# Patient Record
Sex: Female | Born: 1945
Health system: Southern US, Community
[De-identification: ages and names within clinical notes are randomized; demographics above are authoritative.]

## PROBLEM LIST (undated history)

## (undated) DIAGNOSIS — I050 Rheumatic mitral stenosis: Secondary | ICD-10-CM

## (undated) DIAGNOSIS — D75829 Heparin-induced thrombocytopenia, unspecified: Secondary | ICD-10-CM

## (undated) DIAGNOSIS — M1990 Unspecified osteoarthritis, unspecified site: Secondary | ICD-10-CM

## (undated) DIAGNOSIS — D7582 Heparin induced thrombocytopenia (HIT): Secondary | ICD-10-CM

## (undated) DIAGNOSIS — D6862 Lupus anticoagulant syndrome: Secondary | ICD-10-CM

## (undated) DIAGNOSIS — I5030 Unspecified diastolic (congestive) heart failure: Secondary | ICD-10-CM

## (undated) DIAGNOSIS — I743 Embolism and thrombosis of arteries of the lower extremities: Secondary | ICD-10-CM

## (undated) DIAGNOSIS — I2699 Other pulmonary embolism without acute cor pulmonale: Secondary | ICD-10-CM

## (undated) DIAGNOSIS — I10 Essential (primary) hypertension: Secondary | ICD-10-CM

## (undated) DIAGNOSIS — I63511 Cerebral infarction due to unspecified occlusion or stenosis of right middle cerebral artery: Secondary | ICD-10-CM

## (undated) DIAGNOSIS — I509 Heart failure, unspecified: Secondary | ICD-10-CM

## (undated) DIAGNOSIS — F329 Major depressive disorder, single episode, unspecified: Secondary | ICD-10-CM

## (undated) DIAGNOSIS — Z86718 Personal history of other venous thrombosis and embolism: Secondary | ICD-10-CM

## (undated) DIAGNOSIS — F32A Depression, unspecified: Secondary | ICD-10-CM

## (undated) DIAGNOSIS — I4821 Permanent atrial fibrillation: Secondary | ICD-10-CM

## (undated) DIAGNOSIS — D2122 Benign neoplasm of connective and other soft tissue of left lower limb, including hip: Secondary | ICD-10-CM

## (undated) DIAGNOSIS — E669 Obesity, unspecified: Secondary | ICD-10-CM

## (undated) HISTORY — DX: Major depressive disorder, single episode, unspecified: F32.9

## (undated) HISTORY — DX: Depression, unspecified: F32.A

## (undated) HISTORY — DX: Obesity, unspecified: E66.9

## (undated) HISTORY — DX: Heparin-induced thrombocytopenia, unspecified: D75.829

## (undated) HISTORY — DX: Cerebral infarction due to unspecified occlusion or stenosis of right middle cerebral artery: I63.511

## (undated) HISTORY — DX: Heparin induced thrombocytopenia (HIT): D75.82

## (undated) HISTORY — DX: Heart failure, unspecified: I50.9

## (undated) HISTORY — PX: OTHER SURGICAL HISTORY: SHX169

## (undated) HISTORY — DX: Essential (primary) hypertension: I10

## (undated) HISTORY — DX: Other pulmonary embolism without acute cor pulmonale: I26.99

---

## 2005-08-11 ENCOUNTER — Ambulatory Visit (HOSPITAL_COMMUNITY): Admission: RE | Admit: 2005-08-11 | Discharge: 2005-08-11 | Payer: Self-pay | Admitting: Internal Medicine

## 2005-08-23 ENCOUNTER — Ambulatory Visit (HOSPITAL_COMMUNITY): Admission: RE | Admit: 2005-08-23 | Discharge: 2005-08-23 | Payer: Self-pay | Admitting: Internal Medicine

## 2005-08-29 ENCOUNTER — Ambulatory Visit: Payer: Self-pay | Admitting: Cardiology

## 2005-08-29 ENCOUNTER — Inpatient Hospital Stay (HOSPITAL_COMMUNITY): Admission: EM | Admit: 2005-08-29 | Discharge: 2005-09-07 | Payer: Self-pay | Admitting: Emergency Medicine

## 2005-08-30 ENCOUNTER — Encounter: Payer: Self-pay | Admitting: Cardiology

## 2005-08-30 ENCOUNTER — Encounter: Payer: Self-pay | Admitting: Vascular Surgery

## 2005-08-30 DIAGNOSIS — Z86711 Personal history of pulmonary embolism: Secondary | ICD-10-CM

## 2005-09-01 ENCOUNTER — Ambulatory Visit: Payer: Self-pay | Admitting: Internal Medicine

## 2005-09-09 ENCOUNTER — Ambulatory Visit: Payer: Self-pay | Admitting: Cardiology

## 2005-09-16 ENCOUNTER — Ambulatory Visit: Payer: Self-pay | Admitting: Cardiovascular Disease

## 2005-09-23 ENCOUNTER — Ambulatory Visit: Payer: Self-pay | Admitting: Cardiology

## 2005-09-30 ENCOUNTER — Ambulatory Visit: Payer: Self-pay | Admitting: Internal Medicine

## 2005-10-03 ENCOUNTER — Ambulatory Visit: Payer: Self-pay | Admitting: Cardiology

## 2005-10-10 ENCOUNTER — Ambulatory Visit: Payer: Self-pay | Admitting: Cardiology

## 2005-10-10 ENCOUNTER — Ambulatory Visit (HOSPITAL_COMMUNITY): Admission: RE | Admit: 2005-10-10 | Discharge: 2005-10-10 | Payer: Self-pay | Admitting: Cardiology

## 2005-10-14 ENCOUNTER — Ambulatory Visit: Payer: Self-pay | Admitting: Cardiology

## 2005-11-11 ENCOUNTER — Ambulatory Visit: Payer: Self-pay | Admitting: Internal Medicine

## 2005-11-25 ENCOUNTER — Ambulatory Visit: Payer: Self-pay | Admitting: Cardiology

## 2005-12-16 ENCOUNTER — Ambulatory Visit: Payer: Self-pay | Admitting: Cardiovascular Disease

## 2006-01-16 ENCOUNTER — Ambulatory Visit: Payer: Self-pay | Admitting: Cardiovascular Disease

## 2006-01-26 ENCOUNTER — Ambulatory Visit: Payer: Self-pay | Admitting: Cardiology

## 2006-02-01 ENCOUNTER — Ambulatory Visit: Payer: Self-pay | Admitting: Cardiology

## 2006-02-15 ENCOUNTER — Ambulatory Visit: Payer: Self-pay | Admitting: Cardiology

## 2006-03-08 ENCOUNTER — Ambulatory Visit: Payer: Self-pay | Admitting: *Deleted

## 2006-03-16 ENCOUNTER — Ambulatory Visit: Payer: Self-pay | Admitting: Cardiology

## 2006-03-27 ENCOUNTER — Ambulatory Visit: Payer: Self-pay | Admitting: Cardiology

## 2006-04-17 ENCOUNTER — Ambulatory Visit: Payer: Self-pay | Admitting: Cardiology

## 2006-06-05 ENCOUNTER — Ambulatory Visit: Payer: Self-pay | Admitting: Cardiology

## 2006-06-19 ENCOUNTER — Ambulatory Visit: Payer: Self-pay | Admitting: Cardiology

## 2006-07-03 ENCOUNTER — Ambulatory Visit: Payer: Self-pay | Admitting: Cardiology

## 2006-07-10 ENCOUNTER — Ambulatory Visit: Payer: Self-pay | Admitting: Cardiology

## 2006-07-24 ENCOUNTER — Ambulatory Visit: Payer: Self-pay | Admitting: Cardiology

## 2006-08-04 ENCOUNTER — Ambulatory Visit: Payer: Self-pay | Admitting: Cardiology

## 2006-08-11 ENCOUNTER — Ambulatory Visit: Payer: Self-pay | Admitting: Cardiology

## 2006-08-18 ENCOUNTER — Ambulatory Visit: Payer: Self-pay | Admitting: Internal Medicine

## 2006-09-01 ENCOUNTER — Ambulatory Visit: Payer: Self-pay | Admitting: Cardiology

## 2006-09-04 ENCOUNTER — Ambulatory Visit: Payer: Self-pay | Admitting: Cardiology

## 2006-09-15 ENCOUNTER — Ambulatory Visit: Payer: Self-pay | Admitting: Cardiology

## 2006-09-22 ENCOUNTER — Ambulatory Visit: Payer: Self-pay

## 2006-09-22 ENCOUNTER — Encounter: Payer: Self-pay | Admitting: Cardiology

## 2006-09-22 ENCOUNTER — Ambulatory Visit: Payer: Self-pay | Admitting: Internal Medicine

## 2006-10-06 ENCOUNTER — Ambulatory Visit: Payer: Self-pay | Admitting: Cardiology

## 2006-11-30 ENCOUNTER — Inpatient Hospital Stay (HOSPITAL_COMMUNITY): Admission: EM | Admit: 2006-11-30 | Discharge: 2006-12-04 | Payer: Self-pay | Admitting: Emergency Medicine

## 2007-04-16 ENCOUNTER — Ambulatory Visit: Payer: Self-pay | Admitting: Cardiovascular Disease

## 2007-04-27 ENCOUNTER — Ambulatory Visit: Payer: Self-pay | Admitting: Cardiology

## 2007-05-04 ENCOUNTER — Ambulatory Visit: Payer: Self-pay | Admitting: Cardiology

## 2007-05-11 ENCOUNTER — Ambulatory Visit: Payer: Self-pay | Admitting: Internal Medicine

## 2007-05-22 ENCOUNTER — Ambulatory Visit: Payer: Self-pay | Admitting: Cardiology

## 2007-06-19 ENCOUNTER — Ambulatory Visit: Payer: Self-pay | Admitting: Cardiovascular Disease

## 2007-06-28 ENCOUNTER — Ambulatory Visit: Payer: Self-pay | Admitting: Internal Medicine

## 2007-07-09 ENCOUNTER — Ambulatory Visit: Payer: Self-pay | Admitting: Cardiology

## 2007-07-23 ENCOUNTER — Ambulatory Visit: Payer: Self-pay | Admitting: Cardiovascular Disease

## 2007-08-08 ENCOUNTER — Ambulatory Visit: Payer: Self-pay | Admitting: Cardiology

## 2007-08-31 ENCOUNTER — Ambulatory Visit: Payer: Self-pay | Admitting: Cardiovascular Disease

## 2007-09-21 ENCOUNTER — Ambulatory Visit: Payer: Self-pay | Admitting: Cardiology

## 2007-10-11 ENCOUNTER — Ambulatory Visit: Payer: Self-pay | Admitting: Cardiovascular Disease

## 2008-03-07 DIAGNOSIS — I4821 Permanent atrial fibrillation: Secondary | ICD-10-CM

## 2008-04-04 ENCOUNTER — Ambulatory Visit: Payer: Self-pay | Admitting: Cardiology

## 2008-04-10 ENCOUNTER — Ambulatory Visit: Payer: Self-pay | Admitting: Cardiovascular Disease

## 2008-04-25 ENCOUNTER — Ambulatory Visit: Payer: Self-pay | Admitting: Cardiovascular Disease

## 2008-05-05 ENCOUNTER — Ambulatory Visit: Payer: Self-pay | Admitting: Cardiology

## 2008-05-30 ENCOUNTER — Ambulatory Visit: Payer: Self-pay | Admitting: Internal Medicine

## 2008-06-06 ENCOUNTER — Ambulatory Visit: Payer: Self-pay | Admitting: Cardiovascular Disease

## 2008-07-22 ENCOUNTER — Other Ambulatory Visit: Payer: Self-pay | Admitting: Emergency Medicine

## 2008-07-22 ENCOUNTER — Ambulatory Visit: Payer: Self-pay | Admitting: Cardiology

## 2008-07-22 ENCOUNTER — Ambulatory Visit: Payer: Self-pay | Admitting: Vascular Surgery

## 2008-07-22 ENCOUNTER — Encounter (INDEPENDENT_AMBULATORY_CARE_PROVIDER_SITE_OTHER): Payer: Self-pay | Admitting: Emergency Medicine

## 2008-07-22 ENCOUNTER — Inpatient Hospital Stay (HOSPITAL_COMMUNITY): Admission: EM | Admit: 2008-07-22 | Discharge: 2008-07-28 | Payer: Self-pay | Admitting: Emergency Medicine

## 2008-07-22 ENCOUNTER — Encounter: Payer: Self-pay | Admitting: Vascular Surgery

## 2008-07-23 ENCOUNTER — Encounter: Payer: Self-pay | Admitting: Vascular Surgery

## 2008-08-01 ENCOUNTER — Ambulatory Visit: Payer: Self-pay | Admitting: Cardiovascular Disease

## 2008-08-04 ENCOUNTER — Ambulatory Visit: Payer: Self-pay | Admitting: Surgery

## 2008-08-06 ENCOUNTER — Ambulatory Visit: Payer: Self-pay | Admitting: Cardiology

## 2008-08-14 ENCOUNTER — Ambulatory Visit: Payer: Self-pay | Admitting: Cardiology

## 2008-08-14 ENCOUNTER — Ambulatory Visit: Payer: Self-pay

## 2008-08-14 ENCOUNTER — Ambulatory Visit: Payer: Self-pay | Admitting: Internal Medicine

## 2008-08-14 DIAGNOSIS — E669 Obesity, unspecified: Secondary | ICD-10-CM | POA: Insufficient documentation

## 2008-08-14 DIAGNOSIS — E66813 Obesity, class 3: Secondary | ICD-10-CM | POA: Insufficient documentation

## 2008-08-15 ENCOUNTER — Ambulatory Visit: Payer: Self-pay | Admitting: Vascular Surgery

## 2008-08-18 ENCOUNTER — Telehealth: Payer: Self-pay | Admitting: Cardiology

## 2008-08-26 ENCOUNTER — Encounter: Payer: Self-pay | Admitting: *Deleted

## 2008-08-29 ENCOUNTER — Ambulatory Visit: Payer: Self-pay | Admitting: Vascular Surgery

## 2008-08-29 ENCOUNTER — Ambulatory Visit: Payer: Self-pay | Admitting: Cardiology

## 2008-08-29 LAB — CONVERTED CEMR LAB: POC INR: 5.1

## 2008-09-12 ENCOUNTER — Ambulatory Visit: Payer: Self-pay | Admitting: Internal Medicine

## 2008-09-12 ENCOUNTER — Ambulatory Visit: Payer: Self-pay | Admitting: Vascular Surgery

## 2008-09-12 ENCOUNTER — Encounter (INDEPENDENT_AMBULATORY_CARE_PROVIDER_SITE_OTHER): Payer: Self-pay | Admitting: Cardiology

## 2008-10-01 ENCOUNTER — Encounter: Payer: Self-pay | Admitting: *Deleted

## 2008-10-03 ENCOUNTER — Ambulatory Visit: Payer: Self-pay | Admitting: Cardiology

## 2008-10-03 LAB — CONVERTED CEMR LAB
POC INR: 1.2
Prothrombin Time: 13.8 s

## 2008-10-13 ENCOUNTER — Ambulatory Visit: Payer: Self-pay | Admitting: Cardiovascular Disease

## 2008-10-13 LAB — CONVERTED CEMR LAB
POC INR: 1.2
Prothrombin Time: 13.5 s

## 2008-10-23 ENCOUNTER — Ambulatory Visit: Payer: Self-pay | Admitting: Internal Medicine

## 2008-10-23 LAB — CONVERTED CEMR LAB: Prothrombin Time: 15.6 s

## 2008-10-31 ENCOUNTER — Ambulatory Visit: Payer: Self-pay | Admitting: Vascular Surgery

## 2008-11-03 ENCOUNTER — Ambulatory Visit: Payer: Self-pay | Admitting: Internal Medicine

## 2008-11-03 LAB — CONVERTED CEMR LAB
POC INR: 2.4
Prothrombin Time: 19 s

## 2008-11-04 ENCOUNTER — Telehealth: Payer: Self-pay | Admitting: Cardiology

## 2008-11-24 ENCOUNTER — Ambulatory Visit: Payer: Self-pay | Admitting: Cardiovascular Disease

## 2008-11-28 ENCOUNTER — Encounter (INDEPENDENT_AMBULATORY_CARE_PROVIDER_SITE_OTHER): Payer: Self-pay | Admitting: *Deleted

## 2009-01-02 ENCOUNTER — Ambulatory Visit: Payer: Self-pay | Admitting: Vascular Surgery

## 2009-01-21 ENCOUNTER — Encounter (INDEPENDENT_AMBULATORY_CARE_PROVIDER_SITE_OTHER): Payer: Self-pay | Admitting: Cardiology

## 2009-02-25 ENCOUNTER — Encounter: Payer: Self-pay | Admitting: Cardiology

## 2009-03-07 ENCOUNTER — Emergency Department (HOSPITAL_BASED_OUTPATIENT_CLINIC_OR_DEPARTMENT_OTHER): Admission: EM | Admit: 2009-03-07 | Discharge: 2009-03-07 | Payer: Self-pay | Admitting: Emergency Medicine

## 2009-03-07 ENCOUNTER — Ambulatory Visit: Payer: Self-pay | Admitting: Diagnostic Radiology

## 2009-03-10 ENCOUNTER — Telehealth (INDEPENDENT_AMBULATORY_CARE_PROVIDER_SITE_OTHER): Payer: Self-pay | Admitting: *Deleted

## 2009-03-11 ENCOUNTER — Ambulatory Visit: Payer: Self-pay | Admitting: Cardiology

## 2009-03-11 LAB — CONVERTED CEMR LAB
INR: 3.1
POC INR: 3.1

## 2009-03-12 ENCOUNTER — Ambulatory Visit (HOSPITAL_BASED_OUTPATIENT_CLINIC_OR_DEPARTMENT_OTHER): Admission: RE | Admit: 2009-03-12 | Discharge: 2009-03-12 | Payer: Self-pay | Admitting: Orthopedic Surgery

## 2009-04-03 ENCOUNTER — Ambulatory Visit: Payer: Self-pay | Admitting: Cardiology

## 2009-04-03 LAB — CONVERTED CEMR LAB: POC INR: 1.4

## 2009-04-10 ENCOUNTER — Ambulatory Visit: Payer: Self-pay | Admitting: Internal Medicine

## 2009-04-10 LAB — CONVERTED CEMR LAB: POC INR: 2.9

## 2009-05-01 ENCOUNTER — Ambulatory Visit: Payer: Self-pay | Admitting: Internal Medicine

## 2009-05-01 LAB — CONVERTED CEMR LAB: POC INR: 2.8

## 2009-05-29 ENCOUNTER — Ambulatory Visit: Payer: Self-pay | Admitting: Cardiology

## 2009-05-29 LAB — CONVERTED CEMR LAB: POC INR: 2.1

## 2009-06-05 ENCOUNTER — Ambulatory Visit: Payer: Self-pay | Admitting: Cardiology

## 2009-06-26 ENCOUNTER — Ambulatory Visit: Payer: Self-pay | Admitting: Internal Medicine

## 2009-06-26 LAB — CONVERTED CEMR LAB: POC INR: 2.5

## 2009-07-24 ENCOUNTER — Ambulatory Visit: Payer: Self-pay | Admitting: Cardiology

## 2009-08-14 ENCOUNTER — Ambulatory Visit: Payer: Self-pay | Admitting: Internal Medicine

## 2009-08-14 LAB — CONVERTED CEMR LAB: POC INR: 2.2

## 2009-09-11 ENCOUNTER — Ambulatory Visit: Payer: Self-pay | Admitting: Cardiology

## 2009-09-11 LAB — CONVERTED CEMR LAB: POC INR: 2

## 2009-10-09 ENCOUNTER — Ambulatory Visit: Payer: Self-pay | Admitting: Cardiology

## 2009-10-09 LAB — CONVERTED CEMR LAB: POC INR: 2.2

## 2009-11-06 ENCOUNTER — Ambulatory Visit: Payer: Self-pay | Admitting: Cardiology

## 2009-11-06 LAB — CONVERTED CEMR LAB: POC INR: 1.9

## 2009-12-08 ENCOUNTER — Ambulatory Visit: Payer: Self-pay | Admitting: Cardiovascular Disease

## 2009-12-08 LAB — CONVERTED CEMR LAB: POC INR: 2.2

## 2010-01-08 ENCOUNTER — Ambulatory Visit: Payer: Self-pay | Admitting: Cardiology

## 2010-02-05 ENCOUNTER — Ambulatory Visit: Payer: Self-pay | Admitting: Cardiology

## 2010-03-05 ENCOUNTER — Ambulatory Visit: Payer: Self-pay | Admitting: Internal Medicine

## 2010-03-05 LAB — CONVERTED CEMR LAB: POC INR: 1.5

## 2010-03-19 ENCOUNTER — Ambulatory Visit: Payer: Self-pay | Admitting: Cardiology

## 2010-04-09 ENCOUNTER — Ambulatory Visit: Admission: RE | Admit: 2010-04-09 | Discharge: 2010-04-09 | Payer: Self-pay | Source: Home / Self Care

## 2010-04-09 LAB — CONVERTED CEMR LAB: POC INR: 2.5

## 2010-04-13 ENCOUNTER — Ambulatory Visit: Admit: 2010-04-13 | Payer: Self-pay | Admitting: Vascular Surgery

## 2010-04-13 ENCOUNTER — Ambulatory Visit
Admission: RE | Admit: 2010-04-13 | Discharge: 2010-04-13 | Payer: Self-pay | Source: Home / Self Care | Attending: Vascular Surgery | Admitting: Vascular Surgery

## 2010-04-27 NOTE — Medication Information (Signed)
Summary: rov/sp  Anticoagulant Therapy  Managed by: Reina Fuse, PharmD Referring MD: Rollene Rotunda MD PCP: Johnella Moloney, MD Supervising MD: Jens Som MD, Arlys John Indication 1: Atrial Fibrillation (ICD-427.31) Indication 2: Deep Vein Thrombosis - Leg (ICD-451.1) Lab Used: [facility] Site: [facility] INR POC 1.9 INR RANGE 2 - 3  Dietary changes: no    Health status changes: no    Bleeding/hemorrhagic complications: no    Recent/future hospitalizations: no    Any changes in medication regimen? no    Recent/future dental: no  Any missed doses?: yes     Details: Missed Wednesday's dose (1/2 tab).  Is patient compliant with meds? yes       Current Medications (verified): 1)  Lasix 20 Mg Tabs (Furosemide) .... Take 1 Tablet By Mouth Twice A Day 2)  Metoprolol Tartrate 25 Mg Tabs (Metoprolol Tartrate) .... 1/2 Tab By Mouth Two Times A Day 3)  Aspirin 81 Mg Tbec (Aspirin) .... Take One Tablet By Mouth Daily 4)  Cartia Xt 240 Mg Xr24h-Cap (Diltiazem Hcl Coated Beads) .Marland Kitchen.. 1 By Mouth Daily 5)  Potassium Chloride Crys Cr 20 Meq Cr-Tabs (Potassium Chloride Crys Cr) .... Take One Tablet By Mouth Two Times A Day 6)  Coumadin 6 Mg Tabs (Warfarin Sodium) .... Take As Directed By Coumadin Clinic.  Allergies (verified): 1)  ! Heparin  Anticoagulation Management History:      The patient is taking warfarin and comes in today for a routine follow up visit.  Negative risk factors for bleeding include an age less than 38 years old.  The bleeding index is 'low risk'.  Negative CHADS2 values include Age > 35 years old.  The start date was 09/05/2005.  Her last INR was 3.1.  Anticoagulation responsible provider: Jens Som MD, Arlys John.  INR POC: 1.9.  Cuvette Lot#: [id number].  Exp: 11/2010.    Anticoagulation Management Assessment/Plan:      The patient's current anticoagulation dose is Coumadin 6 mg tabs: Take as directed by coumadin clinic..  The target INR is 2 - 3.  The next INR is due  12/04/2009.  Anticoagulation instructions were given to patient.  Results were reviewed/authorized by Reina Fuse, PharmD.  She was notified by Reina Fuse PharmD.         Prior Anticoagulation Instructions: INR 2.2  Continue same dose of 1 tablet every day except 1/2 tablet on Wednesday and Friday.   Current Anticoagulation Instructions: INR 1.9   Today, Friday, August 12th, take Coumadin 1 tab (6 mg).  Then, continue taking Coumadin 1 tab (6 mg) on Sun, Mon, Tues, Thur, Sat and Coumadin 0.5 tab (3 mg) on Wed and Fri.  Return to clinic in 4 weeks.

## 2010-04-27 NOTE — Medication Information (Signed)
Summary: rov/ewj  Anticoagulant Therapy  Managed by: Bethena Midget, RN, BSN Referring MD: Rollene Rotunda MD PCP: Johnella Moloney, MD Supervising MD: Jens Som MD, Arlys John Indication 1: Atrial Fibrillation (ICD-427.31) Indication 2: Deep Vein Thrombosis - Leg (ICD-451.1) Lab Used: [facility] Site: [facility] INR POC 1.7 INR RANGE 2 - 3  Dietary changes: no    Health status changes: no    Bleeding/hemorrhagic complications: no    Recent/future hospitalizations: no    Any changes in medication regimen? no    Recent/future dental: no  Any missed doses?: yes     Details: Missed Wednesdays dose  Is patient compliant with meds? yes       Allergies: 1)  ! Heparin  Anticoagulation Management History:      The patient is taking warfarin and comes in today for a routine follow up visit.  Negative risk factors for bleeding include an age less than 30 years old.  The bleeding index is 'low risk'.  Negative CHADS2 values include Age > 32 years old.  The start date was 09/05/2005.  Her last INR was 3.1.  Anticoagulation responsible provider: Jens Som MD, Arlys John.  INR POC: 1.7.  Cuvette Lot#: [id number].  Exp: 02/2011.    Anticoagulation Management Assessment/Plan:      The patient's current anticoagulation dose is Coumadin 6 mg tabs: Take as directed by coumadin clinic..  The target INR is 2 - 3.  The next INR is due 03/05/2010.  Anticoagulation instructions were given to patient.  Results were reviewed/authorized by Bethena Midget, RN, BSN.  She was notified by Bethena Midget, RN, BSN.         Prior Anticoagulation Instructions: INR 2.1  Continue on same dosage 1 tablet daily except 1/2 tablet on Wednesdays and Fridays.  Recheck in 4 weeks.  Current Anticoagulation Instructions: INR 1.7 Today take 6mg s then resume 6mg s everyday except 3mg s on Wednesdays and Fridays. REcheck in 4 weeks.

## 2010-04-27 NOTE — Medication Information (Signed)
Summary: rov/tp  Anticoagulant Therapy  Managed by: Geoffry Paradise, PharmD Referring MD: Rollene Rotunda MD PCP: Johnella Moloney, MD Supervising MD: Johney Frame MD, Fayrene Fearing Indication 1: Atrial Fibrillation (ICD-427.31) Indication 2: Deep Vein Thrombosis - Leg (ICD-451.1) Lab Used: [facility] Site: [facility] INR POC 1.5 INR RANGE 2 - 3  Dietary changes: no    Health status changes: no    Bleeding/hemorrhagic complications: no    Recent/future hospitalizations: no    Any changes in medication regimen? no    Recent/future dental: no  Any missed doses?: yes     Details: Misses 1 dose last night (12/08)  Is patient compliant with meds? yes       Allergies: 1)  ! Heparin  Anticoagulation Management History:      Negative risk factors for bleeding include an age less than 18 years old.  The bleeding index is 'low risk'.  Negative CHADS2 values include Age > 43 years old.  The start date was 09/05/2005.  Her last INR was 3.1.  Anticoagulation responsible provider: Allred MD, Fayrene Fearing.  INR POC: 1.5.  Cuvette Lot#: [id number].  Exp: 04/2011.    Anticoagulation Management Assessment/Plan:      The patient's current anticoagulation dose is Coumadin 6 mg tabs: Take as directed by coumadin clinic..  The target INR is 2 - 3.  The next INR is due 03/19/2010.  Anticoagulation instructions were given to patient.  Results were reviewed/authorized by Geoffry Paradise, PharmD.         Prior Anticoagulation Instructions: INR 1.7 Today take 6mg s then resume 6mg s everyday except 3mg s on Wednesdays and Fridays. REcheck in 4 weeks.   Current Anticoagulation Instructions: INR:  1.5  Your INR is low today but you missed last night's dose.  Please take 6 mg (1 tablet) tonight and 9 mg (1.5 tablet) tomorrow night then return to your normal schedule.   Please return in 2 weeks for another INR check.

## 2010-04-27 NOTE — Medication Information (Signed)
Summary: rov/sl  Anticoagulant Therapy  Managed by: Lyna Poser, PharmD Referring MD: Rollene Rotunda MD PCP: Johnella Moloney, MD Supervising MD: Clifton James MD,Paulette Lynch Indication 1: Atrial Fibrillation (ICD-427.31) Indication 2: Deep Vein Thrombosis - Leg (ICD-451.1) Lab Used: [facility] Site: [facility] INR POC 2.2 INR RANGE 2 - 3  Dietary changes: no    Health status changes: no    Bleeding/hemorrhagic complications: no    Recent/future hospitalizations: no    Any changes in medication regimen? no    Recent/future dental: no  Any missed doses?: no       Is patient compliant with meds? yes       Allergies: 1)  ! Heparin  Anticoagulation Management History:      The patient is taking warfarin and comes in today for a routine follow up visit.  Negative risk factors for bleeding include an age less than 24 years old.  The bleeding index is 'low risk'.  Negative CHADS2 values include Age > 42 years old.  The start date was 09/05/2005.  Her last INR was 3.1.  Anticoagulation responsible provider: Clifton James MD,Kaleb Linquist.  INR POC: 2.2.  Cuvette Lot#: [id number].  Exp: 11/2010.    Anticoagulation Management Assessment/Plan:      The patient's current anticoagulation dose is Coumadin 6 mg tabs: Take as directed by coumadin clinic..  The target INR is 2 - 3.  The next INR is due 01/08/2010.  Anticoagulation instructions were given to patient.  Results were reviewed/authorized by Lyna Poser, PharmD.         Prior Anticoagulation Instructions: INR 1.9   Today, Friday, August 12th, take Coumadin 1 tab (6 mg).  Then, continue taking Coumadin 1 tab (6 mg) on Sun, Mon, Tues, Thur, Sat and Coumadin 0.5 tab (3 mg) on Wed and Fri.  Return to clinic in 4 weeks.   Current Anticoagulation Instructions: INR 2.2 No changes today. Continue taking 1/2 tablet on wednesday and friday. Take 1 tablet all other days. See you in 4 weeks.

## 2010-04-27 NOTE — Medication Information (Signed)
Summary: rov/eh  Anticoagulant Therapy  Managed by: Bethena Midget, RN, BSN Referring MD: Rollene Rotunda MD PCP: Kevan Ny, MD Supervising MD: Johney Frame MD, Fayrene Fearing Indication 1: Atrial Fibrillation (ICD-427.31) Indication 2: Deep Vein Thrombosis - Leg (ICD-451.1) Lab Used: [facility] Site: [facility] INR POC 2.8 INR RANGE 2 - 3  Dietary changes: no    Health status changes: no    Bleeding/hemorrhagic complications: no    Recent/future hospitalizations: no    Any changes in medication regimen? no    Recent/future dental: no  Any missed doses?: no       Is patient compliant with meds? yes       Allergies: 1)  ! Heparin  Anticoagulation Management History:      The patient is taking warfarin and comes in today for a routine follow up visit.  Negative risk factors for bleeding include an age less than 85 years old.  The bleeding index is 'low risk'.  Negative CHADS2 values include Age > 28 years old.  The start date was 09/05/2005.  Her last INR was 3.1.  Anticoagulation responsible provider: Danniell Rotundo MD, Fayrene Fearing.  INR POC: 2.8.  Cuvette Lot#: [id number].  Exp: 06/2010.    Anticoagulation Management Assessment/Plan:      The patient's current anticoagulation dose is Coumadin 6 mg tabs: Take as directed by coumadin clinic..  The target INR is 2 - 3.  The next INR is due 05/29/2009.  Anticoagulation instructions were given to patient.  Results were reviewed/authorized by Bethena Midget, RN, BSN.  She was notified by Bethena Midget, RN, BSN.         Prior Anticoagulation Instructions: INR 2.9  Continue same dose of 1 tablet daily except 0.5 tablet on Wednesdays and Fridays. Recheck in 3 weeks.   Current Anticoagulation Instructions: INR 2.8 Continue 6mg s everyday except 3mg s on Wednesdays and Fridays. Recheck in 4 weeks

## 2010-04-27 NOTE — Medication Information (Signed)
Summary: rov/td  Anticoagulant Therapy  Managed by: Bethanne Ginger, PharmD Referring MD: Rollene Rotunda MD PCP: Kevan Ny, MD Supervising MD: Antoine Poche MD, Fayrene Fearing Indication 1: Atrial Fibrillation (ICD-427.31) Indication 2: Deep Vein Thrombosis - Leg (ICD-451.1) Lab Used: [facility] Site: [facility] INR POC 1.4 INR RANGE 2 - 3  Dietary changes: no    Health status changes: no    Bleeding/hemorrhagic complications: no    Recent/future hospitalizations: no    Any changes in medication regimen? no    Recent/future dental: no  Any missed doses?: yes     Details: Has not had any coumadin since Wednesday, ran out of coumadin, past due for f/u with Dr Antoine Poche pharmacy advised pt Dr Antoine Poche denied refill d/t.  No evidence in EMR of refill request.    Is patient compliant with meds? yes       Allergies (verified): 1)  ! Heparin  Anticoagulation Management History:      The patient is taking warfarin and comes in today for a routine follow up visit.  Negative risk factors for bleeding include an age less than 106 years old.  The bleeding index is 'low risk'.  Negative CHADS2 values include Age > 25 years old.  The start date was 09/05/2005.  Her last INR was 3.1.  Anticoagulation responsible provider: Antoine Poche MD, Fayrene Fearing.  INR POC: 1.4.  Cuvette Lot#: [id number].  Exp: 04/2010.    Anticoagulation Management Assessment/Plan:      The patient's current anticoagulation dose is Coumadin 6 mg tabs: Take as directed by coumadin clinic..  The target INR is 2 - 3.  The next INR is due 04/10/2009.  Anticoagulation instructions were given to patient.  Results were reviewed/authorized by Bethanne Ginger, PharmD.  She was notified by Cloyde Reams RN.         Prior Anticoagulation Instructions: Hold tonights dose then: 1 tab - Sun, Mon, Tue, Thur, Sat 1/2 tab - Wed, Fri  Current Anticoagulation Instructions: INR 1.4  Take 1.5 tablets today then resume same dosage 1 tablet daily except 1/2 tablet  on Wednesdays and Fridays.  Recheck in 10 days.   Prescriptions: COUMADIN 6 MG TABS (WARFARIN SODIUM) Take as directed by coumadin clinic.  #30 x 2   Entered by:   Cloyde Reams RN   Authorized by:   Rollene Rotunda, MD, [facility]   Signed by:   Cloyde Reams RN on 04/03/2009   Method used:   Electronically to        [id number] (retail)       [address]       [place], [place]  [address]       Ph: [phone] or [phone]       Fax: [phone]   RxID:   [id number]

## 2010-04-27 NOTE — Assessment & Plan Note (Signed)
Summary: 6 MO F/U   Visit Type:  Follow-up Primary Provider:  Johnella Moloney, MD  CC:  Atrial Fibrillation.  History of Present Illness: The patient presents for followup of her atrial fibrillation. Since I last saw her she broke her hand and required surgical repair of this. This was done on Coumadin so she did not need bridging anticoagulation. From a cardiovascular standpoint she has done well. She has occasional palpitations particularly at night. These are not very bothersome and not associated with presyncope or syncope. She doesn't have chest pain or shortness of breath. She had a slow and steady weight gain and she thinks it's related to the sugar drinks that she has frequently had lack of exercise.  Current Medications (verified): 1)  Lasix 20 Mg Tabs (Furosemide) .... Take 1 Tablet By Mouth Twice A Day 2)  Metoprolol Tartrate 25 Mg Tabs (Metoprolol Tartrate) .... 1/2 Tab By Mouth Two Times A Day 3)  Aspirin 81 Mg Tbec (Aspirin) .... Take One Tablet By Mouth Daily 4)  Cartia Xt 300 Mg Xr24h-Cap (Diltiazem Hcl Coated Beads) .... Take One Tablet By Mouth Once Daily. 5)  Potassium Chloride Crys Cr 20 Meq Cr-Tabs (Potassium Chloride Crys Cr) .... Take One Tablet By Mouth Two Times A Day 6)  Coumadin 6 Mg Tabs (Warfarin Sodium) .... Take As Directed By Coumadin Clinic.  Allergies (verified): 1)  ! Heparin  Past History:  Past Medical History: LONG TERM ANTIPLATELET THERAPY (ICD-V58.63) ACUT VENUS EMBO&THROMB DEEP VES PROX LOWR EXTREM (ICD-453.41) PULMONARY EMBOLISM (ICD-415.19) ATRIAL FIBRILLATION (ICD-427.31) Lower extremity artial thrombosis  Past Surgical History: IVC Placement 08/30/2005 Open reduction and internal fixation of left intra-articular   distal radius fracture. (12,/16/2010)     Review of Systems       As stated in the HPI and negative for all other systems.   Vital Signs:  Patient profile:   65 year old female Height:      65 inches Weight:      259  pounds BMI:     43.26 Pulse rate:   85 / minute Resp:     16 per minute BP sitting:   144 / 94  (right arm)  Vitals Entered By: Marrion Coy, CNA (June 05, 2009 11:20 AM)  Physical Exam  General:  Well developed, well nourished, in no acute distress. Head:  normocephalic and atraumatic Eyes:  PERRLA/EOM intact; conjunctiva and lids normal. Mouth:   Oral mucosa normal. Neck:  Neck supple, no JVD. No masses, thyromegaly or abnormal cervical nodes. Chest Wall:  no deformities or breast masses noted Lungs:  Clear bilaterally to auscultation and percussion. Heart:  Non-displaced PMI, chest non-tender; irregular rate and rhythm, S1, S2 without murmurs, rubs or gallops. Carotid upstroke normal, no bruit. Normal abdominal aortic size, no bruits. Femorals normal pulses, no bruits. Pedals normal pulses. No edema, no varicosities. Abdomen:  Bowel sounds positive; abdomen soft and non-tender without masses, organomegaly, or hernias noted. No hepatosplenomegaly,obese Msk:  Back normal, normal gait. Muscle strength and tone normal. Extremities:  No clubbing or cyanosis. Neurologic:  Alert and oriented x 3. Skin:  Intact without lesions or rashes. Psych:  Normal affect.   EKG  Procedure date:  06/05/2009  Findings:      atrial fibrillation, rate 85, rightward axis, poor anterior R-wave progression, borderline QTC, no acute ST-T wave changes  Impression & Recommendations:  Problem # 1:  ATRIAL FIBRILLATION (ICD-427.31) She tolerates this rhythm with rate control and anticoagulation. No change in therapy  is indicated.  Orders: EKG w/ Interpretation (93000)  Problem # 2:  ACUT VENUS EMBO&THROMB DEEP VES PROX LOWR EXTREM (ICD-453.41) Certainly given her clotting history she would need bridging anticoagulation prior to procedures if she needs to come off Coumadin in the future.  Problem # 3:  OBESITY, UNSPECIFIED (ICD-278.00) We spent quite a bit of time discussing this. I gave her specific  instructions for weight loss with diet and exercise.  Patient Instructions: 1)  Your physician recommends that you schedule a follow-up appointment in: 1 year with Dr Antoine Poche 2)  Your physician recommends that you continue on your current medications as directed. Please refer to the Current Medication list given to you today.

## 2010-04-27 NOTE — Medication Information (Signed)
Summary: rov/tm  Anticoagulant Therapy  Managed by: Bethena Midget, RN, BSN Referring MD: Rollene Rotunda MD PCP: Johnella Moloney, MD Supervising MD: Tenny Craw MD, Gunnar Fusi Indication 1: Atrial Fibrillation (ICD-427.31) Indication 2: Deep Vein Thrombosis - Leg (ICD-451.1) Lab Used: [facility] Site: [facility] INR POC 2.2 INR RANGE 2 - 3  Dietary changes: no    Health status changes: no    Bleeding/hemorrhagic complications: no    Recent/future hospitalizations: no    Any changes in medication regimen? no    Recent/future dental: no  Any missed doses?: no       Is patient compliant with meds? yes       Allergies: 1)  ! Heparin  Anticoagulation Management History:      The patient is taking warfarin and comes in today for a routine follow up visit.  Negative risk factors for bleeding include an age less than 71 years old.  The bleeding index is 'low risk'.  Negative CHADS2 values include Age > 41 years old.  The start date was 09/05/2005.  Her last INR was 3.1.  Anticoagulation responsible provider: Tenny Craw MD, Gunnar Fusi.  INR POC: 2.2.  Cuvette Lot#: [id number].  Exp: 08/2010.    Anticoagulation Management Assessment/Plan:      The patient's current anticoagulation dose is Coumadin 6 mg tabs: Take as directed by coumadin clinic..  The target INR is 2 - 3.  The next INR is due 09/11/2009.  Anticoagulation instructions were given to patient.  Results were reviewed/authorized by Bethena Midget, RN, BSN.  She was notified by Bethena Midget, RN, BSN.         Prior Anticoagulation Instructions: INR 1.8 Today take 6mg s then resume 6mg s daily except 3mg s on Wednesdays and Fridays. Recheck in 3 weeks.   Current Anticoagulation Instructions: INR 2.2 Continue 6mg s everyday except 3mg s on Wednesdays and Fridays.  Recheck in 4 weeks/

## 2010-04-27 NOTE — Medication Information (Signed)
Summary: rov/ewj  Anticoagulant Therapy  Managed by: Elaina Pattee, PharmD Referring MD: Rollene Rotunda MD PCP: Johnella Moloney, MD Supervising MD: Tenny Craw MD, Gunnar Fusi Indication 1: Atrial Fibrillation (ICD-427.31) Indication 2: Deep Vein Thrombosis - Leg (ICD-451.1) Lab Used: [facility] Site: [facility] INR POC 2.5 INR RANGE 2 - 3  Dietary changes: no    Health status changes: no    Bleeding/hemorrhagic complications: no    Recent/future hospitalizations: no    Any changes in medication regimen? no    Recent/future dental: no  Any missed doses?: no       Is patient compliant with meds? yes       Allergies: 1)  ! Heparin  Anticoagulation Management History:      The patient is taking warfarin and comes in today for a routine follow up visit.  Negative risk factors for bleeding include an age less than 50 years old.  The bleeding index is 'low risk'.  Negative CHADS2 values include Age > 59 years old.  The start date was 09/05/2005.  Her last INR was 3.1.  Anticoagulation responsible provider: Tenny Craw MD, Gunnar Fusi.  INR POC: 2.5.  Cuvette Lot#: [id number].  Exp: 07/2010.    Anticoagulation Management Assessment/Plan:      The patient's current anticoagulation dose is Coumadin 6 mg tabs: Take as directed by coumadin clinic..  The target INR is 2 - 3.  The next INR is due 07/24/2009.  Anticoagulation instructions were given to patient.  Results were reviewed/authorized by Elaina Pattee, PharmD.  She was notified by Elaina Pattee, PharmD.         Prior Anticoagulation Instructions: INR 2.1  Continue on same dosage 1 tablet daily except 1/2 tablet on Wednesdays and Fridays.  Recheck in 4 weeks.    Current Anticoagulation Instructions: INR 2.5. Take 1 tablet daily except 0.5 tablet on Wed and Fri.

## 2010-04-27 NOTE — Medication Information (Signed)
Summary: rov/mw  Anticoagulant Therapy  Managed by: Cloyde Reams, RN, BSN Referring MD: Rollene Rotunda MD PCP: Johnella Moloney, MD Supervising MD: Riley Kill MD, Maisie Fus Indication 1: Atrial Fibrillation (ICD-427.31) Indication 2: Deep Vein Thrombosis - Leg (ICD-451.1) Lab Used: [facility] Site: [facility] INR POC 2.1 INR RANGE 2 - 3  Dietary changes: no    Health status changes: no    Bleeding/hemorrhagic complications: no    Recent/future hospitalizations: no    Any changes in medication regimen? no    Recent/future dental: no  Any missed doses?: no       Is patient compliant with meds? yes       Allergies: 1)  ! Heparin  Anticoagulation Management History:      The patient is taking warfarin and comes in today for a routine follow up visit.  Negative risk factors for bleeding include an age less than 7 years old.  The bleeding index is 'low risk'.  Negative CHADS2 values include Age > 95 years old.  The start date was 09/05/2005.  Her last INR was 3.1.  Anticoagulation responsible provider: Riley Kill MD, Maisie Fus.  INR POC: 2.1.  Cuvette Lot#: [id number].  Exp: 01/2011.    Anticoagulation Management Assessment/Plan:      The patient's current anticoagulation dose is Coumadin 6 mg tabs: Take as directed by coumadin clinic..  The target INR is 2 - 3.  The next INR is due 02/05/2010.  Anticoagulation instructions were given to patient.  Results were reviewed/authorized by Cloyde Reams, RN, BSN.  She was notified by Cloyde Reams RN.         Prior Anticoagulation Instructions: INR 2.2 No changes today. Continue taking 1/2 tablet on wednesday and friday. Take 1 tablet all other days. See you in 4 weeks.  Current Anticoagulation Instructions: INR 2.1  Continue on same dosage 1 tablet daily except 1/2 tablet on Wednesdays and Fridays.  Recheck in 4 weeks.

## 2010-04-27 NOTE — Medication Information (Signed)
Summary: rov/tm  Anticoagulant Therapy  Managed by: Weston Brass, PharmD Referring MD: Rollene Rotunda MD PCP: Johnella Moloney, MD Supervising MD: Eden Emms MD, Theron Arista Indication 1: Atrial Fibrillation (ICD-427.31) Indication 2: Deep Vein Thrombosis - Leg (ICD-451.1) Lab Used: [facility] Site: [facility] INR POC 2.2 INR RANGE 2 - 3  Dietary changes: no    Health status changes: no    Bleeding/hemorrhagic complications: yes       Details: nose bleeds x 2--stopped spontaneously--skipped 3mg  coumadin dose that day  Recent/future hospitalizations: no    Any changes in medication regimen? no    Recent/future dental: no  Any missed doses?: yes     Details: 3mg  tab last fri--due to nose bleed    Allergies: 1)  ! Heparin  Anticoagulation Management History:      The patient is taking warfarin and comes in today for a routine follow up visit.  Negative risk factors for bleeding include an age less than 87 years old.  The bleeding index is 'low risk'.  Negative CHADS2 values include Age > 56 years old.  The start date was 09/05/2005.  Her last INR was 3.1.  Anticoagulation responsible provider: Eden Emms MD, Theron Arista.  INR POC: 2.2.  Cuvette Lot#: [id number].  Exp: 11/2010.    Anticoagulation Management Assessment/Plan:      The patient's current anticoagulation dose is Coumadin 6 mg tabs: Take as directed by coumadin clinic..  The target INR is 2 - 3.  The next INR is due 11/06/2009.  Anticoagulation instructions were given to patient.  Results were reviewed/authorized by Weston Brass, PharmD.  She was notified by Weston Brass, PharmD.         Prior Anticoagulation Instructions: INR 2.0 Today take 6mg s then resume 6mg s everyday except 3mg s on Wednesdays and Fridays. Recheck in 4 weeks.   Current Anticoagulation Instructions: INR 2.2  Continue same dose of 1 tablet every day except 1/2 tablet on Wednesday and Friday.

## 2010-04-27 NOTE — Medication Information (Signed)
Summary: rov/cb  Anticoagulant Therapy  Managed by: Bethena Midget, RN, BSN Referring MD: Rollene Rotunda MD PCP: Johnella Moloney, MD Supervising MD: Tenny Craw MD, Gunnar Fusi Indication 1: Atrial Fibrillation (ICD-427.31) Indication 2: Deep Vein Thrombosis - Leg (ICD-451.1) Lab Used: [facility] Site: [facility] INR POC 1.8 INR RANGE 2 - 3  Dietary changes: yes       Details: Did have alittle more green veggies and green tea  Health status changes: no    Bleeding/hemorrhagic complications: no    Recent/future hospitalizations: no    Any changes in medication regimen? no    Recent/future dental: no  Any missed doses?: no       Is patient compliant with meds? yes       Allergies: 1)  ! Heparin  Anticoagulation Management History:      The patient is taking warfarin and comes in today for a routine follow up visit.  Negative risk factors for bleeding include an age less than 84 years old.  The bleeding index is 'low risk'.  Negative CHADS2 values include Age > 65 years old.  The start date was 09/05/2005.  Her last INR was 3.1.  Anticoagulation responsible provider: Tenny Craw MD, Gunnar Fusi.  INR POC: 1.8.  Cuvette Lot#: [id number].  Exp: 08/2010.    Anticoagulation Management Assessment/Plan:      The patient's current anticoagulation dose is Coumadin 6 mg tabs: Take as directed by coumadin clinic..  The target INR is 2 - 3.  The next INR is due 08/14/2009.  Anticoagulation instructions were given to patient.  Results were reviewed/authorized by Bethena Midget, RN, BSN.  She was notified by Bethena Midget, RN, BSN.         Prior Anticoagulation Instructions: INR 2.5. Take 1 tablet daily except 0.5 tablet on Wed and Fri.  Current Anticoagulation Instructions: INR 1.8 Today take 6mg s then resume 6mg s daily except 3mg s on Wednesdays and Fridays. Recheck in 3 weeks.

## 2010-04-27 NOTE — Medication Information (Signed)
Summary: rov/ewj  Anticoagulant Therapy  Managed by: Shelby Dubin, PharmD Referring MD: Rollene Rotunda MD PCP: Kevan Ny, MD Supervising MD: Gala Romney MD, Reuel Boom Indication 1: Atrial Fibrillation (ICD-427.31) Indication 2: Deep Vein Thrombosis - Leg (ICD-451.1) Lab Used: [facility] Site: [facility] INR POC 2.9 INR RANGE 2 - 3  Dietary changes: no    Health status changes: no    Bleeding/hemorrhagic complications: no    Recent/future hospitalizations: no    Any changes in medication regimen? no    Recent/future dental: no  Any missed doses?: no       Is patient compliant with meds? yes       Allergies: 1)  ! Heparin  Anticoagulation Management History:      The patient is taking warfarin and comes in today for a routine follow up visit.  Negative risk factors for bleeding include an age less than 15 years old.  The bleeding index is 'low risk'.  Negative CHADS2 values include Age > 81 years old.  The start date was 09/05/2005.  Her last INR was 3.1.  Anticoagulation responsible provider: Destine Ambroise MD, Reuel Boom.  INR POC: 2.9.  Cuvette Lot#: [id number].  Exp: 06/2010.    Anticoagulation Management Assessment/Plan:      The patient's current anticoagulation dose is Coumadin 6 mg tabs: Take as directed by coumadin clinic..  The target INR is 2 - 3.  The next INR is due 05/01/2009.  Anticoagulation instructions were given to patient.  Results were reviewed/authorized by Shelby Dubin, PharmD.  She was notified by Lew Dawes, PharmD Candidate.         Prior Anticoagulation Instructions: INR 1.4  Take 1.5 tablets today then resume same dosage 1 tablet daily except 1/2 tablet on Wednesdays and Fridays.  Recheck in 10 days.    Current Anticoagulation Instructions: INR 2.9  Continue same dose of 1 tablet daily except 0.5 tablet on Wednesdays and Fridays. Recheck in 3 weeks.

## 2010-04-27 NOTE — Medication Information (Signed)
Summary: rov/tm  Anticoagulant Therapy  Managed by: Bethena Midget, RN, BSN Referring MD: Rollene Rotunda MD PCP: Johnella Moloney, MD Supervising MD: Juanda Chance MD, Byrant Valent Indication 1: Atrial Fibrillation (ICD-427.31) Indication 2: Deep Vein Thrombosis - Leg (ICD-451.1) Lab Used: [facility] Site: [facility] INR POC 2.0 INR RANGE 2 - 3  Dietary changes: no    Health status changes: no    Bleeding/hemorrhagic complications: no    Recent/future hospitalizations: no    Any changes in medication regimen? no    Recent/future dental: no  Any missed doses?: yes     Details: missed Wednesdays dose   Is patient compliant with meds? yes       Allergies: 1)  ! Heparin  Anticoagulation Management History:      The patient is taking warfarin and comes in today for a routine follow up visit.  Negative risk factors for bleeding include an age less than 65 years old.  The bleeding index is 'low risk'.  Negative CHADS2 values include Age > 65 years old.  The start date was 09/05/2005.  Her last INR was 3.1.  Anticoagulation responsible provider: Juanda Chance MD, Smitty Cords.  INR POC: 2.0.  Cuvette Lot#: [id number].  Exp: 10/2010.    Anticoagulation Management Assessment/Plan:      The patient's current anticoagulation dose is Coumadin 6 mg tabs: Take as directed by coumadin clinic..  The target INR is 2 - 3.  The next INR is due 10/09/2009.  Anticoagulation instructions were given to patient.  Results were reviewed/authorized by Bethena Midget, RN, BSN.  She was notified by Bethena Midget, RN, BSN.         Prior Anticoagulation Instructions: INR 2.2 Continue 6mg s everyday except 3mg s on Wednesdays and Fridays.  Recheck in 4 weeks/   Current Anticoagulation Instructions: INR 2.0 Today take 6mg s then resume 6mg s everyday except 3mg s on Wednesdays and Fridays. Recheck in 4 weeks.

## 2010-04-27 NOTE — Medication Information (Signed)
Summary: rov./tm  Anticoagulant Therapy  Managed by: Cloyde Reams, RN, BSN Referring MD: Rollene Rotunda MD PCP: Kevan Ny, MD Supervising MD: Juanda Chance MD, Ilir Mahrt Indication 1: Atrial Fibrillation (ICD-427.31) Indication 2: Deep Vein Thrombosis - Leg (ICD-451.1) Lab Used: [facility] Site: [facility] INR POC 2.1 INR RANGE 2 - 3  Dietary changes: no    Health status changes: no    Bleeding/hemorrhagic complications: no    Recent/future hospitalizations: no    Any changes in medication regimen? no    Recent/future dental: no  Any missed doses?: no       Is patient compliant with meds? yes       Allergies: 1)  ! Heparin  Anticoagulation Management History:      The patient is taking warfarin and comes in today for a routine follow up visit.  Negative risk factors for bleeding include an age less than 59 years old.  The bleeding index is 'low risk'.  Negative CHADS2 values include Age > 52 years old.  The start date was 09/05/2005.  Her last INR was 3.1.  Anticoagulation responsible provider: Juanda Chance MD, Smitty Cords.  INR POC: 2.1.  Cuvette Lot#: [id number].  Exp: 07/2010.    Anticoagulation Management Assessment/Plan:      The patient's current anticoagulation dose is Coumadin 6 mg tabs: Take as directed by coumadin clinic..  The target INR is 2 - 3.  The next INR is due 06/26/2009.  Anticoagulation instructions were given to patient.  Results were reviewed/authorized by Cloyde Reams, RN, BSN.  She was notified by Cloyde Reams RN.         Prior Anticoagulation Instructions: INR 2.8 Continue 6mg s everyday except 3mg s on Wednesdays and Fridays. Recheck in 4 weeks  Current Anticoagulation Instructions: INR 2.1  Continue on same dosage 1 tablet daily except 1/2 tablet on Wednesdays and Fridays.  Recheck in 4 weeks.

## 2010-04-29 NOTE — Medication Information (Signed)
Summary: ROV  Anticoagulant Therapy  Managed by: Weston Brass, PharmD Referring MD: Rollene Rotunda MD PCP: Johnella Moloney, MD Supervising MD: Myrtis Ser MD, Tinnie Gens Indication 1: Atrial Fibrillation (ICD-427.31) Indication 2: Deep Vein Thrombosis - Leg (ICD-451.1) Lab Used: [facility] Site: [facility] INR POC 2.2 INR RANGE 2 - 3  Dietary changes: no    Health status changes: no    Bleeding/hemorrhagic complications: no    Recent/future hospitalizations: no    Any changes in medication regimen? no    Recent/future dental: no  Any missed doses?: no       Is patient compliant with meds? yes       Allergies: 1)  ! Heparin  Anticoagulation Management History:      The patient is taking warfarin and comes in today for a routine follow up visit.  Negative risk factors for bleeding include an age less than 38 years old.  The bleeding index is 'low risk'.  Negative CHADS2 values include Age > 69 years old.  The start date was 09/05/2005.  Her last INR was 3.1.  Anticoagulation responsible provider: Myrtis Ser MD, Tinnie Gens.  INR POC: 2.2.  Cuvette Lot#: [id number].  Exp: 04/2011.    Anticoagulation Management Assessment/Plan:      The patient's current anticoagulation dose is Coumadin 6 mg tabs: Take as directed by coumadin clinic..  The target INR is 2 - 3.  The next INR is due 04/09/2010.  Anticoagulation instructions were given to patient.  Results were reviewed/authorized by Weston Brass, PharmD.  She was notified by Weston Brass PharmD.         Prior Anticoagulation Instructions: INR:  1.5  Your INR is low today but you missed last night's dose.  Please take 6 mg (1 tablet) tonight and 9 mg (1.5 tablet) tomorrow night then return to your normal schedule.   Please return in 2 weeks for another INR check.    Current Anticoagulation Instructions: INR 2.2  Continue same dose of 1 tablet every day except 1/2 tablet on Wednesday and Friday.  Recheck INR in 3 weeks.

## 2010-04-29 NOTE — Medication Information (Signed)
Summary: rov/sp  Anticoagulant Therapy  Managed by: Weston Brass, PharmD Referring MD: Rollene Rotunda MD PCP: Johnella Moloney, MD Supervising MD: Patty Sermons, MD Indication 1: Atrial Fibrillation (ICD-427.31) Indication 2: Deep Vein Thrombosis - Leg (ICD-451.1) Lab Used: [facility] Site: [facility] INR POC 2.5 INR RANGE 2 - 3  Dietary changes: no    Health status changes: no    Bleeding/hemorrhagic complications: no    Recent/future hospitalizations: no    Any changes in medication regimen? no    Recent/future dental: no  Any missed doses?: no       Is patient compliant with meds? yes       Allergies: 1)  ! Heparin  Anticoagulation Management History:      The patient is taking warfarin and comes in today for a routine follow up visit.  Negative risk factors for bleeding include an age less than 53 years old.  The bleeding index is 'low risk'.  Negative CHADS2 values include Age > 42 years old.  The start date was 09/05/2005.  Her last INR was 3.1.  Anticoagulation responsible provider: Patty Sermons, MD.  INR POC: 2.5.  Cuvette Lot#: [id number].  Exp: 04/2011.    Anticoagulation Management Assessment/Plan:      The patient's current anticoagulation dose is Coumadin 6 mg tabs: Take as directed by coumadin clinic..  The target INR is 2 - 3.  The next INR is due 05/07/2010.  Anticoagulation instructions were given to patient.  Results were reviewed/authorized by Weston Brass, PharmD.         Prior Anticoagulation Instructions: INR 2.2  Continue same dose of 1 tablet every day except 1/2 tablet on Wednesday and Friday.  Recheck INR in 3 weeks.   Current Anticoagulation Instructions: INR 2.5  Coumadin 6 mg tablets - Take 1 tablet every day except 1/2 tablet on Wednesdays and Fridays

## 2010-05-07 ENCOUNTER — Encounter: Payer: Self-pay | Admitting: Cardiology

## 2010-05-07 ENCOUNTER — Encounter (INDEPENDENT_AMBULATORY_CARE_PROVIDER_SITE_OTHER): Payer: 59

## 2010-05-07 DIAGNOSIS — I4891 Unspecified atrial fibrillation: Secondary | ICD-10-CM

## 2010-05-07 DIAGNOSIS — Z7901 Long term (current) use of anticoagulants: Secondary | ICD-10-CM

## 2010-05-07 LAB — CONVERTED CEMR LAB: POC INR: 2.2

## 2010-05-13 NOTE — Medication Information (Signed)
Summary: [facility]in Clinic  Anticoagulant Therapy  Managed by: Weston Brass, PharmD Referring MD: Rollene Rotunda MD PCP: Johnella Moloney, MD Supervising MD: Daleen Squibb MD, Maisie Fus Indication 1: Atrial Fibrillation (ICD-427.31) Indication 2: Deep Vein Thrombosis - Leg (ICD-451.1) Lab Used: [facility] Site: [facility] INR POC 2.2 INR RANGE 2 - 3  Dietary changes: no    Health status changes: no    Bleeding/hemorrhagic complications: no    Recent/future hospitalizations: no    Any changes in medication regimen? no    Recent/future dental: no  Any missed doses?: yes     Details: Missed 1 dose on Wednesday (2/8)  Is patient compliant with meds? yes       Allergies: 1)  ! Heparin  Anticoagulation Management History:      The patient is taking warfarin and comes in today for a routine follow up visit.  Negative risk factors for bleeding include an age less than 62 years old.  The bleeding index is 'low risk'.  Negative CHADS2 values include Age > 11 years old.  The start date was 09/05/2005.  Her last INR was 3.1.  Anticoagulation responsible provider: Daleen Squibb MD, Maisie Fus.  INR POC: 2.2.  Cuvette Lot#: [id number].  Exp: 03/2011.    Anticoagulation Management Assessment/Plan:      The patient's current anticoagulation dose is Coumadin 6 mg tabs: Take as directed by coumadin clinic..  The target INR is 2 - 3.  The next INR is due 06/04/2010.  Anticoagulation instructions were given to patient.  Results were reviewed/authorized by Weston Brass, PharmD.  She was notified by Margot Chimes PharmD Candidate.         Prior Anticoagulation Instructions: INR 2.5  Coumadin 6 mg tablets - Take 1 tablet every day except 1/2 tablet on Wednesdays and Fridays   Current Anticoagulation Instructions: INR 2.2  Continue your current dose of 1 tablet everyday except 1/2 tablet on Wednesdays and Fridays.  Recheck INR in 4 weeks.

## 2010-06-03 ENCOUNTER — Emergency Department (HOSPITAL_COMMUNITY): Payer: 59

## 2010-06-03 ENCOUNTER — Inpatient Hospital Stay (HOSPITAL_COMMUNITY)
Admission: EM | Admit: 2010-06-03 | Discharge: 2010-06-09 | DRG: 023 | Disposition: A | Discharge status: Rehabilitation Facility | Payer: 59 | Attending: Neurology | Admitting: Neurology

## 2010-06-03 ENCOUNTER — Encounter: Payer: Self-pay | Admitting: Cardiology

## 2010-06-03 ENCOUNTER — Inpatient Hospital Stay (HOSPITAL_COMMUNITY): Payer: 59

## 2010-06-03 DIAGNOSIS — J9600 Acute respiratory failure, unspecified whether with hypoxia or hypercapnia: Secondary | ICD-10-CM | POA: Diagnosis not present

## 2010-06-03 DIAGNOSIS — R29810 Facial weakness: Secondary | ICD-10-CM | POA: Diagnosis present

## 2010-06-03 DIAGNOSIS — I6350 Cerebral infarction due to unspecified occlusion or stenosis of unspecified cerebral artery: Secondary | ICD-10-CM

## 2010-06-03 DIAGNOSIS — H53469 Homonymous bilateral field defects, unspecified side: Secondary | ICD-10-CM | POA: Diagnosis present

## 2010-06-03 DIAGNOSIS — R791 Abnormal coagulation profile: Secondary | ICD-10-CM | POA: Diagnosis present

## 2010-06-03 DIAGNOSIS — R894 Abnormal immunological findings in specimens from other organs, systems and tissues: Secondary | ICD-10-CM | POA: Diagnosis present

## 2010-06-03 DIAGNOSIS — R1310 Dysphagia, unspecified: Secondary | ICD-10-CM | POA: Diagnosis present

## 2010-06-03 DIAGNOSIS — Z86718 Personal history of other venous thrombosis and embolism: Secondary | ICD-10-CM

## 2010-06-03 DIAGNOSIS — Z79899 Other long term (current) drug therapy: Secondary | ICD-10-CM

## 2010-06-03 DIAGNOSIS — Z7901 Long term (current) use of anticoagulants: Secondary | ICD-10-CM

## 2010-06-03 DIAGNOSIS — I052 Rheumatic mitral stenosis with insufficiency: Secondary | ICD-10-CM | POA: Diagnosis present

## 2010-06-03 DIAGNOSIS — Z23 Encounter for immunization: Secondary | ICD-10-CM

## 2010-06-03 DIAGNOSIS — G8100 Flaccid hemiplegia affecting unspecified side: Secondary | ICD-10-CM | POA: Diagnosis present

## 2010-06-03 DIAGNOSIS — I2699 Other pulmonary embolism without acute cor pulmonale: Secondary | ICD-10-CM

## 2010-06-03 DIAGNOSIS — I4891 Unspecified atrial fibrillation: Secondary | ICD-10-CM

## 2010-06-03 DIAGNOSIS — I079 Rheumatic tricuspid valve disease, unspecified: Secondary | ICD-10-CM | POA: Diagnosis present

## 2010-06-03 DIAGNOSIS — I619 Nontraumatic intracerebral hemorrhage, unspecified: Secondary | ICD-10-CM | POA: Diagnosis not present

## 2010-06-03 DIAGNOSIS — Z7982 Long term (current) use of aspirin: Secondary | ICD-10-CM

## 2010-06-03 DIAGNOSIS — A498 Other bacterial infections of unspecified site: Secondary | ICD-10-CM | POA: Diagnosis present

## 2010-06-03 DIAGNOSIS — I10 Essential (primary) hypertension: Secondary | ICD-10-CM | POA: Diagnosis present

## 2010-06-03 DIAGNOSIS — Z86711 Personal history of pulmonary embolism: Secondary | ICD-10-CM

## 2010-06-03 DIAGNOSIS — N390 Urinary tract infection, site not specified: Secondary | ICD-10-CM | POA: Diagnosis present

## 2010-06-03 LAB — DIFFERENTIAL
Basophils Absolute: 0 10*3/uL (ref 0.0–0.1)
Basophils Relative: 0 % (ref 0–1)
Basophils Relative: 0 % (ref 0–1)
Eosinophils Absolute: 0 10*3/uL (ref 0.0–0.7)
Eosinophils Relative: 1 % (ref 0–5)
Eosinophils Relative: 0 % (ref 0–5)
Lymphocytes Relative: 22 % (ref 12–46)
Lymphs Abs: 0.6 10*3/uL — ABNORMAL LOW (ref 0.7–4.0)
Monocytes Absolute: 1.1 10*3/uL — ABNORMAL HIGH (ref 0.1–1.0)
Monocytes Relative: 11 % (ref 3–12)
Neutro Abs: 6.4 10*3/uL (ref 1.7–7.7)

## 2010-06-03 LAB — BASIC METABOLIC PANEL
CO2: 20 mEq/L (ref 19–32)
Chloride: 109 mEq/L (ref 96–112)
GFR calc non Af Amer: >60 mL/min (ref >60)
Glucose, Bld: 212 mg/dL — ABNORMAL HIGH (ref 70–99)
Potassium: 3.5 mEq/L (ref 3.5–5.1)
Sodium: 137 mEq/L (ref 135–145)

## 2010-06-03 LAB — CK TOTAL AND CKMB (NOT AT ARMC)
CK, MB: 6.9 ng/mL (ref 0.3–4.0)
Total CK: 426 U/L — ABNORMAL HIGH (ref 7–177)

## 2010-06-03 LAB — CBC
HCT: 42.7 % (ref 36.0–46.0)
Hemoglobin: 14.5 g/dL (ref 12.0–15.0)
MCH: 32.4 pg (ref 26.0–34.0)
MCH: 32.2 pg (ref 26.0–34.0)
MCHC: 34 g/dL (ref 30.0–36.0)
MCV: 95 fL (ref 78.0–100.0)
Platelets: 180 10*3/uL (ref 150–400)
RDW: 13 % (ref 11.5–15.5)
RDW: 12.8 % (ref 11.5–15.5)
WBC: 8.9 10*3/uL (ref 4.0–10.5)

## 2010-06-03 LAB — URINALYSIS, ROUTINE W REFLEX MICROSCOPIC
Glucose, UA: NEGATIVE mg/dL
Ketones, ur: NEGATIVE mg/dL
Leukocytes, UA: NEGATIVE
Protein, ur: 30 mg/dL — AB
pH: 5.5 (ref 5.0–8.0)

## 2010-06-03 LAB — COMPREHENSIVE METABOLIC PANEL
ALT: 15 U/L (ref 0–35)
Alkaline Phosphatase: 95 U/L (ref 39–117)
BUN: 12 mg/dL (ref 6–23)
CO2: 23 mEq/L (ref 19–32)
GFR calc non Af Amer: 55 mL/min — ABNORMAL LOW (ref >60)
Glucose, Bld: 109 mg/dL — ABNORMAL HIGH (ref 70–99)
Potassium: 3.5 mEq/L (ref 3.5–5.1)
Sodium: 136 mEq/L (ref 135–145)
Total Bilirubin: 0.7 mg/dL (ref 0.3–1.2)

## 2010-06-03 LAB — POCT I-STAT, CHEM 8
Creatinine, Ser: 1.1 mg/dL (ref 0.4–1.2)
HCT: 43 % (ref 36.0–46.0)
Hemoglobin: 14.6 g/dL (ref 12.0–15.0)
Potassium: 3.6 mEq/L (ref 3.5–5.1)
Sodium: 140 mEq/L (ref 135–145)
TCO2: 24 mmol/L (ref 0–100)

## 2010-06-03 LAB — URINE MICROSCOPIC-ADD ON

## 2010-06-03 LAB — BLOOD GAS, ARTERIAL
Acid-base deficit: 4.9 mmol/L — ABNORMAL HIGH (ref 0.0–2.0)
Bicarbonate: 19.8 mEq/L — ABNORMAL LOW (ref 20.0–24.0)
FIO2: 0.4 %
TCO2: 20.9 mmol/L (ref 0–100)
pCO2 arterial: 37.4 mmHg (ref 35.0–45.0)
pH, Arterial: 7.343 — ABNORMAL LOW (ref 7.350–7.400)

## 2010-06-03 LAB — HEPATIC FUNCTION PANEL
ALT: 19 U/L (ref 0–35)
AST: 26 U/L (ref 0–37)
Albumin: 2.6 g/dL — ABNORMAL LOW (ref 3.5–5.2)
Bilirubin, Direct: 0.1 mg/dL (ref 0.0–0.3)
Total Protein: 5.7 g/dL — ABNORMAL LOW (ref 6.0–8.3)

## 2010-06-03 LAB — POCT CARDIAC MARKERS
CKMB, poc: 5.4 ng/mL (ref 1.0–8.0)
Myoglobin, poc: 280 ng/mL (ref 12–200)

## 2010-06-03 LAB — TROPONIN I: Troponin I: <0.01 ng/mL (ref 0.00–0.06)

## 2010-06-03 LAB — MRSA PCR SCREENING: MRSA by PCR: NEGATIVE

## 2010-06-03 LAB — LACTIC ACID, PLASMA: Lactic Acid, Venous: 2.7 mmol/L — ABNORMAL HIGH (ref 0.5–2.2)

## 2010-06-03 LAB — BRAIN NATRIURETIC PEPTIDE: Pro B Natriuretic peptide (BNP): 180 pg/mL — ABNORMAL HIGH (ref 0.0–100.0)

## 2010-06-03 MED ORDER — IOHEXOL 300 MG/ML  SOLN
300.0000 mL | Freq: Once | INTRAMUSCULAR | Status: AC | PRN
  Administered 2010-06-03: 170 mL via INTRA_ARTERIAL

## 2010-06-04 ENCOUNTER — Other Ambulatory Visit (HOSPITAL_COMMUNITY): Payer: 59

## 2010-06-04 ENCOUNTER — Inpatient Hospital Stay (HOSPITAL_COMMUNITY): Payer: 59

## 2010-06-04 DIAGNOSIS — I059 Rheumatic mitral valve disease, unspecified: Secondary | ICD-10-CM

## 2010-06-04 DIAGNOSIS — I4891 Unspecified atrial fibrillation: Secondary | ICD-10-CM

## 2010-06-04 DIAGNOSIS — I63511 Cerebral infarction due to unspecified occlusion or stenosis of right middle cerebral artery: Secondary | ICD-10-CM

## 2010-06-04 HISTORY — DX: Cerebral infarction due to unspecified occlusion or stenosis of right middle cerebral artery: I63.511

## 2010-06-04 LAB — CBC
HCT: 36.8 % (ref 36.0–46.0)
RBC: 3.88 MIL/uL (ref 3.87–5.11)
RDW: 13 % (ref 11.5–15.5)
WBC: 10.7 10*3/uL — ABNORMAL HIGH (ref 4.0–10.5)

## 2010-06-04 LAB — GLUCOSE, CAPILLARY
Glucose-Capillary: 166 mg/dL — ABNORMAL HIGH (ref 70–99)
Glucose-Capillary: 166 mg/dL — ABNORMAL HIGH (ref 70–99)

## 2010-06-04 LAB — LIPID PANEL
Cholesterol: 139 mg/dL (ref 0–200)
HDL: 33 mg/dL — ABNORMAL LOW (ref >39)
Triglycerides: 71 mg/dL (ref <150)

## 2010-06-04 LAB — BASIC METABOLIC PANEL
Chloride: 110 mEq/L (ref 96–112)
GFR calc non Af Amer: >60 mL/min (ref >60)
Glucose, Bld: 193 mg/dL — ABNORMAL HIGH (ref 70–99)
Potassium: 4.1 mEq/L (ref 3.5–5.1)
Sodium: 136 mEq/L (ref 135–145)

## 2010-06-04 LAB — CALCIUM, IONIZED: Calcium, Ion: 1.1 mmol/L — ABNORMAL LOW (ref 1.12–1.32)

## 2010-06-04 NOTE — H&P (Addendum)
[name], [name]               ACCOUNT NO.:  [id number]  MEDICAL RECORD NO.:  [id number]           PATIENT TYPE:  I  LOCATION:  3106                         FACILITY:  [facility]  PHYSICIAN:  Levie Heritage, MD       DATE OF BIRTH:  May 03, 1945  DATE OF ADMISSION:  06/03/2010 DATE OF DISCHARGE:                             HISTORY & PHYSICAL   CHIEF COMPLAINT:  Acute stroke.  HISTORY OF PRESENT ILLNESS:  This patient is a 65 year old woman with multiple comorbidities who had sudden onset of left face, arm, and leg weakness started on 2:15 p.m. today.  She was seen by me for the first time at 3:15 and at that time, her weakness has been given below.  The patient herself gave her history stating that she took her grandchildren from school to home and was sitting back home on the chair when she suddenly stood up and had a fall on the left side, not knowing that she had gone weak on the left side.  Where she could still throughout talk, although does claim to have sensory abnormalities on the left as well.  A CAT scan of the head was performed in the emergency room and did not show any acute abnormality.  Based on her symptom, she was given t-PA and I have discussed the details with her brother and sister at the bedside.  The t-PA was started at 4 p.m.  PAST MEDICAL HISTORY: 1. Premature atrial fibrillation and she has been on Coumadin 6 mg     daily basis.  Her current INR today is 1.49. 2. Remote history of pulmonary embolism. 3. Mitral regurgitation with stenosis. 4. Mild-to-moderate tricuspid regurgitation. 5. Morbid obesity. 6. Hypertension. 7. DVT. 8. She had lower extremity arterial thrombosis as well in the past.  ALLERGIES:  She is allergic to HEPARIN.  MEDICATIONS ON ADMISSION: 1. She takes furosemide 20 mg twice a day. 2. Metoprolol 20 mg half tablet by mouth 2 times daily. 3. Aspirin 81 mg daily. 4. She takes Cartia 300 mg extended release 24-hour capsule  daily. 5. She takes Coumadin 6 mg daily. 6. Warfarin.  SOCIAL HISTORY:  She is used to work in [employer].  Does not smoke or drink alcohol.  FAMILY HISTORY:  Noncontributory for early coronary artery disease in other family members or any early vascular abnormalities otherwise.  PHYSICAL EXAMINATION:  GENERAL:  The patient is lying on the ER stretcher with having some gaze preference to the side.  She does not have any forced deviation.  She has no aphasia and I do not think even she has dysarthria, although the lack of her denture make her feel like having dysarthria, but I do not think she has dysarthria otherwise. NEUROLOGIC:  She has left face, arm, and leg weakness, but is limited only 1/5 strength for the left arm and leg at the most and then she also does not have sensation on the same side as well.  She has intact sense on the right face and arm.  She has left hemianopsia at the bedside exam.  Her NIHSS score scale is scored as  1 for level of consciousness whereas she just missed the age, 1 for the gaze preference, 2 hemianopsia, 1 for the facial palsy, 3 for the left arm weakness, 3 for the left leg weakness, 1 for the leg ataxia, and 1 for partial neglect.  Her total score is 14 performed at 1545 p.m.  LABORATORY DATA:  Her INR was 1.49 today.  Hemoglobin 14.6, hematocrit 43, her BUN 14, creatinine 1.1, platelets are 198,000, sodium 140, potassium 3.6, and glucose is 109.  I have reviewed her CAT scan of the head in the emergency and I have not seen any acute hypodensity or hyperdensity.  IMPRESSION:  A 65 year old woman with sudden onset of left face and arm weakness in the setting of atrial fibrillation, but has an INR that is subtherapeutic, is having ischemic event in the right middle cerebral artery distribution.  PLAN:  The t-PA was started after discussing with the family in the ER. Two-third of the dose was given and given the large vessel symptoms,  she was taken to the neuro intervention suite for the possible intervention as well.  Further intervention information will be based on the findings.  We will hold off her blood pressure medications for now.  We will repeat the CAT scan of the head after 24 hours tomorrow.  As she will get the fasting lipid panel, I will proceed accordingly with the management.  Family was updated by myself as well as by the intervention neurology person, Dr. Corliss Skains in detail.  Critical Care Team was notified for the management of the mechanical ventilation once she is admitted in the ICU.          ______________________________ Levie Heritage, MD     WS/MEDQ  D:  06/03/2010  T:  06/04/2010  Job:  [id number]  Electronically Signed by Levie Heritage MD on 06/04/2010 12:13:22 PM

## 2010-06-05 ENCOUNTER — Inpatient Hospital Stay (HOSPITAL_COMMUNITY): Payer: 59

## 2010-06-05 DIAGNOSIS — Z9911 Dependence on respirator [ventilator] status: Secondary | ICD-10-CM

## 2010-06-05 LAB — URINE CULTURE: Colony Count: >=100000

## 2010-06-05 LAB — GLUCOSE, CAPILLARY
Glucose-Capillary: 124 mg/dL — ABNORMAL HIGH (ref 70–99)
Glucose-Capillary: 156 mg/dL — ABNORMAL HIGH (ref 70–99)

## 2010-06-06 ENCOUNTER — Inpatient Hospital Stay (HOSPITAL_COMMUNITY): Payer: 59

## 2010-06-06 LAB — CBC
HCT: 31.8 % — ABNORMAL LOW (ref 36.0–46.0)
Hemoglobin: 10.6 g/dL — ABNORMAL LOW (ref 12.0–15.0)
MCH: 32.4 pg (ref 26.0–34.0)
MCHC: 33.3 g/dL (ref 30.0–36.0)
RBC: 3.27 MIL/uL — ABNORMAL LOW (ref 3.87–5.11)

## 2010-06-06 LAB — BASIC METABOLIC PANEL
CO2: 27 mEq/L (ref 19–32)
Calcium: 7.9 mg/dL — ABNORMAL LOW (ref 8.4–10.5)
Chloride: 106 mEq/L (ref 96–112)
GFR calc Af Amer: >60 mL/min (ref >60)
Glucose, Bld: 168 mg/dL — ABNORMAL HIGH (ref 70–99)
Sodium: 140 mEq/L (ref 135–145)

## 2010-06-06 LAB — GLUCOSE, CAPILLARY
Glucose-Capillary: 107 mg/dL — ABNORMAL HIGH (ref 70–99)
Glucose-Capillary: 108 mg/dL — ABNORMAL HIGH (ref 70–99)
Glucose-Capillary: 148 mg/dL — ABNORMAL HIGH (ref 70–99)
Glucose-Capillary: 154 mg/dL — ABNORMAL HIGH (ref 70–99)

## 2010-06-07 ENCOUNTER — Inpatient Hospital Stay (HOSPITAL_COMMUNITY): Payer: 59

## 2010-06-07 DIAGNOSIS — I6330 Cerebral infarction due to thrombosis of unspecified cerebral artery: Secondary | ICD-10-CM

## 2010-06-07 LAB — GLUCOSE, CAPILLARY
Glucose-Capillary: 102 mg/dL — ABNORMAL HIGH (ref 70–99)
Glucose-Capillary: 92 mg/dL (ref 70–99)
Glucose-Capillary: 97 mg/dL (ref 70–99)
Glucose-Capillary: 130 mg/dL — ABNORMAL HIGH (ref 70–99)

## 2010-06-08 DIAGNOSIS — I2699 Other pulmonary embolism without acute cor pulmonale: Secondary | ICD-10-CM

## 2010-06-08 LAB — GLUCOSE, CAPILLARY: Glucose-Capillary: 102 mg/dL — ABNORMAL HIGH (ref 70–99)

## 2010-06-09 ENCOUNTER — Inpatient Hospital Stay (HOSPITAL_COMMUNITY)
Admission: RE | Admit: 2010-06-09 | Discharge: 2010-07-09 | DRG: 945 | Disposition: A | Discharge status: Skilled Nursing Facility | Payer: 59 | Source: Other Acute Inpatient Hospital | Attending: Physical Medicine & Rehabilitation | Admitting: Physical Medicine & Rehabilitation

## 2010-06-09 DIAGNOSIS — B952 Enterococcus as the cause of diseases classified elsewhere: Secondary | ICD-10-CM | POA: Diagnosis present

## 2010-06-09 DIAGNOSIS — F329 Major depressive disorder, single episode, unspecified: Secondary | ICD-10-CM | POA: Diagnosis present

## 2010-06-09 DIAGNOSIS — Z5189 Encounter for other specified aftercare: Principal | ICD-10-CM

## 2010-06-09 DIAGNOSIS — I4891 Unspecified atrial fibrillation: Secondary | ICD-10-CM | POA: Diagnosis present

## 2010-06-09 DIAGNOSIS — R21 Rash and other nonspecific skin eruption: Secondary | ICD-10-CM | POA: Diagnosis present

## 2010-06-09 DIAGNOSIS — I6350 Cerebral infarction due to unspecified occlusion or stenosis of unspecified cerebral artery: Secondary | ICD-10-CM | POA: Diagnosis present

## 2010-06-09 DIAGNOSIS — I10 Essential (primary) hypertension: Secondary | ICD-10-CM | POA: Diagnosis present

## 2010-06-09 DIAGNOSIS — Z86718 Personal history of other venous thrombosis and embolism: Secondary | ICD-10-CM

## 2010-06-09 DIAGNOSIS — N390 Urinary tract infection, site not specified: Secondary | ICD-10-CM

## 2010-06-09 DIAGNOSIS — I509 Heart failure, unspecified: Secondary | ICD-10-CM | POA: Diagnosis present

## 2010-06-09 DIAGNOSIS — I6330 Cerebral infarction due to thrombosis of unspecified cerebral artery: Secondary | ICD-10-CM

## 2010-06-09 DIAGNOSIS — G8190 Hemiplegia, unspecified affecting unspecified side: Secondary | ICD-10-CM | POA: Diagnosis present

## 2010-06-09 DIAGNOSIS — F3289 Other specified depressive episodes: Secondary | ICD-10-CM | POA: Diagnosis present

## 2010-06-09 DIAGNOSIS — Z7901 Long term (current) use of anticoagulants: Secondary | ICD-10-CM

## 2010-06-09 DIAGNOSIS — Z7982 Long term (current) use of aspirin: Secondary | ICD-10-CM

## 2010-06-09 DIAGNOSIS — R1312 Dysphagia, oropharyngeal phase: Secondary | ICD-10-CM | POA: Diagnosis present

## 2010-06-09 LAB — BASIC METABOLIC PANEL
CO2: 30 mEq/L (ref 19–32)
Calcium: 8.5 mg/dL (ref 8.4–10.5)
Creatinine, Ser: 0.75 mg/dL (ref 0.4–1.2)
Glucose, Bld: 97 mg/dL (ref 70–99)

## 2010-06-09 LAB — CBC
MCH: 31.8 pg (ref 26.0–34.0)
MCV: 97.9 fL (ref 78.0–100.0)
Platelets: 217 10*3/uL (ref 150–400)
RDW: 13.5 % (ref 11.5–15.5)

## 2010-06-10 LAB — COMPREHENSIVE METABOLIC PANEL
AST: 22 U/L (ref 0–37)
BUN: 11 mg/dL (ref 6–23)
CO2: 30 mEq/L (ref 19–32)
Calcium: 8.4 mg/dL (ref 8.4–10.5)
Creatinine, Ser: 0.81 mg/dL (ref 0.4–1.2)
GFR calc Af Amer: >60 mL/min (ref >60)
GFR calc non Af Amer: >60 mL/min (ref >60)

## 2010-06-10 LAB — URINALYSIS, MICROSCOPIC ONLY
Bilirubin Urine: NEGATIVE
Nitrite: NEGATIVE
Specific Gravity, Urine: 1.022 (ref 1.005–1.030)
Urobilinogen, UA: 0.2 mg/dL (ref 0.0–1.0)
pH: 5.5 (ref 5.0–8.0)

## 2010-06-10 LAB — CBC
Hemoglobin: 12.7 g/dL (ref 12.0–15.0)
MCH: 32.1 pg (ref 26.0–34.0)
MCHC: 33.1 g/dL (ref 30.0–36.0)
MCV: 97 fL (ref 78.0–100.0)

## 2010-06-10 LAB — DIFFERENTIAL
Basophils Relative: 1 % (ref 0–1)
Eosinophils Absolute: 0.3 10*3/uL (ref 0.0–0.7)
Lymphocytes Relative: 10 % — ABNORMAL LOW (ref 12–46)
Monocytes Absolute: 1.1 10*3/uL — ABNORMAL HIGH (ref 0.1–1.0)
Neutro Abs: 8.8 10*3/uL — ABNORMAL HIGH (ref 1.7–7.7)

## 2010-06-10 NOTE — Consult Note (Signed)
NAME:  [name], [name] NO.:  [id number]  MEDICAL RECORD NO.:  [id number]           PATIENT TYPE:  I  LOCATION:  3106                         FACILITY:  [facility]  PHYSICIAN:  Rollene Rotunda, MD, FACCDATE OF BIRTH:  [birth year]  DATE OF CONSULTATION:  06/04/2010 DATE OF DISCHARGE:                                CONSULTATION   PRIMARY CARE PROVIDER:  Lovenia Kim, DO  CARDIOLOGIST:  Rollene Rotunda, MD, [facility]  REASON FOR CONSULTATION:  Atrial fibrillation.  HISTORY OF PRESENT ILLNESS:  The patient is a pleasant 65 year old with a history of permanent atrial fibrillation.  She has had DVTs and arterial emboli in the past as well.  She had been on chronic anticoagulation and was therapeutic in our office when checked on May 07, 2010.  Unfortunately, she came in with a CVA in the right MCA with thrombus.  She was treated with intra-arterial tPA and thrombectomy.  She is currently intubated and sedated.  Of note, her INR was subtherapeutic on admission of 1.49.  History from the patient was unobtainable.  I did review my previous office record from March of last year and she was not having any symptoms.  We are asked to consult for management of atrial fibrillation.  PAST MEDICAL HISTORY: 1. Permanent atrial fibrillation. 2. Pulmonary emboli/DVT with IVC filter. 3. Lower extremity arterial thrombi. 4. Hypertension. 5. HIT. 6. Lupus anticoagulant detected in the past.  PAST SURGICAL HISTORY: 1. Open fixation of left radial fracture. 2. Bilateral femoral embolectomies.  ALLERGIES:  Intolerance is HEPARIN.  MEDICATIONS:  (Current) cefazolin, diltiazem 60 mg q.6 h., Lasix 20 mg b.i.d., metoprolol 12.5 mg b.i.d., and labetalol p.r.n.  SOCIAL HISTORY:  The patient does not smoke cigarettes.  She is not drinking alcohol.  FAMILY HISTORY:  Contributory for father dying of myocardial infarction at age 70.  There is no history of clotting or bleeding  disorders.  REVIEW OF SYSTEMS:  Unobtainable from the patient.  PHYSICAL EXAMINATION:  GENERAL:  The patient is intubated and sedated. VITAL SIGNS:  Blood pressure 147/80, heart rate 110 and irregular, afebrile. LUNGS:  Decreased breath sounds.  No wheezing, no crackles. CHEST:  Unremarkable. HEART:  PMI not displaced or sustained, distant heart sounds, irregular, S1, S2 within normal limits, no S3, no clicks, no rubs, no murmurs. ABDOMEN:  Morbidly obese, positive bowel sounds.  Decreased in frequency, but normal in pitch, no obvious hepatomegaly, no splenomegaly. SKIN:  No rashes, no nodules. EXTREMITIES:  2+ upper pulses, 2+ right dorsalis pedis and posterior tibialis, 1+ left dorsalis pedis and posterior tibialis. NEUROLOGIC:  She is intubated and sedated.  She is intermittently moving her right arm and right leg though.  IMAGING DATA:  EKG, atrial fibrillation with axis in normal limits, intervals within normal limits.  CT of the head MCA and CVA with some hemorrhagic conversion.  LABORATORY DATA: 1. Hemoglobin A1c 6.3, LDL 192, triglycerides 71, and LDL 33. 2. Sodium 136, potassium 4.1, BUN 9, creatinine 0.79, hemoglobin 12.3,     WBC 10.7, and platelets 177.  ASSESSMENT/PLAN: 1. Atrial fibrillation.  The patient unfortunately has had a  cerebrovascular accident despite Coumadin.  She was subtherapeutic     on presentation.  For now, her rate is reasonably controlled on     beta-blocker and calcium channel blocker as listed.  We could     certainly increase the calcium channel blocker or beta-blocker     interval if her rate sustains at a more elevated rate.  It does go     up transiently when she wakes up and we may need this as we     increase her level of consciousness.  For now, we are trying to     maintain blood pressure without dropping too far.  She may need IV     therapy at some point, but we will follow to assist with this     management. 2.  Cerebrovascular accident unfortunate.  The patient has not had a     stroke to go along with other multiple arterial and venous emboli.     There is a question of lupus anticoagulant.  She certainly will     need to have Coumadin back when it is felt to be safe from a     neurologic standpoint. 3. Hypertension.  Blood pressure is elevated and this will be managed     in the context of treating her atrial fibrillation as well.     Rollene Rotunda, MD, [facility]     JH/MEDQ  D:  06/04/2010  T:  06/04/2010  Job:  [id number]  Electronically Signed by Rollene Rotunda MD [facility] on 06/10/2010 08:01:39 PM

## 2010-06-11 ENCOUNTER — Encounter (HOSPITAL_COMMUNITY): Payer: Self-pay | Admitting: Radiology

## 2010-06-11 ENCOUNTER — Inpatient Hospital Stay (HOSPITAL_COMMUNITY): Payer: 59

## 2010-06-11 DIAGNOSIS — I10 Essential (primary) hypertension: Secondary | ICD-10-CM

## 2010-06-11 DIAGNOSIS — I4891 Unspecified atrial fibrillation: Secondary | ICD-10-CM

## 2010-06-11 DIAGNOSIS — N390 Urinary tract infection, site not specified: Secondary | ICD-10-CM

## 2010-06-11 DIAGNOSIS — I6330 Cerebral infarction due to thrombosis of unspecified cerebral artery: Secondary | ICD-10-CM

## 2010-06-11 NOTE — H&P (Signed)
[name], [name] NO.:  [id number]  MEDICAL RECORD NO.:  [id number]           PATIENT TYPE:  LOCATION:                                 FACILITY:  PHYSICIAN:  Ranelle Oyster, M.D.DATE OF BIRTH:  Dec 24, 1945  DATE OF ADMISSION: DATE OF DISCHARGE:                             HISTORY & PHYSICAL   CHIEF COMPLAINT:  Left-sided weakness.  PRIMARY CARE PHYSICIAN:  Candyce Churn, MD  HISTORY OF PRESENT ILLNESS:  A 65 year old white female with chronic Afib, on Coumadin, admitted on June 03, 2010, with left-sided weakness. Head CT showed possible dense right MCA infarct.  Cerebral angio showed M1 occlusion.  INR on admission was 1.5.  She did receive tPA and underwent mechanical embolectomy with Merci device on June 03, 2010, by Dr. Corliss Skains.  MRI of the brain on June 05, 2010, was notable for right basal ganglia hemorrhage with posterior limb of the right internal capsule stroke within the mid to posterior right corona radiata infarct. Carotid Dopplers were negative.  Echocardiogram was unremarkable. Neurology recommend aspirin.  Followup CT showed no increased hemorrhage or extension.  She was extubated on June 06, 2010.  Speech Language has been following her and she is on D2 honey liquid diet.  She was started on IV Rocephin for E-coli in her urine and completed this on June 09, 2010.  Plan is to resume Coumadin about a week as of June 07, 2010, due to her history of AFib.  I was asked to evaluate the patient.  I felt that she could benefit from an inpatient rehab stay and the patient ultimately was brought today.  REVIEW OF SYSTEMS:  Notable for left hemiparesis, borderline appetite. Denies pain.  She is sleeping well.  She has had some fatigue with therapies.  Otherwise, full review is in the written H and P.  PAST MEDICAL HISTORY:  Positive for CAF, remote pulmonary embolus, IVC filter placement, mitral regurgitation and arterial thrombus  history, hypertension, morbid obesity, CHF, history of HIT, and positive lupus anticoagulant.  She denies alcohol or tobacco.  FAMILY HISTORY:  Positive for CAD and MI.  SOCIAL HISTORY:  The patient lives alone.  Works at a [employer].  Daughters near who can assist at home.  She is in one-level house, 5-6 steps to enter.  ALLERGIES:  SUBCU HEPARIN.  MEDICATIONS AT HOME: 1. Lasix. 2. Metoprolol. 3. Aspirin. 4. Cartia. 5. Coumadin.  LABORATORY DATA:  Please see written H and P.  PHYSICAL EXAMINATION:  VITAL SIGNS:  Blood pressure is 126/87, pulse is 117, respiratory rate 20, temperature 98.3. GENERAL:  The patient is pleasant, alert, oriented x3.  Affect is generally bright appropriate. HEENT:  Pupils are equally round and reactive to light.  Nose and throat exam notable for borderline dentition.  Mucosa is generally pink and moist. NECK:  Supple without JVD or lymphadenopathy. CHEST:  Clear to auscultation bilaterally without wheezes, rales or rhonchi. HEART:  Regular rate and rhythm without murmur, rubs, or gallops. ABDOMEN:  Soft, nontender. SKIN:  Generally, intact throughout. CRANIAL NERVE EXAM:  Notable left central VII and slight tongue deviation.  She had intact visual field, facial sensation, hearing, etc..  Sensation is grossly 1/2 throughout the left arm and leg.  She had some proprioception.  Strength is 1-1+ in the left upper extremity and zero to trace left lower extremity.  Right upper and lower extremity are grossly 4/5.  Judgment, orientation, and memory were all appropriate.  Mood is a bit flat.  POSTADMISSION PHYSICIAN EVALUATION: 1. Functional deficit secondary to right basal ganglia hemorrhagic     infarct with additional involvement of the posterior limb of the     right internal capsule and into the right corona radiata.  The     patient with left hemiparesis, left hemisensory loss and dysphagia. 2. The patient is admitted to receive  collaborative interdisciplinary     care between the physiatrist, rehab nursing staff and Therapy Team. 3. The patient's level of medical complexity and substantial therapy     needs in context of that medical necessity cannot be provided at a     lesser intensity of care. 4. The patient has experienced substantial functional loss from her     baseline.  Upon functional assessment time of my rehab consult the     patient was total assist bed mobility, 50% total assist, transfers     30%, gait not been tested.  She is mod assist upper body and total     assist lower body ADLs.  Judging by the patient's diagnosis,     physical exam and functional history, she has a potential     functional progress which will result in measurable gains while     inpatient rehab.  Gains will be of substantial practical use upon     discharge to home facilitating mobility and self-care. 5. Physiatrist will provide 24-hour management of medical needs as     well as oversight of the therapy plan/treatment and provide     guidance as appropriate regarding interaction of the two.  Medical     problem list and plan are below. 6. A 24-hour rehab nursing team will assist in the management of the     patient's skin care needs as well as bowel and bladder function,     nutrition, integration of therapy concept techniques. 7. PT will assess and treat for lower extremity strength, range of     motion, functional mobility, neuromuscular education, safety, the     patient family education with goals overall at a min assist level     perhaps supervision. 8. OT will assess and treat for upper extremity use ADLs, adaptive     techniques, equipment, neuromuscular education, functional     mobility.  Goals overall supervision to min plus assist. 9. Speech language pathologist will assess and treat for swallowing     and speech/communication.  Screen for cognition as well with goals     modified independent. 10.Case  Management and Social Worker will assess and treat for     psychosocial issues.  DISCHARGE PLAN: 1. Team conference will be held weekly to assess progress towards     goals and determine barriers at discharge. 2. The patient has demonstrated sufficient medical stability and     exercise capacity to tolerate at least 3 hours of therapy per day     at least 5 days week. 3. Estimated length of stay is 3-4 weeks.  Prognosis is good.  MEDICAL PROBLEMS AND PLAN: 1. Stroke, anticoagulation with aspirin.  The patient will not be  placed on Lovenox due to hemorrhagic stroke.  Check lower extremity     venous Dopplers to screen out DVT. 2. Dysphagia:  D2 honey-thick liquid diet.  IV fluids at bedtime.  We     will advance diet as speech returns appropriate. 3. Cardiovascular:  Continue Cardizem, Lopressor for rate control and     the CV risk.  We will resume Coumadin about a week per recs of     Neurology.  No heparin due to history of heparin-induced     thrombocytopenia. 4. E. coli UTI:  Rocephin, completes today.     Ranelle Oyster, M.D.     ZTS/MEDQ  D:  06/09/2010  T:  06/10/2010  Job:  [id number]  cc:   Candyce Churn, M.D.  Electronically Signed by Faith Rogue M.D. on 06/11/2010 04:16:03 PM

## 2010-06-11 NOTE — Consult Note (Signed)
[name], PRIEN               ACCOUNT NO.:  [id number]  MEDICAL RECORD NO.:  [id number]           PATIENT TYPE:  LOCATION:                                 FACILITY:  PHYSICIAN:  Pramod P. Pearlean Brownie, MD    DATE OF BIRTH:  Nov 23, 1945  DATE OF CONSULTATION: DATE OF DISCHARGE:                                CONSULTATION   REFERRING PHYSICIAN:  Levie Heritage, MD  REASON FOR REFERRAL:  Code stroke.  HISTORY OF PRESENT ILLNESS:  Ms. Bensen is a 65 year old Caucasian lady who presented with sudden onset of left face, arm, and leg weakness at 2:15 p.m. yesterday.  She presented to [facility] as a code stroke and was seen within an hour of arrival.  She was found to have NIH stroke scale of 14.  She had a history of atrial fibrillation and was on Coumadin, but her INR was suboptimal at 1.49.  CT scan of the head showed no acute abnormality, but raised question of dense right middle cerebral artery sign.  IV t-PA was initiated two-thirds dose and she was taken for emergent catheter angiogram by Dr. Corliss Skains which showed complete occlusion of the right middle cerebral artery at the M1 portion extending into the proximal M2 branches.  After taking written informed consent from the patient's daughter, Dr. Corliss Skains gave additional 19 mg of intra-arterial t-PA followed by mechanical embolectomy with the Merci device as well as Penumbra device with eventual full recanalization achieved.  Postprocedure CT scan done at 8 p.m. last night shows hyperdensity in the right basal ganglia which may be dye versus hemorrhage.  The patient has been kept sedated, intubated overnight, and blood pressure has been tightly controlled.  She has no prior history of stroke, TIA, or seizures.  Past medical history is significant for atrial fibrillation, remote history pulmonary embolism, mitral regurgitation and stenosis, mild trivial regurg, morbid obesity, hypertension, deep vein thrombosis, lower  extremity arterial thrombosis.  ALLERGIES TO MEDICATIONS:  HEPARIN.  HOME MEDICATIONS: 1. Lasix 20 mg twice a day. 2. Metoprolol 10 mg twice a day. 3. Aspirin 81 mg daily. 4. Cartia 300 mg daily. 5. Coumadin 6 mg daily.  SOCIAL HISTORY:  The patient is retired.  She used to work in [employer].  Does not smoke or drink.  Lives at home.  She is independent in activities of daily living.  FAMILY HISTORY:  Noncontributory.  PHYSICAL EXAMINATION:  GENERAL:  Obese 65 year old Caucasian lady Caucasian lady who is intubated.  Pulse rate is 116 per minute, irregularly irregular, atrial fibrillation, blood pressure is 127/60.  She is on Cardene drip.  Distal pulses well felt. HEAD:  Nontraumatic. NECK:  Supple. EARS:  Hearing cannot be tested. CARDIAC:  Regular heart sounds, atrial fibrillation. LUNGS:  Clear to auscultation. NEUROLOGIC:  The patient is sedated.  I examined her on propofol.  Her eyes were closed.  She has slight disconjugate eye movements, but she is unable to follow gaze.  She was able to follow commands consistently in the right hand.  She moves the right upper and lower extremities well purposefully.  She has flaccid weakness in the left upper  extremity and does not withdraw to pain.  She does withdraw the left lower extremity well to pain.  Left plantar is equivocal, right is downgoing. Coordination and gait cannot be tested.  DATA REVIEWED:  As stated above, cerebral angiogram findings and CT scan findings were reviewed.  INR on admission was 1.49.  Total cholesterol is 139, triglycerides 71, HDL 33, LDL 92.  Basic metabolic panel labs appear normal.  CBC is unremarkable.  IMPRESSION:  A 65 year old lady with cardioembolic right middle cerebral artery infarct due to middle cerebral artery occlusion from atrial fibrillation treated with intravenous and intra-arterial tissue plasminogen activator as well as mechanical embolectomy with Merci and Penumbra device with full  recanalization.  Multiple vascular risk factors of hypertension, atrial fibrillation, obesity.  PLAN:  The patient is critically ill, at significant risk for neurological worsening, brain edema, hemorrhage, morbidity, and death. I had a long discussion with the patient's daughter who is present at the bedside explaining her stroke etiology.  PROGNOSIS:  Plan for further treatment and answered questions.  I would recommend maintaining strict control of blood pressure.  Discontinue the arterial groin sheath today.  Start Cardizem for rate control.  Repeat CT scan of the head to distinguish from basal ganglia dye extravasation versus true hemorrhage.  Decision for extubation will be based upon results of this.  MRI scan of the brain later when the patient is stable enough to travel for MRI.  Question about reinitiation of warfarin will be made only after review of subsequent scans and if there is no hemorrhage.  I had a long discussion with Dr. Delton Coombes, critical care physician, as well as Dr. Corliss Skains, neuro interventionalist, about the patient's care and spent 1 hour of critical care time.  I will be following the patient for subsequent hospital stay.Pramod P. Pearlean Brownie, MD     PPS/MEDQ  D:  06/04/2010  T:  06/05/2010  Job:  [id number]  Electronically Signed by Delia Heady MD on 06/11/2010 11:18:38 AM

## 2010-06-14 LAB — URINE CULTURE
Colony Count: >=100000
Culture  Setup Time: 201203152035

## 2010-06-14 LAB — PROTIME-INR: INR: 1.57 — ABNORMAL HIGH (ref 0.00–1.49)

## 2010-06-15 LAB — CBC
HCT: 34.2 % — ABNORMAL LOW (ref 36.0–46.0)
MCH: 32.4 pg (ref 26.0–34.0)
MCV: 97.2 fL (ref 78.0–100.0)
Platelets: 96 10*3/uL — ABNORMAL LOW (ref 150–400)
RBC: 3.52 MIL/uL — ABNORMAL LOW (ref 3.87–5.11)
WBC: 18.2 10*3/uL — ABNORMAL HIGH (ref 4.0–10.5)

## 2010-06-15 LAB — PROTIME-INR
INR: 2.05 — ABNORMAL HIGH (ref 0.00–1.49)
Prothrombin Time: 23.3 seconds — ABNORMAL HIGH (ref 11.6–15.2)

## 2010-06-15 LAB — COMPREHENSIVE METABOLIC PANEL
ALT: 20 U/L (ref 0–35)
Albumin: 3 g/dL — ABNORMAL LOW (ref 3.5–5.2)
Alkaline Phosphatase: 81 U/L (ref 39–117)
BUN: 15 mg/dL (ref 6–23)
Calcium: 8.6 mg/dL (ref 8.4–10.5)
Glucose, Bld: 142 mg/dL — ABNORMAL HIGH (ref 70–99)
Potassium: 3.8 mEq/L (ref 3.5–5.1)
Sodium: 138 mEq/L (ref 135–145)
Total Protein: 7.4 g/dL (ref 6.0–8.3)

## 2010-06-16 LAB — DIFFERENTIAL
Eosinophils Absolute: 0.2 10*3/uL (ref 0.0–0.7)
Eosinophils Relative: 1 % (ref 0–5)
Lymphocytes Relative: 8 % — ABNORMAL LOW (ref 12–46)
Lymphs Abs: 1.5 10*3/uL (ref 0.7–4.0)
Monocytes Absolute: 1.8 10*3/uL — ABNORMAL HIGH (ref 0.1–1.0)

## 2010-06-16 LAB — CBC
HCT: 35 % — ABNORMAL LOW (ref 36.0–46.0)
MCH: 32.6 pg (ref 26.0–34.0)
MCHC: 33.4 g/dL (ref 30.0–36.0)
MCV: 97.5 fL (ref 78.0–100.0)
Platelets: 110 10*3/uL — ABNORMAL LOW (ref 150–400)
RDW: 14.4 % (ref 11.5–15.5)
WBC: 17.9 10*3/uL — ABNORMAL HIGH (ref 4.0–10.5)

## 2010-06-17 LAB — BASIC METABOLIC PANEL
BUN: 18 mg/dL (ref 6–23)
CO2: 28 mEq/L (ref 19–32)
Calcium: 8.5 mg/dL (ref 8.4–10.5)
Chloride: 101 mEq/L (ref 96–112)
Creatinine, Ser: 0.83 mg/dL (ref 0.4–1.2)
GFR calc Af Amer: >60 mL/min (ref >60)
Glucose, Bld: 103 mg/dL — ABNORMAL HIGH (ref 70–99)

## 2010-06-17 LAB — CBC
HCT: 33.3 % — ABNORMAL LOW (ref 36.0–46.0)
MCH: 32.1 pg (ref 26.0–34.0)
MCHC: 32.7 g/dL (ref 30.0–36.0)
MCV: 97.9 fL (ref 78.0–100.0)
Platelets: 134 10*3/uL — ABNORMAL LOW (ref 150–400)
RDW: 14.2 % (ref 11.5–15.5)

## 2010-06-18 LAB — PROTIME-INR: INR: 2.94 — ABNORMAL HIGH (ref 0.00–1.49)

## 2010-06-19 LAB — PROTIME-INR
INR: 3.28 — ABNORMAL HIGH (ref 0.00–1.49)
Prothrombin Time: 33.4 seconds — ABNORMAL HIGH (ref 11.6–15.2)

## 2010-06-20 LAB — PROTIME-INR
INR: 3.3 — ABNORMAL HIGH (ref 0.00–1.49)
Prothrombin Time: 33.6 seconds — ABNORMAL HIGH (ref 11.6–15.2)

## 2010-06-21 DIAGNOSIS — I6330 Cerebral infarction due to thrombosis of unspecified cerebral artery: Secondary | ICD-10-CM

## 2010-06-21 DIAGNOSIS — I4891 Unspecified atrial fibrillation: Secondary | ICD-10-CM

## 2010-06-21 DIAGNOSIS — N390 Urinary tract infection, site not specified: Secondary | ICD-10-CM

## 2010-06-21 DIAGNOSIS — I10 Essential (primary) hypertension: Secondary | ICD-10-CM

## 2010-06-21 LAB — PROTIME-INR: Prothrombin Time: 30.5 seconds — ABNORMAL HIGH (ref 11.6–15.2)

## 2010-06-21 LAB — BASIC METABOLIC PANEL
BUN: 12 mg/dL (ref 6–23)
CO2: 28 mEq/L (ref 19–32)
Calcium: 8.1 mg/dL — ABNORMAL LOW (ref 8.4–10.5)
Chloride: 101 mEq/L (ref 96–112)
Creatinine, Ser: 0.74 mg/dL (ref 0.4–1.2)

## 2010-06-22 LAB — PROTIME-INR: Prothrombin Time: 32.3 seconds — ABNORMAL HIGH (ref 11.6–15.2)

## 2010-06-23 DIAGNOSIS — I10 Essential (primary) hypertension: Secondary | ICD-10-CM

## 2010-06-23 DIAGNOSIS — I4891 Unspecified atrial fibrillation: Secondary | ICD-10-CM

## 2010-06-23 DIAGNOSIS — N390 Urinary tract infection, site not specified: Secondary | ICD-10-CM

## 2010-06-23 DIAGNOSIS — I6330 Cerebral infarction due to thrombosis of unspecified cerebral artery: Secondary | ICD-10-CM

## 2010-06-23 NOTE — Discharge Summary (Signed)
[name], [name]               ACCOUNT NO.:  [id number]  MEDICAL RECORD NO.:  [id number]           PATIENT TYPE:  I  LOCATION:  3033                         FACILITY:  [facility]  PHYSICIAN:  Pramod P. Pearlean Brownie, MD    DATE OF BIRTH:  Jan 31, 1946  DATE OF ADMISSION:  06/03/2010 DATE OF DISCHARGE:  06/09/2010                              DISCHARGE SUMMARY   DISCHARGE DIAGNOSES: 1. Right basal ganglia infarct secondary to right M1 occlusion status     post IV and IA tPA as well as mechanical clot extraction with     resultant hemorrhagic transformation. 2. Permanent atrial fibrillation. 3. Pulmonary emboli/deep venous thrombosis with inferior vena cava     filter. 4. Lower extremity arterial thrombi. 5. Hypertension. 6. Heparin-induced thrombocytopenia. 7. Lupus anticoagulant. 8. Open fixation of left radial fracture. 9. Bilateral femoral embolectomies.  DISCHARGE MEDICATIONS: 1. Diltiazem 60 mg q.6 h. 2. Protonix 40 mg a day. 3. Aspirin 81 mg a day. 4. Hold Coumadin for now, may resume in 1 week if neurologically     stable and CT without hyperdense hemorrhage. 5. Furosemide 20 mg b.i.d. 6. Metoprolol 25 mg 1/2 tablet b.i.d. 7. Potassium chloride 20 mEq twice a day.  STUDIES PERFORMED: 1. CT of the brain on admission shows possible dense right middle     cerebral artery.  No obvious acute hemispheric infarct or     intracranial hemorrhage. 2. Cerebral angiogram showed angiographically occluded right middle     cerebral artery at its origin.  There was endovascular     revascularization of occluded right middle cerebral artery using     superselective intracranial intra-arterial tPA infusion, mechanical     thrombectomy with the Merci retrieval device and the Penumbra     thrombectomy device. 3. CT of the brain following intervention shows acute hemorrhage     and/or contrast in the head of the caudate and right putamen     related to recent right MCA occlusion which was  recanalized with     interventional techniques.  No midline shift.  Followup CT at 24     hours shows mild decrease in hyperdensity of previously seen     infarct.  Subacute hemorrhage in the right caudate and putamen.  No     midline shift.  No other new abnormality. 4. MRI of the brain confirms right basal ganglia hemorrhage associated     with right basal ganglia, posterior limb of right internal capsule,     and mid-to-posterior right corona radiata ischemic infarct.  Local     mass effect upon the right lateral ventricle.  Small peripheral     nonhemorrhagic infarcts, right mid to posterior temporal lobe.     Scattered white matter type changes secondary to small-vessel     disease.  Last CT performed June 07, 2010 shows no increase in     hemorrhage in the right basal ganglia.  No evidence of extension of     the infarct or any other new infarct since previous exam. 5. Chest x-ray shows no acute findings. 6. EKG shows atrial fibrillation. 7. 2-D  echocardiogram shows ejection fraction of 55-60%.  No source of     embolus.  The patient is in atrial fibrillation. 8. Carotid Doppler shows no internal carotid artery stenosis.  LABORATORY STUDIES:  Chemistry with potassium 3.3, otherwise normal. INR on day of discharge 1.14.  INR on admission was 1.49.  Urine culture shows greater than 100,000 colonies of E-coli.  CBC with red blood cells 3.8, otherwise normal.  Hemoglobin A1c 6.3.  Cholesterol 139, triglycerides 71, HDL 33, and LDL 92.  Cardiac enzymes were negative.  HISTORY OF PRESENT ILLNESS:  [name] is a 65 year old Caucasian female who presented with sudden onset of left face, arm, and leg weakness at 2:15 p.m. on June 03, 2010.  She presented to [facility] as a code stroke and was seen within an hour of arrival.  She had a NIH stroke scale of 14.  She had a history of atrial fibrillation and was on Coumadin.  Her INR was suboptimal at 1.49.  CT of  the brain showed no acute abnormality but raised the question of a dense right middle cerebral artery sign.  Two-third dose IV tPA was initiated and she was taken for emergent cerebral angiogram by Dr. Kerby Nora.  He found a complete occlusion of the right middle cerebral artery at the M1 portion extending into the proximal M2 branches.  Under informed consent, Dr. Corliss Skains gave intra-arterial tPA followed by mechanical thrombectomy with the Merci retrieval device as well as with Penumbra device to achieve full recanalization.  Postprocedure CT shows a hyperdensity in the right basal ganglia which is due to the dye versus hemorrhage.  The patient had been kept sedated, intubated, and admitted to the neuro ICU.  HOSPITAL COURSE:  A 24-hour CT performed after tPA showed continued mild hemorrhage.  Aspirin was not given by the end of hospital day #2 secondary to the hemorrhagic transformation.  The patient's NIH stroke scale remained unchanged.  Her sheath was removed without difficulty that following day.  She was extubated the following day without difficulty.  [facility] was consulted given her history of atrial fibrillation for rate control.  Medications were felt to be appropriate and they were resumed when the patient was able to take p.o. They indicated that they would like the Coumadin started if it is possible.  For now, the patient is able to take aspirin 81 mg daily and in 1-week if CT of the head is negative for hyperdense hemorrhage, okay to resume Coumadin at that time.  The patient was evaluated by PT, OT, and Speech Therapy and felt to benefit from inpatient rehab.  She has a supportive family and arrangements were made for transfer.  Of note, on day of discharge, the patient was having difficulty with urination.  She had a UTI on admission and was treated with a full course of Rocephin, I and O cath as needed and checked bladder volumes.  This will be  continued on rehab and addressed.  The patient with significant dysphagia secondary to infarct.  After Speech Therapy evaluation, she was able to tolerate a dysphagia II honey- thick liquid diet.  For now, we will leave IV fluid with hopes to wean them as swallowing improves.  CONDITION ON DISCHARGE:  She is alert and oriented x3.  Normal speech. Normal language.  She has left facial droop.  Left upper extremity weakness is 1-2/5 with lower extremity is 2/5.  She has good right-sided movement. Her heart rate is irregularly regular.  Her breath sounds are clear.  DISCHARGE PLAN: 1. Discharge to rehab for ongoing PT, OT, and speech therapy. 2. Aspirin for secondary stroke prevention.  Okay to resume Coumadin     in 1 week after initial hemorrhage if     repeat CT is negative for hyperdense blood.  CT planned for June 11, 2010 on rehab. 3. Follow up with primary care physician within 1 month. 4. Follow up Dr. Pearlean Brownie in his office in 4 weeks.     Annie Main, N.P.   ______________________________ Sunny Schlein. Pearlean Brownie, MD    SB/MEDQ  D:  06/09/2010  T:  06/10/2010  Job:  [id number]  cc:   Candyce Churn, M.D. Rollene Rotunda, MD, [facility]  Electronically Signed by Annie Main N.P. on 06/22/2010 01:51:21 PM Electronically Signed by Delia Heady MD on 06/23/2010 11:07:47 AM

## 2010-06-24 LAB — CBC
HCT: 30.9 % — ABNORMAL LOW (ref 36.0–46.0)
MCV: 96 fL (ref 78.0–100.0)
RBC: 3.22 MIL/uL — ABNORMAL LOW (ref 3.87–5.11)
WBC: 7.4 10*3/uL (ref 4.0–10.5)

## 2010-06-25 DIAGNOSIS — N390 Urinary tract infection, site not specified: Secondary | ICD-10-CM

## 2010-06-25 DIAGNOSIS — I6330 Cerebral infarction due to thrombosis of unspecified cerebral artery: Secondary | ICD-10-CM

## 2010-06-25 DIAGNOSIS — I4891 Unspecified atrial fibrillation: Secondary | ICD-10-CM

## 2010-06-25 DIAGNOSIS — I10 Essential (primary) hypertension: Secondary | ICD-10-CM

## 2010-06-25 LAB — PROTIME-INR
INR: 1.95 — ABNORMAL HIGH (ref 0.00–1.49)
Prothrombin Time: 22.4 seconds — ABNORMAL HIGH (ref 11.6–15.2)

## 2010-06-26 LAB — PROTIME-INR: INR: 2.04 — ABNORMAL HIGH (ref 0.00–1.49)

## 2010-06-27 LAB — PROTIME-INR: Prothrombin Time: 20.5 seconds — ABNORMAL HIGH (ref 11.6–15.2)

## 2010-06-28 DIAGNOSIS — I4891 Unspecified atrial fibrillation: Secondary | ICD-10-CM

## 2010-06-28 DIAGNOSIS — I6330 Cerebral infarction due to thrombosis of unspecified cerebral artery: Secondary | ICD-10-CM

## 2010-06-28 DIAGNOSIS — N390 Urinary tract infection, site not specified: Secondary | ICD-10-CM

## 2010-06-28 DIAGNOSIS — I10 Essential (primary) hypertension: Secondary | ICD-10-CM

## 2010-06-28 LAB — BASIC METABOLIC PANEL
BUN: 5 mg/dL — ABNORMAL LOW (ref 6–23)
CO2: 26 mEq/L (ref 19–32)
Chloride: 105 mEq/L (ref 96–112)
Creatinine, Ser: 0.8 mg/dL (ref 0.4–1.2)
Glucose, Bld: 86 mg/dL (ref 70–99)

## 2010-06-29 LAB — BASIC METABOLIC PANEL
Chloride: 100 mEq/L (ref 96–112)
Creatinine, Ser: 0.79 mg/dL (ref 0.4–1.2)
GFR calc Af Amer: >60 mL/min (ref >60)
Potassium: 4.4 mEq/L (ref 3.5–5.1)

## 2010-06-29 LAB — PROTIME-INR
INR: 1.92 — ABNORMAL HIGH (ref 0.00–1.49)
INR: 2.61 — ABNORMAL HIGH (ref 0.00–1.49)
Prothrombin Time: 27.7 seconds — ABNORMAL HIGH (ref 11.6–15.2)

## 2010-06-30 LAB — PROTIME-INR: INR: 2.06 — ABNORMAL HIGH (ref 0.00–1.49)

## 2010-07-01 LAB — CBC
HCT: 35.7 % — ABNORMAL LOW (ref 36.0–46.0)
MCHC: 33.3 g/dL (ref 30.0–36.0)
RDW: 14.6 % (ref 11.5–15.5)

## 2010-07-01 LAB — BASIC METABOLIC PANEL
BUN: 7 mg/dL (ref 6–23)
CO2: 24 mEq/L (ref 19–32)
Chloride: 105 mEq/L (ref 96–112)
Creatinine, Ser: 0.97 mg/dL (ref 0.4–1.2)

## 2010-07-01 LAB — PROTIME-INR: INR: 2.49 — ABNORMAL HIGH (ref 0.00–1.49)

## 2010-07-02 DIAGNOSIS — I4891 Unspecified atrial fibrillation: Secondary | ICD-10-CM

## 2010-07-02 DIAGNOSIS — I10 Essential (primary) hypertension: Secondary | ICD-10-CM

## 2010-07-02 DIAGNOSIS — N390 Urinary tract infection, site not specified: Secondary | ICD-10-CM

## 2010-07-02 DIAGNOSIS — I6330 Cerebral infarction due to thrombosis of unspecified cerebral artery: Secondary | ICD-10-CM

## 2010-07-02 LAB — PROTIME-INR: Prothrombin Time: 32.9 seconds — ABNORMAL HIGH (ref 11.6–15.2)

## 2010-07-04 LAB — PROTIME-INR
INR: 2.96 — ABNORMAL HIGH (ref 0.00–1.49)
Prothrombin Time: 30.9 seconds — ABNORMAL HIGH (ref 11.6–15.2)

## 2010-07-05 LAB — PROTIME-INR
INR: 3.01 — ABNORMAL HIGH (ref 0.00–1.49)
Prothrombin Time: 31.3 seconds — ABNORMAL HIGH (ref 11.6–15.2)

## 2010-07-05 LAB — GLUCOSE, CAPILLARY: Glucose-Capillary: 89 mg/dL (ref 70–99)

## 2010-07-06 LAB — PROTIME-INR
Prothrombin Time: 29.7 seconds — ABNORMAL HIGH (ref 11.6–15.2)
Prothrombin Time: 26.1 seconds — ABNORMAL HIGH (ref 11.6–15.2)
Prothrombin Time: 26.6 seconds — ABNORMAL HIGH (ref 11.6–15.2)

## 2010-07-06 LAB — CBC
HCT: 31.4 % — ABNORMAL LOW (ref 36.0–46.0)
Hemoglobin: 10.9 g/dL — ABNORMAL LOW (ref 12.0–15.0)
MCHC: 34.9 g/dL (ref 30.0–36.0)
RDW: 13.5 % (ref 11.5–15.5)
WBC: 12.2 10*3/uL — ABNORMAL HIGH (ref 4.0–10.5)

## 2010-07-06 LAB — GLUCOSE, CAPILLARY
Glucose-Capillary: 100 mg/dL — ABNORMAL HIGH (ref 70–99)
Glucose-Capillary: 102 mg/dL — ABNORMAL HIGH (ref 70–99)
Glucose-Capillary: 74 mg/dL (ref 70–99)
Glucose-Capillary: 97 mg/dL (ref 70–99)

## 2010-07-06 LAB — APTT: aPTT: 84 seconds — ABNORMAL HIGH (ref 24–37)

## 2010-07-07 DIAGNOSIS — I4891 Unspecified atrial fibrillation: Secondary | ICD-10-CM

## 2010-07-07 DIAGNOSIS — I6330 Cerebral infarction due to thrombosis of unspecified cerebral artery: Secondary | ICD-10-CM

## 2010-07-07 DIAGNOSIS — I10 Essential (primary) hypertension: Secondary | ICD-10-CM

## 2010-07-07 DIAGNOSIS — N390 Urinary tract infection, site not specified: Secondary | ICD-10-CM

## 2010-07-07 LAB — BETA-2-GLYCOPROTEIN I ABS, IGG/M/A
Beta-2 Glyco I IgG: <4 U/mL (ref <20)
Beta-2-Glycoprotein I IgA: 5 U/mL (ref <10)
Beta-2-Glycoprotein I IgM: <4 U/mL (ref <10)

## 2010-07-07 LAB — CBC
HCT: 46.6 % — ABNORMAL HIGH (ref 36.0–46.0)
HCT: 31.9 % — ABNORMAL LOW (ref 36.0–46.0)
HCT: 35.6 % — ABNORMAL LOW (ref 36.0–46.0)
Hemoglobin: 16 g/dL — ABNORMAL HIGH (ref 12.0–15.0)
MCHC: 34.4 g/dL (ref 30.0–36.0)
MCHC: 35 g/dL (ref 30.0–36.0)
MCV: 97.7 fL (ref 78.0–100.0)
MCV: 96.1 fL (ref 78.0–100.0)
MCV: 97.5 fL (ref 78.0–100.0)
Platelets: 128 10*3/uL — ABNORMAL LOW (ref 150–400)
Platelets: 129 10*3/uL — ABNORMAL LOW (ref 150–400)
Platelets: 130 10*3/uL — ABNORMAL LOW (ref 150–400)
RBC: 4.76 MIL/uL (ref 3.87–5.11)
RBC: 3.96 MIL/uL (ref 3.87–5.11)
RDW: 14 % (ref 11.5–15.5)
WBC: 12.1 10*3/uL — ABNORMAL HIGH (ref 4.0–10.5)
WBC: 13.1 10*3/uL — ABNORMAL HIGH (ref 4.0–10.5)

## 2010-07-07 LAB — PROTIME-INR
INR: 1.2 (ref 0.00–1.49)
INR: 1.4 (ref 0.00–1.49)
INR: 2.4 — ABNORMAL HIGH (ref 0.00–1.49)
INR: 2.5 — ABNORMAL HIGH (ref 0.00–1.49)
Prothrombin Time: 17.3 seconds — ABNORMAL HIGH (ref 11.6–15.2)
Prothrombin Time: 27.6 seconds — ABNORMAL HIGH (ref 11.6–15.2)

## 2010-07-07 LAB — LUPUS ANTICOAGULANT PANEL
Drvvt confirmation: 1.36 Ratio — ABNORMAL HIGH (ref <1.18)
Lupus Anticoagulant: DETECTED — AB
PTT Lupus Anticoagulant: 135.7 secs — ABNORMAL HIGH (ref 36.3–48.8)

## 2010-07-07 LAB — URINALYSIS, ROUTINE W REFLEX MICROSCOPIC
Bilirubin Urine: NEGATIVE
Glucose, UA: NEGATIVE mg/dL
Glucose, UA: NEGATIVE mg/dL
Ketones, ur: 15 mg/dL — AB
Ketones, ur: NEGATIVE mg/dL
Leukocytes, UA: NEGATIVE
Nitrite: NEGATIVE
Protein, ur: 30 mg/dL — AB
Protein, ur: NEGATIVE mg/dL
Urobilinogen, UA: 1 mg/dL (ref 0.0–1.0)

## 2010-07-07 LAB — GLUCOSE, CAPILLARY
Glucose-Capillary: 104 mg/dL — ABNORMAL HIGH (ref 70–99)
Glucose-Capillary: 119 mg/dL — ABNORMAL HIGH (ref 70–99)
Glucose-Capillary: 124 mg/dL — ABNORMAL HIGH (ref 70–99)
Glucose-Capillary: 126 mg/dL — ABNORMAL HIGH (ref 70–99)
Glucose-Capillary: 137 mg/dL — ABNORMAL HIGH (ref 70–99)
Glucose-Capillary: 145 mg/dL — ABNORMAL HIGH (ref 70–99)
Glucose-Capillary: 151 mg/dL — ABNORMAL HIGH (ref 70–99)
Glucose-Capillary: 84 mg/dL (ref 70–99)

## 2010-07-07 LAB — BASIC METABOLIC PANEL
BUN: 13 mg/dL (ref 6–23)
CO2: 26 mEq/L (ref 19–32)
Chloride: 104 mEq/L (ref 96–112)
Chloride: 105 mEq/L (ref 96–112)
Creatinine, Ser: 1.16 mg/dL (ref 0.4–1.2)
Creatinine, Ser: 1.24 mg/dL — ABNORMAL HIGH (ref 0.4–1.2)
GFR calc Af Amer: 57 mL/min — ABNORMAL LOW (ref >60)
GFR calc non Af Amer: 47 mL/min — ABNORMAL LOW (ref >60)
Glucose, Bld: 117 mg/dL — ABNORMAL HIGH (ref 70–99)

## 2010-07-07 LAB — URINE MICROSCOPIC-ADD ON

## 2010-07-07 LAB — HEMOGLOBIN A1C
Hgb A1c MFr Bld: 6.2 % — ABNORMAL HIGH (ref 4.6–6.1)
Hgb A1c MFr Bld: 6.3 % — ABNORMAL HIGH (ref 4.6–6.1)
Mean Plasma Glucose: 131 mg/dL

## 2010-07-07 LAB — DIFFERENTIAL
Basophils Absolute: 0 10*3/uL (ref 0.0–0.1)
Eosinophils Relative: 0 % (ref 0–5)
Monocytes Relative: 8 % (ref 3–12)
Neutrophils Relative %: 82 % — ABNORMAL HIGH (ref 43–77)

## 2010-07-07 LAB — COMPREHENSIVE METABOLIC PANEL
BUN: 13 mg/dL (ref 6–23)
CO2: 25 mEq/L (ref 19–32)
Calcium: 8.2 mg/dL — ABNORMAL LOW (ref 8.4–10.5)
Creatinine, Ser: 0.84 mg/dL (ref 0.4–1.2)
GFR calc Af Amer: >60 mL/min (ref >60)
GFR calc non Af Amer: >60 mL/min (ref >60)
Glucose, Bld: 170 mg/dL — ABNORMAL HIGH (ref 70–99)

## 2010-07-07 LAB — CARDIOLIPIN ANTIBODIES, IGG, IGM, IGA: Anticardiolipin IgA: 23 [APL'U] (ref <13)

## 2010-07-07 LAB — APTT
aPTT: 80 seconds — ABNORMAL HIGH (ref 24–37)
aPTT: 85 seconds — ABNORMAL HIGH (ref 24–37)
aPTT: 92 seconds — ABNORMAL HIGH (ref 24–37)

## 2010-07-07 LAB — PROTEIN S, TOTAL: Protein S Ag, Total: 67 % — ABNORMAL LOW (ref 70–140)

## 2010-07-07 LAB — PROTHROMBIN GENE MUTATION

## 2010-07-07 LAB — PROTEIN C, TOTAL: Protein C, Total: 36 % — ABNORMAL LOW (ref 70–140)

## 2010-07-07 LAB — DIGOXIN LEVEL: Digoxin Level: 0.3 ng/mL — ABNORMAL LOW (ref 0.8–2.0)

## 2010-07-09 LAB — URINE CULTURE
Culture  Setup Time: 201204111515
Special Requests: NEGATIVE

## 2010-07-28 NOTE — Discharge Summary (Signed)
[name], BOSLER               ACCOUNT NO.:  [id number]  MEDICAL RECORD NO.:  [id number]           PATIENT TYPE:  I  LOCATION:  4028                         FACILITY:  [facility]  PHYSICIAN:  Ranelle Oyster, M.D.DATE OF BIRTH:  October 20, 1945  DATE OF ADMISSION:  06/09/2010 DATE OF DISCHARGE:  07/09/2010                              DISCHARGE SUMMARY   DISCHARGE DIAGNOSES: 1. Right basal ganglia hemorrhagic infarction status post right M1     occlusion with intravenous t-PA and Merci clot extraction. 2. Chronic Coumadin therapy. 3. Dysphagia -- resolved. 4. Chronic atrial fibrillation. 5. Congestive heart failure. 6. Enterococcus urinary tract infection as well as gram-negative rod     urinary tract infection. 7. Depression.  HISTORY OF PRESENT ILLNESS:  This is a 65 year old white female with atrial fibrillation, chronic Coumadin therapy admitted on June 03, 2010, with left-sided weakness.  Cranial CT scan with possible dense right middle cerebral artery infarction.  Cerebral angiogram with an M1 occlusion, INR on admission 1.49.  She did receive t-PA.  Underwent mechanical embolectomy with Merci device on June 03, 2010, per Dr. Corliss Skains.  MRI of the brain on June 05, 2010, with right basal ganglia and hemorrhage posterior limb, right internal capsule, and mid posterior right corona radiata infarction.  Carotid Doppler is negative for internal carotid artery stenosis.  Echocardiogram with ejection fraction 55-60% normal systolic function.  Neurology Services consulted, placed on aspirin therapy.  Followup cranial CT scan on June 07, 2010 with no increase in hemorrhage or evidence of extension.  As of June 07, 2010 per Neurology, Dr. Meryl Dare was planning to resume Coumadin therapy for history of atrial fibrillation and cerebrovascular accident.  She had been extubated, monitored followed by Speech Therapy, diet slowly advanced from a dysphagia 2 to honey thick liquid diet.   She was completing the course of intravenous Rocephin for urinary tract infection.  She was total assist for mobility.  She was admitted for comprehensive rehab program.  PAST MEDICAL HISTORY: 1. Chronic atrial fibrillation. 2. Remote pulmonary emboli with IVC filter. 3. Mitral regurgitation with atrial thrombus. 4. Hypertension. 5. Morbid obesity. 6. Congestive heart failure. 7. History of HIT as well as lupus anticoagulant. 8. No alcohol or tobacco.  ALLERGIES:  Subcutaneous HEPARIN.  SOCIAL HISTORY:  Lives alone.  She works at a [employer] cur[employer]. Daughter in area to assist if needed.  One-level home, 5-6 steps to entry.  Functional history prior to admission was independent, functional status upon admission to rehab services was +2, total assist bed mobility,+2 total assist transfers, ambulation not tested, moderate assist upper body, total assist lower body, activities of daily living.  MEDICATIONS:  Prior to admission were, 1. Lasix 20 mg twice daily. 2. Metoprolol 20 mg 1-1/2 tablet twice daily. 3. Aspirin 81 mg daily. 4. Cartia 300 mg daily. 5. Coumadin 6 mg daily.  PHYSICAL EXAMINATION:  VITAL SIGNS:  Blood pressure 126/87, pulse 117, temperature 98.3, and respiration is 20. GENERAL:  This was an alert female.  She is oriented to person, place, date of birth, follow basic commands, some decreased awareness of her deficits, deep tendon reflexes,  hypoactive.  Sensation decreased to light touch in the left. LUNGS:  Decreased breath sounds.  Clear to auscultation. CARDIAC:  Rate irregularly irregular. ABDOMEN:  Soft, nontender.  Good bowel sounds.  REHABILITATION HOSPITAL COURSE:  The patient was admitted to inpatient rehab services with therapies initiated on a 3-hour daily basis consisting of physical therapy, occupational therapy, speech therapy, and 24-hour rehabilitation nursing.  The following issues were addressed during the patient's rehabilitation stay.   Pertaining to Ms. Stodghill's right basal ganglia hemorrhagic infarction, remained stable.  She had undergone t-PA as well as Merci clot extraction for a right M1 occlusion identified on cerebral angiogram.  Her Coumadin had since been resumed for chronic atrial fibrillation after follow-up cranial CT scan.  Per Dr. Meryl Dare showed no additional bleeding in the region of the right basal ganglia infarction.  Her latest INR was 2.30 with a goal INR of 2.0-3.0. She did receive venous Doppler studies in lower extremities on June 09, 2010, were negative.  Her diet was slowly advanced as per followed by Speech Therapy to a mechanical soft, thin liquid diet, which she tolerated well.  She had been initially on intravenous fluids for hydration due to honey-thick liquids, these were discontinued.  Her blood pressure, heart rates remained controlled with routine follow-up and monitoring.  She was on Cardizem as well as Lopressor.  She had completed a course of antibiotic care for a Proteus urinary tract infection, changed from amoxicillin to Macrobid due to some nausea. However, a follow-up urine study completed due to some heavy concentration in her urine showed greater than 100,000 negative rods, antibiotics to be initiated.  During her hospital course, she exhibited no signs of fluid overload.  She had been placed on Celexa 10 mg daily for hospital depression related to her stroke with emotional support provided.  The patient received weekly collaborative interdisciplinary team conferences to discuss estimated length of stay, family teaching and any barriers to discharge.  She is minimal assist for bathing, supervision upper body, max assist lower body dressing, minimal systolic transfers, max assist toileting, ambulating 30 feet, minimum to moderate assist, and supervision to propel her wheelchair.  She did establish significant gains during her rehab stay.  The family again limited due to her  physical assistance, felt skilled nursing facility was needed with bed becoming available on July 09, 2010, and discharge taking place at that time.  LABORATORY DATA:  Latest labs showed an INR of 2.30.  Sodium 137, potassium 4.7, BUN 7, and creatinine 0.9.  Hemoglobin 11.9, hematocrit 35.7, and platelet 349,000, and WBC of 8.6.  DISCHARGE MEDICATIONS:  At time of dictation included, 1. Lopressor 12.5 mg p.o. b.i.d. 2. Aspirin 81 mg p.o. daily. 3. Cardizem CD 300 mg p.o. daily. 4. Protonix 40 mg p.o. daily. 5. Senokot 2 tablets p.o. at bedtime. 6. Reglan 5 mg p.o. before meals and at bedtime. 7. Celexa 10 mg p.o. daily. 8. Amoxicillin 250 mg p.o. t.i.d. with urine culture pending. 9. Coumadin 2.5 mg daily with a goal INR of 2.0-3.0.  DIET:  Her diet was mechanical soft with thin liquids.  SPECIAL INSTRUCTIONS:  The patient should continue to receive weekly prothrombin times while on Coumadin therapy for atrial fibrillation and stroke with a goal INR of 2.0-3.0.  She should follow up Dr. Faith Rogue at the outpatient rehab service office, appointment to be made. Dr. Delia Heady, Neurology Services 1 month, call for appointment.  Dr. Johnella Moloney Medical Management versus attending physician at skilled nursing  facility.     Mariam Dollar, P.A.   ______________________________ Ranelle Oyster, M.D.    DA/MEDQ  D:  07/08/2010  T:  07/08/2010  Job:  [id number]  cc:   Ranelle Oyster, M.D. Candyce Churn, M.D. Rollene Rotunda, MD, [facility] Pearlean Brownie, MD  Electronically Signed by Mariam Dollar P.A. on 07/09/2010 01:05:37 PM Electronically Signed by Faith Rogue M.D. on 07/28/2010 09:41:05 PM

## 2010-08-09 ENCOUNTER — Ambulatory Visit (INDEPENDENT_AMBULATORY_CARE_PROVIDER_SITE_OTHER): Payer: Self-pay | Admitting: Cardiology

## 2010-08-09 DIAGNOSIS — R0989 Other specified symptoms and signs involving the circulatory and respiratory systems: Secondary | ICD-10-CM

## 2010-08-09 DIAGNOSIS — Z7901 Long term (current) use of anticoagulants: Secondary | ICD-10-CM | POA: Insufficient documentation

## 2010-08-09 LAB — PROTIME-INR: INR: 2.2 — AB (ref <=1.1)

## 2010-08-10 NOTE — Op Note (Signed)
[name], [name] NO.:  [id number]   MEDICAL RECORD NO.:  [id number]          PATIENT TYPE:  INP   LOCATION:  3309                         FACILITY:  [facility]   PHYSICIAN:  Larina Earthly, M.D.    DATE OF BIRTH:  1945/08/15   DATE OF PROCEDURE:  07/22/2008  DATE OF DISCHARGE:                               OPERATIVE REPORT   PREOPERATIVE DIAGNOSIS:  Bilateral lower extremity arterial  insufficiency, acute.   POSTOPERATIVE DIAGNOSIS:  Bilateral lower extremity arterial  insufficiency, acute.   PROCEDURES:  1. Right iliac and femoral embolectomy with primary closure.  2. Left iliac embolectomy and left common femoral endarterectomy with      vein patch angioplasty.   SURGEON:  Larina Earthly, MD   ASSISTANT:  Nurse.   ANESTHESIA:  General endotracheal.   COMPLICATIONS:  None.   DISPOSITION:  To recovery room in stable.   INDICATIONS FOR THE PROCEDURE:  The patient is a 62-year white female  who presented to the [facility] Emergency Room in the morning of this  procedure with complaints of right leg discomfort and pain.  Initially,  she had a duplex which was negative and then subsequently had arterial  studies which revealed inaudible flow to both lower extremities.  She  did not have any complaints of her left leg.  She was asked to be taken  immediately to [facility] Emergency room where I saw her on her arrival.  The patient had a mottled right leg and no sensory and markedly  diminished motor function of her right leg.  She did have motor and  sensory intact in her left leg and no complaints in her left leg.  She  had absent femoral pulses bilaterally and no audible flow in her feet  bilaterally.  I explained the most likely this was embolus, and that  although she was not having any symptoms in her left leg, she had  inaudible flow and clearly had acute ischemia there as well.  It was  recommended that she undergo emergent surgical exploration for  restoration of flow.  I explained that this was certainly a limb-  threatening situation.  I explained that the attempts initially will be  made for embolectomy to the groin but may require inflow or distal  bypass.   PROCEDURE IN DETAIL:  The patient was taken to the operating room,  placed in supine position, where the area of the abdomen, both groins,  both legs were prepped and draped in the usual sterile fashion.  An  incision was made first over the common femoral artery on the right.  The patient was morbidly obese.  This was taken down to the common  superficial femoral and profunda femoris arteries and these were all  isolated with vessel loops.  There was no pulse in the femoral artery.  The common femoral artery was opened transversely and there was acute  clot present.  A #4 Fogarty catheter was passed proximally into the  aorta and the iliac vessel was thrombectomized with excellent inflow.  A  negative pass was obtained  and this was reoccluded.  The patient had a  heparin allergy, and therefore, was received Angiomax throughout the  course.  The Fogarty catheter was then passed all the way down to the  level of the foot and there was thrombus that was removed from the  superficial femoral artery.  There was good backbleeding.  When no  further clot was removed, the artery was reoccluded and the common  femoral artery was closed with a running 6-0 Prolene suture.  The clamps  were removed and excellent flow was noted at the anterior tibial,  posterior tibial of the foot.  Next, attention was turned to the left  groin.  A similar incision was made over the left common femoral artery  and carried down.  Again, there was no pulse in the common femoral  artery.  The common superficial femoral and profunda femoris arteries  were encircled with vessel loops.  Common femoral artery was opened  transversely and the artery was chronically occluded.  The patient did  have a large  collateral further proximally.  The common femoral artery  was exposed further proximally up to the level of the external iliac  under the inguinal ligament.  This artery was opened longitudinally up  onto this area and there was a lumen at this area.  A #4 Fogarty passed  proximally into the aorta and large amount of thrombus was removed with  excellent inflow.  The external iliac artery was occluded with a Henley  clamp.  The artery was then opened down onto the superficial femoral  artery and the superficial femoral artery was patent via collaterals as  was the profunda femoris artery.  This area was all endarterectomized  including down into the profunda femoris artery with good backbleeding.  The endarterectomy plane was divided at the level of the superficial  femoral artery and the plaque was tacked at this level with interrupted  6-0 Prolene sutures.  The saphenous vein was exposed through the same  incision and was harvested for vein patch.  Prior transverse incision in  the common femoral artery was closed on the lateral and medial aspect  with 6-0 Prolene suture.  Next, the vein was brought onto approximation  and was sewn as a patch angioplasty with a running 6-0 Prolene suture.  Prior to completion of the closure, the usual flushing maneuvers were  undertaken, then the anastomosis completed.  This restored flow to the  foot again with excellent anterior tibial and posterior tibial signals.  The wounds were irrigated with saline.  Hemostasis with electrocautery.  The wounds were closed with several layers of 2-0 Vicryl in the  subcutaneous tissue and the skin was closed with 3-0 subcuticular Vicryl  stitch.  Sterile dressing was applied, and the patient was taken to the  recovery room in stable condition.      Larina Earthly, M.D.  Electronically Signed     TFE/MEDQ  D:  07/22/2008  T:  07/23/2008  Job:  [id number]

## 2010-08-10 NOTE — Assessment & Plan Note (Signed)
[facility]                            CARDIOLOGY OFFICE NOTE   NAME:[name], [name]                      MRN:          [id number]  DATE:09/21/2007                            DOB:          1945/08/13    PRIMARY CARE PHYSICIAN:  Candyce Churn, M.[name].   REASON FOR PRESENTATION:  Evaluate patient with atrial fibrillation and  hypertension.   HISTORY OF PRESENT ILLNESS:  The patient is pleasant 65 year old.  Since  I last saw her, she was hospitalized with pneumonia.  Since that time,  she has not been in the hospital and has done relatively well.  She has  been trying to walk for exercise every day.  She does this before work.  She does not get any chest pain, neck, or arm discomfort.  She does not  have any palpitations, presyncope, or syncope.  She does not have any  PND or orthopnea.  She remains in atrial fibrillation but does not  really notice this.  I do note that her blood pressure is elevated as  described below.   PAST MEDICAL HISTORY:  1. Permanent atrial fibrillation.  2. Pulmonary emboli.  3. Mitral regurgitation (followed with an echocardiogram last June,      demonstrating a well-preserved ejection fraction.  She had some      mitral valve thickening with no significant gradient and no      significant regurgitation.  There was some moderate TR).   ALLERGIES:  HEPARIN.   MEDICATIONS:  1. Coumadin.  2. Aspirin 81 mg daily.  3. Digoxin 0.25 mg daily.  4. Lasix 20 mg b.i.[name].  5. Cartia 300 mg daily.  6. Potassium 20 mEq b.i.[name].   REVIEW OF SYSTEMS:  As stated in the HPI and otherwise negative for all  other systems.   PHYSICAL EXAMINATION:  GENERAL:  The patient is in no distress.  VITAL SIGNS:  Weight 236 pounds, body mass index 39, blood pressure  170/100, and heart rate 79 and regular.  HEENT:  Eyes are unremarkable.  Pupils equal, round, and reactive to  light.  Fundi not visualized.  Oral mucosa unremarkable.  NECK:   No jugular venous distention at 45 degrees.  Carotid upstroke  brisk and symmetrical, no bruits, and no thyromegaly.  LYMPHATICS:  No cervical, axillary, or inguinal adenopathy.  LUNGS:  Clear to auscultation bilaterally.  BACK:  No costovertebral angle tenderness.  CHEST:  Unremarkable.  HEART:  PMI not displaced or sustained, S1 and S2 are within normal  limits.  No S3 and no murmurs.  ABDOMEN:  Obese, positive bowel sounds, normal in frequency and pitch;  no bruits, rebound, guarding, midline pulsatile mass,  hepatomegaly, or  splenomegaly.  SKIN:  No rashes and no nodules.  EXTREMITIES:  2+ pulses, no edema, cyanosis, or clubbing.  NEURO:  Oriented to person, place, and time.  Cranial nerves II through  XII grossly intact, and motor grossly intact.   STUDIES:  EKG; atrial fibrillation, rate 77, axis within normal limits,  intervals within normal limits, and no acute ST-wave changes.   ASSESSMENT AND PLAN:  1. Atrial fibrillation.  The patient is in atrial fibrillation and has      a slightly elevated rate.  At this point she is going to continue      on the Coumadin and I am going to add metoprolol for blood pressure      control.  This will also help with rate control.  This can be      tritiated by her primary care doctor.  2. Hypertension as above.  She has been hypertensive at the last      couple of visits.  I will add metoprolol 25  mg b.i.[name]. and this can      again be tritiated by Dr. Kevan Ny who she is going to see in 2 weeks.  3. Mitral valve disease.  The patient has had no change to her valve      over serial  echos.  I think this can be followed with physical      exams and I will repeat an echo again perhaps in a year.  4. Obesity.  We talked about weight loss with diet and exercise and I      prescribed Weight watchers.  5. Followup.  I will see the patient again in 6 months or sooner if      needed.     Rollene Rotunda, MD, [facility]  Electronically Signed     JH/MedQ  DD: 09/21/2007  DT: 09/22/2007  Job #: [id number]   cc:   Candyce Churn, M.[name].

## 2010-08-10 NOTE — Assessment & Plan Note (Signed)
OFFICE VISIT   [name], [name]  DOB:  1945-08-27                                       10/31/2008  EAVWU#:[id number]   Patient presents today for continued follow-up of her bilateral femoral  embolectomies from embolus, presumed secondary to atrial fibrillation.  She has now completely healed both groin incisions.  She has palpable  popliteal and palpable dorsalis pedis pulses bilaterally.  She does  report some diminished stamina and some mild swelling in both lower  extremities.  She has been released to work with 4-hour per day maximum  through August and then no restrictions beginning in September.  She  does have long shifts of standing.  I explained to her that there is no  risk from this.  She will see [place] again on as-needed basis.   Larina Earthly, M.[name].  Electronically Signed   TFE/MEDQ  [name]:  10/31/2008  T:  11/03/2008  Job:  [id number]

## 2010-08-10 NOTE — Assessment & Plan Note (Signed)
OFFICE VISIT   RADIE, BERGES D  DOB:  1945-05-10                                       08/29/2008  EAVWU#:[id number]   The patient presents for followup of her urgent surgery on 07/22/2008  for bilateral femoral embolus.  She continues to have 2+ dorsalis pedis  pulses.  The right groin is completely healed.  She continues to have  dressing changes to her left groin.  She does have a sinus tract and  there is some discolored drainage from this.  She is quite obese and  does have pannus over this area.  She is doing daily packing to this.  Home health is doing it 3 times a week and family is doing it otherwise.  This does track down approximately 2-3 cm.  We will switch from iodoform  to just straight gauze to minimize the drainage and the family said they  will do this twice a day rather than once a day.  She does have some  surrounding erythema and some discoloration and drainage.  I therefore  started her on Keflex 500 mg t.i.d. for 10 days.  I plan to see her  again in 2 weeks for continued followup.  I did discuss the possible  need for opening this up further to allow better packing.  She  understands this and we will continue to follow her as an outpatient.   Larina Earthly, M.D.  Electronically Signed   TFE/MEDQ  D:  08/29/2008  T:  08/29/2008  Job:  [id number]

## 2010-08-10 NOTE — Assessment & Plan Note (Signed)
OFFICE VISIT   WHIPKEY, Kawthar D  DOB:  1945/05/03                                       08/04/2008  UJWJX#:[id number]   REASON FOR VISIT:  Wound check.   HISTORY:  A 65 year old female who presented with bilateral acute  ischemia.  She underwent right iliac and femoral embolectomy with  primary closure and left iliac embolectomy and femoral endarterectomy  with vein patch angioplasty.  She was recently discharged from the  hospital.  She has noted a separation in her left groin incision.  I  spoke with her over the weekend and told her to come in today.   On examination, the right incision is well healed.  On the left, there  is a 1-cm skin opening.  This was probed.  It does not go very far.  Certainly no artery and vein patch is exposed.  There is no evidence of  infection.  I have recommended that we do packing of this area.  She is  instructed to keep both groins dry as she does have a large pannus and  is at risk for wound breakdown.  She is going to come back to see Dr.  Arbie Cookey in 1-1/2 weeks.   Jorge Ny, MD  Electronically Signed   VWB/MEDQ  D:  08/04/2008  T:  08/05/2008  Job:  [id number]   cc:   Larina Earthly, M.D.

## 2010-08-10 NOTE — Assessment & Plan Note (Signed)
[facility]                            CARDIOLOGY OFFICE NOTE   NAME:[name], [name]                      MRN:          [id number]  DATE:09/04/2006                            DOB:          [birth year]    PRIMARY CARE PHYSICIAN:  Dr. Marden Noble   REASON FOR PRESENTATION:  Patient with atrial fibrillation and mitral  valve disease.   HISTORY OF PRESENT ILLNESS:  The patient is a pleasant 65 year old white  female with a history of problems as above.  She has done relatively  well since I last saw her.  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  She denies any palpitations, presyncope or  syncope, though she will occasionally notice her heart rate to be  irregular at night.  She denies any shortness of breath.  She has had no  chest discomfort, neck discomfort, arm discomfort, activity induced  nausea or vomiting, excess diaphoresis.  She is not having any PND or  orthopnea.  She is tolerating Coumadin.  She did forget to take her  Cardizem today which may explain her increased heart rate.   PAST MEDICAL HISTORY:  Pulmonary emboli, atrial fibrillation, mitral  regurgitation, stenosis mild with rheumatic heart disease possibly.   ALLERGIES:  HEPARIN.   MEDICATIONS:  1. Coumadin.  2. Aspirin 81 mg daily.  3. Digoxin 0.25 mg daily.  4. Metformin 500 mg daily.  5. Potassium.  6. Lasix 20 mg daily.  7. Cartia 300 mg daily.   REVIEW OF SYSTEMS:  As stated in the HPI, otherwise negative for other  systems.   PHYSICAL EXAMINATION:  The patient is in no distress.  Blood pressure 171/107, heart rate 86 andregular.  HEENT:  Eyes unremarkable.  Pupils equal, round and reactive to light.  Fundi not visualized.  Oral mucosa unremarkable.  NECK:  No jugular venous distention at 45 degrees.  Carotid upstroke  brisk and symmetric.  No bruits, no thyromegaly.  LYMPHATICS:  No cervical, axillary, inguinal adenopathy.  LUNGS:   Clear to auscultation bilaterally.  BACK:  No costovertebral angle tenderness.  CHEST:  Unremarkable.  HEART:  PMI not displaced or sustained.  S1 and S2 within normal limits.  No S3, no clicks, no rubs, no murmurs.  ABDOMEN:  Obese, positive bowel sounds, normal in frequency and pitch.  No bruits, no guarding, no midline pulsatile mass, no hepatomegaly, no  splenomegaly.  SKIN:  No rashes, no nodules.  EXTREMITIES:  Two plus pulses throughout.  No edema, no cyanosis, no  clubbing.  NEURO:  Oriented to person, place and time.  Cranial nerves II through  XII grossly intact.  Motor grossly intact.   EKG:  Atrial fibrillation, rate 90s, poor anterior R wave progression,  low voltage in the chest lead, no acute ST wave change.   ASSESSMENT AND PLAN:  1. Atrial fibrillation.  The patient's ventricular rate is somewhat      high; however, she did not take her Cardizem.  She is tolerating      Coumadin.  At this point no further cardiovascular  testing is      suggested.  She will just get renewed prescription for her      Cardizem.  2. Obesity.  We discussed the need to lose weight with diet and      exercise.  I prescribed the  [facility] Diet.  3. Mitral valve disease.  I will get a repeat echocardiogram in July      as it has been 1 year.  I do notsuspect significant progression of      this, however.  4. Hypertension.  Blood pressure is elevated, but again she was not      taking her Cardizem.  She will keep an eye on this at home.  5. Followup.  I will see her back in 1 year or sooner if she has any      problems.     Rollene Rotunda, MD, [facility]  Electronically Signed    JH/MedQ  DD: 09/04/2006  DT: 09/05/2006  Job #: [id number]   cc:   Candyce Churn, M.D.

## 2010-08-10 NOTE — Assessment & Plan Note (Signed)
OFFICE VISIT   CHASE, KNEBEL D  DOB:  Jul 04, 1945                                       08/15/2008  ZOXWR#:[id number]   Shaquetta Arcos presents today for continued follow up of her acute  presentation with bilateral lower extremity arterial insufficiency on  07/22/2008.  She continues to do well and is walking without difficulty.  She has mild swelling distally.  Her right groin is completely healed.  The left groin continues to have some serous drainage from this.  She  did have an area of granulation tissue which I debrided, and this does  track down into a subcutaneous serous pocket of fat necrosis.  I have  packed this and instructed Ms. Human on the need for her keeping a  wick to keep this area open.  We have arranged for home health to see  her on a daily basis for this.  I will see her again in 2 weeks for  continued followup.  Fortunately, there is nothing prosthetic since she  had a closure with a vein patch in her left groin.  We will see her  again in 2 weeks for continued followup.   Larina Earthly, M.D.  Electronically Signed   TFE/MEDQ  D:  08/15/2008  T:  08/18/2008  Job:  [id number]   cc:   Candyce Churn, M.D.

## 2010-08-10 NOTE — H&P (Signed)
[name], [name]               ACCOUNT NO.:  [id number]   MEDICAL RECORD NO.:  [id number]          PATIENT TYPE:  EMS   LOCATION:  ED                           FACILITY:  [facility]   PHYSICIAN:  Corinna L. Lendell Caprice, MDDATE OF BIRTH:  03/20/1946   DATE OF ADMISSION:  11/30/2006  DATE OF DISCHARGE:                              HISTORY & PHYSICAL   CHIEF COMPLAINT:  Vomiting and diarrhea.   HISTORY OF PRESENT ILLNESS:  [name] is a 65 year old white female  with multiple medical problems who presents to the emergency room with  intractable vomiting.  She has also had diarrhea several times since  yesterday.  She has had fevers and chills.  She also has a cough  productive of yellow to green sputum.  She has not taken her medications  for several days and apparently tried yesterday but vomited everything  up.  Otherwise, she reports medical compliance.  She denies any chest  pain.  She denies any shortness of breath.  No sick contacts.   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   MEDICATIONS:  Lasix 20 mg daily, potassium chloride 10 mEq daily,  Coumadin 6 mg nightly except 9 mg on Sunday, Cartia XT 300 mg daily,  digoxin 0.25 mg daily.   SOCIAL HISTORY:  The patient denies a drinking and smoking history.   FAMILY HISTORY:  Noncontributory.   REVIEW OF SYSTEMS:  As above, otherwise, negative.   PHYSICAL EXAMINATION:  VITAL SIGNS:  Temperature 98.2, pulse initially  was 171, currently 100 on Cardizem drip, respiratory rate 18, blood  pressure 139/86, oxygen saturation 95% on room air.  GENERAL:  The patient is an uncomfortable appearing white female who is  coughing up greenish sputum.  HEENT:  Normocephalic, atraumatic.  Pupils equal, round, reactive to  light.  Sclerae nonicteric.  Moist mucous membranes.  Oropharynx without  erythema  or exudate.  NECK:  Supple, no lymphadenopathy, no JVD, no thyromegaly.  LUNGS:  Diminished, no wheezes, rhonchi, or rales.  CARDIOVASCULAR:  Irregularly irregular.  Difficult to assess for  murmurs, gallops, and rubs due to coughing.  ABDOMEN:  Soft, nontender, nondistended.  GU AND RECTAL:  Deferred.  EXTREMITIES:  No cyanosis, clubbing, and edema.  NEUROLOGICAL:  Alert and oriented, cranial nerves, sensory, and motor  exams were intact.  PSYCHIATRIC:  Normal affect.  SKIN:  No rash.   LABORATORY DATA:  White blood cell count 18,000, hemoglobin 16.4,  hematocrit 38.6, platelet count 229, 82% neutrophils, 9% lymphocytes.  INR 1.1, PTT 28.  Basic metabolic panel significant for a glucose of  142, the rest unremarkable.  Myoglobin greater than 500.  BNP 215.  CPK  MB 2.8, troponin less than 0.5.  Digoxin level 0.2.  Blood cultures  pending.  EKG shows atrial fibrillation with rapid ventricular response,  rate is 173.  Chest x-ray shows patchy left lower lobe infiltrate.   ASSESSMENT/PLAN:  1. Pneumonia.  The patient has received a dose of Rocephin, I  will      continue this, also get Azithromycin, follow up on culture results,      supportive care.  2. Nausea, vomiting, and diarrhea, suspect secondary to above.  I will      also check liver function tests, however, she has no abdominal pain      to suggest an intra-abdominal process.  She will get supportive      care, antiemetics, clear liquids for now.  3. Atrial fibrillation with rapid ventricular response.  She has a      history of chronic atrial fibrillation.  Certainly, her rapid rate      is related to a subtherapeutic digoxin level and vomiting of her      pills.  I also question whether she is, in fact, taking these      medications as prescribed as both her INR and digoxin level are      low.  Her rate is better controlled on Cardizem drip.  Certainly,      the pneumonia, vomiting, and probable intravascular volume       depletion are contributing.  I will give gentle IV hydration.  She      reportedly has a history of heart failure which was felt to be      related to the heart strain/pulmonary emboli previously.  Her last      echocardiogram shows an ejection fraction of 60% in June 2008.  She      has no signs of heart failure currently.  I will also give IV      digoxin load.  She will be admitted to telemetry.  4. Reported history of diabetes.  The patient's previous discharge      summary reports diabetes and she had been discharged on Metformin.      Apparently her blood sugars are better and her Metformin has been      stopped.  For now, I will just check CBGs and also check a      hemoglobin A1C.  5. History of pulmonary emboli, DVT, IVC filter, and lupus anti-      coagulant positive.  6. Reported history of HIT.  7. Hypertension.      Corinna L. Lendell Caprice, MD  Electronically Signed     CLS/MEDQ  D:  11/30/2006  T:  11/30/2006  Job:  [id number]   cc:   Candyce Churn, M.D.  Fax: [phone]   Rollene Rotunda, MD, [facility]  [address]  [address]  [place]  [place] [address]

## 2010-08-10 NOTE — Consult Note (Signed)
[name], [name] NO.:  [id number]   MEDICAL RECORD NO.:  [id number]          PATIENT TYPE:  INP   LOCATION:  2034                         FACILITY:  [facility]   PHYSICIAN:  Rollene Rotunda, MD, FACCDATE OF BIRTH:  06-Apr-1945   DATE OF CONSULTATION:  07/23/2008  DATE OF DISCHARGE:                                 CONSULTATION   PRIMARY CARE PHYSICIAN:  Candyce Churn, M.D.   REQUESTING PHYSICIAN:  Larina Earthly, M.D.   REASON FOR CONSULTATION:  Evaluate patient with tachy-brady rates.   HISTORY OF PRESENT ILLNESS:  Patient is a 65 year old white female known  to me.  She has a history of some mild mitral stenosis and permanent  atrial fibrillation.  She has also had a history of pulmonary emboli and  has been managed with anticoagulation over the years.  She has been  therapeutic on this, per her report, recently.  She presented on April  27 with leg pain.  She was subsequently found to have bilateral lower  extremity ischemia with right and left iliac and femoral emboli  requiring embolectomy.  Following surgery, she was in the PACU and noted  to have brady arrhythmia with rates in the 30's.  She has permanent  atrial fibrillation, and this was atrial fibrillation with a slow rate.  She was not symptomatic with this.  She has had a well-controlled rate  otherwise.  She did feel a little weak and dizzy postop but otherwise  today feels fine.  She has not had any recent presyncope or syncope.  She does not really notice her tachy arrhythmia.  She has not had any  chest discomfort, neck or arm discomfort.  She has had no shortness of  breath.  She has had a good exercise tolerance.   Of note, the patient was subtherapeutic on her Coumadin when she  presented.  She had been nauseated and throwing up for several days and  does not think she kept her pills down.  She also had diarrhea.   PAST MEDICAL HISTORY:  1. Permanent atrial fibrillation.  2. History of  pulmonary emboli.  3. Mild mitral regurgitation with mild mitral stenosis.  4. Mild-to-moderate tricuspid regurgitation.  5. Hypertension.  6. Obesity.  7. Deep venous thrombosis.   PAST SURGICAL HISTORY:  None.  She does have an IVC filter placed.   ALLERGIES:  HEPARIN.   MEDICATIONS PRIOR TO ADMISSION:  1. Digoxin 0.25 mg daily.  2. Coumadin.  3. Aspirin 81 mg daily.  4. Cardizem 240 mg daily.  5. Lasix 20 mg b.i.d.  6. Metoprolol 25 mg daily.  7. Potassium 10 mEq b.i.d.   SOCIAL HISTORY:  Patient lives in [place] alone.  She works in  Set designer.  She does not smoke cigarettes or drink alcohol.   FAMILY HISTORY:  Noncontributory for early coronary artery disease.   REVIEW OF SYSTEMS:  As stated in the HPI, otherwise negative for other  systems.   PHYSICAL EXAMINATION:  Patient is well-appearing in no distress.  Blood pressure 135/49, heart rate 74 and regular.  Afebrile.  Respiratory rate  15.  Saturation 97% on 1 liter.  HEENT:  Eyelids unremarkable.  Pupils are equal, round and reactive to  light.  Fundi not visualized.  Oral mucosa unremarkable.  NECK:  No jugular venous distention at 45 degrees.  Carotid upstroke  brisk and symmetric.  No bruits, no thyromegaly.  LYMPHATICS:  No cervical, axillary, or inguinal adenopathy.  LUNGS:  Clear to auscultation bilaterally.  BACK:  No costovertebral angle tenderness.  CHEST:  Unremarkable.  HEART:  PMI not displaced or sustained.  S1 and S2 within normal limits.  No S3, no clicks, no rubs, no murmurs.  ABDOMEN:  Obese with positive bowel sounds.  Normal in frequency and  pitch.  No bruits, rebound, guarding.  There are no midline pulsatile  masses.  No hepatomegaly, no splenomegaly.  SKIN:  No rashes, no nodules.  EXTREMITIES:  Pulses 2+.  No cyanosis, no clubbing, no edema.  NEUROLOGIC:  Grossly intact.   EKG:  Atrial fibrillation, rate 65, axis within normal limits.  Intervals within normal limits.  No acute ST-T  wave changes.   LABS:  PT 80.  Hemoglobin A1C 6.2.  Sodium 134, potassium 3.9, BUN 13,  creatinine 1.16, PT 1.4.   ASSESSMENT/PLAN:  1. Atrial fibrillation:  Patient has had some bradycardic rates      postoperative, but overall her rate is well controlled.  She is      currently being restarted on Refludan and Coumadin.  We will slowly      start back her outpatient regimen as her heart rate requires,      moving slowly, given the bradycardic rates.  We will follow along      for these recommendations.  2. Arterial thrombosis:  Patient has now had a history of deep venous      thrombosis and pulmonary emboli, and now the arterial thrombosis.      I will discuss this with Dr. Arbie Cookey.  We should consider      hypercoagulable workup.  She was subtherapeutic on her Coumadin,      however, secondary to her nausea and vomiting and inability to take      the pills.  If we did find any hypercoagulable state, our options      would be somewhat limited,  but we could at      the very least, use a higher dose of Coumadin in the future.  3. Mitral regurgitation/stenosis:  This has been mild.  A repeat      echocardiogram is pending.  4. Hypertension:  We will manage this in the context of treating her      arrhythmia.      Rollene Rotunda, MD, [facility]  Electronically Signed     JH/MEDQ  D:  07/23/2008  T:  07/23/2008  Job:  [id number]   cc:   Larina Earthly, M.D.

## 2010-08-10 NOTE — Discharge Summary (Signed)
[name], [name] NO.:  [id number]   MEDICAL RECORD NO.:  [id number]          PATIENT TYPE:  INP   LOCATION:  1438                         FACILITY:  [facility]   PHYSICIAN:  Candyce Churn, M.D.DATE OF BIRTH:  04-15-1945   DATE OF ADMISSION:  11/30/2006  DATE OF DISCHARGE:  12/04/2006                               DISCHARGE SUMMARY   DISCHARGE DIAGNOSES:  1. Left lower lobe pneumonia complicated by nausea vomiting,      diarrhea,and dehydration.  2. History of bilateral pulmonary emboli with pulmonary infarction.  3. History of lower extremity deep venous thrombosis with inferior      vena cava filter placement.  4. History of heparin-induced thrombocytopenia.  5. Atrial fibrillation.  6. Hypertension.   DISCHARGE MEDICATIONS:  1. Cardia XT 240 mg daily -- this is a lower dose than on admission.  2. Zegerid 40/100 one p.o. nightly.  3. Digoxin 250 mcg daily.  4. Coumadin 6 mg daily.  5. Lasix 20 grams daily.  6. Potassium chloride 10 mg daily.  7. Z-Pak -- take as directed.  8. Ceftin 500 mg p.o. b.i.d. for 5 days.   HOSPITAL COURSE:  [name] is a very pleasant 65 year old  female with multiple medical issues who presented to the emergency room  with intractable vomiting on November 30, 2006.  She has also had  diarrhea several times in the previous 24 hours.  She has had a feeling  of fever and chills and was coughing yellow-to-green sputum and chest x-  ray revealed left lower lobe infiltrate.  She also had some  bronchospasm.   The patient was admitted and started on IV Rocephin and azithromycin.  After 24 hours she was still having wheezing and bronchodilators were  started.   She did initially have a tachycardia that resolved with treatment for  pneumonia.  She had a  rapid ventricular response of atrial  fibrillation; and she was initially put on IV Cardizem and then switched  over to p.o. Cardizem.   She does have a history of  type 2 diabetes mellitus not mentioned above  and CBGs were controlled while hospitalized in the 80s to 120 range.  This was simply with a no concentrated sweet diet.   In terms of her history of pulmonary emboli.  Her ProTime was monitored  by pharmacy; and it was subtherapeutic on admission.  PAS, lower  extremity compression cuffs were used while hospitalized; and by  discharge her INR was 2.0 on 6 mg of Coumadin daily.   DISCHARGE LABORATORY DATA:  Revealed the following, chest x-ray from  November 30, 2006, revealed a patchy left lower lobe infiltrate and  changes consistent with bronchitis. ProTime on discharge was 22.9 with  an INR of 2.0.  Respiratory culture grew only oral pharyngeal flora.  CBC from December 02, 2006 revealed a white count 12,900, hemoglobin  13.2, platelet count 183,000. BMET from 09/06 revealed a sodium 139,  potassium 4.2, chloride 104, bicarb 28, glucose 111, BUN 11, creatinine  0.98 hemoglobin A1c from December 01, 2006 was well-controlled at 6.2.  Blood  blood cultures from November 30, 2006 showed no growth x1.  LFTs  from December 01, 2006 were within normal limits except for a slightly  low albumin at 2.6 which was likely an acute change from her nausea,  vomiting, and diarrhea for 24 hours prior to admission.  Cardiac markers  were negative while admitted with a troponin-I of 0.03 from November 30, 2006.  CK/MB was 1.9 and CK was 260.  Digoxin level from November 30, 2006 was low at 0.2.  BNP on November 30, 2006 was 215.0 pg/mL.   The patient had clear lungs on admission and was discharged home in  improved condition on December 04, 2006, to be followed up in the office  in several days for ProTime and to check her pulmonary status.  Room air  with O2 saturations on discharge were 97%.  Pulse was 77 and irregular.      Candyce Churn, M.D.  Electronically Signed     RNG/MEDQ  D:  12/04/2006  T:  12/04/2006  Job:  [id number]

## 2010-08-10 NOTE — Assessment & Plan Note (Signed)
OFFICE VISIT   [name], [name]  DOB:  Feb 26, 1946                                       09/12/2008  ZOXWR#:[id number]   The patient presents for bilateral femoral iliac embolectomy and left  common femoral artery embolectomy, endarterectomy and vein patch.  She  had had an open sinus tract and this is improving.  She underwent  dressing changes.  This today is quite small.  The tip of a Q-tip will  advance through this area and otherwise there is no sinus tract.  There  is minimal drainage.  She will continue to keep a wick in this and then  will discontinue this once it has healed.  Plan to see her again in 6  weeks.  If she is completely healed and back to her baseline she can  cancel that appointment.   Larina Earthly, M.[name].  Electronically Signed   TFE/MEDQ  [name]:  09/12/2008  T:  09/12/2008  Job:  [id number]

## 2010-08-10 NOTE — Discharge Summary (Signed)
[name], [name] NO.:  [id number]   MEDICAL RECORD NO.:  [id number]          PATIENT TYPE:  INP   LOCATION:  2034                         FACILITY:  [facility]   PHYSICIAN:  Larina Earthly, M.D.    DATE OF BIRTH:  12/31/1945   DATE OF ADMISSION:  07/22/2008  DATE OF DISCHARGE:  07/28/2008                               DISCHARGE SUMMARY   ADMISSION DIAGNOSES:  1. Bilateral lower extremity acute arterial insufficiency.  2. Atrial fibrillation.   FINAL DISCHARGE DIAGNOSES:  1. Acute bilateral lower extremity arterial insufficiency, status post      bilateral lower extremity embolectomies.  2. Arrhythmias including atrial fibrillation and bradycardia with      sinus pauses less than 3 seconds.  Followed is on chronic Coumadin      therapy and is followed by Dr. Antoine Poche with [facility].  3. History of heparin-induced thrombocytopenia with heparin allergy.  4. Mitral regurgitation/stenosis, moderate-to-moderate.  5. Hypertension.  6. Hyperglycemia with probable glucose intolerance with hemoglobin A1c      of 6.3 this admission.  7. History of bilateral pulmonary emboli with pulmonary infarction.  8. History of deep venous thrombosis with inferior vena cava filter      placement.  She is on chronic Coumadin therapy.  9  Obesity.  1. History of pyelonephritis in 2007.  2. History of the left lower lobe pneumonia in 2008.  3. Detected lupus anticoagulant.   PROCEDURES:  On July 22, 2008, right iliac and femoral embolectomy with  primary closure, and left iliac embolectomy and left common femoral  endarterectomy with vein patch angioplasty by Dr. Gretta Began.   BRIEF HISTORY:  [name] is a 65 year old Caucasian female who  presented to [facility] Emergency Department on July 22, 2008 with  complaints of right leg discomfort and pain.  Initially, she had a  duplex which was negative and then subsequently had arterial studies,  which revealed an  audible flow to both lower extremities.  She did not  have any complaints of her left leg.  She was taken mainly to [facility]  Emergency Department where she was seen by on-call vascular surgeon, Dr.  Gretta Began.  On exam, she had a mild right leg with no sensory and  markedly diminished motor function in her right leg.  She did have motor  and sensory function intact in her left leg and no complaints in her  left leg.  She had absent femoral pulses bilaterally with no audible  flow in her feet bilaterally.  She has history of AFib, atrial  fibrillation, she is now on Coumadin therapy.  It was felt that she  mostly likely had embolus and although she had no sensation in her left  leg, but she currently had acute ischemia there as well and was  recommend she go emergently to the operating room for surgical  exploration for restoration of flow.   HOSPITAL COURSE:  [name] was admitted to [facility] on  July 22, 2008.  She underwent the previously mentioned procedure.  From  recovery room, she  was taken to step-down unit 3300.  She remained  stable overnight.  There are no evidence of hematoma.  She had palpable  right dorsalis pedis pulse and positive Doppler signal in her both  posterior tibial and dorsalis pedis pulses.  She had motor sensory  function in both feet.  She did have persistent atrial fibrillation with  bradycardia with a rate as low as in the 40s.  We did request [facility]  [facility]iology consult as well as ordering a 2-D echocardiogram with her  finding of acute emboli.  We started her on Refludan for anticoagulation  therapy until her INR became therapeutic.  Pharmacy was asked to dose  this.  Note, she was also noted to have some hyperglycemia with blood  sugars 150s, 170s.  She reported she had a history of borderline  diabetes mellitus and we did put her on NovoLog insulin sliding scale.  Postoperatively as well, she had an increased in her creatinine,   although it still normal from 0.84-1.16.  She had low urine output  overnight and we did treat her with some fluids and kept her Foley  catheter in for monitoring.  Fortunately, her date urine output improved  over the next 24-48 hours and there were no other issues.  Her  urinalysis was negative for nitrates and leukocytes, but did show +30  protein.  In regards to her arrhythmias, she was continued to be  followed by [facility]iology throughout her admission.  She had a 2-D  echocardiogram on July 23, 2008, which showed mild LVH with systolic  function normal, estimated ejection fraction is 55-60%, poorly  visualized aortic valve, trivial regurgitation with mean gradient of 7  mmHg with peak gradient and (S peak gradient 60 mmHg).  Aortic root size  was normal.  Ascending aorta was normal.  Mitral valve showed calcified  annulus, mildly thickened leaflets, some findings consistent with mild-  to-moderate stenosis with no significant regurgitation.  Valve area by  pressure half time was 2.60 cm squared.  Index value by pressure half  time was 1.14 cm squared over mm squared, mean gradient 10 mmHg (D peak  gradient 20 mmHg).  Left atrium was moderately-to-severely dilated.  Atrial septum both from left to right consistent with increased left  atrial pressure.  Pulmonic valve and tricuspid valves were poorly  visualized.  The right atrium was mildly dilated.  No pericardial  effusion was appreciated.  She also remained on the telemetry unit.  She  overall had fairly good rate control, but did continue to have some  bradycardia, none that were really persistent, but some of low as in the  high 30s and 40s.  Subsequently, her Cardizem was decreased, this was  also done because she was having estimated 1-2 sinus pauses per day that  were all less than 3 seconds.  There was least consideration of need for  of a pacemaker at some point; however, Cardiology watched her for few  additional days  and overall, felt her rhythm remained stable and felt  that she could be followed up on an outpatient basis for 48-hour Holter  test.   In regards to her acute arterial insufficiency, her followup ABIs were  0.97 on the right, 0.102 on the left.  Her feet remained adequately  perfused.  Her incisions overall were healing well.  There were some  small amount of serosanguineous drainage from the left groin, but no  erythema on either groin, we continued daily dry dressing changes.  She  ultimately was ambulating fairly independently with just the use of a  rolling walker which was arranged for home.  We kept her on fluid until  her INR was therapeutic above 2.  The cardiologist did order a  hypercoagulable panel, which was drawn while she was on fluid in and  Coumadin.  The lupus anticoagulant was detected, homocystine level was  normal at 11.6, protein S functional was high at 200, and protein C  functional was slightly low at 74.  Due to her extensive history of bile  bilateral PE and DVT as well as acute arterial insufficiency, it was  felt she would need to be on anticoagulation therapy long term.   DISPOSITION:  Ultimately, [name] was felt appropriate for  discharge on Jul 28, 2008.  It was felt to be improving in stable  condition.  Overall, her heart rate was maintained in adequate rate in  the 50s to 80s in an atrial fibrillation.  Oxygen saturations were 97%  on room air.  She is afebrile.  Systolic blood pressures 120s to 150s,  diastolic in primarily the 70s.  INR was therapeutic at 2.2.  White  count was 12.2, hemoglobin 10.9, hematocrit 31.4, platelet count of 139.  Normal TSH of 0.621.  Sodium 135, potassium 4, BUN of 13, creatinine  1.12.   DISCHARGE MEDICATIONS:  1. Aspirin 81 mg p.o. daily.  2. Coumadin 6 mg p.o. daily.  3. Digoxin 0.25 mg daily.  4. Diltiazem CD 240 mg p.o. daily.  5. Furosemide 20 mg b.i.d.  6. Metoprolol 25 mg b.i.d.  7. Potassium chloride  20 mEq b.i.d.  8. Percocet 5/325 mg 1-2 tablets p.o. q.4 h. p.r.n. pain.   DISCHARGE INSTRUCTIONS:  Continue heart-healthy diet and discussed  continued follow with her family physician regarding her glucose  intolerance and possible need for diet education on this.  She may  shower and clean incisions gently with soap and water.  She could cover  her groin incisions daily with dry gauze dressing.  Avoid driving or  heavy lifting for the next 3 weeks.  She should see Dr. Arbie Cookey around  that time with postoperative ABIs.  She should call sooner if needed for  fever, redness, purulent drainage from incision site or recurrent leg  pain.  She is to see Dr. Antoine Poche on Aug 14, 2008 and 10:30 a.m.  She  has already been followed at [facility] and instructed to  call and get an appointment for PT/INR check this week.      Jerold Coombe, P.A.      Larina Earthly, M.D.  Electronically Signed    AWZ/MEDQ  D:  07/28/2008  T:  07/28/2008  Job:  [id number]   cc:   Rollene Rotunda, MD, [facility]  Candyce Churn, M.D.

## 2010-08-10 NOTE — H&P (Signed)
NAMEARYAN, BELLO NO.:  [id number]   MEDICAL RECORD NO.:  [id number]          PATIENT TYPE:  INP   LOCATION:  3309                         FACILITY:  [facility]   PHYSICIAN:  Larina Earthly, M.D.    DATE OF BIRTH:  Jan 20, 1946   DATE OF ADMISSION:  07/22/2008  DATE OF DISCHARGE:                              HISTORY & PHYSICAL   ADMISSION DIAGNOSIS:  Bilateral lower extremity acute arterial  insufficiency.   HISTORY OF PRESENT ILLNESS:  The patient is a 62-year white female who  presents to [facility] Emergency Room in the morning of the admission  with right leg pain.  She reported it had been going on all day and had  had some mild discomfort in this leg yesterday.  She denied any left leg  pain.  She does not have any prior history of ischemic legs.  She was  seen in the emergency department of [facility], initially felt  potentially have a DVT and had a duplex, which showed no evidence of  clot.  On further examination at [facility] it was felt that may have  arterial insufficiency and underwent arterial studies, which showed  bilateral lower extremity ischemia with no audible flow in her feet.  I  was consulted and she was brought immediately to the [facility]  Emergency Room.  I saw her on a presentation at the emergency  department.  It is clear that she had critically ischemic legs  bilaterally and was taken into the operating room.   PAST HISTORY:  Significant for congestive heart failure, hypertension,  DVT, and pulmonary embolus.  He does have a history of chronic atrial  fibrillation and pneumonia.  She was on chronic Coumadin.  She is not a  smoker and does not drink alcohol.   ALLERGIES:  Drug allergies are to heparin, which she says causes  itching, but was documentation in the chart with prior history of HIT.   CURRENT MEDICATIONS:  Aspirin, Coumadin, digoxin, diltiazem, Lasix, and  metoprolol.   PHYSICAL EXAMINATION:  GENERAL:  A  well-developed, alert, white female  appearing in stated age at 77.  She is morbidly obese.  EXTREMITIES:  Her radial pulses are 2+ bilaterally.  She has absent  femoral and distal pulses and inaudible flow on her feet bilaterally.  Right foot was mottled and she had no sensory and markedly diminished  motor function.  Left foot was intact in motor and sensory.   IMPRESSION:  Acute ischemia with bilateral lower extremities, presumably  secondary to embolus.   PLAN:  The patient will be taken immediately to the operating room for  treatment.      Larina Earthly, M.D.  Electronically Signed     TFE/MEDQ  D:  07/22/2008  T:  07/23/2008  Job:  [id number]

## 2010-08-13 ENCOUNTER — Encounter: Payer: Self-pay | Admitting: Physician Assistant

## 2010-08-13 ENCOUNTER — Ambulatory Visit: Payer: Self-pay | Admitting: Cardiology

## 2010-08-13 ENCOUNTER — Encounter: Payer: Self-pay | Admitting: *Deleted

## 2010-08-13 LAB — POCT INR: INR: 1.7

## 2010-08-13 NOTE — Op Note (Signed)
[name], [name] NO.:  [id number]   MEDICAL RECORD NO.:  [id number]          PATIENT TYPE:  OIB   LOCATION:  2899                         FACILITY:  [facility]   PHYSICIAN:  Rollene Rotunda, M.D.   DATE OF BIRTH:  05-Jul-1945   DATE OF PROCEDURE:  10/10/2005  DATE OF DISCHARGE:  10/10/2005                                 OPERATIVE REPORT   PRIMARY CARE PHYSICIAN:  Dr. Johnella Moloney   PROCEDURE:  Electrical cardioversion.   INDICATIONS:  Symptomatic atrial fibrillation.   PROCEDURE NOTE:  Informed consent was obtained after describing risks and  benefits.  Dr. Katrinka Blazing provided anesthesia with sodium pentothal.  Airway was  maintained.  The patient was successfully cardioverted with 120 J of  biphasic energy x1.  This resulted in normal sinus rhythm with a prolonged P-  R interval; however, she maintains sinus rhythm for only a few minutes  (approximately 5 minutes) and subsequently reverted back to atrial  fibrillation.   At this point, the plan would be rate control and anticoagulation.  If she  is symptomatic in the future, she may need antiarrhythmic therapy.           ______________________________  Rollene Rotunda, M.D.     JH/MEDQ  D:  10/10/2005  T:  10/11/2005  Job:  [id number]   cc:   Candyce Churn, M.D.  Fax: [phone]

## 2010-08-13 NOTE — Discharge Summary (Signed)
[name], [name]               ACCOUNT NO.:  [id number]   MEDICAL RECORD NO.:  [id number]          PATIENT TYPE:  INP   LOCATION:  1416                         FACILITY:  [facility]   PHYSICIAN:  Mobolaji B. Bakare, M.D.DATE OF BIRTH:  12-07-1945   DATE OF ADMISSION:  08/29/2005  DATE OF DISCHARGE:  09/07/2005                                 DISCHARGE SUMMARY   PRIMARY CARE PHYSICIAN:  Lovenia Kim, D.O.   CONSULTS:  1.  Cardiology consult - Dr. Antoine Poche.  2.  Infectious disease consult - Dr. Orvan Falconer.   DIAGNOSES:  1.  Bilateral pulmonary embolism.  2.  Lower extremity deep vein thrombosis, status post IVC filter.  3.  Atrial fibrillation, new onset.  4.  Heparin-induced thrombocytopenia.  5.  Diabetes mellitus.  6.  Hypertension.  7.  Reactive leukocytosis.  8.  Cholelithiasis.  9.  Positive lupus anticoagulant.   PROCEDURES:  1.  Chest x-ray done on Aug 23, 2005 showed stable cardiomegaly with      interval improvement in pulmonary edematous changes.  CT scan of the      chest showed bilateral pulmonary emboli with evidence of right heart      strain and pulmonary artery hypertension, right middle and bilateral      lower lobe hyperfusion injuries (infarctions), small bilateral pleural      effusions, cardiomegaly, peri-bronchovascular nodules in the left upper      lobe, likely infection or inflammatory.  Follow up study in 6 months is      recommended to ensure resolution.  2.  Ultrasound of the abdomen showed permanent cholelithiasis without      pericholecystic fluid.  Murphy's sign was negative.  3.  Inferior vena cava filter placement by Dr. Elly Modena on August 30, 2005  4.  Follow up chest x-ray on September 01, 2005 showed new opacity in the mid lung      and left lung base, possible pneumonia and effusion.  5.  Follow up chest x-ray on September 03, 2005 showed worsening air space disease      of the left lung and included loculated pleural effusion laterally.  6.   Follow up chest x-ray on September 06, 2005 showed interval decrease in left      pleural effusion, new left lung air space process.  7.  CT scan of the chest on September 06, 2005 showed new left upper lobe air      space opacity, progressive left lower lobe air space disease, left      effusion.  Peripheral wedge-shaped densities in the right middle lobe      and right lower lobe are stable, compatible with pulmonary infarctions.  8.  2-D echocardiogram done on August 30, 2005 showed left ventricular systolic      function to be normal.  The right atrium was mildly dilated.  There was      mild mitral stenosis, moderate mitral valvular regurgitation with mid      transmitral gradient of 7.1 mmHg.  The inferior vena cava was mildly      dilated, and  __________ changes in inferior vena cava dimension were      absent.  9.  Lower extremity Dopplers showed DVT in posterior tibial vein to femoral      vein on the right.  There was a mobile clot in the distal common femoral      vein.  Positive DVT in left popliteal vein.   BRIEF HISTORY:  Please refer to the admission H&P for full details.  [name] presented to the emergency room with nausea and vomiting.  She is  65 years old.  No prior significant past medical history, except for  obesity.  She had severe nausea and vomiting on the day of presentation,  which was associated with right flank pain.  She was diagnosed  pyelonephritis initially in the emergency room.  She was noted to be  markedly tachycardic with sinus tachycardia in the range of 120s and atrial  fibrillation.  A D-dimer was obtained, and this was also elevated.  This was  followed by a CT scan which revealed severe multiple large bilateral  pulmonary emboli.  She denied any chest pain, though she has had significant  dyspnea on exertion for approximately 1-2 months.  She was seen previously  by [facility] cardiology and felt to be in new onset heart failure.  [name] has not  been on hormone replacement therapy.  She does not smoke  cigarettes, and does not have any knowledge of family history of blood clot.  She was subsequently admitted into the intensive care unit for  anticoagulation and further management.   HOSPITAL COURSE:  1.  Massive PE with bilateral DVTs.  The etiology and exacerbating factors      are still unclear.  The patient was covered on heparin infusion,      Coumadin.  She was noted to drop her platelets from 253 to 151.  This      was felt to be very significant.  Follow up platelets on day #2 of      heparin infusion showed platelets of 92.  Hence, heparin was      discontinued, and work-up for heparin-induced thrombocytopenia was      initiated.  Anticoagulation was then changed to Refludan.  Heparin      antibody screen came back positive, and confirmatory test was also      positive.  She was continued on Refludan and this overlapped for 48      hours after INR was therapeutic.  She did have right ventricular strain      on 2-D echocardiogram, as recorded above.  She had significant pulmonary      infarction.  She was felt to be suitable candidate for IVC filter.  This      was performed by intervention radiology.  Respiratory wise, Mrs.      [name] continued to improve, despite the significant infarction on CT      scan and chest x-ray.  She did not require oxygen at the time of      discharge.  2.  Anticoagulable work-up was negative for Factor V gene mutation.  Protein      C was low.  Lupus anticoagulant was detected.  Anticardiolipin antibody      was elevated greater than 19 (greater than 13) positive.  It was      negative for prothrombin II gene mutation.  Rheumatoid factor was      negative.  Antinuclear antibody was also negative.  It should  be noted      that these anticoagulable profiles were run after the patient received      anticoagulation, but, nevertheless, lupus anticoagulant being positive,     and  anticardiolipin antibody also being positive and significant, and      this is felt to be the etiology of a thromboembolic phenomenon.  The      patient had multiple chest x-rays, and they were suggestive of      consolidation, pleural effusion, and probable pneumonia.  Clinically,      [name] did not exhibit features of pneumonia.  She was not      terribly sick, given the degree of opacity noted on the imaging studies.      She was empirically started on vancomycin and Zosyn at some point.      These were discontinued, and she was observed for 72 hours without any      fever, no worsening shortness of breath.  Infectious disease was      consulted, and it was felt that it is appropriate to discontinue      antibiotics, since the patient did not exhibit clinical features to      suggest pneumonia.  The imaging studies were probably infarction.  3.  Diabetes mellitus.  This is a new diagnosis for [name].  Her      fasting blood sugar CBGs were noted to be elevated.  Hemoglobin A1C was      6.4.  She was started on metformin, a low dose of 500 mg daily.  4.  New onset atrial fibrillation.  She was seen in consultation by      cardiology, Dr. Antoine Poche.  The patient was started on Cardizem.  She      responded well to Cardizem.  In addition, she was placed on low-dose      baby aspirin, in addition to Coumadin.  She continued to be in atrial      fibrillation during the course of hospitalization.  The atrial      fibrillation was probably triggered off by the pulmonary status;      however, she did not convert to normal sinus rhythm upon treatment of      pulmonary status.  She will follow up with Dr. Antoine Poche in the office,      and she will also follow up with the [facility]      Cardiology for further Coumadin dosing.  Heart rate was controlled prior      to discharge.  5.  Congestive heart failure.  The patient was on Lasix and potassium      supplement  prior to hospitalization.  This was continued.  She did have      an elevated BNP of 409.  This is probably contributed significantly by      the right ventricular strain.  Chest x-ray did not reveal any pulmonary      vascular congestion or interstitial edema.   DISCHARGE CONDITION:  Stable.  The patient was ambulating and saturating at  95% on room air.  Blood pressure was stable.   DISCHARGE MEDICATIONS:  1.  Coumadin 6 mg until she follows up at the Coumadin Clinic.  2.  Aspirin 81 mg daily.  3.  Digoxin 0.25 mg daily.  4.  Cardizem 300 mg daily.  5.  Zegerid as before.  6.  Metformin 500 mg daily.  7.  Lasix.  8.  Potassium supplement.  DISCHARGE INSTRUCTIONS:  1.  The patient was instructed to follow up at the Coumadin Clinic on September 09, 2005 at 2 p.m.  Hence, further Coumadin dosing will be given.  2.  Also to follow up for the pleural effusion with a chest x-ray in 3-4      weeks to further evaluate. 3.  It was also recommended by the radiologist to have a follow up CT scan      of the chest in 6 months to further evaluate the peri-bronchovascular      node in the left upper lobe to ensure resolution.  4.  CBC and digoxin level during next visit with Dr. Elisabeth Most.  5.  She was given a prescription for a blood glucose meter to monitor CBG.      Mobolaji B. Corky Downs, M.D.  Electronically Signed     MBB/MEDQ  D:  09/16/2005  T:  09/16/2005  Job:  [id number]   cc:   Lovenia Kim, D.O.  Fax: [phone]   Rollene Rotunda, M.D.  [address]  [address]  [place]  [place] [address]

## 2010-08-13 NOTE — H&P (Signed)
NAMEGINNA, [name] NO.:  [id number]   MEDICAL RECORD NO.:  [id number]          PATIENT TYPE:  INP   LOCATION:  0101                         FACILITY:  [facility]   PHYSICIAN:  Lonia Blood, M.D.DATE OF BIRTH:  1946-03-26   DATE OF ADMISSION:  08/29/2005  DATE OF DISCHARGE:                                HISTORY & PHYSICAL   PRIMARY CARE PHYSICIAN:  Lovenia Kim, D.O.   CHIEF COMPLAINT:  Nausea and vomiting.   HISTORY OF PRESENT ILLNESS:  [name] is a very pleasant 65-year-  old female with a relatively insignificant past medical history. This  morning she developed the acute onset of severe nausea and vomiting. This  was associated with some right flank pain. Her nausea and vomiting were  severe enough that she presented to the emergency room for evaluation. She  was diagnosed with a pyelonephritis and Rocephin was initiated. The patient  was also found to be markedly tachycardiac however with a heart rate in  excess of 130. This was noted to be varying between atrial fibrillation and  sinus tachycardia. D-dimer was obtained and was noted to be elevated.  Because of this, the decision was made to pursue CT scan of the chest. CT  scan of the chest ultimately revealed severe multiple large bilateral  pulmonary emboli with evidence of significant right-sided heart strain. As a  result, I am called to admit the patient for ongoing care.   At the present time, the patient reports that her nausea and vomiting are  much better. She has no abdominal or flank pain now. She denies chest pain  surprisingly. She does admit that over the past month and a half to two  months, she has had significant dyspnea on exertion. She has not had any  chest pain however. She has not had significant shortness of breath at rest.  She has noticed however that daily activities and simple ambulation that  normally are not a problem have lead to significant dyspnea of late.  She  reports that in February 2007 she was diagnosed with a flu. She did not  require hospitalization and her symptoms lasted only a week. Then in March,  she was diagnosed with a pneumonia. She was treated with oral antibiotics  which she took and was able to continue working. She did not require  prolonged bedrest nor was she hospitalized. Approximately 3 weeks ago she  was told that she had fluid around her heart and some in her lungs. This  was after a chest x-ray was done when the patient apparently complained of  some dyspnea. She was being referred to [facility] for evaluation of  what was felt to be new onset heart failure. Again, however, she  specifically denies having any chest pain whatsoever at that time. There is  no known family history of blood clots to her knowledge. There is no known  history or family history of bleeding disorders. The patient is a nonsmoker  and is not on hormone replacement therapy. She has not been on any prolonged  trips recently. She  has never had a prior history of blood clots or  difficulty of this nature.   PAST MEDICAL HISTORY:  1.  Obesity.  2.  No prior history of surgical interventions of any nature.   OUTPATIENT MEDICATIONS:  1.  Lasix 20 mg p.o. daily started approximately 1 month ago for fluid      around the heart.  2.  Potassium chloride 10 mEq p.o. daily begun at the same time as Lasix.  3.  Zegerid 40/1,100 q.h.s.   ALLERGIES:  No known drug allergies.   FAMILY HISTORY:  The patient's mother is alive and lives with the patient.  She has diabetes but is otherwise healthy. The patient's father died from a  large myocardial infarction at the age of 32. The patient is one of 6 total  children all of whom are healthy. As stated above, there is no known family  history of blood clots or bleeding disorders.   SOCIAL HISTORY:  The patient does not smoke, she does not drink alcohol, she  lives with her mother in [place]. She  is currently working in a  Engineer, materials.   DATA REVIEWED:  BNP is elevated at 491, D-dimer is elevated at 7.08.  Urinalysis is positive with 11-20 white blood cells, 3-6 red blood cells,  greater than 300 protein, positive nitrate and small leukocyte esterase. INR  is 1.4, PTT is 36. Hemoglobin is 14.3 with MCV of 94. White count is  elevated at 22,000, platelet count is normal. Sodium, potassium chloride,  bicarb, BUN and creatinine are all normal. Calcium is 9.0. Serum glucose is  elevated at 174.  AST and ALT are normal. Alk phos is mildly elevated at  130, total bili is elevated at 2.1. Total protein is 6.9, albumin is 2.7.  Lipase is 12, CT scan of the chest reveals multiple large bilateral  pulmonary emboli with evidence of severe right heart strain and pulmonary  hypertension.   PHYSICAL EXAMINATION:  VITAL SIGNS:  Temperature 98.9, blood pressure  147/110 to 121/85, heart rate 86-120, respiratory rate 28, O2 sat is 98% on  2 liters per minute nasal cannula.  GENERAL:  Obese female with mild tachypnea but in no obvious acute  respiratory distress.  HEENT:  Normocephalic, atraumatic. Pupils equal round and reactive to light  and accommodation. Extraocular muscles intact bilaterally. OC/OP clear.  NECK:  Obese but there is no evidence of JVD appreciable.  LUNGS:  Clear to auscultation bilaterally without wheezes or rhonchi.  CARDIOVASCULAR:  Tachycardic, irregularly irregular without gallop or rub.  ABDOMEN: Morbidly obese, tender to deep palpation over the right upper and  lower quadrants. No rebound, no ascites.  EXTREMITIES:  1+ bilateral lower extremity edema perhaps somewhat worse on  the left lower extremity without evidence of erythema.  NEUROLOGIC:  5/5 strength bilateral upper and lower extremities. Intact  sensation to touch throughout. Alert and oriented x4. Cranial nerves II-XII  intact bilaterally. There is no Babinski.  IMPRESSION/PLAN:  1.  Large  numerous bilateral pulmonary emboli with right heart strain - Ms.      Winther is in fact very lucky to be with [place] her in the emergency room.      In fact, her pulmonary emboli were found by the ER physician chasing      down a tachycardia. At no time has the patient had any complaints of      chest pain. Her clot burden is very impressive. She hemodynamically  stable at the present time and therefore I do not feel that TPA is      currently warranted. She is however at high risk for further      decompensation should her clot burden worsen any. I am therefore placing      her in intensive care unit. She will be dosed with full dose IV heparin      and we will initiate Coumadin in approximately 24 hours. The patient      will be watched closely. I will obtain an echocardiogram to evaluate the      extent of her right heart failure and to followup any possible left-      sided heart failure. I will obtain lower extremity Doppler's as an      evaluation for lower extremity DVT. If the patient is found to have      large lower extremity DVT, we may need to entertain the possibility of      an IVC filter as further protection from recurrent embolization and      short course. The exact reason for the patient's DVT and pulmonary      emboli is not clear. Her only significant risk factor is obesity. I have      not initiated a hypercoagulable workup at this time. I do not feel that      labs would be accurate at this point. It would not be unreasonable to      simply anticoagulate this patient the remainder of her life anyway      because of the severity of her clot burden.  2.  Atrial fibrillation/sinus tachycardia - The patient seems to be      converting back and forth between atrial fibrillation and sinus      tachycardia. I feel this is most likely secondary to severe right heart      strain due to the patient's large pulmonary emboli. It is my hope that      with appropriate  management of her pulmonary emboli that her tachycardia      will resolve. I do not feel that negative chronotropic agents will      significantly impact her tachycardia at the present time as they will      not be addressing the true underlying issue. I will therefore not use      these agents unless the patient's heart rate climbs over 150 and is      sustained there.  3.  Pyelonephritis - The patient does have a clear pyelonephritis. She is      fortunate that she does in a sense and that the symptoms related to it,      namely nausea and vomiting, are the things that prompted her to visit      the emergency room. We will send urine for culture and we will cover the      patient with empiric Rocephin for now.  4.  Elevated total bilirubin with right-sided abdominal pain - The patient      does have an elevated total bili.  This may simply represent total body     dehydration. This could also be due to her large clot burden.      Nonetheless in a patient with right upper quadrant tenderness, we must      rule out the possibility of cholecystitis. Abdominal ultrasound will be      obtained. My suspicion is not however significantly high enough to  warrant coverage with clindamycin or other anaerobic agents at this      time.  5.  Elevated serum glucose - the patient has no prior history of diabetes to      her knowledge. Her serum glucose is markedly elevated at 174. It is my      hope that this is simply an acute stress reaction. We will however      follow CBGs in a q.a.c., q.h.s. basis and treat her with sliding scale      insulin as necessary. Hemoglobin A1c will be obtained.  6.  Obesity - After the patient's acute illness is stabilized, we will      discuss weight loss strategies and educate the patient on nutritional      choices prior to her discharge.      Lonia Blood, M.D.  Electronically Signed     JTM/MEDQ  D:  08/29/2005  T:  08/29/2005  Job:  [id number]    cc:   Lovenia Kim, D.O.  Fax: [phone]

## 2010-08-13 NOTE — Consult Note (Signed)
[name], [name] NO.:  [id number]   MEDICAL RECORD NO.:  [id number]          PATIENT TYPE:  INP   LOCATION:  0162                         FACILITY:  [facility]   PHYSICIAN:  Jonelle Sidle, M.D. LHCDATE OF BIRTH:  April 25, 1945   DATE OF CONSULTATION:  08/30/2005  DATE OF DISCHARGE:                                   CONSULTATION   REASON FOR CONSULTATION:  Atrial fibrillation.   HISTORY OF PRESENT ILLNESS:  [name] is a 65 year old woman with a  history of obesity, but no other reported major medical conditions, who was  actually scheduled to see Dr. Antoine Poche in clinic yesterday for evaluation of  possible congestive heart failure symptoms.  She had been experiencing  dyspnea on exertion over the last few months following the diagnosis of a  flu-like illness back in February, as well as apparently pneumonia.  She  is also recently status post evaluation for nausea and emesis with apparent  pyelonephritis, and was seen in the emergency department with some right  flank discomfort.  She was noted to be tachycardic with an elevated D-dimer  level and was referred for a CT scan of the chest.  This study revealed  bilateral pulmonary emboli with evidence of right heart strain and pulmonary  arterial hypertension, also associated with right middle and bilateral lower  lobe pulmonary infarctions.  Small bilateral pleural effusions were seen,  and there was also cardiomegaly.  She has been anticoagulated and is  undergoing evaluation by the hospitalist service in terms of further work-up  and management of her thromboembolic disease.  She is noted to be in atrial  fibrillation initially with fairly rapid ventricular response up into the  160s, although now on Diltiazem her heart rate is down to the 90s, and she  states that she is feeling much better.  An echocardiogram has been  performed today, results pending, but assessment of right and left  ventricular  function.  She is not aware of any history of documented  cardiovascular disease, myocardial infarction or previous dysrhythmia.   ALLERGIES:  No known drug allergies.   PRESENTING PROBLEM:  1.  Intravenous heparin.  2.  Aspirin 81 mg p.o. daily.  3.  Protonix 40 mg p.o. daily.  4.  Senokot S one tablet p.o. q.h.s.  5.  Rocephin 1 gm IV q.24h.  6.  Cardizem drip at 10 mg per hour.   PAST MEDICAL HISTORY:  As outlined in the history of present illness.   SOCIAL HISTORY:  The patient states that she lives with her mother here in  [place].  She denies any tobacco or alcohol use history.   FAMILY HISTORY:  Reportedly negative for any thromboembolic disease or  bleeding diathesis.  The patient's father died with a myocardial infarction  at age 65.   REVIEW OF SYSTEMS:  As described in the history of present illness.  She  denies any significant hemoptysis, orthopnea or PND.  She has no definitive  sense of palpitations.  She has had no exertional chest pain.   PHYSICAL EXAMINATION:  VITAL SIGNS:  Blood pressure 128/54, heart rate 90-  100, in atrial fibrillation.  Respirations 28.  Oxygen saturation is 96% on  2 liters.  Temperature 100.5 degrees.  GENERAL:  This is an otherwise woman lying in bed in no acute distress,  denying any active chest pain.  NECK:  Jugular venous pressure, approximately 8 cm of water.  No loud bruits  are noted.  LUNGS:  Diminished breath sounds at the bases.  No wheezes are noted.  CARDIAC:  An irregularly irregular rhythm without loud murmur, pericardial  rub or S3 gallop.  ABDOMEN:  Soft with normal bowel sounds.  No obvious hepatomegaly is noted.  There is no guarding or rebound.  EXTREMITIES:  Some venous stasis.  No obvious pitting edema.  Distal pulses  are 1 to 2+.  SKIN:  No ulcerative changes.  MUSCULOSKELETAL:  No kyphosis.  NEUROPSYCHIATRIC:  The patient is alert and oriented x3.   LABORATORY DATA:  WBCs 21.3, hemoglobin 12.6,  hematocrit 37.4, platelets  121.  INR of 1.4.  D-dimer was 7.08.  Sodium 134, potassium 3.7, chloride  101, bicarbonate 23, glucose 174, BUN 17, creatinine 1.2.  AST 31, ALT 28.  Troponin I peaked at 0.05 with normal CK-MB.  TSH 0.721.   IMPRESSION:  1.  Atrial fibrillation, duration uncertain, and initially with rapid      ventricular response.  This is the setting of fairly extensive bilateral      pulmonary emboli associated with pulmonary infarctions.  Apparently,      some degree of right heart strain, at least based on CT scan of the      chest.  Follow up echocardiogram is pending.  2.  No clearly documented history of cerebrovascular disease.  The patient      has experienced dyspnea on exertion over the last few months, perhaps      associated with the new diagnosis discussed above.  The troponin I level      has peaked at 0.05 in the setting of rapid atrial fibrillation and      significant thromboembolic disease.  BNP was 491.   RECOMMENDATIONS:  1.  From a cardiac perspective, agree with Diltiazem for heart rate control.      The primary issue will be treating the patient's pulmonary emboli, and      this will most likely have a positive impact on the patient's rhythm      disturbance.  She is being anticoagulated at present which should also      address both issues.  2.  Would follow an echocardiogram to assess both left and right ventricular      function.  3.  We will follow with you.  I am not certain that any additional ischemic      evaluation is required at this particular time.      Jonelle Sidle, M.D. [facility]  Electronically Signed     SGM/MEDQ  D:  08/30/2005  T:  08/30/2005  Job:  [id number]

## 2010-08-13 NOTE — Assessment & Plan Note (Signed)
[facility]                              CARDIOLOGY OFFICE NOTE   NAME:[name], [name]                      MRN:          [id number]  DATE:01/26/2006                            DOB:          [birth year]    PRIMARY:  Candyce Churn, M.D.   REASON FOR PRESENTATION:  Evaluate the patient with atrial fibrillation,  mitral regurgitation.   HISTORY OF PRESENT ILLNESS:  The patient returns for follow up of the above  problems.  I tried to cardiovert her on October 10, 2005.  However, she  maintained sinus rhythm only for five minutes.  We decided then to manage  her with rate control and anticoagulation.  She really does notice the  atrial fibrillation.  She does not feel any palpitations and has had no  presyncope or syncope.  She is having no excessive dyspnea, PND or  orthopnea.  She has no chest pain.  She says she is tired and has a little  less exercise tolerance than she recalls.  However, she is walking 30  minutes twice a day and doing housework.   PAST MEDICAL HISTORY:  1. Pulmonary emboli.  2. Atrial fibrillation.  3. Moderate mitral regurgitation on echocardiography with questionable      rheumatic disease and mild mitral stenosis.   ALLERGIES:  HEPARIN.   CURRENT MEDICATIONS:  1. Coumadin per our Coumadin Clinic.  2. Aspirin 81 mg a day.  3. Digoxin 0.25 mg a day.  4. Diltiazem 300 mg a day.  5. Metformin 500 mg a day.  6. Lasix 20 mg alternating with 40 mg.  7. Potassium 40 mEq every other day.   REVIEW OF SYSTEMS:  As stated in the HPI.  Otherwise negative for other  systems.   PHYSICAL EXAMINATION:  The patient is in no distress.  Blood pressure is  145/84, heart rate 77 and regular, weight 237 pounds.  Body mass index 38.  HEENT:  Eyes unremarkable.  Pupils are equal, round, and reactive to light.  Fundi not visualized.  Oral mucosa unremarkable.  NECK:  No jugular venous distention to 45 degrees.  Carotid upstroke  brisk  and symmetric.  No bruits, no thyromegaly.  LYMPHATICS:  No cervical or axillary inguinal adenopathy.  LUNGS:  Clear to auscultation bilaterally.  BACK:  No costovertebral angle tenderness.  CHEST:  Unremarkable.  HEART:  PMI not displaced or sustained.  S1-S2 within normal limits.  No S3,  no murmurs.  ABDOMEN:  Obese.  Positive bowel sounds.  Normal in frequency, pitch.  No  bruits, rebound, guarding, no midline pulsatile mass, no hepatomegaly or  splenomegaly.  SKIN:  No rashes.  No nodules.  EXTREMITIES:  2+ pulses throughout.  No clubbing, cyanosis, or edema.  NEUROLOGICAL:  Oriented to person, place and time.  Cranial nerves II  through XII grossly intact.  Motor grossly intact.   EKG:  Atrial fibrillation, rate 77, right axis deviation, poor anterior R-  wave progression, no acute ST or T-wave changes.   ASSESSMENT/PLAN:  1. Atrial fibrillation:  The patient does not have any symptoms  related to      this.  I will continue with rate control and anticoagulation. To insure      rate control, I will place a 24-hour Halter monitor.  2. Mitral regurgitation:  We discussed this today.  She does not have a      history of rheumatic heart disease.  She does not have any symptoms      related to this.  At this point, I will continue to follow this and we      will repeat an echocardiogram in June of next year.  3. Follow up:  I will see her in June of next year.  I will see her sooner      if she has any problems.    ______________________________  Rollene Rotunda, MD, [facility]    JH/MedQ  DD: 01/26/2006  DT: 01/26/2006  Job #: [id number]   cc:   Candyce Churn, M.D.

## 2010-08-16 ENCOUNTER — Other Ambulatory Visit: Payer: Self-pay

## 2010-08-16 ENCOUNTER — Ambulatory Visit: Payer: 59 | Admitting: Physician Assistant

## 2010-08-16 MED ORDER — WARFARIN SODIUM 3 MG PO TABS: ORAL_TABLET | ORAL | Status: DC

## 2010-08-16 MED ORDER — WARFARIN SODIUM 6 MG PO TABS: 6.0000 mg | ORAL_TABLET | ORAL | Status: DC

## 2010-08-18 ENCOUNTER — Encounter: Payer: Self-pay | Admitting: Physician Assistant

## 2010-08-18 ENCOUNTER — Ambulatory Visit (INDEPENDENT_AMBULATORY_CARE_PROVIDER_SITE_OTHER): Payer: 59 | Admitting: Physician Assistant

## 2010-08-18 VITALS — BP 116/74 | HR 78 | Ht 66.0 in | Wt 285.0 lb

## 2010-08-18 DIAGNOSIS — I4891 Unspecified atrial fibrillation: Secondary | ICD-10-CM

## 2010-08-18 DIAGNOSIS — R609 Edema, unspecified: Secondary | ICD-10-CM

## 2010-08-18 MED ORDER — POTASSIUM CHLORIDE ER 10 MEQ PO TBCR: 10.0000 meq | EXTENDED_RELEASE_TABLET | Freq: Every day | ORAL | Status: DC

## 2010-08-18 MED ORDER — FUROSEMIDE 40 MG PO TABS: 40.0000 mg | ORAL_TABLET | Freq: Every day | ORAL | Status: DC

## 2010-08-18 NOTE — Patient Instructions (Signed)
Your physician recommends that you schedule a follow-up appointment in: 3 MONTHS WITH DR. HOCHREIN AS PER SCOTT WEAVER, PA-C  Your physician has recommended you make the following change in your medication: START BACK ON FUROSEMIDE 40 MG 1 TABLET DAILY AND POTASSIUM 10 mEq 1 TABLET DAILY   YOU HAVE BEEN GIVEN A PRESCRIPTION FOR SOME BLOOD WORK TO BE DRAWN BY THE HOME HEALTH NURSE FOR A BMET 427.31, 782.3 IN 1 WEEK, PLEASE HAVE THE RESULTS FAXED TO OUR OFFICE [phone] [employer], PA-C.

## 2010-08-18 NOTE — Progress Notes (Signed)
History of Present Illness: Primary Cardiologist:  Dr. Rollene Rotunda  [name] is a 65 y.o. female with a history of atrial fibrillation, chronic Coumadin therapy, hypertension, DVTs and pulmonary emboli in the past, status post IVC filter with a question of positive lupus anticoagulant in the past who presented to [facility] 3/9 with evidence of a right MCA distribution stroke.  She was treated with tPA and thrombectomy.  Unfortunately, she had hemorrhagic transformation.  She remained intubated for a while.  She was eventually discharged to rehabilitation.  She spent almost one month in rehab.  Her recovery was fairly uneventful.  Cardiology did follow her for atrial fibrillation.  She was eventually placed back on Coumadin.  Of note, her INR was sub-therapeutic upon presentation with her stroke.  She returns today for follow up.  She seems to be doing well.  She is now at home.  She spent several weeks in inpatient rehabilitation as well as several weeks an outpatient rehabilitation facility.  She has physical therapy coming to her house.  Her left side is still quite weak.  Her speech seems to be better.  She denies chest pain.  She denies significant shortness of breath.  She denies orthopnea, PND.  She has chronic lower extremity edema especially on the left.  This is worsened since she was taken off furosemide in the hospital.  Of note, her creatinine was 0.97 while hospitalized.  Past Medical History  Diagnosis Date  . Obesity, unspecified   . PULMONARY EMBOLISM     History of IVC filter   . Atrial fibrillation     On chronic Coumadin  . ACUT VENUS EMBO&THROMB DEEP VES PROX LOWR EXTREM     Positive lupus anticoagulant  . Depression   . Diastolic heart failure     Echo 3/12: EF 55-60%; mild LVH, mild AI, mild MR, moderate to severe LAE, moderate RAE, PASP 44  . Atrial thrombosis   . Acute right MCA stroke 06/04/10    Treated with TPA and thrombectomy with hemorrhagic  transformation; Carotids 3/12 negative for ICA stenosis  . HTN (hypertension)   . HIT (heparin-induced thrombocytopenia)     Current Outpatient Prescriptions  Medication Sig Dispense Refill  . aspirin 81 MG tablet Take 81 mg by mouth daily.        Marland Kitchen diltiazem (CARDIZEM CD) 240 MG 24 hr capsule Take 240 mg by mouth daily.        . furosemide (LASIX) 20 MG tablet Take 20 mg by mouth 2 (two) times daily.        . metoprolol tartrate (LOPRESSOR) 25 MG tablet 1/2 tab po bid       . potassium chloride SA (K-DUR,KLOR-CON) 20 MEQ tablet Take 20 mEq by mouth 2 (two) times daily.        Marland Kitchen warfarin (COUMADIN) 3 MG tablet Take daily as directed by the anticoagulation clinic.  30 tablet  2    Allergies: Allergies  Allergen Reactions  . Heparin     Vital Signs: There were no vitals taken for this visit.  PHYSICAL EXAM: Well nourished, well developed, in no acute distress HEENT: normal Neck: no JVD At 90 degrees Cardiac:  normal S1, S2; Irregularly irregular rhythm; no murmur Lungs:  clear to auscultation bilaterally, no wheezing, rhonchi or rales Abd: soft, nontender, no hepatomegaly Ext: 1+ edema on the left and trace edema on the right; Calves soft and nontender Skin: warm and dry Neuro:  CNs 2-12 intact,  no focal abnormalities noted  EKG:  Atrial fibrillation, heart rate 78, normal axis, nonspecific ST-T wave changes  ASSESSMENT AND PLAN:

## 2010-08-18 NOTE — Assessment & Plan Note (Signed)
Rate is well controlled.  She continues on Coumadin which is managed by our Coumadin clinic.  She remains on diltiazem and metoprolol.  Follow up with Dr. Antoine Poche in 3 months.

## 2010-08-18 NOTE — Assessment & Plan Note (Signed)
She likely has chronic edema from chronic thrombophlebitis related to her past history of DVT.  She was previously on Lasix.  I will restart her on furosemide 40 mg daily and potassium 10 mEq daily.  She will have a basic metabolic panel obtained in one week.

## 2010-08-20 ENCOUNTER — Ambulatory Visit (INDEPENDENT_AMBULATORY_CARE_PROVIDER_SITE_OTHER): Payer: Self-pay | Admitting: Cardiology

## 2010-08-20 DIAGNOSIS — R0989 Other specified symptoms and signs involving the circulatory and respiratory systems: Secondary | ICD-10-CM

## 2010-08-25 ENCOUNTER — Telehealth: Payer: Self-pay | Admitting: Cardiology

## 2010-08-25 MED ORDER — DILTIAZEM HCL ER COATED BEADS 300 MG PO CP24: 300.0000 mg | ORAL_CAPSULE | Freq: Every day | ORAL | Status: DC

## 2010-08-25 NOTE — Telephone Encounter (Signed)
Pt needs diltiazem to be call in to [facility] # [phone]

## 2010-08-26 ENCOUNTER — Other Ambulatory Visit: Payer: Self-pay | Admitting: *Deleted

## 2010-08-26 DIAGNOSIS — I10 Essential (primary) hypertension: Secondary | ICD-10-CM

## 2010-08-26 MED ORDER — DILTIAZEM HCL ER COATED BEADS 300 MG PO CP24: 300.0000 mg | ORAL_CAPSULE | Freq: Every day | ORAL | Status: DC

## 2010-08-26 NOTE — Telephone Encounter (Signed)
rx sent in as requested 

## 2010-08-27 ENCOUNTER — Other Ambulatory Visit: Payer: Self-pay | Admitting: *Deleted

## 2010-08-27 ENCOUNTER — Ambulatory Visit (INDEPENDENT_AMBULATORY_CARE_PROVIDER_SITE_OTHER): Payer: Self-pay | Admitting: Cardiology

## 2010-08-27 DIAGNOSIS — R0989 Other specified symptoms and signs involving the circulatory and respiratory systems: Secondary | ICD-10-CM

## 2010-09-03 ENCOUNTER — Telehealth: Payer: Self-pay | Admitting: *Deleted

## 2010-09-03 NOTE — Telephone Encounter (Signed)
Olegario Messier RN from home health care States pt's skill nursing orders expires today. She would like to re- order one day a week for 3 more weeks and home health aid  2 days weeks for 3 more weeks. Olegario Messier states pt. Needs PT/INR on Monday.  Renew orders done by phone.

## 2010-09-06 ENCOUNTER — Ambulatory Visit (INDEPENDENT_AMBULATORY_CARE_PROVIDER_SITE_OTHER): Payer: Self-pay | Admitting: Cardiology

## 2010-09-06 DIAGNOSIS — R0989 Other specified symptoms and signs involving the circulatory and respiratory systems: Secondary | ICD-10-CM

## 2010-09-06 LAB — POCT INR: INR: 1.7

## 2010-09-13 ENCOUNTER — Ambulatory Visit (INDEPENDENT_AMBULATORY_CARE_PROVIDER_SITE_OTHER): Payer: Self-pay | Admitting: Internal Medicine

## 2010-09-13 DIAGNOSIS — R0989 Other specified symptoms and signs involving the circulatory and respiratory systems: Secondary | ICD-10-CM

## 2010-09-13 LAB — POCT INR: INR: 1.6

## 2010-09-20 ENCOUNTER — Ambulatory Visit (INDEPENDENT_AMBULATORY_CARE_PROVIDER_SITE_OTHER): Payer: Self-pay | Admitting: Cardiology

## 2010-09-20 DIAGNOSIS — R0989 Other specified symptoms and signs involving the circulatory and respiratory systems: Secondary | ICD-10-CM

## 2010-09-30 ENCOUNTER — Telehealth: Payer: Self-pay | Admitting: Cardiology

## 2010-09-30 ENCOUNTER — Ambulatory Visit (INDEPENDENT_AMBULATORY_CARE_PROVIDER_SITE_OTHER): Payer: Self-pay | Admitting: Internal Medicine

## 2010-09-30 DIAGNOSIS — R0989 Other specified symptoms and signs involving the circulatory and respiratory systems: Secondary | ICD-10-CM

## 2010-09-30 LAB — POCT INR: INR: 2

## 2010-09-30 NOTE — Telephone Encounter (Signed)
[name] from [facility] - [facility]  24.2  INR  2.0 .  Drawn today. [name] is Planning on d/c  Patient today. Pt has appt on 7/23 @ 11 a.m.

## 2010-10-01 NOTE — Telephone Encounter (Signed)
Spoke with pt's daughter.  She is aware of pt's Coumadin dose and upcoming appt.

## 2010-10-18 ENCOUNTER — Ambulatory Visit (INDEPENDENT_AMBULATORY_CARE_PROVIDER_SITE_OTHER): Payer: 59 | Admitting: *Deleted

## 2010-10-18 DIAGNOSIS — I4891 Unspecified atrial fibrillation: Secondary | ICD-10-CM

## 2010-10-18 DIAGNOSIS — I2699 Other pulmonary embolism without acute cor pulmonale: Secondary | ICD-10-CM

## 2010-10-18 LAB — POCT INR: INR: 1.8

## 2010-10-25 ENCOUNTER — Other Ambulatory Visit: Payer: Self-pay | Admitting: Cardiology

## 2010-10-29 ENCOUNTER — Ambulatory Visit (INDEPENDENT_AMBULATORY_CARE_PROVIDER_SITE_OTHER): Payer: 59 | Admitting: *Deleted

## 2010-10-29 DIAGNOSIS — I4891 Unspecified atrial fibrillation: Secondary | ICD-10-CM

## 2010-10-29 DIAGNOSIS — I2699 Other pulmonary embolism without acute cor pulmonale: Secondary | ICD-10-CM

## 2010-10-29 LAB — POCT INR: INR: 2.5

## 2010-11-12 ENCOUNTER — Ambulatory Visit (INDEPENDENT_AMBULATORY_CARE_PROVIDER_SITE_OTHER): Payer: 59 | Admitting: *Deleted

## 2010-11-12 DIAGNOSIS — I4891 Unspecified atrial fibrillation: Secondary | ICD-10-CM

## 2010-11-12 DIAGNOSIS — I2699 Other pulmonary embolism without acute cor pulmonale: Secondary | ICD-10-CM

## 2010-11-12 LAB — POCT INR: INR: 2.1

## 2010-11-15 ENCOUNTER — Ambulatory Visit: Payer: 59 | Attending: Internal Medicine | Admitting: Physical Therapy

## 2010-11-15 DIAGNOSIS — I69998 Other sequelae following unspecified cerebrovascular disease: Secondary | ICD-10-CM | POA: Insufficient documentation

## 2010-11-15 DIAGNOSIS — R269 Unspecified abnormalities of gait and mobility: Secondary | ICD-10-CM | POA: Insufficient documentation

## 2010-11-15 DIAGNOSIS — Z5189 Encounter for other specified aftercare: Secondary | ICD-10-CM | POA: Insufficient documentation

## 2010-11-15 DIAGNOSIS — M2420 Disorder of ligament, unspecified site: Secondary | ICD-10-CM | POA: Insufficient documentation

## 2010-11-15 DIAGNOSIS — M629 Disorder of muscle, unspecified: Secondary | ICD-10-CM | POA: Insufficient documentation

## 2010-11-15 DIAGNOSIS — R279 Unspecified lack of coordination: Secondary | ICD-10-CM | POA: Insufficient documentation

## 2010-11-15 DIAGNOSIS — M6281 Muscle weakness (generalized): Secondary | ICD-10-CM | POA: Insufficient documentation

## 2010-11-18 ENCOUNTER — Other Ambulatory Visit: Payer: Self-pay | Admitting: *Deleted

## 2010-11-18 ENCOUNTER — Ambulatory Visit (INDEPENDENT_AMBULATORY_CARE_PROVIDER_SITE_OTHER): Payer: 59 | Admitting: Cardiology

## 2010-11-18 ENCOUNTER — Encounter: Payer: Self-pay | Admitting: Cardiology

## 2010-11-18 DIAGNOSIS — I6350 Cerebral infarction due to unspecified occlusion or stenosis of unspecified cerebral artery: Secondary | ICD-10-CM

## 2010-11-18 DIAGNOSIS — Z8673 Personal history of transient ischemic attack (TIA), and cerebral infarction without residual deficits: Secondary | ICD-10-CM | POA: Insufficient documentation

## 2010-11-18 DIAGNOSIS — I639 Cerebral infarction, unspecified: Secondary | ICD-10-CM

## 2010-11-18 DIAGNOSIS — I4891 Unspecified atrial fibrillation: Secondary | ICD-10-CM

## 2010-11-18 MED ORDER — METOPROLOL TARTRATE 25 MG PO TABS: ORAL_TABLET | ORAL | Status: DC

## 2010-11-18 NOTE — Assessment & Plan Note (Signed)
I am delighted with her recovery.  No change in therapy is indicated.

## 2010-11-18 NOTE — Assessment & Plan Note (Signed)
She has had no new problems associated with this rhythm.  She tolerates and has had a therapeutic Coumadin level.  She will continue meds as listed.

## 2010-11-18 NOTE — Progress Notes (Signed)
HPI The patient presents for follow up after a CVA earlier this year.  She is doing well with residual left arm weakness and left facial droop.  However, she walks twice dialy.  The patient denies any new symptoms such as chest discomfort, neck or arm discomfort. There has been no new shortness of breath, PND or orthopnea. There have been no reported palpitations, presyncope or syncope.    Allergies  Allergen Reactions  . Heparin     Current Outpatient Prescriptions  Medication Sig Dispense Refill  . aspirin 81 MG tablet Take 81 mg by mouth daily.        . citalopram (CELEXA) 10 MG tablet Take 10 mg by mouth daily.        Marland Kitchen DIGOXIN PO Take by mouth daily.        Marland Kitchen diltiazem (CARDIZEM CD) 300 MG 24 hr capsule Take 1 capsule (300 mg total) by mouth daily.  30 capsule  11  . docusate sodium (COLACE) 100 MG capsule Take 100 mg by mouth daily.        . furosemide (LASIX) 40 MG tablet Take 1 tablet (40 mg total) by mouth daily.  30 tablet  11  . metoprolol tartrate (LOPRESSOR) 25 MG tablet Take 1/2 tablet twice a day  30 tablet  6  . potassium chloride (K-DUR) 10 MEQ tablet Take 1 tablet (10 mEq total) by mouth daily.  30 tablet  11  . warfarin (COUMADIN) 3 MG tablet TAKE AS DIRECTED EVERY DAY BY COUMADIN CLINIC  45 tablet  3    Past Medical History  Diagnosis Date  . Obesity, unspecified   . PULMONARY EMBOLISM     History of IVC filter   . Atrial fibrillation     On chronic Coumadin  . ACUT VENUS EMBO&THROMB DEEP VES PROX LOWR EXTREM     Positive lupus anticoagulant  . Depression   . Diastolic heart failure     Echo 3/12: EF 55-60%; mild LVH, mild AI, mild MR, moderate to severe LAE, moderate RAE, PASP 44  . Atrial thrombosis   . Acute right MCA stroke 06/04/10    Treated with TPA and thrombectomy with hemorrhagic transformation; Carotids 3/12 negative for ICA stenosis  . HTN (hypertension)   . HIT (heparin-induced thrombocytopenia)     Past Surgical History  Procedure Date  . Ivc  placement 08/30/2005   . Open reduction and internal fixation of left intra-articular   . Distal radius fracture. (12,/16/2010)     ROS:  As stated in the HPI and negative for all other systems.  PHYSICAL EXAM BP 130/70  Pulse 68  Resp 16  Ht 5\' 5"  (1.651 m)  Wt 251 lb (113.853 kg)  BMI 41.77 kg/m2GENERAL:  Well appearing HEENT:  Pupils equal round and reactive, fundi not visualized, oral mucosa unremarkable, upper dentures NECK:  No jugular venous distention, waveform within normal limits, carotid upstroke brisk and symmetric, no bruits, no thyromegaly LYMPHATICS:  No cervical, inguinal adenopathy LUNGS:  Clear to auscultation bilaterally BACK:  No CVA tenderness CHEST:  Unremarkable HEART:  PMI not displaced or sustained,S1 and S2 within normal limits, no S3, no S4, no clicks, no rubs, no murmurs ABD:  Flat, positive bowel sounds normal in frequency in pitch, no bruits, no rebound, no guarding, no midline pulsatile mass, no hepatomegaly, no splenomegaly EXT:  2 plus pulses throughout, mild left greater than right edema, no cyanosis no clubbing SKIN:  No rashes no nodules NEURO:  Left facial  droop and left arm weakness. PSYCH:  Cognitively intact, oriented to person place and time   EKG:  ASSESSMENT AND PLAN

## 2010-11-18 NOTE — Patient Instructions (Signed)
PLEASE STOP YOUR DIGOXIN CONTINUE ALL OTHER MEDICATIONS AS LISTED  Follow up in 6 MONTHS with Dr Antoine Poche.  You will receive a letter in the mail 2 months before you are due.  Please call us when you receive this letter to schedule your follow up appointment.

## 2010-11-22 ENCOUNTER — Ambulatory Visit: Payer: 59 | Admitting: Occupational Therapy

## 2010-11-25 ENCOUNTER — Ambulatory Visit: Payer: 59 | Admitting: Occupational Therapy

## 2010-11-25 ENCOUNTER — Ambulatory Visit: Payer: 59 | Admitting: *Deleted

## 2010-11-26 ENCOUNTER — Ambulatory Visit: Payer: 59 | Admitting: Physical Therapy

## 2010-12-01 ENCOUNTER — Ambulatory Visit: Payer: 59 | Admitting: Occupational Therapy

## 2010-12-01 ENCOUNTER — Ambulatory Visit: Payer: 59 | Attending: Internal Medicine | Admitting: Physical Therapy

## 2010-12-01 DIAGNOSIS — M6281 Muscle weakness (generalized): Secondary | ICD-10-CM | POA: Insufficient documentation

## 2010-12-01 DIAGNOSIS — M2420 Disorder of ligament, unspecified site: Secondary | ICD-10-CM | POA: Insufficient documentation

## 2010-12-01 DIAGNOSIS — R279 Unspecified lack of coordination: Secondary | ICD-10-CM | POA: Insufficient documentation

## 2010-12-01 DIAGNOSIS — Z5189 Encounter for other specified aftercare: Secondary | ICD-10-CM | POA: Insufficient documentation

## 2010-12-01 DIAGNOSIS — I69998 Other sequelae following unspecified cerebrovascular disease: Secondary | ICD-10-CM | POA: Insufficient documentation

## 2010-12-01 DIAGNOSIS — M629 Disorder of muscle, unspecified: Secondary | ICD-10-CM | POA: Insufficient documentation

## 2010-12-01 DIAGNOSIS — R269 Unspecified abnormalities of gait and mobility: Secondary | ICD-10-CM | POA: Insufficient documentation

## 2010-12-03 ENCOUNTER — Ambulatory Visit: Payer: 59 | Admitting: Physical Therapy

## 2010-12-03 ENCOUNTER — Ambulatory Visit: Payer: 59 | Admitting: Occupational Therapy

## 2010-12-07 ENCOUNTER — Ambulatory Visit: Payer: 59 | Admitting: *Deleted

## 2010-12-07 ENCOUNTER — Ambulatory Visit: Payer: 59 | Admitting: Occupational Therapy

## 2010-12-09 ENCOUNTER — Ambulatory Visit: Payer: 59 | Admitting: Occupational Therapy

## 2010-12-10 ENCOUNTER — Encounter: Payer: 59 | Admitting: *Deleted

## 2010-12-10 ENCOUNTER — Ambulatory Visit (INDEPENDENT_AMBULATORY_CARE_PROVIDER_SITE_OTHER): Payer: 59 | Admitting: *Deleted

## 2010-12-10 ENCOUNTER — Ambulatory Visit: Payer: 59 | Admitting: Physical Therapy

## 2010-12-10 DIAGNOSIS — I4891 Unspecified atrial fibrillation: Secondary | ICD-10-CM

## 2010-12-10 DIAGNOSIS — I2699 Other pulmonary embolism without acute cor pulmonale: Secondary | ICD-10-CM

## 2010-12-10 LAB — POCT INR: INR: 1.9

## 2010-12-13 ENCOUNTER — Ambulatory Visit: Payer: 59 | Admitting: Physical Therapy

## 2010-12-13 ENCOUNTER — Ambulatory Visit: Payer: 59 | Admitting: Occupational Therapy

## 2010-12-15 ENCOUNTER — Ambulatory Visit: Payer: 59 | Admitting: Physical Therapy

## 2010-12-15 ENCOUNTER — Ambulatory Visit: Payer: 59 | Admitting: Occupational Therapy

## 2010-12-16 ENCOUNTER — Ambulatory Visit: Payer: 59 | Admitting: Cardiology

## 2010-12-20 ENCOUNTER — Ambulatory Visit: Payer: 59 | Admitting: Occupational Therapy

## 2010-12-20 ENCOUNTER — Ambulatory Visit: Payer: 59 | Admitting: Physical Therapy

## 2010-12-22 ENCOUNTER — Ambulatory Visit: Payer: 59 | Admitting: Occupational Therapy

## 2010-12-27 ENCOUNTER — Ambulatory Visit: Payer: 59 | Admitting: Physical Therapy

## 2010-12-27 ENCOUNTER — Ambulatory Visit: Payer: 59 | Attending: Internal Medicine | Admitting: Occupational Therapy

## 2010-12-27 DIAGNOSIS — R269 Unspecified abnormalities of gait and mobility: Secondary | ICD-10-CM | POA: Insufficient documentation

## 2010-12-27 DIAGNOSIS — M2420 Disorder of ligament, unspecified site: Secondary | ICD-10-CM | POA: Insufficient documentation

## 2010-12-27 DIAGNOSIS — I69998 Other sequelae following unspecified cerebrovascular disease: Secondary | ICD-10-CM | POA: Insufficient documentation

## 2010-12-27 DIAGNOSIS — Z5189 Encounter for other specified aftercare: Secondary | ICD-10-CM | POA: Insufficient documentation

## 2010-12-27 DIAGNOSIS — R279 Unspecified lack of coordination: Secondary | ICD-10-CM | POA: Insufficient documentation

## 2010-12-27 DIAGNOSIS — M6281 Muscle weakness (generalized): Secondary | ICD-10-CM | POA: Insufficient documentation

## 2010-12-27 DIAGNOSIS — M629 Disorder of muscle, unspecified: Secondary | ICD-10-CM | POA: Insufficient documentation

## 2010-12-29 ENCOUNTER — Ambulatory Visit: Payer: 59 | Admitting: Physical Therapy

## 2010-12-29 ENCOUNTER — Ambulatory Visit: Payer: 59 | Admitting: Occupational Therapy

## 2011-01-04 ENCOUNTER — Ambulatory Visit: Payer: 59 | Admitting: Physical Therapy

## 2011-01-04 ENCOUNTER — Ambulatory Visit: Payer: 59 | Admitting: Occupational Therapy

## 2011-01-06 ENCOUNTER — Ambulatory Visit: Payer: 59 | Admitting: Physical Therapy

## 2011-01-06 ENCOUNTER — Encounter: Payer: 59 | Admitting: Occupational Therapy

## 2011-01-06 ENCOUNTER — Ambulatory Visit: Payer: 59 | Admitting: Occupational Therapy

## 2011-01-07 ENCOUNTER — Ambulatory Visit (INDEPENDENT_AMBULATORY_CARE_PROVIDER_SITE_OTHER): Payer: 59 | Admitting: *Deleted

## 2011-01-07 DIAGNOSIS — I2699 Other pulmonary embolism without acute cor pulmonale: Secondary | ICD-10-CM

## 2011-01-07 DIAGNOSIS — I4891 Unspecified atrial fibrillation: Secondary | ICD-10-CM

## 2011-01-07 LAB — HEPATIC FUNCTION PANEL
Indirect Bilirubin: 0.7
Total Protein: 6.7

## 2011-01-07 LAB — DIFFERENTIAL
Basophils Absolute: 0
Basophils Absolute: 0.1
Basophils Relative: 0
Eosinophils Relative: 0
Lymphocytes Relative: 9 — ABNORMAL LOW
Lymphs Abs: 1.6
Neutro Abs: 13.7 — ABNORMAL HIGH
Neutro Abs: 15 — ABNORMAL HIGH
Neutrophils Relative %: 82 — ABNORMAL HIGH
Neutrophils Relative %: 83 — ABNORMAL HIGH

## 2011-01-07 LAB — CBC
HCT: 39.5
Hemoglobin: 13.2
MCHC: 34
MCV: 95.8
Platelets: 177
Platelets: 229
RBC: 4.05
RBC: 4.29
RDW: 13.5
RDW: 13.7
RDW: 13.9
WBC: 11.8 — ABNORMAL HIGH
WBC: 18.3 — ABNORMAL HIGH

## 2011-01-07 LAB — CK TOTAL AND CKMB (NOT AT ARMC)
CK, MB: 1.9
Relative Index: 0.7
Total CK: 260 — ABNORMAL HIGH

## 2011-01-07 LAB — BASIC METABOLIC PANEL
BUN: 19
CO2: 26
Calcium: 8.3 — ABNORMAL LOW
Chloride: 105
GFR calc Af Amer: >60
GFR calc non Af Amer: 58 — ABNORMAL LOW
Glucose, Bld: 111 — ABNORMAL HIGH
Glucose, Bld: 142 — ABNORMAL HIGH
Potassium: 4.1
Sodium: 139
Sodium: 142

## 2011-01-07 LAB — CULTURE, RESPIRATORY W GRAM STAIN: Culture: NORMAL

## 2011-01-07 LAB — URINALYSIS, ROUTINE W REFLEX MICROSCOPIC
Glucose, UA: NEGATIVE
Ketones, ur: NEGATIVE
Protein, ur: NEGATIVE
Urobilinogen, UA: 1

## 2011-01-07 LAB — PROTIME-INR
INR: 1.1
INR: 1.2
INR: 1.7 — ABNORMAL HIGH
INR: 2 — ABNORMAL HIGH
Prothrombin Time: 14.9
Prothrombin Time: 15.6 — ABNORMAL HIGH

## 2011-01-07 LAB — POCT INR: INR: 1.5

## 2011-01-07 LAB — POCT CARDIAC MARKERS: Myoglobin, poc: >500

## 2011-01-07 LAB — TSH: TSH: 0.876

## 2011-01-07 LAB — APTT: aPTT: 28

## 2011-01-07 LAB — LIPID PANEL: Triglycerides: 98

## 2011-01-07 LAB — EXPECTORATED SPUTUM ASSESSMENT W GRAM STAIN, RFLX TO RESP C

## 2011-01-07 LAB — DIGOXIN LEVEL: Digoxin Level: 0.2 — ABNORMAL LOW

## 2011-01-07 LAB — CULTURE, BLOOD (ROUTINE X 2): Culture: NO GROWTH

## 2011-01-07 LAB — HEMOGLOBIN A1C: Mean Plasma Glucose: 143

## 2011-01-12 ENCOUNTER — Ambulatory Visit: Payer: 59 | Admitting: Occupational Therapy

## 2011-01-13 ENCOUNTER — Ambulatory Visit: Payer: 59 | Admitting: Occupational Therapy

## 2011-01-13 ENCOUNTER — Ambulatory Visit: Payer: 59 | Admitting: Physical Therapy

## 2011-01-18 ENCOUNTER — Ambulatory Visit: Payer: 59 | Admitting: Occupational Therapy

## 2011-01-19 ENCOUNTER — Ambulatory Visit: Payer: 59 | Admitting: Occupational Therapy

## 2011-01-20 ENCOUNTER — Ambulatory Visit: Payer: 59 | Admitting: Occupational Therapy

## 2011-01-20 ENCOUNTER — Encounter: Payer: 59 | Admitting: *Deleted

## 2011-01-21 ENCOUNTER — Ambulatory Visit (INDEPENDENT_AMBULATORY_CARE_PROVIDER_SITE_OTHER): Payer: 59 | Admitting: *Deleted

## 2011-01-21 DIAGNOSIS — I4891 Unspecified atrial fibrillation: Secondary | ICD-10-CM

## 2011-01-21 DIAGNOSIS — Z7901 Long term (current) use of anticoagulants: Secondary | ICD-10-CM

## 2011-01-21 DIAGNOSIS — I2699 Other pulmonary embolism without acute cor pulmonale: Secondary | ICD-10-CM

## 2011-01-21 LAB — POCT INR: INR: 1.8

## 2011-02-04 ENCOUNTER — Ambulatory Visit (INDEPENDENT_AMBULATORY_CARE_PROVIDER_SITE_OTHER): Payer: 59 | Admitting: *Deleted

## 2011-02-04 DIAGNOSIS — Z7901 Long term (current) use of anticoagulants: Secondary | ICD-10-CM

## 2011-02-04 DIAGNOSIS — I4891 Unspecified atrial fibrillation: Secondary | ICD-10-CM

## 2011-02-04 DIAGNOSIS — I2699 Other pulmonary embolism without acute cor pulmonale: Secondary | ICD-10-CM

## 2011-02-04 LAB — POCT INR: INR: 1.9

## 2011-02-14 ENCOUNTER — Telehealth: Payer: Self-pay | Admitting: Pharmacist

## 2011-02-14 MED ORDER — WARFARIN SODIUM 3 MG PO TABS: ORAL_TABLET | ORAL | Status: DC

## 2011-02-18 ENCOUNTER — Ambulatory Visit (INDEPENDENT_AMBULATORY_CARE_PROVIDER_SITE_OTHER): Payer: 59 | Admitting: *Deleted

## 2011-02-18 DIAGNOSIS — Z7901 Long term (current) use of anticoagulants: Secondary | ICD-10-CM

## 2011-02-18 DIAGNOSIS — I2699 Other pulmonary embolism without acute cor pulmonale: Secondary | ICD-10-CM

## 2011-02-18 DIAGNOSIS — I4891 Unspecified atrial fibrillation: Secondary | ICD-10-CM

## 2011-02-18 LAB — POCT INR: INR: 1.9

## 2011-03-11 ENCOUNTER — Ambulatory Visit (INDEPENDENT_AMBULATORY_CARE_PROVIDER_SITE_OTHER): Payer: 59 | Admitting: *Deleted

## 2011-03-11 DIAGNOSIS — I2699 Other pulmonary embolism without acute cor pulmonale: Secondary | ICD-10-CM

## 2011-03-11 DIAGNOSIS — Z7901 Long term (current) use of anticoagulants: Secondary | ICD-10-CM

## 2011-03-11 DIAGNOSIS — I4891 Unspecified atrial fibrillation: Secondary | ICD-10-CM

## 2011-04-08 ENCOUNTER — Encounter: Payer: 59 | Admitting: *Deleted

## 2011-04-21 ENCOUNTER — Ambulatory Visit (INDEPENDENT_AMBULATORY_CARE_PROVIDER_SITE_OTHER): Payer: 59 | Admitting: Pharmacist

## 2011-04-21 DIAGNOSIS — I4891 Unspecified atrial fibrillation: Secondary | ICD-10-CM

## 2011-04-21 DIAGNOSIS — Z7901 Long term (current) use of anticoagulants: Secondary | ICD-10-CM

## 2011-04-21 DIAGNOSIS — I2699 Other pulmonary embolism without acute cor pulmonale: Secondary | ICD-10-CM

## 2011-05-19 ENCOUNTER — Ambulatory Visit (INDEPENDENT_AMBULATORY_CARE_PROVIDER_SITE_OTHER): Payer: 59 | Admitting: *Deleted

## 2011-05-19 DIAGNOSIS — I4891 Unspecified atrial fibrillation: Secondary | ICD-10-CM

## 2011-05-19 DIAGNOSIS — Z7901 Long term (current) use of anticoagulants: Secondary | ICD-10-CM

## 2011-05-19 DIAGNOSIS — I2699 Other pulmonary embolism without acute cor pulmonale: Secondary | ICD-10-CM

## 2011-05-19 LAB — POCT INR: INR: 1.5

## 2011-06-02 ENCOUNTER — Ambulatory Visit (INDEPENDENT_AMBULATORY_CARE_PROVIDER_SITE_OTHER): Payer: 59 | Admitting: *Deleted

## 2011-06-02 DIAGNOSIS — I4891 Unspecified atrial fibrillation: Secondary | ICD-10-CM

## 2011-06-02 DIAGNOSIS — Z7901 Long term (current) use of anticoagulants: Secondary | ICD-10-CM

## 2011-06-02 DIAGNOSIS — I2699 Other pulmonary embolism without acute cor pulmonale: Secondary | ICD-10-CM

## 2011-06-02 LAB — POCT INR: INR: 1.8

## 2011-06-04 ENCOUNTER — Other Ambulatory Visit: Payer: Self-pay | Admitting: Cardiology

## 2011-06-15 ENCOUNTER — Telehealth: Payer: Self-pay | Admitting: Cardiology

## 2011-06-15 NOTE — Telephone Encounter (Signed)
New Problem:    Patient called in needing a refill for her metoprolol tartrate (LOPRESSOR) 25 MG tablet filled at the pharmacy listed on her profile.

## 2011-06-16 MED ORDER — METOPROLOL TARTRATE 25 MG PO TABS: ORAL_TABLET | ORAL | Status: DC

## 2011-06-16 NOTE — Telephone Encounter (Signed)
Refilled medication

## 2011-06-17 ENCOUNTER — Ambulatory Visit (INDEPENDENT_AMBULATORY_CARE_PROVIDER_SITE_OTHER): Payer: 59 | Admitting: *Deleted

## 2011-06-17 DIAGNOSIS — I2699 Other pulmonary embolism without acute cor pulmonale: Secondary | ICD-10-CM

## 2011-06-17 DIAGNOSIS — I4891 Unspecified atrial fibrillation: Secondary | ICD-10-CM

## 2011-06-17 DIAGNOSIS — Z7901 Long term (current) use of anticoagulants: Secondary | ICD-10-CM

## 2011-07-01 ENCOUNTER — Ambulatory Visit (INDEPENDENT_AMBULATORY_CARE_PROVIDER_SITE_OTHER): Payer: 59

## 2011-07-01 DIAGNOSIS — I4891 Unspecified atrial fibrillation: Secondary | ICD-10-CM

## 2011-07-01 DIAGNOSIS — I2699 Other pulmonary embolism without acute cor pulmonale: Secondary | ICD-10-CM

## 2011-07-01 DIAGNOSIS — Z7901 Long term (current) use of anticoagulants: Secondary | ICD-10-CM

## 2011-07-01 LAB — POCT INR: INR: 2.3

## 2011-07-06 ENCOUNTER — Telehealth: Payer: Self-pay | Admitting: Cardiology

## 2011-07-06 NOTE — Telephone Encounter (Signed)
New Problem:     Patient called in needing a refill of her metoprolol tartrate (LOPRESSOR) 25 MG tablet.  Dr. Antoine Poche has her taking 1 1/2 tablet a day and her 30 pill prescription has run out early. Please call back if you have any questions.

## 2011-07-07 ENCOUNTER — Telehealth: Payer: Self-pay

## 2011-07-07 NOTE — Telephone Encounter (Signed)
Patient needs to call the office

## 2011-07-22 ENCOUNTER — Ambulatory Visit (INDEPENDENT_AMBULATORY_CARE_PROVIDER_SITE_OTHER): Payer: 59 | Admitting: *Deleted

## 2011-07-22 DIAGNOSIS — Z7901 Long term (current) use of anticoagulants: Secondary | ICD-10-CM

## 2011-07-22 DIAGNOSIS — I4891 Unspecified atrial fibrillation: Secondary | ICD-10-CM

## 2011-07-22 DIAGNOSIS — I2699 Other pulmonary embolism without acute cor pulmonale: Secondary | ICD-10-CM

## 2011-07-22 LAB — POCT INR: INR: 1.9

## 2011-08-12 ENCOUNTER — Ambulatory Visit (INDEPENDENT_AMBULATORY_CARE_PROVIDER_SITE_OTHER): Payer: 59

## 2011-08-12 DIAGNOSIS — I2699 Other pulmonary embolism without acute cor pulmonale: Secondary | ICD-10-CM

## 2011-08-12 DIAGNOSIS — I4891 Unspecified atrial fibrillation: Secondary | ICD-10-CM

## 2011-08-12 DIAGNOSIS — Z7901 Long term (current) use of anticoagulants: Secondary | ICD-10-CM

## 2011-08-12 LAB — POCT INR: INR: 2.1

## 2011-09-09 ENCOUNTER — Ambulatory Visit (INDEPENDENT_AMBULATORY_CARE_PROVIDER_SITE_OTHER): Payer: 59 | Admitting: *Deleted

## 2011-09-09 DIAGNOSIS — Z7901 Long term (current) use of anticoagulants: Secondary | ICD-10-CM

## 2011-09-09 DIAGNOSIS — I2699 Other pulmonary embolism without acute cor pulmonale: Secondary | ICD-10-CM

## 2011-09-09 DIAGNOSIS — I4891 Unspecified atrial fibrillation: Secondary | ICD-10-CM

## 2011-09-09 LAB — POCT INR: INR: 1.8

## 2011-09-12 ENCOUNTER — Other Ambulatory Visit: Payer: Self-pay | Admitting: Physician Assistant

## 2011-10-07 ENCOUNTER — Ambulatory Visit (INDEPENDENT_AMBULATORY_CARE_PROVIDER_SITE_OTHER): Payer: 59

## 2011-10-07 DIAGNOSIS — I4891 Unspecified atrial fibrillation: Secondary | ICD-10-CM

## 2011-10-07 DIAGNOSIS — Z7901 Long term (current) use of anticoagulants: Secondary | ICD-10-CM

## 2011-10-07 DIAGNOSIS — I2699 Other pulmonary embolism without acute cor pulmonale: Secondary | ICD-10-CM

## 2011-10-07 LAB — POCT INR: INR: 1.9

## 2011-10-14 ENCOUNTER — Other Ambulatory Visit: Payer: Self-pay | Admitting: Cardiology

## 2011-11-04 ENCOUNTER — Ambulatory Visit (INDEPENDENT_AMBULATORY_CARE_PROVIDER_SITE_OTHER): Payer: Self-pay

## 2011-11-04 DIAGNOSIS — I4891 Unspecified atrial fibrillation: Secondary | ICD-10-CM

## 2011-11-04 DIAGNOSIS — Z7901 Long term (current) use of anticoagulants: Secondary | ICD-10-CM

## 2011-11-04 DIAGNOSIS — I2699 Other pulmonary embolism without acute cor pulmonale: Secondary | ICD-10-CM

## 2011-11-04 LAB — POCT INR: INR: 2.3

## 2011-11-13 ENCOUNTER — Other Ambulatory Visit: Payer: Self-pay | Admitting: Physician Assistant

## 2011-12-08 ENCOUNTER — Encounter: Payer: Self-pay | Admitting: *Deleted

## 2011-12-20 ENCOUNTER — Other Ambulatory Visit: Payer: Self-pay | Admitting: Physician Assistant

## 2011-12-23 ENCOUNTER — Other Ambulatory Visit: Payer: Self-pay | Admitting: *Deleted

## 2011-12-23 MED ORDER — METOPROLOL TARTRATE 25 MG PO TABS: ORAL_TABLET | ORAL | Status: DC

## 2011-12-26 ENCOUNTER — Ambulatory Visit (INDEPENDENT_AMBULATORY_CARE_PROVIDER_SITE_OTHER): Payer: Self-pay | Admitting: *Deleted

## 2011-12-26 DIAGNOSIS — Z7901 Long term (current) use of anticoagulants: Secondary | ICD-10-CM

## 2011-12-26 DIAGNOSIS — I2699 Other pulmonary embolism without acute cor pulmonale: Secondary | ICD-10-CM

## 2011-12-26 DIAGNOSIS — I4891 Unspecified atrial fibrillation: Secondary | ICD-10-CM

## 2012-01-16 ENCOUNTER — Ambulatory Visit (INDEPENDENT_AMBULATORY_CARE_PROVIDER_SITE_OTHER): Payer: Self-pay

## 2012-01-16 DIAGNOSIS — I2699 Other pulmonary embolism without acute cor pulmonale: Secondary | ICD-10-CM

## 2012-01-16 DIAGNOSIS — I4891 Unspecified atrial fibrillation: Secondary | ICD-10-CM

## 2012-01-16 DIAGNOSIS — Z7901 Long term (current) use of anticoagulants: Secondary | ICD-10-CM

## 2012-01-18 ENCOUNTER — Other Ambulatory Visit: Payer: Self-pay | Admitting: Physician Assistant

## 2012-02-13 ENCOUNTER — Ambulatory Visit (INDEPENDENT_AMBULATORY_CARE_PROVIDER_SITE_OTHER): Payer: Self-pay

## 2012-02-13 DIAGNOSIS — I2699 Other pulmonary embolism without acute cor pulmonale: Secondary | ICD-10-CM

## 2012-02-13 DIAGNOSIS — Z7901 Long term (current) use of anticoagulants: Secondary | ICD-10-CM

## 2012-02-13 DIAGNOSIS — I4891 Unspecified atrial fibrillation: Secondary | ICD-10-CM

## 2012-02-25 ENCOUNTER — Other Ambulatory Visit: Payer: Self-pay | Admitting: Cardiology

## 2012-02-27 ENCOUNTER — Other Ambulatory Visit: Payer: Self-pay | Admitting: *Deleted

## 2012-02-28 ENCOUNTER — Other Ambulatory Visit: Payer: Self-pay | Admitting: Cardiology

## 2012-03-12 ENCOUNTER — Ambulatory Visit (INDEPENDENT_AMBULATORY_CARE_PROVIDER_SITE_OTHER): Payer: Self-pay | Admitting: *Deleted

## 2012-03-12 DIAGNOSIS — I2699 Other pulmonary embolism without acute cor pulmonale: Secondary | ICD-10-CM

## 2012-03-12 DIAGNOSIS — Z7901 Long term (current) use of anticoagulants: Secondary | ICD-10-CM

## 2012-03-12 DIAGNOSIS — I4891 Unspecified atrial fibrillation: Secondary | ICD-10-CM

## 2012-04-23 ENCOUNTER — Ambulatory Visit (INDEPENDENT_AMBULATORY_CARE_PROVIDER_SITE_OTHER): Payer: Self-pay

## 2012-04-23 DIAGNOSIS — I2699 Other pulmonary embolism without acute cor pulmonale: Secondary | ICD-10-CM

## 2012-04-23 DIAGNOSIS — Z7901 Long term (current) use of anticoagulants: Secondary | ICD-10-CM

## 2012-04-23 DIAGNOSIS — I4891 Unspecified atrial fibrillation: Secondary | ICD-10-CM

## 2012-04-23 LAB — POCT INR: INR: 3.3

## 2012-05-01 ENCOUNTER — Other Ambulatory Visit: Payer: Self-pay | Admitting: Cardiology

## 2012-05-02 NOTE — Telephone Encounter (Signed)
..   Requested Prescriptions   Pending Prescriptions Disp Refills  . furosemide (LASIX) 40 MG tablet [Pharmacy Med Name: FUROSEMIDE 40MG      TAB] 30 tablet 0    Sig: TAKE ONE TABLET BY MOUTH EVERY DAY  .Marland KitchenPatient needs to contact office to schedule  Appointment  for future refills.Ph:[phone]. Thank you.

## 2012-05-12 ENCOUNTER — Other Ambulatory Visit: Payer: Self-pay | Admitting: Cardiology

## 2012-05-18 ENCOUNTER — Ambulatory Visit (INDEPENDENT_AMBULATORY_CARE_PROVIDER_SITE_OTHER): Payer: Self-pay

## 2012-05-18 DIAGNOSIS — I2699 Other pulmonary embolism without acute cor pulmonale: Secondary | ICD-10-CM

## 2012-05-18 DIAGNOSIS — Z7901 Long term (current) use of anticoagulants: Secondary | ICD-10-CM

## 2012-05-18 DIAGNOSIS — I4891 Unspecified atrial fibrillation: Secondary | ICD-10-CM

## 2012-05-18 LAB — POCT INR: INR: 2.3

## 2012-05-29 ENCOUNTER — Other Ambulatory Visit: Payer: Self-pay | Admitting: Cardiology

## 2012-06-05 ENCOUNTER — Other Ambulatory Visit: Payer: Self-pay | Admitting: Cardiology

## 2012-06-15 ENCOUNTER — Ambulatory Visit (INDEPENDENT_AMBULATORY_CARE_PROVIDER_SITE_OTHER): Payer: Self-pay | Admitting: *Deleted

## 2012-06-15 DIAGNOSIS — I4891 Unspecified atrial fibrillation: Secondary | ICD-10-CM

## 2012-06-15 DIAGNOSIS — I2699 Other pulmonary embolism without acute cor pulmonale: Secondary | ICD-10-CM

## 2012-06-15 DIAGNOSIS — Z7901 Long term (current) use of anticoagulants: Secondary | ICD-10-CM

## 2012-07-11 ENCOUNTER — Other Ambulatory Visit: Payer: Self-pay | Admitting: Cardiology

## 2012-07-17 ENCOUNTER — Other Ambulatory Visit: Payer: Self-pay | Admitting: Cardiology

## 2012-07-20 ENCOUNTER — Ambulatory Visit (INDEPENDENT_AMBULATORY_CARE_PROVIDER_SITE_OTHER): Payer: Self-pay | Admitting: *Deleted

## 2012-07-20 DIAGNOSIS — I4891 Unspecified atrial fibrillation: Secondary | ICD-10-CM

## 2012-07-20 DIAGNOSIS — I2699 Other pulmonary embolism without acute cor pulmonale: Secondary | ICD-10-CM

## 2012-07-20 DIAGNOSIS — Z7901 Long term (current) use of anticoagulants: Secondary | ICD-10-CM

## 2012-07-20 LAB — POCT INR: INR: 3.5

## 2012-07-24 ENCOUNTER — Other Ambulatory Visit: Payer: Self-pay | Admitting: Cardiology

## 2012-07-25 NOTE — Telephone Encounter (Signed)
..   Requested Prescriptions   Pending Prescriptions Disp Refills  . furosemide (LASIX) 40 MG tablet [Pharmacy Med Name: FUROSEMIDE 40MG      TAB] 30 tablet 0    Sig: TAKE ONE TABLET BY MOUTH ONCE DAILY. NEED OFFICE VISIT FOR ADDITIONAL REFILLS.

## 2012-08-06 ENCOUNTER — Ambulatory Visit (INDEPENDENT_AMBULATORY_CARE_PROVIDER_SITE_OTHER): Payer: Self-pay

## 2012-08-06 DIAGNOSIS — I4891 Unspecified atrial fibrillation: Secondary | ICD-10-CM

## 2012-08-06 DIAGNOSIS — Z7901 Long term (current) use of anticoagulants: Secondary | ICD-10-CM

## 2012-08-06 DIAGNOSIS — I2699 Other pulmonary embolism without acute cor pulmonale: Secondary | ICD-10-CM

## 2012-08-06 LAB — POCT INR: INR: 2.3

## 2012-09-03 ENCOUNTER — Ambulatory Visit (INDEPENDENT_AMBULATORY_CARE_PROVIDER_SITE_OTHER): Payer: Self-pay | Admitting: *Deleted

## 2012-09-03 DIAGNOSIS — I2699 Other pulmonary embolism without acute cor pulmonale: Secondary | ICD-10-CM

## 2012-09-03 DIAGNOSIS — Z7901 Long term (current) use of anticoagulants: Secondary | ICD-10-CM

## 2012-09-03 DIAGNOSIS — I4891 Unspecified atrial fibrillation: Secondary | ICD-10-CM

## 2012-09-03 LAB — POCT INR: INR: 3.3

## 2012-09-10 ENCOUNTER — Other Ambulatory Visit: Payer: Self-pay | Admitting: *Deleted

## 2012-09-10 MED ORDER — FUROSEMIDE 40 MG PO TABS: ORAL_TABLET | ORAL | Status: DC

## 2012-09-10 NOTE — Telephone Encounter (Signed)
Pt called wanting to know if she could have some refills till her appt just gave her enough to last until then

## 2012-09-19 ENCOUNTER — Ambulatory Visit (INDEPENDENT_AMBULATORY_CARE_PROVIDER_SITE_OTHER): Payer: Self-pay | Admitting: *Deleted

## 2012-09-19 DIAGNOSIS — I4891 Unspecified atrial fibrillation: Secondary | ICD-10-CM

## 2012-09-19 DIAGNOSIS — Z7901 Long term (current) use of anticoagulants: Secondary | ICD-10-CM

## 2012-09-19 DIAGNOSIS — I2699 Other pulmonary embolism without acute cor pulmonale: Secondary | ICD-10-CM

## 2012-09-24 ENCOUNTER — Encounter: Payer: Self-pay | Admitting: Nurse Practitioner

## 2012-09-24 ENCOUNTER — Ambulatory Visit (INDEPENDENT_AMBULATORY_CARE_PROVIDER_SITE_OTHER): Payer: Self-pay | Admitting: Nurse Practitioner

## 2012-09-24 VITALS — BP 150/90 | HR 88 | Ht 66.0 in | Wt 283.8 lb

## 2012-09-24 DIAGNOSIS — I4891 Unspecified atrial fibrillation: Secondary | ICD-10-CM

## 2012-09-24 MED ORDER — FUROSEMIDE 40 MG PO TABS: 40.0000 mg | ORAL_TABLET | Freq: Every day | ORAL | Status: DC

## 2012-09-24 NOTE — Patient Instructions (Addendum)
Stay on your current medicines. I have refilled the Lasix today  Try to cut back on the salt and the sugars!  See Dr. Antoine Poche in 4 months  Call the [facility] office at [phone] if you have any questions, problems or concerns.

## 2012-09-24 NOTE — Progress Notes (Signed)
[name] Date of Birth: [birth year] Medical Record #[id number]  History of Present Illness: [name] is seen back today for a follow up visit. Seen for Dr. Antoine [name]. Has not been here since August of 2012. Has had prior right MCA stroke treated with TPA and thrombectomy with hemorrhagic transformation in March of 2012. Other issues include obesity, pulmonary embolism with  IVC filter, atrial fib, chronic coumadin, positive lupus anticoagulant, depression, HTN, prior heparin induced thrombocytopenia and diastolic heart failure. Last echo in March of 2012 showed a normal EF with mild LVH, mild AI, mild MR, moderate to severe LAE, moderate RAE, PASP 44.   Last seen in August of 2012 and was felt to be doing well.   Comes back today. She is here alone. She has gained over 30 pounds in 2 years. Admits that she likes to eat. Drinks Pepsi. Eats salt. BP is better at home. Recheck by me was better. No chest pain. Not short of breath. Has chronic swelling, especially on the left. Using a cane. Does ride a stationary bike. Needs her Lasix refilled. Dr. Kevan [name] checks her labs.   Current Outpatient Prescriptions  Medication Sig Dispense Refill  . aspirin 81 MG tablet Take 81 mg by mouth daily.        . citalopram (CELEXA) 10 MG tablet Take 10 mg by mouth daily.        Marland Kitchen diltiazem (CARDIZEM CD) 300 MG 24 hr capsule Take 1 capsule (300 mg total) by mouth daily.  30 capsule  11  . docusate sodium (COLACE) 100 MG capsule Take 100 mg by mouth daily.        Marland Kitchen FLUoxetine (PROZAC) 10 MG capsule Take 10 mg by mouth daily.       Marland Kitchen KLOR-CON M10 10 MEQ tablet TAKE ONE TABLET BY MOUTH ONCE DAILY  30 tablet  6  . metoprolol tartrate (LOPRESSOR) 25 MG tablet Take 1/2 tablet twice a day  30 tablet  9  . warfarin (COUMADIN) 3 MG tablet Take as directed by anticoagulation clinic  60 tablet  2  . furosemide (LASIX) 40 MG tablet TAKE ONE TABLET BY MOUTH ONCE DAILY. NEED OFFICE VISIT FOR ADDITIONAL REFILLS.  30  tablet  0  . potassium chloride (K-DUR) 10 MEQ tablet Take 1 tablet (10 mEq total) by mouth daily.  30 tablet  11   No current facility-administered medications for this visit.    Allergies  Allergen Reactions  . Heparin     Past Medical History  Diagnosis Date  . Obesity, unspecified   . PULMONARY EMBOLISM     History of IVC filter   . Atrial fibrillation     On chronic Coumadin  . ACUT VENUS EMBO&THROMB DEEP VES PROX LOWR EXTREM     Positive lupus anticoagulant  . Depression   . Diastolic heart failure     Echo 3/12: EF 55-60%; mild LVH, mild AI, mild MR, moderate to severe LAE, moderate RAE, PASP 44  . Atrial thrombosis   . Acute right MCA stroke 06/04/10    Treated with TPA and thrombectomy with hemorrhagic transformation; Carotids 3/12 negative for ICA stenosis  . HTN (hypertension)   . HIT (heparin-induced thrombocytopenia)     Past Surgical History  Procedure Laterality Date  . Ivc placement 08/30/2005    . Open reduction and internal fixation of left intra-articular    . Distal radius fracture. (12,/16/2010)      History  Smoking status  . Never Smoker  Smokeless tobacco  . Not on file    History  Alcohol Use No    Family History  Problem Relation Age of Onset  . Coronary artery disease    . Diabetes      Review of Systems: The review of systems is per the HPI.  All other systems were reviewed and are negative.  Physical Exam: BP 150/90  Pulse 88  Ht 5\' 6"  (1.676 m)  Wt 283 lb 12.8 oz (128.731 kg)  BMI 45.83 kg/m2 BP palpated at 130 by me radially. She is quite obese with very large arms. Patient is very pleasant and in no acute distress. Skin is warm and dry. Color is normal.  HEENT is unremarkable. Normocephalic/atraumatic. PERRL. Sclera are nonicteric. Neck is supple. No masses. No JVD. Lungs are clear. Cardiac exam shows an irregular rhythm. Rate is ok.  Abdomen is soft. Extremities are with chronic edema, left > right. Gait and ROM are  intact. No gross neurologic deficits noted.  LABORATORY DATA:   Chemistry      Component Value Date/Time   NA 137 07/01/2010 0800   K 4.7 HEMOLYZED SPECIMEN, RESULTS MAY BE AFFECTED 07/01/2010 0800   CL 105 07/01/2010 0800   CO2 24 07/01/2010 0800   BUN 7 07/01/2010 0800   CREATININE 0.97 07/01/2010 0800      Component Value Date/Time   CALCIUM 8.7 07/01/2010 0800   ALKPHOS 81 06/15/2010 1050   AST 23 06/15/2010 1050   ALT 20 06/15/2010 1050   BILITOT 1.8* 06/15/2010 1050     Lab Results  Component Value Date   CHOL  Value: 139        ATP III CLASSIFICATION:  <200     mg/dL   Desirable  161-096  mg/dL   Borderline High  >=045    mg/dL   High        4/0/9811   HDL 33* 06/04/2010   LDLCALC  Value: 92        Total Cholesterol/HDL:CHD Risk Coronary Heart Disease Risk Table                     Men   Women  1/2 Average Risk   3.4   3.3  Average Risk       5.0   4.4  2 X Average Risk   9.6   7.1  3 X Average Risk  23.4   11.0        Use the calculated Patient Ratio above and the CHD Risk Table to determine the patient's CHD Risk.        ATP III CLASSIFICATION (LDL):  <100     mg/dL   Optimal  914-782  mg/dL   Near or Above                    Optimal  130-159  mg/dL   Borderline  956-213  mg/dL   High  >086     mg/dL   Very High 08/02/8467   TRIG 71 06/04/2010   CHOLHDL 4.2 06/04/2010   Lab Results  Component Value Date   WBC 8.6 WHITE COUNT CONFIRMED ON SMEAR 07/01/2010   HGB 11.9* 07/01/2010   HCT 35.7* 07/01/2010   MCV 96.7 07/01/2010   PLT 349 PLATELET COUNT CONFIRMED BY SMEAR 07/01/2010    Assessment / Plan: 1. Chronic atrial fib - managed with rate control and coumadin.   2. Chronic coumadin - no problems noted.  3.  Old stroke  4. Chronic swelling - I have refilled her Lasix today.   5. HTN - admits to using too much salt. BP by me was better. No change for now in her medicines.   6. Obesity - this is the crux of her issue. She does understand the need to work on her weight but I am not sure she has the  motivation to make the changes needed.   Dr. Antoine [name] will see her back in 4 months.   Patient is agreeable to this plan and will call if any problems develop in the interim.   Melody Macadamia, RN, ANP-C [facility] [address] [place], [place]  [address]

## 2012-10-15 ENCOUNTER — Ambulatory Visit (INDEPENDENT_AMBULATORY_CARE_PROVIDER_SITE_OTHER): Payer: Self-pay | Admitting: *Deleted

## 2012-10-15 DIAGNOSIS — I2699 Other pulmonary embolism without acute cor pulmonale: Secondary | ICD-10-CM

## 2012-10-15 DIAGNOSIS — Z7901 Long term (current) use of anticoagulants: Secondary | ICD-10-CM

## 2012-10-15 DIAGNOSIS — I4891 Unspecified atrial fibrillation: Secondary | ICD-10-CM

## 2012-11-05 ENCOUNTER — Ambulatory Visit (INDEPENDENT_AMBULATORY_CARE_PROVIDER_SITE_OTHER): Payer: Self-pay | Admitting: *Deleted

## 2012-11-05 DIAGNOSIS — I2699 Other pulmonary embolism without acute cor pulmonale: Secondary | ICD-10-CM

## 2012-11-05 DIAGNOSIS — Z7901 Long term (current) use of anticoagulants: Secondary | ICD-10-CM

## 2012-11-05 DIAGNOSIS — I4891 Unspecified atrial fibrillation: Secondary | ICD-10-CM

## 2012-11-13 ENCOUNTER — Other Ambulatory Visit: Payer: Self-pay | Admitting: Cardiology

## 2012-11-19 ENCOUNTER — Other Ambulatory Visit: Payer: Self-pay | Admitting: Cardiology

## 2012-12-03 ENCOUNTER — Ambulatory Visit (INDEPENDENT_AMBULATORY_CARE_PROVIDER_SITE_OTHER): Payer: Self-pay | Admitting: *Deleted

## 2012-12-03 DIAGNOSIS — Z7901 Long term (current) use of anticoagulants: Secondary | ICD-10-CM

## 2012-12-03 DIAGNOSIS — I2699 Other pulmonary embolism without acute cor pulmonale: Secondary | ICD-10-CM

## 2012-12-03 DIAGNOSIS — I4891 Unspecified atrial fibrillation: Secondary | ICD-10-CM

## 2012-12-24 ENCOUNTER — Ambulatory Visit (INDEPENDENT_AMBULATORY_CARE_PROVIDER_SITE_OTHER): Payer: Self-pay

## 2012-12-24 DIAGNOSIS — I2699 Other pulmonary embolism without acute cor pulmonale: Secondary | ICD-10-CM

## 2012-12-24 DIAGNOSIS — I4891 Unspecified atrial fibrillation: Secondary | ICD-10-CM

## 2012-12-24 DIAGNOSIS — Z7901 Long term (current) use of anticoagulants: Secondary | ICD-10-CM

## 2013-03-04 ENCOUNTER — Other Ambulatory Visit: Payer: Self-pay | Admitting: Cardiology

## 2013-06-11 ENCOUNTER — Ambulatory Visit (INDEPENDENT_AMBULATORY_CARE_PROVIDER_SITE_OTHER): Payer: Medicare Other | Admitting: *Deleted

## 2013-06-11 DIAGNOSIS — Z7901 Long term (current) use of anticoagulants: Secondary | ICD-10-CM

## 2013-06-11 DIAGNOSIS — Z5181 Encounter for therapeutic drug level monitoring: Secondary | ICD-10-CM | POA: Insufficient documentation

## 2013-06-11 DIAGNOSIS — I4891 Unspecified atrial fibrillation: Secondary | ICD-10-CM | POA: Diagnosis not present

## 2013-06-11 DIAGNOSIS — I2699 Other pulmonary embolism without acute cor pulmonale: Secondary | ICD-10-CM

## 2013-06-11 LAB — POCT INR: INR: 1.9

## 2013-06-28 ENCOUNTER — Ambulatory Visit (INDEPENDENT_AMBULATORY_CARE_PROVIDER_SITE_OTHER): Payer: Medicare Other | Admitting: *Deleted

## 2013-06-28 DIAGNOSIS — I4891 Unspecified atrial fibrillation: Secondary | ICD-10-CM

## 2013-06-28 DIAGNOSIS — Z7901 Long term (current) use of anticoagulants: Secondary | ICD-10-CM | POA: Diagnosis not present

## 2013-06-28 DIAGNOSIS — I2699 Other pulmonary embolism without acute cor pulmonale: Secondary | ICD-10-CM | POA: Diagnosis not present

## 2013-06-28 DIAGNOSIS — Z5181 Encounter for therapeutic drug level monitoring: Secondary | ICD-10-CM | POA: Diagnosis not present

## 2013-06-28 LAB — POCT INR: INR: 2.8

## 2013-07-22 ENCOUNTER — Ambulatory Visit (INDEPENDENT_AMBULATORY_CARE_PROVIDER_SITE_OTHER): Payer: Medicare Other

## 2013-07-22 DIAGNOSIS — Z5181 Encounter for therapeutic drug level monitoring: Secondary | ICD-10-CM | POA: Diagnosis not present

## 2013-07-22 DIAGNOSIS — I2699 Other pulmonary embolism without acute cor pulmonale: Secondary | ICD-10-CM

## 2013-07-22 DIAGNOSIS — Z7901 Long term (current) use of anticoagulants: Secondary | ICD-10-CM | POA: Diagnosis not present

## 2013-07-22 DIAGNOSIS — I4891 Unspecified atrial fibrillation: Secondary | ICD-10-CM

## 2013-07-22 LAB — POCT INR: INR: 2

## 2013-08-23 ENCOUNTER — Other Ambulatory Visit: Payer: Self-pay | Admitting: *Deleted

## 2013-08-23 MED ORDER — WARFARIN SODIUM 3 MG PO TABS: ORAL_TABLET | ORAL | Status: DC

## 2013-08-27 ENCOUNTER — Ambulatory Visit (INDEPENDENT_AMBULATORY_CARE_PROVIDER_SITE_OTHER): Payer: Medicare Other | Admitting: *Deleted

## 2013-08-27 DIAGNOSIS — I4891 Unspecified atrial fibrillation: Secondary | ICD-10-CM

## 2013-08-27 DIAGNOSIS — Z5181 Encounter for therapeutic drug level monitoring: Secondary | ICD-10-CM | POA: Diagnosis not present

## 2013-08-27 DIAGNOSIS — I2699 Other pulmonary embolism without acute cor pulmonale: Secondary | ICD-10-CM

## 2013-08-27 DIAGNOSIS — Z7901 Long term (current) use of anticoagulants: Secondary | ICD-10-CM

## 2013-08-27 LAB — POCT INR: INR: 1.6

## 2013-09-11 ENCOUNTER — Ambulatory Visit (INDEPENDENT_AMBULATORY_CARE_PROVIDER_SITE_OTHER): Payer: Medicare Other | Admitting: *Deleted

## 2013-09-11 DIAGNOSIS — I2699 Other pulmonary embolism without acute cor pulmonale: Secondary | ICD-10-CM | POA: Diagnosis not present

## 2013-09-11 DIAGNOSIS — Z7901 Long term (current) use of anticoagulants: Secondary | ICD-10-CM

## 2013-09-11 DIAGNOSIS — I4891 Unspecified atrial fibrillation: Secondary | ICD-10-CM | POA: Diagnosis not present

## 2013-09-11 DIAGNOSIS — Z5181 Encounter for therapeutic drug level monitoring: Secondary | ICD-10-CM

## 2013-09-11 LAB — POCT INR: INR: 2.6

## 2013-09-24 ENCOUNTER — Other Ambulatory Visit: Payer: Self-pay | Admitting: Cardiology

## 2013-10-09 ENCOUNTER — Ambulatory Visit (INDEPENDENT_AMBULATORY_CARE_PROVIDER_SITE_OTHER): Payer: Medicare Other | Admitting: *Deleted

## 2013-10-09 DIAGNOSIS — I2699 Other pulmonary embolism without acute cor pulmonale: Secondary | ICD-10-CM | POA: Diagnosis not present

## 2013-10-09 DIAGNOSIS — Z7901 Long term (current) use of anticoagulants: Secondary | ICD-10-CM | POA: Diagnosis not present

## 2013-10-09 DIAGNOSIS — Z5181 Encounter for therapeutic drug level monitoring: Secondary | ICD-10-CM | POA: Diagnosis not present

## 2013-10-09 DIAGNOSIS — I4891 Unspecified atrial fibrillation: Secondary | ICD-10-CM

## 2013-10-09 LAB — POCT INR: INR: 2.1

## 2013-11-02 ENCOUNTER — Other Ambulatory Visit: Payer: Self-pay | Admitting: Cardiology

## 2013-11-11 ENCOUNTER — Ambulatory Visit (INDEPENDENT_AMBULATORY_CARE_PROVIDER_SITE_OTHER): Payer: Medicare Other

## 2013-11-11 DIAGNOSIS — I2699 Other pulmonary embolism without acute cor pulmonale: Secondary | ICD-10-CM | POA: Diagnosis not present

## 2013-11-11 DIAGNOSIS — Z5181 Encounter for therapeutic drug level monitoring: Secondary | ICD-10-CM

## 2013-11-11 DIAGNOSIS — I4891 Unspecified atrial fibrillation: Secondary | ICD-10-CM | POA: Diagnosis not present

## 2013-11-11 DIAGNOSIS — Z7901 Long term (current) use of anticoagulants: Secondary | ICD-10-CM

## 2013-11-11 LAB — POCT INR: INR: 3

## 2013-12-12 ENCOUNTER — Other Ambulatory Visit: Payer: Self-pay | Admitting: Cardiology

## 2013-12-23 ENCOUNTER — Ambulatory Visit (INDEPENDENT_AMBULATORY_CARE_PROVIDER_SITE_OTHER): Payer: Medicare Other | Admitting: *Deleted

## 2013-12-23 DIAGNOSIS — I2699 Other pulmonary embolism without acute cor pulmonale: Secondary | ICD-10-CM | POA: Diagnosis not present

## 2013-12-23 DIAGNOSIS — I4891 Unspecified atrial fibrillation: Secondary | ICD-10-CM | POA: Diagnosis not present

## 2013-12-23 DIAGNOSIS — Z5181 Encounter for therapeutic drug level monitoring: Secondary | ICD-10-CM | POA: Diagnosis not present

## 2013-12-23 DIAGNOSIS — Z7901 Long term (current) use of anticoagulants: Secondary | ICD-10-CM | POA: Diagnosis not present

## 2013-12-23 LAB — POCT INR: INR: 4.2

## 2013-12-24 ENCOUNTER — Telehealth: Payer: Self-pay | Admitting: Cardiology

## 2013-12-24 NOTE — Telephone Encounter (Signed)
New Message// refills  Refill of metoprolol.. Appt scheduled for 10/30 @ 3pm

## 2013-12-25 ENCOUNTER — Other Ambulatory Visit: Payer: Self-pay

## 2013-12-25 MED ORDER — METOPROLOL TARTRATE 25 MG PO TABS: ORAL_TABLET | ORAL | Status: DC

## 2014-01-06 ENCOUNTER — Ambulatory Visit (INDEPENDENT_AMBULATORY_CARE_PROVIDER_SITE_OTHER): Payer: Medicare Other | Admitting: *Deleted

## 2014-01-06 DIAGNOSIS — I2699 Other pulmonary embolism without acute cor pulmonale: Secondary | ICD-10-CM | POA: Diagnosis not present

## 2014-01-06 DIAGNOSIS — Z5181 Encounter for therapeutic drug level monitoring: Secondary | ICD-10-CM

## 2014-01-06 DIAGNOSIS — I4891 Unspecified atrial fibrillation: Secondary | ICD-10-CM | POA: Diagnosis not present

## 2014-01-06 DIAGNOSIS — Z7901 Long term (current) use of anticoagulants: Secondary | ICD-10-CM

## 2014-01-06 LAB — POCT INR: INR: 3.2

## 2014-01-24 ENCOUNTER — Ambulatory Visit: Payer: Medicare Other | Admitting: Cardiology

## 2014-01-24 ENCOUNTER — Other Ambulatory Visit: Payer: Self-pay | Admitting: *Deleted

## 2014-01-24 ENCOUNTER — Ambulatory Visit: Payer: Medicare Other | Admitting: Pharmacist Clinician (PhC)/ Clinical Pharmacy Specialist

## 2014-01-24 ENCOUNTER — Telehealth: Payer: Self-pay | Admitting: Cardiology

## 2014-01-24 MED ORDER — WARFARIN SODIUM 3 MG PO TABS: ORAL_TABLET | ORAL | Status: DC

## 2014-01-24 NOTE — Telephone Encounter (Signed)
New message         Pt needs a refill on warfarin

## 2014-02-12 ENCOUNTER — Other Ambulatory Visit: Payer: Self-pay | Admitting: *Deleted

## 2014-02-12 ENCOUNTER — Encounter: Payer: Self-pay | Admitting: Cardiology

## 2014-02-12 ENCOUNTER — Ambulatory Visit (INDEPENDENT_AMBULATORY_CARE_PROVIDER_SITE_OTHER): Payer: Medicare Other | Admitting: Pharmacist Clinician (PhC)/ Clinical Pharmacy Specialist

## 2014-02-12 ENCOUNTER — Ambulatory Visit (INDEPENDENT_AMBULATORY_CARE_PROVIDER_SITE_OTHER): Payer: Medicare Other | Admitting: Cardiology

## 2014-02-12 VITALS — BP 140/86 | HR 83 | Ht 66.0 in | Wt 270.0 lb

## 2014-02-12 DIAGNOSIS — I2699 Other pulmonary embolism without acute cor pulmonale: Secondary | ICD-10-CM | POA: Diagnosis not present

## 2014-02-12 DIAGNOSIS — I482 Chronic atrial fibrillation, unspecified: Secondary | ICD-10-CM

## 2014-02-12 DIAGNOSIS — Z7901 Long term (current) use of anticoagulants: Secondary | ICD-10-CM | POA: Diagnosis not present

## 2014-02-12 DIAGNOSIS — Z5181 Encounter for therapeutic drug level monitoring: Secondary | ICD-10-CM | POA: Diagnosis not present

## 2014-02-12 DIAGNOSIS — I4891 Unspecified atrial fibrillation: Secondary | ICD-10-CM

## 2014-02-12 LAB — POCT INR: INR: 3.3

## 2014-02-12 MED ORDER — DILTIAZEM HCL ER COATED BEADS 300 MG PO CP24: 300.0000 mg | ORAL_CAPSULE | Freq: Every day | ORAL | Status: DC

## 2014-02-12 NOTE — Progress Notes (Signed)
HPI The patient presents for follow up of atrial fib.  Since I last saw her she has done well.  The patient denies any new symptoms such as chest discomfort, neck or arm discomfort. There has been no new shortness of breath, PND or orthopnea. There have been no reported palpitations, presyncope or syncope.  She does not notice her fib.   She does have slight weakness in the left leg but overall recovered nicely from a previous stroke.   Allergies  Allergen Reactions  . Heparin     Current Outpatient Prescriptions  Medication Sig Dispense Refill  . aspirin 81 MG tablet Take 81 mg by mouth daily.      . citalopram (CELEXA) 10 MG tablet Take 10 mg by mouth daily.      Marland Kitchen diltiazem (CARDIZEM CD) 300 MG 24 hr capsule Take 1 capsule (300 mg total) by mouth daily. 30 capsule 11  . docusate sodium (COLACE) 100 MG capsule Take 100 mg by mouth daily.      Marland Kitchen FLUoxetine (PROZAC) 10 MG capsule Take 10 mg by mouth daily.     . furosemide (LASIX) 40 MG tablet Take 1 tablet (40 mg total) by mouth daily. TAKE ONE TABLET BY MOUTH ONCE DAILY. NEED OFFICE VISIT FOR ADDITIONAL REFILLS. 30 tablet 6  . KLOR-CON M10 10 MEQ tablet TAKE ONE TABLET BY MOUTH ONCE DAILY 30 tablet 6  . metoprolol tartrate (LOPRESSOR) 25 MG tablet Take one-half tablet by mouth twice a day 30 tablet 0  . warfarin (COUMADIN) 3 MG tablet Take as directed by coumadin clinic 60 tablet 0  . potassium chloride (K-DUR) 10 MEQ tablet Take 1 tablet (10 mEq total) by mouth daily. 30 tablet 11   No current facility-administered medications for this visit.    Past Medical History  Diagnosis Date  . Obesity, unspecified   . PULMONARY EMBOLISM     History of IVC filter   . Atrial fibrillation     On chronic Coumadin  . ACUT VENUS EMBO&THROMB DEEP VES PROX LOWR EXTREM     Positive lupus anticoagulant  . Depression   . Diastolic heart failure     Echo 3/12: EF 55-60%; mild LVH, mild AI, mild MR, moderate to severe LAE, moderate RAE, PASP 44  .  Atrial thrombosis   . Acute right MCA stroke 06/04/10    Treated with TPA and thrombectomy with hemorrhagic transformation; Carotids 3/12 negative for ICA stenosis  . HTN (hypertension)   . HIT (heparin-induced thrombocytopenia)     Past Surgical History  Procedure Laterality Date  . Ivc placement 08/30/2005    . Open reduction and internal fixation of left intra-articular    . Distal radius fracture. (12,/16/2010)      ROS:  As stated in the HPI and negative for all other systems.  PHYSICAL EXAM BP 140/86 mmHg  Pulse 83  Ht 5\' 6"  (1.676 m)  Wt 270 lb (122.471 kg)  BMI 43.60 kg/m2GENERAL:  Well appearing HEENT:  Pupils equal round and reactive, fundi not visualized, oral mucosa unremarkable, upper dentures NECK:  No jugular venous distention, waveform within normal limits, carotid upstroke brisk and symmetric, no bruits, no thyromegaly LUNGS:  Clear to auscultation bilaterally CHEST:  Unremarkable HEART:  PMI not displaced or sustained,S1 and S2 within normal limits, no S3, no S4, no clicks, no rubs, no murmurs ABD:  Flat, positive bowel sounds normal in frequency in pitch, no bruits, no rebound, no guarding, no midline pulsatile mass, no hepatomegaly,  no splenomegaly EXT:  2 plus pulses throughout, mild left greater than right edema, no cyanosis no clubbing SKIN:  No rashes no nodules NEURO:  Mild left leg weakness PSYCH:  Cognitively intact, oriented to person place and time  EKG:  Relation, rate 83, axis within normal limits, intervals within normal limits, no acute ST-T wave changes. Poor anterior R wave progression.  02/12/2014   ASSESSMENT AND PLAN  Chronic atrial fib -  The patient  tolerates this rhythm and rate control and anticoagulation. We will continue with the meds as listed.  Chronic swelling - No change in therapy is indicated.   HTN -   The blood pressure is at target. No change in medications is indicated. We will continue with therapeutic lifestyle changes  (TLC).

## 2014-02-12 NOTE — Patient Instructions (Signed)
Your physician recommends that you schedule a follow-up appointment in: 18 months with Dr. Hochrein  

## 2014-02-23 ENCOUNTER — Other Ambulatory Visit: Payer: Self-pay | Admitting: Cardiology

## 2014-02-27 ENCOUNTER — Ambulatory Visit (INDEPENDENT_AMBULATORY_CARE_PROVIDER_SITE_OTHER): Payer: Medicare Other | Admitting: *Deleted

## 2014-02-27 DIAGNOSIS — I2699 Other pulmonary embolism without acute cor pulmonale: Secondary | ICD-10-CM

## 2014-02-27 DIAGNOSIS — Z7901 Long term (current) use of anticoagulants: Secondary | ICD-10-CM

## 2014-02-27 DIAGNOSIS — I482 Chronic atrial fibrillation, unspecified: Secondary | ICD-10-CM

## 2014-02-27 DIAGNOSIS — Z5181 Encounter for therapeutic drug level monitoring: Secondary | ICD-10-CM

## 2014-02-27 DIAGNOSIS — I4891 Unspecified atrial fibrillation: Secondary | ICD-10-CM

## 2014-02-27 LAB — POCT INR: INR: 2.6

## 2014-03-07 ENCOUNTER — Telehealth: Payer: Self-pay | Admitting: *Deleted

## 2014-03-07 ENCOUNTER — Other Ambulatory Visit: Payer: Self-pay | Admitting: *Deleted

## 2014-03-07 MED ORDER — WARFARIN SODIUM 3 MG PO TABS: ORAL_TABLET | ORAL | Status: DC

## 2014-03-07 NOTE — Telephone Encounter (Signed)
Patient states that she was informed by the pharmacy that the sig needs to reflect how she is taking it instead of as directed. Thanks, MI

## 2014-03-17 ENCOUNTER — Ambulatory Visit (INDEPENDENT_AMBULATORY_CARE_PROVIDER_SITE_OTHER): Payer: Medicare Other | Admitting: *Deleted

## 2014-03-17 DIAGNOSIS — I4891 Unspecified atrial fibrillation: Secondary | ICD-10-CM

## 2014-03-17 DIAGNOSIS — Z7901 Long term (current) use of anticoagulants: Secondary | ICD-10-CM

## 2014-03-17 DIAGNOSIS — Z5181 Encounter for therapeutic drug level monitoring: Secondary | ICD-10-CM | POA: Diagnosis not present

## 2014-03-17 DIAGNOSIS — I2699 Other pulmonary embolism without acute cor pulmonale: Secondary | ICD-10-CM

## 2014-03-17 LAB — POCT INR: INR: 1.8

## 2014-03-18 NOTE — Telephone Encounter (Signed)
Refills request sent

## 2014-04-07 ENCOUNTER — Ambulatory Visit (INDEPENDENT_AMBULATORY_CARE_PROVIDER_SITE_OTHER): Payer: Medicare Other

## 2014-04-07 DIAGNOSIS — I4891 Unspecified atrial fibrillation: Secondary | ICD-10-CM | POA: Diagnosis not present

## 2014-04-07 DIAGNOSIS — I2699 Other pulmonary embolism without acute cor pulmonale: Secondary | ICD-10-CM

## 2014-04-07 DIAGNOSIS — Z5181 Encounter for therapeutic drug level monitoring: Secondary | ICD-10-CM | POA: Diagnosis not present

## 2014-04-07 DIAGNOSIS — Z7901 Long term (current) use of anticoagulants: Secondary | ICD-10-CM | POA: Diagnosis not present

## 2014-04-07 LAB — POCT INR: INR: 2.5

## 2014-05-05 ENCOUNTER — Ambulatory Visit (INDEPENDENT_AMBULATORY_CARE_PROVIDER_SITE_OTHER): Payer: Medicare Other | Admitting: *Deleted

## 2014-05-05 DIAGNOSIS — I4891 Unspecified atrial fibrillation: Secondary | ICD-10-CM

## 2014-05-05 DIAGNOSIS — Z5181 Encounter for therapeutic drug level monitoring: Secondary | ICD-10-CM

## 2014-05-05 DIAGNOSIS — I2699 Other pulmonary embolism without acute cor pulmonale: Secondary | ICD-10-CM | POA: Diagnosis not present

## 2014-05-05 DIAGNOSIS — Z7901 Long term (current) use of anticoagulants: Secondary | ICD-10-CM | POA: Diagnosis not present

## 2014-05-05 LAB — POCT INR: INR: 3

## 2014-06-02 ENCOUNTER — Ambulatory Visit (INDEPENDENT_AMBULATORY_CARE_PROVIDER_SITE_OTHER): Payer: Medicare Other

## 2014-06-02 DIAGNOSIS — I4891 Unspecified atrial fibrillation: Secondary | ICD-10-CM

## 2014-06-02 DIAGNOSIS — I2699 Other pulmonary embolism without acute cor pulmonale: Secondary | ICD-10-CM | POA: Diagnosis not present

## 2014-06-02 DIAGNOSIS — Z5181 Encounter for therapeutic drug level monitoring: Secondary | ICD-10-CM

## 2014-06-02 DIAGNOSIS — Z7901 Long term (current) use of anticoagulants: Secondary | ICD-10-CM | POA: Diagnosis not present

## 2014-06-02 LAB — POCT INR: INR: 1.9

## 2014-06-30 ENCOUNTER — Ambulatory Visit (INDEPENDENT_AMBULATORY_CARE_PROVIDER_SITE_OTHER): Payer: Medicare Other | Admitting: *Deleted

## 2014-06-30 DIAGNOSIS — Z5181 Encounter for therapeutic drug level monitoring: Secondary | ICD-10-CM | POA: Diagnosis not present

## 2014-06-30 DIAGNOSIS — I2699 Other pulmonary embolism without acute cor pulmonale: Secondary | ICD-10-CM

## 2014-06-30 DIAGNOSIS — Z7901 Long term (current) use of anticoagulants: Secondary | ICD-10-CM

## 2014-06-30 DIAGNOSIS — I4891 Unspecified atrial fibrillation: Secondary | ICD-10-CM

## 2014-06-30 LAB — POCT INR: INR: 1.5

## 2014-07-14 ENCOUNTER — Ambulatory Visit (INDEPENDENT_AMBULATORY_CARE_PROVIDER_SITE_OTHER): Payer: Medicare Other | Admitting: *Deleted

## 2014-07-14 DIAGNOSIS — I4891 Unspecified atrial fibrillation: Secondary | ICD-10-CM

## 2014-07-14 DIAGNOSIS — Z7901 Long term (current) use of anticoagulants: Secondary | ICD-10-CM | POA: Diagnosis not present

## 2014-07-14 DIAGNOSIS — Z5181 Encounter for therapeutic drug level monitoring: Secondary | ICD-10-CM

## 2014-07-14 DIAGNOSIS — I2699 Other pulmonary embolism without acute cor pulmonale: Secondary | ICD-10-CM

## 2014-07-14 LAB — POCT INR: INR: 2.9

## 2014-08-11 ENCOUNTER — Ambulatory Visit (INDEPENDENT_AMBULATORY_CARE_PROVIDER_SITE_OTHER): Payer: Medicare Other | Admitting: *Deleted

## 2014-08-11 DIAGNOSIS — I2699 Other pulmonary embolism without acute cor pulmonale: Secondary | ICD-10-CM | POA: Diagnosis not present

## 2014-08-11 DIAGNOSIS — I4891 Unspecified atrial fibrillation: Secondary | ICD-10-CM

## 2014-08-11 DIAGNOSIS — Z5181 Encounter for therapeutic drug level monitoring: Secondary | ICD-10-CM | POA: Diagnosis not present

## 2014-08-11 DIAGNOSIS — Z7901 Long term (current) use of anticoagulants: Secondary | ICD-10-CM | POA: Diagnosis not present

## 2014-08-11 LAB — POCT INR: INR: 1.4

## 2014-08-18 ENCOUNTER — Ambulatory Visit (INDEPENDENT_AMBULATORY_CARE_PROVIDER_SITE_OTHER): Payer: Medicare Other | Admitting: *Deleted

## 2014-08-18 DIAGNOSIS — I2699 Other pulmonary embolism without acute cor pulmonale: Secondary | ICD-10-CM | POA: Diagnosis not present

## 2014-08-18 DIAGNOSIS — Z7901 Long term (current) use of anticoagulants: Secondary | ICD-10-CM | POA: Diagnosis not present

## 2014-08-18 DIAGNOSIS — Z5181 Encounter for therapeutic drug level monitoring: Secondary | ICD-10-CM | POA: Diagnosis not present

## 2014-08-18 DIAGNOSIS — I4891 Unspecified atrial fibrillation: Secondary | ICD-10-CM

## 2014-08-18 LAB — POCT INR: INR: 2.8

## 2014-09-01 ENCOUNTER — Ambulatory Visit (INDEPENDENT_AMBULATORY_CARE_PROVIDER_SITE_OTHER): Payer: Medicare Other

## 2014-09-01 DIAGNOSIS — Z7901 Long term (current) use of anticoagulants: Secondary | ICD-10-CM | POA: Diagnosis not present

## 2014-09-01 DIAGNOSIS — I2699 Other pulmonary embolism without acute cor pulmonale: Secondary | ICD-10-CM

## 2014-09-01 DIAGNOSIS — Z5181 Encounter for therapeutic drug level monitoring: Secondary | ICD-10-CM

## 2014-09-01 DIAGNOSIS — I4891 Unspecified atrial fibrillation: Secondary | ICD-10-CM

## 2014-09-01 LAB — POCT INR: INR: 2.8

## 2014-10-06 ENCOUNTER — Ambulatory Visit (INDEPENDENT_AMBULATORY_CARE_PROVIDER_SITE_OTHER): Payer: Medicare Other

## 2014-10-06 DIAGNOSIS — Z5181 Encounter for therapeutic drug level monitoring: Secondary | ICD-10-CM

## 2014-10-06 DIAGNOSIS — I4891 Unspecified atrial fibrillation: Secondary | ICD-10-CM | POA: Diagnosis not present

## 2014-10-06 DIAGNOSIS — I2699 Other pulmonary embolism without acute cor pulmonale: Secondary | ICD-10-CM

## 2014-10-06 DIAGNOSIS — Z7901 Long term (current) use of anticoagulants: Secondary | ICD-10-CM | POA: Diagnosis not present

## 2014-10-06 LAB — POCT INR: INR: 2.2

## 2014-11-23 ENCOUNTER — Other Ambulatory Visit: Payer: Self-pay | Admitting: Cardiology

## 2014-12-30 ENCOUNTER — Other Ambulatory Visit: Payer: Self-pay | Admitting: Cardiology

## 2015-01-05 ENCOUNTER — Other Ambulatory Visit: Payer: Self-pay

## 2015-01-05 ENCOUNTER — Ambulatory Visit (INDEPENDENT_AMBULATORY_CARE_PROVIDER_SITE_OTHER): Payer: Medicare Other

## 2015-01-05 DIAGNOSIS — I2699 Other pulmonary embolism without acute cor pulmonale: Secondary | ICD-10-CM | POA: Diagnosis not present

## 2015-01-05 DIAGNOSIS — Z5181 Encounter for therapeutic drug level monitoring: Secondary | ICD-10-CM

## 2015-01-05 DIAGNOSIS — Z7901 Long term (current) use of anticoagulants: Secondary | ICD-10-CM

## 2015-01-05 DIAGNOSIS — I4891 Unspecified atrial fibrillation: Secondary | ICD-10-CM

## 2015-01-05 LAB — POCT INR: INR: 2.5

## 2015-01-05 NOTE — Telephone Encounter (Signed)
Received refill request from [facility] [place] [facility] for coumadin.Message sent to Coumadin clinic.

## 2015-01-05 NOTE — Telephone Encounter (Signed)
Refill request has been sent in previously as requested

## 2015-01-06 ENCOUNTER — Other Ambulatory Visit: Payer: Self-pay | Admitting: Pharmacist Clinician (PhC)/ Clinical Pharmacy Specialist

## 2015-01-06 MED ORDER — WARFARIN SODIUM 3 MG PO TABS: ORAL_TABLET | ORAL | Status: DC

## 2015-02-16 ENCOUNTER — Ambulatory Visit (INDEPENDENT_AMBULATORY_CARE_PROVIDER_SITE_OTHER): Payer: Medicare Other | Admitting: *Deleted

## 2015-02-16 DIAGNOSIS — Z7901 Long term (current) use of anticoagulants: Secondary | ICD-10-CM | POA: Diagnosis not present

## 2015-02-16 DIAGNOSIS — Z5181 Encounter for therapeutic drug level monitoring: Secondary | ICD-10-CM

## 2015-02-16 DIAGNOSIS — I2699 Other pulmonary embolism without acute cor pulmonale: Secondary | ICD-10-CM

## 2015-02-16 DIAGNOSIS — I4891 Unspecified atrial fibrillation: Secondary | ICD-10-CM | POA: Diagnosis not present

## 2015-02-16 LAB — POCT INR: INR: 1.8

## 2015-03-16 ENCOUNTER — Encounter (INDEPENDENT_AMBULATORY_CARE_PROVIDER_SITE_OTHER): Payer: Medicare Other

## 2015-03-16 DIAGNOSIS — I2699 Other pulmonary embolism without acute cor pulmonale: Secondary | ICD-10-CM

## 2015-03-16 DIAGNOSIS — Z7901 Long term (current) use of anticoagulants: Secondary | ICD-10-CM

## 2015-03-16 DIAGNOSIS — Z5181 Encounter for therapeutic drug level monitoring: Secondary | ICD-10-CM

## 2015-03-16 DIAGNOSIS — I4891 Unspecified atrial fibrillation: Secondary | ICD-10-CM

## 2015-03-24 ENCOUNTER — Other Ambulatory Visit: Payer: Self-pay | Admitting: Cardiology

## 2015-03-24 NOTE — Telephone Encounter (Signed)
Rx request sent to pharmacy.  

## 2015-04-13 ENCOUNTER — Ambulatory Visit (INDEPENDENT_AMBULATORY_CARE_PROVIDER_SITE_OTHER): Payer: Medicare Other | Admitting: *Deleted

## 2015-04-13 DIAGNOSIS — I482 Chronic atrial fibrillation, unspecified: Secondary | ICD-10-CM

## 2015-04-13 DIAGNOSIS — Z5181 Encounter for therapeutic drug level monitoring: Secondary | ICD-10-CM

## 2015-04-13 LAB — POCT INR: INR: 1.8

## 2015-04-27 ENCOUNTER — Ambulatory Visit (INDEPENDENT_AMBULATORY_CARE_PROVIDER_SITE_OTHER): Payer: Medicare Other | Admitting: *Deleted

## 2015-04-27 DIAGNOSIS — I4891 Unspecified atrial fibrillation: Secondary | ICD-10-CM | POA: Diagnosis not present

## 2015-04-27 DIAGNOSIS — Z7901 Long term (current) use of anticoagulants: Secondary | ICD-10-CM | POA: Diagnosis not present

## 2015-04-27 DIAGNOSIS — I2699 Other pulmonary embolism without acute cor pulmonale: Secondary | ICD-10-CM

## 2015-04-27 DIAGNOSIS — Z5181 Encounter for therapeutic drug level monitoring: Secondary | ICD-10-CM | POA: Diagnosis not present

## 2015-04-27 LAB — POCT INR: INR: 2

## 2015-05-18 ENCOUNTER — Ambulatory Visit (INDEPENDENT_AMBULATORY_CARE_PROVIDER_SITE_OTHER): Payer: Medicare Other | Admitting: *Deleted

## 2015-05-18 DIAGNOSIS — Z7901 Long term (current) use of anticoagulants: Secondary | ICD-10-CM | POA: Diagnosis not present

## 2015-05-18 DIAGNOSIS — Z5181 Encounter for therapeutic drug level monitoring: Secondary | ICD-10-CM | POA: Diagnosis not present

## 2015-05-18 DIAGNOSIS — I4891 Unspecified atrial fibrillation: Secondary | ICD-10-CM

## 2015-05-18 DIAGNOSIS — I2699 Other pulmonary embolism without acute cor pulmonale: Secondary | ICD-10-CM

## 2015-05-18 LAB — POCT INR: INR: 2.2

## 2015-06-08 ENCOUNTER — Other Ambulatory Visit: Payer: Self-pay | Admitting: Cardiology

## 2015-06-22 ENCOUNTER — Ambulatory Visit (INDEPENDENT_AMBULATORY_CARE_PROVIDER_SITE_OTHER): Payer: Managed Care, Other (non HMO) | Admitting: *Deleted

## 2015-06-22 DIAGNOSIS — I4891 Unspecified atrial fibrillation: Secondary | ICD-10-CM | POA: Diagnosis not present

## 2015-06-22 DIAGNOSIS — Z7901 Long term (current) use of anticoagulants: Secondary | ICD-10-CM | POA: Diagnosis not present

## 2015-06-22 DIAGNOSIS — Z5181 Encounter for therapeutic drug level monitoring: Secondary | ICD-10-CM

## 2015-06-22 DIAGNOSIS — I2699 Other pulmonary embolism without acute cor pulmonale: Secondary | ICD-10-CM | POA: Diagnosis not present

## 2015-06-22 LAB — POCT INR: INR: 2.4

## 2015-08-10 ENCOUNTER — Ambulatory Visit (INDEPENDENT_AMBULATORY_CARE_PROVIDER_SITE_OTHER): Payer: Managed Care, Other (non HMO) | Admitting: *Deleted

## 2015-08-10 DIAGNOSIS — Z5181 Encounter for therapeutic drug level monitoring: Secondary | ICD-10-CM

## 2015-08-10 DIAGNOSIS — I4891 Unspecified atrial fibrillation: Secondary | ICD-10-CM

## 2015-08-10 DIAGNOSIS — Z7901 Long term (current) use of anticoagulants: Secondary | ICD-10-CM

## 2015-08-10 DIAGNOSIS — I2699 Other pulmonary embolism without acute cor pulmonale: Secondary | ICD-10-CM

## 2015-08-10 LAB — POCT INR: INR: 1.6

## 2015-09-04 ENCOUNTER — Ambulatory Visit (INDEPENDENT_AMBULATORY_CARE_PROVIDER_SITE_OTHER): Payer: Managed Care, Other (non HMO) | Admitting: *Deleted

## 2015-09-04 DIAGNOSIS — I4891 Unspecified atrial fibrillation: Secondary | ICD-10-CM

## 2015-09-04 DIAGNOSIS — Z7901 Long term (current) use of anticoagulants: Secondary | ICD-10-CM | POA: Diagnosis not present

## 2015-09-04 DIAGNOSIS — I2699 Other pulmonary embolism without acute cor pulmonale: Secondary | ICD-10-CM | POA: Diagnosis not present

## 2015-09-04 DIAGNOSIS — Z5181 Encounter for therapeutic drug level monitoring: Secondary | ICD-10-CM | POA: Diagnosis not present

## 2015-09-04 LAB — POCT INR: INR: 2.3

## 2015-10-05 ENCOUNTER — Ambulatory Visit (INDEPENDENT_AMBULATORY_CARE_PROVIDER_SITE_OTHER): Payer: Medicare Other

## 2015-10-05 DIAGNOSIS — Z7901 Long term (current) use of anticoagulants: Secondary | ICD-10-CM

## 2015-10-05 DIAGNOSIS — I2699 Other pulmonary embolism without acute cor pulmonale: Secondary | ICD-10-CM

## 2015-10-05 DIAGNOSIS — I4891 Unspecified atrial fibrillation: Secondary | ICD-10-CM | POA: Diagnosis not present

## 2015-10-05 DIAGNOSIS — Z5181 Encounter for therapeutic drug level monitoring: Secondary | ICD-10-CM | POA: Diagnosis not present

## 2015-10-05 LAB — POCT INR: INR: 2.6

## 2015-10-21 ENCOUNTER — Other Ambulatory Visit: Payer: Self-pay | Admitting: Cardiology

## 2015-11-10 ENCOUNTER — Other Ambulatory Visit: Payer: Self-pay | Admitting: Cardiology

## 2015-11-16 ENCOUNTER — Ambulatory Visit (INDEPENDENT_AMBULATORY_CARE_PROVIDER_SITE_OTHER): Payer: Medicare Other

## 2015-11-16 DIAGNOSIS — Z5181 Encounter for therapeutic drug level monitoring: Secondary | ICD-10-CM | POA: Diagnosis not present

## 2015-11-16 DIAGNOSIS — Z7901 Long term (current) use of anticoagulants: Secondary | ICD-10-CM | POA: Diagnosis not present

## 2015-11-16 DIAGNOSIS — I2699 Other pulmonary embolism without acute cor pulmonale: Secondary | ICD-10-CM | POA: Diagnosis not present

## 2015-11-16 DIAGNOSIS — I4891 Unspecified atrial fibrillation: Secondary | ICD-10-CM | POA: Diagnosis not present

## 2015-11-16 LAB — POCT INR: INR: 2.5

## 2015-11-19 ENCOUNTER — Other Ambulatory Visit: Payer: Self-pay | Admitting: Cardiology

## 2015-12-27 ENCOUNTER — Other Ambulatory Visit: Payer: Self-pay | Admitting: Cardiology

## 2016-02-01 ENCOUNTER — Ambulatory Visit (INDEPENDENT_AMBULATORY_CARE_PROVIDER_SITE_OTHER): Payer: Medicare Other | Admitting: *Deleted

## 2016-02-01 DIAGNOSIS — I2699 Other pulmonary embolism without acute cor pulmonale: Secondary | ICD-10-CM | POA: Diagnosis not present

## 2016-02-01 DIAGNOSIS — I4891 Unspecified atrial fibrillation: Secondary | ICD-10-CM | POA: Diagnosis not present

## 2016-02-01 DIAGNOSIS — Z7901 Long term (current) use of anticoagulants: Secondary | ICD-10-CM

## 2016-02-01 DIAGNOSIS — Z5181 Encounter for therapeutic drug level monitoring: Secondary | ICD-10-CM | POA: Diagnosis not present

## 2016-02-01 LAB — POCT INR: INR: 2.5

## 2016-02-21 NOTE — Progress Notes (Deleted)
Cardiology Office Note   Date:  02/21/2016   ID:  [name], DOB 1945-10-08, MRN [id number]  PCP:  Henrine Screws, MD  Cardiologist:   Minus Breeding, MD  Referring:  ***  No chief complaint on file.     History of Present Illness: [name] is a 70 y.o. female who presents for follow up of atrial fib.   I have not seen her since 2015.  ***    Past Medical History:  Diagnosis Date  . ACUT VENUS EMBO&THROMB DEEP VES PROX LOWR EXTREM    Positive lupus anticoagulant  . Acute right MCA stroke 06/04/10   Treated with TPA and thrombectomy with hemorrhagic transformation; Carotids 3/12 negative for ICA stenosis  . Atrial fibrillation    On chronic Coumadin  . Atrial thrombosis   . Depression   . Diastolic heart failure    Echo 3/12: EF 55-60%; mild LVH, mild AI, mild MR, moderate to severe LAE, moderate RAE, PASP 44  . HIT (heparin-induced thrombocytopenia)   . HTN (hypertension)   . Obesity, unspecified   . PULMONARY EMBOLISM    History of IVC filter     Past Surgical History:  Procedure Laterality Date  . distal radius fracture. (12,/16/2010)    . IVC Placement 08/30/2005    . Open reduction and internal fixation of left intra-articular       Current Outpatient Prescriptions  Medication Sig Dispense Refill  . aspirin 81 MG tablet Take 81 mg by mouth daily.      . citalopram (CELEXA) 10 MG tablet Take 10 mg by mouth daily.      Marland Kitchen diltiazem (CARTIA XT) 300 MG 24 hr capsule Take 1 capsule (300 mg total) by mouth daily. 30 capsule 3  . docusate sodium (COLACE) 100 MG capsule Take 100 mg by mouth daily.      Marland Kitchen FLUoxetine (PROZAC) 10 MG capsule Take 10 mg by mouth daily.     . furosemide (LASIX) 40 MG tablet Take 1 tablet (40 mg total) by mouth daily. TAKE ONE TABLET BY MOUTH ONCE DAILY. NEED OFFICE VISIT FOR ADDITIONAL REFILLS. 30 tablet 6  . KLOR-CON M10 10 MEQ tablet TAKE ONE TABLET BY MOUTH ONCE DAILY 30 tablet 6  . metoprolol tartrate (LOPRESSOR)  25 MG tablet Take 0.5 tablets (12.5 mg total) by mouth 2 (two) times daily. Pt is overdue for follow with the doctor and needs to contact the office to schedule an appointment for more refills. [phone] 30 tablet 5  . metoprolol tartrate (LOPRESSOR) 25 MG tablet TAKE ONE-HALF TABLET BY MOUTH TWICE DAILY 30 tablet 5  . potassium chloride (K-DUR) 10 MEQ tablet Take 1 tablet (10 mEq total) by mouth daily. 30 tablet 11  . warfarin (COUMADIN) 3 MG tablet TAKE ONE AND ONE-HALF TO TWO TABLETS BY MOUTH ONCE DAILY AS DIRECTED BY COUMADIN CLINIC 60 tablet 4   No current facility-administered medications for this visit.     Allergies:   Heparin    ROS:  Please see the history of present illness.   Otherwise, review of systems are positive for {NONE DEFAULTED:18576::"none"}.   All other systems are reviewed and negative.    PHYSICAL EXAM: VS:  There were no vitals taken for this visit. , BMI There is no height or weight on file to calculate BMI. GENERAL:  Well appearing HEENT:  Pupils equal round and reactive, fundi not visualized, oral mucosa unremarkable NECK:  No jugular venous distention, waveform within normal  limits, carotid upstroke brisk and symmetric, no bruits, no thyromegaly LYMPHATICS:  No cervical, inguinal adenopathy LUNGS:  Clear to auscultation bilaterally BACK:  No CVA tenderness CHEST:  Unremarkable HEART:  PMI not displaced or sustained,S1 and S2 within normal limits, no S3, no S4, no clicks, no rubs, *** murmurs ABD:  Flat, positive bowel sounds normal in frequency in pitch, no bruits, no rebound, no guarding, no midline pulsatile mass, no hepatomegaly, no splenomegaly EXT:  2 plus pulses throughout, no edema, no cyanosis no clubbing SKIN:  No rashes no nodules NEURO:  Cranial nerves II through XII grossly intact, motor grossly intact throughout PSYCH:  Cognitively intact, oriented to person place and time    EKG:  EKG {ACTION; IS/IS GI:087931 ordered today. The ekg  ordered today demonstrates ***   Recent Labs: No results found for requested labs within last 8760 hours.    Lipid Panel    Component Value Date/Time   CHOL  06/04/2010 0359    139        ATP III CLASSIFICATION:  <200     mg/dL   Desirable  200-239  mg/dL   Borderline High  >=240    mg/dL   High          TRIG 71 06/04/2010 0359   HDL 33 (L) 06/04/2010 0359   CHOLHDL 4.2 06/04/2010 0359   VLDL 14 06/04/2010 0359   LDLCALC  06/04/2010 0359    92        Total Cholesterol/HDL:CHD Risk Coronary Heart Disease Risk Table                     Men   Women  1/2 Average Risk   3.4   3.3  Average Risk       5.0   4.4  2 X Average Risk   9.6   7.1  3 X Average Risk  23.4   11.0        Use the calculated Patient Ratio above and the CHD Risk Table to determine the patient's CHD Risk.        ATP III CLASSIFICATION (LDL):  <100     mg/dL   Optimal  100-129  mg/dL   Near or Above                    Optimal  130-159  mg/dL   Borderline  160-189  mg/dL   High  >190     mg/dL   Very High      Wt Readings from Last 3 Encounters:  02/12/14 270 lb (122.5 kg)  09/24/12 283 lb 12.8 oz (128.7 kg)  11/18/10 251 lb (113.9 kg)      Other studies Reviewed: Additional studies/ records that were reviewed today include: ***. Review of the above records demonstrates:  Please see elsewhere in the note.  ***   ASSESSMENT AND PLAN:  ATRIAL FIB:   Ms. JAWANDA CARANNANTE has a CHA2DS2 - VASc score of *** with a risk of stroke of ***%.  CHRONIC DIASTOLIC HF:  HTN:    EDEMA:    Current medicines are reviewed at length with the patient today.  The patient {ACTIONS; HAS/DOES NOT HAVE:19233} concerns regarding medicines.  The following changes have been made:  {PLAN; NO CHANGE:13088:s}  Labs/ tests ordered today include: *** No orders of the defined types were placed in this encounter.    Disposition:   FU with ***    Signed, Jeneen Rinks  Doristine Shehan, MD  02/21/2016 2:53 PM    [place]

## 2016-02-22 ENCOUNTER — Ambulatory Visit: Payer: Medicare Other | Admitting: Cardiology

## 2016-03-07 ENCOUNTER — Ambulatory Visit (INDEPENDENT_AMBULATORY_CARE_PROVIDER_SITE_OTHER): Payer: Medicare Other | Admitting: *Deleted

## 2016-03-07 DIAGNOSIS — Z5181 Encounter for therapeutic drug level monitoring: Secondary | ICD-10-CM

## 2016-03-07 DIAGNOSIS — I2699 Other pulmonary embolism without acute cor pulmonale: Secondary | ICD-10-CM | POA: Diagnosis not present

## 2016-03-07 DIAGNOSIS — I4891 Unspecified atrial fibrillation: Secondary | ICD-10-CM | POA: Diagnosis not present

## 2016-03-07 DIAGNOSIS — Z7901 Long term (current) use of anticoagulants: Secondary | ICD-10-CM | POA: Diagnosis not present

## 2016-03-07 LAB — POCT INR: INR: 2.5

## 2016-03-17 ENCOUNTER — Other Ambulatory Visit: Payer: Self-pay | Admitting: Cardiology

## 2016-04-18 ENCOUNTER — Ambulatory Visit (INDEPENDENT_AMBULATORY_CARE_PROVIDER_SITE_OTHER): Payer: Medicare Other

## 2016-04-18 DIAGNOSIS — I2699 Other pulmonary embolism without acute cor pulmonale: Secondary | ICD-10-CM

## 2016-04-18 DIAGNOSIS — Z5181 Encounter for therapeutic drug level monitoring: Secondary | ICD-10-CM

## 2016-04-18 DIAGNOSIS — Z7901 Long term (current) use of anticoagulants: Secondary | ICD-10-CM | POA: Diagnosis not present

## 2016-04-18 DIAGNOSIS — I4891 Unspecified atrial fibrillation: Secondary | ICD-10-CM | POA: Diagnosis not present

## 2016-04-18 LAB — POCT INR: INR: 2.4

## 2016-05-30 ENCOUNTER — Ambulatory Visit (INDEPENDENT_AMBULATORY_CARE_PROVIDER_SITE_OTHER): Payer: Medicare Other | Admitting: *Deleted

## 2016-05-30 DIAGNOSIS — Z5181 Encounter for therapeutic drug level monitoring: Secondary | ICD-10-CM | POA: Diagnosis not present

## 2016-05-30 DIAGNOSIS — I4891 Unspecified atrial fibrillation: Secondary | ICD-10-CM | POA: Diagnosis not present

## 2016-05-30 DIAGNOSIS — I2699 Other pulmonary embolism without acute cor pulmonale: Secondary | ICD-10-CM

## 2016-05-30 DIAGNOSIS — Z7901 Long term (current) use of anticoagulants: Secondary | ICD-10-CM

## 2016-05-30 LAB — POCT INR: INR: 1.7

## 2016-06-13 ENCOUNTER — Ambulatory Visit (INDEPENDENT_AMBULATORY_CARE_PROVIDER_SITE_OTHER): Payer: Medicare Other | Admitting: Pharmacist

## 2016-06-13 DIAGNOSIS — Z7901 Long term (current) use of anticoagulants: Secondary | ICD-10-CM | POA: Diagnosis not present

## 2016-06-13 DIAGNOSIS — I482 Chronic atrial fibrillation, unspecified: Secondary | ICD-10-CM

## 2016-06-13 DIAGNOSIS — Z5181 Encounter for therapeutic drug level monitoring: Secondary | ICD-10-CM

## 2016-06-13 DIAGNOSIS — I2699 Other pulmonary embolism without acute cor pulmonale: Secondary | ICD-10-CM

## 2016-06-13 DIAGNOSIS — I4891 Unspecified atrial fibrillation: Secondary | ICD-10-CM

## 2016-06-13 LAB — POCT INR: INR: 1.9

## 2016-06-30 ENCOUNTER — Encounter: Payer: Self-pay | Admitting: Cardiology

## 2016-06-30 ENCOUNTER — Other Ambulatory Visit: Payer: Self-pay | Admitting: Cardiology

## 2016-07-01 ENCOUNTER — Other Ambulatory Visit: Payer: Self-pay | Admitting: Cardiology

## 2016-07-01 NOTE — Telephone Encounter (Signed)
REFILL 

## 2016-07-16 ENCOUNTER — Other Ambulatory Visit: Payer: Self-pay | Admitting: Cardiology

## 2016-07-25 ENCOUNTER — Ambulatory Visit (INDEPENDENT_AMBULATORY_CARE_PROVIDER_SITE_OTHER): Payer: Medicare Other

## 2016-07-25 DIAGNOSIS — I4891 Unspecified atrial fibrillation: Secondary | ICD-10-CM

## 2016-07-25 DIAGNOSIS — Z5181 Encounter for therapeutic drug level monitoring: Secondary | ICD-10-CM

## 2016-07-25 DIAGNOSIS — Z7901 Long term (current) use of anticoagulants: Secondary | ICD-10-CM

## 2016-07-25 DIAGNOSIS — I2699 Other pulmonary embolism without acute cor pulmonale: Secondary | ICD-10-CM | POA: Diagnosis not present

## 2016-07-25 LAB — POCT INR: INR: 1.4

## 2016-08-08 ENCOUNTER — Ambulatory Visit (INDEPENDENT_AMBULATORY_CARE_PROVIDER_SITE_OTHER): Payer: Medicare Other | Admitting: *Deleted

## 2016-08-08 DIAGNOSIS — Z5181 Encounter for therapeutic drug level monitoring: Secondary | ICD-10-CM

## 2016-08-08 DIAGNOSIS — Z7901 Long term (current) use of anticoagulants: Secondary | ICD-10-CM | POA: Diagnosis not present

## 2016-08-08 DIAGNOSIS — I2699 Other pulmonary embolism without acute cor pulmonale: Secondary | ICD-10-CM | POA: Diagnosis not present

## 2016-08-08 DIAGNOSIS — I4891 Unspecified atrial fibrillation: Secondary | ICD-10-CM

## 2016-08-08 LAB — POCT INR: INR: 2.9

## 2016-08-31 ENCOUNTER — Ambulatory Visit (INDEPENDENT_AMBULATORY_CARE_PROVIDER_SITE_OTHER): Payer: Medicare Other | Admitting: Pharmacist

## 2016-08-31 DIAGNOSIS — I2699 Other pulmonary embolism without acute cor pulmonale: Secondary | ICD-10-CM | POA: Diagnosis not present

## 2016-08-31 DIAGNOSIS — Z5181 Encounter for therapeutic drug level monitoring: Secondary | ICD-10-CM

## 2016-08-31 DIAGNOSIS — I4891 Unspecified atrial fibrillation: Secondary | ICD-10-CM | POA: Diagnosis not present

## 2016-08-31 DIAGNOSIS — Z7901 Long term (current) use of anticoagulants: Secondary | ICD-10-CM

## 2016-08-31 LAB — POCT INR: INR: 2.2

## 2016-10-05 ENCOUNTER — Other Ambulatory Visit: Payer: Self-pay | Admitting: Cardiology

## 2016-10-14 ENCOUNTER — Ambulatory Visit (INDEPENDENT_AMBULATORY_CARE_PROVIDER_SITE_OTHER): Payer: Medicare Other | Admitting: *Deleted

## 2016-10-14 DIAGNOSIS — Z5181 Encounter for therapeutic drug level monitoring: Secondary | ICD-10-CM

## 2016-10-14 DIAGNOSIS — Z7901 Long term (current) use of anticoagulants: Secondary | ICD-10-CM

## 2016-10-14 DIAGNOSIS — I4891 Unspecified atrial fibrillation: Secondary | ICD-10-CM

## 2016-10-14 DIAGNOSIS — I2699 Other pulmonary embolism without acute cor pulmonale: Secondary | ICD-10-CM

## 2016-10-14 LAB — POCT INR: INR: 1.9

## 2016-10-20 ENCOUNTER — Other Ambulatory Visit: Payer: Self-pay | Admitting: Cardiology

## 2016-10-30 ENCOUNTER — Other Ambulatory Visit: Payer: Self-pay | Admitting: Cardiology

## 2016-10-31 NOTE — Telephone Encounter (Signed)
REFILL 

## 2016-11-21 ENCOUNTER — Ambulatory Visit (INDEPENDENT_AMBULATORY_CARE_PROVIDER_SITE_OTHER): Payer: Medicare Other | Admitting: Pharmacist

## 2016-11-21 DIAGNOSIS — I2699 Other pulmonary embolism without acute cor pulmonale: Secondary | ICD-10-CM

## 2016-11-21 DIAGNOSIS — I4891 Unspecified atrial fibrillation: Secondary | ICD-10-CM | POA: Diagnosis not present

## 2016-11-21 DIAGNOSIS — Z5181 Encounter for therapeutic drug level monitoring: Secondary | ICD-10-CM

## 2016-11-21 DIAGNOSIS — Z7901 Long term (current) use of anticoagulants: Secondary | ICD-10-CM | POA: Diagnosis not present

## 2016-11-21 LAB — POCT INR: INR: 1.8

## 2017-01-25 ENCOUNTER — Other Ambulatory Visit: Payer: Self-pay | Admitting: Cardiology

## 2017-01-25 NOTE — Telephone Encounter (Signed)
REFILL 

## 2017-02-06 ENCOUNTER — Other Ambulatory Visit: Payer: Self-pay | Admitting: Cardiology

## 2017-02-15 ENCOUNTER — Other Ambulatory Visit: Payer: Self-pay

## 2017-02-15 ENCOUNTER — Inpatient Hospital Stay (HOSPITAL_COMMUNITY)
Admission: EM | Admit: 2017-02-15 | Discharge: 2017-02-21 | DRG: 253 | Disposition: A | Discharge status: Home / Self Care | Payer: Medicare Other | Attending: Vascular Surgery | Admitting: Vascular Surgery

## 2017-02-15 ENCOUNTER — Encounter (HOSPITAL_COMMUNITY)
Admission: EM | Disposition: A | Discharge status: Home / Self Care | Payer: Self-pay | Source: Home / Self Care | Attending: Vascular Surgery

## 2017-02-15 ENCOUNTER — Emergency Department (HOSPITAL_COMMUNITY): Payer: Medicare Other

## 2017-02-15 ENCOUNTER — Emergency Department (HOSPITAL_BASED_OUTPATIENT_CLINIC_OR_DEPARTMENT_OTHER): Admit: 2017-02-15 | Discharge: 2017-02-15 | Disposition: A | Discharge status: Home / Self Care | Payer: Medicare Other

## 2017-02-15 ENCOUNTER — Emergency Department (HOSPITAL_COMMUNITY): Payer: Medicare Other | Admitting: Anesthesiology

## 2017-02-15 ENCOUNTER — Encounter (HOSPITAL_COMMUNITY): Payer: Self-pay

## 2017-02-15 DIAGNOSIS — I749 Embolism and thrombosis of unspecified artery: Secondary | ICD-10-CM

## 2017-02-15 DIAGNOSIS — Z6841 Body Mass Index (BMI) 40.0 and over, adult: Secondary | ICD-10-CM

## 2017-02-15 DIAGNOSIS — I5030 Unspecified diastolic (congestive) heart failure: Secondary | ICD-10-CM | POA: Diagnosis not present

## 2017-02-15 DIAGNOSIS — I482 Chronic atrial fibrillation: Secondary | ICD-10-CM | POA: Diagnosis not present

## 2017-02-15 DIAGNOSIS — Z7901 Long term (current) use of anticoagulants: Secondary | ICD-10-CM | POA: Diagnosis not present

## 2017-02-15 DIAGNOSIS — F329 Major depressive disorder, single episode, unspecified: Secondary | ICD-10-CM | POA: Diagnosis present

## 2017-02-15 DIAGNOSIS — Z23 Encounter for immunization: Secondary | ICD-10-CM | POA: Diagnosis not present

## 2017-02-15 DIAGNOSIS — I2699 Other pulmonary embolism without acute cor pulmonale: Secondary | ICD-10-CM | POA: Diagnosis not present

## 2017-02-15 DIAGNOSIS — I4891 Unspecified atrial fibrillation: Secondary | ICD-10-CM | POA: Diagnosis not present

## 2017-02-15 DIAGNOSIS — R609 Edema, unspecified: Secondary | ICD-10-CM

## 2017-02-15 DIAGNOSIS — Z86718 Personal history of other venous thrombosis and embolism: Secondary | ICD-10-CM | POA: Diagnosis not present

## 2017-02-15 DIAGNOSIS — I82431 Acute embolism and thrombosis of right popliteal vein: Principal | ICD-10-CM | POA: Diagnosis present

## 2017-02-15 DIAGNOSIS — R200 Anesthesia of skin: Secondary | ICD-10-CM

## 2017-02-15 DIAGNOSIS — Z888 Allergy status to other drugs, medicaments and biological substances status: Secondary | ICD-10-CM

## 2017-02-15 DIAGNOSIS — Z8673 Personal history of transient ischemic attack (TIA), and cerebral infarction without residual deficits: Secondary | ICD-10-CM

## 2017-02-15 DIAGNOSIS — M79609 Pain in unspecified limb: Secondary | ICD-10-CM

## 2017-02-15 DIAGNOSIS — I110 Hypertensive heart disease with heart failure: Secondary | ICD-10-CM | POA: Diagnosis not present

## 2017-02-15 DIAGNOSIS — I10 Essential (primary) hypertension: Secondary | ICD-10-CM | POA: Diagnosis not present

## 2017-02-15 DIAGNOSIS — D6862 Lupus anticoagulant syndrome: Secondary | ICD-10-CM | POA: Diagnosis present

## 2017-02-15 DIAGNOSIS — I743 Embolism and thrombosis of arteries of the lower extremities: Secondary | ICD-10-CM | POA: Diagnosis present

## 2017-02-15 DIAGNOSIS — R29818 Other symptoms and signs involving the nervous system: Secondary | ICD-10-CM | POA: Diagnosis not present

## 2017-02-15 DIAGNOSIS — M79604 Pain in right leg: Secondary | ICD-10-CM

## 2017-02-15 DIAGNOSIS — I998 Other disorder of circulatory system: Secondary | ICD-10-CM | POA: Diagnosis not present

## 2017-02-15 HISTORY — DX: Lupus anticoagulant syndrome: D68.62

## 2017-02-15 HISTORY — DX: Embolism and thrombosis of arteries of the lower extremities: I74.3

## 2017-02-15 HISTORY — DX: Unspecified diastolic (congestive) heart failure: I50.30

## 2017-02-15 HISTORY — PX: EMBOLECTOMY: SHX44

## 2017-02-15 HISTORY — DX: Personal history of other venous thrombosis and embolism: Z86.718

## 2017-02-15 HISTORY — DX: Permanent atrial fibrillation: I48.21

## 2017-02-15 LAB — CBC
HCT: 42.4 % (ref 36.0–46.0)
Hemoglobin: 14 g/dL (ref 12.0–15.0)
MCH: 32.7 pg (ref 26.0–34.0)
MCHC: 33 g/dL (ref 30.0–36.0)
MCV: 99.1 fL (ref 78.0–100.0)
Platelets: 168 K/uL (ref 150–400)
RBC: 4.28 MIL/uL (ref 3.87–5.11)
RDW: 13.3 % (ref 11.5–15.5)
WBC: 8.2 K/uL (ref 4.0–10.5)

## 2017-02-15 LAB — DIFFERENTIAL
BASOS ABS: 0 10*3/uL (ref 0.0–0.1)
BASOS PCT: 0 %
Eosinophils Absolute: 0.1 10*3/uL (ref 0.0–0.7)
Eosinophils Relative: 2 %
LYMPHS ABS: 1.1 10*3/uL (ref 0.7–4.0)
Lymphocytes Relative: 14 %
MONOS PCT: 6 %
Monocytes Absolute: 0.5 10*3/uL (ref 0.1–1.0)
NEUTROS ABS: 6.5 10*3/uL (ref 1.7–7.7)
NEUTROS PCT: 78 %

## 2017-02-15 LAB — I-STAT CHEM 8, ED
BUN: 8 mg/dL (ref 6–20)
CHLORIDE: 102 mmol/L (ref 101–111)
CREATININE: 0.7 mg/dL (ref 0.44–1.00)
Calcium, Ion: 1.03 mmol/L — ABNORMAL LOW (ref 1.15–1.40)
GLUCOSE: 141 mg/dL — AB (ref 65–99)
HEMATOCRIT: 43 % (ref 36.0–46.0)
HEMOGLOBIN: 14.6 g/dL (ref 12.0–15.0)
POTASSIUM: 3.9 mmol/L (ref 3.5–5.1)
Sodium: 139 mmol/L (ref 135–145)
TCO2: 27 mmol/L (ref 22–32)

## 2017-02-15 LAB — COMPREHENSIVE METABOLIC PANEL
ALBUMIN: 3.5 g/dL (ref 3.5–5.0)
ALT: 15 U/L (ref 14–54)
AST: 21 U/L (ref 15–41)
Alkaline Phosphatase: 97 U/L (ref 38–126)
Anion gap: 9 (ref 5–15)
BUN: 6 mg/dL (ref 6–20)
CHLORIDE: 104 mmol/L (ref 101–111)
CO2: 24 mmol/L (ref 22–32)
CREATININE: 0.9 mg/dL (ref 0.44–1.00)
Calcium: 8.6 mg/dL — ABNORMAL LOW (ref 8.9–10.3)
GFR calc Af Amer: >60 mL/min (ref >60)
GLUCOSE: 143 mg/dL — AB (ref 65–99)
Potassium: 3.4 mmol/L — ABNORMAL LOW (ref 3.5–5.1)
Sodium: 137 mmol/L (ref 135–145)
Total Bilirubin: 0.8 mg/dL (ref 0.3–1.2)
Total Protein: 7.6 g/dL (ref 6.5–8.1)

## 2017-02-15 LAB — I-STAT TROPONIN, ED: Troponin i, poc: 0 ng/mL (ref 0.00–0.08)

## 2017-02-15 LAB — PROTIME-INR
INR: 1.43
Prothrombin Time: 17.3 seconds — ABNORMAL HIGH (ref 11.4–15.2)

## 2017-02-15 LAB — APTT: aPTT: 30 s (ref 24–36)

## 2017-02-15 SURGERY — EMBOLECTOMY
Anesthesia: General | Site: Leg Lower | Laterality: Right

## 2017-02-15 MED ORDER — FENTANYL CITRATE (PF) 100 MCG/2ML IJ SOLN
INTRAMUSCULAR | Status: DC | PRN
  Administered 2017-02-15 (×2): 50 ug via INTRAVENOUS
  Administered 2017-02-15: 100 ug via INTRAVENOUS
  Administered 2017-02-15: 50 ug via INTRAVENOUS

## 2017-02-15 MED ORDER — 0.9 % SODIUM CHLORIDE (POUR BTL) OPTIME
TOPICAL | Status: DC | PRN
  Administered 2017-02-15: 2000 mL

## 2017-02-15 MED ORDER — DEXAMETHASONE SODIUM PHOSPHATE 10 MG/ML IJ SOLN
INTRAMUSCULAR | Status: DC | PRN
  Administered 2017-02-15: 5 mg via INTRAVENOUS

## 2017-02-15 MED ORDER — SODIUM CHLORIDE 0.9 % IV SOLN
INTRAVENOUS | Status: AC | PRN
  Administered 2017-02-15: 500 mL

## 2017-02-15 MED ORDER — MIDAZOLAM HCL 5 MG/5ML IJ SOLN
INTRAMUSCULAR | Status: DC | PRN
  Administered 2017-02-15: 1 mg via INTRAVENOUS

## 2017-02-15 MED ORDER — SUCCINYLCHOLINE CHLORIDE 20 MG/ML IJ SOLN
INTRAMUSCULAR | Status: DC | PRN
  Administered 2017-02-15: 120 mg via INTRAVENOUS

## 2017-02-15 MED ORDER — PHENYLEPHRINE HCL 10 MG/ML IJ SOLN
INTRAVENOUS | Status: DC | PRN
  Administered 2017-02-15: 25 ug/min via INTRAVENOUS

## 2017-02-15 MED ORDER — BIVALIRUDIN BOLUS VIA INFUSION
0.1000 mg/kg | Freq: Once | INTRAVENOUS | Status: AC
  Administered 2017-02-15: 13.2 mg via INTRAVENOUS
  Filled 2017-02-15: qty 14

## 2017-02-15 MED ORDER — BIVALIRUDIN BOLUS VIA INFUSION: 0.1000 mg/kg | Freq: Once | INTRAVENOUS | Status: DC

## 2017-02-15 MED ORDER — MIDAZOLAM HCL 2 MG/2ML IJ SOLN
INTRAMUSCULAR | Status: AC
  Filled 2017-02-15: qty 2

## 2017-02-15 MED ORDER — ROCURONIUM BROMIDE 100 MG/10ML IV SOLN
INTRAVENOUS | Status: DC | PRN
  Administered 2017-02-15: 30 mg via INTRAVENOUS

## 2017-02-15 MED ORDER — BIVALIRUDIN TRIFLUOROACETATE 250 MG IV SOLR
0.1500 mg/kg/h | INTRAVENOUS | Status: DC
  Filled 2017-02-15: qty 250

## 2017-02-15 MED ORDER — IOPAMIDOL (ISOVUE-370) INJECTION 76%
INTRAVENOUS | Status: AC
  Administered 2017-02-15: 100 mL
  Filled 2017-02-15: qty 100

## 2017-02-15 MED ORDER — CEFAZOLIN SODIUM-DEXTROSE 2-3 GM-%(50ML) IV SOLR
INTRAVENOUS | Status: DC | PRN
  Administered 2017-02-15: 2 g via INTRAVENOUS

## 2017-02-15 MED ORDER — LACTATED RINGERS IV SOLN
INTRAVENOUS | Status: DC | PRN
  Administered 2017-02-15: 22:00:00 via INTRAVENOUS

## 2017-02-15 MED ORDER — PROPOFOL 10 MG/ML IV BOLUS
INTRAVENOUS | Status: DC | PRN
  Administered 2017-02-15: 150 mg via INTRAVENOUS

## 2017-02-15 MED ORDER — FENTANYL CITRATE (PF) 250 MCG/5ML IJ SOLN
INTRAMUSCULAR | Status: AC
  Filled 2017-02-15: qty 5

## 2017-02-15 MED ORDER — SODIUM CHLORIDE 0.9 % IV SOLN
0.2500 mg/kg/h | INTRAVENOUS | Status: DC
  Administered 2017-02-15 – 2017-02-18 (×8): 0.25 mg/kg/h via INTRAVENOUS
  Filled 2017-02-15 (×8): qty 250

## 2017-02-15 MED ORDER — METOPROLOL TARTRATE 25 MG PO TABS
12.5000 mg | ORAL_TABLET | Freq: Once | ORAL | Status: AC
  Administered 2017-02-15: 12.5 mg via ORAL
  Filled 2017-02-15: qty 1

## 2017-02-15 MED ORDER — ALBUTEROL SULFATE HFA 108 (90 BASE) MCG/ACT IN AERS
INHALATION_SPRAY | RESPIRATORY_TRACT | Status: DC | PRN
  Administered 2017-02-15: 3 via RESPIRATORY_TRACT

## 2017-02-15 MED ORDER — LIDOCAINE HCL (CARDIAC) 20 MG/ML IV SOLN
INTRAVENOUS | Status: DC | PRN
  Administered 2017-02-15: 50 mg via INTRAVENOUS

## 2017-02-15 SURGICAL SUPPLY — 56 items
ADH SKN CLS APL DERMABOND .7 (GAUZE/BANDAGES/DRESSINGS)
BANDAGE ESMARK 6X9 LF (GAUZE/BANDAGES/DRESSINGS) IMPLANT
BNDG CMPR 9X6 STRL LF SNTH (GAUZE/BANDAGES/DRESSINGS)
BNDG ESMARK 6X9 LF (GAUZE/BANDAGES/DRESSINGS)
BOOT SUTURE AID YELLOW STND (SUTURE) ×1 IMPLANT
CANISTER SUCT 3000ML PPV (MISCELLANEOUS) ×2 IMPLANT
CANNULA VESSEL 3MM 2 BLNT TIP (CANNULA) ×2 IMPLANT
CATH EMB 3FR 80CM (CATHETERS) ×1 IMPLANT
CATH EMB 4FR 80CM (CATHETERS) ×1 IMPLANT
CATH EMB 5FR 80CM (CATHETERS) IMPLANT
CLIP VESOCCLUDE MED 24/CT (CLIP) ×2 IMPLANT
CLIP VESOCCLUDE SM WIDE 24/CT (CLIP) ×2 IMPLANT
CONT SPEC 4OZ CLIKSEAL STRL BL (MISCELLANEOUS) ×1 IMPLANT
DECANTER SPIKE VIAL GLASS SM (MISCELLANEOUS) IMPLANT
DERMABOND ADVANCED (GAUZE/BANDAGES/DRESSINGS)
DERMABOND ADVANCED .7 DNX12 (GAUZE/BANDAGES/DRESSINGS) IMPLANT
DRAIN SNY 10X20 3/4 PERF (WOUND CARE) IMPLANT
DRAPE X-RAY CASS 24X20 (DRAPES) IMPLANT
DRSG COVADERM 4X10 (GAUZE/BANDAGES/DRESSINGS) ×1 IMPLANT
ELECT REM PT RETURN 9FT ADLT (ELECTROSURGICAL) ×2
ELECTRODE REM PT RTRN 9FT ADLT (ELECTROSURGICAL) ×1 IMPLANT
EVACUATOR SILICONE 100CC (DRAIN) IMPLANT
GLOVE BIO SURGEON STRL SZ7.5 (GLOVE) ×2 IMPLANT
GLOVE BIOGEL M 6.5 STRL (GLOVE) ×2 IMPLANT
GLOVE BIOGEL PI IND STRL 7.0 (GLOVE) IMPLANT
GLOVE BIOGEL PI IND STRL 8.5 (GLOVE) IMPLANT
GLOVE BIOGEL PI INDICATOR 7.0 (GLOVE) ×4
GLOVE BIOGEL PI INDICATOR 8.5 (GLOVE) ×1
GLOVE SS BIOGEL STRL SZ 8 (GLOVE) IMPLANT
GLOVE SUPERSENSE BIOGEL SZ 8 (GLOVE) ×1
GOWN STRL REUS W/ TWL LRG LVL3 (GOWN DISPOSABLE) ×3 IMPLANT
GOWN STRL REUS W/TWL LRG LVL3 (GOWN DISPOSABLE) ×6
KIT BASIN OR (CUSTOM PROCEDURE TRAY) ×2 IMPLANT
KIT ROOM TURNOVER OR (KITS) ×2 IMPLANT
MARKER SKIN DUAL TIP RULER LAB (MISCELLANEOUS) ×1 IMPLANT
NS IRRIG 1000ML POUR BTL (IV SOLUTION) ×4 IMPLANT
PACK PERIPHERAL VASCULAR (CUSTOM PROCEDURE TRAY) ×2 IMPLANT
PAD ARMBOARD 7.5X6 YLW CONV (MISCELLANEOUS) ×4 IMPLANT
SET COLLECT BLD 21X3/4 12 (NEEDLE) IMPLANT
SPONGE SURGIFOAM ABS GEL 100 (HEMOSTASIS) IMPLANT
STAPLER VISISTAT 35W (STAPLE) ×1 IMPLANT
STOPCOCK 4 WAY LG BORE MALE ST (IV SETS) IMPLANT
SUT PROLENE 5 0 C 1 24 (SUTURE) ×4 IMPLANT
SUT PROLENE 6 0 CC (SUTURE) ×2 IMPLANT
SUT PROLENE 7 0 BV1 MDA (SUTURE) ×2 IMPLANT
SUT VIC AB 2-0 CTX 36 (SUTURE) ×2 IMPLANT
SUT VIC AB 3-0 SH 27 (SUTURE) ×2
SUT VIC AB 3-0 SH 27X BRD (SUTURE) ×1 IMPLANT
SYR 3ML LL SCALE MARK (SYRINGE) ×2 IMPLANT
SYR TB 1ML LUER SLIP (SYRINGE) ×1 IMPLANT
TOWEL GREEN STERILE (TOWEL DISPOSABLE) ×1 IMPLANT
TOWEL GREEN STERILE FF (TOWEL DISPOSABLE) ×1 IMPLANT
TRAY FOLEY W/METER SILVER 16FR (SET/KITS/TRAYS/PACK) ×2 IMPLANT
TUBING EXTENTION W/L.L. (IV SETS) IMPLANT
UNDERPAD 30X30 (UNDERPADS AND DIAPERS) ×2 IMPLANT
WATER STERILE IRR 1000ML POUR (IV SOLUTION) ×2 IMPLANT

## 2017-02-15 NOTE — ED Notes (Signed)
Pharmacy to discuss Angiomax with Main Pharmacist, will transport pt to short stay bay [facility] after hearing from pharmacist.

## 2017-02-15 NOTE — ED Provider Notes (Signed)
Pt care assumed at 1630.  Pt here with acute onset RLE numbness from knee down.  Presentation not c/w CVA, no evidence of DVT.  CTA RLE pending to eval for potential arterial thrombus.    CT demonstrates right popliteal thrombus.  Vascular surgery consulted for management.  No heparin given based on patient's allergy.  Patient updated of findings of studies and she is in agreement with treatment plan.     Quintella Reichert, MD 02/15/17 2115

## 2017-02-15 NOTE — ED Provider Notes (Signed)
[facility] EMERGENCY DEPARTMENT Provider Note   CSN: [id number] Arrival date & time: 02/15/17  1314     History   Chief Complaint Chief Complaint  Patient presents with  . Numbness    HPI CHASSIDY LAYSON is a 71 y.o. female.  HPI   Patient is a 71 year old female presenting with numbness to right lower extremity.  Patient reports she is in her normal state of health and then all of a sudden 11 AM started feeling numb from her knee down.  She reports a months amount of pain in the back part of her right knee.  Patient called code stroke from triage.  Nursing reports that she is very unstable on her feet when trying to pivot.  Otherwise no neurological symptoms.  She has no weakness in the right lower extremity.  Past Medical History:  Diagnosis Date  . ACUT VENUS EMBO&THROMB DEEP VES PROX LOWR EXTREM    Positive lupus anticoagulant  . Acute right MCA stroke ([place]) 06/04/10   Treated with TPA and thrombectomy with hemorrhagic transformation; Carotids 3/12 negative for ICA stenosis  . Atrial fibrillation ([facility])    On chronic Coumadin  . Atrial thrombosis   . Depression   . Diastolic heart failure    Echo 3/12: EF 55-60%; mild LVH, mild AI, mild MR, moderate to severe LAE, moderate RAE, PASP 44  . HIT (heparin-induced thrombocytopenia) ([place])   . HTN (hypertension)   . Obesity, unspecified   . PULMONARY EMBOLISM    History of IVC filter     Patient Active Problem List   Diagnosis Date Noted  . Encounter for therapeutic drug monitoring 06/11/2013  . CVA (cerebral infarction) 11/18/2010  . Edema 08/18/2010  . Long term (current) use of anticoagulants 08/09/2010  . OBESITY, UNSPECIFIED 08/14/2008  . ATRIAL FIBRILLATION 03/07/2008  . PULMONARY EMBOLISM 08/30/2005  . ACUT VENUS EMBO&THROMB DEEP VES PROX LOWR EXTREM 08/30/2005    Past Surgical History:  Procedure Laterality Date  . distal radius fracture. (12,/16/2010)    . IVC Placement 08/30/2005    .  Open reduction and internal fixation of left intra-articular      OB History    No data available       Home Medications    Prior to Admission medications   Medication Sig Start Date End Date Taking? Authorizing Provider  aspirin 81 MG tablet Take 81 mg by mouth daily.      [provider]  CARTIA XT 300 MG 24 hr capsule TAKE 1 CAPSULE BY MOUTH ONCE DAILY 01/25/17   Minus Breeding, MD  citalopram (CELEXA) 10 MG tablet Take 10 mg by mouth daily.      [provider]  docusate sodium (COLACE) 100 MG capsule Take 100 mg by mouth daily.      [provider]  FLUoxetine (PROZAC) 10 MG capsule Take 10 mg by mouth daily.  09/11/12   [provider]  furosemide (LASIX) 40 MG tablet Take 1 tablet (40 mg total) by mouth daily. TAKE ONE TABLET BY MOUTH ONCE DAILY. NEED OFFICE VISIT FOR ADDITIONAL REFILLS. 09/24/12   Burtis Junes, NP  KLOR-CON M10 10 MEQ tablet TAKE ONE TABLET BY MOUTH ONCE DAILY 07/17/12   Minus Breeding, MD  metoprolol tartrate (LOPRESSOR) 25 MG tablet Take 0.5 tablets (12.5 mg total) by mouth 2 (two) times daily. KEEP OV. 10/31/16   Minus Breeding, MD  potassium chloride (K-DUR) 10 MEQ tablet Take 1 tablet (10 mEq total)  by mouth daily. 08/18/10 08/18/11  Richardson Dopp T, PA-C  warfarin (COUMADIN) 3 MG tablet Take 1.5 to 2 tablets by mouth once daily as directed - needs INR and MD appt for further refills 02/06/17   Minus Breeding, MD    Family History Family History  Problem Relation Age of Onset  . Coronary artery disease Unknown   . Diabetes Unknown     Social History Social History   Tobacco Use  . Smoking status: Never Smoker  . Smokeless tobacco: Never Used  Substance Use Topics  . Alcohol use: No  . Drug use: No     Allergies   Heparin   Review of Systems Review of Systems  Constitutional: Negative for activity change.  Respiratory: Negative for shortness of breath.   Cardiovascular: Negative for chest pain.    Gastrointestinal: Negative for abdominal pain.  Neurological: Positive for speech difficulty and numbness. Negative for dizziness, tremors, syncope, facial asymmetry, weakness and headaches.  All other systems reviewed and are negative.    Physical Exam Updated Vital Signs BP (!) 166/88 (BP Location: Left Arm)   Pulse (!) 107   Temp 97.7 F (36.5 C) (Oral)   Resp 18   SpO2 99%   Physical Exam  Constitutional: She is oriented to person, place, and time. She appears well-developed and well-nourished.  HENT:  Head: Normocephalic and atraumatic.  Eyes: Right eye exhibits no discharge.  Cardiovascular: Normal rate, regular rhythm and normal heart sounds.  No murmur heard. Pulmonary/Chest: Effort normal and breath sounds normal. She has no wheezes. She has no rales.  Abdominal: Soft. She exhibits no distension. There is no tenderness.  Neurological: She is oriented to person, place, and time. No cranial nerve deficit. Coordination abnormal.  Numbness to RLE, no weakness.   RLE warm, able to move. Less swollen then left (but more than usual per patietn)   Skin: Skin is warm and dry. She is not diaphoretic.  Psychiatric: She has a normal mood and affect.  Nursing note and vitals reviewed.    ED Treatments / Results  Labs (all labs ordered are listed, but only abnormal results are displayed) Labs Reviewed  PROTIME-INR  APTT  CBC  DIFFERENTIAL  COMPREHENSIVE METABOLIC PANEL  I-STAT TROPONIN, ED  I-STAT CHEM 8, ED  CBG MONITORING, ED    EKG  EKG Interpretation None       Radiology No results found.  Procedures Procedures (including critical care time)  Medications Ordered in ED Medications - No data to display   Initial Impression / Assessment and Plan / ED Course  I have reviewed the triage vital signs and the nursing notes.  Pertinent labs & imaging results that were available during my care of the patient were reviewed by me and considered in my medical  decision making (see chart for details).     Patient is a 71 year old female presenting with numbness to right lower extremity.  Patient reports she is in her normal state of health and then all of a sudden 11 AM started feeling numb from her knee down.  She reports a months amount of pain in the back part of her right knee.  Patient called code stroke from triage.  Nursing reports that she is very unstable on her feet when trying to pivot.  Otherwise no neurological symptoms.  She has no weakness in the right lower extremity.  PMH previous right middle cerebral artery stroke in 2012 treated with TPA and thrombectomy, positive for lupus anticoagulant,  history of pulmonary embolus status post IVC filter, heparin induced thrombocytopenia, previous DVT, atrial thrombosis, sp thrombectomy by Dr. Donnetta Hutching and diastolic heart failure  9:48 PM Stroke stopped  Patient had DVT study ordered given that her numbness is isolated to the right lower extremity below knee.  Patient felt pain behind the right knee and then numbness.  Pain is also gotten a little bit better.   However given patient's history of arterial thrombus, and difficulty finding distal pulses on either extremities (given likely poor vascular status at baseline) will get CT Angio with runoff bilaterally.     Final Clinical Impressions(s) / ED Diagnoses   Final diagnoses:  None    ED Discharge Orders    None       Macarthur Critchley, MD 02/15/17 1635

## 2017-02-15 NOTE — ED Notes (Signed)
Pt to ultrasound

## 2017-02-15 NOTE — Anesthesia Preprocedure Evaluation (Addendum)
Anesthesia Evaluation  Patient identified by MRN, date of birth, ID band Patient awake    Reviewed: Allergy & Precautions, NPO status , Patient's Chart, lab work & pertinent test results  Airway Mallampati: II  TM Distance: <3 FB Neck ROM: Full    Dental  (+) Partial Upper, Missing, Dental Advisory Given   Pulmonary neg pulmonary ROS,    breath sounds clear to auscultation       Cardiovascular hypertension, Pt. on home beta blockers and Pt. on medications + Peripheral Vascular Disease  + dysrhythmias Atrial Fibrillation  Rhythm:Regular Rate:Normal     Neuro/Psych PSYCHIATRIC DISORDERS Depression CVA    GI/Hepatic negative GI ROS, Neg liver ROS,   Endo/Other  negative endocrine ROS  Renal/GU negative Renal ROS     Musculoskeletal negative musculoskeletal ROS (+)   Abdominal (+) + obese,   Peds  Hematology negative hematology ROS (+)   Anesthesia Other Findings Day of surgery medications reviewed with the patient.  Reproductive/Obstetrics                            Anesthesia Physical Anesthesia Plan  ASA: III and emergent  Anesthesia Plan: General   Post-op Pain Management:    Induction: Intravenous  PONV Risk Score and Plan: 4 or greater and Ondansetron, Dexamethasone and Midazolam  Airway Management Planned: Oral ETT  Additional Equipment:   Intra-op Plan:   Post-operative Plan: Extubation in OR  Informed Consent: I have reviewed the patients History and Physical, chart, labs and discussed the procedure including the risks, benefits and alternatives for the proposed anesthesia with the patient or authorized representative who has indicated his/her understanding and acceptance.   Dental advisory given  Plan Discussed with: CRNA  Anesthesia Plan Comments:         Anesthesia Quick Evaluation

## 2017-02-15 NOTE — ED Notes (Signed)
Spoke with anesthesiology regarding pt's angiomax infusion. Bolus given, continuous drip may be started per surgeon in surgery.

## 2017-02-15 NOTE — ED Notes (Signed)
Patient states she drove herself to [place] [employer]  And states she was fine went to [employer] and upon gettng back in her car noticed her right leg was numb, felt like it was going to sleep. Gait was unsteady when transferring to bed from wheelchair.

## 2017-02-15 NOTE — ED Notes (Signed)
Physician bedside, states difficulty paplating pt's dorsal pulses bilaterally; pt states she has hx of poor peripheral circulation. Pt has pain behind her right knee, is suspicious of a blood clot despite being on Warfarin.

## 2017-02-15 NOTE — Anesthesia Procedure Notes (Signed)
Procedure Name: Intubation Date/Time: 02/15/2017 10:45 PM Performed by: Suzy Bouchard, CRNA Pre-anesthesia Checklist: Emergency Drugs available, Suction available, Patient being monitored, Timeout performed and Patient identified Patient Re-evaluated:Patient Re-evaluated prior to induction Oxygen Delivery Method: Circle system utilized Preoxygenation: Pre-oxygenation with 100% oxygen Induction Type: IV induction Ventilation: Mask ventilation without difficulty Laryngoscope Size: Miller and 2 Grade View: Grade I Tube type: Oral Tube size: 7.5 mm Number of attempts: 1 Airway Equipment and Method: Stylet Placement Confirmation: ETT inserted through vocal cords under direct vision,  positive ETCO2 and breath sounds checked- equal and bilateral Secured at: 20 cm Tube secured with: Tape Dental Injury: Teeth and Oropharynx as per pre-operative assessment  Comments: Small area of ulceration/bleeding gum upper gum noted after induction and prior to airway instrumentation.

## 2017-02-15 NOTE — Code Documentation (Signed)
71yo female arriving to [facility] via private vehicle at 1314.  Patient went to get her hair done and then went to [employer].  When she attempted to get back in her car at 1100 she noted right leg numbness from her knee down.  Patient presented to the ED where a code stroke was called.  Patient to CT.  Stroke team to the bedside.  Patient with h/o stroke with left sided deficits - NIHSS 2 for mild left nasolabial fold flattening and left leg weakness on exam and is baseline per patient.  Patient continues to reports numbness in her right lower leg from the knee down, sensation is normal above the knee.  Patient with h/o atrial fibrillation, PE and DVT on Coumadin.  Patient reports missing her appointment to get her INR checked on Monday.  INR 1.43 and PT 17.3.  No acute stroke treatment at this time.  Code stroke canceled.  Bedside handoff with ED RN Claiborne Billings.

## 2017-02-15 NOTE — ED Triage Notes (Signed)
Pt states she woke up this morning and felt normal, she began having right leg numbness around 1100. Pt does have hx of stroke. AO X4 otherwise.

## 2017-02-15 NOTE — ED Notes (Signed)
Code Stroke page went out at 1333.

## 2017-02-15 NOTE — Progress Notes (Signed)
*  PRELIMINARY RESULTS* Vascular Ultrasound Right lower extremity venous duplex has been completed.  Preliminary findings: No evidence of deep vein thrombosis or baker's cyst in the right lower extremity.  Moderate amount of interstitial fluid noted throughout the calf.   Everrett Coombe 02/15/2017, 3:55 PM

## 2017-02-15 NOTE — Consult Note (Signed)
Referring Physician: Dr Thomasene Lot    Chief Complaint: RLE numbness  HPI: [name] is a 71 y.o. female with a complicated past medical history including atrial fibrillation on warfarin (followed by Dr. Percival Spanish), a previous right middle cerebral artery stroke in 2012 treated with TPA and thrombectomy, positive for lupus anticoagulant, history of pulmonary embolus status post IVC filter, heparin induced thrombocytopenia, previous DVT, atrial thrombosis, and diastolic heart failure. The patient was in her normal state of health today, she had just finished at the [employer] and was going to get some lunch when her right lower extremity suddenly became numb from the knee down. This occurred at 11 AM. She was having difficulty walking. There was concern that she was having a stroke and her family brought her to the emergency department. The initial CT was negative for acute findings. It was noted that her INR was 1.46. The code stroke was canceled.   Date last known well: Date: 02/15/2018 Time last known well: Time: 11:00 tPA Given: No: The patient is on warfarin and she did not clinically appear to be having a stroke.  Past Medical History:  Diagnosis Date  . ACUT VENUS EMBO&THROMB DEEP VES PROX LOWR EXTREM    Positive lupus anticoagulant  . Acute right MCA stroke ([place]) 06/04/10   Treated with TPA and thrombectomy with hemorrhagic transformation; Carotids 3/12 negative for ICA stenosis  . Atrial fibrillation (HCC)    On chronic Coumadin  . Atrial thrombosis   . Depression   . Diastolic heart failure    Echo 3/12: EF 55-60%; mild LVH, mild AI, mild MR, moderate to severe LAE, moderate RAE, PASP 44  . HIT (heparin-induced thrombocytopenia) ([place])   . HTN (hypertension)   . Obesity, unspecified   . PULMONARY EMBOLISM    History of IVC filter     Past Surgical History:  Procedure Laterality Date  . distal radius fracture. (12,/16/2010)    . IVC Placement 08/30/2005    . Open reduction  and internal fixation of left intra-articular      Family History  Problem Relation Age of Onset  . Coronary artery disease Unknown   . Diabetes Unknown    Social History:  reports that  has never smoked. she has never used smokeless tobacco. She reports that she does not drink alcohol or use drugs.  Allergies:  Allergies  Allergen Reactions  . Heparin     Medications: No current facility-administered medications for this encounter.   Current Outpatient Medications:  .  aspirin 81 MG tablet, Take 81 mg by mouth daily.  , Disp: , Rfl:  .  CARTIA XT 300 MG 24 hr capsule, TAKE 1 CAPSULE BY MOUTH ONCE DAILY, Disp: 90 capsule, Rfl: 0 .  citalopram (CELEXA) 10 MG tablet, Take 10 mg by mouth daily.  , Disp: , Rfl:  .  docusate sodium (COLACE) 100 MG capsule, Take 100 mg by mouth daily.  , Disp: , Rfl:  .  FLUoxetine (PROZAC) 10 MG capsule, Take 10 mg by mouth daily. , Disp: , Rfl:  .  furosemide (LASIX) 40 MG tablet, Take 1 tablet (40 mg total) by mouth daily. TAKE ONE TABLET BY MOUTH ONCE DAILY. NEED OFFICE VISIT FOR ADDITIONAL REFILLS., Disp: 30 tablet, Rfl: 6 .  KLOR-CON M10 10 MEQ tablet, TAKE ONE TABLET BY MOUTH ONCE DAILY, Disp: 30 tablet, Rfl: 6 .  metoprolol tartrate (LOPRESSOR) 25 MG tablet, Take 0.5 tablets (12.5 mg total) by mouth 2 (two) times daily.  KEEP OV., Disp: 90 tablet, Rfl: 0 .  potassium chloride (K-DUR) 10 MEQ tablet, Take 1 tablet (10 mEq total) by mouth daily., Disp: 30 tablet, Rfl: 11 .  warfarin (COUMADIN) 3 MG tablet, Take 1.5 to 2 tablets by mouth once daily as directed - needs INR and MD appt for further refills, Disp: 60 tablet, Rfl: 0   ROS: History obtained from the patient and and family  General ROS: negative for - chills, fatigue, fever, night sweats, weight gain or weight loss Psychological ROS: negative for - behavioral disorder, hallucinations, memory difficulties, mood swings or suicidal ideation Ophthalmic ROS: negative for - blurry vision, double  vision, eye pain or loss of vision ENT ROS: negative for - epistaxis, nasal discharge, oral lesions, sore throat, tinnitus or vertigo Allergy and Immunology ROS: negative for - hives or itchy/watery eyes Hematological and Lymphatic ROS: negative for - bleeding problems, bruising or swollen lymph nodes Endocrine ROS: negative for - galactorrhea, hair pattern changes, polydipsia/polyuria or temperature intolerance Respiratory ROS: negative for - cough, hemoptysis, shortness of breath or wheezing Cardiovascular ROS: negative for - chest pain, dyspnea on exertion, edema or irregular heartbeat Gastrointestinal ROS: negative for - abdominal pain, diarrhea, hematemesis, nausea/vomiting or stool incontinence Genito-Urinary ROS: negative for - dysuria, hematuria, incontinence or urinary frequency/urgency Musculoskeletal ROS: negative for - joint swelling or muscular weakness. Positive for RLE pain.  Neurological ROS: as noted in HPI Dermatological ROS: negative for rash and skin lesion changes   Physical Examination: Blood pressure (!) 185/120, pulse 89, temperature 97.7 F (36.5 C), temperature source Oral, resp. rate 18, height 5\' 6"  (1.676 m), weight 289 lb (131.1 kg), SpO2 98 %.   Neurologic Examination:  Mental Status:  Alert, oriented, thought content appropriate. Speech without evidence of dysarthria or aphasia. Able to follow 3 step commands without difficulty.  Cranial Nerves:  II-bilateral visual fields intact III/IV/VI-Pupils were equal and reacted. Extraocular movements were full.  V/VII-no facial numbness. Mild left lower extremity facial droop from previous stroke. VIII-hearing normal.  X-normal speech and symmetrical palatal movement.  XII-midline tongue extension  Motor: 5/5 strength in all 4 extremities with the exception of inability to provide resistance > 4/5 to knee extension and ankle dorsi/plantar flexion on the right in the context of RLE pain.  Muscle tone normal  throughout. Sensory: Intact to light touch and temp in all extremities, except for impaired sensation to RLE circumferentially below the knee. Deep Tendon Reflexes:  Hypoactive x 4 in the context of distal lower extremity chronic edematous changes and morbid obesity.  Plantars: Downgoing bilaterally  Cerebellar: Normal finger to nose bilaterally. Gait: not tested  Laboratory Studies:  Basic Metabolic Panel: Recent Labs  Lab 02/15/17 1350  NA 139  K 3.9  CL 102  GLUCOSE 141*  BUN 8  CREATININE 0.70    Liver Function Tests: No results for input(s): AST, ALT, ALKPHOS, BILITOT, PROT, ALBUMIN in the last 168 hours. No results for input(s): LIPASE, AMYLASE in the last 168 hours. No results for input(s): AMMONIA in the last 168 hours.  CBC: Recent Labs  Lab 02/15/17 1337 02/15/17 1350  WBC 8.2  --   NEUTROABS 6.5  --   HGB 14.0 14.6  HCT 42.4 43.0  MCV 99.1  --   PLT 168  --     Cardiac Enzymes: No results for input(s): CKTOTAL, CKMB, CKMBINDEX, TROPONINI in the last 168 hours.  BNP: Invalid input(s): POCBNP  CBG: No results for input(s): GLUCAP in the last 168  hours.  Microbiology: Results for orders placed or performed during the hospital encounter of 06/09/10  Urine culture     Status: None   Collection Time: 06/10/10  9:38 PM  Result Value Ref Range Status   Specimen Description URINE, CLEAN CATCH  Final   Special Requests NONE  Final   Culture  Setup Time [id number]  Final   Colony Count >=100,000 COLONIES/ML  Final   Culture ENTEROBACTER CLOACAE ENTEROCOCCUS SPECIES  Final   Report Status 06/14/2010 FINAL  Final   Organism ID, Bacteria ENTEROCOCCUS SPECIES  Final   Organism ID, Bacteria ENTEROBACTER CLOACAE  Final      Susceptibility   Enterobacter cloacae - MIC    CEFAZOLIN >=64 Resistant     CEFTRIAXONE >=64 Resistant     CIPROFLOXACIN <=0.25 Sensitive     GENTAMICIN <=1 Sensitive     LEVOFLOXACIN 0.25 Sensitive     NITROFURANTOIN 32 Sensitive      TOBRAMYCIN <=1 Sensitive     TRIMETH/SULFA* <=20 Sensitive      * SET UP TIME:  [id number]   Enterococcus species - MIC    AMPICILLIN <=2 Sensitive     LEVOFLOXACIN 1 Sensitive     NITROFURANTOIN <=16 Sensitive     VANCOMYCIN 2 Sensitive     TETRACYCLINE >=16 Resistant   Urine culture     Status: None   Collection Time: 07/07/10  3:00 PM  Result Value Ref Range Status   Specimen Description URINE, CLEAN CATCH  Final   Special Requests IMMUNE:NORM UT SYMPT:NEG  Final   Culture  Setup Time [id number]  Final   Colony Count >=100,000 COLONIES/ML  Final   Culture ESCHERICHIA COLI  Final   Report Status 07/09/2010 FINAL  Final   Organism ID, Bacteria ESCHERICHIA COLI  Final      Susceptibility   Escherichia coli - MIC    AMPICILLIN 8 Sensitive     CEFAZOLIN <=4 Sensitive     CEFTRIAXONE <=1 Sensitive     CIPROFLOXACIN <=0.25 Sensitive     GENTAMICIN <=1 Sensitive     LEVOFLOXACIN <=0.12 Sensitive     NITROFURANTOIN <=16 Sensitive     TOBRAMYCIN <=1 Sensitive     TRIMETH/SULFA <=20 Sensitive     Coagulation Studies: Recent Labs    02/15/17 1337  LABPROT 17.3*  INR 1.43    Urinalysis: No results for input(s): COLORURINE, LABSPEC, PHURINE, GLUCOSEU, HGBUR, BILIRUBINUR, KETONESUR, PROTEINUR, UROBILINOGEN, NITRITE, LEUKOCYTESUR in the last 168 hours.  Invalid input(s): APPERANCEUR  Lipid Panel:    Component Value Date/Time   CHOL  06/04/2010 0359    139        ATP III CLASSIFICATION:  <200     mg/dL   Desirable  200-239  mg/dL   Borderline High  >=240    mg/dL   High          TRIG 71 06/04/2010 0359   HDL 33 (L) 06/04/2010 0359   CHOLHDL 4.2 06/04/2010 0359   VLDL 14 06/04/2010 0359   LDLCALC  06/04/2010 0359    92        Total Cholesterol/HDL:CHD Risk Coronary Heart Disease Risk Table                     Men   Women  1/2 Average Risk   3.4   3.3  Average Risk       5.0   4.4  2 X Average Risk   9.6   7.1  3 X Average Risk  23.4   11.0        Use the  calculated Patient Ratio above and the CHD Risk Table to determine the patient's CHD Risk.        ATP III CLASSIFICATION (LDL):  <100     mg/dL   Optimal  100-129  mg/dL   Near or Above                    Optimal  130-159  mg/dL   Borderline  160-189  mg/dL   High  >190     mg/dL   Very High    HgbA1C:  Lab Results  Component Value Date   HGBA1C (H) 06/04/2010    6.3 (NOTE)                                                                       According to the ADA Clinical Practice Recommendations for 2011, when HbA1c is used as a screening test:   >=6.5%   Diagnostic of Diabetes Mellitus           (if abnormal result  is confirmed)  5.7-6.4%   Increased risk of developing Diabetes Mellitus  References:Diagnosis and Classification of Diabetes Mellitus,Diabetes WJXB,1478,29(FAOZH 1):S62-S69 and Standards of Medical Care in         Diabetes - 2011,Diabetes Care,2011,34  (Suppl 1):S11-S61.    Urine Drug Screen:  No results found for: LABOPIA, COCAINSCRNUR, LABBENZ, AMPHETMU, THCU, LABBARB  Alcohol Level: No results for input(s): ETH in the last 168 hours.  Other results: EKG: pending   Imaging:  Ct Head Code Stroke Wo Contrast 02/15/2017 IMPRESSION:  1. No acute cortically based infarct or acute intracranial hemorrhage.  2. ASPECTS is 10.  3. Chronic hemorrhagic infarct of the right basal ganglia. Small chronic lacune in the right cerebellum.     Assessment: 71 y.o. female with multiple risk factors for stroke including previous strokes, atrial fibrillation, and subtherapeutic INR presents to the emergency department with sudden onset of right lower extremity numbness from the knee down. The patient denied weakness in the extremity. Some posterior upper calf pain was noted on the right. Head CT as noted above. The code stroke was canceled.  Stroke Risk Factors - atrial fibrillation on warfarin with subtherapeutic INR, previous strokes, positive lupus anticoagulant, history of  atrial thrombosis, hypertension, and obesity.  Plan:  Rule out DVT or other vascular etiology for right lower extremity numbness.  HgbA1c, fasting lipid panel  Consider PT consult, OT consult  Prophylactic therapy-continue warfarin and aspirin  NPO until RN stroke swallow screen  Telemetry monitoring  Lowry Ram [facility] Pager [phone] 02/15/2017, 2:34 PM  The patient was seen and examined. Assessment and plan discussed with Mikey Bussing PA-C Electronically signed: Dr. Kerney Elbe

## 2017-02-15 NOTE — H&P (Addendum)
Referring Physician: Dr Tylene Fantasia [facility]  Patient name: [name] MRN: [id number] DOB: 1945-08-22 Sex: female  REASON FOR CONSULT: right leg ischemia  HPI: [name] is a 71 y.o. female fairly acute onset right foot numbness around 11 am today.  She has prior history of afib with prior embolic events.  She has had one prior embolic stroke 3557.  She had bilateral femoral embolectomies in 2012.  She also has prior h/o DVT and has an IVC filter.  She also has lupus anticoagulant.  Her INR has been subtherapeutic for several days.  Her primary MD has been trying to titrate her dose.  Other medical problems include hypertension.  She states she has an allergy to heparin with hives.  She has HIT in her chart but does not recall this issue.  She denies any abdominal pain or pain or numbness in her other extremities.  She states she is able to move the right foot and the numbness has improved.  Past Medical History:  Diagnosis Date  . ACUT VENUS EMBO&THROMB DEEP VES PROX LOWR EXTREM    Positive lupus anticoagulant  . Acute right MCA stroke ([place]) 06/04/10   Treated with TPA and thrombectomy with hemorrhagic transformation; Carotids 3/12 negative for ICA stenosis  . Atrial fibrillation (HCC)    On chronic Coumadin  . Atrial thrombosis   . Depression   . Diastolic heart failure    Echo 3/12: EF 55-60%; mild LVH, mild AI, mild MR, moderate to severe LAE, moderate RAE, PASP 44  . HIT (heparin-induced thrombocytopenia) ([place])   . HTN (hypertension)   . Obesity, unspecified   . PULMONARY EMBOLISM    History of IVC filter    Past Surgical History:  Procedure Laterality Date  . distal radius fracture. (12,/16/2010)    . IVC Placement 08/30/2005    . Open reduction and internal fixation of left intra-articular      Family History  Problem Relation Age of Onset  . Coronary artery disease Unknown   . Diabetes Unknown     SOCIAL HISTORY: Social History   Socioeconomic History  .  Marital status: Single    Spouse name: Not on file  . Number of children: Not on file  . Years of education: Not on file  . Highest education level: Not on file  Social Needs  . Financial resource strain: Not on file  . Food insecurity - worry: Not on file  . Food insecurity - inability: Not on file  . Transportation needs - medical: Not on file  . Transportation needs - non-medical: Not on file  Occupational History  . Not on file  Tobacco Use  . Smoking status: Never Smoker  . Smokeless tobacco: Never Used  Substance and Sexual Activity  . Alcohol use: No  . Drug use: No  . Sexual activity: Not on file  Other Topics Concern  . Not on file  Social History Narrative    Lives alone.  She works at a [employer].   Daughter in area to assist if needed.  One-level home, 5-6 steps to   entry.  Functional history prior to admission was independent,   functional status upon admission to rehab services was +2, total assist   bed mobility,+2 total assist transfers, ambulation not tested, moderate   assist upper body, total assist lower body, activities of daily living.       Allergies  Allergen Reactions  . Heparin Hives  Current Facility-Administered Medications  Medication Dose Route Frequency Provider Last Rate Last Dose  . bivalirudin (ANGIOMAX) BOLUS via infusion  0.1 mg/kg Intravenous Once Elam Dutch, MD       Current Outpatient Medications  Medication Sig Dispense Refill  . acetaminophen (TYLENOL) 500 MG tablet Take 1,000 mg by mouth every 6 (six) hours as needed for mild pain.    Marland Kitchen aspirin 81 MG tablet Take 81 mg by mouth daily.      Marland Kitchen CARTIA XT 300 MG 24 hr capsule TAKE 1 CAPSULE BY MOUTH ONCE DAILY 90 capsule 0  . cholecalciferol (VITAMIN D) 1000 units tablet Take 1,000 Units by mouth daily.    . furosemide (LASIX) 40 MG tablet Take 1 tablet (40 mg total) by mouth daily. TAKE ONE TABLET BY MOUTH ONCE DAILY. NEED OFFICE VISIT FOR ADDITIONAL REFILLS.  30 tablet 6  . metoprolol tartrate (LOPRESSOR) 25 MG tablet Take 0.5 tablets (12.5 mg total) by mouth 2 (two) times daily. KEEP OV. 90 tablet 0  . warfarin (COUMADIN) 3 MG tablet Take 1.5 to 2 tablets by mouth once daily as directed - needs INR and MD appt for further refills 60 tablet 0  . KLOR-CON M10 10 MEQ tablet TAKE ONE TABLET BY MOUTH ONCE DAILY (Patient not taking: Reported on 02/15/2017) 30 tablet 6  . potassium chloride (K-DUR) 10 MEQ tablet Take 1 tablet (10 mEq total) by mouth daily. 30 tablet 11    ROS:   General:  No weight loss, Fever, chills  HEENT: No recent headaches, no nasal bleeding, no visual changes, no sore throat  Neurologic: No dizziness, blackouts, seizures. No recent symptoms of stroke or mini- stroke. No recent episodes of slurred speech, or temporary blindness.  Cardiac: No recent episodes of chest pain/pressure, no shortness of breath at rest.  + shortness of breath with exertion.  + history of atrial fibrillation or irregular heartbeat  Vascular: No history of rest pain in feet.  No history of claudication.  No history of non-healing ulcer, + history of DVT   Pulmonary: No home oxygen, no productive cough, no hemoptysis,  No asthma or wheezing  Musculoskeletal:  [X]  Arthritis, [X]  Low back pain,  [X]  Joint pain  Hematologic:No history of hypercoagulable state.  No history of easy bleeding.  No history of anemia  Gastrointestinal: No hematochezia or melena,  No gastroesophageal reflux, no trouble swallowing  Urinary: [ ]  chronic Kidney disease, [ ]  on HD - [ ]  MWF or [ ]  TTHS, [ ]  Burning with urination, [x ] Frequent urination, [ ]  Difficulty urinating;   Skin: No rashes  Psychological: No history of anxiety,  + history of depression   Physical Examination  Vitals:   02/15/17 1831 02/15/17 1930 02/15/17 2015 02/15/17 2030  BP: (!) 148/80 (!) 155/88 (!) 155/96 (!) 174/89  Pulse: 80 86 83 84  Resp:  20 20 18   Temp:      TempSrc:      SpO2:  97%  96% 96%  Weight:      Height:        Body mass index is 46.65 kg/m.  General:  Alert and oriented, no acute distress HEENT: Normal Neck: No bruit or JVD Pulmonary: Clear to auscultation bilaterally Cardiac: Regular Rate and Rhythm  Abdomen: Soft, non-tender, non-distended, obese Skin: No rash Extremity Pulses:  2+ radial, brachial, femoral,absent dorsalis pedis, posterior tibial pulses bilaterally Musculoskeletal: No deformity or edema  Neurologic: Upper and lower extremity motor 5/5 and symmetric  DATA:  CTA right leg.  Right popliteal and tibial embolus  Head CT chronic infarcts right brain no acute bleed or infarct  ASSESSMENT:  Acute ischemia right leg.  Subtherapeutic INR most likely cardiac source embolus.  No evidence of embolus mestenteric or cerebral or other extremities currently     PLAN:  1.  Emergent right popliteal embolectomy  2.  Although left leg asymptomatic will need duplex of this post op since she does not have a palpable pulse in the left foot.  Unknown if this is subacute or chronic  Angiomax bolus and infusion stat  Risks benefits possible complications discussed with pt and daughter including but not limited to bleeding infection recurrent embolus limb loss.  She wishes to proceed.     Ruta Hinds, MD [facility] [place] Office: [phone] Pager: [phone]

## 2017-02-15 NOTE — ED Notes (Signed)
Cancelled Code Stroke - via Marden Noble w/ [facility]

## 2017-02-15 NOTE — ED Notes (Signed)
Pt states she did not take her normal metoprolol for BP today, does have the meds with her. Will as MD if home meds may be taken for Bp 179/159

## 2017-02-16 ENCOUNTER — Encounter (HOSPITAL_COMMUNITY): Payer: Self-pay | Admitting: *Deleted

## 2017-02-16 ENCOUNTER — Other Ambulatory Visit: Payer: Self-pay

## 2017-02-16 DIAGNOSIS — D6862 Lupus anticoagulant syndrome: Secondary | ICD-10-CM

## 2017-02-16 DIAGNOSIS — R001 Bradycardia, unspecified: Secondary | ICD-10-CM | POA: Diagnosis not present

## 2017-02-16 DIAGNOSIS — Z888 Allergy status to other drugs, medicaments and biological substances status: Secondary | ICD-10-CM | POA: Diagnosis not present

## 2017-02-16 DIAGNOSIS — Z86718 Personal history of other venous thrombosis and embolism: Secondary | ICD-10-CM | POA: Diagnosis not present

## 2017-02-16 DIAGNOSIS — I10 Essential (primary) hypertension: Secondary | ICD-10-CM | POA: Diagnosis not present

## 2017-02-16 DIAGNOSIS — Z6841 Body Mass Index (BMI) 40.0 and over, adult: Secondary | ICD-10-CM | POA: Diagnosis not present

## 2017-02-16 DIAGNOSIS — F329 Major depressive disorder, single episode, unspecified: Secondary | ICD-10-CM | POA: Diagnosis not present

## 2017-02-16 DIAGNOSIS — Z7901 Long term (current) use of anticoagulants: Secondary | ICD-10-CM | POA: Diagnosis not present

## 2017-02-16 DIAGNOSIS — I481 Persistent atrial fibrillation: Secondary | ICD-10-CM | POA: Diagnosis not present

## 2017-02-16 DIAGNOSIS — I743 Embolism and thrombosis of arteries of the lower extremities: Secondary | ICD-10-CM

## 2017-02-16 DIAGNOSIS — I998 Other disorder of circulatory system: Secondary | ICD-10-CM | POA: Diagnosis not present

## 2017-02-16 DIAGNOSIS — D6861 Antiphospholipid syndrome: Secondary | ICD-10-CM | POA: Diagnosis not present

## 2017-02-16 DIAGNOSIS — Z23 Encounter for immunization: Secondary | ICD-10-CM | POA: Diagnosis not present

## 2017-02-16 DIAGNOSIS — I482 Chronic atrial fibrillation: Secondary | ICD-10-CM | POA: Diagnosis not present

## 2017-02-16 DIAGNOSIS — I82431 Acute embolism and thrombosis of right popliteal vein: Secondary | ICD-10-CM | POA: Diagnosis not present

## 2017-02-16 DIAGNOSIS — I110 Hypertensive heart disease with heart failure: Secondary | ICD-10-CM | POA: Diagnosis not present

## 2017-02-16 DIAGNOSIS — Z8673 Personal history of transient ischemic attack (TIA), and cerebral infarction without residual deficits: Secondary | ICD-10-CM | POA: Diagnosis not present

## 2017-02-16 DIAGNOSIS — I5030 Unspecified diastolic (congestive) heart failure: Secondary | ICD-10-CM | POA: Diagnosis not present

## 2017-02-16 LAB — CBC
HCT: 42.8 % (ref 36.0–46.0)
Hemoglobin: 14.1 g/dL (ref 12.0–15.0)
MCH: 32.8 pg (ref 26.0–34.0)
MCHC: 32.9 g/dL (ref 30.0–36.0)
MCV: 99.5 fL (ref 78.0–100.0)
PLATELETS: 162 10*3/uL (ref 150–400)
RBC: 4.3 MIL/uL (ref 3.87–5.11)
RDW: 13.4 % (ref 11.5–15.5)
WBC: 9 10*3/uL (ref 4.0–10.5)

## 2017-02-16 LAB — APTT
APTT: 185 s — AB (ref 24–36)
aPTT: 71 seconds — ABNORMAL HIGH (ref 24–36)
aPTT: 79 seconds — ABNORMAL HIGH (ref 24–36)

## 2017-02-16 MED ORDER — DOCUSATE SODIUM 100 MG PO CAPS
100.0000 mg | ORAL_CAPSULE | Freq: Every day | ORAL | Status: DC
  Administered 2017-02-17 – 2017-02-20 (×4): 100 mg via ORAL
  Filled 2017-02-16 (×5): qty 1

## 2017-02-16 MED ORDER — PHENOL 1.4 % MT LIQD: 1.0000 | OROMUCOSAL | Status: DC | PRN

## 2017-02-16 MED ORDER — HYDRALAZINE HCL 20 MG/ML IJ SOLN: 5.0000 mg | INTRAMUSCULAR | Status: DC | PRN

## 2017-02-16 MED ORDER — PNEUMOCOCCAL VAC POLYVALENT 25 MCG/0.5ML IJ INJ
0.5000 mL | INJECTION | INTRAMUSCULAR | Status: AC
  Administered 2017-02-18: 0.5 mL via INTRAMUSCULAR
  Filled 2017-02-16: qty 0.5

## 2017-02-16 MED ORDER — FUROSEMIDE 40 MG PO TABS
40.0000 mg | ORAL_TABLET | Freq: Every day | ORAL | Status: DC
  Administered 2017-02-16 – 2017-02-21 (×6): 40 mg via ORAL
  Filled 2017-02-16 (×6): qty 1

## 2017-02-16 MED ORDER — SODIUM CHLORIDE 0.9 % IV SOLN: 500.0000 mL | Freq: Once | INTRAVENOUS | Status: DC | PRN

## 2017-02-16 MED ORDER — POTASSIUM CHLORIDE CRYS ER 10 MEQ PO TBCR
10.0000 meq | EXTENDED_RELEASE_TABLET | Freq: Every day | ORAL | Status: DC
  Administered 2017-02-16 – 2017-02-21 (×6): 10 meq via ORAL
  Filled 2017-02-16 (×12): qty 1

## 2017-02-16 MED ORDER — INFLUENZA VAC SPLIT HIGH-DOSE 0.5 ML IM SUSY
0.5000 mL | PREFILLED_SYRINGE | INTRAMUSCULAR | Status: AC
  Filled 2017-02-16: qty 0.5

## 2017-02-16 MED ORDER — PROMETHAZINE HCL 25 MG/ML IJ SOLN: 6.2500 mg | INTRAMUSCULAR | Status: DC | PRN

## 2017-02-16 MED ORDER — NEOSTIGMINE METHYLSULFATE 10 MG/10ML IV SOLN
INTRAVENOUS | Status: DC | PRN
  Administered 2017-02-16: 3 mg via INTRAVENOUS

## 2017-02-16 MED ORDER — LIDOCAINE 2% (20 MG/ML) 5 ML SYRINGE
INTRAMUSCULAR | Status: AC
Start: 2017-02-16 — End: 2017-02-16
  Filled 2017-02-16: qty 5

## 2017-02-16 MED ORDER — DEXTROSE 5 % IV SOLN
1.5000 g | Freq: Two times a day (BID) | INTRAVENOUS | Status: AC
  Administered 2017-02-16 (×2): 1.5 g via INTRAVENOUS
  Filled 2017-02-16 (×2): qty 1.5

## 2017-02-16 MED ORDER — METOPROLOL TARTRATE 5 MG/5ML IV SOLN: 2.0000 mg | INTRAVENOUS | Status: DC | PRN

## 2017-02-16 MED ORDER — PANTOPRAZOLE SODIUM 40 MG PO TBEC
40.0000 mg | DELAYED_RELEASE_TABLET | Freq: Every day | ORAL | Status: DC
  Administered 2017-02-16 – 2017-02-21 (×6): 40 mg via ORAL
  Filled 2017-02-16 (×6): qty 1

## 2017-02-16 MED ORDER — POTASSIUM CHLORIDE CRYS ER 20 MEQ PO TBCR: 20.0000 meq | EXTENDED_RELEASE_TABLET | Freq: Every day | ORAL | Status: DC | PRN

## 2017-02-16 MED ORDER — ALUM & MAG HYDROXIDE-SIMETH 200-200-20 MG/5ML PO SUSP: 15.0000 mL | ORAL | Status: DC | PRN

## 2017-02-16 MED ORDER — MORPHINE SULFATE (PF) 2 MG/ML IV SOLN: 2.0000 mg | INTRAVENOUS | Status: DC | PRN

## 2017-02-16 MED ORDER — LABETALOL HCL 5 MG/ML IV SOLN: 10.0000 mg | INTRAVENOUS | Status: DC | PRN

## 2017-02-16 MED ORDER — MEPERIDINE HCL 25 MG/ML IJ SOLN: 6.2500 mg | INTRAMUSCULAR | Status: DC | PRN

## 2017-02-16 MED ORDER — KCL IN DEXTROSE-NACL 20-5-0.45 MEQ/L-%-% IV SOLN
INTRAVENOUS | Status: DC
  Administered 2017-02-16: 03:00:00 via INTRAVENOUS
  Filled 2017-02-16 (×3): qty 1000

## 2017-02-16 MED ORDER — ASPIRIN 81 MG PO CHEW
81.0000 mg | CHEWABLE_TABLET | Freq: Every day | ORAL | Status: DC
  Administered 2017-02-16 – 2017-02-21 (×6): 81 mg via ORAL
  Filled 2017-02-16 (×6): qty 1

## 2017-02-16 MED ORDER — ROCURONIUM BROMIDE 10 MG/ML (PF) SYRINGE
PREFILLED_SYRINGE | INTRAVENOUS | Status: AC
  Filled 2017-02-16: qty 5

## 2017-02-16 MED ORDER — OXYCODONE HCL 5 MG PO TABS: 5.0000 mg | ORAL_TABLET | ORAL | Status: DC | PRN

## 2017-02-16 MED ORDER — MAGNESIUM SULFATE 2 GM/50ML IV SOLN
2.0000 g | Freq: Every day | INTRAVENOUS | Status: DC | PRN
  Filled 2017-02-16: qty 50

## 2017-02-16 MED ORDER — DILTIAZEM HCL ER COATED BEADS 180 MG PO CP24
300.0000 mg | ORAL_CAPSULE | Freq: Every day | ORAL | Status: DC
  Administered 2017-02-16 – 2017-02-21 (×6): 300 mg via ORAL
  Filled 2017-02-16 (×5): qty 1

## 2017-02-16 MED ORDER — DEXAMETHASONE SODIUM PHOSPHATE 10 MG/ML IJ SOLN
INTRAMUSCULAR | Status: AC
  Filled 2017-02-16: qty 1

## 2017-02-16 MED ORDER — WARFARIN - PHARMACIST DOSING INPATIENT
Freq: Every day | Status: DC
  Administered 2017-02-20: 17:00:00

## 2017-02-16 MED ORDER — ONDANSETRON HCL 4 MG/2ML IJ SOLN
INTRAMUSCULAR | Status: AC
Start: 2017-02-16 — End: 2017-02-16
  Filled 2017-02-16: qty 2

## 2017-02-16 MED ORDER — ONDANSETRON HCL 4 MG/2ML IJ SOLN
INTRAMUSCULAR | Status: DC | PRN
  Administered 2017-02-16: 4 mg via INTRAVENOUS

## 2017-02-16 MED ORDER — GLYCOPYRROLATE 0.2 MG/ML IJ SOLN
INTRAMUSCULAR | Status: DC | PRN
  Administered 2017-02-16: 0.4 mg via INTRAVENOUS

## 2017-02-16 MED ORDER — NEOSTIGMINE METHYLSULFATE 5 MG/5ML IV SOSY
PREFILLED_SYRINGE | INTRAVENOUS | Status: AC
  Filled 2017-02-16: qty 5

## 2017-02-16 MED ORDER — ACETAMINOPHEN 650 MG RE SUPP: 325.0000 mg | RECTAL | Status: DC | PRN

## 2017-02-16 MED ORDER — ONDANSETRON HCL 4 MG/2ML IJ SOLN
4.0000 mg | Freq: Four times a day (QID) | INTRAMUSCULAR | Status: DC | PRN
Start: 2017-02-16 — End: 2017-02-21

## 2017-02-16 MED ORDER — METOPROLOL TARTRATE 12.5 MG HALF TABLET
12.5000 mg | ORAL_TABLET | Freq: Two times a day (BID) | ORAL | Status: DC
  Administered 2017-02-16 – 2017-02-21 (×11): 12.5 mg via ORAL
  Filled 2017-02-16 (×11): qty 1

## 2017-02-16 MED ORDER — VITAMIN D 1000 UNITS PO TABS
1000.0000 [IU] | ORAL_TABLET | Freq: Every day | ORAL | Status: DC
  Administered 2017-02-16 – 2017-02-21 (×6): 1000 [IU] via ORAL
  Filled 2017-02-16 (×6): qty 1

## 2017-02-16 MED ORDER — FENTANYL CITRATE (PF) 100 MCG/2ML IJ SOLN: 25.0000 ug | INTRAMUSCULAR | Status: DC | PRN

## 2017-02-16 MED ORDER — GUAIFENESIN-DM 100-10 MG/5ML PO SYRP: 15.0000 mL | ORAL_SOLUTION | ORAL | Status: DC | PRN

## 2017-02-16 MED ORDER — ACETAMINOPHEN 325 MG PO TABS: 325.0000 mg | ORAL_TABLET | ORAL | Status: DC | PRN

## 2017-02-16 MED ORDER — LACTATED RINGERS IV SOLN: INTRAVENOUS | Status: DC

## 2017-02-16 MED ORDER — SUCCINYLCHOLINE CHLORIDE 200 MG/10ML IV SOSY
PREFILLED_SYRINGE | INTRAVENOUS | Status: AC
  Filled 2017-02-16: qty 10

## 2017-02-16 MED ORDER — WARFARIN SODIUM 3 MG PO TABS
6.0000 mg | ORAL_TABLET | Freq: Once | ORAL | Status: AC
  Administered 2017-02-16: 6 mg via ORAL
  Filled 2017-02-16: qty 2

## 2017-02-16 NOTE — Transfer of Care (Signed)
Immediate Anesthesia Transfer of Care Note  Patient: [name] [place]  Procedure(s) Performed: EMBOLECTOMY RIGHT LEG (Right Leg Lower)  Patient Location: PACU  Anesthesia Type:General  Level of Consciousness: awake and alert   Airway & Oxygen Therapy: Patient Spontanous Breathing and Patient connected to nasal cannula oxygen  Post-op Assessment: Report given to RN and Post -op Vital signs reviewed and stable  Post vital signs: Reviewed and stable  Last Vitals:  Vitals:   02/15/17 2030 02/16/17 0134  BP: (!) 174/89 (!) 105/91  Pulse: 84 82  Resp: 18 18  Temp:  36.8 C  SpO2: 96% 98%    Last Pain:  Vitals:   02/15/17 1325  TempSrc: Oral  PainSc: 6          Complications: No apparent anesthesia complications

## 2017-02-16 NOTE — Op Note (Signed)
Procedure: Right popliteal embolectomy  Preoperative diagnosis: Right popliteal embolus new  Postoperative diagnosis: Same  Anesthesia: Gen.  Assistant: Linus Orn SA  Operative findings: #1 acute embolic material right popliteal artery  Specimens: Right popliteal embolus  Operative details: After obtaining informed consent, the patient was taken the operating room. The patient was placed in supine position operating table. After induction of general anesthesia and endotracheal intubation, the patient's entire right lower extremity was prepped and draped in usual sterile fashion next a longitudinal incision was made on the medial aspect of the right leg. On the saphenous tissues down level of the greater saphenous vein. Several side branches were ligated and divided between silk ties to reflect the vein posteriorly. The fascia was opened and the popliteal space was entered. Dissection was fairly tedious due to the patient's obesity. I was able to mobilize the popliteal vein and reflect this posteriorly. The popliteal artery was dissected free circumferentially. Again due to the depth of the patient's leg was very difficult to dissect out the popliteal artery several centimeters were exposed. I was unable to easily dissect out the origins of the tibial vessels. The wound was very deep and I did not feel that I would be able to safely pass a Fogarty catheter down the tibial origins anyway so I only exposed the below-knee popliteal artery.  The patient had arrived in the operating room on an Angiomax drip. This was continued. A transverse arteriotomy was made in the mid popliteal artery. There was embolus material within the artery. This was removed under direct vision. I then proceeded to pass a #4 Fogarty catheter proximally up the artery several passes and a large amount of metabolic material was removed. There was then excellent arterial inflow. The artery was controlled proximally with a Henley  clamp. I then passed a #3 Fogarty catheter several times down the distal popliteal artery and again a large amount of embolic material was removed and there was almost pulsatile backbleeding. This was controlled distally with a vessel loop. The arteriotomy was then repaired with several interrupted 7-0 Prolene sutures. At completion of the anastomosis the Henley clamp and the vessel loop were released. There was pulsatile flow in the popliteal artery immediately. Hemostasis was obtained. A small retractor injury to the popliteal vein was repaired with running 5-0 Prolene suture. The patient had a posterior tibial and dorsalis pedis Doppler signal at this point. The patient also had a posterior tibial and dorsalis pedis Doppler signal in the contralateral foot. These were fairly symmetric. Hemostasis was obtained. The subcutaneous tissues were reapproximated using a running 3-0 Vicryl suture. The skin was closed staples. The patient our the procedure well and there were no complications. The instrument sponge and needle count was correct at the end of the case. The patient was taken to the recovery room in stable condition.  Ruta Hinds, MD [facility] [place] Office: [phone] Pager: [phone]

## 2017-02-16 NOTE — OR Nursing (Signed)
Handoff report received as patient arrived to the PACU.  Medical devices attached and patient assessed.  Angiomax infusion 250mg /28ml infusing at the ordered dose of 0.25mg /kg/hr at 6.6 cc/hr.

## 2017-02-16 NOTE — Consult Note (Signed)
Cardiology Consult    Patient ID: [name] MRN: [id number], DOB/AGE: [birth year]   Admit date: 02/15/2017 Date of Consult: 02/16/2017  Primary Physician: Josetta Huddle, MD Primary Cardiologist: J. Hochrein, MD  Requesting Provider: C. Fields, MD  Patient Profile    [name] is a 71 y.o. 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 Oneida Alar.  Past Medical History   Past Medical History:  Diagnosis Date  . (HFpEF) heart failure with preserved ejection fraction ([place])    a. 06/2008 Echo: EF 55-60%, mild LVH, triv AI, mild to mod MS, mod to sev dil LA, mildly dil RA.  Marland Kitchen Acute right MCA stroke ([place])    a. 04/06/5091 Embolic stroke treated with TPA and thrombectomy with hemorrhagic transformation; Carotids 3/12 negative for ICA stenosis.  . Bilateral Pulmonary Emboli    a. 08/2005 - s/p IVC filter.  . Depression   . Embolus of femoral artery ([place])    a. 06/2008 s/p bilateral embolectomies.  . History of DVT (deep vein thrombosis)    a. s/p IVC filter.  Marland Kitchen HIT (heparin-induced thrombocytopenia) ([place])   . HTN (hypertension)   . Lupus anticoagulant disorder ([place])   . Obesity, unspecified   . Permanent atrial fibrillation ([place])    a. On chronic Coumadin - INRs followed by PCP; b. CHA2DS2VASc = 5.  . Popliteal artery embolism, right ([place])    a. 01/2017 in the setting of subRx INR-->s/p embolectomy.    Past Surgical History:  Procedure Laterality Date  . distal radius fracture. (12,/16/2010)    . IVC Placement 08/30/2005    . Open reduction and internal fixation of left intra-articular       Allergies  Allergies  Allergen Reactions  . Heparin Hives    History of  Present Illness    71 y/o ? with the above complex PMH including permanent Afib s/p remote DCCV with subsequent reversion to afib.  She has been rate controlled over the years on oral dilt and  blocker.  She is chronically anticoagulated on coumadin.  In the setting of Afib and also + lupus anticoagulant (detected 06/2008), she has also had multiple embolic events including DVT and bilat PE in 08/2005 (s/p IVC filter), bilat femoral artery emboli s/p bilat embolectomies in 04/6710, and embolic CVA in 06/5807 requiring TPA with subsequent hemorrhagic conversion.  She was last seen in our clinic in 2015.  She had been followed in our coumadin clinic until 10/2016, but her PCP has been following her INR since then.  She lives locally and still works full time.  She is relatively active, though does not routinely exercise.  She is compliant with her medications but did miss an INR check last week.  She was in her USOH until the morning of 11/21, when she noted sudden onset of numbness and paresthesias in her right lower leg.  This persisted x several hours and by 1PM, she noted right lower leg pain.  She eventually presented to the ED, where she was initially worked up for stroke - Head CT neg for acute changes.  CTA of the RLE revealed occlusion of the popliteal artery @ the level of the knee.  INR was subRx @  1.43.  She was seen by Vasc Surgery and placed on bivalirudin and underwent urgent R popliteal embolectomy.  She feels well post-op.  HR's have been mildly elevated @ rest but she is asymptomatic.    Inpatient Medications    . aspirin  81 mg Oral Daily  . cholecalciferol  1,000 Units Oral Daily  . diltiazem  300 mg Oral Daily  . [START ON 02/17/2017] docusate sodium  100 mg Oral Daily  . furosemide  40 mg Oral Daily  . [START ON 02/17/2017] Influenza vac split quadrivalent PF  0.5 mL Intramuscular Tomorrow-1000  . metoprolol tartrate  12.5 mg Oral BID  . pantoprazole  40 mg Oral Daily  . [START ON  02/17/2017] pneumococcal 23 valent vaccine  0.5 mL Intramuscular Tomorrow-1000  . potassium chloride  10 mEq Oral Daily    Family History    Family History  Problem Relation Age of Onset  . Coronary artery disease Unknown   . Diabetes Unknown     Social History    Social History   Socioeconomic History  . Marital status: Single    Spouse name: Not on file  . Number of children: Not on file  . Years of education: Not on file  . Highest education level: Not on file  Social Needs  . Financial resource strain: Not on file  . Food insecurity - worry: Not on file  . Food insecurity - inability: Not on file  . Transportation needs - medical: Not on file  . Transportation needs - non-medical: Not on file  Occupational History  . Not on file  Tobacco Use  . Smoking status: Never Smoker  . Smokeless tobacco: Never Used  Substance and Sexual Activity  . Alcohol use: No  . Drug use: No  . Sexual activity: Not Currently  Other Topics Concern  . Not on file  Social History Narrative    Lives alone.  She works at a [employer].   Daughter in area to assist if needed.  One-level home, 5-6 steps to   entry.  Functional history prior to admission was independent,   functional status upon admission to rehab services was +2, total assist   bed mobility,+2 total assist transfers, ambulation not tested, moderate   assist upper body, total assist lower body, activities of daily living.        Review of Systems    General:  No chills, fever, night sweats or weight changes.  Cardiovascular:  No chest pain, dyspnea on exertion, edema, orthopnea, palpitations, paroxysmal nocturnal dyspnea. +++ R leg pain and numbness prior to admission. Dermatological: No rash, lesions/masses Respiratory: No cough, dyspnea Urologic: No hematuria, dysuria Abdominal:   No nausea, vomiting, diarrhea, bright red blood per rectum, melena, or hematemesis Neurologic:  No visual changes, wkns,  changes in mental status. All other systems reviewed and are otherwise negative except as noted above.  Physical Exam    Blood pressure (!) 155/86, pulse 85, temperature 97.7 F (36.5 C), temperature source Oral, resp. rate 19, height 5\' 6"  (1.676 m), weight 288 lb 12.8 oz (131 kg), SpO2 99 %.  General: Pleasant, NAD Psych: Normal affect. Neuro: Alert and oriented X 3. Moves all extremities spontaneously. HEENT: Normal  Neck: Supple without bruits or JVD. Lungs:  Resp regular and unlabored, CTA. Heart: IR, IR no s3, s4, or murmurs. Abdomen: Soft, non-tender, non-distended, BS + x 4.  Extremities: No clubbing, cyanosis or edema. DP/PT/Radials 1+ and equal bilaterally.  Labs  Troponin (Point of Care Test) Recent Labs    02/15/17 1348  TROPIPOC 0.00    Lab Results  Component Value Date   WBC 9.0 02/16/2017   HGB 14.1 02/16/2017   HCT 42.8 02/16/2017   MCV 99.5 02/16/2017   PLT 162 02/16/2017    Recent Labs  Lab 02/15/17 1337 02/15/17 1350  NA 137 139  K 3.4* 3.9  CL 104 102  CO2 24  --   BUN 6 8  CREATININE 0.90 0.70  CALCIUM 8.6*  --   PROT 7.6  --   BILITOT 0.8  --   ALKPHOS 97  --   ALT 15  --   AST 21  --   GLUCOSE 143* 141*   Lab Results  Component Value Date   INR 1.43 02/15/2017      Radiology Studies    Ct Angio Low Extrem Right W &/or Wo Contrast  Result Date: 02/15/2017 CLINICAL DATA:  Arterial embolism of the right lower extremity. Right lower extremity numbness onset this morning. EXAM: CT ANGIOGRAPHY LOWER RIGHT EXTREMITY<Procedure Description>CT ANGIOGRAPHY LOWER RIGHT EXTREMITY TECHNIQUE: CTA of the right lower extremity from distal abdominal aorta through the ankle sagittal coronal reformats are provided of the right lower extremity. CONTRAST:  100 cc of Isovue 370 IV COMPARISON:  None. FINDINGS: No abdominal aortic aneurysm or dissection of the included mid to distal abdominal aorta. An adjacent right-sided IVC filter is present. Rounded  hypodensity adjacent to the included right kidney may represent partial volume averaging of a renal cyst or potentially the gallbladder. This measures 4 cm in diameter. There is scattered colonic diverticulosis without acute diverticulitis. Urinary bladder is unremarkable. The uterus and included right adnexa is normal. No significant stenosis or dissection is noted of the right common, internal and external iliac arteries. The common femoral artery is patent without significant stenosis as are the branch vessels. There is however abrupt termination of the popliteal artery at the level of the knee joint with reconstitution of the tibial arteries via thin sural collaterals. Reconstitution of the tibioperoneal trunk is demonstrated with proximal short-segment occlusion of the posterior tibial artery. The dominant runoff to the ankle is to the anterior tibial artery which can be seen to the level of the ankle. The peroneal artery can be visualized to the distal calf and reconstituted posterior tibial artery to the ankle. Review of the MIP images confirms the above findings. IMPRESSION: 1. Occlusion of the popliteal artery at the level of the knee joint for a span of 5 cm believed to be embolic in etiology with short segmental occlusion of the proximal posterior tibial artery within the calf. These results were called by telephone at the time of interpretation on 02/15/2017 at 7:15 pm to Dr. Hazle Coca, who verbally acknowledged these results. 2. Reconstitution of the tibial arteries via sural collaterals with dominant runoff via the anterior tibial artery. Electronically Signed   By: Ashley Royalty M.D.   On: 02/15/2017 19:16   Ct Head Code Stroke Wo Contrast  Result Date: 02/15/2017 CLINICAL DATA:  Code stroke. 71 year old female with right lower extremity numbness at 1100 hours. EXAM: CT HEAD WITHOUT CONTRAST TECHNIQUE: Contiguous axial images were obtained from the base of the skull through the vertex without  intravenous contrast. COMPARISON:  Head CT without contrast 06/11/2010 and earlier. FINDINGS: Brain: Sequelae of hemorrhagic right basal ganglia infarct that occurred in 2012. Chronic encephalomalacia now at that site. Mild ex vacuo enlargement of the right lateral ventricle. Small  chronic appearing infarct in the right cerebellar hemisphere. Mild motion artifact. No midline shift, ventriculomegaly, mass effect, evidence of mass lesion, intracranial hemorrhage or evidence of cortically based acute infarction. Vascular: Calcified atherosclerosis at the skull base. No suspicious intracranial vascular hyperdensity. Skull: No acute osseous abnormality identified. Sinuses/Orbits: stable to improved sinus aeration since 2012. Mastoids are clear. Other: No acute orbit or scalp soft tissue finding. ASPECTS [facility] Stroke Program Early CT Score) - Ganglionic level infarction (caudate, lentiform nuclei, internal capsule, insula, M1-M3 cortex): 7 - Supraganglionic infarction (M4-M6 cortex): 3 Total score (0-10 with 10 being normal): 10 IMPRESSION: 1. No acute cortically based infarct or acute intracranial hemorrhage. 2. ASPECTS is 10. 3. Chronic hemorrhagic infarct of the right basal ganglia. Small chronic lacune in the right cerebellum. 4. The above was relayed via text pager to Dr. Bruce Donath on 02/15/2017 at 14:01 . Electronically Signed   By: Genevie Ann M.D.   On: 02/15/2017 14:01    ECG & Cardiac Imaging    No ECG performed.  Afib on tele - rates 90's to 130's (when up and walking w/ PT)  Assessment & Plan    1.  R Popliteal Embolism:  In setting of permanent Afib and lupus anticoagulant.  She is on chronic coumadin and reports compliance, however, she missed an INR check w/ her PCP last week and INR was subRx @ 1.43 upon admission 11/21.  Now s/p R popliteal embolectomy and bridging with bivalirudin in the setting of prior h/o HIT.  Unfortunately, there is little data @ this time for an oral Xa inhibitor such as  eliquis or xarelto in the setting of lupus anticoagulant. Don't think that TEE will change therapy.  2.  Permanent Afib:  Rate controlled @ home on lopressor 12.5 BID and Dilt CD 300 mg daily.  Both resumed this AM.  Follow rates and titrate meds as necessary.  Cont coumadin per pharmacy with Bival bridge as above.    3.  Lupus Anticoagulant:  Cont coumadin/bival bridge.  4.  Essential HTN:  BP elevated this AM.  Follow with resumption of home meds and titrate as necessary.  Signed, Murray Hodgkins, NP 02/16/2017, 9:19 AM  For questions or updates, please contact   Please consult [email or web address] for contact info under Cardiology/STEMI.   I have seen, examined the patient, and reviewed the above assessment and plan.  Changes to above are made where necessary.  On exam, iRRR.  Comfortable appearing.  Given known afib and lupus anticoagulant disorder, I agree that TEE will not offer additional information.  She has been on ASA 81mg  daily + coumadin chronically.  She did run out of her coumadin last week and missed doses on Monday and Tuesday which could have led to her current arterial embolism.  I would favor restarting coumadin at this time with bivalruden bridge.  Continue ASA 81mg  daily.  Goal INR 2.5-3.5.  Consider hematology consultation as an outpatient.  Very complicated patient with refractory arterial thrombosis despite appropriate anticoagualtion.  A high level of decision making was required for this consultation.  Co Sign: Thompson Grayer, MD 02/16/2017 11:47 AM

## 2017-02-16 NOTE — Evaluation (Signed)
Occupational Therapy Evaluation and Discharge Patient Details Name: [name] MRN: [id number] DOB: 1945/10/18 Today's Date: 02/16/2017    History of Present Illness Pt adm with ischemic RLE. Underwent Right popliteal embolectomy on 02/16/17. PMH - afib, rt cva, dvt, PE, IVC filter   Clinical Impression   PTA Pt independent in ADL and mobility. Works 4 days a week, very independent. Pt is currently Mod A for LB ADL due to managing pain, and min guard for safety with RW. Educated Pt on compensatory strategies, AE for LB ADL and tub/shower transfers. Education complete. Pt does not have any concerns or questions for OT, and OT to sign off at this time as Pt will have assist from daughter and family at DC. Thank you for the opportunity to serve this patient.     Follow Up Recommendations  No OT follow up;Supervision - Intermittent    Equipment Recommendations  None recommended by OT(Pt has appropriate DME)    Recommendations for Other Services       Precautions / Restrictions Precautions Precautions: None Restrictions Weight Bearing Restrictions: No      Mobility Bed Mobility               General bed mobility comments: Pt sitting OOB in recliner when OT entered  Transfers Overall transfer level: Needs assistance Equipment used: Rolling walker (2 wheeled) Transfers: Sit to/from Stand Sit to Stand: Min guard         General transfer comment: Assist for safety    Balance Overall balance assessment: No apparent balance deficits (not formally assessed)                                         ADL either performed or assessed with clinical judgement   ADL Overall ADL's : Needs assistance/impaired Eating/Feeding: Modified independent;Sitting   Grooming: Set up;Sitting Grooming Details (indicate cue type and reason): Pt reports (confirmed by RN) that she has not had any problems with standing and performing grooming Upper Body Bathing:  Sitting;Set up   Lower Body Bathing: Moderate assistance;With caregiver independent assisting;Sitting/lateral leans Lower Body Bathing Details (indicate cue type and reason): educated Pt in long handle sponge to assist with LB Upper Body Dressing : Modified independent   Lower Body Dressing: Moderate assistance;With caregiver independent assisting;Sit to/from stand Lower Body Dressing Details (indicate cue type and reason): verbally educated on AE for LB dressing (grabber/reacher, long handle shoe horn, sock aide) Toilet Transfer: Min guard   Toileting- Clothing Manipulation and Hygiene: Minimal assistance;Sit to/from stand       Functional mobility during ADLs: Min guard;Rolling walker       Vision Patient Visual Report: No change from baseline       Perception     Praxis      Pertinent Vitals/Pain Pain Assessment: 0-10 Pain Score: 2  Pain Location: RLE Pain Descriptors / Indicators: Sore Pain Intervention(s): Monitored during session;Limited activity within patient's tolerance;Repositioned     Hand Dominance Right   Extremity/Trunk Assessment Upper Extremity Assessment Upper Extremity Assessment: Overall WFL for tasks assessed   Lower Extremity Assessment Lower Extremity Assessment: Defer to PT evaluation RLE Deficits / Details: Slight limitation due to post op soreness       Communication Communication Communication: No difficulties   Cognition Arousal/Alertness: Awake/alert Behavior During Therapy: WFL for tasks assessed/performed Overall Cognitive Status: Within Functional Limits for tasks assessed  General Comments  Pt very positive and pleasant throughout session    Exercises     Shoulder Instructions      Home Living Family/patient expects to be discharged to:: Private residence Living Arrangements: Alone(Plans on living with her daughter until she heals) Available Help at Discharge:  Family;Available PRN/intermittently(daughters MIL lives next door and is around too) Type of Home: House Home Access: Stairs to enter [employer] of Steps: 2 Entrance Stairs-Rails: Right Home Layout: Two level;Able to live on main level with bedroom/bathroom     Bathroom Shower/Tub: Teacher, early years/pre: Standard Bathroom Accessibility: Yes How Accessible: Accessible via walker Home Equipment: [place] - 2 wheels;Cane - single point;Shower seat;Grab bars - tub/shower;Hand held shower head          Prior Functioning/Environment Level of Independence: Independent with assistive device(s)        Comments: Occasional use of cane if going somewhere unfamilliar. Works 4 days a week.        OT Problem List: Decreased range of motion;Decreased activity tolerance;Impaired balance (sitting and/or standing);Obesity;Pain      OT Treatment/Interventions:      OT Goals(Current goals can be found in the care plan section) Acute Rehab OT Goals Patient Stated Goal: Return home and to work OT Goal Formulation: With patient Time For Goal Achievement: 03/02/17 Potential to Achieve Goals: Good  OT Frequency:     Barriers to D/C:            Co-evaluation              AM-PAC PT "6 Clicks" Daily Activity     Outcome Measure Help from another person eating meals?: None Help from another person taking care of personal grooming?: None Help from another person toileting, which includes using toliet, bedpan, or urinal?: A Little Help from another person bathing (including washing, rinsing, drying)?: A Little Help from another person to put on and taking off regular upper body clothing?: None Help from another person to put on and taking off regular lower body clothing?: A Little 6 Click Score: 21   End of Session Equipment Utilized During Treatment: Rolling walker Nurse Communication: Mobility status  Activity Tolerance: Patient tolerated treatment  well Patient left: in chair;with call bell/phone within reach;Other (comment)(pure wick still in place)  OT Visit Diagnosis: Other abnormalities of gait and mobility (R26.89);Pain Pain - Right/Left: Right Pain - part of body: Leg                Time: 6606-3016 OT Time Calculation (min): 13 min Charges:  OT General Charges $OT Visit: 1 Visit OT Evaluation $OT Eval Low Complexity: 1 Low G-Codes:     Hulda Humphrey OTR/L [place] 02/16/2017, 4:04 PM

## 2017-02-16 NOTE — Anesthesia Postprocedure Evaluation (Signed)
Anesthesia Post Note  Patient: [name]  Procedure(s) Performed: EMBOLECTOMY RIGHT LEG (Right Leg Lower)     Patient location during evaluation: PACU Anesthesia Type: General Level of consciousness: awake and alert Pain management: pain level controlled Vital Signs Assessment: post-procedure vital signs reviewed and stable Respiratory status: spontaneous breathing, nonlabored ventilation, respiratory function stable and patient connected to nasal cannula oxygen Cardiovascular status: blood pressure returned to baseline and stable Postop Assessment: no apparent nausea or vomiting Anesthetic complications: no    Last Vitals:  Vitals:   02/16/17 0311 02/16/17 0415  BP: (!) 166/92 (!) 155/86  Pulse: 68 85  Resp: 16 19  Temp: 36.5 C   SpO2: 100% 99%    Last Pain:  Vitals:   02/16/17 0311  TempSrc: Oral  PainSc: 0-No pain                 Effie Berkshire

## 2017-02-16 NOTE — Evaluation (Signed)
Physical Therapy Evaluation Patient Details Name: [name] MRN: [id number] DOB: [birth year] Today's Date: 02/16/2017   History of Present Illness  Pt adm with ischemic RLE. Underwent Right popliteal embolectomy on 02/16/17. PMH - afib, rt cva, dvt, PE, IVC filter  Clinical Impression  Pt admitted with above diagnosis and presents to PT with functional limitations due to deficits listed below (See PT problem list). Pt needs skilled PT to maximize independence and safety to allow discharge to home. Expect pt to progress rapidly and don't feel she will need any further PT after dc. Will follow acutely.    Follow Up Recommendations No PT follow up    Equipment Recommendations  None recommended by PT    Recommendations for Other Services       Precautions / Restrictions Precautions Precautions: None Restrictions Weight Bearing Restrictions: No      Mobility  Bed Mobility Overal bed mobility: Needs Assistance Bed Mobility: Supine to Sit     Supine to sit: Min assist     General bed mobility comments: Assist to initiate movement of RLE due to soreness  Transfers Overall transfer level: Needs assistance Equipment used: Rolling walker (2 wheeled) Transfers: Sit to/from Stand Sit to Stand: Min guard         General transfer comment: Assist for safety  Ambulation/Gait Ambulation/Gait assistance: Min guard Ambulation Distance (Feet): 110 Feet Assistive device: Rolling walker (2 wheeled) Gait Pattern/deviations: Step-through pattern;Decreased step length - left;Decreased stance time - right;Antalgic Gait velocity: decr Gait velocity interpretation: Below normal speed for age/gender General Gait Details: Assist for safety. Initially pt with limp on rt which improved as distance incr and it loosened up. HR to 140 with amb. SpO2 97% on RA  Stairs            Wheelchair Mobility    Modified Rankin (Stroke Patients Only)       Balance Overall balance  assessment: No apparent balance deficits (not formally assessed)                                           Pertinent Vitals/Pain Pain Assessment: 0-10 Pain Score: 2  Pain Location: RLE Pain Descriptors / Indicators: Sore Pain Intervention(s): Limited activity within patient's tolerance;Repositioned    Home Living Family/patient expects to be discharged to:: Private residence Living Arrangements: Alone Available Help at Discharge: Family;Available PRN/intermittently Type of Home: House Home Access: Stairs to enter Entrance Stairs-Rails: Right Entrance Stairs-Number of Steps: 2 Home Layout: Two level;Able to live on main level with bedroom/bathroom Home Equipment: Gilford Rile - 2 wheels;Cane - single point      Prior Function Level of Independence: Independent with assistive device(s)         Comments: Occasional use of cane if going somewhere unfamilliar. Works 4 days a week.     Hand Dominance        Extremity/Trunk Assessment   Upper Extremity Assessment Upper Extremity Assessment: Defer to OT evaluation    Lower Extremity Assessment Lower Extremity Assessment: RLE deficits/detail RLE Deficits / Details: Slight limitation due to post op soreness       Communication   Communication: No difficulties  Cognition Arousal/Alertness: Awake/alert Behavior During Therapy: WFL for tasks assessed/performed Overall Cognitive Status: Within Functional Limits for tasks assessed  General Comments      Exercises     Assessment/Plan    PT Assessment Patient needs continued PT services  PT Problem List Decreased mobility;Decreased activity tolerance       PT Treatment Interventions DME instruction;Gait training;Functional mobility training;Therapeutic activities;Therapeutic exercise;Patient/family education    PT Goals (Current goals can be found in the Care Plan section)  Acute Rehab PT  Goals Patient Stated Goal: Return home and to work PT Goal Formulation: With patient Time For Goal Achievement: 02/23/17 Potential to Achieve Goals: Good    Frequency Min 3X/week   Barriers to discharge        Co-evaluation               AM-PAC PT "6 Clicks" Daily Activity  Outcome Measure Difficulty turning over in bed (including adjusting bedclothes, sheets and blankets)?: A Lot Difficulty moving from lying on back to sitting on the side of the bed? : Unable Difficulty sitting down on and standing up from a chair with arms (e.g., wheelchair, bedside commode, etc,.)?: Unable Help needed moving to and from a bed to chair (including a wheelchair)?: A Little Help needed walking in hospital room?: A Little Help needed climbing 3-5 steps with a railing? : A Little 6 Click Score: 13    End of Session Equipment Utilized During Treatment: Gait belt Activity Tolerance: Patient tolerated treatment well Patient left: in chair;with call bell/phone within reach Nurse Communication: Mobility status PT Visit Diagnosis: Other abnormalities of gait and mobility (R26.89)    Time: 7915-0569 PT Time Calculation (min) (ACUTE ONLY): 22 min   Charges:   PT Evaluation $PT Eval Moderate Complexity: 1 Mod     PT G CodesMarland Kitchen        [facility] PT [place] 02/16/2017, 8:41 AM

## 2017-02-16 NOTE — Progress Notes (Addendum)
ANTICOAGULATION CONSULT NOTE - Initial Consult  Pharmacy Consult for Angiomax Indication: cardiac embolus; hx stroke, lupus anticoagulant +, hx PE/DVT, Afib  Allergies  Allergen Reactions  . Heparin Hives    Patient Measurements: Height: 5\' 6"  (167.6 cm) Weight: 289 lb (131.1 kg) IBW/kg (Calculated) : 59.3  Vital Signs: BP: 174/89 (11/21 2030) Pulse Rate: 84 (11/21 2030)  Labs: Recent Labs    02/15/17 1337 02/15/17 1350  HGB 14.0 14.6  HCT 42.4 43.0  PLT 168  --   APTT 30  --   LABPROT 17.3*  --   INR 1.43  --   CREATININE 0.90 0.70    Estimated Creatinine Clearance: 89.6 mL/min (by C-G formula based on SCr of 0.7 mg/dL).   Medical History: Past Medical History:  Diagnosis Date  . ACUT VENUS EMBO&THROMB DEEP VES PROX LOWR EXTREM    Positive lupus anticoagulant  . Acute right MCA stroke ([place]) 06/04/10   Treated with TPA and thrombectomy with hemorrhagic transformation; Carotids 3/12 negative for ICA stenosis  . Atrial fibrillation ([facility])    On chronic Coumadin  . Atrial thrombosis   . Depression   . Diastolic heart failure    Echo 3/12: EF 55-60%; mild LVH, mild AI, mild MR, moderate to severe LAE, moderate RAE, PASP 44  . HIT (heparin-induced thrombocytopenia) ([place])   . HTN (hypertension)   . Obesity, unspecified   . PULMONARY EMBOLISM    History of IVC filter     Medications:  Medications Prior to Admission  Medication Sig Dispense Refill Last Dose  . acetaminophen (TYLENOL) 500 MG tablet Take 1,000 mg by mouth every 6 (six) hours as needed for mild pain.   02/15/2017 at Unknown time  . aspirin 81 MG tablet Take 81 mg by mouth daily.     02/14/2017 at Unknown time  . CARTIA XT 300 MG 24 hr capsule TAKE 1 CAPSULE BY MOUTH ONCE DAILY 90 capsule 0 02/14/2017 at Unknown time  . cholecalciferol (VITAMIN D) 1000 units tablet Take 1,000 Units by mouth daily.   02/14/2017 at Unknown time  . furosemide (LASIX) 40 MG tablet Take 1 tablet (40 mg total) by mouth  daily. TAKE ONE TABLET BY MOUTH ONCE DAILY. NEED OFFICE VISIT FOR ADDITIONAL REFILLS. 30 tablet 6 02/14/2017 at Unknown time  . metoprolol tartrate (LOPRESSOR) 25 MG tablet Take 0.5 tablets (12.5 mg total) by mouth 2 (two) times daily. KEEP OV. 90 tablet 0 02/14/2017 at 1600  . warfarin (COUMADIN) 3 MG tablet Take 1.5 to 2 tablets by mouth once daily as directed - needs INR and MD appt for further refills 60 tablet 0 02/14/2017 at 1600  . potassium chloride (K-DUR) 10 MEQ tablet Take 1 tablet (10 mEq total) by mouth daily. 30 tablet 11 Taking  . [DISCONTINUED] KLOR-CON M10 10 MEQ tablet TAKE ONE TABLET BY MOUTH ONCE DAILY (Patient not taking: Reported on 02/15/2017) 30 tablet 6 Not Taking at Unknown time    Assessment: 71 y/o female who presented to the ED with RLE numbness. CT demonstrates right popliteal thrombus and she was taken emergently to the OR for embolectomy. She takes warfarin PTA but INR was subtherapeutic at 1.43. Pharmacy consulted to dose Angiomax.  Angiomax was started at 23:00 at 0.25 mg/kg/hr. No bleeding noted, pre-op CBC was normal.  Goal of Therapy:  aPTT 50-85 seconds Monitor platelets by anticoagulation protocol: Yes   Plan:  Continue Angiomax IV at 0.25 mg/kg/hr Check aPTT and CBC now Daily aPTT and CBC  Renold Genta, PharmD, [place] [phone] 02/16/2017 1:31 AM    Addendum: aPTT is therapeutic at 71 sec. Continue Angiomax at 0.25 mg/kg/hr. Will check a confirmatory aPTT with am labs.  Renold Genta, PharmD, BCPS Clinical Pharmacist 02/16/2017 3:10 AM    Addendum: aPTT reported back as 185 sec however it was drawn close to Angiomax infusion site. The aPTT redraw is therapeutic at 79 sec. Continue Angiomax at 0.25 mg/kg/hr.   Renold Genta, PharmD, BCPS Clinical Pharmacist 02/16/2017 7:42 AM

## 2017-02-16 NOTE — Progress Notes (Signed)
[facility]  Subjective  - numbness in foot better   Objective (!) 155/86 85 97.7 F (36.5 C) (Oral) 19 99%  Intake/Output Summary (Last 24 hours) at 02/16/2017 0543 Last data filed at 02/16/2017 0300 Gross per 24 hour  Intake 1178 ml  Output 660 ml  Net 518 ml   Right and left foot DP PT doppler Right leg incision clean no hematoma  Assessment/Planning: S/p right leg embolectomy Cardiology consult Will need TEE this hospital stay Continue angiomax until cardiology plan complete then resume coumadin Follow up labs later today  Ruta Hinds 02/16/2017 5:43 AM --  Laboratory Lab Results: Recent Labs    02/15/17 1337 02/15/17 1350 02/16/17 0217  WBC 8.2  --  9.0  HGB 14.0 14.6 14.1  HCT 42.4 43.0 42.8  PLT 168  --  162   BMET Recent Labs    02/15/17 1337 02/15/17 1350  NA 137 139  K 3.4* 3.9  CL 104 102  CO2 24  --   GLUCOSE 143* 141*  BUN 6 8  CREATININE 0.90 0.70  CALCIUM 8.6*  --     COAG Lab Results  Component Value Date   INR 1.43 02/15/2017   INR 1.8 11/21/2016   INR 1.9 10/14/2016   PROTIME 18.4 09/12/2008   PROTIME 27.4 08/29/2008   No results found for: PTT

## 2017-02-16 NOTE — Progress Notes (Signed)
ANTICOAGULATION CONSULT NOTE - Follow Up Consult  Pharmacy Consult for Warfarin Indication: R popliteal embolus; hx stroke, lupus anticoagulant +, PE/DVT, Afib  Allergies  Allergen Reactions  . Heparin Hives    Patient Measurements: Height: 5\' 6"  (167.6 cm) Weight: 288 lb 12.8 oz (131 kg) IBW/kg (Calculated) : 59.3  Vital Signs: Temp: 97.7 F (36.5 C) (11/22 0311) Temp Source: Oral (11/22 0311) BP: 155/86 (11/22 0415) Pulse Rate: 85 (11/22 0415)  Labs: Recent Labs    02/15/17 1337 02/15/17 1350 02/16/17 0217 02/16/17 0453 02/16/17 0621  HGB 14.0 14.6 14.1  --   --   HCT 42.4 43.0 42.8  --   --   PLT 168  --  162  --   --   APTT 30  --  71* 185* 79*  LABPROT 17.3*  --   --   --   --   INR 1.43  --   --   --   --   CREATININE 0.90 0.70  --   --   --     Estimated Creatinine Clearance: 89.6 mL/min (by C-G formula based on SCr of 0.7 mg/dL).  Assessment: 71 y/o female who presented to the ED with RLE numbness. CT demonstrated right popliteal thrombus and she was taken emergently to the OR for embolectomy. She takes warfarin PTA but INR was subtherapeutic at 1.43. Angiomax started post-op (hx HIT) and warfarin to resume today.  PTA dose per last clinic visit: 6mg  daily except 4.5mg  Sunday/Tuesday/Thursday  Goal of Therapy:  INR 2.5-3.5 per Dr. Rayann Heman but will need to aim higher 3.5-4ish while on bivalirudin aPTT 50-85 seconds Monitor platelets by anticoagulation protocol: Yes   Plan:  1) Warfarin 6mg  tonight 2) Continue angiomax @ 0.25mg /kg/hr 3) Daily INR, aPTT, CBC   Nena Jordan, PharmD, BCPS 02/16/2017 12:00 PM

## 2017-02-16 NOTE — Progress Notes (Signed)
[facility]

## 2017-02-17 ENCOUNTER — Encounter (HOSPITAL_COMMUNITY): Payer: Self-pay | Admitting: Vascular Surgery

## 2017-02-17 ENCOUNTER — Inpatient Hospital Stay (HOSPITAL_COMMUNITY): Payer: Medicare Other

## 2017-02-17 DIAGNOSIS — I10 Essential (primary) hypertension: Secondary | ICD-10-CM

## 2017-02-17 DIAGNOSIS — I743 Embolism and thrombosis of arteries of the lower extremities: Secondary | ICD-10-CM

## 2017-02-17 LAB — BASIC METABOLIC PANEL
ANION GAP: 7 (ref 5–15)
BUN: 9 mg/dL (ref 6–20)
CALCIUM: 8 mg/dL — AB (ref 8.9–10.3)
CHLORIDE: 104 mmol/L (ref 101–111)
CO2: 27 mmol/L (ref 22–32)
Creatinine, Ser: 0.85 mg/dL (ref 0.44–1.00)
GFR calc non Af Amer: >60 mL/min (ref >60)
Glucose, Bld: 141 mg/dL — ABNORMAL HIGH (ref 65–99)
Potassium: 3.8 mmol/L (ref 3.5–5.1)
SODIUM: 138 mmol/L (ref 135–145)

## 2017-02-17 LAB — CBC
HEMATOCRIT: 37.9 % (ref 36.0–46.0)
HEMOGLOBIN: 12.2 g/dL (ref 12.0–15.0)
MCH: 32.1 pg (ref 26.0–34.0)
MCHC: 32.2 g/dL (ref 30.0–36.0)
MCV: 99.7 fL (ref 78.0–100.0)
Platelets: 144 10*3/uL — ABNORMAL LOW (ref 150–400)
RBC: 3.8 MIL/uL — AB (ref 3.87–5.11)
RDW: 13.4 % (ref 11.5–15.5)
WBC: 11.4 10*3/uL — ABNORMAL HIGH (ref 4.0–10.5)

## 2017-02-17 LAB — APTT: aPTT: 81 seconds — ABNORMAL HIGH (ref 24–36)

## 2017-02-17 LAB — PROTIME-INR
INR: 3.79
Prothrombin Time: 37.1 seconds — ABNORMAL HIGH (ref 11.4–15.2)

## 2017-02-17 MED ORDER — WARFARIN SODIUM 3 MG PO TABS
6.0000 mg | ORAL_TABLET | Freq: Once | ORAL | Status: AC
  Administered 2017-02-17: 6 mg via ORAL
  Filled 2017-02-17: qty 2

## 2017-02-17 NOTE — Progress Notes (Signed)
Physical Therapy Treatment Patient Details Name: [name] MRN: [id number] DOB: 1945-08-30 Today's Date: 02/17/2017    History of Present Illness Pt adm with ischemic RLE. Underwent Right popliteal embolectomy on 02/16/17. PMH - afib, rt cva, dvt, PE, IVC filter    PT Comments    Pt doing well with mobility. Encouraged pt to amb over the weekend with nursing or on her own if IV not attached.   Follow Up Recommendations  No PT follow up     Equipment Recommendations  None recommended by PT    Recommendations for Other Services       Precautions / Restrictions Precautions Precautions: None Restrictions Weight Bearing Restrictions: No    Mobility  Bed Mobility               General bed mobility comments: Pt up in chair  Transfers Overall transfer level: Modified independent Equipment used: Rolling walker (2 wheeled) Transfers: Sit to/from Stand Sit to Stand: Modified independent (Device/Increase time)            Ambulation/Gait Ambulation/Gait assistance: Modified independent (Device/Increase time) Ambulation Distance (Feet): 500 Feet Assistive device: Rolling walker (2 wheeled) Gait Pattern/deviations: Step-through pattern;Decreased stride length Gait velocity: decr Gait velocity interpretation: Below normal speed for age/gender General Gait Details: Steady gait with walker   Stairs            Wheelchair Mobility    Modified Rankin (Stroke Patients Only)       Balance Overall balance assessment: No apparent balance deficits (not formally assessed)                                          Cognition Arousal/Alertness: Awake/alert Behavior During Therapy: WFL for tasks assessed/performed Overall Cognitive Status: Within Functional Limits for tasks assessed                                        Exercises      General Comments        Pertinent Vitals/Pain Pain Assessment: 0-10 Pain Score:  1  Pain Location: RLE Pain Descriptors / Indicators: Sore Pain Intervention(s): Limited activity within patient's tolerance;Monitored during session    Home Living                      Prior Function            PT Goals (current goals can now be found in the care plan section) Progress towards PT goals: Progressing toward goals    Frequency    Min 3X/week      PT Plan Current plan remains appropriate    Co-evaluation              AM-PAC PT "6 Clicks" Daily Activity  Outcome Measure  Difficulty turning over in bed (including adjusting bedclothes, sheets and blankets)?: None Difficulty moving from lying on back to sitting on the side of the bed? : None Difficulty sitting down on and standing up from a chair with arms (e.g., wheelchair, bedside commode, etc,.)?: None Help needed moving to and from a bed to chair (including a wheelchair)?: None Help needed walking in hospital room?: None Help needed climbing 3-5 steps with a railing? : A Little 6 Click Score: 23    End of Session  Activity Tolerance: Patient tolerated treatment well Patient left: in chair;with call bell/phone within reach Nurse Communication: Mobility status PT Visit Diagnosis: Other abnormalities of gait and mobility (R26.89)     Time: 3967-2897 PT Time Calculation (min) (ACUTE ONLY): 22 min  Charges:  $Gait Training: 8-22 mins                    G Codes:       [facility] PT [place] 02/17/2017, 3:25 PM

## 2017-02-17 NOTE — Progress Notes (Addendum)
Progress Note  Patient Name: [name] Date of Encounter: 02/17/2017  Primary Cardiologist: Dr. Percival Spanish  Subjective   Patient is feeling well; denies chest pain, SOB, and palpitations.  Inpatient Medications    Scheduled Meds: . aspirin  81 mg Oral Daily  . cholecalciferol  1,000 Units Oral Daily  . diltiazem  300 mg Oral Daily  . docusate sodium  100 mg Oral Daily  . furosemide  40 mg Oral Daily  . Influenza vac split quadrivalent PF  0.5 mL Intramuscular Tomorrow-1000  . metoprolol tartrate  12.5 mg Oral BID  . pantoprazole  40 mg Oral Daily  . pneumococcal 23 valent vaccine  0.5 mL Intramuscular Tomorrow-1000  . potassium chloride  10 mEq Oral Daily  . warfarin  6 mg Oral ONCE-1800  . Warfarin - Pharmacist Dosing Inpatient   Does not apply q1800   Continuous Infusions: . sodium chloride    . bivalirudin (ANGIOMAX) infusion 0.5 mg/mL (Non-ACS indications) 0.25 mg/kg/hr (02/16/17 2322)  . dextrose 5 % and 0.45 % NaCl with KCl 20 mEq/L 75 mL/hr at 02/16/17 0300  . magnesium sulfate 1 - 4 g bolus IVPB     PRN Meds: sodium chloride, acetaminophen **OR** acetaminophen, alum & mag hydroxide-simeth, guaiFENesin-dextromethorphan, hydrALAZINE, labetalol, magnesium sulfate 1 - 4 g bolus IVPB, metoprolol tartrate, morphine injection, ondansetron, oxyCODONE, phenol, potassium chloride   Vital Signs    Vitals:   02/16/17 2037 02/17/17 0005 02/17/17 0410 02/17/17 0738  BP: 127/70 129/64 (!) 134/54 (!) 149/51  Pulse: 82 70 (!) 50 (!) 31  Resp: 20 18 17 15   Temp: 98.2 F (36.8 C) 98 F (36.7 C) 97.8 F (36.6 C) 98.1 F (36.7 C)  TempSrc: Oral Oral Oral Oral  SpO2: 95% 98% 96% 96%  Weight:      Height:        Intake/Output Summary (Last 24 hours) at 02/17/2017 1019 Last data filed at 02/17/2017 0500 Gross per 24 hour  Intake 574.8 ml  Output 1250 ml  Net -675.2 ml   Filed Weights   02/15/17 1409 02/16/17 0311  Weight: 289 lb (131.1 kg) 288 lb 12.8 oz (131  kg)     Physical Exam   General: Well developed, well nourished, female appearing in no acute distress. Head: Normocephalic, atraumatic.  Neck: Supple without bruits, no JVD Lungs:  Resp regular and unlabored, CTA, diminished in bases Heart: Irregular rhythm, regular rate, no murmur; no rub. Abdomen: Soft, non-tender, non-distended with normoactive bowel sounds. No hepatomegaly. No rebound/guarding. No obvious abdominal masses. Extremities: No clubbing, cyanosis, no edema, right leg incision C/D/I with staples and dressing in place Neuro: Alert and oriented X 3. Moves all extremities spontaneously. Psych: Normal affect.  Labs    Chemistry Recent Labs  Lab 02/15/17 1337 02/15/17 1350 02/17/17 0216  NA 137 139 138  K 3.4* 3.9 3.8  CL 104 102 104  CO2 24  --  27  GLUCOSE 143* 141* 141*  BUN 6 8 9   CREATININE 0.90 0.70 0.85  CALCIUM 8.6*  --  8.0*  PROT 7.6  --   --   ALBUMIN 3.5  --   --   AST 21  --   --   ALT 15  --   --   ALKPHOS 97  --   --   BILITOT 0.8  --   --   GFRNONAA >60  --  >60  GFRAA >60  --  >60  ANIONGAP 9  --  7     Hematology Recent Labs  Lab 02/15/17 1337 02/15/17 1350 02/16/17 0217 02/17/17 0216  WBC 8.2  --  9.0 11.4*  RBC 4.28  --  4.30 3.80*  HGB 14.0 14.6 14.1 12.2  HCT 42.4 43.0 42.8 37.9  MCV 99.1  --  99.5 99.7  MCH 32.7  --  32.8 32.1  MCHC 33.0  --  32.9 32.2  RDW 13.3  --  13.4 13.4  PLT 168  --  162 144*    Cardiac EnzymesNo results for input(s): TROPONINI in the last 168 hours.  Recent Labs  Lab 02/15/17 1348  TROPIPOC 0.00     BNPNo results for input(s): BNP, PROBNP in the last 168 hours.   DDimer No results for input(s): DDIMER in the last 168 hours.   Radiology    Ct Angio Low Extrem Right W &/or Wo Contrast  Result Date: 02/15/2017 CLINICAL DATA:  Arterial embolism of the right lower extremity. Right lower extremity numbness onset this morning. EXAM: CT ANGIOGRAPHY LOWER RIGHT EXTREMITY<Procedure  Description>CT ANGIOGRAPHY LOWER RIGHT EXTREMITY TECHNIQUE: CTA of the right lower extremity from distal abdominal aorta through the ankle sagittal coronal reformats are provided of the right lower extremity. CONTRAST:  100 cc of Isovue 370 IV COMPARISON:  None. FINDINGS: No abdominal aortic aneurysm or dissection of the included mid to distal abdominal aorta. An adjacent right-sided IVC filter is present. Rounded hypodensity adjacent to the included right kidney may represent partial volume averaging of a renal cyst or potentially the gallbladder. This measures 4 cm in diameter. There is scattered colonic diverticulosis without acute diverticulitis. Urinary bladder is unremarkable. The uterus and included right adnexa is normal. No significant stenosis or dissection is noted of the right common, internal and external iliac arteries. The common femoral artery is patent without significant stenosis as are the branch vessels. There is however abrupt termination of the popliteal artery at the level of the knee joint with reconstitution of the tibial arteries via thin sural collaterals. Reconstitution of the tibioperoneal trunk is demonstrated with proximal short-segment occlusion of the posterior tibial artery. The dominant runoff to the ankle is to the anterior tibial artery which can be seen to the level of the ankle. The peroneal artery can be visualized to the distal calf and reconstituted posterior tibial artery to the ankle. Review of the MIP images confirms the above findings. IMPRESSION: 1. Occlusion of the popliteal artery at the level of the knee joint for a span of 5 cm believed to be embolic in etiology with short segmental occlusion of the proximal posterior tibial artery within the calf. These results were called by telephone at the time of interpretation on 02/15/2017 at 7:15 pm to Dr. Hazle Coca, who verbally acknowledged these results. 2. Reconstitution of the tibial arteries via sural collaterals with  dominant runoff via the anterior tibial artery. Electronically Signed   By: Ashley Royalty M.D.   On: 02/15/2017 19:16   Ct Head Code Stroke Wo Contrast  Result Date: 02/15/2017 CLINICAL DATA:  Code stroke. 71 year old female with right lower extremity numbness at 1100 hours. EXAM: CT HEAD WITHOUT CONTRAST TECHNIQUE: Contiguous axial images were obtained from the base of the skull through the vertex without intravenous contrast. COMPARISON:  Head CT without contrast 06/11/2010 and earlier. FINDINGS: Brain: Sequelae of hemorrhagic right basal ganglia infarct that occurred in 2012. Chronic encephalomalacia now at that site. Mild ex vacuo enlargement of the right lateral ventricle. Small chronic appearing infarct in the  right cerebellar hemisphere. Mild motion artifact. No midline shift, ventriculomegaly, mass effect, evidence of mass lesion, intracranial hemorrhage or evidence of cortically based acute infarction. Vascular: Calcified atherosclerosis at the skull base. No suspicious intracranial vascular hyperdensity. Skull: No acute osseous abnormality identified. Sinuses/Orbits: stable to improved sinus aeration since 2012. Mastoids are clear. Other: No acute orbit or scalp soft tissue finding. ASPECTS [facility] Stroke Program Early CT Score) - Ganglionic level infarction (caudate, lentiform nuclei, internal capsule, insula, M1-M3 cortex): 7 - Supraganglionic infarction (M4-M6 cortex): 3 Total score (0-10 with 10 being normal): 10 IMPRESSION: 1. No acute cortically based infarct or acute intracranial hemorrhage. 2. ASPECTS is 10. 3. Chronic hemorrhagic infarct of the right basal ganglia. Small chronic lacune in the right cerebellum. 4. The above was relayed via text pager to Dr. Bruce Donath on 02/15/2017 at 14:01 . Electronically Signed   By: Genevie Ann M.D.   On: 02/15/2017 14:01     Telemetry    Rate controlled Afib - Personally Reviewed  ECG    No new tracings - Personally Reviewed   Cardiac Studies    none  Patient Profile     71 y.o. 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  Assessment & Plan    1. Right popliteal embolism - this may be a consequence of missing doses of coumadin last week - management by vascular surgery - coumadin has been restarted, INR 3.79 on bivalirudin - bivalirudin transitioned off  - continue coumadin   2. Permanent atrial fibrillation, rate controlled - home medications include 300 mg diltiazem and lopressor 12.5 mg BID - continue current regimen - Afib rate controlled   3. Positive lupus anticoagulant - continue AC   4. HTN - pressures have been labile between 944-967R systolic, although higher pressures recorded before morning medications given - continue to follow pressure   Ms Shutters is stable on home medications. No changes prior to discharge. She remains in rate controlled Afib and is tolerating the rhythm well. We do not anticipate changes in medications before her discharge on Monday.  Signed, Ledora Bottcher , PA-C 10:19 AM 02/17/2017 Pager: [phone]   Attending Addendum:   History and all data above reviewed.  Patient examined.  I agree with the findings as above.  All available labs, radiology testing, previous records reviewed. Agree with documented assessment and plan. Ms. Nissley is a 71 y.o. female with permanent atrial fibrillation on coumadin, SLE, prior DVT/PE (2007), bilateral femoral artery emboli, CVA, and HIT here with R popliteal embolus s/p embolectomy.  INR was subtherapeutic on warfarin.  She is feeling well and denies chest pain, shortness of breath, palpitations or lower extremity pain.  Rates are well-controlled.   She occasionally has pauses up to 2 seconds but is asymptomatic.  Continue home metoprolol and diltiazem.  She is bridging to warfarin on bivalirudin given her history of HIT.  INR is 3.79, but this is artificially elevated due to bivalirudin.  Pharmacy presumably uses another calculator, as I don't see any Chromogenic Xa levels drawn.  INR goal 2.5-3.5.      Haruto Demaria C. Oval Linsey, MD, [facility]  02/17/2017 1:18 PM

## 2017-02-17 NOTE — Progress Notes (Signed)
Tech performed ABI

## 2017-02-17 NOTE — Progress Notes (Signed)
  Progress Note    02/17/2017 7:25 AM 2 Days Post-Op  Subjective:  Denies calf pain or rest pain R foot; Patient states RLE feels much better compared to before surgery.   Vitals:   02/17/17 0005 02/17/17 0410  BP: 129/64 (!) 134/54  Pulse: 70 (!) 50  Resp: 18 17  Temp: 98 F (36.7 C) 97.8 F (36.6 C)  SpO2: 98% 96%   Physical Exam: Cardiac:  Irregular Lungs:  Clear lung sounds B lung fields Incisions:  R mid leg incision without bleeding or drainage; no palpable hematoma Extremities: palpable R DP; R calf soft without tenderness to palpation; L foot warm to touch with good capillary refill Abdomen:  Soft  CBC    Component Value Date/Time   WBC 11.4 (H) 02/17/2017 0216   RBC 3.80 (L) 02/17/2017 0216   HGB 12.2 02/17/2017 0216   HCT 37.9 02/17/2017 0216   PLT 144 (L) 02/17/2017 0216   MCV 99.7 02/17/2017 0216   MCH 32.1 02/17/2017 0216   MCHC 32.2 02/17/2017 0216   RDW 13.4 02/17/2017 0216   LYMPHSABS 1.1 02/15/2017 1337   MONOABS 0.5 02/15/2017 1337   EOSABS 0.1 02/15/2017 1337   BASOSABS 0.0 02/15/2017 1337    BMET    Component Value Date/Time   NA 138 02/17/2017 0216   K 3.8 02/17/2017 0216   CL 104 02/17/2017 0216   CO2 27 02/17/2017 0216   GLUCOSE 141 (H) 02/17/2017 0216   BUN 9 02/17/2017 0216   CREATININE 0.85 02/17/2017 0216   CALCIUM 8.0 (L) 02/17/2017 0216   GFRNONAA >60 02/17/2017 0216   GFRAA >60 02/17/2017 0216    INR    Component Value Date/Time   INR 3.79 02/17/2017 0216   INR 2.2 05/07/2010 1039     Intake/Output Summary (Last 24 hours) at 02/17/2017 0725 Last data filed at 02/17/2017 0500 Gross per 24 hour  Intake 574.8 ml  Output 2000 ml  Net -1425.2 ml     Assessment/Plan:  71 y.o. female is s/p RLE embolectomy 2 Days Post-Op   INR therapeutic; pharmacy dosing coumadin D/c angiomax bridge Continue coumadin and aspirin; no indication for TEE per Cardiology Continue to monitor peripheral signals; check LLE arterial  duplex due to diminished L pedal signals Encouraged ambulation   DVT prophylaxis:  Therapeutic INR with coumadin   [name], PA-C [facility] [phone] 02/17/2017 7:25 AM   I agree with the above Incision looks good Palpable right DP  Ambulating in halls Angiomax and coumadin overlap for 5 days.  INR 3.7 today Arterial duplex pending  [name]

## 2017-02-17 NOTE — Care Management Note (Signed)
Case Management Note [name] RN, BSN Unit 4E-Case Manager [phone]  Patient Details  Name: [name] MRN: [id number] Date of Birth: April 23, 1945  Subjective/Objective:  Pt admitted s/p RLE embolectomy                 Action/Plan: PTA pt lived at home, independent- anticipate return home- CM to follow.   Expected Discharge Date:                  Expected Discharge Plan:  Home/Self Care  In-House Referral:  NA  Discharge planning Services  CM Consult  Post Acute Care Choice:    Choice offered to:     DME Arranged:    DME Agency:     HH Arranged:    HH Agency:     Status of Service:  In process, will continue to follow  If discussed at Long Length of Stay Meetings, dates discussed:    Discharge Disposition:   Additional Comments:  Dawayne Patricia, RN 02/17/2017, 10:26 AM

## 2017-02-17 NOTE — Discharge Instructions (Signed)
[facility]  Discharge instructions  Lower Extremity Surgery  Please refer to the following instruction for your post-procedure care. Your surgeon or physician assistant will discuss any changes with you.  Activity  You are encouraged to walk as much as you can. You can slowly return to normal activities during the month after your surgery. Avoid strenuous activity and heavy lifting until your doctor tells you it's OK. Avoid activities such as vacuuming or swinging a golf club. Do not drive until your doctor give the OK and you are no longer taking prescription pain medications. It is also normal to have difficulty with sleep habits, eating and bowel movement after surgery. These will go away with time.  Bathing/Showering  You may shower after you go home. Do not soak in a bathtub, hot tub, or swim until the incision heals completely.  Incision Care  Clean your incision with mild soap and water. Shower every day. Pat the area dry with a clean towel. You do not need a bandage unless otherwise instructed. Do not apply any ointments or creams to your incision. If you have open wounds you will be instructed how to care for them or a visiting nurse may be arranged for you. If you have staples or sutures along your incision they will be removed at your post-op appointment. You may have skin glue on your incision. Do not peel it off. It will come off on its own in about one week.  Diet  Resume your normal diet. There are no special food restrictions following this procedure. A low fat/ low cholesterol diet is recommended for all patients with vascular disease. In order to heal from your surgery, it is CRITICAL to get adequate nutrition. Your body requires vitamins, minerals, and protein. Vegetables are the best source of vitamins and minerals. Vegetables also provide the perfect balance of protein. Processed food has little nutritional value, so try to avoid  this.  Medications  Resume taking all your medications unless your doctor or physician assistant tells you not to. If your incision is causing pain, you may take over-the-counter pain relievers such as acetaminophen (Tylenol). If you were prescribed a stronger pain medication, please aware these medication can cause nausea and constipation. Prevent nausea by taking the medication with a snack or meal. Avoid constipation by drinking plenty of fluids and eating foods with high amount of fiber, such as fruits, vegetables, and grains. Take Colace 100 mg (an over-the-counter stool softener) twice a day as needed for constipation.  Do not take Tylenol if you are taking prescription pain medications.  Follow Up  Our office will schedule a follow up appointment 2-3 weeks following discharge.  Please call us immediately for any of the following conditions  Severe or worsening pain in your legs or feet while at rest or while walking Increase pain, redness, warmth, or drainage (pus) from your incision site(s) Fever of 101 degree or higher The swelling in your leg with the bypass suddenly worsens and becomes more painful than when you were in the hospital If you have been instructed to feel your graft pulse then you should do so every day. If you can no longer feel this pulse, call the office immediately. Not all patients are given this instruction.  Leg swelling is common after leg bypass surgery.  The swelling should improve over a few months following surgery. To improve the swelling, you may elevate your legs above the level of your heart while you  are sitting or resting. Your surgeon or physician assistant may ask you to apply an ACE wrap or wear compression (TED) stockings to help to reduce swelling.  Reduce your risk of vascular disease  Stop smoking. If you would like help call [facility] at 1-800-QUIT-NOW 2694086622) or [place] at [phone].  Manage your cholesterol Maintain a  desired weight Control your diabetes weight Control your diabetes Keep your blood pressure down  If you have any questions, please call the office at [phone]

## 2017-02-17 NOTE — Progress Notes (Signed)
ANTICOAGULATION CONSULT NOTE - Follow Up Consult  Pharmacy Consult for Bival + Coumadin Indication: cardiac embolus, stroke, lupus anticoagulant +, PE/DVT, Afib.   Allergies  Allergen Reactions  . Heparin Hives    Patient Measurements: Height: 5\' 6"  (167.6 cm) Weight: 288 lb 12.8 oz (131 kg) IBW/kg (Calculated) : 59.3  Vital Signs: Temp: 98.1 F (36.7 C) (11/23 0738) Temp Source: Oral (11/23 0738) BP: 149/51 (11/23 0738) Pulse Rate: 31 (11/23 0738)  Labs: Recent Labs    02/15/17 1337 02/15/17 1350 02/16/17 0217 02/16/17 0453 02/16/17 0621 02/17/17 0216  HGB 14.0 14.6 14.1  --   --  12.2  HCT 42.4 43.0 42.8  --   --  37.9  PLT 168  --  162  --   --  144*  APTT 30  --  71* 185* 79* 81*  LABPROT 17.3*  --   --   --   --  37.1*  INR 1.43  --   --   --   --  3.79  CREATININE 0.90 0.70  --   --   --  0.85    Estimated Creatinine Clearance: 84.3 mL/min (by C-G formula based on SCr of 0.85 mg/dL).  Assessment: Anticoag: Warfarin PTA for hx cardiac embolus, stroke, lupus anticoagulant +, PE/DVT, Afib.  Admitted w/ right leg numbness, warfarin held, underwent right leg embolectomy 11/21 Angiomax started post-op given heparin allergy. APTT 81 in goal, INR 3.79 (elevated due to interaction with bival). Hgb 12.2, Plts 144. - 11/23: Based on protocol calculation page 10, estimated true INR 1.43. Must overlap warfarin and bival for a minimum of 5 days!  -PTA dose per last [facility] clinic visit: 6mg  daily except 4.5mg  on Sunday/Tuesday/Thursday   Goal of Therapy:  aPTT 50-85 seconds Monitor platelets by anticoagulation protocol: Yes   Plan:  Continue Angiomax IV at 0.25 mg/kg/hr (aPTT goal 50-85 sec) Daily aPTT and CBC Warfarin to resume today with goal INR 2.5-3.5 per Dr. Rayann Heman (will need to aim higher 3.5-4ish while on bivalirudin) Warfarin 6mg  tonight again Daily INR, aPTT, CBC Overlap bivalirudin and warfarin x 5 days!  Naveah Brave S. Alford Highland, PharmD, BCPS Clinical Staff  Pharmacist Pager [phone]  [place], [place] 02/17/2017,8:59 AM

## 2017-02-18 ENCOUNTER — Inpatient Hospital Stay (HOSPITAL_COMMUNITY): Payer: Medicare Other

## 2017-02-18 DIAGNOSIS — I743 Embolism and thrombosis of arteries of the lower extremities: Secondary | ICD-10-CM

## 2017-02-18 LAB — CBC
HEMATOCRIT: 37.7 % (ref 36.0–46.0)
Hemoglobin: 12.2 g/dL (ref 12.0–15.0)
MCH: 32.5 pg (ref 26.0–34.0)
MCHC: 32.4 g/dL (ref 30.0–36.0)
MCV: 100.5 fL — AB (ref 78.0–100.0)
PLATELETS: 147 10*3/uL — AB (ref 150–400)
RBC: 3.75 MIL/uL — AB (ref 3.87–5.11)
RDW: 13.7 % (ref 11.5–15.5)
WBC: 8.3 10*3/uL (ref 4.0–10.5)

## 2017-02-18 LAB — PROTIME-INR
INR: 3.9
Prothrombin Time: 37.9 seconds — ABNORMAL HIGH (ref 11.4–15.2)

## 2017-02-18 LAB — APTT: aPTT: 81 seconds — ABNORMAL HIGH (ref 24–36)

## 2017-02-18 MED ORDER — WARFARIN SODIUM 3 MG PO TABS
6.0000 mg | ORAL_TABLET | Freq: Once | ORAL | Status: AC
  Administered 2017-02-18: 6 mg via ORAL
  Filled 2017-02-18: qty 2

## 2017-02-18 NOTE — Progress Notes (Signed)
ANTICOAGULATION CONSULT NOTE - Follow Up Consult  Pharmacy Consult for Warfarin and Angiomax Indication: R popliteal embolus; hx stroke, lupus anticoagulant +, PE/DVT, Afib  Allergies  Allergen Reactions  . Heparin Hives    Patient Measurements: Height: 5\' 6"  (167.6 cm) Weight: 288 lb 12.8 oz (131 kg) IBW/kg (Calculated) : 59.3  Vital Signs: Temp: 97.6 F (36.4 C) (11/24 0829) Temp Source: Oral (11/24 0829) BP: 143/78 (11/24 1140) Pulse Rate: 69 (11/24 1140)  Labs: Recent Labs    02/15/17 1350 02/16/17 0217  02/16/17 0621 02/17/17 0216 02/18/17 0233  HGB 14.6 14.1  --   --  12.2 12.2  HCT 43.0 42.8  --   --  37.9 37.7  PLT  --  162  --   --  144* 147*  APTT  --  71*   < > 79* 81* 81*  LABPROT  --   --   --   --  37.1* 37.9*  INR  --   --   --   --  3.79 3.90  CREATININE 0.70  --   --   --  0.85  --    < > = values in this interval not displayed.    Estimated Creatinine Clearance: 84.3 mL/min (by C-G formula based on SCr of 0.85 mg/dL).  Medications: Angiomax  @ 0.25mg /kg/hr  Assessment: 71 y/o female who presented to the ED with RLE numbness. CT demonstrated right popliteal thrombus and she was taken emergently to the OR for embolectomy. She takes warfarin PTA but INR was subtherapeutic at 1.43. Angiomax started post-op (hx HIT) and warfarin resumed 11/21.   Today is day #4/5 overlap. APTT is therapeutic at 81 seconds. INR 3.9 (falsely elevated due to bivalirudin but probably close to therapeutic). CBC stable. No bleeding.  PTA dose per last clinic visit: 6mg  daily except 4.5mg  Sunday/Tuesday/Thursday  Goal of Therapy:  INR 2.5-3.5 per Dr. Rayann Heman but will need to aim higher 3.5-4ish while on bivalirudin aPTT 50-85 seconds Monitor platelets by anticoagulation protocol: Yes   Plan:  1) Warfarin 6mg  tonight 2) Continue angiomax @ 0.25mg /kg/hr 3) Daily INR, aPTT, CBC **Needs 1 more day of overlap so will plan to stop bivalirudin tomorrow and recheck INR 4  hours later to see where we are at.    Nena Jordan, PharmD, BCPS 02/18/2017 1:45 PM

## 2017-02-18 NOTE — Progress Notes (Addendum)
  Progress Note    02/18/2017 7:31 AM 3 Days Post-Op  Subjective:  "I feel fine"  Afebrile HR 40's-70's Afib 834'H-962'I systolic 29% RA  Vitals:   02/18/17 0346 02/18/17 0400  BP: (!) 167/63   Pulse: 66 61  Resp: (!) 22 19  Temp: 98.2 F (36.8 C)   SpO2: 97% 96%    Physical Exam: Cardiac:  irregular Lungs:  Non labored Incisions:  Clean and dry with scant bloody drainage on the bed Extremities:  Easily palpable right DP pulse; calf soft/non tender   CBC    Component Value Date/Time   WBC 8.3 02/18/2017 0233   RBC 3.75 (L) 02/18/2017 0233   HGB 12.2 02/18/2017 0233   HCT 37.7 02/18/2017 0233   PLT 147 (L) 02/18/2017 0233   MCV 100.5 (H) 02/18/2017 0233   MCH 32.5 02/18/2017 0233   MCHC 32.4 02/18/2017 0233   RDW 13.7 02/18/2017 0233   LYMPHSABS 1.1 02/15/2017 1337   MONOABS 0.5 02/15/2017 1337   EOSABS 0.1 02/15/2017 1337   BASOSABS 0.0 02/15/2017 1337    BMET    Component Value Date/Time   NA 138 02/17/2017 0216   K 3.8 02/17/2017 0216   CL 104 02/17/2017 0216   CO2 27 02/17/2017 0216   GLUCOSE 141 (H) 02/17/2017 0216   BUN 9 02/17/2017 0216   CREATININE 0.85 02/17/2017 0216   CALCIUM 8.0 (L) 02/17/2017 0216   GFRNONAA >60 02/17/2017 0216   GFRAA >60 02/17/2017 0216    INR    Component Value Date/Time   INR 3.90 02/18/2017 0233   INR 2.2 05/07/2010 1039     Intake/Output Summary (Last 24 hours) at 02/18/2017 0731 Last data filed at 02/18/2017 0400 Gross per 24 hour  Intake -  Output 1850 ml  Net -1850 ml     Assessment:  71 y.o. female is s/p:  Right popliteal embolectomy  3 Days Post-Op  Plan: -pt doing well this morning with easily palpable right DP pulse.   -DVT prophylaxis:  Angiomax to coumadin bridge-day 3 of angiomax with 5 day overlap of coumadin.  She has her INR checked at [facility]. -continue mobilizing   Leontine Locket, [place] [facility] [phone] 02/18/2017 7:31 AM  I have  interviewed the patient and examined the patient. I agree with the findings by the PA. Palpable DP pulse. Incision looks fine. Goal is for INR to be between 2.5 and 3.5, with a 5 day period of overlap with angiomax.  Gae Gallop, MD [phone]

## 2017-02-18 NOTE — Progress Notes (Signed)
Left lower extremity duplex completed. Fuig Jiovany Scheffel,RVS 02/18/2017,4:15 pm

## 2017-02-18 NOTE — Progress Notes (Signed)
Progress Note  Patient Name: [name] Date of Encounter: 02/18/2017  Primary Cardiologist: Dr. Percival Spanish  Patient Profile     71 y.o. 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  Subjective   Without complaints  No leg pain  Ambulating in room   Inpatient Medications    Scheduled Meds: . aspirin  81 mg Oral Daily  . cholecalciferol  1,000 Units Oral Daily  . diltiazem  300 mg Oral Daily  . docusate sodium  100 mg Oral Daily  . furosemide  40 mg Oral Daily  . metoprolol tartrate  12.5 mg Oral BID  . pantoprazole  40 mg Oral Daily  . pneumococcal 23 valent vaccine  0.5 mL Intramuscular Tomorrow-1000  . potassium chloride  10 mEq Oral Daily  . Warfarin - Pharmacist Dosing Inpatient   Does not apply q1800   Continuous Infusions: . sodium chloride    . bivalirudin (ANGIOMAX) infusion 0.5 mg/mL (Non-ACS indications) 0.25 mg/kg/hr (02/18/17 0835)  . dextrose 5 % and 0.45 % NaCl with KCl 20 mEq/L 10 mL/hr at 02/17/17 1036  . magnesium sulfate 1 - 4 g bolus IVPB     PRN Meds: sodium chloride, acetaminophen **OR** acetaminophen, alum & mag hydroxide-simeth, guaiFENesin-dextromethorphan, hydrALAZINE, labetalol, magnesium sulfate 1 - 4 g bolus IVPB, metoprolol tartrate, morphine injection, ondansetron, oxyCODONE, phenol, potassium chloride   Vital Signs    Vitals:   02/18/17 0400 02/18/17 0800 02/18/17 0829 02/18/17 0954  BP:   111/86 (!) 155/72  Pulse: 61 (!) 37 71 78  Resp: 19 15 17    Temp:   97.6 F (36.4 C)   TempSrc:   Oral   SpO2: 96% 97% 97%   Weight:      Height:        Intake/Output Summary (Last 24 hours) at 02/18/2017 1051 Last data filed at 02/18/2017 0900 Gross per  24 hour  Intake 240 ml  Output 1550 ml  Net -1310 ml   Filed Weights   02/15/17 1409 02/16/17 0311  Weight: 289 lb (131.1 kg) 288 lb 12.8 oz (131 kg)     Physical Exam  Well developed and nourished in no acute distress HENT normal Neck supple with JVP-flat Clear Regular rate and rhythm, no murmurs or gallops Abd-soft with active BS No Clubbing cyanosis edema Skin-warm and dry A & Oriented  Grossly normal sensory and motor function  Labs    Chemistry Recent Labs  Lab 02/15/17 1337 02/15/17 1350 02/17/17 0216  NA 137 139 138  K 3.4* 3.9 3.8  CL 104 102 104  CO2 24  --  27  GLUCOSE 143* 141* 141*  BUN 6 8 9   CREATININE 0.90 0.70 0.85  CALCIUM 8.6*  --  8.0*  PROT 7.6  --   --   ALBUMIN 3.5  --   --   AST 21  --   --   ALT 15  --   --   ALKPHOS 97  --   --   BILITOT 0.8  --   --   GFRNONAA >60  --  >60  GFRAA >60  --  >60  ANIONGAP 9  --  7     Hematology Recent Labs  Lab 02/16/17 0217  02/17/17 0216 02/18/17 0233  WBC 9.0 11.4* 8.3  RBC 4.30 3.80* 3.75*  HGB 14.1 12.2 12.2  HCT 42.8 37.9 37.7  MCV 99.5 99.7 100.5*  MCH 32.8 32.1 32.5  MCHC 32.9 32.2 32.4  RDW 13.4 13.4 13.7  PLT 162 144* 147*    Cardiac EnzymesNo results for input(s): TROPONINI in the last 168 hours.  Recent Labs  Lab 02/15/17 1348  TROPIPOC 0.00     BNPNo results for input(s): BNP, PROBNP in the last 168 hours.   DDimer No results for input(s): DDIMER in the last 168 hours.   Radiology    No results found.   Telemetry    Rate controlled Afib - Personally Reviewed  ECG    No new tracings - Personally Reviewed   Cardiac Studies   none    Assessment & Plan    1. Right popliteal embolism - this may be a consequence of missing doses of coumadin last week - management by vascular surgery - coumadin has been restarted, INR 3.79 on bivalirudin - bivalirudin transitioned off  - continue coumadin   2. Permanent atrial fibrillation, rate controlled - home  medications include 300 mg diltiazem and lopressor 12.5 mg BID - continue current regimen - Afib rate controlled   3. Positive lupus anticoagulant - continue AC   4. HTN - pressures have been labile between 932-355D systolic, although higher pressures recorded before morning medications given - continue to follow pressure  Per Pharmacy there will need overlap of Bivalarudin and coumadin x 5 days-- not sure if this refers to post therapeutic calculated INR or taking both    There is considerable controversy re approach to pts with recurrent embolism on anticoagulation; but as she was NONTHERAPEUTIC it would seem the right strategy is compliance, targeting 2.5 and not using ASA  Although more remote history not reviewed   will defer to [facility] who knows pt well  and agree with others that hematology consult may be helpful

## 2017-02-19 DIAGNOSIS — R001 Bradycardia, unspecified: Secondary | ICD-10-CM

## 2017-02-19 LAB — PROTIME-INR
INR: 4.38
INR: 1.59
PROTHROMBIN TIME: 41.5 s — AB (ref 11.4–15.2)
Prothrombin Time: 18.8 seconds — ABNORMAL HIGH (ref 11.4–15.2)

## 2017-02-19 LAB — APTT
APTT: 91 s — AB (ref 24–36)
aPTT: 92 seconds — ABNORMAL HIGH (ref 24–36)

## 2017-02-19 LAB — CBC
HCT: 39.4 % (ref 36.0–46.0)
HEMOGLOBIN: 12.8 g/dL (ref 12.0–15.0)
MCH: 32.4 pg (ref 26.0–34.0)
MCHC: 32.5 g/dL (ref 30.0–36.0)
MCV: 99.7 fL (ref 78.0–100.0)
Platelets: 153 10*3/uL (ref 150–400)
RBC: 3.95 MIL/uL (ref 3.87–5.11)
RDW: 13.5 % (ref 11.5–15.5)
WBC: 9.5 10*3/uL (ref 4.0–10.5)

## 2017-02-19 MED ORDER — WARFARIN SODIUM 4 MG PO TABS
8.0000 mg | ORAL_TABLET | Freq: Once | ORAL | Status: AC
  Administered 2017-02-19: 8 mg via ORAL
  Filled 2017-02-19: qty 2

## 2017-02-19 MED ORDER — DIPHENHYDRAMINE HCL 25 MG PO CAPS
25.0000 mg | ORAL_CAPSULE | Freq: Four times a day (QID) | ORAL | Status: DC | PRN
  Administered 2017-02-19 – 2017-02-21 (×4): 25 mg via ORAL
  Filled 2017-02-19 (×4): qty 1

## 2017-02-19 MED ORDER — SODIUM CHLORIDE 0.9 % IV SOLN
0.1200 mg/kg/h | INTRAVENOUS | Status: DC
  Administered 2017-02-19: 0.25 mg/kg/h via INTRAVENOUS
  Filled 2017-02-19 (×3): qty 250

## 2017-02-19 NOTE — Progress Notes (Signed)
CRITICAL VALUE ALERT  Critical Value: INR 4.38  Date & Time Notied:  02/19/17, 03:15  Provider Notified: Yes  Orders Received/Actions taken: Yes

## 2017-02-19 NOTE — Progress Notes (Signed)
Progress Note  Patient Name: [name] Date of Encounter: 02/19/2017  Primary Cardiologist: Dr. Percival Spanish  Patient Profile     71 y.o. female with a history of permanent atrial fibrillation on chronic coumadin, complicated by lupus anticoagulant and prior embolic events including DVT, bilat PE (2007), bilat femoral artery emboli (8338), embolic CVA (2505), and HIT, who is being seen today for the evaluation of ongoing Afib and R popliteal embolus s/p embolectomy.  Subjective   Without complaints Feels great   Inpatient Medications    Scheduled Meds: . aspirin  81 mg Oral Daily  . cholecalciferol  1,000 Units Oral Daily  . diltiazem  300 mg Oral Daily  . docusate sodium  100 mg Oral Daily  . furosemide  40 mg Oral Daily  . metoprolol tartrate  12.5 mg Oral BID  . pantoprazole  40 mg Oral Daily  . potassium chloride  10 mEq Oral Daily  . Warfarin - Pharmacist Dosing Inpatient   Does not apply q1800   Continuous Infusions: . sodium chloride    . bivalirudin (ANGIOMAX) infusion 0.5 mg/mL (Non-ACS indications)    . dextrose 5 % and 0.45 % NaCl with KCl 20 mEq/L 10 mL/hr at 02/17/17 1036  . magnesium sulfate 1 - 4 g bolus IVPB     PRN Meds: sodium chloride, acetaminophen **OR** acetaminophen, alum & mag hydroxide-simeth, diphenhydrAMINE, guaiFENesin-dextromethorphan, hydrALAZINE, labetalol, magnesium sulfate 1 - 4 g bolus IVPB, metoprolol tartrate, morphine injection, ondansetron, oxyCODONE, phenol, potassium chloride   Vital Signs    Vitals:   02/19/17 0005 02/19/17 0400 02/19/17 0700 02/19/17 0800  BP:  135/65  136/78  Pulse: (!) 43 (!) 29 (!) 50 62  Resp: 17 17 20 19   Temp:  98.2 F (36.8 C)  98.3 F (36.8 C)  TempSrc:  Oral  Oral  SpO2: 95% 95% 96% 96%  Weight:      Height:        Intake/Output Summary (Last 24 hours) at 02/19/2017 0937 Last data filed at 02/19/2017 0700 Gross per 24 hour  Intake -  Output 1200 ml  Net -1200 ml   Filed Weights   02/15/17 1409 02/16/17 0311  Weight: 289 lb (131.1 kg) 288 lb 12.8 oz (131 kg)     Physical Exam  Well developed and Morbidly obese in no acute distress HENT normal Neck supple Clear Irregularly irregular rate and rhythm with controlled ventricular response, no murmurs or gallops Abd-soft with active BS without hepatomegaly No Clubbing cyanosis  tredema Skin-warm and dry A & Oriented  Grossly normal sensory and motor function   Labs    Chemistry Recent Labs  Lab 02/15/17 1337 02/15/17 1350 02/17/17 0216  NA 137 139 138  K 3.4* 3.9 3.8  CL 104 102 104  CO2 24  --  27  GLUCOSE 143* 141* 141*  BUN 6 8 9   CREATININE 0.90 0.70 0.85  CALCIUM 8.6*  --  8.0*  PROT 7.6  --   --   ALBUMIN 3.5  --   --   AST 21  --   --   ALT 15  --   --   ALKPHOS 97  --   --   BILITOT 0.8  --   --   GFRNONAA >60  --  >60  GFRAA >60  --  >60  ANIONGAP 9  --  7     Hematology Recent Labs  Lab 02/17/17 0216 02/18/17 0233 02/19/17 0154  WBC 11.4* 8.3  9.5  RBC 3.80* 3.75* 3.95  HGB 12.2 12.2 12.8  HCT 37.9 37.7 39.4  MCV 99.7 100.5* 99.7  MCH 32.1 32.5 32.4  MCHC 32.2 32.4 32.5  RDW 13.4 13.7 13.5  PLT 144* 147* 153    Cardiac EnzymesNo results for input(s): TROPONINI in the last 168 hours.  Recent Labs  Lab 02/15/17 1348  TROPIPOC 0.00     BNPNo results for input(s): BNP, PROBNP in the last 168 hours.   DDimer No results for input(s): DDIMER in the last 168 hours.   Radiology    No results found.   Telemetry    Rate controlled afib  - Personally Reviewed  ECG    No new tracings - Personally Reviewed   Cardiac Studies   none    Assessment & Plan    1. Right popliteal embolism   - continue coumadin  bivalarudin now off, Await repeat INR   2. Permanent atrial fibrillation, rate controlled - home medications include 300 mg diltiazem and lopressor 12.5 mg BID   3. Positive lupus anticoagulant - continue AC   4. HTN   Await f/u INR BP  reasonable-- nocturnal bradycardia but none during the day -- will increase lopressor 12.5>>25

## 2017-02-19 NOTE — Progress Notes (Addendum)
  Progress Note    02/19/2017 7:06 AM 4 Days Post-Op  Subjective:  No complaints; soreness is improving; asked [place] to look at her back-says it is itching.  Afebrile HR 50's-70's Afib 78'L-381'O systolic 17% RA  Vitals:   02/19/17 0005 02/19/17 0400  BP:  135/65  Pulse: (!) 43 (!) 29  Resp: 17 17  Temp:  98.2 F (36.8 C)  SpO2: 95% 95%    Physical Exam: Cardiac:  irregular Lungs:  Non labored Incisions:  Clean and dry  Extremities:  Easily palpable right DP pulse; calf is soft/non tender Back:  Severe red rash on back.  CBC    Component Value Date/Time   WBC 9.5 02/19/2017 0154   RBC 3.95 02/19/2017 0154   HGB 12.8 02/19/2017 0154   HCT 39.4 02/19/2017 0154   PLT 153 02/19/2017 0154   MCV 99.7 02/19/2017 0154   MCH 32.4 02/19/2017 0154   MCHC 32.5 02/19/2017 0154   RDW 13.5 02/19/2017 0154   LYMPHSABS 1.1 02/15/2017 1337   MONOABS 0.5 02/15/2017 1337   EOSABS 0.1 02/15/2017 1337   BASOSABS 0.0 02/15/2017 1337    BMET    Component Value Date/Time   NA 138 02/17/2017 0216   K 3.8 02/17/2017 0216   CL 104 02/17/2017 0216   CO2 27 02/17/2017 0216   GLUCOSE 141 (H) 02/17/2017 0216   BUN 9 02/17/2017 0216   CREATININE 0.85 02/17/2017 0216   CALCIUM 8.0 (L) 02/17/2017 0216   GFRNONAA >60 02/17/2017 0216   GFRAA >60 02/17/2017 0216    INR    Component Value Date/Time   INR 4.38 (HH) 02/19/2017 0154   INR 2.2 05/07/2010 1039     Intake/Output Summary (Last 24 hours) at 02/19/2017 0706 Last data filed at 02/19/2017 0405 Gross per 24 hour  Intake 240 ml  Output 1250 ml  Net -1010 ml     Assessment:  71 y.o. female is s/p:  Right popliteal embolectomy   4 Days Post-Op  Plan: -pt continues to do well this morning with palpable right DP pulse -DVT prophylaxis:  angiomax to coumadin bridge-INR today is 4.38.  Day 3 of 5 of overlap. -continue mobilization.  Dr. Scot Dock discontinued angiomax earlier and would like to restart today at 2pm.     -anticipate discharge in the next couple of days. -(pt has INR checked at [facility]) -rash-pt did receive contrast with CTA on 02/15/17--possible allergic rxn? will order Benadryl for rash.  Pt concerned it could be the bed sheets-discussed with charge RN..will order cotton sheets and gown for pt.   Leontine Locket, PA-C [facility] [phone] 02/19/2017 7:06 AM  I have interviewed the patient and examined the patient. I agree with the findings by the PA. Palpable DP pulse on the right.  She does have a rash on her back that looks like an allergic reaction but the etiology is not clear.  Gae Gallop, MD [phone]

## 2017-02-19 NOTE — Progress Notes (Signed)
ANTICOAGULATION CONSULT NOTE - Follow Up Consult  Pharmacy Consult for Warfarin and Angiomax Indication: R popliteal embolus; hx stroke, lupus anticoagulant +, PE/DVT, Afib  Allergies  Allergen Reactions  . Heparin Hives    Patient Measurements: Height: 5\' 6"  (167.6 cm) Weight: 288 lb 12.8 oz (131 kg) IBW/kg (Calculated) : 59.3  Vital Signs: Temp: 98 F (36.7 C) (11/25 1216) Temp Source: Oral (11/25 1216) BP: 116/76 (11/25 1216) Pulse Rate: 89 (11/25 1216)  Labs: Recent Labs    02/17/17 0216 02/18/17 0233 02/19/17 0154 02/19/17 1151  HGB 12.2 12.2 12.8  --   HCT 37.9 37.7 39.4  --   PLT 144* 147* 153  --   APTT 81* 81* 92*  --   LABPROT 37.1* 37.9* 41.5* 18.8*  INR 3.79 3.90 4.38* 1.59  CREATININE 0.85  --   --   --     Estimated Creatinine Clearance: 84.3 mL/min (by C-G formula based on SCr of 0.85 mg/dL).  Medications: Angiomax  @ 0.25mg /kg/hr  Assessment: 71 y/o female who presented to the ED with RLE numbness. CT demonstrated right popliteal thrombus and she was taken emergently to the OR for embolectomy. She takes warfarin PTA but INR was subtherapeutic at 1.43. Angiomax started post-op (hx HIT) and warfarin resumed 11/21.   Today is day #5/5 overlap. APTT slightly elevated at 92 seconds this morning. INR up to 4.38 on angiomax. Angiomax turned off and INR recheck ~4 hours later is below goal at 1.59. Angiomax will be resumed, but not until 1400 per Dr. Scot Dock. Will also increase warfarin dose given slow trend up.   PTA dose per last clinic visit: 6mg  daily except 4.5mg  Sunday/Tuesday/Thursday  Goal of Therapy:  INR 2.5-3.5 per Dr. Rayann Heman but will need to aim higher 3.5-4ish while on angiomax aPTT 50-85 seconds Monitor platelets by anticoagulation protocol: Yes   Plan:  1) Warfarin 8mg  tonight 2) At 1400, resume angiomax @ 0.25mg /kg/hr 3) Check 8 hour APTT   Nena Jordan, PharmD, BCPS 02/19/2017 12:41 PM

## 2017-02-20 ENCOUNTER — Telehealth: Payer: Self-pay | Admitting: Vascular Surgery

## 2017-02-20 DIAGNOSIS — I481 Persistent atrial fibrillation: Secondary | ICD-10-CM

## 2017-02-20 LAB — CBC
HCT: 40.2 % (ref 36.0–46.0)
HEMOGLOBIN: 13 g/dL (ref 12.0–15.0)
MCH: 32.7 pg (ref 26.0–34.0)
MCHC: 32.3 g/dL (ref 30.0–36.0)
MCV: 101.3 fL — ABNORMAL HIGH (ref 78.0–100.0)
PLATELETS: 162 10*3/uL (ref 150–400)
RBC: 3.97 MIL/uL (ref 3.87–5.11)
RDW: 13.8 % (ref 11.5–15.5)
WBC: 8.6 10*3/uL (ref 4.0–10.5)

## 2017-02-20 LAB — BASIC METABOLIC PANEL
Anion gap: 8 (ref 5–15)
BUN: 15 mg/dL (ref 6–20)
CALCIUM: 8.5 mg/dL — AB (ref 8.9–10.3)
CO2: 29 mmol/L (ref 22–32)
Chloride: 102 mmol/L (ref 101–111)
Creatinine, Ser: 1.01 mg/dL — ABNORMAL HIGH (ref 0.44–1.00)
GFR calc Af Amer: >60 mL/min (ref >60)
GFR, EST NON AFRICAN AMERICAN: 55 mL/min — AB (ref >60)
GLUCOSE: 104 mg/dL — AB (ref 65–99)
Potassium: 3.7 mmol/L (ref 3.5–5.1)
Sodium: 139 mmol/L (ref 135–145)

## 2017-02-20 LAB — PROTIME-INR
INR: 4.11 — AB
PROTHROMBIN TIME: 39.3 s — AB (ref 11.4–15.2)

## 2017-02-20 LAB — APTT
APTT: 93 s — AB (ref 24–36)
APTT: 73 s — AB (ref 24–36)
APTT: 71 s — AB (ref 24–36)

## 2017-02-20 MED ORDER — WARFARIN SODIUM 10 MG PO TABS
10.0000 mg | ORAL_TABLET | Freq: Once | ORAL | Status: AC
  Administered 2017-02-20: 10 mg via ORAL
  Filled 2017-02-20: qty 1

## 2017-02-20 MED ORDER — BIVALIRUDIN TRIFLUOROACETATE 250 MG IV SOLR
0.1000 mg/kg/h | INTRAVENOUS | Status: DC
  Administered 2017-02-20 (×2): 0.1 mg/kg/h via INTRAVENOUS
  Filled 2017-02-20: qty 250

## 2017-02-20 NOTE — Telephone Encounter (Signed)
Sched appt 03/16/17 at 11:15. No vm, mailed appt letter.

## 2017-02-20 NOTE — Progress Notes (Addendum)
[facility]  VASCULAR SURGERY ASSESSMENT & PLAN:   INR is 4.1.  This is a 4 of a 5-day overlap of Angiomax.  The goal as per cardiology is for her INR to be between 2.5 and 3.5.  She has a palpable right dorsalis pedis pulse.  Her ABI was 61% on the left.  For this reason Dr. Trula Slade ordered a duplex scan of the left lower extremity.  I did review these images and there is an abnormality at the bifurcation of the left common femoral artery that could potentially be a thrombus.  There is lots of flow around this and it is difficult to tell if this is acute or chronic.  However she is asymptomatic.  Therefore this is not something that I would be too concerned about at this point.  [name], MD, FACS Beeper [phone] Office: [phone]  Subjective  - Doing well over all, no new complaints.   Objective (!) 151/63 61 98.3 F (36.8 C) (Oral) 18 97%  Intake/Output Summary (Last 24 hours) at 02/20/2017 0721 Last data filed at 02/19/2017 1907 Gross per 24 hour  Intake 940 ml  Output 1200 ml  Net -260 ml    Doppler PT/DP B Right leg incision healing well Heart IR A fib Lungs non labored breathing  Assessment/Planning: POD # 5  Right popliteal embolectomy Angiomax  @ 0.25mg /kg/hr Coumadin re started   Pending therapeutic INR for discharge   [name] 02/20/2017 7:21 AM --  Laboratory Lab Results: Recent Labs    02/19/17 0154 02/20/17 0341  WBC 9.5 8.6  HGB 12.8 13.0  HCT 39.4 40.2  PLT 153 162   BMET No results for input(s): NA, K, CL, CO2, GLUCOSE, BUN, CREATININE, CALCIUM in the last 72 hours.  COAG Lab Results  Component Value Date   INR 4.11 (HH) 02/20/2017   INR 1.59 02/19/2017   INR 4.38 (HH) 02/19/2017   PROTIME 18.4 09/12/2008   PROTIME 27.4 08/29/2008   No results found for: PTT

## 2017-02-20 NOTE — Telephone Encounter (Signed)
-----   Message from Mena Goes, RN sent at 02/17/2017  6:00 PM EST ----- Regarding: 2-3 weeks with Dr. Oneida Alar   ----- Message ----- From: Natividad Brood Sent: 02/17/2017   3:44 PM To: Vvs Charge Pool  S/p pop embolectomy.  F/u with Dr. Oneida Alar in a couple of weeks.  Thanks

## 2017-02-20 NOTE — Progress Notes (Addendum)
ANTICOAGULATION CONSULT NOTE - Follow Up Consult  Pharmacy Consult for Warfarin and Angiomax Indication: R popliteal embolus; hx stroke, lupus anticoagulant +, PE/DVT, Afib  Allergies  Allergen Reactions  . Heparin Hives    Patient Measurements: Height: 5\' 6"  (167.6 cm) Weight: 288 lb 12.8 oz (131 kg) IBW/kg (Calculated) : 59.3  Vital Signs: Temp: 97.9 F (36.6 C) (11/26 0900) Temp Source: Oral (11/26 0900) BP: 132/79 (11/26 0900) Pulse Rate: 90 (11/26 0900)  Labs: Recent Labs    02/18/17 0233 02/19/17 0154 02/19/17 1151 02/19/17 2212 02/20/17 0341  HGB 12.2 12.8  --   --  13.0  HCT 37.7 39.4  --   --  40.2  PLT 147* 153  --   --  162  APTT 81* 92*  --  91* 93*  LABPROT 37.9* 41.5* 18.8*  --  39.3*  INR 3.90 4.38* 1.59  --  4.11*    Estimated Creatinine Clearance: 84.3 mL/min (by C-G formula based on SCr of 0.85 mg/dL).  Medications: Angiomax  @ 0.12mg /kg/hr  Assessment: 71 y/o female who presented to the ED with RLE numbness. CT demonstrated right popliteal thrombus and she was taken emergently to the OR for embolectomy.   She is on warfarin PTA but INR was subtherapeutic on admission at 1.43. PTA dose per last clinic visit: 6mg  daily except 4.5mg  on  Sunday/Tuesday/Thursday   Angiomax started post-op and warfarin resumed 11/22 (previously reported as restarted on 11/21 which is incorrect- no doses were ordered or given on 11/21). 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  Yesterday Angiomax was turned off and INR recheck ~4 hours later was below goal at 1.59 (prior to this, INR was 4.38 with Angiomax at a higher rate than it is currently). APTT  remains slightly elevated at 93 seconds this morning, 73 seconds after rate decrease. INR up to 4.11- this is on Angiomax and is therefore falsely elevated, thought it is difficult to compare to yesterday's INR value while on Angiomax since rate of infusion was higher at the time of that blood draw and therefore would be affecting INR more. Still very much doubt that INR is within goal range today.  Goal of Therapy:  INR 2.5-3.5 per Dr. Rayann Heman aPTT 50-85 seconds Monitor platelets by anticoagulation protocol: Yes   Plan:  - Decrease Angiomax to 0.1mg /kg/hr (~20% decrease) - Recheck aPTT in 2h - Warfarin 10mg  tonight - Daily INR and aPTT  - Discussed case with Dr. Acie Fredrickson...will recheck HIT Antibody to get more definitive diagnosis during this admission since historical lab tests are unable to be viewed   Mersadies Petree D. Kori Colin, PharmD, [place] Clinical Pharmacist Clinical Phone for 02/20/2017 until 3:30pm: [phone] If after 3:30pm, please call main pharmacy at [phone] 02/20/2017 11:38 AM    ADDENDUM Recheck aPTT is in range at 71 seconds. HIT antibody in process.  Continue Angiomax at 0.1mg /kg/hr Daily aPTT and INR  Omere Marti D. Deshawn Skelley, PharmD, BCPS Clinical Pharmacist 02/20/2017 4:04 PM

## 2017-02-20 NOTE — Progress Notes (Addendum)
Progress Note  Patient Name: [name] Date of Encounter: 02/20/2017  Primary Cardiologist: Dr. Vita Barley   Subjective   No chest pain and no SOB, leg feels fine.   Inpatient Medications    Scheduled Meds: . aspirin  81 mg Oral Daily  . cholecalciferol  1,000 Units Oral Daily  . diltiazem  300 mg Oral Daily  . docusate sodium  100 mg Oral Daily  . furosemide  40 mg Oral Daily  . metoprolol tartrate  12.5 mg Oral BID  . pantoprazole  40 mg Oral Daily  . potassium chloride  10 mEq Oral Daily  . Warfarin - Pharmacist Dosing Inpatient   Does not apply q1800   Continuous Infusions: . sodium chloride    . bivalirudin (ANGIOMAX) infusion 0.5 mg/mL (Non-ACS indications) 0.12 mg/kg/hr (02/20/17 0511)  . dextrose 5 % and 0.45 % NaCl with KCl 20 mEq/L 10 mL/hr at 02/17/17 1036  . magnesium sulfate 1 - 4 g bolus IVPB     PRN Meds: sodium chloride, acetaminophen **OR** acetaminophen, alum & mag hydroxide-simeth, diphenhydrAMINE, guaiFENesin-dextromethorphan, hydrALAZINE, labetalol, magnesium sulfate 1 - 4 g bolus IVPB, metoprolol tartrate, morphine injection, ondansetron, oxyCODONE, phenol, potassium chloride   Vital Signs    Vitals:   02/19/17 2140 02/19/17 2222 02/20/17 0000 02/20/17 0424  BP: 124/75  128/75 (!) 151/63  Pulse:  77  61  Resp: 17  17 18   Temp: 98.2 F (36.8 C)  98.8 F (37.1 C) 98.3 F (36.8 C)  TempSrc: Oral  Oral Oral  SpO2:   96% 97%  Weight:      Height:        Intake/Output Summary (Last 24 hours) at 02/20/2017 0857 Last data filed at 02/19/2017 1907 Gross per 24 hour  Intake 700 ml  Output 1200 ml  Net -500 ml   Filed Weights   02/15/17 1409 02/16/17 0311  Weight: 289 lb (131.1 kg) 288 lb 12.8 oz (131 kg)    Telemetry    A fib rate controlled - Personally Reviewed  ECG    No new - Personally Reviewed  Physical Exam   GEN: No acute distress.   Neck: No JVD sitting up in chair Cardiac: irreg irreg, no murmurs, rubs, or  gallops.  Respiratory: Clear to auscultation bilaterally. GI: Soft, nontender, non-distended  MS: No edema; No deformity. Rt leg incision stable Neuro:  Nonfocal  Psych: Normal affect   Labs    Chemistry Recent Labs  Lab 02/15/17 1337 02/15/17 1350 02/17/17 0216  NA 137 139 138  K 3.4* 3.9 3.8  CL 104 102 104  CO2 24  --  27  GLUCOSE 143* 141* 141*  BUN 6 8 9   CREATININE 0.90 0.70 0.85  CALCIUM 8.6*  --  8.0*  PROT 7.6  --   --   ALBUMIN 3.5  --   --   AST 21  --   --   ALT 15  --   --   ALKPHOS 97  --   --   BILITOT 0.8  --   --   GFRNONAA >60  --  >60  GFRAA >60  --  >60  ANIONGAP 9  --  7     Hematology Recent Labs  Lab 02/18/17 0233 02/19/17 0154 02/20/17 0341  WBC 8.3 9.5 8.6  RBC 3.75* 3.95 3.97  HGB 12.2 12.8 13.0  HCT 37.7 39.4 40.2  MCV 100.5* 99.7 101.3*  MCH 32.5 32.4 32.7  MCHC 32.4  32.5 32.3  RDW 13.7 13.5 13.8  PLT 147* 153 162    Cardiac EnzymesNo results for input(s): TROPONINI in the last 168 hours.  Recent Labs  Lab 02/15/17 1348  TROPIPOC 0.00     BNPNo results for input(s): BNP, PROBNP in the last 168 hours.   DDimer No results for input(s): DDIMER in the last 168 hours.   Radiology    No results found.  Cardiac Studies   none  Patient Profile     71 y.o. female with a history of permanent atrial fibrillation on chronic coumadin, complicated by lupus anticoagulant and prior embolic events including DVT, IVC filter, bilat PE (2007), bilat femoral artery emboli (5573), embolic CVA (2202), and HIT,who is admitted with R popliteal embolus s/p embolectomy and permanent a fib.  Assessment & Plan    Right popliteal embolism- continue coumadin now of bivalarudin,  INR today 4.11, labile INR. (INR on admit 1.43-PCP follows and had been subtherapeutic)  Permanent atrial fib on 300 mg dilt, and lopressor 12.5 mg BID rate 80s  Positive lupus anticoagulant -on coumadin  HTN 124/75 to 151/63 on dilt 300, metoprolol 12.5 bid    For questions or updates, please contact [facility] Please consult [email or web address] for contact info under Cardiology/STEMI.      Signed, Cecilie Kicks, NP  02/20/2017, 8:57 AM    Attending Note:   The patient was seen and examined.  Agree with assessment and plan as noted above.  Changes made to the above note as needed.  Patient seen and independently examined with Cecilie Kicks, NP .   We discussed all aspects of the encounter. I agree with the assessment and plan as stated above.  1.   Right popliteal embolus: The patient has a history of antiphospholipid syndrome.  She has been on Coumadin but she had multiple embolic events.  I think that she would likely do better with Xarelto.  We will ask  Dr.Granfortuna to weigh in on this ( if he is available )  .        I have spent a total of 30 minutes with patient reviewing hospital  notes , telemetry, EKGs, labs and examining patient as well as establishing an assessment and plan that was discussed with the patient. > 50% of time was spent in direct patient care.    Thayer Headings, Brooke Bonito., MD, [facility] 02/20/2017, 10:26 AM [address],  [address] Office [phone] Pager [phone]  Addendum 5:58 PM  I have discussed the case with Dr. Beryle Beams who recommends continuing with coumadin stating that [place] do not seem to be adequate in patients with Antiphospholipid syndrome.    Mertie Moores, MD  02/20/2017 5:59 PM    [address],  [address] [place], [place]  [address] Pager [phone] Phone: [phone]; Fax: [phone]

## 2017-02-20 NOTE — Progress Notes (Signed)
Physical Therapy Treatment Patient Details Name: [name] MRN: [id number] DOB: [birth year] Today's Date: 02/20/2017    History of Present Illness Pt adm on 02/15/17 with ischemic RLE. Underwent Right popliteal embolectomy on 02/16/17. PMH - afib, rt cva, dvt, PE, IVC filter.   PT Comments    Pt has progressed well with mobility. Mod indep for ambulation with RW and ascend/descending stairs with rails. Amb in room with no AD, with pt reliant on UE support to maintain balance. States she will plan to use RW initially at home. Plans to d/c to daughter's home for initial support before returning to her own home. Has met short-term acute PT goals. All questions answered and education completed. D/c acute PT.   Follow Up Recommendations  No PT follow up     Equipment Recommendations  None recommended by PT    Recommendations for Other Services       Precautions / Restrictions Precautions Precautions: Fall Restrictions Weight Bearing Restrictions: No    Mobility  Bed Mobility               General bed mobility comments: Pt received in chair; reports no issues with bed mob  Transfers Overall transfer level: Modified independent Equipment used: None;Rolling walker (2 wheeled) Transfers: Sit to/from Stand              Ambulation/Gait Ambulation/Gait assistance: Modified independent (Device/Increase time) Ambulation Distance (Feet): 400 Feet Assistive device: Rolling walker (2 wheeled);None   Gait velocity: Decreased   General Gait Details: Mod indep amb with RW. Amb in room with no AD, and pt reliant on UE support to steady balance; recommend use of RW for now and pt agrees   Stairs Stairs: Yes   Stair Management: Two rails;Step to pattern;Forwards Number of Stairs: 4 General stair comments: Mod indep with BUE support on rails; pt will have supervision from daughter if needed  Wheelchair Mobility    Modified Rankin (Stroke Patients Only)        Balance Overall balance assessment: Needs assistance   Sitting balance-Leahy Scale: Good       Standing balance-Leahy Scale: Fair                              Cognition Arousal/Alertness: Awake/alert Behavior During Therapy: WFL for tasks assessed/performed Overall Cognitive Status: Within Functional Limits for tasks assessed                                        Exercises      General Comments        Pertinent Vitals/Pain Pain Assessment: No/denies pain    Home Living                      Prior Function            PT Goals (current goals can now be found in the care plan section) Acute Rehab PT Goals Patient Stated Goal: Return home PT Goal Formulation: With patient Time For Goal Achievement: 02/23/17 Potential to Achieve Goals: Good Progress towards PT goals: Goals met/education completed, patient discharged from PT    Frequency    Min 3X/week      PT Plan Current plan remains appropriate    Co-evaluation              AM-PAC PT "  6 Clicks" Daily Activity  Outcome Measure  Difficulty turning over in bed (including adjusting bedclothes, sheets and blankets)?: None Difficulty moving from lying on back to sitting on the side of the bed? : None Difficulty sitting down on and standing up from a chair with arms (e.g., wheelchair, bedside commode, etc,.)?: None Help needed moving to and from a bed to chair (including a wheelchair)?: None Help needed walking in hospital room?: None Help needed climbing 3-5 steps with a railing? : None 6 Click Score: 24    End of Session Equipment Utilized During Treatment: Gait belt Activity Tolerance: Patient tolerated treatment well Patient left: in chair;with call bell/phone within reach Nurse Communication: Mobility status PT Visit Diagnosis: Other abnormalities of gait and mobility (R26.89)     Time: 1314-3888 PT Time Calculation (min) (ACUTE ONLY): 32 min  Charges:   $Gait Training: 23-37 mins                    G Codes:      [name], PT, DPT Acute Rehab Services  Pager: [place] 02/20/2017, 11:19 AM

## 2017-02-20 NOTE — Progress Notes (Signed)
ANTICOAGULATION CONSULT NOTE - Follow Up Consult  Pharmacy Consult for Angiomax Indication: R popliteal embolus; hx stroke, lupus anticoagulant +, PE/DVT, Afib  Allergies  Allergen Reactions  . Heparin Hives    Patient Measurements: Height: 5\' 6"  (167.6 cm) Weight: 288 lb 12.8 oz (131 kg) IBW/kg (Calculated) : 59.3  Vital Signs: Temp: 98.3 F (36.8 C) (11/26 0424) Temp Source: Oral (11/26 0424) BP: 151/63 (11/26 0424) Pulse Rate: 61 (11/26 0424)  Labs: Recent Labs    02/18/17 0233 02/19/17 0154 02/19/17 1151 02/19/17 2212 02/20/17 0341  HGB 12.2 12.8  --   --  13.0  HCT 37.7 39.4  --   --  40.2  PLT 147* 153  --   --  162  APTT 81* 92*  --  91* 93*  LABPROT 37.9* 41.5* 18.8*  --  39.3*  INR 3.90 4.38* 1.59  --  PENDING    Estimated Creatinine Clearance: 84.3 mL/min (by C-G formula based on SCr of 0.85 mg/dL).  Medications: Angiomax  @ 0.25mg /kg/hr  Assessment: 71 y/o female who presented to the ED with RLE numbness. CT demonstrated right popliteal thrombus and she was taken emergently to the OR for embolectomy. She takes warfarin PTA but INR was subtherapeutic at 1.43. Angiomax started post-op (hx HIT) and warfarin resumed 11/21.   APTT is elevated this AM, no issues per RN.   Goal of Therapy:  aPTT 50-85 seconds Monitor platelets by anticoagulation protocol: Yes   Plan:  Dec Angiomax to 0.12 mg/kg/hr 1000 aPTT  Narda Bonds, PharmD, BCPS Clinical Pharmacist Phone: [phone]

## 2017-02-20 NOTE — Care Management Important Message (Signed)
Important Message  Patient Details  Name: [name] MRN: [id number] Date of Birth: 1945-07-24   Medicare Important Message Given:  Yes    Nathen May 02/20/2017, 9:59 AM

## 2017-02-21 ENCOUNTER — Encounter (HOSPITAL_COMMUNITY): Payer: Self-pay | Admitting: Vascular Surgery

## 2017-02-21 DIAGNOSIS — D6861 Antiphospholipid syndrome: Secondary | ICD-10-CM

## 2017-02-21 LAB — BASIC METABOLIC PANEL
Anion gap: 7 (ref 5–15)
BUN: 15 mg/dL (ref 6–20)
CALCIUM: 8.3 mg/dL — AB (ref 8.9–10.3)
CHLORIDE: 104 mmol/L (ref 101–111)
CO2: 27 mmol/L (ref 22–32)
CREATININE: 0.85 mg/dL (ref 0.44–1.00)
GFR calc non Af Amer: >60 mL/min (ref >60)
Glucose, Bld: 97 mg/dL (ref 65–99)
Potassium: 3.4 mmol/L — ABNORMAL LOW (ref 3.5–5.1)
Sodium: 138 mmol/L (ref 135–145)

## 2017-02-21 LAB — CBC
HEMATOCRIT: 40.3 % (ref 36.0–46.0)
HEMOGLOBIN: 13.1 g/dL (ref 12.0–15.0)
MCH: 32.6 pg (ref 26.0–34.0)
MCHC: 32.5 g/dL (ref 30.0–36.0)
MCV: 100.2 fL — AB (ref 78.0–100.0)
Platelets: 144 10*3/uL — ABNORMAL LOW (ref 150–400)
RBC: 4.02 MIL/uL (ref 3.87–5.11)
RDW: 13.5 % (ref 11.5–15.5)
WBC: 8.6 10*3/uL (ref 4.0–10.5)

## 2017-02-21 LAB — PROTIME-INR
INR: 3.62
INR: 2.56
PROTHROMBIN TIME: 35.8 s — AB (ref 11.4–15.2)
PROTHROMBIN TIME: 27.3 s — AB (ref 11.4–15.2)

## 2017-02-21 LAB — HEPARIN INDUCED PLATELET AB (HIT ANTIBODY): Heparin Induced Plt Ab: 0.289 OD (ref 0.000–0.400)

## 2017-02-21 LAB — APTT: aPTT: 51 seconds — ABNORMAL HIGH (ref 24–36)

## 2017-02-21 MED ORDER — FUROSEMIDE 40 MG PO TABS
40.0000 mg | ORAL_TABLET | Freq: Every day | ORAL | 0 refills | Status: DC
Start: 2017-02-21 — End: 2017-03-31

## 2017-02-21 MED ORDER — POTASSIUM CHLORIDE ER 10 MEQ PO TBCR: 10.0000 meq | EXTENDED_RELEASE_TABLET | Freq: Every day | ORAL | 0 refills | Status: DC

## 2017-02-21 NOTE — Plan of Care (Signed)
Ambulating with minimal assist.

## 2017-02-21 NOTE — Progress Notes (Signed)
ANTICOAGULATION CONSULT NOTE - Follow Up Consult  Pharmacy Consult for Warfarin and Angiomax Indication: R popliteal embolus; hx stroke, lupus anticoagulant +, PE/DVT, Afib  Allergies  Allergen Reactions  . Heparin Hives    Patient Measurements: Height: 5\' 6"  (167.6 cm) Weight: 280 lb 6.8 oz (127.2 kg) IBW/kg (Calculated) : 59.3  Vital Signs: Temp: 97.9 F (36.6 C) (11/27 0739) Temp Source: Oral (11/27 0739) BP: 132/68 (11/27 0739) Pulse Rate: 57 (11/27 0739)  Labs: Recent Labs    02/19/17 0154 02/19/17 1151  02/20/17 0341 02/20/17 1004 02/20/17 1448 02/21/17 0203  HGB 12.8  --   --  13.0  --   --  13.1  HCT 39.4  --   --  40.2  --   --  40.3  PLT 153  --   --  162  --   --  144*  APTT 92*  --    < > 93* 73* 71* 51*  LABPROT 41.5* 18.8*  --  39.3*  --   --  35.8*  INR 4.38* 1.59  --  4.11*  --   --  3.62  CREATININE  --   --   --   --   --  1.01* 0.85   < > = values in this interval not displayed.    Estimated Creatinine Clearance: 82.9 mL/min (by C-G formula based on SCr of 0.85 mg/dL).  Medications: Angiomax  @ 0.1mg /kg/hr  Assessment: 71 y/o female who presented to the ED with RLE numbness. CT demonstrated right popliteal thrombus and she was taken emergently to the OR for embolectomy.   She is on warfarin PTA but INR was subtherapeutic on admission at 1.43. PTA dose per last clinic visit: 6mg  daily except 4.5mg  on  Sunday/Tuesday/Thursday   Angiomax started post-op and warfarin resumed 11/22- she has completed 5 days of overlap therapy.  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 A heparin induced thrombocytopenia antibody was drawn yesterday after discussion with Dr. Acie Fredrickson.   APTT in  goal range, but on low end at 51 seconds this morning.  INR 3.68- this is falsely elevated due to Angiomax. Since the rate of Angiomax has changed, the INR cannot be easily trended over the past few days. Spoke with RN Urban Gibson- Angiomax is being turned off this morning ~0830 so we can check an INR off of the Angiomax infusion.  Goal of Therapy:  INR 2.5-3.5 per Dr. Rayann Heman aPTT 50-85 seconds Monitor platelets by anticoagulation protocol: Yes   Plan:  - Check INR in 4 hours - Future Angiomax and warfarin plans will be based off that INR  Shaelee Forni D. Efton Thomley, PharmD, BCPS Clinical Pharmacist Clinical Phone for 02/21/2017 until 3:30pm: [phone] If after 3:30pm, please call main pharmacy at [phone] 02/21/2017 8:26 AM    ADDENDUM Recheck aPTT is in range at 71 seconds. HIT antibody in process.  Continue Angiomax at 0.1mg /kg/hr Daily aPTT and INR  Zahara Rembert D. Emarion Toral, PharmD, BCPS Clinical Pharmacist 02/21/2017 8:26 AM

## 2017-02-21 NOTE — Progress Notes (Addendum)
  Progress Note    02/21/2017 7:20 AM 6 Days Post-Op  Subjective:  Patient is anticipating discharge today.  She denies rest pain in R foot.   Vitals:   02/20/17 2332 02/21/17 0358  BP: 119/76 128/82  Pulse:    Resp:    Temp: 97.9 F (36.6 C) 97.6 F (36.4 C)  SpO2:     Physical Exam: Cardiac:  Irregular Lungs:  Clear B lung Marlee Trentman Incisions:  R LE incision with minimal sanguinous collection on dressing at distal aspect of incision; staples remain in place with skin edges approximated well Extremities:  Palpable R DP; no calf pain R Abdomen:  Soft Neurologic: A&O; moving all extremities well  CBC    Component Value Date/Time   WBC 8.6 02/21/2017 0203   RBC 4.02 02/21/2017 0203   HGB 13.1 02/21/2017 0203   HCT 40.3 02/21/2017 0203   PLT 144 (L) 02/21/2017 0203   MCV 100.2 (H) 02/21/2017 0203   MCH 32.6 02/21/2017 0203   MCHC 32.5 02/21/2017 0203   RDW 13.5 02/21/2017 0203   LYMPHSABS 1.1 02/15/2017 1337   MONOABS 0.5 02/15/2017 1337   EOSABS 0.1 02/15/2017 1337   BASOSABS 0.0 02/15/2017 1337    BMET    Component Value Date/Time   NA 138 02/21/2017 0203   K 3.4 (L) 02/21/2017 0203   CL 104 02/21/2017 0203   CO2 27 02/21/2017 0203   GLUCOSE 97 02/21/2017 0203   BUN 15 02/21/2017 0203   CREATININE 0.85 02/21/2017 0203   CALCIUM 8.3 (L) 02/21/2017 0203   GFRNONAA >60 02/21/2017 0203   GFRAA >60 02/21/2017 0203    INR    Component Value Date/Time   INR 3.62 02/21/2017 0203   INR 2.2 05/07/2010 1039     Intake/Output Summary (Last 24 hours) at 02/21/2017 0720 Last data filed at 02/20/2017 1951 Gross per 24 hour  Intake 840 ml  Output 1103 ml  Net -263 ml     Assessment/Plan:  71 y.o. female is s/p RLE popliteal embolectomy 6 Days Post-Op   Pharmacy dosing angiomax Cardiology to decide coumadin vs Xarelto Pt ok for discharge when ok with Cardiology and pharmacy  DVT prophylaxis:  INR therapeutic   [name], PA-C [facility] [phone] 02/21/2017 7:20 AM  As above.  2+ DP pulse.  Left foot well perfused. Incision healing Home today if INR therapeutic off angiomax at [place], MD [facility] [place] Office: [phone] Pager: [phone]

## 2017-02-21 NOTE — Progress Notes (Signed)
Progress Note  Patient Name: [name] Date of Encounter: 02/21/2017  Primary Cardiologist: Hochrein  Subjective   71 year old female with history of permanent atrial fibrillation and antiphospholipid antibody syndrome.  She has been on chronic Coumadin.  She has had numerous embolic events. She was admitted with right foot ischemia and was found to have a right popliteal embolus.  She is status post embolectomy.  INR is 3.62 today.  Is now off Angiomax   Inpatient Medications    Scheduled Meds: . aspirin  81 mg Oral Daily  . cholecalciferol  1,000 Units Oral Daily  . diltiazem  300 mg Oral Daily  . docusate sodium  100 mg Oral Daily  . furosemide  40 mg Oral Daily  . metoprolol tartrate  12.5 mg Oral BID  . pantoprazole  40 mg Oral Daily  . potassium chloride  10 mEq Oral Daily  . Warfarin - Pharmacist Dosing Inpatient   Does not apply q1800   Continuous Infusions: . sodium chloride    . bivalirudin (ANGIOMAX) infusion 0.5 mg/mL (Non-ACS indications) Stopped (02/21/17 0830)  . dextrose 5 % and 0.45 % NaCl with KCl 20 mEq/L 10 mL/hr at 02/17/17 1036  . magnesium sulfate 1 - 4 g bolus IVPB     PRN Meds: sodium chloride, acetaminophen **OR** acetaminophen, alum & mag hydroxide-simeth, diphenhydrAMINE, guaiFENesin-dextromethorphan, hydrALAZINE, labetalol, magnesium sulfate 1 - 4 g bolus IVPB, metoprolol tartrate, morphine injection, ondansetron, oxyCODONE, phenol, potassium chloride   Vital Signs    Vitals:   02/20/17 2332 02/21/17 0358 02/21/17 0739 02/21/17 0831  BP: 119/76 128/82 132/68   Pulse:   (!) 57 77  Resp:   19   Temp: 97.9 F (36.6 C) 97.6 F (36.4 C) 97.9 F (36.6 C)   TempSrc: Oral Oral Oral   SpO2:   98%   Weight:  280 lb 6.8 oz (127.2 kg)    Height:        Intake/Output Summary (Last 24 hours) at 02/21/2017 0958 Last data filed at 02/20/2017 1951 Gross per 24 hour  Intake 600 ml  Output 802 ml  Net -202 ml   Filed Weights   02/15/17  1409 02/16/17 0311 02/21/17 0358  Weight: 289 lb (131.1 kg) 288 lb 12.8 oz (131 kg) 280 lb 6.8 oz (127.2 kg)    Telemetry    Atrial fib  - Personally Reviewed  ECG     atrial fib  - Personally Reviewed  Physical Exam   GEN: No acute distress.  Obese female  Neck: No JVD Cardiac:  Irreg irreg.  Respiratory: Clear to auscultation bilaterally. GI: Soft, nontender, non-distended  MS: No edema; No deformity. Neuro:  Nonfocal  Psych: Normal affect   Labs    Chemistry Recent Labs  Lab 02/15/17 1337  02/17/17 0216 02/20/17 1448 02/21/17 0203  NA 137   < > 138 139 138  K 3.4*   < > 3.8 3.7 3.4*  CL 104   < > 104 102 104  CO2 24  --  27 29 27   GLUCOSE 143*   < > 141* 104* 97  BUN 6   < > 9 15 15   CREATININE 0.90   < > 0.85 1.01* 0.85  CALCIUM 8.6*  --  8.0* 8.5* 8.3*  PROT 7.6  --   --   --   --   ALBUMIN 3.5  --   --   --   --   AST 21  --   --   --   --  ALT 15  --   --   --   --   ALKPHOS 97  --   --   --   --   BILITOT 0.8  --   --   --   --   GFRNONAA >60  --  >60 55* >60  GFRAA >60  --  >60 >60 >60  ANIONGAP 9  --  7 8 7    < > = values in this interval not displayed.     Hematology Recent Labs  Lab 02/19/17 0154 02/20/17 0341 02/21/17 0203  WBC 9.5 8.6 8.6  RBC 3.95 3.97 4.02  HGB 12.8 13.0 13.1  HCT 39.4 40.2 40.3  MCV 99.7 101.3* 100.2*  MCH 32.4 32.7 32.6  MCHC 32.5 32.3 32.5  RDW 13.5 13.8 13.5  PLT 153 162 144*    Cardiac EnzymesNo results for input(s): TROPONINI in the last 168 hours.  Recent Labs  Lab 02/15/17 1348  TROPIPOC 0.00     BNPNo results for input(s): BNP, PROBNP in the last 168 hours.   DDimer No results for input(s): DDIMER in the last 168 hours.   Radiology    No results found.  Cardiac Studies      Patient Profile     71 y.o. female with AFib and antiphospholipid antibody syndrome - presented with an ischemic right foot   Assessment & Plan    1.  Antiphospholipid antibody syndrome: The patient has history  of atrial fibrillation and antiphospholipid antibody syndrome.  She has had numerous emboli. I checked with our pharmacist and our hematologist and the use of DOACS is not recommended.  I agree with an INR goal of 2.5-3.5.  She seems to be doing fairly well. I think she can be discharged later today if her INR is therapeutic off the Angiomax. He will follow-up with Dr. Percival Spanish.  We will sign off.  Please call for questions.  For questions or updates, please contact [place] Please consult [email or web address] for contact info under Cardiology/STEMI.      Signed, Mertie Moores, MD  02/21/2017, 9:58 AM

## 2017-02-21 NOTE — Discharge Summary (Signed)
Physician Discharge Summary   Patient ID: [name] [id number] 71 y.o. 1945/06/27  Admit date: 02/15/2017  Discharge date and time: 02/21/17   Admitting Physician: Elam Dutch, MD   Discharge Physician: same  Admission Diagnoses: Popliteal artery embolus [facility]) [I74.3]  Discharge Diagnoses: same PAF Antiphospholipid antibody syndrome  Admission Condition: poor  Discharged Condition: good  Indication for Admission: ischemic RLE  Hospital Course: Ms. Knapke is a 71y.o. Female who presented to the emergency department with an ischemic RLE.  She was brought emergently to the OR for right popliteal artery embolectomy by Dr. Oneida Alar.  She tolerated this procedure well and was admitted to the hospital.  Coumadin was resumed and patient was started by pharmacy on angiomax infusion bridge due to unclear history of HIT.  Cardiology was consulted for management of PAF and for anticoagulation choice.  It was recommended that patient remain on coumadin and aspirin.  Much of hospital stay consisted of titration of coumadin and 5 day overlap with angiomax.  She has not had much pain or required much wound care to R leg incision.  At time of discharge she maintains an intact incision and has palpable R DP.  She will be recommended by pharmacy to take 6mg  coumadin daily and recheck INR on Thursday or Friday 11/30.  INR at time of d/c is 2.56.  She will follow up in office in about 2-3 weeks to recheck circulation and incision.  Discharge instructions were reviewed with the patient and she voices her understanding.  She will be discharged in stable condition this afternoon.   Consults: cardiology  Treatments: surgery: R popliteal artery embolectomy 11/22 by Dr. Oneida Alar  Discharge Exam: See progress note 11/27  Disposition: Home  Patient Instructions:  Allergies as of 02/21/2017      Reactions   Heparin Hives      Medication List    TAKE these medications   acetaminophen 500 MG  tablet Commonly known as:  TYLENOL Take 1,000 mg by mouth every 6 (six) hours as needed for mild pain.   aspirin 81 MG tablet Take 81 mg by mouth daily.   CARTIA XT 300 MG 24 hr capsule Generic drug:  diltiazem TAKE 1 CAPSULE BY MOUTH ONCE DAILY   cholecalciferol 1000 units tablet Commonly known as:  VITAMIN D Take 1,000 Units by mouth daily.   furosemide 40 MG tablet Commonly known as:  LASIX Take 1 tablet (40 mg total) by mouth daily. TAKE ONE TABLET BY MOUTH ONCE DAILY. NEED OFFICE VISIT FOR ADDITIONAL REFILLS.   metoprolol tartrate 25 MG tablet Commonly known as:  LOPRESSOR Take 0.5 tablets (12.5 mg total) by mouth 2 (two) times daily. KEEP OV.   potassium chloride 10 MEQ tablet Commonly known as:  K-DUR Take 1 tablet (10 mEq total) by mouth daily.   warfarin 3 MG tablet Commonly known as:  COUMADIN Take as directed. If you are unsure how to take this medication, talk to your nurse or doctor. Original instructions:  Take 2 tablets (6 mg total) by mouth daily. - needs INR and MD appt for further refills What changed:    how much to take  how to take this  when to take this  additional instructions      Activity: activity as tolerated and no lifting, driving, or strenuous exercise for 2 weeks Diet: regular diet Wound Care: keep wound clean and dry  Follow-up with Dr. Oneida Alar in 2 weeks.  SignedDagoberto Ligas 02/21/2017 2:09 PM

## 2017-02-21 NOTE — Care Management Note (Signed)
Case Management Note [name] RN, BSN Unit 4E-Case Manager [phone]  Patient Details  Name: [name] MRN: [id number] Date of Birth: 09-25-1945  Subjective/Objective:  Pt admitted s/p RLE embolectomy                 Action/Plan: PTA pt lived at home, independent- anticipate return home- CM to follow.   Expected Discharge Date:  02/21/17               Expected Discharge Plan:  Home/Self Care  In-House Referral:  NA  Discharge planning Services  CM Consult  Post Acute Care Choice:  NA Choice offered to:  NA  DME Arranged:  N/A DME Agency:  NA  HH Arranged:  NA HH Agency:  NA  Status of Service:  Completed, signed off  If discussed at [place] of Stay Meetings, dates discussed:    Discharge Disposition: home/self care   Additional Comments:  02/21/17- [place] RN, CM-  Pt for d/c home today- no CM needs noted for discharge.   Dawayne Patricia, RN 02/21/2017, 2:28 PM

## 2017-02-21 NOTE — Progress Notes (Signed)
ANTICOAGULATION CONSULT NOTE - Follow Up Consult  Pharmacy Consult for Warfarin and Angiomax Indication: R popliteal embolus; hx stroke, lupus anticoagulant +, PE/DVT, Afib  Allergies  Allergen Reactions  . Heparin Hives    Assessment: 71 y/o female who presented to the ED with RLE numbness. CT demonstrated right popliteal thrombus and she was taken emergently to the OR for embolectomy.   She is on warfarin PTA but INR was subtherapeutic on admission at 1.43. PTA dose per last clinic visit: 6mg  daily except 4.5mg  on  Sunday/Tuesday/Thursday.  Angiomax started post-op and warfarin resumed 11/22- she has completed 5 days of overlap therapy.  INR OFF Angiomax this afternoon is in therapeutic range at 2.56. Spoke with Dr. Oneida Alar to make him aware of this result.   Goal of Therapy:  INR 2.5-3.5 per Dr. Rayann Heman aPTT 50-85 seconds Monitor platelets by anticoagulation protocol: Yes   Plan:  - Recommend discharging her on warfarin 6mg  daily with an INR check on Thursday 11/29 or Friday 11/30 - No warfarin dose ordered for tonight as anticipate discharge  Akiva Josey D. Tyriana Helmkamp, PharmD, [place] Clinical Pharmacist Clinical Phone for 02/21/2017 until 3:30pm: [phone] If after 3:30pm, please call main pharmacy at [phone] 02/21/2017 1:29 PM

## 2017-02-21 NOTE — Progress Notes (Signed)
Discussed with the patient and all questioned fully answered. She will call me if any problems arise.  IV removed. Telemetry removed. Pt given paper Rx for furosemide and potassium.  Fritz Pickerel, RN

## 2017-02-24 ENCOUNTER — Ambulatory Visit (INDEPENDENT_AMBULATORY_CARE_PROVIDER_SITE_OTHER): Payer: Medicare Other | Admitting: *Deleted

## 2017-02-24 DIAGNOSIS — I743 Embolism and thrombosis of arteries of the lower extremities: Secondary | ICD-10-CM

## 2017-02-24 DIAGNOSIS — Z7901 Long term (current) use of anticoagulants: Secondary | ICD-10-CM

## 2017-02-24 DIAGNOSIS — I2699 Other pulmonary embolism without acute cor pulmonale: Secondary | ICD-10-CM | POA: Diagnosis not present

## 2017-02-24 DIAGNOSIS — I4891 Unspecified atrial fibrillation: Secondary | ICD-10-CM | POA: Diagnosis not present

## 2017-02-24 DIAGNOSIS — Z5181 Encounter for therapeutic drug level monitoring: Secondary | ICD-10-CM

## 2017-02-24 LAB — POCT INR: INR: 3.8

## 2017-02-24 NOTE — Patient Instructions (Signed)
Today take 1 tablet then resume taking 2 tablets daily. Recheck INR in 1 week. Coumadin Clinic [phone]

## 2017-03-03 ENCOUNTER — Ambulatory Visit (INDEPENDENT_AMBULATORY_CARE_PROVIDER_SITE_OTHER): Payer: Medicare Other | Admitting: *Deleted

## 2017-03-03 DIAGNOSIS — I2699 Other pulmonary embolism without acute cor pulmonale: Secondary | ICD-10-CM | POA: Diagnosis not present

## 2017-03-03 DIAGNOSIS — I743 Embolism and thrombosis of arteries of the lower extremities: Secondary | ICD-10-CM

## 2017-03-03 DIAGNOSIS — Z5181 Encounter for therapeutic drug level monitoring: Secondary | ICD-10-CM

## 2017-03-03 DIAGNOSIS — Z7901 Long term (current) use of anticoagulants: Secondary | ICD-10-CM | POA: Diagnosis not present

## 2017-03-03 DIAGNOSIS — I4891 Unspecified atrial fibrillation: Secondary | ICD-10-CM

## 2017-03-03 LAB — POCT INR: INR: 4.7

## 2017-03-03 NOTE — Patient Instructions (Signed)
Do not take any Coumadin today then start taking 2 tablets daily except 1.5 tablets on Mondays. Start eating 4 servings of dark green leafy veggies weekly.  Recheck INR in 1 week. Coumadin Clinic [phone]

## 2017-03-04 ENCOUNTER — Emergency Department (HOSPITAL_COMMUNITY): Payer: Medicare Other

## 2017-03-04 ENCOUNTER — Encounter (HOSPITAL_COMMUNITY): Payer: Self-pay | Admitting: Emergency Medicine

## 2017-03-04 ENCOUNTER — Inpatient Hospital Stay (HOSPITAL_COMMUNITY)
Admission: EM | Admit: 2017-03-04 | Discharge: 2017-03-12 | DRG: 856 | Disposition: A | Discharge status: Home / Self Care | Payer: Medicare Other | Attending: Family Medicine | Admitting: Family Medicine

## 2017-03-04 ENCOUNTER — Other Ambulatory Visit: Payer: Self-pay

## 2017-03-04 DIAGNOSIS — N179 Acute kidney failure, unspecified: Secondary | ICD-10-CM | POA: Diagnosis present

## 2017-03-04 DIAGNOSIS — Z86718 Personal history of other venous thrombosis and embolism: Secondary | ICD-10-CM

## 2017-03-04 DIAGNOSIS — E871 Hypo-osmolality and hyponatremia: Secondary | ICD-10-CM | POA: Diagnosis present

## 2017-03-04 DIAGNOSIS — E66813 Obesity, class 3: Secondary | ICD-10-CM | POA: Diagnosis present

## 2017-03-04 DIAGNOSIS — Z888 Allergy status to other drugs, medicaments and biological substances status: Secondary | ICD-10-CM

## 2017-03-04 DIAGNOSIS — Z86711 Personal history of pulmonary embolism: Secondary | ICD-10-CM

## 2017-03-04 DIAGNOSIS — I5032 Chronic diastolic (congestive) heart failure: Secondary | ICD-10-CM | POA: Diagnosis not present

## 2017-03-04 DIAGNOSIS — L03115 Cellulitis of right lower limb: Secondary | ICD-10-CM

## 2017-03-04 DIAGNOSIS — R739 Hyperglycemia, unspecified: Secondary | ICD-10-CM | POA: Diagnosis present

## 2017-03-04 DIAGNOSIS — R6520 Severe sepsis without septic shock: Secondary | ICD-10-CM | POA: Diagnosis present

## 2017-03-04 DIAGNOSIS — T8141XA Infection following a procedure, superficial incisional surgical site, initial encounter: Secondary | ICD-10-CM | POA: Diagnosis not present

## 2017-03-04 DIAGNOSIS — I743 Embolism and thrombosis of arteries of the lower extremities: Secondary | ICD-10-CM | POA: Diagnosis not present

## 2017-03-04 DIAGNOSIS — M79609 Pain in unspecified limb: Secondary | ICD-10-CM

## 2017-03-04 DIAGNOSIS — A419 Sepsis, unspecified organism: Secondary | ICD-10-CM | POA: Diagnosis present

## 2017-03-04 DIAGNOSIS — L0390 Cellulitis, unspecified: Secondary | ICD-10-CM

## 2017-03-04 DIAGNOSIS — Z8673 Personal history of transient ischemic attack (TIA), and cerebral infarction without residual deficits: Secondary | ICD-10-CM

## 2017-03-04 DIAGNOSIS — I4821 Permanent atrial fibrillation: Secondary | ICD-10-CM | POA: Diagnosis present

## 2017-03-04 DIAGNOSIS — M7989 Other specified soft tissue disorders: Secondary | ICD-10-CM

## 2017-03-04 DIAGNOSIS — E669 Obesity, unspecified: Secondary | ICD-10-CM | POA: Diagnosis present

## 2017-03-04 DIAGNOSIS — Y848 Other medical procedures as the cause of abnormal reaction of the patient, or of later complication, without mention of misadventure at the time of the procedure: Secondary | ICD-10-CM | POA: Diagnosis present

## 2017-03-04 DIAGNOSIS — D6862 Lupus anticoagulant syndrome: Secondary | ICD-10-CM | POA: Diagnosis present

## 2017-03-04 DIAGNOSIS — Z6841 Body Mass Index (BMI) 40.0 and over, adult: Secondary | ICD-10-CM

## 2017-03-04 DIAGNOSIS — I482 Chronic atrial fibrillation: Secondary | ICD-10-CM | POA: Diagnosis present

## 2017-03-04 DIAGNOSIS — F329 Major depressive disorder, single episode, unspecified: Secondary | ICD-10-CM | POA: Diagnosis present

## 2017-03-04 DIAGNOSIS — B9562 Methicillin resistant Staphylococcus aureus infection as the cause of diseases classified elsewhere: Secondary | ICD-10-CM | POA: Diagnosis present

## 2017-03-04 DIAGNOSIS — R000 Tachycardia, unspecified: Secondary | ICD-10-CM | POA: Diagnosis not present

## 2017-03-04 DIAGNOSIS — Z7901 Long term (current) use of anticoagulants: Secondary | ICD-10-CM

## 2017-03-04 DIAGNOSIS — I4891 Unspecified atrial fibrillation: Secondary | ICD-10-CM | POA: Diagnosis not present

## 2017-03-04 DIAGNOSIS — I110 Hypertensive heart disease with heart failure: Secondary | ICD-10-CM | POA: Diagnosis present

## 2017-03-04 DIAGNOSIS — E878 Other disorders of electrolyte and fluid balance, not elsewhere classified: Secondary | ICD-10-CM | POA: Diagnosis present

## 2017-03-04 LAB — COMPREHENSIVE METABOLIC PANEL
ALK PHOS: 95 U/L (ref 38–126)
ALT: 14 U/L (ref 14–54)
ANION GAP: 9 (ref 5–15)
AST: 18 U/L (ref 15–41)
Albumin: 2.9 g/dL — ABNORMAL LOW (ref 3.5–5.0)
BILIRUBIN TOTAL: 2.1 mg/dL — AB (ref 0.3–1.2)
BUN: 12 mg/dL (ref 6–20)
CALCIUM: 8.4 mg/dL — AB (ref 8.9–10.3)
CO2: 26 mmol/L (ref 22–32)
Chloride: 97 mmol/L — ABNORMAL LOW (ref 101–111)
Creatinine, Ser: 1.16 mg/dL — ABNORMAL HIGH (ref 0.44–1.00)
GFR calc non Af Amer: 46 mL/min — ABNORMAL LOW (ref >60)
GFR, EST AFRICAN AMERICAN: 54 mL/min — AB (ref >60)
GLUCOSE: 205 mg/dL — AB (ref 65–99)
Potassium: 3.9 mmol/L (ref 3.5–5.1)
Sodium: 132 mmol/L — ABNORMAL LOW (ref 135–145)
TOTAL PROTEIN: 7.3 g/dL (ref 6.5–8.1)

## 2017-03-04 LAB — PROTIME-INR
INR: 3.27
PROTHROMBIN TIME: 33 s — AB (ref 11.4–15.2)

## 2017-03-04 LAB — LACTIC ACID, PLASMA
Lactic Acid, Venous: 1.4 mmol/L (ref 0.5–1.9)
Lactic Acid, Venous: 1.4 mmol/L (ref 0.5–1.9)

## 2017-03-04 LAB — CBC WITH DIFFERENTIAL/PLATELET
BASOS PCT: 0 %
Basophils Absolute: 0 10*3/uL (ref 0.0–0.1)
EOS ABS: 0.1 10*3/uL (ref 0.0–0.7)
EOS PCT: 0 %
HCT: 41.8 % (ref 36.0–46.0)
HEMOGLOBIN: 13.9 g/dL (ref 12.0–15.0)
Lymphocytes Relative: 5 %
Lymphs Abs: 1.1 10*3/uL (ref 0.7–4.0)
MCH: 33.1 pg (ref 26.0–34.0)
MCHC: 33.3 g/dL (ref 30.0–36.0)
MCV: 99.5 fL (ref 78.0–100.0)
MONOS PCT: 6 %
Monocytes Absolute: 1.5 10*3/uL — ABNORMAL HIGH (ref 0.1–1.0)
NEUTROS PCT: 89 %
Neutro Abs: 20.2 10*3/uL — ABNORMAL HIGH (ref 1.7–7.7)
PLATELETS: 171 10*3/uL (ref 150–400)
RBC: 4.2 MIL/uL (ref 3.87–5.11)
RDW: 13.6 % (ref 11.5–15.5)
WBC: 22.8 10*3/uL — AB (ref 4.0–10.5)

## 2017-03-04 LAB — I-STAT CG4 LACTIC ACID, ED
Lactic Acid, Venous: 1.85 mmol/L (ref 0.5–1.9)
Lactic Acid, Venous: 2.87 mmol/L (ref 0.5–1.9)

## 2017-03-04 MED ORDER — POTASSIUM CHLORIDE CRYS ER 10 MEQ PO TBCR
10.0000 meq | EXTENDED_RELEASE_TABLET | Freq: Every day | ORAL | Status: DC
  Filled 2017-03-04: qty 1

## 2017-03-04 MED ORDER — FUROSEMIDE 40 MG PO TABS: 40.0000 mg | ORAL_TABLET | Freq: Every day | ORAL | Status: DC

## 2017-03-04 MED ORDER — WARFARIN - PHARMACIST DOSING INPATIENT: Freq: Every day | Status: DC

## 2017-03-04 MED ORDER — PIPERACILLIN-TAZOBACTAM 3.375 G IVPB 30 MIN
3.3750 g | Freq: Once | INTRAVENOUS | Status: AC
  Administered 2017-03-04: 3.375 g via INTRAVENOUS
  Filled 2017-03-04: qty 50

## 2017-03-04 MED ORDER — ASPIRIN EC 81 MG PO TBEC
81.0000 mg | DELAYED_RELEASE_TABLET | Freq: Every day | ORAL | Status: DC
  Administered 2017-03-04 – 2017-03-12 (×9): 81 mg via ORAL
  Filled 2017-03-04 (×9): qty 1

## 2017-03-04 MED ORDER — VANCOMYCIN HCL IN DEXTROSE 1-5 GM/200ML-% IV SOLN
1000.0000 mg | Freq: Once | INTRAVENOUS | Status: DC
  Filled 2017-03-04: qty 200

## 2017-03-04 MED ORDER — ACETAMINOPHEN 500 MG PO TABS
1000.0000 mg | ORAL_TABLET | Freq: Four times a day (QID) | ORAL | Status: DC | PRN
  Administered 2017-03-08 – 2017-03-10 (×2): 1000 mg via ORAL
  Filled 2017-03-04 (×2): qty 2

## 2017-03-04 MED ORDER — SODIUM CHLORIDE 0.9 % IV SOLN
INTRAVENOUS | Status: DC
  Administered 2017-03-04: 16:00:00 via INTRAVENOUS

## 2017-03-04 MED ORDER — DILTIAZEM HCL ER COATED BEADS 180 MG PO CP24
300.0000 mg | ORAL_CAPSULE | Freq: Every day | ORAL | Status: DC
  Administered 2017-03-04 – 2017-03-12 (×9): 300 mg via ORAL
  Filled 2017-03-04 (×10): qty 1

## 2017-03-04 MED ORDER — VITAMIN D 1000 UNITS PO TABS
1000.0000 [IU] | ORAL_TABLET | Freq: Every day | ORAL | Status: DC
  Administered 2017-03-04 – 2017-03-12 (×9): 1000 [IU] via ORAL
  Filled 2017-03-04 (×9): qty 1

## 2017-03-04 MED ORDER — PIPERACILLIN-TAZOBACTAM 3.375 G IVPB
3.3750 g | Freq: Three times a day (TID) | INTRAVENOUS | Status: DC
  Administered 2017-03-04 – 2017-03-08 (×13): 3.375 g via INTRAVENOUS
  Filled 2017-03-04 (×15): qty 50

## 2017-03-04 MED ORDER — METOPROLOL TARTRATE 12.5 MG HALF TABLET
12.5000 mg | ORAL_TABLET | Freq: Two times a day (BID) | ORAL | Status: DC
  Administered 2017-03-04 – 2017-03-12 (×13): 12.5 mg via ORAL
  Filled 2017-03-04 (×15): qty 1

## 2017-03-04 MED ORDER — WARFARIN SODIUM 3 MG PO TABS
6.0000 mg | ORAL_TABLET | Freq: Once | ORAL | Status: AC
  Administered 2017-03-04: 6 mg via ORAL
  Filled 2017-03-04: qty 2
  Filled 2017-03-04: qty 1

## 2017-03-04 MED ORDER — VANCOMYCIN HCL 10 G IV SOLR
2000.0000 mg | Freq: Once | INTRAVENOUS | Status: AC
  Administered 2017-03-04: 2000 mg via INTRAVENOUS
  Filled 2017-03-04: qty 2000

## 2017-03-04 MED ORDER — SODIUM CHLORIDE 0.9 % IV BOLUS (SEPSIS)
1000.0000 mL | Freq: Once | INTRAVENOUS | Status: AC
  Administered 2017-03-04: 1000 mL via INTRAVENOUS

## 2017-03-04 MED ORDER — VANCOMYCIN HCL IN DEXTROSE 1-5 GM/200ML-% IV SOLN
1000.0000 mg | Freq: Two times a day (BID) | INTRAVENOUS | Status: AC
  Administered 2017-03-05 – 2017-03-06 (×4): 1000 mg via INTRAVENOUS
  Filled 2017-03-04 (×5): qty 200

## 2017-03-04 NOTE — Progress Notes (Signed)
ANTICOAGULATION CONSULT NOTE - Initial Consult  Pharmacy Consult for warfarin Indication: atrial fibrillation and lupus anticoagulant  Allergies  Allergen Reactions  . Heparin Hives    Patient Measurements: Height: 5\' 6"  (167.6 cm) Weight: 285 lb (129.3 kg) IBW/kg (Calculated) : 59.3  Vital Signs: Temp: 98.7 F (37.1 C) (12/08 1005) Temp Source: Oral (12/08 1005) BP: 141/112 (12/08 1300) Pulse Rate: 99 (12/08 1300)  Labs: Recent Labs    03/03/17 0933 03/04/17 1004  HGB  --  13.9  HCT  --  41.8  PLT  --  171  LABPROT  --  33.0*  INR 4.7 3.27  CREATININE  --  1.16*    Estimated Creatinine Clearance: 61.3 mL/min (A) (by C-G formula based on SCr of 1.16 mg/dL (H)).   Medical History: Past Medical History:  Diagnosis Date  . (HFpEF) heart failure with preserved ejection fraction ([place])    a. 06/2008 Echo: EF 55-60%, mild LVH, triv AI, mild to mod MS, mod to sev dil LA, mildly dil RA.  Marland Kitchen Acute right MCA stroke ([place])    a. 07/02/8293 Embolic stroke treated with TPA and thrombectomy with hemorrhagic transformation; Carotids 3/12 negative for ICA stenosis.  . Bilateral Pulmonary Emboli    a. 08/2005 - s/p IVC filter.  . Depression   . Embolus of femoral artery ([place])    a. 06/2008 s/p bilateral embolectomies.  . History of DVT (deep vein thrombosis)    a. s/p IVC filter.  Marland Kitchen HIT (heparin-induced thrombocytopenia) ([place])   . HTN (hypertension)   . Lupus anticoagulant disorder ([place])   . Obesity, unspecified   . Permanent atrial fibrillation ([place])    a. On chronic Coumadin - INRs followed by PCP; b. CHA2DS2VASc = 5.  . Popliteal artery embolism, right ([place])    a. 01/2017 in the setting of subRx INR-->s/p embolectomy.    Medications:  See electronic med list  Assessment: 71 y/o female admitted 03/04/2017 with cellulitis. She takes warfarin PTA for Afib, hx DVT/PE, + lupus anticoagulant. INR is therapeutic today at 3.27 after holding dose last night. No bleeding noted, CBC  wnl.  PTA regimen changed to 6 mg daily except 4.5 mg Mon at clinic visit on 12/7 for INR 4.7. She was to hold dose on 12/7.  Goal of Therapy:  INR 2.5-3.5 Monitor platelets by anticoagulation protocol: Yes   Plan:  - Warfarin 6 mg PO tonight - Daily INR - Monitor for s/sx of bleeding   Renold Genta, PharmD, BCPS Clinical Pharmacist Phone for today - [place] - [phone] 03/04/2017 2:16 PM

## 2017-03-04 NOTE — ED Provider Notes (Signed)
[facility] EMERGENCY DEPARTMENT Provider Note   CSN: [id number] Arrival date & time: 03/04/17  0865     History   Chief Complaint Chief Complaint  Patient presents with  . Post-op Problem  . Fever    HPI [name] is a 71 y.o. female who presents with a fever and right leg swelling. PMH significant for recent popliteal DVT requiring embolectomy by Dr. Oneida Alar on 11/22 due to ischemia, A fib on coumadin, antiphospholipid antibody syndrome. She was discharged from the hospital on 11/26 in good condition. Family is at bedside. They state the patient has been doing well until 2-3 days ago. She has developed redness and swelling over the incisional site. She has had difficulty walking due to the "soreness" but is able to walk. Family states she developed a fever yesterday and has had nausea without vomiting. She has been taking her Coumadin and had her INR checked yesterday which she was supratherapeutic. She was told to hold her Coumadin which she has done.    HPI  Past Medical History:  Diagnosis Date  . (HFpEF) heart failure with preserved ejection fraction ([place])    a. 06/2008 Echo: EF 55-60%, mild LVH, triv AI, mild to mod MS, mod to sev dil LA, mildly dil RA.  Marland Kitchen Acute right MCA stroke ([place])    a. 10/03/4694 Embolic stroke treated with TPA and thrombectomy with hemorrhagic transformation; Carotids 3/12 negative for ICA stenosis.  . Bilateral Pulmonary Emboli    a. 08/2005 - s/p IVC filter.  . Depression   . Embolus of femoral artery ([place])    a. 06/2008 s/p bilateral embolectomies.  . History of DVT (deep vein thrombosis)    a. s/p IVC filter.  Marland Kitchen HIT (heparin-induced thrombocytopenia) ([place])   . HTN (hypertension)   . Lupus anticoagulant disorder ()   . Obesity, unspecified   . Permanent atrial fibrillation ([place])    a. On chronic Coumadin - INRs followed by PCP; b. CHA2DS2VASc = 5.  . Popliteal artery embolism, right ([place])    a. 01/2017 in the setting of  subRx INR-->s/p embolectomy.    Patient Active Problem List   Diagnosis Date Noted  . Popliteal artery embolus ([place]) 02/16/2017  . Encounter for therapeutic drug monitoring 06/11/2013  . CVA (cerebral infarction) 11/18/2010  . Edema 08/18/2010  . Long term (current) use of anticoagulants 08/09/2010  . OBESITY, UNSPECIFIED 08/14/2008  . ATRIAL FIBRILLATION 03/07/2008  . PULMONARY EMBOLISM 08/30/2005  . ACUT VENUS EMBO&THROMB DEEP VES PROX LOWR EXTREM 08/30/2005    Past Surgical History:  Procedure Laterality Date  . distal radius fracture. (12,/16/2010)    . EMBOLECTOMY Right 02/15/2017   Procedure: EMBOLECTOMY RIGHT LEG;  Surgeon: Elam Dutch, MD;  Location: [facility] OR;  Service: Vascular;  Laterality: Right;  . IVC Placement 08/30/2005    . Open reduction and internal fixation of left intra-articular      OB History    No data available       Home Medications    Prior to Admission medications   Medication Sig Start Date End Date Taking? Authorizing Provider  acetaminophen (TYLENOL) 500 MG tablet Take 1,000 mg by mouth every 6 (six) hours as needed for mild pain.    [provider]  aspirin 81 MG tablet Take 81 mg by mouth daily.      [provider]  CARTIA XT 300 MG 24 hr capsule TAKE 1 CAPSULE BY MOUTH ONCE DAILY 01/25/17   Hochrein,  Jeneen Rinks, MD  cholecalciferol (VITAMIN D) 1000 units tablet Take 1,000 Units by mouth daily.    [provider]  furosemide (LASIX) 40 MG tablet Take 1 tablet (40 mg total) by mouth daily. TAKE ONE TABLET BY MOUTH ONCE DAILY. NEED OFFICE VISIT FOR ADDITIONAL REFILLS. 02/21/17   [name], PA-C  metoprolol tartrate (LOPRESSOR) 25 MG tablet Take 0.5 tablets (12.5 mg total) by mouth 2 (two) times daily. KEEP OV. 10/31/16   Minus Breeding, MD  potassium chloride (K-DUR) 10 MEQ tablet Take 1 tablet (10 mEq total) by mouth daily. 08/18/10 08/18/11  Richardson Dopp T, PA-C  potassium chloride (K-DUR) 10 MEQ tablet Take 1  tablet (10 mEq total) by mouth daily. 02/21/17 03/23/17  [name], PA-C  warfarin (COUMADIN) 3 MG tablet Take 2 tablets (6 mg total) by mouth daily. - needs INR and MD appt for further refills 02/21/17   [name], PA-C    Family History Family History  Problem Relation Age of Onset  . Coronary artery disease Unknown   . Diabetes Unknown     Social History Social History   Tobacco Use  . Smoking status: Never Smoker  . Smokeless tobacco: Never Used  Substance Use Topics  . Alcohol use: No  . Drug use: No     Allergies   Heparin   Review of Systems Review of Systems  Constitutional: Positive for fever. Negative for appetite change and chills.  HENT: Negative for congestion, rhinorrhea and sore throat.   Respiratory: Negative for cough and shortness of breath.   Cardiovascular: Negative for chest pain.  Gastrointestinal: Positive for nausea. Negative for abdominal pain, diarrhea and vomiting.  Genitourinary: Negative for dysuria and frequency.  Musculoskeletal: Positive for gait problem and myalgias.  Skin: Positive for color change.  Allergic/Immunologic: Negative for immunocompromised state.  All other systems reviewed and are negative.    Physical Exam Updated Vital Signs BP 129/60 (BP Location: Left Arm)   Pulse (!) 109   Temp 98.7 F (37.1 C) (Oral)   Resp (!) 26   Ht 5\' 6"  (1.676 m)   Wt 129.3 kg (285 lb)   SpO2 98%   BMI 46.00 kg/m   Physical Exam  Constitutional: She is oriented to person, place, and time. She appears well-developed and well-nourished. No distress.  Obese, calm, NAD  HENT:  Head: Normocephalic and atraumatic.  Eyes: Conjunctivae are normal. Pupils are equal, round, and reactive to light. Right eye exhibits no discharge. Left eye exhibits no discharge. No scleral icterus.  Neck: Normal range of motion.  Cardiovascular: Tachycardia present. Exam reveals no gallop and no friction rub.  No murmur heard. Pulmonary/Chest:  Effort normal and breath sounds normal. No stridor. No respiratory distress. She has no wheezes. She has no rales. She exhibits no tenderness.  Abdominal: Soft. Bowel sounds are normal. She exhibits no distension. There is no tenderness.  Neurological: She is alert and oriented to person, place, and time.  Skin: Skin is warm and dry. Rash (dry skin with excoriations over legs) noted.  RLE is erythematous from incisional site over medial calf with redness extending to foot. No drainage. +DP pulse.  Psychiatric: She has a normal mood and affect. Her behavior is normal.  Nursing note and vitals reviewed.      ED Treatments / Results  Labs (all labs ordered are listed, but only abnormal results are displayed) Labs Reviewed  COMPREHENSIVE METABOLIC PANEL - Abnormal; Notable for the following components:  Result Value   Sodium 132 (*)    Chloride 97 (*)    Glucose, Bld 205 (*)    Creatinine, Ser 1.16 (*)    Calcium 8.4 (*)    Albumin 2.9 (*)    Total Bilirubin 2.1 (*)    GFR calc non Af Amer 46 (*)    GFR calc Af Amer 54 (*)    All other components within normal limits  CBC WITH DIFFERENTIAL/PLATELET - Abnormal; Notable for the following components:   WBC 22.8 (*)    Neutro Abs 20.2 (*)    Monocytes Absolute 1.5 (*)    All other components within normal limits  PROTIME-INR - Abnormal; Notable for the following components:   Prothrombin Time 33.0 (*)    All other components within normal limits  I-STAT CG4 LACTIC ACID, ED - Abnormal; Notable for the following components:   Lactic Acid, Venous 2.87 (*)    All other components within normal limits  CULTURE, BLOOD (ROUTINE X 2)  CULTURE, BLOOD (ROUTINE X 2)  URINALYSIS, ROUTINE W REFLEX MICROSCOPIC  I-STAT CG4 LACTIC ACID, ED    EKG  EKG Interpretation  Date/Time:  Saturday March 04 2017 13:05:07 EST Ventricular Rate:  104 PR Interval:    QRS Duration: 89 QT Interval:  332 QTC Calculation: 437 R Axis:   97 Text  Interpretation:  Atrial fibrillation vs junctional rhythm Right axis deviation Minimal ST depression, lateral leads Otherwise no significant change Confirmed by Addison Lank [phone]) on 03/04/2017 1:11:28 PM       Radiology Dg Chest Port 1 View  Result Date: 03/04/2017 CLINICAL DATA:  Blood clot RIGHT leg removed after Thanksgiving, now with swelling and redness at site, sepsis, history of heart failure, pulmonary embolism, DVT, hypertension EXAM: PORTABLE CHEST 1 VIEW COMPARISON:  Portable exam 1241 hours compared to 06/06/2010 FINDINGS: Enlargement of cardiac silhouette. Mediastinal contours and pulmonary vascularity normal. Lungs clear. No pleural effusion or pneumothorax. Bones demineralized. IMPRESSION: Enlargement of cardiac silhouette. No acute abnormalities. Electronically Signed   By: Lavonia Dana M.D.   On: 03/04/2017 13:15    Procedures Procedures (including critical care time)  Medications Ordered in ED Medications  vancomycin (VANCOCIN) 2,000 mg in sodium chloride 0.9 % 500 mL IVPB (2,000 mg Intravenous New Bag/Given 03/04/17 1311)    Followed by  vancomycin (VANCOCIN) IVPB 1000 mg/200 mL premix (not administered)  sodium chloride 0.9 % bolus 1,000 mL (1,000 mLs Intravenous New Bag/Given 03/04/17 1311)     Initial Impression / Assessment and Plan / ED Course  I have reviewed the triage vital signs and the nursing notes.  Pertinent labs & imaging results that were available during my care of the patient were reviewed by me and considered in my medical decision making (see chart for details).  71 year old female with pain, redness, swelling of RLE s/p embolectomy. She is tachycardic in the ED but otherwise vitals are normal. RLE is warm and erythematous. CBC is remarkable for leukocytosis of 22.8. CMP is remarkable for mild hyponatremia (132), hypochloremia (97), hyperglycemia (207), elevated SCr (1.16), elevated bilirubin (2.9). Initial lactic acid is 1.85. Second has increased  to 2.87. Code sepsis was called. Blood cultures were obtained. CXR is normal. UA is pending. Vancomycin was started empirically for presumed cellulitis. 1L of IVF was given. She is not hypotensive. LE US shows no evidence of DVT but does show "mixed echoes" in the proximal - mid calf over surgical site. Shared visit with Dr. Leonette Monarch. Will admit  patient for further management. Spoke with Lurlean Leyden, NP who will come to see patient.  Final Clinical Impressions(s) / ED Diagnoses   Final diagnoses:  Sepsis, due to unspecified organism [facility])  Cellulitis of right lower extremity    ED Discharge Orders    None       Recardo Evangelist, PA-C 03/04/17 1508    Fatima Blank, MD 03/05/17 1730

## 2017-03-04 NOTE — Progress Notes (Signed)
VASCULAR LAB PRELIMINARY  PRELIMINARY  PRELIMINARY  PRELIMINARY  Right lower extremity venous duplex completed.    Preliminary report:  There is no obvious evidence of DVT or SVT noted in the visualized veins of the right lower extremity.  There is an area of mixed echoes noted in the proximal to mid calf, near the surgical site, etiology unknown.   Gave report to [place], RN  [name], [place], RVT 03/04/2017, 11:21 AM

## 2017-03-04 NOTE — Progress Notes (Addendum)
Pharmacy Antibiotic Note  [name] is a 71 y.o. female admitted on 03/04/2017 with cellulitis. Recently had right popliteal artery embolectomy. Pharmacy has been consulted for vancomyin dosing. SCr 1.16, norm CrCl ~50 ml/min. WBC elevated.  Plan: Vancomycin 2000 mg IV once then 1000 mg IV q12h, goal trough 10-15 mcg/ml Monitor renal function, clinical progress, cultures Trough as clinically indicated  Height: 5\' 6"  (167.6 cm) Weight: 285 lb (129.3 kg) IBW/kg (Calculated) : 59.3  Temp (24hrs), Avg:98.7 F (37.1 C), Min:98.7 F (37.1 C), Max:98.7 F (37.1 C)  Recent Labs  Lab 03/04/17 1004 03/04/17 1018  WBC 22.8*  --   CREATININE 1.16*  --   LATICACIDVEN  --  1.85    Estimated Creatinine Clearance: 61.3 mL/min (A) (by C-G formula based on SCr of 1.16 mg/dL (H)).    Allergies  Allergen Reactions  . Heparin Hives    Antimicrobials this admission: vanc 12/8 >>   Dose adjustments this admission:   Microbiology results: 12/8 BCx:    Thank you for allowing pharmacy to be a part of this patient's care.  Renold Genta, PharmD, BCPS Clinical Pharmacist Phone for today - [place] - [phone] 03/04/2017 12:29 PM    Addendum: Also adding Zosyn. First dose ordered and pending in the ED.  Zosyn 3.375 g IV q8h to be infused over 4 hours    Renold Genta, PharmD, [employer] Clinical Pharmacist 03/04/2017 2:11 PM

## 2017-03-04 NOTE — ED Notes (Signed)
Report attempted x 1

## 2017-03-04 NOTE — ED Notes (Signed)
I Stat Lactic Acid results shown to [employer] PA

## 2017-03-04 NOTE — H&P (Signed)
History and Physical    [name] OZD:[id number] DOB: 08-18-1945 DOA: 03/04/2017  PCP: Josetta Huddle, MD Patient coming from: home  Chief Complaint: fever/painin right LE  HPI: [name] is a 71 y.o. female with medical history significant A. fib on Coumadin and prior embolic events, stroke, hypertension, recent popliteal artery embolism causing ischemia right leg in the setting of subtherapeutic INR and lupus anticoagulant, status post embolectomy 2 weeks ago presents to the emergency department with the chief complaint of 2 day history of right leg swelling. Initial evaluation reveals tachycardia leukocytosis elevated lactic acid. [facility] are asked to admit  Information is obtained from the patient and her daughter who is at the bedside. She states that she had a DVT requiring embolectomy on November 22. She was doing well until 2 days ago when her incision became red slightly firm. He denies pain at the incision site but states it is sore with weightbearing. This morning she had a temperature of 101.5. Associated symptoms include persistent nausea without vomiting decreased oral intake. She denies headache dizziness syncope or near-syncope. He denies chest pain palpitations shortness of breath diarrhea constipation melena bright red blood per rectum. She denies dysuria hematuria frequency or urgency. She reports compliance with her medications. She reports she had her INR checked yesterday and was told to hold her Coumadin  ED Course: In the emergency department max temperature 90.9 she is hypertensive EKG reveals A. fib with a junctional rhythm of a rate of 104. She is provided with IV fluids and IV antibiotics per sepsis protocol  Review of Systems: As per HPI otherwise all other systems reviewed and are negative.   Ambulatory Status: Ambulates independently is independent with ADLs no recent falls  Past Medical History:  Diagnosis Date  . (HFpEF) heart failure with  preserved ejection fraction ([place])    a. 06/2008 Echo: EF 55-60%, mild LVH, triv AI, mild to mod MS, mod to sev dil LA, mildly dil RA.  Marland Kitchen Acute right MCA stroke ([place])    a. 05/02/9561 Embolic stroke treated with TPA and thrombectomy with hemorrhagic transformation; Carotids 3/12 negative for ICA stenosis.  . Bilateral Pulmonary Emboli    a. 08/2005 - s/p IVC filter.  . Depression   . Embolus of femoral artery ([place])    a. 06/2008 s/p bilateral embolectomies.  . History of DVT (deep vein thrombosis)    a. s/p IVC filter.  Marland Kitchen HIT (heparin-induced thrombocytopenia) ([place])   . HTN (hypertension)   . Lupus anticoagulant disorder ([place])   . Obesity, unspecified   . Permanent atrial fibrillation ([place])    a. On chronic Coumadin - INRs followed by PCP; b. CHA2DS2VASc = 5.  . Popliteal artery embolism, right ([place])    a. 01/2017 in the setting of subRx INR-->s/p embolectomy.    Past Surgical History:  Procedure Laterality Date  . distal radius fracture. (12,/16/2010)    . EMBOLECTOMY Right 02/15/2017   Procedure: EMBOLECTOMY RIGHT LEG;  Surgeon: Elam Dutch, MD;  Location: [facility] OR;  Service: Vascular;  Laterality: Right;  . IVC Placement 08/30/2005    . Open reduction and internal fixation of left intra-articular      Social History   Socioeconomic History  . Marital status: Single    Spouse name: Not on file  . Number of children: Not on file  . Years of education: Not on file  . Highest education level: Not on file  Social Needs  . Financial resource strain: Not  on file  . Food insecurity - worry: Not on file  . Food insecurity - inability: Not on file  . Transportation needs - medical: Not on file  . Transportation needs - non-medical: Not on file  Occupational History  . Not on file  Tobacco Use  . Smoking status: Never Smoker  . Smokeless tobacco: Never Used  Substance and Sexual Activity  . Alcohol use: No  . Drug use: No  . Sexual activity: Not Currently  Other Topics  Concern  . Not on file  Social History Narrative    Lives alone.  She works at a [employer].   Daughter in area to assist if needed.  One-level home, 5-6 steps to   entry.  Functional history prior to admission was independent,   functional status upon admission to rehab services was +2, total assist   bed mobility,+2 total assist transfers, ambulation not tested, moderate   assist upper body, total assist lower body, activities of daily living.       Allergies  Allergen Reactions  . Heparin Hives  . Other Itching and Rash    "Sheets and Linens used by [facility]ac[facility] Pontes"    Family History  Problem Relation Age of Onset  . Coronary artery disease Unknown   . Diabetes Unknown     Prior to Admission medications   Medication Sig Start Date End Date Taking? Authorizing Provider  acetaminophen (TYLENOL) 500 MG tablet Take 1,000 mg by mouth every 6 (six) hours as needed for mild pain.   Yes [provider]  aspirin 81 MG tablet Take 81 mg by mouth daily.     Yes [provider]  CARTIA XT 300 MG 24 hr capsule TAKE 1 CAPSULE BY MOUTH ONCE DAILY 01/25/17  Yes Minus Breeding, MD  cholecalciferol (VITAMIN D) 1000 units tablet Take 1,000 Units by mouth daily.   Yes [provider]  furosemide (LASIX) 40 MG tablet Take 1 tablet (40 mg total) by mouth daily. TAKE ONE TABLET BY MOUTH ONCE DAILY. NEED OFFICE VISIT FOR ADDITIONAL REFILLS. 02/21/17  Yes [name], PA-C  metoprolol tartrate (LOPRESSOR) 25 MG tablet Take 0.5 tablets (12.5 mg total) by mouth 2 (two) times daily. KEEP OV. 10/31/16  Yes Minus Breeding, MD  potassium chloride (K-DUR) 10 MEQ tablet Take 1 tablet (10 mEq total) by mouth daily. 08/18/10 03/04/17 Yes Weaver, Scott T, PA-C  warfarin (COUMADIN) 3 MG tablet Take 6 mg by mouth 2 (two) times daily. - needs INR and MD appt for further refills 02/21/17  Yes [name], PA-C    Physical Exam: Vitals:   03/04/17 1003 03/04/17 1005  03/04/17 1209 03/04/17 1300  BP:  125/73 129/60 (!) 141/112  Pulse:  (!) 110 (!) 109 99  Resp:  16 (!) 26 (!) 26  Temp:  98.7 F (37.1 C)    TempSrc:  Oral    SpO2:  100% 98% 97%  Weight: 129.3 kg (285 lb)     Height: 5\' 6"  (1.676 m)        General:  Appears calm and comfortable in no acute distress Eyes:  PERRL, EOMI, normal lids, iris ENT:  grossly normal hearing, lips & tongue, mucous membranes of her mouth are moist and pink Neck:  no LAD, masses or thyromegaly Cardiovascular:  Irregularly irregular tachycardia, no m/r/g. 1+ LE edema right lower extremity with staples inner aspect from mid calf to above the knee. Area is swollen red slightly warm to touch. Area is also  firm. No drainage no tenderness Respiratory:  CTA bilaterally, no w/r/r. Normal respiratory effort. Abdomen:  soft, ntnd, obese soft Skin:  no rash or induration seen on limited exam Musculoskeletal:  grossly normal tone BUE/BLE, good ROM, no bony abnormality Psychiatric:  grossly normal mood and affect, speech fluent and appropriate, AOx3 Neurologic:  CN 2-12 grossly intact, moves all extremities in coordinated fashion, sensation intact  Labs on Admission: I have personally reviewed following labs and imaging studies  CBC: Recent Labs  Lab 03/04/17 1004  WBC 22.8*  NEUTROABS 20.2*  HGB 13.9  HCT 41.8  MCV 99.5  PLT 426   Basic Metabolic Panel: Recent Labs  Lab 03/04/17 1004  NA 132*  K 3.9  CL 97*  CO2 26  GLUCOSE 205*  BUN 12  CREATININE 1.16*  CALCIUM 8.4*   GFR: Estimated Creatinine Clearance: 61.3 mL/min (A) (by C-G formula based on SCr of 1.16 mg/dL (H)). Liver Function Tests: Recent Labs  Lab 03/04/17 1004  AST 18  ALT 14  ALKPHOS 95  BILITOT 2.1*  PROT 7.3  ALBUMIN 2.9*   No results for input(s): LIPASE, AMYLASE in the last 168 hours. No results for input(s): AMMONIA in the last 168 hours. Coagulation Profile: Recent Labs  Lab 03/03/17 0933 03/04/17 1004  INR 4.7 3.27     Cardiac Enzymes: No results for input(s): CKTOTAL, CKMB, CKMBINDEX, TROPONINI in the last 168 hours. BNP (last 3 results) No results for input(s): PROBNP in the last 8760 hours. HbA1C: No results for input(s): HGBA1C in the last 72 hours. CBG: No results for input(s): GLUCAP in the last 168 hours. Lipid Profile: No results for input(s): CHOL, HDL, LDLCALC, TRIG, CHOLHDL, LDLDIRECT in the last 72 hours. Thyroid Function Tests: No results for input(s): TSH, T4TOTAL, FREET4, T3FREE, THYROIDAB in the last 72 hours. Anemia Panel: No results for input(s): VITAMINB12, FOLATE, FERRITIN, TIBC, IRON, RETICCTPCT in the last 72 hours. Urine analysis:    Component Value Date/Time   COLORURINE YELLOW 07/07/2010 1500   APPEARANCEUR CLOUDY (A) 07/07/2010 1500   LABSPEC 1.018 07/07/2010 1500   PHURINE 6.5 07/07/2010 1500   GLUCOSEU NEGATIVE 07/07/2010 1500   HGBUR MODERATE (A) 07/07/2010 1500   BILIRUBINUR NEGATIVE 07/07/2010 1500   KETONESUR NEGATIVE 07/07/2010 1500   PROTEINUR NEGATIVE 07/07/2010 1500   UROBILINOGEN 1.0 07/07/2010 1500   NITRITE NEGATIVE 07/07/2010 1500   LEUKOCYTESUR MODERATE (A) 07/07/2010 1500    Creatinine Clearance: Estimated Creatinine Clearance: 61.3 mL/min (A) (by C-G formula based on SCr of 1.16 mg/dL (H)).  Sepsis Labs: @LABRCNTIP (procalcitonin:4,lacticidven:4) )No results found for this or any previous visit (from the past 240 hour(s)).   Radiological Exams on Admission: Dg Chest Port 1 View  Result Date: 03/04/2017 CLINICAL DATA:  Blood clot RIGHT leg removed after Thanksgiving, now with swelling and redness at site, sepsis, history of heart failure, pulmonary embolism, DVT, hypertension EXAM: PORTABLE CHEST 1 VIEW COMPARISON:  Portable exam 1241 hours compared to 06/06/2010 FINDINGS: Enlargement of cardiac silhouette. Mediastinal contours and pulmonary vascularity normal. Lungs clear. No pleural effusion or pneumothorax. Bones demineralized. IMPRESSION:  Enlargement of cardiac silhouette. No acute abnormalities. Electronically Signed   By: Lavonia Dana M.D.   On: 03/04/2017 13:15    EKG: Independently reviewed. Atrial fibrillation vs junctional rhythm Right axis deviation Minimal ST depression, lateral leads Otherwise no significant change  Assessment/Plan Principal Problem:   Sepsis ([place]) Active Problems:   Obesity, unspecified   ATRIAL FIBRILLATION   Popliteal artery embolus ([facility])   Cellulitis   #  1. Sepsis. Likely related to infectious process at embolectomy site on right LE. WBCs 22, lactic acid 2.87, tachycardic. He is provided with IV fluids in the emergency department. She is non-toxic appearing -admit to tele -follow blood cultures -trend lactic acid -continue antibiotics per protocol  #2. Cellulitis. Site of embolectomy right LE 2 weeks ago. Some concern for hematoma vs cellulitis on exam. Reported fever 101.4, leukocytosis, elevated lactic acid. See #1 -antibiotics per protocol -monitor closely  -if not improvement consider surgical consult  #3. A. Fib. She is on Coumadin as well as Cardizem and metoprolol. She has not had any medications today. EKG as noted above. INR 3.2 today. -Resume home meds -Coumadin per pharmacy  #4. Popliteal artery embolus status post embolectomy. Chart review indicates recent with a history of embolic events. She's had one prior embolic stroke 6270, she's had bilateral femoral embolectomies in 2012. She also has had a prior DVT and has an IVC filter. She also has lupus anticoagulant. Reportedly her INR had been subtherapeutic for several day she developed a DVT and underwent embolectomy November 26 and discharged next day -see above -coumadin per pharmacy    DVT prophylaxis: coumadin  Code Status: full  Family Communication: daughter at bedside  Disposition Plan: home  Consults called: none   Admission status: obs    Radene Gunning MD [facility]  If 7PM-7AM, please contact  night-coverage [email or web address] Password [email or web address]  03/04/2017, 2:42 PM

## 2017-03-04 NOTE — ED Triage Notes (Signed)
Pt here for evaluation of right leg. Pt had surgery for blood clot removal to right leg the day after Thanksgiving. Pt surgical site looked good until 2 days ago the site now has swelling with redness, the surrounding tissue is taut, red and swollen. Pt also states no bowel movement since Wednesday, temps of 101 at home.

## 2017-03-05 ENCOUNTER — Observation Stay (HOSPITAL_COMMUNITY): Payer: Medicare Other | Admitting: Certified Registered"

## 2017-03-05 ENCOUNTER — Encounter (HOSPITAL_COMMUNITY)
Admission: EM | Disposition: A | Discharge status: Home / Self Care | Payer: Self-pay | Source: Home / Self Care | Attending: Family Medicine

## 2017-03-05 DIAGNOSIS — I482 Chronic atrial fibrillation: Secondary | ICD-10-CM | POA: Diagnosis not present

## 2017-03-05 DIAGNOSIS — I5032 Chronic diastolic (congestive) heart failure: Secondary | ICD-10-CM | POA: Diagnosis not present

## 2017-03-05 DIAGNOSIS — D6862 Lupus anticoagulant syndrome: Secondary | ICD-10-CM | POA: Diagnosis not present

## 2017-03-05 DIAGNOSIS — Z86711 Personal history of pulmonary embolism: Secondary | ICD-10-CM | POA: Diagnosis not present

## 2017-03-05 DIAGNOSIS — A419 Sepsis, unspecified organism: Secondary | ICD-10-CM | POA: Diagnosis not present

## 2017-03-05 DIAGNOSIS — Z888 Allergy status to other drugs, medicaments and biological substances status: Secondary | ICD-10-CM | POA: Diagnosis not present

## 2017-03-05 DIAGNOSIS — Z7901 Long term (current) use of anticoagulants: Secondary | ICD-10-CM | POA: Diagnosis not present

## 2017-03-05 DIAGNOSIS — E878 Other disorders of electrolyte and fluid balance, not elsewhere classified: Secondary | ICD-10-CM | POA: Diagnosis present

## 2017-03-05 DIAGNOSIS — I110 Hypertensive heart disease with heart failure: Secondary | ICD-10-CM | POA: Diagnosis not present

## 2017-03-05 DIAGNOSIS — L03115 Cellulitis of right lower limb: Secondary | ICD-10-CM | POA: Diagnosis not present

## 2017-03-05 DIAGNOSIS — N179 Acute kidney failure, unspecified: Secondary | ICD-10-CM | POA: Diagnosis not present

## 2017-03-05 DIAGNOSIS — A4102 Sepsis due to Methicillin resistant Staphylococcus aureus: Secondary | ICD-10-CM | POA: Diagnosis not present

## 2017-03-05 DIAGNOSIS — B9562 Methicillin resistant Staphylococcus aureus infection as the cause of diseases classified elsewhere: Secondary | ICD-10-CM | POA: Diagnosis present

## 2017-03-05 DIAGNOSIS — Z8673 Personal history of transient ischemic attack (TIA), and cerebral infarction without residual deficits: Secondary | ICD-10-CM | POA: Diagnosis not present

## 2017-03-05 DIAGNOSIS — Z6841 Body Mass Index (BMI) 40.0 and over, adult: Secondary | ICD-10-CM | POA: Diagnosis not present

## 2017-03-05 DIAGNOSIS — I743 Embolism and thrombosis of arteries of the lower extremities: Secondary | ICD-10-CM | POA: Diagnosis not present

## 2017-03-05 DIAGNOSIS — I9789 Other postprocedural complications and disorders of the circulatory system, not elsewhere classified: Secondary | ICD-10-CM | POA: Diagnosis not present

## 2017-03-05 DIAGNOSIS — F329 Major depressive disorder, single episode, unspecified: Secondary | ICD-10-CM | POA: Diagnosis present

## 2017-03-05 DIAGNOSIS — I4891 Unspecified atrial fibrillation: Secondary | ICD-10-CM | POA: Diagnosis not present

## 2017-03-05 DIAGNOSIS — R6520 Severe sepsis without septic shock: Secondary | ICD-10-CM | POA: Diagnosis not present

## 2017-03-05 DIAGNOSIS — L02415 Cutaneous abscess of right lower limb: Secondary | ICD-10-CM | POA: Diagnosis not present

## 2017-03-05 DIAGNOSIS — Z86718 Personal history of other venous thrombosis and embolism: Secondary | ICD-10-CM | POA: Diagnosis not present

## 2017-03-05 DIAGNOSIS — L089 Local infection of the skin and subcutaneous tissue, unspecified: Secondary | ICD-10-CM | POA: Diagnosis not present

## 2017-03-05 DIAGNOSIS — E669 Obesity, unspecified: Secondary | ICD-10-CM | POA: Diagnosis present

## 2017-03-05 DIAGNOSIS — T8141XA Infection following a procedure, superficial incisional surgical site, initial encounter: Secondary | ICD-10-CM | POA: Diagnosis not present

## 2017-03-05 DIAGNOSIS — R739 Hyperglycemia, unspecified: Secondary | ICD-10-CM | POA: Diagnosis present

## 2017-03-05 DIAGNOSIS — I10 Essential (primary) hypertension: Secondary | ICD-10-CM | POA: Diagnosis not present

## 2017-03-05 DIAGNOSIS — Y848 Other medical procedures as the cause of abnormal reaction of the patient, or of later complication, without mention of misadventure at the time of the procedure: Secondary | ICD-10-CM | POA: Diagnosis present

## 2017-03-05 DIAGNOSIS — E871 Hypo-osmolality and hyponatremia: Secondary | ICD-10-CM | POA: Diagnosis not present

## 2017-03-05 HISTORY — PX: I & D EXTREMITY: SHX5045

## 2017-03-05 LAB — BASIC METABOLIC PANEL
Anion gap: 8 (ref 5–15)
BUN: 11 mg/dL (ref 6–20)
CO2: 27 mmol/L (ref 22–32)
CREATININE: 1.1 mg/dL — AB (ref 0.44–1.00)
Calcium: 8 mg/dL — ABNORMAL LOW (ref 8.9–10.3)
Chloride: 101 mmol/L (ref 101–111)
GFR, EST AFRICAN AMERICAN: 57 mL/min — AB (ref >60)
GFR, EST NON AFRICAN AMERICAN: 49 mL/min — AB (ref >60)
Glucose, Bld: 127 mg/dL — ABNORMAL HIGH (ref 65–99)
POTASSIUM: 3.9 mmol/L (ref 3.5–5.1)
SODIUM: 136 mmol/L (ref 135–145)

## 2017-03-05 LAB — PROTIME-INR
INR: 2.68
INR: 1.57
Prothrombin Time: 28.3 seconds — ABNORMAL HIGH (ref 11.4–15.2)
Prothrombin Time: 18.7 seconds — ABNORMAL HIGH (ref 11.4–15.2)

## 2017-03-05 LAB — URINALYSIS, ROUTINE W REFLEX MICROSCOPIC
Bilirubin Urine: NEGATIVE
GLUCOSE, UA: NEGATIVE mg/dL
Ketones, ur: NEGATIVE mg/dL
Leukocytes, UA: NEGATIVE
NITRITE: POSITIVE — AB
PH: 6 (ref 5.0–8.0)
PROTEIN: 100 mg/dL — AB
SPECIFIC GRAVITY, URINE: 1.027 (ref 1.005–1.030)

## 2017-03-05 LAB — CBC
HEMATOCRIT: 37.4 % (ref 36.0–46.0)
HEMOGLOBIN: 12.1 g/dL (ref 12.0–15.0)
MCH: 32.8 pg (ref 26.0–34.0)
MCHC: 32.4 g/dL (ref 30.0–36.0)
MCV: 101.4 fL — AB (ref 78.0–100.0)
Platelets: 164 10*3/uL (ref 150–400)
RBC: 3.69 MIL/uL — AB (ref 3.87–5.11)
RDW: 14 % (ref 11.5–15.5)
WBC: 18.3 10*3/uL — AB (ref 4.0–10.5)

## 2017-03-05 LAB — APTT: APTT: 81 s — AB (ref 24–36)

## 2017-03-05 LAB — TYPE AND SCREEN
ABO/RH(D): O POS
ANTIBODY SCREEN: NEGATIVE

## 2017-03-05 LAB — ABO/RH: ABO/RH(D): O POS

## 2017-03-05 SURGERY — IRRIGATION AND DEBRIDEMENT EXTREMITY
Anesthesia: General | Site: Leg Lower | Laterality: Right

## 2017-03-05 MED ORDER — MIDAZOLAM HCL 2 MG/2ML IJ SOLN
INTRAMUSCULAR | Status: AC
  Filled 2017-03-05: qty 2

## 2017-03-05 MED ORDER — PROPOFOL 10 MG/ML IV BOLUS
INTRAVENOUS | Status: DC | PRN
  Administered 2017-03-05: 130 mg via INTRAVENOUS

## 2017-03-05 MED ORDER — ARGATROBAN 50 MG/50ML IV SOLN
0.3500 ug/kg/min | INTRAVENOUS | Status: DC
  Administered 2017-03-05: 1 ug/kg/min via INTRAVENOUS
  Administered 2017-03-06: 0.3 ug/kg/min via INTRAVENOUS
  Administered 2017-03-06: 1 ug/kg/min via INTRAVENOUS
  Administered 2017-03-07: 0.3 ug/kg/min via INTRAVENOUS
  Administered 2017-03-08 – 2017-03-11 (×5): 0.35 ug/kg/min via INTRAVENOUS
  Filled 2017-03-05 (×9): qty 50

## 2017-03-05 MED ORDER — SODIUM CHLORIDE 0.9 % IV SOLN
Freq: Once | INTRAVENOUS | Status: AC
  Administered 2017-03-05: 09:00:00 via INTRAVENOUS

## 2017-03-05 MED ORDER — SODIUM CHLORIDE 0.9 % IV SOLN
INTRAVENOUS | Status: DC
  Administered 2017-03-05 – 2017-03-06 (×3): via INTRAVENOUS

## 2017-03-05 MED ORDER — MIDAZOLAM HCL 5 MG/5ML IJ SOLN
INTRAMUSCULAR | Status: DC | PRN
  Administered 2017-03-05: 1 mg via INTRAVENOUS

## 2017-03-05 MED ORDER — PHENYLEPHRINE HCL 10 MG/ML IJ SOLN
INTRAVENOUS | Status: DC | PRN
  Administered 2017-03-05: 20 ug/min via INTRAVENOUS

## 2017-03-05 MED ORDER — FENTANYL CITRATE (PF) 100 MCG/2ML IJ SOLN
INTRAMUSCULAR | Status: DC | PRN
  Administered 2017-03-05: 100 ug via INTRAVENOUS

## 2017-03-05 MED ORDER — OXYCODONE HCL 5 MG/5ML PO SOLN: 5.0000 mg | Freq: Once | ORAL | Status: DC | PRN

## 2017-03-05 MED ORDER — SUGAMMADEX SODIUM 200 MG/2ML IV SOLN
INTRAVENOUS | Status: DC | PRN
  Administered 2017-03-05: 300 mg via INTRAVENOUS

## 2017-03-05 MED ORDER — LACTATED RINGERS IV SOLN
INTRAVENOUS | Status: DC | PRN
  Administered 2017-03-05: 12:00:00 via INTRAVENOUS

## 2017-03-05 MED ORDER — FENTANYL CITRATE (PF) 100 MCG/2ML IJ SOLN
25.0000 ug | INTRAMUSCULAR | Status: DC | PRN
Start: 2017-03-05 — End: 2017-03-05

## 2017-03-05 MED ORDER — SUCCINYLCHOLINE CHLORIDE 20 MG/ML IJ SOLN
INTRAMUSCULAR | Status: DC | PRN
  Administered 2017-03-05: 80 mg via INTRAVENOUS

## 2017-03-05 MED ORDER — FENTANYL CITRATE (PF) 250 MCG/5ML IJ SOLN
INTRAMUSCULAR | Status: AC
Start: 2017-03-05 — End: ?
  Filled 2017-03-05: qty 5

## 2017-03-05 MED ORDER — OXYCODONE HCL 5 MG PO TABS: 5.0000 mg | ORAL_TABLET | Freq: Once | ORAL | Status: DC | PRN

## 2017-03-05 MED ORDER — VITAMIN K1 10 MG/ML IJ SOLN
10.0000 mg | INTRAMUSCULAR | Status: AC
  Administered 2017-03-05: 10 mg via INTRAVENOUS
  Filled 2017-03-05: qty 1

## 2017-03-05 MED ORDER — 0.9 % SODIUM CHLORIDE (POUR BTL) OPTIME
TOPICAL | Status: DC | PRN
Start: 2017-03-05 — End: 2017-03-05
  Administered 2017-03-05: 1000 mL

## 2017-03-05 MED ORDER — ROCURONIUM BROMIDE 100 MG/10ML IV SOLN
INTRAVENOUS | Status: DC | PRN
  Administered 2017-03-05: 30 mg via INTRAVENOUS

## 2017-03-05 MED ORDER — LIDOCAINE HCL (CARDIAC) 20 MG/ML IV SOLN
INTRAVENOUS | Status: DC | PRN
  Administered 2017-03-05: 40 mg via INTRAVENOUS

## 2017-03-05 SURGICAL SUPPLY — 28 items
BANDAGE ACE 4X5 VEL STRL LF (GAUZE/BANDAGES/DRESSINGS) IMPLANT
BANDAGE ACE 6X5 VEL STRL LF (GAUZE/BANDAGES/DRESSINGS) ×1 IMPLANT
BNDG GAUZE ELAST 4 BULKY (GAUZE/BANDAGES/DRESSINGS) ×2 IMPLANT
CANISTER SUCT 3000ML PPV (MISCELLANEOUS) ×2 IMPLANT
COVER SURGICAL LIGHT HANDLE (MISCELLANEOUS) ×2 IMPLANT
ELECT REM PT RETURN 9FT ADLT (ELECTROSURGICAL) ×2
ELECTRODE REM PT RTRN 9FT ADLT (ELECTROSURGICAL) ×1 IMPLANT
GAUZE SPONGE 4X4 12PLY STRL (GAUZE/BANDAGES/DRESSINGS) ×2 IMPLANT
GAUZE SPONGE 4X4 12PLY STRL LF (GAUZE/BANDAGES/DRESSINGS) ×1 IMPLANT
GLOVE BIO SURGEON STRL SZ7.5 (GLOVE) ×2 IMPLANT
GOWN STRL REUS W/ TWL LRG LVL3 (GOWN DISPOSABLE) ×3 IMPLANT
GOWN STRL REUS W/TWL LRG LVL3 (GOWN DISPOSABLE) ×6
KIT BASIN OR (CUSTOM PROCEDURE TRAY) ×2 IMPLANT
KIT ROOM TURNOVER OR (KITS) ×2 IMPLANT
NS IRRIG 1000ML POUR BTL (IV SOLUTION) ×2 IMPLANT
PACK GENERAL/GYN (CUSTOM PROCEDURE TRAY) ×1 IMPLANT
PACK UNIVERSAL I (CUSTOM PROCEDURE TRAY) ×1 IMPLANT
PAD ABD 8X10 STRL (GAUZE/BANDAGES/DRESSINGS) ×1 IMPLANT
PAD ARMBOARD 7.5X6 YLW CONV (MISCELLANEOUS) ×4 IMPLANT
SUT ETHILON 3 0 PS 1 (SUTURE) IMPLANT
SUT VIC AB 2-0 CTX 36 (SUTURE) IMPLANT
SUT VIC AB 3-0 SH 27 (SUTURE)
SUT VIC AB 3-0 SH 27X BRD (SUTURE) IMPLANT
SUT VICRYL 4-0 PS2 18IN ABS (SUTURE) IMPLANT
SWAB COLLECTION DEVICE MRSA (MISCELLANEOUS) ×1 IMPLANT
SWAB CULTURE ESWAB REG 1ML (MISCELLANEOUS) ×1 IMPLANT
TOWEL GREEN STERILE (TOWEL DISPOSABLE) ×2 IMPLANT
WATER STERILE IRR 1000ML POUR (IV SOLUTION) ×2 IMPLANT

## 2017-03-05 NOTE — Progress Notes (Signed)
PROGRESS NOTE    [name]  VOJ:[id number] DOB: June 06, 1945 DOA: 03/04/2017 PCP: Josetta Huddle, MD    Brief Narrative:  [name] is a 71 y.o. female with medical history significant A. fib on Coumadin and prior embolic events, stroke, hypertension, recent popliteal artery embolism causing ischemia right leg in the setting of subtherapeutic INR and lupus anticoagulant, status post embolectomy 2 weeks ago presents to the emergency department with the chief complaint of 2 day history of right leg swelling. Initial evaluation reveals tachycardia leukocytosis elevated lactic acid. [facility] are asked to admit    Assessment & Plan:   Principal Problem:   Sepsis ([place]) Active Problems:   Obesity, unspecified   ATRIAL FIBRILLATION   Popliteal artery embolus (H[facility])   Cellulitis   1-Sepsis; Presents with fever, leukocytosis, lactic acidosis. Tachycardia.  Likely related to infectious process at embolectomy site on right LE Lactic acid peak to 2.8, it has decreased to 1.4. WBC trending down from 22 to 18.  Continue with IV antibiotics. Vancvom  2-Right lower extremity Cellulitis; redness at incision site,  Continue with IV vancomycin and Zosyn.  Doppler; Area of mix echoes in the proximal mid calf, near the surgical site.  Dr Eden Lathe consulted.   Hyponatremia, mild; resolved with IV fluids.   Hyperbilirubinemia; repeat labs in am.   AKI; in setting of sepsis. Last cr per records at 0.8.  Continue with IV fluids.  UA with positives nitrates. Check urine culture. Already on IV antibiotics.  Hold lasix.   A fib; continue with coumadin, metoprolol and Cardizem.   Chronic Diastolic HF;  Hold lasix.  Compensated.   Ploplitial artery embolus S/P embolectomy.  Performed by Dr Eden Lathe.  INR at 2.6. Continue with Coumadin.   DVT prophylaxis:coumadin.  Code Status: full code Family Communication: (Specify name, relationship & date discussed. NO "discussed with  patient") Disposition Plan: Remain inpatient.   Consultants:   Vascular.    Procedures:  none  Antimicrobials:  Vancomycin 12-09 Zosyn 12-09  Subjective: She is alert, presented with fever and redness in the surgical site.  Denies cough, dysuria, diarrhea.   Objective: Vitals:   03/04/17 1631 03/04/17 1948 03/04/17 2353 03/05/17 0429  BP: (!) 142/125 111/89 116/63 (!) 132/103  Pulse: (!) 106 95 98 86  Resp: (!) 24 (!) 28 (!) 27 (!) 22  Temp: 99 F (37.2 C) 99.1 F (37.3 C)  99.5 F (37.5 C)  TempSrc: Oral Oral  Oral  SpO2:  96% 97%   Weight:    128.1 kg (282 lb 6.6 oz)  Height:        Intake/Output Summary (Last 24 hours) at 03/05/2017 0734 Last data filed at 03/05/2017 0500 Gross per 24 hour  Intake 768.75 ml  Output 70 ml  Net 698.75 ml   Filed Weights   03/04/17 1003 03/05/17 0429  Weight: 129.3 kg (285 lb) 128.1 kg (282 lb 6.6 oz)    Examination:  General exam: Appears calm and comfortable  Respiratory system: Clear to auscultation. Respiratory effort normal. Cardiovascular system: S1 & S2 heard, RRR. No JVD, murmurs, rubs, gallops or clicks. No pedal edema. Gastrointestinal system: Abdomen is nondistended, soft and nontender. No organomegaly or masses felt. Normal bowel sounds heard. Central nervous system: Alert and oriented. No focal neurological deficits. Extremities: Symmetric 5 x 5 power. Skin: Right lower extremity with redness, specially around incision site, some induration.    Data Reviewed: I have personally reviewed following labs and imaging studies  CBC:  Recent Labs  Lab 03/04/17 1004 03/05/17 0306  WBC 22.8* 18.3*  NEUTROABS 20.2*  --   HGB 13.9 12.1  HCT 41.8 37.4  MCV 99.5 101.4*  PLT 171 222   Basic Metabolic Panel: Recent Labs  Lab 03/04/17 1004 03/05/17 0306  NA 132* 136  K 3.9 3.9  CL 97* 101  CO2 26 27  GLUCOSE 205* 127*  BUN 12 11  CREATININE 1.16* 1.10*  CALCIUM 8.4* 8.0*   GFR: Estimated Creatinine  Clearance: 64.3 mL/min (A) (by C-G formula based on SCr of 1.1 mg/dL (H)). Liver Function Tests: Recent Labs  Lab 03/04/17 1004  AST 18  ALT 14  ALKPHOS 95  BILITOT 2.1*  PROT 7.3  ALBUMIN 2.9*   No results for input(s): LIPASE, AMYLASE in the last 168 hours. No results for input(s): AMMONIA in the last 168 hours. Coagulation Profile: Recent Labs  Lab 03/03/17 0933 03/04/17 1004 03/05/17 0306  INR 4.7 3.27 2.68   Cardiac Enzymes: No results for input(s): CKTOTAL, CKMB, CKMBINDEX, TROPONINI in the last 168 hours. BNP (last 3 results) No results for input(s): PROBNP in the last 8760 hours. HbA1C: No results for input(s): HGBA1C in the last 72 hours. CBG: No results for input(s): GLUCAP in the last 168 hours. Lipid Profile: No results for input(s): CHOL, HDL, LDLCALC, TRIG, CHOLHDL, LDLDIRECT in the last 72 hours. Thyroid Function Tests: No results for input(s): TSH, T4TOTAL, FREET4, T3FREE, THYROIDAB in the last 72 hours. Anemia Panel: No results for input(s): VITAMINB12, FOLATE, FERRITIN, TIBC, IRON, RETICCTPCT in the last 72 hours. Sepsis Labs: Recent Labs  Lab 03/04/17 1018 03/04/17 1238 03/04/17 1415 03/04/17 1644  LATICACIDVEN 1.85 2.87* 1.4 1.4    Recent Results (from the past 240 hour(s))  Culture, blood (Routine x 2)     Status: None (Preliminary result)   Collection Time: 03/04/17 10:10 AM  Result Value Ref Range Status   Specimen Description BLOOD RIGHT ANTECUBITAL  Final   Special Requests   Final    BOTTLES DRAWN AEROBIC AND ANAEROBIC Blood Culture adequate volume   Culture PENDING  Incomplete   Report Status PENDING  Incomplete         Radiology Studies: Dg Chest Port 1 View  Result Date: 03/04/2017 CLINICAL DATA:  Blood clot RIGHT leg removed after Thanksgiving, now with swelling and redness at site, sepsis, history of heart failure, pulmonary embolism, DVT, hypertension EXAM: PORTABLE CHEST 1 VIEW COMPARISON:  Portable exam 1241 hours  compared to 06/06/2010 FINDINGS: Enlargement of cardiac silhouette. Mediastinal contours and pulmonary vascularity normal. Lungs clear. No pleural effusion or pneumothorax. Bones demineralized. IMPRESSION: Enlargement of cardiac silhouette. No acute abnormalities. Electronically Signed   By: Lavonia Dana M.D.   On: 03/04/2017 13:15        Scheduled Meds: . aspirin EC  81 mg Oral Daily  . cholecalciferol  1,000 Units Oral Daily  . diltiazem  300 mg Oral Daily  . furosemide  40 mg Oral Daily  . metoprolol tartrate  12.5 mg Oral BID  . potassium chloride  10 mEq Oral Daily  . Warfarin - Pharmacist Dosing Inpatient   Does not apply q1800   Continuous Infusions: . sodium chloride 75 mL/hr at 03/04/17 1617  . piperacillin-tazobactam (ZOSYN)  IV 3.375 g (03/05/17 0340)  . vancomycin Stopped (03/05/17 0149)     LOS: 0 days    Time spent: 35 minutes.     Elmarie Shiley, MD [facility] Pager [phone]  If 7PM-7AM, please  contact night-coverage [email or web address] Password [facility] 03/05/2017, 7:34 AM

## 2017-03-05 NOTE — Consult Note (Signed)
Referring Physician: Jerald Kief  Patient name: [name] MRN: [id number] DOB: 1945/08/22 Sex: female  REASON FOR CONSULT: hematoma right leg  HPI: JUEL RIPLEY is a 71 y.o. female s/p right popliteal embolectomy 3 weeks ago. Over last few days has noticed increased swelling erythema right below knee incision.  She had fever yesterday and was admitted for IV antibiotics.  She had a primary repair of the artery with no prosthetic. She is on coumadin for afib and has had multiple prior embolic episodes.  She denies pain in foot bilaterally.    Past Medical History:  Diagnosis Date  . (HFpEF) heart failure with preserved ejection fraction ([place])    a. 06/2008 Echo: EF 55-60%, mild LVH, triv AI, mild to mod MS, mod to sev dil LA, mildly dil RA.  Marland Kitchen Acute right MCA stroke ([place])    a. 10/04/238 Embolic stroke treated with TPA and thrombectomy with hemorrhagic transformation; Carotids 3/12 negative for ICA stenosis.  . Bilateral Pulmonary Emboli    a. 08/2005 - s/p IVC filter.  . Depression   . Embolus of femoral artery ([place])    a. 06/2008 s/p bilateral embolectomies.  . History of DVT (deep vein thrombosis)    a. s/p IVC filter.  Marland Kitchen HIT (heparin-induced thrombocytopenia) ([place])   . HTN (hypertension)   . Lupus anticoagulant disorder ([place])   . Obesity, unspecified   . Permanent atrial fibrillation ([place])    a. On chronic Coumadin - INRs followed by PCP; b. CHA2DS2VASc = 5.  . Popliteal artery embolism, right ([place])    a. 01/2017 in the setting of subRx INR-->s/p embolectomy.   Past Surgical History:  Procedure Laterality Date  . distal radius fracture. (12,/16/2010)    . EMBOLECTOMY Right 02/15/2017   Procedure: EMBOLECTOMY RIGHT LEG;  Surgeon: Elam Dutch, MD;  Location: [facility] OR;  Service: Vascular;  Laterality: Right;  . IVC Placement 08/30/2005    . Open reduction and internal fixation of left intra-articular      Family History  Problem Relation Age of Onset  . Coronary  artery disease Unknown   . Diabetes Unknown     SOCIAL HISTORY: Social History   Socioeconomic History  . Marital status: Single    Spouse name: Not on file  . Number of children: Not on file  . Years of education: Not on file  . Highest education level: Not on file  Social Needs  . Financial resource strain: Not on file  . Food insecurity - worry: Not on file  . Food insecurity - inability: Not on file  . Transportation needs - medical: Not on file  . Transportation needs - non-medical: Not on file  Occupational History  . Not on file  Tobacco Use  . Smoking status: Never Smoker  . Smokeless tobacco: Never Used  Substance and Sexual Activity  . Alcohol use: No  . Drug use: No  . Sexual activity: Not Currently  Other Topics Concern  . Not on file  Social History Narrative    Lives alone.  She works at a [employer].   Daughter in area to assist if needed.  One-level home, 5-6 steps to   entry.  Functional history prior to admission was independent,   functional status upon admission to rehab services was +2, total assist   bed mobility,+2 total assist transfers, ambulation not tested, moderate   assist upper body, total assist lower body, activities of daily living.  Allergies  Allergen Reactions  . Heparin Hives  . Other Itching and Rash    "Sheets and Linens used by Zacarias Pontes"    Current Facility-Administered Medications  Medication Dose Route Frequency Provider Last Rate Last Dose  . 0.9 %  sodium chloride infusion   Intravenous Continuous Regalado, Belk[place] A, MD      . acetaminophen (TYLENOL) tablet 1,000 mg  1,000 mg Oral Q6H PRN Radene Gunning, NP      . aspirin EC tablet 81 mg  81 mg Oral Daily Radene Gunning, NP   81 mg at 03/04/17 1643  . cholecalciferol (VITAMIN D) tablet 1,000 Units  1,000 Units Oral Daily Radene Gunning, NP   1,000 Units at 03/04/17 1643  . diltiazem (CARDIZEM CD) 24 hr capsule 300 mg  300 mg Oral Daily Radene Gunning,  NP   300 mg at 03/04/17 1642  . metoprolol tartrate (LOPRESSOR) tablet 12.5 mg  12.5 mg Oral BID Dyanne Carrel M, NP   12.5 mg at 03/04/17 2247  . piperacillin-tazobactam (ZOSYN) IVPB 3.375 g  3.375 g Intravenous Q8H Alvira Philips, RPH 12.5 mL/hr at 03/05/17 0340 3.375 g at 03/05/17 0340  . vancomycin (VANCOCIN) IVPB 1000 mg/200 mL premix  1,000 mg Intravenous Q12H Alvira Philips, [place] at 03/05/17 0149  . Warfarin - Pharmacist Dosing Inpatient   Does not apply [place]          Physical Examination  Vitals:   03/04/17 1631 03/04/17 1948 03/04/17 2353 03/05/17 0429  BP: (!) 142/125 111/89 116/63 (!) 132/103  Pulse: (!) 106 95 98 86  Resp: (!) 24 (!) 28 (!) 27 (!) 22  Temp: 99 F (37.2 C) 99.1 F (37.3 C)  99.5 F (37.5 C)  TempSrc: Oral Oral  Oral  SpO2:  96% 97%   Weight:    282 lb 6.6 oz (128.1 kg)  Height:        Body mass index is 45.58 kg/m.  General:  Alert and oriented, no acute distress Extremity:  Right foot pink warm.  Fluctuant mass right mid calf under incision with surrouding erythema Musculoskeletal: No deformity or edema  Neurologic: Upper and lower extremity motor 5/5 and symmetric  DATA:   CBC    Component Value Date/Time   WBC 18.3 (H) 03/05/2017 0306   RBC 3.69 (L) 03/05/2017 0306   HGB 12.1 03/05/2017 0306   HCT 37.4 03/05/2017 0306   PLT 164 03/05/2017 0306   MCV 101.4 (H) 03/05/2017 0306   MCH 32.8 03/05/2017 0306   MCHC 32.4 03/05/2017 0306   RDW 14.0 03/05/2017 0306   LYMPHSABS 1.1 03/04/2017 1004   MONOABS 1.5 (H) 03/04/2017 1004   EOSABS 0.1 03/04/2017 1004   BASOSABS 0.0 03/04/2017 1004    BMET    Component Value Date/Time   NA 136 03/05/2017 0306   K 3.9 03/05/2017 0306   CL 101 03/05/2017 0306   CO2 27 03/05/2017 0306   GLUCOSE 127 (H) 03/05/2017 0306   BUN 11 03/05/2017 0306   CREATININE 1.10 (H) 03/05/2017 0306   CALCIUM 8.0 (L) 03/05/2017 0306   GFRNONAA 49 (L) 03/05/2017 0306    GFRAA 57 (L) 03/05/2017 0306    INR 2.7   ASSESSMENT:  Hematoma versus infection right calf   PLAN:  Reverse coumadin with FFP now vitamin K now Possible I and D later today or tomorrow Keep NPO for now   Ruta Hinds, MD  [facility] [place] Office: [phone] Pager: [phone]

## 2017-03-05 NOTE — Progress Notes (Signed)
Pt transporting to OR. Second unit FFP transfusing. Report given to OR.    Grant Fontana BSN, RN

## 2017-03-05 NOTE — Progress Notes (Addendum)
ANTICOAGULATION CONSULT NOTE - Initial Consult  Pharmacy Consult for agratroban Indication: atrial fibrillation  Allergies  Allergen Reactions  . Heparin Hives    Patient attests severe allergy- rash all over, severe itching, unable to take heparin  . Other Itching and Rash    "Sheets and Linens used by Zacarias Pontes"    Patient Measurements: Height: 5\' 6"  (167.6 cm) Weight: 282 lb 6.6 oz (128.1 kg) IBW/kg (Calculated) : 59.3  Vital Signs: Temp: 98.7 F (37.1 C) (12/09 1437) Temp Source: Oral (12/09 1437) BP: 114/54 (12/09 1437) Pulse Rate: 62 (12/09 1437)  Labs: Recent Labs    03/04/17 1004 03/05/17 0306 03/05/17 1546  HGB 13.9 12.1  --   HCT 41.8 37.4  --   PLT 171 164  --   LABPROT 33.0* 28.3* 18.7*  INR 3.27 2.68 1.57  CREATININE 1.16* 1.10*  --     Estimated Creatinine Clearance: 64.3 mL/min (A) (by C-G formula based on SCr of 1.1 mg/dL (H)).   Medical History: Past Medical History:  Diagnosis Date  . (HFpEF) heart failure with preserved ejection fraction ([place])    a. 06/2008 Echo: EF 55-60%, mild LVH, triv AI, mild to mod MS, mod to sev dil LA, mildly dil RA.  Marland Kitchen Acute right MCA stroke ([place])    a. 07/29/84 Embolic stroke treated with TPA and thrombectomy with hemorrhagic transformation; Carotids 3/12 negative for ICA stenosis.  . Bilateral Pulmonary Emboli    a. 08/2005 - s/p IVC filter.  . Depression   . Embolus of femoral artery ([place])    a. 06/2008 s/p bilateral embolectomies.  . History of DVT (deep vein thrombosis)    a. s/p IVC filter.  Marland Kitchen HIT (heparin-induced thrombocytopenia) ([place])   . HTN (hypertension)   . Lupus anticoagulant disorder ([place])   . Obesity, unspecified   . Permanent atrial fibrillation ([place])    a. On chronic Coumadin - INRs followed by PCP; b. CHA2DS2VASc = 5.  . Popliteal artery embolism, right ([place])    a. 01/2017 in the setting of subRx INR-->s/p embolectomy.    Medications:  Scheduled:  . aspirin EC  81 mg Oral Daily  .  cholecalciferol  1,000 Units Oral Daily  . diltiazem  300 mg Oral Daily  . metoprolol tartrate  12.5 mg Oral BID    Assessment: 43 yof is admitted on atrial fibrillation on warfarin for prior embolic events. She was found to have increased swelling and erythema right below knee incision and underwent I&D of right below-knee popliteal incision. PTA regimen shows 6 mg daily except 4.5 mg daily with INR goal of 2.5-3.5 noted on anticoagulation clinic visit summary on 12/7.  INR was 3.27 on admission and received 6 mg warfarin dose. INR next morning was 2.68. Received 10 mg vitamin K for I&D and repeat INR came back at 1.57 this afternoon. Patient attests to severe allergy with heparin (of note, not related to heparin antibody which was negative upon last collection) so pharmacy consulted to dose argatroban instead starting at 1900 following I&D. LFTs are within normal limits. Hgb was 12.1 and platelets are stable.   Goal of Therapy:  aPTT 50-90 seconds Monitor platelets by anticoagulation protocol: Yes   Plan:  Will start argatroban 1 mcg/kg/min at 1900 on 12/9 Will obtain aPTT 2 hours after infusion started Monitor daily CBC and aPTT  Doylene Canard, PharmD Clinical Pharmacist  Pager: [phone] Phone: [phone] 03/05/2017,6:10 PM    ADDENDUM First aPTT in range at 81 seconds. No  bleeding noted.  Plan: Continue Argatroban at 28mcg/kg/min Confirm aPTT in 2 hours Daily aPTT and CBC  Sameena Artus D. Javis Abboud, PharmD, BCPS Clinical Pharmacist 03/05/2017 9:53 PM

## 2017-03-05 NOTE — Transfer of Care (Signed)
Immediate Anesthesia Transfer of Care Note  Patient: [name]  Procedure(s) Performed: IRRIGATION AND DEBRIDEMENT RIGHT LEG HEMATOMA EVACUATION (Right Leg Lower)  Patient Location: PACU  Anesthesia Type:General  Level of Consciousness: awake, alert , oriented and patient cooperative  Airway & Oxygen Therapy: Patient Spontanous Breathing and Patient connected to nasal cannula oxygen  Post-op Assessment: Report given to RN and Post -op Vital signs reviewed and stable  Post vital signs: Reviewed and stable  Last Vitals:  Vitals:   03/05/17 1125 03/05/17 1300  BP: 130/79   Pulse: 75 89  Resp: (!) 22 (!) 34  Temp: 36.9 C (!) 36.2 C  SpO2: 97%     Last Pain:  Vitals:   03/05/17 1125  TempSrc: Oral  PainSc:          Complications: No apparent anesthesia complications

## 2017-03-05 NOTE — Op Note (Signed)
Procedure: Incision and drainage right below-knee popliteal incision  Preoperative diagnosis: Right leg wound infection  Postoperative diagnosis: Same  Anesthesia: Gen.  Assistant: Leontine Locket PA-C  Operative findings: #1 large abscess right below-knee popliteal space #2 culture sent anaerobic and aerobic  Operative details: After obtaining informed consent, the patient was taken to the operating room. The patient was placed in supine position on the operating room table. After induction of general anesthesia and placement of a laryngeal mask the patient's entire right lower extremity is prepped and draped in usual sterile fashion. Next a pre-existing incision on the medial aspect of the right calf was reopened and carried down to the subcutaneous tissues. The popliteal space was entered and there was a large purulent collection. There was approximate 500 cc of purulent material within the popliteal space. This was all fully evacuated. The wound was then thoroughly irrigated with 1 L of normal saline solution. The wound was then packed open with saline moistened Kerlix. A dry sterile dressing was applied. The patient started procedure well and there were no complications. Instrument sponge and needle count was correct in the case. The patient was taken to recovery in stable condition.  Ruta Hinds, MD [facility] [place] Office: [phone] Pager: [phone]

## 2017-03-05 NOTE — Anesthesia Procedure Notes (Signed)
Procedure Name: Intubation Date/Time: 03/05/2017 12:27 PM Performed by: Clearnce Sorrel, CRNA Pre-anesthesia Checklist: Patient identified, Emergency Drugs available, Suction available and Patient being monitored Patient Re-evaluated:Patient Re-evaluated prior to induction Oxygen Delivery Method: Circle System Utilized Preoxygenation: Pre-oxygenation with 100% oxygen Induction Type: IV induction, Rapid sequence and Cricoid Pressure applied Laryngoscope Size: Mac and 3 Grade View: Grade I Tube type: Oral Number of attempts: 1 Airway Equipment and Method: Stylet Placement Confirmation: ETT inserted through vocal cords under direct vision,  positive ETCO2 and breath sounds checked- equal and bilateral Secured at: 22 cm Tube secured with: Tape Dental Injury: Teeth and Oropharynx as per pre-operative assessment

## 2017-03-05 NOTE — Anesthesia Preprocedure Evaluation (Signed)
Anesthesia Evaluation  Patient identified by MRN, date of birth, ID band Patient awake    Reviewed: Allergy & Precautions, NPO status , Patient's Chart, lab work & pertinent test results  History of Anesthesia Complications Negative for: history of anesthetic complications  Airway Mallampati: II  TM Distance: <3 FB Neck ROM: Full    Dental  (+) Partial Upper, Missing, Dental Advisory Given   Pulmonary neg pulmonary ROS,    breath sounds clear to auscultation       Cardiovascular hypertension, Pt. on home beta blockers and Pt. on medications + Peripheral Vascular Disease  + dysrhythmias Atrial Fibrillation  Rhythm:Regular Rate:Normal     Neuro/Psych PSYCHIATRIC DISORDERS Depression CVA    GI/Hepatic negative GI ROS, Neg liver ROS,   Endo/Other  negative endocrine ROS  Renal/GU Renal InsufficiencyRenal disease     Musculoskeletal negative musculoskeletal ROS (+)   Abdominal (+) + obese,   Peds  Hematology negative hematology ROS (+)   Anesthesia Other Findings Day of surgery medications reviewed with the patient.  Reproductive/Obstetrics                             Anesthesia Physical Anesthesia Plan  ASA: II  Anesthesia Plan: General   Post-op Pain Management:    Induction: Intravenous, Rapid sequence and Cricoid pressure planned  PONV Risk Score and Plan: 3  Airway Management Planned: Oral ETT  Additional Equipment: None  Intra-op Plan:   Post-operative Plan: Extubation in OR  Informed Consent: I have reviewed the patients History and Physical, chart, labs and discussed the procedure including the risks, benefits and alternatives for the proposed anesthesia with the patient or authorized representative who has indicated his/her understanding and acceptance.   Dental advisory given  Plan Discussed with: CRNA and Surgeon  Anesthesia Plan Comments:         Anesthesia  Quick Evaluation

## 2017-03-06 ENCOUNTER — Encounter (HOSPITAL_COMMUNITY): Payer: Self-pay | Admitting: Vascular Surgery

## 2017-03-06 LAB — BASIC METABOLIC PANEL
ANION GAP: 7 (ref 5–15)
BUN: 14 mg/dL (ref 6–20)
CALCIUM: 8.3 mg/dL — AB (ref 8.9–10.3)
CO2: 27 mmol/L (ref 22–32)
CREATININE: 1.02 mg/dL — AB (ref 0.44–1.00)
Chloride: 102 mmol/L (ref 101–111)
GFR calc non Af Amer: 54 mL/min — ABNORMAL LOW (ref >60)
Glucose, Bld: 227 mg/dL — ABNORMAL HIGH (ref 65–99)
Potassium: 3.8 mmol/L (ref 3.5–5.1)
SODIUM: 136 mmol/L (ref 135–145)

## 2017-03-06 LAB — BPAM FFP
BLOOD PRODUCT EXPIRATION DATE: 201812112359
BLOOD PRODUCT EXPIRATION DATE: 201812112359
ISSUE DATE / TIME: 201812091000
ISSUE DATE / TIME: 201812091102
UNIT TYPE AND RH: 6200
UNIT TYPE AND RH: 6200

## 2017-03-06 LAB — PREPARE FRESH FROZEN PLASMA
UNIT DIVISION: 0
UNIT DIVISION: 0

## 2017-03-06 LAB — CBC
HCT: 35.5 % — ABNORMAL LOW (ref 36.0–46.0)
HEMOGLOBIN: 11.6 g/dL — AB (ref 12.0–15.0)
MCH: 32.7 pg (ref 26.0–34.0)
MCHC: 32.7 g/dL (ref 30.0–36.0)
MCV: 100 fL (ref 78.0–100.0)
PLATELETS: 162 10*3/uL (ref 150–400)
RBC: 3.55 MIL/uL — AB (ref 3.87–5.11)
RDW: 13.5 % (ref 11.5–15.5)
WBC: 12.9 10*3/uL — AB (ref 4.0–10.5)

## 2017-03-06 LAB — APTT
APTT: 111 s — AB (ref 24–36)
APTT: 108 s — AB (ref 24–36)
APTT: 89 s — AB (ref 24–36)
APTT: 66 s — AB (ref 24–36)

## 2017-03-06 LAB — VANCOMYCIN, TROUGH: VANCOMYCIN TR: 18 ug/mL (ref 15–20)

## 2017-03-06 LAB — PROTIME-INR
INR: 5.82
PROTHROMBIN TIME: 51.9 s — AB (ref 11.4–15.2)

## 2017-03-06 MED ORDER — VANCOMYCIN HCL IN DEXTROSE 750-5 MG/150ML-% IV SOLN
750.0000 mg | Freq: Two times a day (BID) | INTRAVENOUS | Status: DC
  Administered 2017-03-07 – 2017-03-11 (×10): 750 mg via INTRAVENOUS
  Filled 2017-03-06 (×10): qty 150

## 2017-03-06 NOTE — Progress Notes (Addendum)
  Progress Note    03/06/2017 7:32 AM 1 Day Post-Op  Subjective:  Sleeping-no complaints  Afebrile HR 40's-80's Afib 235'T systolic 61% RA  Vitals:   03/05/17 2106 03/06/17 0409  BP: 124/77 124/74  Pulse: 81 (!) 50  Resp: (!) 21 19  Temp: 97.9 F (36.6 C) 97.9 F (36.6 C)  SpO2: 96% 96%   CBC    Component Value Date/Time   WBC 12.9 (H) 03/06/2017 0311   RBC 3.55 (L) 03/06/2017 0311   HGB 11.6 (L) 03/06/2017 0311   HCT 35.5 (L) 03/06/2017 0311   PLT 162 03/06/2017 0311   MCV 100.0 03/06/2017 0311   MCH 32.7 03/06/2017 0311   MCHC 32.7 03/06/2017 0311   RDW 13.5 03/06/2017 0311   LYMPHSABS 1.1 03/04/2017 1004   MONOABS 1.5 (H) 03/04/2017 1004   EOSABS 0.1 03/04/2017 1004   BASOSABS 0.0 03/04/2017 1004    BMET    Component Value Date/Time   NA 136 03/06/2017 0311   K 3.8 03/06/2017 0311   CL 102 03/06/2017 0311   CO2 27 03/06/2017 0311   GLUCOSE 227 (H) 03/06/2017 0311   BUN 14 03/06/2017 0311   CREATININE 1.02 (H) 03/06/2017 0311   CALCIUM 8.3 (L) 03/06/2017 0311   GFRNONAA 54 (L) 03/06/2017 0311   GFRAA >60 03/06/2017 0311    INR    Component Value Date/Time   INR 5.82 (HH) 03/06/2017 0311   INR 2.2 05/07/2010 1039     Intake/Output Summary (Last 24 hours) at 03/06/2017 0732 Last data filed at 03/06/2017 0451 Gross per 24 hour  Intake 1116.75 ml  Output 515 ml  Net 601.75 ml   Wound Culture 03/05/17: Specimen Description LEG RIGHT   Special Requests NONE   Gram Stain MODERATE WBC PRESENT, PREDOMINANTLY PMN  RARE GRAM POSITIVE COCCI IN PAIRS     Culture PENDING   Report Status PENDING      Assessment:  71 y.o. female is s/p:  Incision and drainage right below-knee popliteal incision  1 Day Post-Op  Plan: -will change dressing later this morning-supplies are not available on the floor.  Discussed with pt that we will be back mid morning to change dressing. -DVT prophylaxis:  Argatroban-INR is 5.82, however, Argatroban falsely  elevated this level.   -leukocytosis improved this am.  Wound cx with rare GPC in pairs and moderate WBC; blood cx have no growth to date. -continue abx.   Leontine Locket, PA-C [facility] [phone] 03/06/2017 7:32 AM  ADDENDUM:  Packing removed and wound is clean without any purulence.  Repacked with saline soaked kerlix and wrapped.  Will take dressing down again tomorrow.  Pt tolerated this well.    Leontine Locket, [facility] 03/06/2017 11:54 AM   Agree with above.  Wound clean. Will do local wound care and antibiotics for now.  Maybe VAC if clean in 48 hours.  Ruta Hinds, MD [facility] [place] Office: [phone] Pager: [phone]

## 2017-03-06 NOTE — Progress Notes (Signed)
[place] for agratroban Indication: atrial fibrillation  Allergies  Allergen Reactions  . Heparin Hives    Patient attests severe allergy- rash all over, severe itching, unable to take heparin  . Other Itching and Rash    "Sheets and Linens used by [employer] Pontes"    Patient Measurements: Height: 5\' 6"  (167.6 cm) Weight: 284 lb 2.8 oz (128.9 kg) IBW/kg (Calculated) : 59.3  Vital Signs: Temp: 97.6 F (36.4 C) (12/10 0747) Temp Source: Oral (12/10 0747) BP: 105/88 (12/10 0839) Pulse Rate: 60 (12/10 0840)  Labs: Recent Labs    03/04/17 1004 03/05/17 0306 03/05/17 1546  03/06/17 0311 03/06/17 0834 03/06/17 1258  HGB 13.9 12.1  --   --  11.6*  --   --   HCT 41.8 37.4  --   --  35.5*  --   --   PLT 171 164  --   --  162  --   --   APTT  --   --   --    < > 108* 89* 66*  LABPROT 33.0* 28.3* 18.7*  --  51.9*  --   --   INR 3.27 2.68 1.57  --  5.82*  --   --   CREATININE 1.16* 1.10*  --   --  1.02*  --   --    < > = values in this interval not displayed.    Estimated Creatinine Clearance: 69.6 mL/min (A) (by C-G formula based on SCr of 1.02 mg/dL (H)).   Medical History: Past Medical History:  Diagnosis Date  . (HFpEF) heart failure with preserved ejection fraction ([place])    a. 06/2008 Echo: EF 55-60%, mild LVH, triv AI, mild to mod MS, mod to sev dil LA, mildly dil RA.  Marland Kitchen Acute right MCA stroke ([place])    a. 05/02/8525 Embolic stroke treated with TPA and thrombectomy with hemorrhagic transformation; Carotids 3/12 negative for ICA stenosis.  . Bilateral Pulmonary Emboli    a. 08/2005 - s/p IVC filter.  . Depression   . Embolus of femoral artery ([place])    a. 06/2008 s/p bilateral embolectomies.  . History of DVT (deep vein thrombosis)    a. s/p IVC filter.  Marland Kitchen HIT (heparin-induced thrombocytopenia) ([place])   . HTN (hypertension)   . Lupus anticoagulant disorder ([place])   . Obesity, unspecified   . Permanent atrial fibrillation ([place])    a.  On chronic Coumadin - INRs followed by PCP; b. CHA2DS2VASc = 5.  . Popliteal artery embolism, right ([place])    a. 01/2017 in the setting of subRx INR-->s/p embolectomy.    Medications:  Scheduled:  . aspirin EC  81 mg Oral Daily  . cholecalciferol  1,000 Units Oral Daily  . diltiazem  300 mg Oral Daily  . metoprolol tartrate  12.5 mg Oral BID    Assessment: 64 yof is admitted on atrial fibrillation on warfarin for prior embolic events. She was found to have increased swelling and erythema right below knee incision and underwent I&D of right below-knee popliteal incision on 12/9.  She is noted s/p vitamin K 10mg  IV 12/9 and is now on argatroban due to a heparin allergy.  -aPTT= 66 on 0.3 mcg/kg/min  Goal of Therapy:  aPTT 50-90 seconds Monitor platelets by anticoagulation protocol: Yes   Plan:  -No argatroban dose changes needed -Daily aPTT -Will follow anticoagulation plans  Hildred Laser, Pharm D 03/06/2017 1:52 PM

## 2017-03-06 NOTE — Progress Notes (Signed)
[place] for agratroban Indication: atrial fibrillation  Allergies  Allergen Reactions  . Heparin Hives    Patient attests severe allergy- rash all over, severe itching, unable to take heparin  . Other Itching and Rash    "Sheets and Linens used by [employer] Pontes"    Patient Measurements: Height: 5\' 6"  (167.6 cm) Weight: 284 lb 2.8 oz (128.9 kg) IBW/kg (Calculated) : 59.3  Vital Signs: Temp: 97.6 F (36.4 C) (12/10 0747) Temp Source: Oral (12/10 0747) BP: 105/88 (12/10 0839) Pulse Rate: 60 (12/10 0840)  Labs: Recent Labs    03/04/17 1004 03/05/17 0306 03/05/17 1546  03/05/17 2309 03/06/17 0311 03/06/17 0834  HGB 13.9 12.1  --   --   --  11.6*  --   HCT 41.8 37.4  --   --   --  35.5*  --   PLT 171 164  --   --   --  162  --   APTT  --   --   --    < > 111* 108* 89*  LABPROT 33.0* 28.3* 18.7*  --   --  51.9*  --   INR 3.27 2.68 1.57  --   --  5.82*  --   CREATININE 1.16* 1.10*  --   --   --  1.02*  --    < > = values in this interval not displayed.    Estimated Creatinine Clearance: 69.6 mL/min (A) (by C-G formula based on SCr of 1.02 mg/dL (H)).   Medical History: Past Medical History:  Diagnosis Date  . (HFpEF) heart failure with preserved ejection fraction ([place])    a. 06/2008 Echo: EF 55-60%, mild LVH, triv AI, mild to mod MS, mod to sev dil LA, mildly dil RA.  Marland Kitchen Acute right MCA stroke ([place])    a. 10/29/3823 Embolic stroke treated with TPA and thrombectomy with hemorrhagic transformation; Carotids 3/12 negative for ICA stenosis.  . Bilateral Pulmonary Emboli    a. 08/2005 - s/p IVC filter.  . Depression   . Embolus of femoral artery ([place])    a. 06/2008 s/p bilateral embolectomies.  . History of DVT (deep vein thrombosis)    a. s/p IVC filter.  Marland Kitchen HIT (heparin-induced thrombocytopenia) ([place])   . HTN (hypertension)   . Lupus anticoagulant disorder ([place])   . Obesity, unspecified   . Permanent atrial fibrillation ([place])    a.  On chronic Coumadin - INRs followed by PCP; b. CHA2DS2VASc = 5.  . Popliteal artery embolism, right ([place])    a. 01/2017 in the setting of subRx INR-->s/p embolectomy.    Medications:  Scheduled:  . aspirin EC  81 mg Oral Daily  . cholecalciferol  1,000 Units Oral Daily  . diltiazem  300 mg Oral Daily  . metoprolol tartrate  12.5 mg Oral BID    Assessment: 37 yof is admitted on atrial fibrillation on warfarin for prior embolic events. She was found to have increased swelling and erythema right below knee incision and underwent I&D of right below-knee popliteal incision on 12/9.   She is noted s/p vitamin K 10mg  IV 12/9 and is now on argatroban due to a heparin allergy. APTT= 89 and at goal. INR is 5.82 which is a false elevation due to argatroban. Spoke with Dr. Oneida Alar and continuing to hold coumadin at this time  -PTA regimen shows 6 mg daily except 4.5 mg daily with INR goal of 2.5-3.5 noted on anticoagulation clinic visit  summary on 12/7.   Goal of Therapy:  aPTT 50-90 seconds Monitor platelets by anticoagulation protocol: Yes   Plan:  -No argatroban dose changes needed -Confirm aPTT today -Daily aPTT -Will follow anticoagulation plans  Hildred Laser, Pharm D 03/06/2017 9:48 AM

## 2017-03-06 NOTE — Progress Notes (Signed)
Pharmacy Antibiotic Note  [name] is a 71 y.o. female  with cellulitis (s/p I&D on 12/9) on vancomycin and zosyn (day 3 of therapy) .   -WBC= 12.9, afeb, SCr= 1.02, CrCL ~ 70 -vancomycin trough= 18 (goal 10-15)  Plan: -Change vancomycin to 750mg  IV q12h -Continue Zosyn 3.375gm IV q8h -Will follow renal function, cultures and clinical progress   Height: 5\' 6"  (167.6 cm) Weight: 284 lb 2.8 oz (128.9 kg) IBW/kg (Calculated) : 59.3  Temp (24hrs), Avg:97.9 F (36.6 C), Min:97.2 F (36.2 C), Max:98.7 F (37.1 C)  Recent Labs  Lab 03/04/17 1004 03/04/17 1018 03/04/17 1238 03/04/17 1415 03/04/17 1644 03/05/17 0306 03/06/17 0311 03/06/17 1303  WBC 22.8*  --   --   --   --  18.3* 12.9*  --   CREATININE 1.16*  --   --   --   --  1.10* 1.02*  --   LATICACIDVEN  --  1.85 2.87* 1.4 1.4  --   --   --   VANCOTROUGH  --   --   --   --   --   --   --  18    Estimated Creatinine Clearance: 69.6 mL/min (A) (by C-G formula based on SCr of 1.02 mg/dL (H)).    Allergies  Allergen Reactions  . Heparin Hives    Patient attests severe allergy- rash all over, severe itching, unable to take heparin  . Other Itching and Rash    "Sheets and Linens used by [employer]ac[employer] Pontes"    Antimicrobials this admission: Vanc 12/8 >> Zosyn 12/8 >>  Microbiology results: 12/8BCx:NGTD 12/9 R leg cx: rare GPC in pairs  Thank you for allowing pharmacy to be a part of this patient's care.  Hildred Laser, Pharm D 03/06/2017 2:02 PM

## 2017-03-06 NOTE — Progress Notes (Signed)
PROGRESS NOTE    Melody BEZDEK  ZOX:[id number] DOB: 31-Oct-1945 DOA: 03/04/2017 PCP: Josetta Huddle, MD    Brief Narrative:  [name] is a 71 y.o. female with medical history significant A. fib on Coumadin and prior embolic events, stroke, hypertension, recent popliteal artery embolism causing ischemia right leg in the setting of subtherapeutic INR and lupus anticoagulant, status post embolectomy 2 weeks ago presents to the emergency department with the chief complaint of 2 day history of right leg swelling. Initial evaluation reveals tachycardia leukocytosis elevated lactic acid. [facility] are asked to admit    Assessment & Plan:   Principal Problem:   Sepsis ([place]) Active Problems:   Obesity, unspecified   ATRIAL FIBRILLATION   Popliteal artery embolus ([facility])   Cellulitis   1-Sepsis; Presents with fever, leukocytosis, lactic acidosis. Tachycardia.  Likely related to infectious process at embolectomy site on right LE Lactic acid peak to 2.8, it has decreased to 1.4. WBC trending down from 22 to 12.  Continue with IV antibiotics. Vancomycin.  Underwent incision and drainage of abscess right below knee popliteal incision. Marland Kitchen   2-Right lower extremity Cellulitis; redness at incision site,  Continue with IV vancomycin and Zosyn.  Doppler; Area of mix echoes in the proximal mid calf, near the surgical site.  Dr Eden Lathe consulted.  Underwent incision and drainage of abscess right below knee popliteal incision on 12-09. Continue with IV antibiotics , follow culture,. Rare gram positive cocci in pairs.   Hyponatremia, mild; resolved with IV fluids.   Hyperbilirubinemia; repeat labs in am.   AKI; in setting of sepsis. Last cr per records at 0.8.  Continue with IV fluids.  UA with positives nitrates. Already on IV antibiotics.  Hold lasix.   A fib; on coumadin at home , metoprolol and Cardizem.  Started on argatroban post sx. Heparin allergy  Chronic Diastolic HF;    Hold lasix. Plan to resume lasix tomorrow.  Compensated.   Ploplitial artery embolus S/P embolectomy.  Performed by Dr Eden Lathe.     DVT prophylaxis:coumadin.  Code Status: full code Family Communication: care discussed with patient.  Disposition Plan: Remain inpatient.   Consultants:   Vascular.    Procedures:  none  Antimicrobials:  Vancomycin 12-09 Zosyn 12-09  Subjective: She is feeling better, denies dyspnea, cough  Objective: Vitals:   03/05/17 1437 03/05/17 2106 03/06/17 0409 03/06/17 0747  BP: (!) 114/54 124/77 124/74 105/88  Pulse: 62 81 (!) 50 (!) 57  Resp: 20 (!) 21 19 (!) 24  Temp: 98.7 F (37.1 C) 97.9 F (36.6 C) 97.9 F (36.6 C) 97.6 F (36.4 C)  TempSrc: Oral Oral Oral Oral  SpO2: 92% 96% 96% 97%  Weight:   128.9 kg (284 lb 2.8 oz)   Height:        Intake/Output Summary (Last 24 hours) at 03/06/2017 0831 Last data filed at 03/06/2017 0800 Gross per 24 hour  Intake 1356.75 ml  Output 515 ml  Net 841.75 ml   Filed Weights   03/04/17 1003 03/05/17 0429 03/06/17 0409  Weight: 129.3 kg (285 lb) 128.1 kg (282 lb 6.6 oz) 128.9 kg (284 lb 2.8 oz)    Examination:  General exam: NAD Respiratory system: CTA, normal respiratory effort.  Cardiovascular system: S 1, S 2 RRR Gastrointestinal system: BS present, soft, nt Central nervous system: alert, non focal.  Extremities: symmetric power.  Skin: right lower extremity with dressing.   Data Reviewed: I have personally reviewed following labs and imaging  studies  CBC: Recent Labs  Lab 03/04/17 1004 03/05/17 0306 03/06/17 0311  WBC 22.8* 18.3* 12.9*  NEUTROABS 20.2*  --   --   HGB 13.9 12.1 11.6*  HCT 41.8 37.4 35.5*  MCV 99.5 101.4* 100.0  PLT 171 164 725   Basic Metabolic Panel: Recent Labs  Lab 03/04/17 1004 03/05/17 0306 03/06/17 0311  NA 132* 136 136  K 3.9 3.9 3.8  CL 97* 101 102  CO2 26 27 27   GLUCOSE 205* 127* 227*  BUN 12 11 14   CREATININE 1.16* 1.10* 1.02*   CALCIUM 8.4* 8.0* 8.3*   GFR: Estimated Creatinine Clearance: 69.6 mL/min (A) (by C-G formula based on SCr of 1.02 mg/dL (H)). Liver Function Tests: Recent Labs  Lab 03/04/17 1004  AST 18  ALT 14  ALKPHOS 95  BILITOT 2.1*  PROT 7.3  ALBUMIN 2.9*   No results for input(s): LIPASE, AMYLASE in the last 168 hours. No results for input(s): AMMONIA in the last 168 hours. Coagulation Profile: Recent Labs  Lab 03/03/17 0933 03/04/17 1004 03/05/17 0306 03/05/17 1546 03/06/17 0311  INR 4.7 3.27 2.68 1.57 5.82*   Cardiac Enzymes: No results for input(s): CKTOTAL, CKMB, CKMBINDEX, TROPONINI in the last 168 hours. BNP (last 3 results) No results for input(s): PROBNP in the last 8760 hours. HbA1C: No results for input(s): HGBA1C in the last 72 hours. CBG: No results for input(s): GLUCAP in the last 168 hours. Lipid Profile: No results for input(s): CHOL, HDL, LDLCALC, TRIG, CHOLHDL, LDLDIRECT in the last 72 hours. Thyroid Function Tests: No results for input(s): TSH, T4TOTAL, FREET4, T3FREE, THYROIDAB in the last 72 hours. Anemia Panel: No results for input(s): VITAMINB12, FOLATE, FERRITIN, TIBC, IRON, RETICCTPCT in the last 72 hours. Sepsis Labs: Recent Labs  Lab 03/04/17 1018 03/04/17 1238 03/04/17 1415 03/04/17 1644  LATICACIDVEN 1.85 2.87* 1.4 1.4    Recent Results (from the past 240 hour(s))  Culture, blood (Routine x 2)     Status: None (Preliminary result)   Collection Time: 03/04/17 10:10 AM  Result Value Ref Range Status   Specimen Description BLOOD RIGHT ANTECUBITAL  Final   Special Requests   Final    BOTTLES DRAWN AEROBIC AND ANAEROBIC Blood Culture adequate volume   Culture NO GROWTH 1 DAY  Final   Report Status PENDING  Incomplete  Culture, blood (Routine x 2)     Status: None (Preliminary result)   Collection Time: 03/04/17 12:20 PM  Result Value Ref Range Status   Specimen Description BLOOD RIGHT HAND  Final   Special Requests   Final    BOTTLES  DRAWN AEROBIC ONLY Blood Culture adequate volume   Culture NO GROWTH 1 DAY  Final   Report Status PENDING  Incomplete  Aerobic/Anaerobic Culture (surgical/deep wound)     Status: None (Preliminary result)   Collection Time: 03/05/17 12:44 PM  Result Value Ref Range Status   Specimen Description LEG RIGHT  Final   Special Requests NONE  Final   Gram Stain   Final    MODERATE WBC PRESENT, PREDOMINANTLY PMN RARE GRAM POSITIVE COCCI IN PAIRS    Culture PENDING  Incomplete   Report Status PENDING  Incomplete         Radiology Studies: Dg Chest Port 1 View  Result Date: 03/04/2017 CLINICAL DATA:  Blood clot RIGHT leg removed after Thanksgiving, now with swelling and redness at site, sepsis, history of heart failure, pulmonary embolism, DVT, hypertension EXAM: PORTABLE CHEST 1 VIEW COMPARISON:  Portable  exam 1241 hours compared to 06/06/2010 FINDINGS: Enlargement of cardiac silhouette. Mediastinal contours and pulmonary vascularity normal. Lungs clear. No pleural effusion or pneumothorax. Bones demineralized. IMPRESSION: Enlargement of cardiac silhouette. No acute abnormalities. Electronically Signed   By: Lavonia Dana M.D.   On: 03/04/2017 13:15        Scheduled Meds: . aspirin EC  81 mg Oral Daily  . cholecalciferol  1,000 Units Oral Daily  . diltiazem  300 mg Oral Daily  . metoprolol tartrate  12.5 mg Oral BID   Continuous Infusions: . sodium chloride 75 mL/hr at 03/05/17 1443  . argatroban 0.3 mcg/kg/min (03/06/17 0451)  . piperacillin-tazobactam (ZOSYN)  IV 3.375 g (03/06/17 0451)  . vancomycin Stopped (03/06/17 0220)     LOS: 1 day    Time spent: 35 minutes.     Elmarie Shiley, MD [facility] Pager [phone]  If 7PM-7AM, please contact night-coverage [email or web address] Password [email or web address] 03/06/2017, 8:31 AM

## 2017-03-06 NOTE — Progress Notes (Addendum)
Physical Therapy Evaluation Patient Details Name: [name] MRN: [id number] DOB: Jun 21, 1945 Today's Date: 03/06/2017   History of Present Illness  Patient is a 71 y/o female admitted on 03/04/17 due to 2 day history of R LE swelling s/p embelectomy 2 weeks prior. Dx with large abscess in R below-knee popliteal space s/p I and D on 03/05/17. Patient with a PMHx significant for A. fib on Coumadin and prior embolic events, stroke, hypertension, recent popliteal artery embolism causing ischemia right leg in the setting of subtherapeutic INR and lupus anticoagulant.  Clinical Impression  Patient admitted with the above listed diagnosis. Patient motivated to work with PT today. Patient today with some LE weakness as well as difficulty performing bed mobility and gait independently. Patient with Min A for bed mobility and transfers from low surfaces, and ambulating over 100 feet with Min Guard for general safety. Will benefit from skilled PT to address functional mobility prior to d/c.     Follow Up Recommendations Supervision for mobility/OOB    Equipment Recommendations  None recommended by PT    Recommendations for Other Services       Precautions / Restrictions Precautions Precautions: Fall Restrictions Weight Bearing Restrictions: No      Mobility  Bed Mobility Overal bed mobility: Needs Assistance Bed Mobility: Supine to Sit     Supine to sit: Min assist     General bed mobility comments: patient supine in bed - primary use of PT to pull to EOB  Transfers Overall transfer level: Needs assistance Equipment used: Rolling walker (2 wheeled) Transfers: Sit to/from Stand Sit to Stand: Min assist         General transfer comment: sit to stand from bed and low toilet - Min A from PT and use of grab bars in bathroom  Ambulation/Gait Ambulation/Gait assistance: Min guard Ambulation Distance (Feet): 125 Feet Assistive device: Rolling walker (2 wheeled);None Gait  Pattern/deviations: Step-through pattern;Decreased stride length;Decreased weight shift to right;Trunk flexed;Antalgic Gait velocity: Decreased   General Gait Details: Min Guard for general safety; VC for turning sequence for safety  Stairs            Wheelchair Mobility    Modified Rankin (Stroke Patients Only)       Balance Overall balance assessment: Needs assistance Sitting-balance support: Feet supported Sitting balance-Leahy Scale: Good     Standing balance support: Bilateral upper extremity supported;During functional activity Standing balance-Leahy Scale: Fair                               Pertinent Vitals/Pain Pain Assessment: No/denies pain Pain Score: 0-No pain Pain Location: R LE "soreness" with ambulation - does not rate on VAS     Home Living Family/patient expects to be discharged to:: Private residence Living Arrangements: Children Available Help at Discharge: Family;Available PRN/intermittently Type of Home: House Home Access: Stairs to enter Entrance Stairs-Rails: Right Entrance Stairs-Number of Steps: 2 Home Layout: Two level;Able to live on main level with bedroom/bathroom Home Equipment: Gilford Rile - 2 wheels;Cane - single point;Shower seat;Grab bars - tub/shower;Hand held shower head      Prior Function Level of Independence: Independent with assistive device(s)         Comments: Occasional use of cane if going somewhere unfamilliar. Works 4 days a week with primary need for sitting.      Hand Dominance   Dominant Hand: Right    Extremity/Trunk Assessment  Lower Extremity Assessment Lower Extremity Assessment: Generalized weakness       Communication   Communication: No difficulties  Cognition Arousal/Alertness: Awake/alert Behavior During Therapy: WFL for tasks assessed/performed Overall Cognitive Status: Within Functional Limits for tasks assessed                                         General Comments      Exercises     Assessment/Plan    PT Assessment Patient needs continued PT services  PT Problem List Decreased strength;Decreased activity tolerance;Decreased balance;Decreased mobility;Decreased knowledge of use of DME;Decreased safety awareness       PT Treatment Interventions DME instruction;Gait training;Stair training;Functional mobility training;Therapeutic activities;Therapeutic exercise;Balance training;Neuromuscular re-education;Patient/family education    PT Goals (Current goals can be found in the Care Plan section)  Acute Rehab PT Goals Patient Stated Goal: Return home PT Goal Formulation: With patient Time For Goal Achievement: 03/20/17 Potential to Achieve Goals: Good    Frequency Min 3X/week   Barriers to discharge        Co-evaluation               AM-PAC PT "6 Clicks" Daily Activity  Outcome Measure Difficulty turning over in bed (including adjusting bedclothes, sheets and blankets)?: A Little Difficulty moving from lying on back to sitting on the side of the bed? : Unable Difficulty sitting down on and standing up from a chair with arms (e.g., wheelchair, bedside commode, etc,.)?: Unable Help needed moving to and from a bed to chair (including a wheelchair)?: A Little Help needed walking in hospital room?: A Little Help needed climbing 3-5 steps with a railing? : A Little 6 Click Score: 14    End of Session Equipment Utilized During Treatment: Gait belt Activity Tolerance: Patient tolerated treatment well Patient left: in chair;with call bell/phone within reach;with chair alarm set Nurse Communication: Mobility status PT Visit Diagnosis: Other abnormalities of gait and mobility (R26.89);Muscle weakness (generalized) (M62.81);Difficulty in walking, not elsewhere classified (R26.2)    Time: 2924-4628 PT Time Calculation (min) (ACUTE ONLY): 27 min   Charges:   PT Evaluation $PT Eval Moderate Complexity: 1 Mod      Lanney Gins, PT, DPT 03/06/17 1:04 PM

## 2017-03-06 NOTE — Progress Notes (Addendum)
[place] for agratroban Indication: atrial fibrillation  Allergies  Allergen Reactions  . Heparin Hives    Patient attests severe allergy- rash all over, severe itching, unable to take heparin  . Other Itching and Rash    "Sheets and Linens used by Zacarias Pontes"    Patient Measurements: Height: 5\' 6"  (167.6 cm) Weight: 282 lb 6.6 oz (128.1 kg) IBW/kg (Calculated) : 59.3  Vital Signs: Temp: 97.9 F (36.6 C) (12/09 2106) Temp Source: Oral (12/09 2106) BP: 124/77 (12/09 2106) Pulse Rate: 81 (12/09 2106)  Labs: Recent Labs    03/04/17 1004 03/05/17 0306 03/05/17 1546 03/05/17 2048 03/05/17 2309  HGB 13.9 12.1  --   --   --   HCT 41.8 37.4  --   --   --   PLT 171 164  --   --   --   APTT  --   --   --  81* 111*  LABPROT 33.0* 28.3* 18.7*  --   --   INR 3.27 2.68 1.57  --   --   CREATININE 1.16* 1.10*  --   --   --     Estimated Creatinine Clearance: 64.3 mL/min (A) (by C-G formula based on SCr of 1.1 mg/dL (H)).   Medical History: Past Medical History:  Diagnosis Date  . (HFpEF) heart failure with preserved ejection fraction ([place])    a. 06/2008 Echo: EF 55-60%, mild LVH, triv AI, mild to mod MS, mod to sev dil LA, mildly dil RA.  Marland Kitchen Acute right MCA stroke ([place])    a. 05/31/4654 Embolic stroke treated with TPA and thrombectomy with hemorrhagic transformation; Carotids 3/12 negative for ICA stenosis.  . Bilateral Pulmonary Emboli    a. 08/2005 - s/p IVC filter.  . Depression   . Embolus of femoral artery ([place])    a. 06/2008 s/p bilateral embolectomies.  . History of DVT (deep vein thrombosis)    a. s/p IVC filter.  Marland Kitchen HIT (heparin-induced thrombocytopenia) ([place])   . HTN (hypertension)   . Lupus anticoagulant disorder ([place])   . Obesity, unspecified   . Permanent atrial fibrillation ([place])    a. On chronic Coumadin - INRs followed by PCP; b. CHA2DS2VASc = 5.  . Popliteal artery embolism, right ([place])    a. 01/2017 in the setting of  subRx INR-->s/p embolectomy.    Medications:  Scheduled:  . aspirin EC  81 mg Oral Daily  . cholecalciferol  1,000 Units Oral Daily  . diltiazem  300 mg Oral Daily  . metoprolol tartrate  12.5 mg Oral BID    Assessment: 19 yof is admitted on atrial fibrillation on warfarin for prior embolic events. She was found to have increased swelling and erythema right below knee incision and underwent I&D of right below-knee popliteal incision. PTA regimen shows 6 mg daily except 4.5 mg daily with INR goal of 2.5-3.5 noted on anticoagulation clinic visit summary on 12/7.  INR was 3.27 on admission and received 6 mg warfarin dose. INR next morning was 2.68. Received 10 mg vitamin K for I&D and repeat INR came back at 1.57 this afternoon. Patient attests to severe allergy with heparin (of note, not related to heparin antibody which was negative upon last collection) so pharmacy consulted to dose argatroban instead starting at 1900 following I&D. LFTs are within normal limits. Hgb was 12.1 and platelets are stable.   12/10 AM: confirmatory aPTT is elevated at 111, no issues per RN.  Goal of Therapy:  aPTT 50-90 seconds Monitor platelets by anticoagulation protocol: Yes   Plan:  Dec argatroban to 0.6 mcg/kg/min 0500 aPTT  Narda Bonds, PharmD, BCPS Clinical Pharmacist Phone: [phone]  ======================= Addendum 4:47 AM Repeat aPTT in 2 hours this AM is still elevated at 108 -Dec argatroban to 0.3 mcg/kg/min -Check aPTT at 0800  Narda Bonds, PharmD, [place] Pharmacist Phone: [phone] ========================

## 2017-03-06 NOTE — Progress Notes (Signed)
Called physician and pharmacy about INR 5.82. Informed argatroban can falsely increase INR, which is why following PTT. Will continue to monitor.  Lupita Dawn, RN

## 2017-03-07 ENCOUNTER — Encounter (HOSPITAL_COMMUNITY): Payer: Self-pay | Admitting: Vascular Surgery

## 2017-03-07 LAB — BASIC METABOLIC PANEL
Anion gap: 7 (ref 5–15)
BUN: 21 mg/dL — AB (ref 6–20)
CHLORIDE: 104 mmol/L (ref 101–111)
CO2: 26 mmol/L (ref 22–32)
CREATININE: 0.99 mg/dL (ref 0.44–1.00)
Calcium: 8.5 mg/dL — ABNORMAL LOW (ref 8.9–10.3)
GFR calc non Af Amer: 56 mL/min — ABNORMAL LOW (ref >60)
Glucose, Bld: 149 mg/dL — ABNORMAL HIGH (ref 65–99)
Potassium: 3.6 mmol/L (ref 3.5–5.1)
Sodium: 137 mmol/L (ref 135–145)

## 2017-03-07 LAB — CBC
HEMATOCRIT: 36 % (ref 36.0–46.0)
Hemoglobin: 11.7 g/dL — ABNORMAL LOW (ref 12.0–15.0)
MCH: 32.5 pg (ref 26.0–34.0)
MCHC: 32.5 g/dL (ref 30.0–36.0)
MCV: 100 fL (ref 78.0–100.0)
Platelets: 168 10*3/uL (ref 150–400)
RBC: 3.6 MIL/uL — ABNORMAL LOW (ref 3.87–5.11)
RDW: 13.3 % (ref 11.5–15.5)
WBC: 15.9 10*3/uL — ABNORMAL HIGH (ref 4.0–10.5)

## 2017-03-07 LAB — PROTIME-INR
INR: 2.37
PROTHROMBIN TIME: 25.7 s — AB (ref 11.4–15.2)

## 2017-03-07 LAB — APTT: aPTT: 64 seconds — ABNORMAL HIGH (ref 24–36)

## 2017-03-07 MED ORDER — FUROSEMIDE 40 MG PO TABS
40.0000 mg | ORAL_TABLET | Freq: Every day | ORAL | Status: DC
  Administered 2017-03-07 – 2017-03-12 (×6): 40 mg via ORAL
  Filled 2017-03-07 (×6): qty 1

## 2017-03-07 MED ORDER — WARFARIN - PHARMACIST DOSING INPATIENT
Freq: Every day | Status: DC
  Administered 2017-03-07: 18:00:00

## 2017-03-07 MED ORDER — WARFARIN SODIUM 3 MG PO TABS
6.0000 mg | ORAL_TABLET | Freq: Once | ORAL | Status: AC
  Administered 2017-03-07: 6 mg via ORAL
  Filled 2017-03-07: qty 2

## 2017-03-07 NOTE — Progress Notes (Signed)
Patient ambulated 335ft in the hall using a walker, ambulation well tolerated, will continue to monitor.

## 2017-03-07 NOTE — Progress Notes (Signed)
PROGRESS NOTE    [name]  NFA:[id number] DOB: [birth year] DOA: 03/04/2017 PCP: Josetta Huddle, MD    Brief Narrative:  [name] is a 71 y.o. female with medical history significant A. fib on Coumadin and prior embolic events, stroke, hypertension, recent popliteal artery embolism causing ischemia right leg in the setting of subtherapeutic INR and lupus anticoagulant, status post embolectomy 2 weeks ago presents to the emergency department with the chief complaint of 2 day history of right leg swelling. Initial evaluation reveals tachycardia leukocytosis elevated lactic acid. [facility] are asked to admit.  Patient admitted with sepsis, and post operative wound infection. Vascular was consulted. Patient underwent  incision and drainage of abscess right below knee popliteal incision on 12-09. She is on IV antibiotics. Plan is to place patient on wound Vac.    Assessment & Plan:   Principal Problem:   Sepsis ([place]) Active Problems:   Obesity, unspecified   ATRIAL FIBRILLATION   Popliteal artery embolus ([facility])   Cellulitis   1-Sepsis; Presents with fever, leukocytosis, lactic acidosis. Tachycardia.  Likely related to infectious process at embolectomy site on right LE Lactic acid peak to 2.8, it has decreased to 1.4. WBC trending down from 22 to 12. --15 Continue with IV antibiotics. Vancomycin and Zosyn.  Underwent incision and drainage of abscess right below knee popliteal incision. Marland Kitchen   2-Right lower extremity Cellulitis; redness at incision site,  Continue with IV vancomycin and Zosyn.  Doppler; Area of mix echoes in the proximal mid calf, near the surgical site.  Dr Eden Lathe consulted.  Underwent incision and drainage of abscess right below knee popliteal incision on 12-09. Continue with IV antibiotics , follow culture,.modearte staph aureus, sensitivity pending.   Hyponatremia, mild; resolved with IV fluids.   Hyperbilirubinemia; repeat labs in am.   AKI; in  setting of sepsis. Last cr per records at 0.8.  Continue with IV fluids.  UA with positives nitrates. Already on IV antibiotics.  Resolved. Will resume lasix.   A fib; on coumadin at home , metoprolol and Cardizem.  Started on argatroban post sx. Heparin allergy. Resume coumadin per vascular recommendation.   Chronic Diastolic HF;  Resume lasix. Monitor renal function.  Compensated.   Ploplitial artery embolus S/P embolectomy.  Performed by Dr Eden Lathe.     DVT prophylaxis:coumadin.  Code Status: full code Family Communication: care discussed with patient.  Disposition Plan: Remain inpatient.   Consultants:   Vascular.    Procedures:  none  Antimicrobials:  Vancomycin 12-09 Zosyn 12-09  Subjective: She denies leg pain. Denies dyspnea or cough.    Objective: Vitals:   03/07/17 0600 03/07/17 0700 03/07/17 0710 03/07/17 0844  BP:   (!) 137/59   Pulse:    63  Resp: 13 14 (!) 24   Temp:   97.8 F (36.6 C)   TempSrc:   Oral   SpO2:   97%   Weight:  129.5 kg (285 lb 7.9 oz)    Height:        Intake/Output Summary (Last 24 hours) at 03/07/2017 1232 Last data filed at 03/07/2017 1000 Gross per 24 hour  Intake 3058.38 ml  Output 975 ml  Net 2083.38 ml   Filed Weights   03/05/17 0429 03/06/17 0409 03/07/17 0700  Weight: 128.1 kg (282 lb 6.6 oz) 128.9 kg (284 lb 2.8 oz) 129.5 kg (285 lb 7.9 oz)    Examination:  General exam: NAD Respiratory system: Normal respiratory effort, CTA Cardiovascular system: S 1,  S 2 RRR Gastrointestinal system: BS present, soft, nt Central nervous system: non focal.  Extremities: Symmetric power.  Skin: Right lower extremity with clean dressing.   Data Reviewed: I have personally reviewed following labs and imaging studies  CBC: Recent Labs  Lab 03/04/17 1004 03/05/17 0306 03/06/17 0311 03/07/17 0231  WBC 22.8* 18.3* 12.9* 15.9*  NEUTROABS 20.2*  --   --   --   HGB 13.9 12.1 11.6* 11.7*  HCT 41.8 37.4 35.5* 36.0  MCV  99.5 101.4* 100.0 100.0  PLT 171 164 162 263   Basic Metabolic Panel: Recent Labs  Lab 03/04/17 1004 03/05/17 0306 03/06/17 0311 03/07/17 1007  NA 132* 136 136 137  K 3.9 3.9 3.8 3.6  CL 97* 101 102 104  CO2 26 27 27 26   GLUCOSE 205* 127* 227* 149*  BUN 12 11 14  21*  CREATININE 1.16* 1.10* 1.02* 0.99  CALCIUM 8.4* 8.0* 8.3* 8.5*   GFR: Estimated Creatinine Clearance: 71.9 mL/min (by C-G formula based on SCr of 0.99 mg/dL). Liver Function Tests: Recent Labs  Lab 03/04/17 1004  AST 18  ALT 14  ALKPHOS 95  BILITOT 2.1*  PROT 7.3  ALBUMIN 2.9*   No results for input(s): LIPASE, AMYLASE in the last 168 hours. No results for input(s): AMMONIA in the last 168 hours. Coagulation Profile: Recent Labs  Lab 03/04/17 1004 03/05/17 0306 03/05/17 1546 03/06/17 0311 03/07/17 0231  INR 3.27 2.68 1.57 5.82* 2.37   Cardiac Enzymes: No results for input(s): CKTOTAL, CKMB, CKMBINDEX, TROPONINI in the last 168 hours. BNP (last 3 results) No results for input(s): PROBNP in the last 8760 hours. HbA1C: No results for input(s): HGBA1C in the last 72 hours. CBG: No results for input(s): GLUCAP in the last 168 hours. Lipid Profile: No results for input(s): CHOL, HDL, LDLCALC, TRIG, CHOLHDL, LDLDIRECT in the last 72 hours. Thyroid Function Tests: No results for input(s): TSH, T4TOTAL, FREET4, T3FREE, THYROIDAB in the last 72 hours. Anemia Panel: No results for input(s): VITAMINB12, FOLATE, FERRITIN, TIBC, IRON, RETICCTPCT in the last 72 hours. Sepsis Labs: Recent Labs  Lab 03/04/17 1018 03/04/17 1238 03/04/17 1415 03/04/17 1644  LATICACIDVEN 1.85 2.87* 1.4 1.4    Recent Results (from the past 240 hour(s))  Culture, blood (Routine x 2)     Status: None (Preliminary result)   Collection Time: 03/04/17 10:10 AM  Result Value Ref Range Status   Specimen Description BLOOD RIGHT ANTECUBITAL  Final   Special Requests   Final    BOTTLES DRAWN AEROBIC AND ANAEROBIC Blood Culture  adequate volume   Culture NO GROWTH 2 DAYS  Final   Report Status PENDING  Incomplete  Culture, blood (Routine x 2)     Status: None (Preliminary result)   Collection Time: 03/04/17 12:20 PM  Result Value Ref Range Status   Specimen Description BLOOD RIGHT HAND  Final   Special Requests   Final    BOTTLES DRAWN AEROBIC ONLY Blood Culture adequate volume   Culture NO GROWTH 2 DAYS  Final   Report Status PENDING  Incomplete  Aerobic/Anaerobic Culture (surgical/deep wound)     Status: None (Preliminary result)   Collection Time: 03/05/17 12:44 PM  Result Value Ref Range Status   Specimen Description LEG RIGHT  Final   Special Requests NONE  Final   Gram Stain   Final    MODERATE WBC PRESENT, PREDOMINANTLY PMN RARE GRAM POSITIVE COCCI IN PAIRS    Culture   Final    MODERATE  STAPHYLOCOCCUS AUREUS SUSCEPTIBILITIES TO FOLLOW NO ANAEROBES ISOLATED; CULTURE IN PROGRESS FOR 5 DAYS    Report Status PENDING  Incomplete         Radiology Studies: No results found.      Scheduled Meds: . aspirin EC  81 mg Oral Daily  . cholecalciferol  1,000 Units Oral Daily  . diltiazem  300 mg Oral Daily  . metoprolol tartrate  12.5 mg Oral BID   Continuous Infusions: . sodium chloride 75 mL/hr at 03/06/17 1349  . argatroban 0.3 mcg/kg/min (03/06/17 2023)  . piperacillin-tazobactam (ZOSYN)  IV 3.375 g (03/07/17 1226)  . vancomycin Stopped (03/07/17 0330)     LOS: 2 days    Time spent: 35 minutes.     Elmarie Shiley, MD [facility] Pager [phone]  If 7PM-7AM, please contact night-coverage [email or web address] Password [facility] 03/07/2017, 12:32 PM

## 2017-03-07 NOTE — Progress Notes (Addendum)
[place] for agratroban Indication: atrial fibrillation  Allergies  Allergen Reactions  . Heparin Hives    Patient attests severe allergy- rash all over, severe itching, unable to take heparin  . Other Itching and Rash    "Sheets and Linens used by Zacarias Pontes"    Patient Measurements: Height: 5\' 6"  (167.6 cm) Weight: 285 lb 7.9 oz (129.5 kg) IBW/kg (Calculated) : 59.3  Vital Signs: Temp: 97.8 F (36.6 C) (12/11 0710) Temp Source: Oral (12/11 0710) BP: 137/59 (12/11 0710) Pulse Rate: 63 (12/11 0844)  Labs: Recent Labs    03/04/17 1004 03/05/17 0306 03/05/17 1546  03/06/17 0311 03/06/17 0834 03/06/17 1258 03/07/17 0231  HGB 13.9 12.1  --   --  11.6*  --   --  11.7*  HCT 41.8 37.4  --   --  35.5*  --   --  36.0  PLT 171 164  --   --  162  --   --  168  APTT  --   --   --    < > 108* 89* 66* 64*  LABPROT 33.0* 28.3* 18.7*  --  51.9*  --   --  25.7*  INR 3.27 2.68 1.57  --  5.82*  --   --  2.37  CREATININE 1.16* 1.10*  --   --  1.02*  --   --   --    < > = values in this interval not displayed.    Estimated Creatinine Clearance: 69.8 mL/min (A) (by C-G formula based on SCr of 1.02 mg/dL (H)).   Medical History: Past Medical History:  Diagnosis Date  . (HFpEF) heart failure with preserved ejection fraction ([place])    a. 06/2008 Echo: EF 55-60%, mild LVH, triv AI, mild to mod MS, mod to sev dil LA, mildly dil RA.  Marland Kitchen Acute right MCA stroke ([place])    a. 10/29/6627 Embolic stroke treated with TPA and thrombectomy with hemorrhagic transformation; Carotids 3/12 negative for ICA stenosis.  . Bilateral Pulmonary Emboli    a. 08/2005 - s/p IVC filter.  . Depression   . Embolus of femoral artery ([place])    a. 06/2008 s/p bilateral embolectomies.  . History of DVT (deep vein thrombosis)    a. s/p IVC filter.  Marland Kitchen HIT (heparin-induced thrombocytopenia) ([place])   . HTN (hypertension)   . Lupus anticoagulant disorder ([place])   . Obesity, unspecified    . Permanent atrial fibrillation ([place])    a. On chronic Coumadin - INRs followed by PCP; b. CHA2DS2VASc = 5.  . Popliteal artery embolism, right ([place])    a. 01/2017 in the setting of subRx INR-->s/p embolectomy.    Medications:  Scheduled:  . aspirin EC  81 mg Oral Daily  . cholecalciferol  1,000 Units Oral Daily  . diltiazem  300 mg Oral Daily  . metoprolol tartrate  12.5 mg Oral BID    Assessment: 18 yof is admitted on atrial fibrillation on warfarin for prior embolic events. She was found to have increased swelling and erythema right below knee incision and underwent I&D of right below-knee popliteal incision on 12/9.  She is noted s/p vitamin K 10mg  IV 12/9 and is now on argatroban due to a heparin allergy.  -aPTT= 64 on 0.3 mcg/kg/min  Goal of Therapy:  aPTT 50-90 seconds Monitor platelets by anticoagulation protocol: Yes   Plan:  -No argatroban dose changes needed -Daily aPTT -Will follow anticoagulation plans  Hildred Laser, Pharm  D 03/07/2017 8:50 AM  Addendum -restarting coumadin -INR= 2.37 -goal INR on coumadin/argatroban is 4-6 -last coumadin regimen was: to 6 mg daily except 4.5 mg Mon   Plan -Coumadin 6mg  po today -Daily PT/INR  Hildred Laser, Pharm D 03/07/2017 2:59 PM

## 2017-03-07 NOTE — Progress Notes (Addendum)
  Progress Note    03/07/2017 8:18 AM 2 Days Post-Op  Subjective:  No complaints  Afebrile HR  70's-60's Afib 741'O-878'M systolic 76% RA  Vitals:   03/07/17 0700 03/07/17 0710  BP:  (!) 137/59  Pulse:    Resp: 14 (!) 24  Temp:  97.8 F (36.6 C)  SpO2:  97%    Physical Exam: General:  NAD Lungs:  Non labored Incisions:  Clean and dry     CBC    Component Value Date/Time   WBC 15.9 (H) 03/07/2017 0231   RBC 3.60 (L) 03/07/2017 0231   HGB 11.7 (L) 03/07/2017 0231   HCT 36.0 03/07/2017 0231   PLT 168 03/07/2017 0231   MCV 100.0 03/07/2017 0231   MCH 32.5 03/07/2017 0231   MCHC 32.5 03/07/2017 0231   RDW 13.3 03/07/2017 0231   LYMPHSABS 1.1 03/04/2017 1004   MONOABS 1.5 (H) 03/04/2017 1004   EOSABS 0.1 03/04/2017 1004   BASOSABS 0.0 03/04/2017 1004    BMET    Component Value Date/Time   NA 136 03/06/2017 0311   K 3.8 03/06/2017 0311   CL 102 03/06/2017 0311   CO2 27 03/06/2017 0311   GLUCOSE 227 (H) 03/06/2017 0311   BUN 14 03/06/2017 0311   CREATININE 1.02 (H) 03/06/2017 0311   CALCIUM 8.3 (L) 03/06/2017 0311   GFRNONAA 54 (L) 03/06/2017 0311   GFRAA >60 03/06/2017 0311    INR    Component Value Date/Time   INR 2.37 03/07/2017 0231   INR 2.2 05/07/2010 1039     Intake/Output Summary (Last 24 hours) at 03/07/2017 0818 Last data filed at 03/07/2017 7209 Gross per 24 hour  Intake 3178.38 ml  Output 625 ml  Net 2553.38 ml     Assessment:  71 y.o. female is s/p:  Incision and drainage right below-knee popliteal incision   2 Days Post-Op  Plan: -pt doing well this am -dressing changed and wound bed is clean without purulence.  Possibly may be able to put vac on today or tomorrow. -continue IV abx  -DVT prophylaxis:  Argatroban-may be able to start coumadin back today-will d/w Dr. Cecile Hearing, PA-C [facility] [phone] 03/07/2017 8:18 AM   Place VAC today Will need VAC at home [facility] for d/c  from our standpoint when home VAC approved Ok to restart oral anticoagulation  Ruta Hinds, MD [facility] [place]: [phone] Pager: [phone]

## 2017-03-07 NOTE — Progress Notes (Addendum)
Physical Therapy Treatment Patient Details Name: [name] MRN: [id number] DOB: 1945-04-23 Today's Date: 03/07/2017    History of Present Illness Patient is a 71 y/o female admitted on 03/04/17 due to 2 day history of R LE swelling s/p embelectomy 2 weeks prior. Dx with large abscess in R below-knee popliteal space s/p I and D on 03/05/17. Patient with a PMHx significant for A. fib on Coumadin and prior embolic events, stroke, hypertension, recent popliteal artery embolism causing ischemia right leg in the setting of subtherapeutic INR and lupus anticoagulant.    PT Comments    Patient motivated to work with PT this AM. Much improved sit to stand transfer from bed today with only Min Guard for safety - does require VC to push up from seated surface rather than pulling from RW to reduce fall risk. Able to ambulate a greater distance as well as increase gait velocity today with patient feeling well with how she is currently ambulating. Will continue to follow acutely to maximize functional mobility prior to d/c.    Follow Up Recommendations  Supervision for mobility/OOB     Equipment Recommendations  None recommended by PT    Recommendations for Other Services       Precautions / Restrictions Precautions Precautions: Fall Restrictions Weight Bearing Restrictions: No    Mobility  Bed Mobility Overal bed mobility: Needs Assistance Bed Mobility: Supine to Sit     Supine to sit: Min assist     General bed mobility comments: Min A for patient to pull on PT to EOB  Transfers Overall transfer level: Needs assistance Equipment used: Rolling walker (2 wheeled) Transfers: Sit to/from Stand Sit to Stand: Min guard         General transfer comment: improved sit to stand from bed today with only Min Guard from PT - does require VC to not pull on RW  Ambulation/Gait Ambulation/Gait assistance: Min guard Ambulation Distance (Feet): 300 Feet Assistive device: Rolling walker  (2 wheeled);None Gait Pattern/deviations: Step-through pattern;Decreased stride length;Antalgic     General Gait Details: increased gait speed today; VC for turning and remaining close to [employer] Mobility    Modified Rankin (Stroke Patients Only)       Balance Overall balance assessment: Needs assistance Sitting-balance support: Feet supported Sitting balance-Leahy Scale: Good     Standing balance support: Bilateral upper extremity supported;During functional activity Standing balance-Leahy Scale: Fair                              Cognition Arousal/Alertness: Awake/alert Behavior During Therapy: WFL for tasks assessed/performed Overall Cognitive Status: Within Functional Limits for tasks assessed                                        Exercises      General Comments        Pertinent Vitals/Pain Pain Assessment: No/denies pain Pain Score: 0-No pain Pain Location: some "soreness" of R LE that is "working out" while walking    Home Living                      Prior Function            PT Goals (current goals can now be found in  the care plan section) Acute Rehab PT Goals Patient Stated Goal: Return home PT Goal Formulation: With patient Time For Goal Achievement: 03/20/17 Potential to Achieve Goals: Good Progress towards PT goals: Progressing toward goals    Frequency    Min 3X/week      PT Plan Current plan remains appropriate    Co-evaluation              AM-PAC PT "6 Clicks" Daily Activity  Outcome Measure  Difficulty turning over in bed (including adjusting bedclothes, sheets and blankets)?: Unable Difficulty moving from lying on back to sitting on the side of the bed? : unable Difficulty sitting down on and standing up from a chair with arms (e.g., wheelchair, bedside commode, etc,.)?: Unable Help needed moving to and from a bed to chair (including a wheelchair)?: A  Little Help needed walking in hospital room?: A Little Help needed climbing 3-5 steps with a railing? : A Little 6 Click Score: 12    End of Session Equipment Utilized During Treatment: Gait belt Activity Tolerance: Patient tolerated treatment well Patient left: in chair;with call bell/phone within reach;with chair alarm set Nurse Communication: Mobility status PT Visit Diagnosis: Other abnormalities of gait and mobility (R26.89);Muscle weakness (generalized) (M62.81);Difficulty in walking, not elsewhere classified (R26.2)     Time: 6962-9528 PT Time Calculation (min) (ACUTE ONLY): 16 min  Charges:  $Gait Training: 8-22 mins                    Lanney Gins, PT, DPT 03/07/17 10:22 AM

## 2017-03-07 NOTE — Anesthesia Postprocedure Evaluation (Signed)
Anesthesia Post Note  Patient: [name]  Procedure(s) Performed: IRRIGATION AND DEBRIDEMENT RIGHT LEG HEMATOMA EVACUATION (Right Leg Lower)     Patient location during evaluation: PACU Anesthesia Type: General Level of consciousness: awake and alert Pain management: pain level controlled Vital Signs Assessment: post-procedure vital signs reviewed and stable Respiratory status: spontaneous breathing, nonlabored ventilation, respiratory function stable and patient connected to nasal cannula oxygen Cardiovascular status: blood pressure returned to baseline and stable Postop Assessment: no apparent nausea or vomiting Anesthetic complications: no    Last Vitals:  Vitals:   03/06/17 2018 03/06/17 2020  BP: (!) 124/52 (!) 124/52  Pulse: 69 68  Resp:  (!) 23  Temp:    SpO2:  94%    Last Pain:  Vitals:   03/06/17 1945  TempSrc:   PainSc: 0-No pain                 Kattaleya Alia

## 2017-03-08 DIAGNOSIS — I4891 Unspecified atrial fibrillation: Secondary | ICD-10-CM

## 2017-03-08 DIAGNOSIS — I743 Embolism and thrombosis of arteries of the lower extremities: Secondary | ICD-10-CM

## 2017-03-08 DIAGNOSIS — A419 Sepsis, unspecified organism: Secondary | ICD-10-CM

## 2017-03-08 DIAGNOSIS — L03115 Cellulitis of right lower limb: Secondary | ICD-10-CM

## 2017-03-08 LAB — BASIC METABOLIC PANEL
ANION GAP: 6 (ref 5–15)
BUN: 20 mg/dL (ref 6–20)
CALCIUM: 8.2 mg/dL — AB (ref 8.9–10.3)
CO2: 28 mmol/L (ref 22–32)
Chloride: 104 mmol/L (ref 101–111)
Creatinine, Ser: 1.1 mg/dL — ABNORMAL HIGH (ref 0.44–1.00)
GFR calc Af Amer: 57 mL/min — ABNORMAL LOW (ref >60)
GFR, EST NON AFRICAN AMERICAN: 49 mL/min — AB (ref >60)
Glucose, Bld: 147 mg/dL — ABNORMAL HIGH (ref 65–99)
POTASSIUM: 3.7 mmol/L (ref 3.5–5.1)
SODIUM: 138 mmol/L (ref 135–145)

## 2017-03-08 LAB — PROTIME-INR
INR: 2.24
PROTHROMBIN TIME: 24.6 s — AB (ref 11.4–15.2)

## 2017-03-08 LAB — HEPATIC FUNCTION PANEL
ALT: 18 U/L (ref 14–54)
AST: 17 U/L (ref 15–41)
Albumin: 2.4 g/dL — ABNORMAL LOW (ref 3.5–5.0)
Alkaline Phosphatase: 81 U/L (ref 38–126)
BILIRUBIN DIRECT: 0.3 mg/dL (ref 0.1–0.5)
BILIRUBIN INDIRECT: 0.5 mg/dL (ref 0.3–0.9)
TOTAL PROTEIN: 6.4 g/dL — AB (ref 6.5–8.1)
Total Bilirubin: 0.8 mg/dL (ref 0.3–1.2)

## 2017-03-08 LAB — CBC
HCT: 35.6 % — ABNORMAL LOW (ref 36.0–46.0)
Hemoglobin: 11.4 g/dL — ABNORMAL LOW (ref 12.0–15.0)
MCH: 31.9 pg (ref 26.0–34.0)
MCHC: 32 g/dL (ref 30.0–36.0)
MCV: 99.7 fL (ref 78.0–100.0)
Platelets: 182 10*3/uL (ref 150–400)
RBC: 3.57 MIL/uL — ABNORMAL LOW (ref 3.87–5.11)
RDW: 13.3 % (ref 11.5–15.5)
WBC: 11.5 10*3/uL — ABNORMAL HIGH (ref 4.0–10.5)

## 2017-03-08 LAB — APTT: APTT: 50 s — AB (ref 24–36)

## 2017-03-08 MED ORDER — WARFARIN SODIUM 3 MG PO TABS
6.0000 mg | ORAL_TABLET | Freq: Once | ORAL | Status: AC
  Administered 2017-03-08: 6 mg via ORAL
  Filled 2017-03-08: qty 2

## 2017-03-08 MED ORDER — OXYCODONE HCL 5 MG PO TABS: 5.0000 mg | ORAL_TABLET | Freq: Four times a day (QID) | ORAL | Status: DC | PRN

## 2017-03-08 NOTE — Progress Notes (Signed)
[facility] for agratroban/coumadin Indication: atrial fibrillation  Allergies  Allergen Reactions  . Heparin Hives    Patient attests severe allergy- rash all over, severe itching, unable to take heparin  . Other Itching and Rash    "Sheets and Linens used by Zacarias Pontes"    Patient Measurements: Height: 5\' 6"  (167.6 cm) Weight: 285 lb 0.9 oz (129.3 kg) IBW/kg (Calculated) : 59.3  Vital Signs: Temp: 97.8 F (36.6 C) (12/12 0409) Temp Source: Oral (12/12 0409) BP: 99/84 (12/12 0409) Pulse Rate: 45 (12/12 0409)  Labs: Recent Labs    03/06/17 0311  03/06/17 1258 03/07/17 0231 03/07/17 1007 03/08/17 0308  HGB 11.6*  --   --  11.7*  --  11.4*  HCT 35.5*  --   --  36.0  --  35.6*  PLT 162  --   --  168  --  182  APTT 108*   < > 66* 64*  --  50*  LABPROT 51.9*  --   --  25.7*  --  24.6*  INR 5.82*  --   --  2.37  --  2.24  CREATININE 1.02*  --   --   --  0.99 1.10*   < > = values in this interval not displayed.    Estimated Creatinine Clearance: 64.6 mL/min (A) (by C-G formula based on SCr of 1.1 mg/dL (H)).   Medical History: Past Medical History:  Diagnosis Date  . (HFpEF) heart failure with preserved ejection fraction ([place])    a. 06/2008 Echo: EF 55-60%, mild LVH, triv AI, mild to mod MS, mod to sev dil LA, mildly dil RA.  Marland Kitchen Acute right MCA stroke ([place])    a. 12/04/2424 Embolic stroke treated with TPA and thrombectomy with hemorrhagic transformation; Carotids 3/12 negative for ICA stenosis.  . Bilateral Pulmonary Emboli    a. 08/2005 - s/p IVC filter.  . Depression   . Embolus of femoral artery ([place])    a. 06/2008 s/p bilateral embolectomies.  . History of DVT (deep vein thrombosis)    a. s/p IVC filter.  Marland Kitchen HIT (heparin-induced thrombocytopenia) ([place])   . HTN (hypertension)   . Lupus anticoagulant disorder ([place])   . Obesity, unspecified   . Permanent atrial fibrillation ([place])    a. On chronic Coumadin - INRs followed by PCP;  b. CHA2DS2VASc = 5.  . Popliteal artery embolism, right ([place])    a. 01/2017 in the setting of subRx INR-->s/p embolectomy.    Medications:  Scheduled:  . aspirin EC  81 mg Oral Daily  . cholecalciferol  1,000 Units Oral Daily  . diltiazem  300 mg Oral Daily  . furosemide  40 mg Oral Daily  . metoprolol tartrate  12.5 mg Oral BID  . warfarin  6 mg Oral ONCE-1800  . Warfarin - Pharmacist Dosing Inpatient   Does not apply q1800    Assessment: 61 yof is admitted on atrial fibrillation on warfarin for prior embolic events. She was found to have increased swelling and erythema right below knee incision and underwent I&D of right below-knee popliteal incision on 12/9.  She is noted s/p vitamin K 10mg  IV 12/9 and is now on argatroban due to a heparin allergy.  -aPTT= 50on 0.3 mcg/kg/min  Home coumadin regimen: to 6 mg daily except 4.5 mg Mon   Goal of Therapy:  INR goal 4-6 while on argatroban aPTT 50-90 seconds Monitor platelets by anticoagulation protocol: Yes   Plan:  -  Increase argatroban to 0.24mcg/kg to keep in goal -Coumadin 6mg  po today  -Daily aPTT, INR -Will follow anticoagulation plans  Hildred Laser, Pharm D 03/08/2017 7:55 AM

## 2017-03-08 NOTE — Care Management Note (Signed)
Case Management Note  Patient Details  Name: [name] MRN: [id number] Date of Birth: [birth year]  Subjective/Objective:            Pt status post  I&D of right below-knee popliteal incision on 12/9.  Action/Plan: Referral received for wound vac needs. In to speak with Pt, offered choice for [facility].  Discussed recommendations made for Wound Vac and HH/RN.  Pt selected [facility].  Pt plans to go to daughter "[name]" home post discharge at: [address] or [address], [place],  [address].  Phone: [phone].  Pt states she thinks she has the following DME equipment at home will check for: walker, bedside commode.  NCM will follow for this and arrange.    PCP noted. Home wound vac form placed on shadow chart. Will follow up once signed. Referral called to Butch Penny at [facility] regarding Wound vac needs and home health referral.  Pt will need [facility] orders prior to discharge.  Expected Discharge Date:                  Expected Discharge Plan:  [place]  In-House Referral:  NA  Discharge planning Services  CM Consult  Post Acute Care Choice:  Home Health, Durable Medical Equipment Choice offered to:  Patient  DME Arranged:  Vac DME Agency:  [facility] Arranged:  RN [facility] Agency:  [facility]  Status of Service:  In process, will continue to follow  If discussed at Long Length of Stay Meetings, dates discussed:    Additional Comments:  Kristen Cardinal, RN 03/08/2017, 10:37 AM

## 2017-03-08 NOTE — Progress Notes (Addendum)
Patient awake H.R. in the 50's At Fib. Held tonight's dose of Loressor. R.N. Aware.

## 2017-03-08 NOTE — Progress Notes (Signed)
[place] for agratroban/coumadin Indication: atrial fibrillation  I spoke with patient regarding the possible use of lovenox.  She is currenly on argatroban and coumadin and goal INR will need to be between 4-6 to achieve goal. I anticipate she will need another 2-3 days before she reaches goal -INR= 2.24  I explained lovenox to her as well as the administration process.  She would like some time to consider the idea.  If she agrees to this will further discuss the possibility with Dr. Oneida Alar.   Plan -If she agrees to lovenox, will discuss with vascular team -consider observing for 24 hours as she does have a heparin allergy (rash)  Hildred Laser, Pharm D 03/08/2017 1:01 PM

## 2017-03-08 NOTE — Progress Notes (Signed)
Patient sleeping H.R. in the mid 40's to 41's

## 2017-03-08 NOTE — Progress Notes (Signed)
Physical Therapy Treatment Patient Details Name: [name] MRN: [id number] DOB: May 10, 1945 Today's Date: 03/08/2017    History of Present Illness Patient is a 71 y/o female admitted on 03/04/17 due to 2 day history of R LE swelling s/p embelectomy 2 weeks prior. Dx with large abscess in R below-knee popliteal space s/p I and D on 03/05/17. Patient with a PMHx significant for A. fib on Coumadin and prior embolic events, stroke, hypertension, recent popliteal artery embolism causing ischemia right leg in the setting of subtherapeutic INR and lupus anticoagulant.    PT Comments    Pt is very motivated to recover, pushing herself to increase ambulation distance during today's session. Mildly antalgic gait secondary to LE pain. Plan for stair negotiation next session. Pt declined today as she just had the wound vac placed and reported increased pain, however agreeable to stair training next PT session. She would benefit from continued acute PT to maximize safety with mobility and functional independence. Will continue to follow.     Follow Up Recommendations  Supervision for mobility/OOB     Equipment Recommendations  None recommended by PT    Recommendations for Other Services       Precautions / Restrictions Precautions Precautions: Fall Precaution Comments: Wound Vac Restrictions Weight Bearing Restrictions: No    Mobility  Bed Mobility               General bed mobility comments: in chair on arrival  Transfers Overall transfer level: Needs assistance Equipment used: Rolling walker (2 wheeled) Transfers: Sit to/from Stand Sit to Stand: Min guard         General transfer comment: Cueing for technique and hand placement, Min guard for safety  Ambulation/Gait Ambulation/Gait assistance: Min guard Ambulation Distance (Feet): 500 Feet Assistive device: Rolling walker (2 wheeled);None Gait Pattern/deviations: Step-through pattern;Decreased stride length;Antalgic     General Gait Details: pt with mildly antalgic gait secondary to pain.    Stairs         General stair comments: pt declined stairs today  Wheelchair Mobility    Modified Rankin (Stroke Patients Only)       Balance Overall balance assessment: Needs assistance Sitting-balance support: Feet supported Sitting balance-Leahy Scale: Good     Standing balance support: Bilateral upper extremity supported;During functional activity Standing balance-Leahy Scale: Fair                              Cognition Arousal/Alertness: Awake/alert Behavior During Therapy: WFL for tasks assessed/performed Overall Cognitive Status: Within Functional Limits for tasks assessed                                        Exercises      General Comments General comments (skin integrity, edema, etc.): Pt very motivated, pushing herself to walk farther today.      Pertinent Vitals/Pain Pain Assessment: Faces Faces Pain Scale: Hurts a little bit Pain Location: Pain at wound vac site Pain Descriptors / Indicators: Sore Pain Intervention(s): Monitored during session    Home Living                      Prior Function            PT Goals (current goals can now be found in the care plan section) Acute Rehab PT Goals Patient  Stated Goal: Return home PT Goal Formulation: With patient Time For Goal Achievement: 03/20/17 Potential to Achieve Goals: Good Progress towards PT goals: Progressing toward goals    Frequency    Min 3X/week      PT Plan Current plan remains appropriate    Co-evaluation              AM-PAC PT "6 Clicks" Daily Activity  Outcome Measure  Difficulty turning over in bed (including adjusting bedclothes, sheets and blankets)?: Unable Difficulty moving from lying on back to sitting on the side of the bed? : Unable Difficulty sitting down on and standing up from a chair with arms (e.g., wheelchair, bedside commode,  etc,.)?: Unable Help needed moving to and from a bed to chair (including a wheelchair)?: A Little Help needed walking in hospital room?: A Little Help needed climbing 3-5 steps with a railing? : A Little 6 Click Score: 12    End of Session Equipment Utilized During Treatment: Gait belt Activity Tolerance: Patient tolerated treatment well Patient left: in chair;with call bell/phone within reach Nurse Communication: Mobility status PT Visit Diagnosis: Other abnormalities of gait and mobility (R26.89);Muscle weakness (generalized) (M62.81);Difficulty in walking, not elsewhere classified (R26.2)     Time: 2800-3491 PT Time Calculation (min) (ACUTE ONLY): 20 min  Charges:  $Gait Training: 8-22 mins                    G Codes:       Benjiman Core, [place] Pager [phone] Acute Rehab   Allena Katz 03/08/2017, 2:32 PM

## 2017-03-08 NOTE — Consult Note (Signed)
[place] Nurse wound consult note Reason for Consult:apply wound vac Wound type: full thickness surgical wound Pressure Injury POA: NA Measurement:10.5 x 4.5cm x 5.5cm Wound bed:100% red granulation tissue with small piece of tendon showing at bed of deep wound. Drainage (amount, consistency, odor) copious sanguinous drainage was on dressing I removed, no odor. Periwound:intact Dressing procedure/placement/frequency: I cleansed wound, patted dry, applied layer of Mepitel over tendon, filled space with two pieces of black foam due to depth, applied drape, attached to 167mm Hg continuous suction and immediate seal was achieved. Pt tolerated well even though she only had Tylenol for pre-med. [facility] team will follow and perform dressing M-W-F. Pt's lab levels suggest malnutrition despite her obesity. Recommend supplements or Nutritional Consult, please order if you agree. We will follow this patient and remain available to this patient, nursing, and the medical and surgical teams.  Please re-consult if we need to assist further before our visit Friday.   Fara Olden, RN-C, WTA-C Wound Treatment Associate

## 2017-03-08 NOTE — Progress Notes (Addendum)
  Progress Note    03/08/2017 7:51 AM 3 Days Post-Op  Subjective:  Sitting up eating breakfast  Afebrile VSS  Vitals:   03/08/17 0400 03/08/17 0409  BP:  99/84  Pulse:  (!) 45  Resp: 17 17  Temp:  97.8 F (36.6 C)  SpO2:  98%    Physical Exam: General:  No distress; sitting up eating breakfast Lungs:  Non labored   CBC    Component Value Date/Time   WBC 11.5 (H) 03/08/2017 0308   RBC 3.57 (L) 03/08/2017 0308   HGB 11.4 (L) 03/08/2017 0308   HCT 35.6 (L) 03/08/2017 0308   PLT 182 03/08/2017 0308   MCV 99.7 03/08/2017 0308   MCH 31.9 03/08/2017 0308   MCHC 32.0 03/08/2017 0308   RDW 13.3 03/08/2017 0308   LYMPHSABS 1.1 03/04/2017 1004   MONOABS 1.5 (H) 03/04/2017 1004   EOSABS 0.1 03/04/2017 1004   BASOSABS 0.0 03/04/2017 1004    BMET    Component Value Date/Time   NA 138 03/08/2017 0308   K 3.7 03/08/2017 0308   CL 104 03/08/2017 0308   CO2 28 03/08/2017 0308   GLUCOSE 147 (H) 03/08/2017 0308   BUN 20 03/08/2017 0308   CREATININE 1.10 (H) 03/08/2017 0308   CALCIUM 8.2 (L) 03/08/2017 0308   GFRNONAA 49 (L) 03/08/2017 0308   GFRAA 57 (L) 03/08/2017 0308    INR    Component Value Date/Time   INR 2.24 03/08/2017 0308   INR 2.2 05/07/2010 1039     Intake/Output Summary (Last 24 hours) at 03/08/2017 0751 Last data filed at 03/08/2017 0202 Gross per 24 hour  Intake 898.4 ml  Output 1100 ml  Net -201.6 ml   Wound Culture 03/05/17: Specimen Description LEG RIGHT   Special Requests NONE   Gram Stain MODERATE WBC PRESENT, PREDOMINANTLY PMN  RARE GRAM POSITIVE COCCI IN PAIRS     Culture MODERATE STAPHYLOCOCCUS AUREUS  SUSCEPTIBILITIES TO FOLLOW  NO ANAEROBES ISOLATED; CULTURE IN PROGRESS FOR 5 DAYS     Report Status PENDING      Assessment:  71 y.o. female is s/p:  Incision and drainage right below-knee popliteal incision  3 Days Post-Op  Plan: -did not take dressing down this morning as pt was eating  -MRSA on culture-susceptibilities  pending -leukocytosis improving -WOC consulted this am to place vac to RLE wound -continue argatroban to coumadin bridge per pharmacy-pt received coumadin 6mg  last evening. -case management consulted for home wound vac   Leontine Locket, PA-C [facility] [phone] 03/08/2017 7:51 AM  Still awaiting VAC Coumadin restarted Home when [facility] and anticoagulation straight  Ruta Hinds, MD [facility] [place]: [phone] Pager: [phone]

## 2017-03-08 NOTE — Progress Notes (Signed)
PROGRESS NOTE [facility]   [name]   MWN:[id number] DOB: 29-Dec-1945  DOA: 03/04/2017 PCP: Josetta Huddle, MD   Brief Narrative:  [name] is a 71 y.o.femalewith medical history significantA. fib on Coumadin and prior embolic events, stroke, hypertension, recent popliteal artery embolismcausing ischemia right legin the setting of subtherapeutic INR and lupus anticoagulant,status post embolectomy 2 weeks ago presents to the emergency department with the chief complaint of 2 day history of right leg swelling. Initial evaluation reveals tachycardia leukocytosis elevated lactic acid.Patient admitted with sepsis, and post operative wound infection. Vascular was consulted. Patient underwent  incision and drainage of abscess right below knee popliteal incision on 12-09. She is on IV antibiotics. Wound vac has been placed. Adjusting anticoagulation to therapeutic levels.   Subjective: Patient seen and examined, she has no complains. Continues to do well. Remains afebrile. Pain is well controlled, wound vac placed.   Assessment & Plan: Sepsis due to right lower extremity cellulitis with abscess at embolectomy site. Sepsis physiology has resolved Underwent I&D of abscess right knee popliteal incision on 12/9 WBC trending down Vascular surgery recommendations appreciated Wound culture grew moderate MRSA Patient initially treated with vancomycin and Zosyn, can discontinue Zosyn.  Continue vancomycin for now will discuss with ID oral options for outpatient therapy. Wound VAC in place  Acute renal failure in setting of sepsis Treated with IV fluid Treat underlying cause, creatinine back to baseline  A. fib on Coumadin On Coumadin at home, Coumadin was held due to surgical procedure, therefore patient was started on argatroban. Now warfarin has been resumed and bridging to therapeutic levels.   Popliteal artery embolus status post embolectomy Dr. Eden Lathe  following  Chronic diastolic heart failure Compensated Continue Lasix Monitor renal function  DVT prophylaxis: Coumadin and argatroban Code Status: Full code Family Communication: None at bedside Disposition Plan: Hopefully in the next 1-2 days   Consultants:   Vascular surgery  Procedures:   I&D 12/9  Antimicrobials: Anti-infectives (From admission, onward)   Start     Dose/Rate Route Frequency Ordered Stop   03/07/17 0200  vancomycin (VANCOCIN) IVPB 750 mg/150 ml premix     750 mg 150 mL/hr over 60 Minutes Intravenous Every 12 hours 03/06/17 1404     03/05/17 0100  vancomycin (VANCOCIN) IVPB 1000 mg/200 mL premix     1,000 mg 200 mL/hr over 60 Minutes Intravenous Every 12 hours 03/04/17 1242 03/06/17 1447   03/04/17 2000  piperacillin-tazobactam (ZOSYN) IVPB 3.375 g     3.375 g 12.5 mL/hr over 240 Minutes Intravenous Every 8 hours 03/04/17 1411     03/04/17 1415  piperacillin-tazobactam (ZOSYN) IVPB 3.375 g     3.375 g 100 mL/hr over 30 Minutes Intravenous  Once 03/04/17 1400 03/04/17 1618   03/04/17 1415  vancomycin (VANCOCIN) IVPB 1000 mg/200 mL premix  Status:  Discontinued     1,000 mg 200 mL/hr over 60 Minutes Intravenous  Once 03/04/17 1400 03/04/17 1421   03/04/17 1300  vancomycin (VANCOCIN) 2,000 mg in sodium chloride 0.9 % 500 mL IVPB     2,000 mg 250 mL/hr over 120 Minutes Intravenous  Once 03/04/17 1242 03/04/17 1618        Objective: Vitals:   03/08/17 0400 03/08/17 0409 03/08/17 0821 03/08/17 1541  BP:  99/84 122/69 (!) 148/80  Pulse:  (!) 45    Resp: 17 17 19 18   Temp:  97.8 F (36.6 C)  98.9 F (37.2 C)  TempSrc:  Oral  Oral  SpO2:  98%    Weight:  129.3 kg (285 lb 0.9 oz)    Height:        Intake/Output Summary (Last 24 hours) at 03/08/2017 1552 Last data filed at 03/08/2017 1546 Gross per 24 hour  Intake 270 ml  Output 1450 ml  Net -1180 ml   Filed Weights   03/06/17 0409 03/07/17 0700 03/08/17 0409  Weight: 128.9 kg (284 lb 2.8  oz) 129.5 kg (285 lb 7.9 oz) 129.3 kg (285 lb 0.9 oz)    Examination:  General exam: Appears calm and comfortable  HEENT: OP moist and clear Respiratory system: Clear to auscultation. No wheezes,crackle or rhonchi Cardiovascular system: S1 & S2 heard, RRR. No JVD, murmurs, rubs or gallops Gastrointestinal system: Abdomen is nondistended, soft and nontender. Normal bowel sounds heard. Central nervous system: Alert and oriented. No focal neurological deficits. Extremities: No pedal edema.  Donielle appears out of vital Skin: Dressing in place c/d/i.  Psychiatry: Mood & affect appropriate.   Data Reviewed: I have personally reviewed following labs and imaging studies  CBC: Recent Labs  Lab 03/04/17 1004 03/05/17 0306 03/06/17 0311 03/07/17 0231 03/08/17 0308  WBC 22.8* 18.3* 12.9* 15.9* 11.5*  NEUTROABS 20.2*  --   --   --   --   HGB 13.9 12.1 11.6* 11.7* 11.4*  HCT 41.8 37.4 35.5* 36.0 35.6*  MCV 99.5 101.4* 100.0 100.0 99.7  PLT 171 164 162 168 469   Basic Metabolic Panel: Recent Labs  Lab 03/04/17 1004 03/05/17 0306 03/06/17 0311 03/07/17 1007 03/08/17 0308  NA 132* 136 136 137 138  K 3.9 3.9 3.8 3.6 3.7  CL 97* 101 102 104 104  CO2 26 27 27 26 28   GLUCOSE 205* 127* 227* 149* 147*  BUN 12 11 14  21* 20  CREATININE 1.16* 1.10* 1.02* 0.99 1.10*  CALCIUM 8.4* 8.0* 8.3* 8.5* 8.2*   GFR: Estimated Creatinine Clearance: 64.6 mL/min (A) (by C-G formula based on SCr of 1.1 mg/dL (H)). Liver Function Tests: Recent Labs  Lab 03/04/17 1004 03/08/17 0308  AST 18 17  ALT 14 18  ALKPHOS 95 81  BILITOT 2.1* 0.8  PROT 7.3 6.4*  ALBUMIN 2.9* 2.4*   No results for input(s): LIPASE, AMYLASE in the last 168 hours. No results for input(s): AMMONIA in the last 168 hours. Coagulation Profile: Recent Labs  Lab 03/05/17 0306 03/05/17 1546 03/06/17 0311 03/07/17 0231 03/08/17 0308  INR 2.68 1.57 5.82* 2.37 2.24   Cardiac Enzymes: No results for input(s): CKTOTAL,  CKMB, CKMBINDEX, TROPONINI in the last 168 hours. BNP (last 3 results) No results for input(s): PROBNP in the last 8760 hours. HbA1C: No results for input(s): HGBA1C in the last 72 hours. CBG: No results for input(s): GLUCAP in the last 168 hours. Lipid Profile: No results for input(s): CHOL, HDL, LDLCALC, TRIG, CHOLHDL, LDLDIRECT in the last 72 hours. Thyroid Function Tests: No results for input(s): TSH, T4TOTAL, FREET4, T3FREE, THYROIDAB in the last 72 hours. Anemia Panel: No results for input(s): VITAMINB12, FOLATE, FERRITIN, TIBC, IRON, RETICCTPCT in the last 72 hours. Sepsis Labs: Recent Labs  Lab 03/04/17 1018 03/04/17 1238 03/04/17 1415 03/04/17 1644  LATICACIDVEN 1.85 2.87* 1.4 1.4    Recent Results (from the past 240 hour(s))  Culture, blood (Routine x 2)     Status: None (Preliminary result)   Collection Time: 03/04/17 10:10 AM  Result Value Ref Range Status   Specimen Description BLOOD RIGHT ANTECUBITAL  Final   Special Requests  Final    BOTTLES DRAWN AEROBIC AND ANAEROBIC Blood Culture adequate volume   Culture NO GROWTH 4 DAYS  Final   Report Status PENDING  Incomplete  Culture, blood (Routine x 2)     Status: None (Preliminary result)   Collection Time: 03/04/17 12:20 PM  Result Value Ref Range Status   Specimen Description BLOOD RIGHT HAND  Final   Special Requests   Final    BOTTLES DRAWN AEROBIC ONLY Blood Culture adequate volume   Culture NO GROWTH 4 DAYS  Final   Report Status PENDING  Incomplete  Aerobic/Anaerobic Culture (surgical/deep wound)     Status: None (Preliminary result)   Collection Time: 03/05/17 12:44 PM  Result Value Ref Range Status   Specimen Description LEG RIGHT  Final   Special Requests NONE  Final   Gram Stain   Final    MODERATE WBC PRESENT, PREDOMINANTLY PMN RARE GRAM POSITIVE COCCI IN PAIRS    Culture   Final    MODERATE METHICILLIN RESISTANT STAPHYLOCOCCUS AUREUS NO ANAEROBES ISOLATED; CULTURE IN PROGRESS FOR 5 DAYS     Report Status PENDING  Incomplete   Organism ID, Bacteria METHICILLIN RESISTANT STAPHYLOCOCCUS AUREUS  Final      Susceptibility   Methicillin resistant staphylococcus aureus - MIC*    CIPROFLOXACIN >=8 RESISTANT Resistant     ERYTHROMYCIN >=8 RESISTANT Resistant     GENTAMICIN <=0.5 SENSITIVE Sensitive     OXACILLIN >=4 RESISTANT Resistant     TETRACYCLINE <=1 SENSITIVE Sensitive     VANCOMYCIN 1 SENSITIVE Sensitive     TRIMETH/SULFA <=10 SENSITIVE Sensitive     CLINDAMYCIN >=8 RESISTANT Resistant     RIFAMPIN <=0.5 SENSITIVE Sensitive     Inducible Clindamycin NEGATIVE Sensitive     * MODERATE METHICILLIN RESISTANT STAPHYLOCOCCUS AUREUS     Radiology Studies: No results found.   Scheduled Meds: . aspirin EC  81 mg Oral Daily  . cholecalciferol  1,000 Units Oral Daily  . diltiazem  300 mg Oral Daily  . furosemide  40 mg Oral Daily  . metoprolol tartrate  12.5 mg Oral BID  . warfarin  6 mg Oral ONCE-1800  . Warfarin - Pharmacist Dosing Inpatient   Does not apply q1800   Continuous Infusions: . argatroban 0.35 mcg/kg/min (03/08/17 0818)  . piperacillin-tazobactam (ZOSYN)  IV Stopped (03/08/17 1617)  . vancomycin Stopped (03/08/17 1549)     LOS: 3 days    Time spent: Total of 25  minutes spent with pt, greater than 50% of which was spent in discussion of  treatment, counseling and coordination of care.  Chipper Oman, MD Pager: Text Page via [email or web address]   If 7PM-7AM, please contact night-coverage [email or web address] 03/08/2017, 3:52 PM

## 2017-03-09 DIAGNOSIS — A4102 Sepsis due to Methicillin resistant Staphylococcus aureus: Secondary | ICD-10-CM

## 2017-03-09 LAB — BASIC METABOLIC PANEL
ANION GAP: 7 (ref 5–15)
BUN: 16 mg/dL (ref 6–20)
CALCIUM: 8.2 mg/dL — AB (ref 8.9–10.3)
CO2: 30 mmol/L (ref 22–32)
Chloride: 101 mmol/L (ref 101–111)
Creatinine, Ser: 1 mg/dL (ref 0.44–1.00)
GFR calc Af Amer: >60 mL/min (ref >60)
GFR, EST NON AFRICAN AMERICAN: 55 mL/min — AB (ref >60)
GLUCOSE: 118 mg/dL — AB (ref 65–99)
POTASSIUM: 3.5 mmol/L (ref 3.5–5.1)
Sodium: 138 mmol/L (ref 135–145)

## 2017-03-09 LAB — PROTIME-INR
INR: 3.13
Prothrombin Time: 31.9 seconds — ABNORMAL HIGH (ref 11.4–15.2)

## 2017-03-09 LAB — CULTURE, BLOOD (ROUTINE X 2)
CULTURE: NO GROWTH
Culture: NO GROWTH
SPECIAL REQUESTS: ADEQUATE
Special Requests: ADEQUATE

## 2017-03-09 LAB — APTT: APTT: 61 s — AB (ref 24–36)

## 2017-03-09 MED ORDER — WARFARIN SODIUM 3 MG PO TABS
6.0000 mg | ORAL_TABLET | Freq: Once | ORAL | Status: AC
  Administered 2017-03-09: 6 mg via ORAL
  Filled 2017-03-09: qty 2

## 2017-03-09 NOTE — Care Management Note (Signed)
Case Management Note  Patient Details  Name: [name] MRN: [id number] Date of Birth: June 08, 1945  Subjective/Objective:            Pt status post  I&D of right below-knee popliteal incision on 12/9.  Action/Plan: Referral received for wound vac needs. In to speak with Pt, offered choice for [facility].  Discussed recommendations made for Wound Vac and HH/RN.  Pt selected [facility].  Pt plans to go to daughter "[name]" home post discharge at: [address] or [address] [place], [place] [address].  Phone: [phone].  Pt states she thinks she has the following DME equipment at home will check for: walker, bedside commode.  NCM will follow for this and arrange.    PCP noted. Home wound vac form placed on shadow chart. Will follow up once signed. Referral called to Butch Penny at [facility] regarding Wound vac needs and home health referral.  Pt will need [place] orders prior to discharge.  Expected Discharge Date:                  Expected Discharge Plan:  [place]  In-House Referral:  NA  Discharge planning Services  CM Consult  Post Acute Care Choice:  Home Health, Durable Medical Equipment Choice offered to:  Patient  DME Arranged:  Vac DME Agency:  [place] Arranged:  RN [facility] Agency:  [place]  Status of Service:  In process, will continue to follow  If discussed at Long Length of Stay Meetings, dates discussed:    Discharge Disposition:   Additional Comments:  03/09/17- 1615- [name] RN, CM- [facility] wound VAC form has been signed by vascular- [place] working on approval- also was notified by [facility] of vascular office referral for [facility] needs- spoke with pt and discussed referral- per pt she prefers to stay with [facility] for [facility] needs- have notified [place] with [facility] that pt will stay with [facility] for [facility] services- CM will continue to follow.   Dawayne Patricia, RN 03/09/2017, 4:18 PM

## 2017-03-09 NOTE — Progress Notes (Signed)
Patient up to chair x1 today. Will monitor patient. Lilyahna Sirmon, Bettina Gavia rN

## 2017-03-09 NOTE — Progress Notes (Signed)
PROGRESS NOTE [facility]   [name]   JJK:[id number] DOB: [birth year]  DOA: 03/04/2017 PCP: Josetta Huddle, MD   Brief Narrative:  [name] is a 71 y.o.femalewith medical history significantA. fib on Coumadin and prior embolic events, stroke, hypertension, recent popliteal artery embolismcausing ischemia right legin the setting of subtherapeutic INR and lupus anticoagulant,status post embolectomy 2 weeks ago presents to the emergency department with the chief complaint of 2 day history of right leg swelling. Initial evaluation reveals tachycardia leukocytosis elevated lactic acid.Patient admitted with sepsis, and post operative wound infection. Vascular was consulted. Patient underwent  incision and drainage of abscess right below knee popliteal incision on 12-09. She is on IV antibiotics. Wound vac has been placed. Adjusting anticoagulation to therapeutic levels.   Subjective: Patient seen and examined, denies chest pain, sob and weakness. Pain is controlled, wound vac draining well. No acute events overnight.    Assessment & Plan: Sepsis due to right lower extremity cellulitis with abscess at embolectomy site. Sepsis physiology has resolved Underwent I&D of abscess right knee popliteal incision on 12/9 WBC trending down Vascular surgery recommendations appreciated Wound culture grew moderate MRSA Patient initially treated with vancomycin and Zosyn, can discontinue Zosyn.  Continue vancomycin while inpatient - on discharge with d/c on doxy 100 mg BID x 10 days.  Wound VAC in place  Acute renal failure in setting of sepsis - resolved  Treated with IV fluid Treat underlying cause, creatinine back to baseline  A. fib on Coumadin On Coumadin at home, Coumadin was held due to surgical procedure, therefore patient was started on argatroban. Continue bridge warfarin, remains on argatroban. INR 3.1, she probably would be ok to discharge if INR close to 4.5. Continue  to monitor  Popliteal artery embolus status post embolectomy Dr. Eden Lathe following  Chronic diastolic heart failure Compensated Continue Lasix Monitor renal function  DVT prophylaxis: Coumadin and argatroban Code Status: Full code Family Communication: None at bedside Disposition Plan: Hopefully in the next 1-2 days   Consultants:   Vascular surgery  Procedures:   I&D 12/9  Antimicrobials: Anti-infectives (From admission, onward)   Start     Dose/Rate Route Frequency Ordered Stop   03/07/17 0200  vancomycin (VANCOCIN) IVPB 750 mg/150 ml premix     750 mg 150 mL/hr over 60 Minutes Intravenous Every 12 hours 03/06/17 1404     03/05/17 0100  vancomycin (VANCOCIN) IVPB 1000 mg/200 mL premix     1,000 mg 200 mL/hr over 60 Minutes Intravenous Every 12 hours 03/04/17 1242 03/06/17 1447   03/04/17 2000  piperacillin-tazobactam (ZOSYN) IVPB 3.375 g  Status:  Discontinued     3.375 g 12.5 mL/hr over 240 Minutes Intravenous Every 8 hours 03/04/17 1411 03/08/17 1944   03/04/17 1415  piperacillin-tazobactam (ZOSYN) IVPB 3.375 g     3.375 g 100 mL/hr over 30 Minutes Intravenous  Once 03/04/17 1400 03/04/17 1618   03/04/17 1415  vancomycin (VANCOCIN) IVPB 1000 mg/200 mL premix  Status:  Discontinued     1,000 mg 200 mL/hr over 60 Minutes Intravenous  Once 03/04/17 1400 03/04/17 1421   03/04/17 1300  vancomycin (VANCOCIN) 2,000 mg in sodium chloride 0.9 % 500 mL IVPB     2,000 mg 250 mL/hr over 120 Minutes Intravenous  Once 03/04/17 1242 03/04/17 1618        Objective: Vitals:   03/09/17 0457 03/09/17 0900 03/09/17 0944 03/09/17 1525  BP: (!) 151/87 (!) 151/87 (!) 143/89 135/68  Pulse: (!) 58 Marland Kitchen)  106 62 61  Resp: 18 18 16 18   Temp: 97.8 F (36.6 C) 98.1 F (36.7 C)  (!) 97.3 F (36.3 C)  TempSrc: Oral Oral  Oral  SpO2:    97%  Weight: 128.7 kg (283 lb 11.7 oz)     Height:        Intake/Output Summary (Last 24 hours) at 03/09/2017 1624 Last data filed at 03/09/2017  1419 Gross per 24 hour  Intake 502.16 ml  Output 2200 ml  Net -1697.84 ml   Filed Weights   03/07/17 0700 03/08/17 0409 03/09/17 0457  Weight: 129.5 kg (285 lb 7.9 oz) 129.3 kg (285 lb 0.9 oz) 128.7 kg (283 lb 11.7 oz)    Examination:  General: Pt is alert, awake, not in acute distress Cardiovascular: RRR, S1/S2 +, no rubs, no gallops Respiratory: CTA bilaterally, no wheezing, no rhonchi Abdominal: Soft, NT, ND, bowel sounds + Extremities: Wound vac in place, drianing serosanguineous material.   Data Reviewed: I have personally reviewed following labs and imaging studies  CBC: Recent Labs  Lab 03/04/17 1004 03/05/17 0306 03/06/17 0311 03/07/17 0231 03/08/17 0308  WBC 22.8* 18.3* 12.9* 15.9* 11.5*  NEUTROABS 20.2*  --   --   --   --   HGB 13.9 12.1 11.6* 11.7* 11.4*  HCT 41.8 37.4 35.5* 36.0 35.6*  MCV 99.5 101.4* 100.0 100.0 99.7  PLT 171 164 162 168 474   Basic Metabolic Panel: Recent Labs  Lab 03/05/17 0306 03/06/17 0311 03/07/17 1007 03/08/17 0308 03/09/17 0323  NA 136 136 137 138 138  K 3.9 3.8 3.6 3.7 3.5  CL 101 102 104 104 101  CO2 27 27 26 28 30   GLUCOSE 127* 227* 149* 147* 118*  BUN 11 14 21* 20 16  CREATININE 1.10* 1.02* 0.99 1.10* 1.00  CALCIUM 8.0* 8.3* 8.5* 8.2* 8.2*   GFR: Estimated Creatinine Clearance: 71 mL/min (by C-G formula based on SCr of 1 mg/dL). Liver Function Tests: Recent Labs  Lab 03/04/17 1004 03/08/17 0308  AST 18 17  ALT 14 18  ALKPHOS 95 81  BILITOT 2.1* 0.8  PROT 7.3 6.4*  ALBUMIN 2.9* 2.4*   No results for input(s): LIPASE, AMYLASE in the last 168 hours. No results for input(s): AMMONIA in the last 168 hours. Coagulation Profile: Recent Labs  Lab 03/05/17 1546 03/06/17 0311 03/07/17 0231 03/08/17 0308 03/09/17 0323  INR 1.57 5.82* 2.37 2.24 3.13   Cardiac Enzymes: No results for input(s): CKTOTAL, CKMB, CKMBINDEX, TROPONINI in the last 168 hours. BNP (last 3 results) No results for input(s): PROBNP in  the last 8760 hours. HbA1C: No results for input(s): HGBA1C in the last 72 hours. CBG: No results for input(s): GLUCAP in the last 168 hours. Lipid Profile: No results for input(s): CHOL, HDL, LDLCALC, TRIG, CHOLHDL, LDLDIRECT in the last 72 hours. Thyroid Function Tests: No results for input(s): TSH, T4TOTAL, FREET4, T3FREE, THYROIDAB in the last 72 hours. Anemia Panel: No results for input(s): VITAMINB12, FOLATE, FERRITIN, TIBC, IRON, RETICCTPCT in the last 72 hours. Sepsis Labs: Recent Labs  Lab 03/04/17 1018 03/04/17 1238 03/04/17 1415 03/04/17 1644  LATICACIDVEN 1.85 2.87* 1.4 1.4    Recent Results (from the past 240 hour(s))  Culture, blood (Routine x 2)     Status: None   Collection Time: 03/04/17 10:10 AM  Result Value Ref Range Status   Specimen Description BLOOD RIGHT ANTECUBITAL  Final   Special Requests   Final    BOTTLES DRAWN AEROBIC AND ANAEROBIC  Blood Culture adequate volume   Culture NO GROWTH 5 DAYS  Final   Report Status 03/09/2017 FINAL  Final  Culture, blood (Routine x 2)     Status: None   Collection Time: 03/04/17 12:20 PM  Result Value Ref Range Status   Specimen Description BLOOD RIGHT HAND  Final   Special Requests   Final    BOTTLES DRAWN AEROBIC ONLY Blood Culture adequate volume   Culture NO GROWTH 5 DAYS  Final   Report Status 03/09/2017 FINAL  Final  Aerobic/Anaerobic Culture (surgical/deep wound)     Status: None (Preliminary result)   Collection Time: 03/05/17 12:44 PM  Result Value Ref Range Status   Specimen Description LEG RIGHT  Final   Special Requests NONE  Final   Gram Stain   Final    MODERATE WBC PRESENT, PREDOMINANTLY PMN RARE GRAM POSITIVE COCCI IN PAIRS    Culture   Final    MODERATE METHICILLIN RESISTANT STAPHYLOCOCCUS AUREUS NO ANAEROBES ISOLATED; CULTURE IN PROGRESS FOR 5 DAYS    Report Status PENDING  Incomplete   Organism ID, Bacteria METHICILLIN RESISTANT STAPHYLOCOCCUS AUREUS  Final      Susceptibility    Methicillin resistant staphylococcus aureus - MIC*    CIPROFLOXACIN >=8 RESISTANT Resistant     ERYTHROMYCIN >=8 RESISTANT Resistant     GENTAMICIN <=0.5 SENSITIVE Sensitive     OXACILLIN >=4 RESISTANT Resistant     TETRACYCLINE <=1 SENSITIVE Sensitive     VANCOMYCIN 1 SENSITIVE Sensitive     TRIMETH/SULFA <=10 SENSITIVE Sensitive     CLINDAMYCIN >=8 RESISTANT Resistant     RIFAMPIN <=0.5 SENSITIVE Sensitive     Inducible Clindamycin NEGATIVE Sensitive     * MODERATE METHICILLIN RESISTANT STAPHYLOCOCCUS AUREUS     Radiology Studies: No results found.   Scheduled Meds: . aspirin EC  81 mg Oral Daily  . cholecalciferol  1,000 Units Oral Daily  . diltiazem  300 mg Oral Daily  . furosemide  40 mg Oral Daily  . metoprolol tartrate  12.5 mg Oral BID  . warfarin  6 mg Oral ONCE-1800  . Warfarin - Pharmacist Dosing Inpatient   Does not apply q1800   Continuous Infusions: . argatroban 0.35 mcg/kg/min (03/09/17 1419)  . vancomycin 750 mg (03/09/17 1313)     LOS: 4 days    Time spent: Total of 15  minutes spent with pt, greater than 50% of which was spent in discussion of  treatment, counseling and coordination of care.  Chipper Oman, MD Pager: Text Page via [email or web address]   If 7PM-7AM, please contact night-coverage [email or web address] 03/09/2017, 4:24 PM

## 2017-03-09 NOTE — Progress Notes (Addendum)
[place] for agratroban/coumadin Indication: atrial fibrillation  Allergies  Allergen Reactions  . Heparin Hives    Patient attests severe allergy- rash all over, severe itching, unable to take heparin  . Other Itching and Rash    "Sheets and Linens used by Zacarias Pontes"    Patient Measurements: Height: 5\' 6"  (167.6 cm) Weight: 283 lb 11.7 oz (128.7 kg) IBW/kg (Calculated) : 59.3  Vital Signs: Temp: 97.8 F (36.6 C) (12/13 0457) Temp Source: Oral (12/13 0457) BP: 151/87 (12/13 0457) Pulse Rate: 58 (12/13 0457)  Labs: Recent Labs    03/07/17 0231 03/07/17 1007 03/08/17 0308 03/09/17 0323  HGB 11.7*  --  11.4*  --   HCT 36.0  --  35.6*  --   PLT 168  --  182  --   APTT 64*  --  50* 61*  LABPROT 25.7*  --  24.6* 31.9*  INR 2.37  --  2.24 3.13  CREATININE  --  0.99 1.10* 1.00    Estimated Creatinine Clearance: 71 mL/min (by C-G formula based on SCr of 1 mg/dL).   Medical History: Past Medical History:  Diagnosis Date  . (HFpEF) heart failure with preserved ejection fraction ([place])    a. 06/2008 Echo: EF 55-60%, mild LVH, triv AI, mild to mod MS, mod to sev dil LA, mildly dil RA.  Marland Kitchen Acute right MCA stroke ([place])    a. 07/31/2561 Embolic stroke treated with TPA and thrombectomy with hemorrhagic transformation; Carotids 3/12 negative for ICA stenosis.  . Bilateral Pulmonary Emboli    a. 08/2005 - s/p IVC filter.  . Depression   . Embolus of femoral artery ([place])    a. 06/2008 s/p bilateral embolectomies.  . History of DVT (deep vein thrombosis)    a. s/p IVC filter.  Marland Kitchen HIT (heparin-induced thrombocytopenia) ([place])   . HTN (hypertension)   . Lupus anticoagulant disorder ([place])   . Obesity, unspecified   . Permanent atrial fibrillation ([place])    a. On chronic Coumadin - INRs followed by PCP; b. CHA2DS2VASc = 5.  . Popliteal artery embolism, right ([place])    a. 01/2017 in the setting of subRx INR-->s/p embolectomy.    Medications:   Scheduled:  . aspirin EC  81 mg Oral Daily  . cholecalciferol  1,000 Units Oral Daily  . diltiazem  300 mg Oral Daily  . furosemide  40 mg Oral Daily  . metoprolol tartrate  12.5 mg Oral BID  . Warfarin - Pharmacist Dosing Inpatient   Does not apply q1800    Assessment: 17 yof is admitted on atrial fibrillation on warfarin for prior embolic events. She was found to have increased swelling and erythema right below knee incision and underwent I&D of right below-knee popliteal incision on 12/9.  She is noted s/p vitamin K 10mg  IV 12/9 and is now on argatroban due to a heparin allergy.  -aPTT= 61 on 0.35 mcg/kg/min -INR= 3.13 (goal typically 4-6 on argatroban but may need to consider at least 4.5 due to the higher goal INR as outpatient)  Home coumadin regimen: to 6 mg daily except 4.5 mg Mon. INR goal as outpatient was 2.5-3.5  Goal of Therapy:  INR goal 4-6 while on argatroban aPTT 50-90 seconds Monitor platelets by anticoagulation protocol: Yes   Plan:  -No argatroban changes -Coumadin 6mg  po today  -Daily aPTT, INR -Spoke to her again about the possibility of lovenox and she does not want to try this  Mitzi Hansen  Stana Bunting D 03/09/2017 9:58 AM

## 2017-03-10 LAB — AEROBIC/ANAEROBIC CULTURE (SURGICAL/DEEP WOUND)

## 2017-03-10 LAB — AEROBIC/ANAEROBIC CULTURE W GRAM STAIN (SURGICAL/DEEP WOUND)

## 2017-03-10 LAB — PROTIME-INR
INR: 2.82
PROTHROMBIN TIME: 29.5 s — AB (ref 11.4–15.2)

## 2017-03-10 LAB — APTT: APTT: 57 s — AB (ref 24–36)

## 2017-03-10 MED ORDER — WARFARIN SODIUM 5 MG PO TABS
9.0000 mg | ORAL_TABLET | Freq: Once | ORAL | Status: AC
  Administered 2017-03-10: 11:00:00 9 mg via ORAL
  Filled 2017-03-10: qty 1

## 2017-03-10 MED ORDER — WARFARIN SODIUM 5 MG PO TABS: 9.0000 mg | ORAL_TABLET | Freq: Once | ORAL | Status: DC

## 2017-03-10 NOTE — Progress Notes (Addendum)
[facility]  Subjective  - rested well.   Objective (!) 152/87 67 97.8 F (36.6 C) (Oral) 19 95%  Intake/Output Summary (Last 24 hours) at 03/10/2017 0657 Last data filed at 03/10/2017 0650 Gross per 24 hour  Intake 521.76 ml  Output 1370 ml  Net -848.24 ml   Right leg wound vac in place  Lungs non labored breathing Heart RRR   Assessment/Planning: 71 y.o. female is s/p:  Incision and drainage right below-knee popliteal incision  4 Days Post-Op  Specimen Description LEG RIGHT   Special Requests NONE   Gram Stain MODERATE WBC PRESENT, PREDOMINANTLY PMN  RARE GRAM POSITIVE COCCI IN PAIRS     Culture MODERATE METHICILLIN RESISTANT STAPHYLOCOCCUS AUREUS  NO ANAEROBES ISOLATED; CULTURE IN PROGRESS FOR 5 DAYS     Report Status PENDING   Organism ID, Bacteria METHICILLIN RESISTANT STAPHYLOCOCCUS AUREUS     Pharmacy is discussing the use of Lovenox for 24 hours although she has a heparin allergy? currenly on argatroban and coumadin and goal INR will need to be between 4-6 to achieve goal. I anticipate she will need another 2-3 days before she reaches goal -INR= 2.82  Wound vac in place right LE.  Will need to be changed T-TH-SAT.  Home vac order signed. WBC 11.5 currently on Vancomycin IV    Roxy Horseman 03/10/2017 6:57 AM --  Laboratory Lab Results: Recent Labs    03/08/17 0308  WBC 11.5*  HGB 11.4*  HCT 35.6*  PLT 182   BMET Recent Labs    03/08/17 0308 03/09/17 0323  NA 138 138  K 3.7 3.5  CL 104 101  CO2 28 30  GLUCOSE 147* 118*  BUN 20 16  CREATININE 1.10* 1.00  CALCIUM 8.2* 8.2*    COAG Lab Results  Component Value Date   INR 2.82 03/10/2017   INR 3.13 03/09/2017   INR 2.24 03/08/2017   PROTIME 18.4 09/12/2008   PROTIME 27.4 08/29/2008   No results found for: PTT

## 2017-03-10 NOTE — Progress Notes (Addendum)
[place] for agratroban/coumadin Indication: atrial fibrillation  Allergies  Allergen Reactions  . Heparin Hives    Patient attests severe allergy- rash all over, severe itching, unable to take heparin  . Other Itching and Rash    "Sheets and Linens used by Zacarias Pontes"    Patient Measurements: Height: 5\' 6"  (167.6 cm) Weight: 274 lb 0.5 oz (124.3 kg) IBW/kg (Calculated) : 59.3  Vital Signs: Temp: 97.8 F (36.6 C) (12/14 0343) Temp Source: Oral (12/14 0343) BP: 152/87 (12/14 0343)  Labs: Recent Labs    03/07/17 1007 03/08/17 0308 03/09/17 0323 03/10/17 0259  HGB  --  11.4*  --   --   HCT  --  35.6*  --   --   PLT  --  182  --   --   APTT  --  50* 61* 57*  LABPROT  --  24.6* 31.9* 29.5*  INR  --  2.24 3.13 2.82  CREATININE 0.99 1.10* 1.00  --     Estimated Creatinine Clearance: 69.5 mL/min (by C-G formula based on SCr of 1 mg/dL).  Assessment: 26 yof is admitted on atrial fibrillation on warfarin for prior embolic events. She was found to have increased swelling and erythema right below knee incision and underwent I&D of right below-knee popliteal incision on 12/9.  She is noted s/p vitamin K 10mg  IV 12/9 and is now on argatroban due to a heparin allergy.  -aPTT= 57 on 0.35 mcg/kg/min -INR= 2.82 (goal typically 4-6 on argatroban but may need to consider at least 4.5 due to the higher goal INR as outpatient)  Home coumadin regimen: to 6 mg daily except 4.5 mg Mon. INR goal as outpatient was 2.5-3.5  Goal of Therapy:  INR goal 4-6 while on argatroban aPTT 50-90 seconds Monitor platelets by anticoagulation protocol: Yes   Plan:  Continue argatroban 0.35 mcg/kg/min Warfarin 9mg  PO x 1 now - giving early to better assess impact on AM INR Daily INR, aPTT and CBC *Of note, pt is unwilling to try lovenox  Salome Arnt, PharmD, BCPS Phone #: [phone] until 3:30pm All other times, call [place] x [phone] 03/10/2017 8:45  AM

## 2017-03-10 NOTE — Progress Notes (Signed)
PROGRESS NOTE [facility]   KRYSTALYNN RIDGEWAY   HCW:[id number] DOB: [birth year]  DOA: 03/04/2017 PCP: Josetta Huddle, MD   Brief Narrative:  [name] is a 71 y.o.femalewith medical history significantA. fib on Coumadin and prior embolic events, stroke, hypertension, recent popliteal artery embolismcausing ischemia right legin the setting of subtherapeutic INR and lupus anticoagulant,status post embolectomy 2 weeks ago presents to the emergency department with the chief complaint of 2 day history of right leg swelling. Initial evaluation reveals tachycardia leukocytosis elevated lactic acid.Patient admitted with sepsis, and post operative wound infection. Vascular was consulted. Patient underwent  incision and drainage of abscess right below knee popliteal incision on 12-09. She is on IV antibiotics. Wound vac has been placed. Adjusting anticoagulation to therapeutic levels.   Subjective: Continues to do well, have no complaints this morning. Remains awaiting for INR to become therapeutic   Assessment & Plan: Sepsis due to right lower extremity cellulitis with abscess at embolectomy site.  Sepsis physiology has resolved Underwent I&D of abscess right knee popliteal incision on 12/9 WBC trended down Vascular surgery recommendations appreciated Wound culture grew moderate MRSA Patient initially treated with vancomycin and Zosyn, - Zosyn d/ced 12/  Continue vancomycin while inpatient - on discharge with d/c on doxy 100 mg BID x 10 days. - check vanc through  Wound VAC in place  Acute renal failure in setting of sepsis - resolved  Treated with IV fluid Treat underlying cause, creatinine back to baseline  A. fib on Coumadin On Coumadin at home, Coumadin was held due to surgical procedure, therefore patient was started on argatroban. Continue bridge warfarin, remains on argatroban. INR 3.1, she probably would be ok to discharge if INR close to 4.5. Continue to  monitor  Popliteal artery embolus status post embolectomy Dr. Eden Lathe following  Chronic diastolic heart failure Compensated Continue Lasix Monitor renal function  DVT prophylaxis: Coumadin and argatroban Code Status: Full code Family Communication: None at bedside Disposition Plan: when INR become therapeutic   Consultants:   Vascular surgery  Procedures:   I&D 12/9  Antimicrobials: Anti-infectives (From admission, onward)   Start     Dose/Rate Route Frequency Ordered Stop   03/07/17 0200  vancomycin (VANCOCIN) IVPB 750 mg/150 ml premix     750 mg 150 mL/hr over 60 Minutes Intravenous Every 12 hours 03/06/17 1404     03/05/17 0100  vancomycin (VANCOCIN) IVPB 1000 mg/200 mL premix     1,000 mg 200 mL/hr over 60 Minutes Intravenous Every 12 hours 03/04/17 1242 03/06/17 1447   03/04/17 2000  piperacillin-tazobactam (ZOSYN) IVPB 3.375 g  Status:  Discontinued     3.375 g 12.5 mL/hr over 240 Minutes Intravenous Every 8 hours 03/04/17 1411 03/08/17 1944   03/04/17 1415  piperacillin-tazobactam (ZOSYN) IVPB 3.375 g     3.375 g 100 mL/hr over 30 Minutes Intravenous  Once 03/04/17 1400 03/04/17 1618   03/04/17 1415  vancomycin (VANCOCIN) IVPB 1000 mg/200 mL premix  Status:  Discontinued     1,000 mg 200 mL/hr over 60 Minutes Intravenous  Once 03/04/17 1400 03/04/17 1421   03/04/17 1300  vancomycin (VANCOCIN) 2,000 mg in sodium chloride 0.9 % 500 mL IVPB     2,000 mg 250 mL/hr over 120 Minutes Intravenous  Once 03/04/17 1242 03/04/17 1618        Objective: Vitals:   03/09/17 0944 03/09/17 1525 03/09/17 1949 03/10/17 0343  BP: (!) 143/89 135/68 130/89 (!) 152/87  Pulse: 62 61 67   Resp:  16 18 20 19   Temp:  (!) 97.3 F (36.3 C) 98.1 F (36.7 C) 97.8 F (36.6 C)  TempSrc:  Oral Oral Oral  SpO2:  97% 99% 95%  Weight:    124.3 kg (274 lb 0.5 oz)  Height:        Intake/Output Summary (Last 24 hours) at 03/10/2017 0948 Last data filed at 03/10/2017 0700 Gross per 24  hour  Intake 572.56 ml  Output 1370 ml  Net -797.44 ml   Filed Weights   03/08/17 0409 03/09/17 0457 03/10/17 0343  Weight: 129.3 kg (285 lb 0.9 oz) 128.7 kg (283 lb 11.7 oz) 124.3 kg (274 lb 0.5 oz)    Examination: - No changes in exam from 12/13  General: Pt is alert, awake, not in acute distress Cardiovascular: RRR, S1/S2 +, no rubs, no gallops Respiratory: CTA bilaterally, no wheezing, no rhonchi Abdominal: Soft, NT, ND, bowel sounds + Extremities: Wound vac in place, drianing serosanguineous material.   Data Reviewed: I have personally reviewed following labs and imaging studies  CBC: Recent Labs  Lab 03/04/17 1004 03/05/17 0306 03/06/17 0311 03/07/17 0231 03/08/17 0308  WBC 22.8* 18.3* 12.9* 15.9* 11.5*  NEUTROABS 20.2*  --   --   --   --   HGB 13.9 12.1 11.6* 11.7* 11.4*  HCT 41.8 37.4 35.5* 36.0 35.6*  MCV 99.5 101.4* 100.0 100.0 99.7  PLT 171 164 162 168 767   Basic Metabolic Panel: Recent Labs  Lab 03/05/17 0306 03/06/17 0311 03/07/17 1007 03/08/17 0308 03/09/17 0323  NA 136 136 137 138 138  K 3.9 3.8 3.6 3.7 3.5  CL 101 102 104 104 101  CO2 27 27 26 28 30   GLUCOSE 127* 227* 149* 147* 118*  BUN 11 14 21* 20 16  CREATININE 1.10* 1.02* 0.99 1.10* 1.00  CALCIUM 8.0* 8.3* 8.5* 8.2* 8.2*   GFR: Estimated Creatinine Clearance: 69.5 mL/min (by C-G formula based on SCr of 1 mg/dL). Liver Function Tests: Recent Labs  Lab 03/04/17 1004 03/08/17 0308  AST 18 17  ALT 14 18  ALKPHOS 95 81  BILITOT 2.1* 0.8  PROT 7.3 6.4*  ALBUMIN 2.9* 2.4*   No results for input(s): LIPASE, AMYLASE in the last 168 hours. No results for input(s): AMMONIA in the last 168 hours. Coagulation Profile: Recent Labs  Lab 03/06/17 0311 03/07/17 0231 03/08/17 0308 03/09/17 0323 03/10/17 0259  INR 5.82* 2.37 2.24 3.13 2.82   Cardiac Enzymes: No results for input(s): CKTOTAL, CKMB, CKMBINDEX, TROPONINI in the last 168 hours. BNP (last 3 results) No results for  input(s): PROBNP in the last 8760 hours. HbA1C: No results for input(s): HGBA1C in the last 72 hours. CBG: No results for input(s): GLUCAP in the last 168 hours. Lipid Profile: No results for input(s): CHOL, HDL, LDLCALC, TRIG, CHOLHDL, LDLDIRECT in the last 72 hours. Thyroid Function Tests: No results for input(s): TSH, T4TOTAL, FREET4, T3FREE, THYROIDAB in the last 72 hours. Anemia Panel: No results for input(s): VITAMINB12, FOLATE, FERRITIN, TIBC, IRON, RETICCTPCT in the last 72 hours. Sepsis Labs: Recent Labs  Lab 03/04/17 1018 03/04/17 1238 03/04/17 1415 03/04/17 1644  LATICACIDVEN 1.85 2.87* 1.4 1.4    Recent Results (from the past 240 hour(s))  Culture, blood (Routine x 2)     Status: None   Collection Time: 03/04/17 10:10 AM  Result Value Ref Range Status   Specimen Description BLOOD RIGHT ANTECUBITAL  Final   Special Requests   Final    BOTTLES DRAWN AEROBIC  AND ANAEROBIC Blood Culture adequate volume   Culture NO GROWTH 5 DAYS  Final   Report Status 03/09/2017 FINAL  Final  Culture, blood (Routine x 2)     Status: None   Collection Time: 03/04/17 12:20 PM  Result Value Ref Range Status   Specimen Description BLOOD RIGHT HAND  Final   Special Requests   Final    BOTTLES DRAWN AEROBIC ONLY Blood Culture adequate volume   Culture NO GROWTH 5 DAYS  Final   Report Status 03/09/2017 FINAL  Final  Aerobic/Anaerobic Culture (surgical/deep wound)     Status: None (Preliminary result)   Collection Time: 03/05/17 12:44 PM  Result Value Ref Range Status   Specimen Description LEG RIGHT  Final   Special Requests NONE  Final   Gram Stain   Final    MODERATE WBC PRESENT, PREDOMINANTLY PMN RARE GRAM POSITIVE COCCI IN PAIRS    Culture   Final    MODERATE METHICILLIN RESISTANT STAPHYLOCOCCUS AUREUS NO ANAEROBES ISOLATED; CULTURE IN PROGRESS FOR 5 DAYS    Report Status PENDING  Incomplete   Organism ID, Bacteria METHICILLIN RESISTANT STAPHYLOCOCCUS AUREUS  Final       Susceptibility   Methicillin resistant staphylococcus aureus - MIC*    CIPROFLOXACIN >=8 RESISTANT Resistant     ERYTHROMYCIN >=8 RESISTANT Resistant     GENTAMICIN <=0.5 SENSITIVE Sensitive     OXACILLIN >=4 RESISTANT Resistant     TETRACYCLINE <=1 SENSITIVE Sensitive     VANCOMYCIN 1 SENSITIVE Sensitive     TRIMETH/SULFA <=10 SENSITIVE Sensitive     CLINDAMYCIN >=8 RESISTANT Resistant     RIFAMPIN <=0.5 SENSITIVE Sensitive     Inducible Clindamycin NEGATIVE Sensitive     * MODERATE METHICILLIN RESISTANT STAPHYLOCOCCUS AUREUS     Radiology Studies: No results found.   Scheduled Meds: . aspirin EC  81 mg Oral Daily  . cholecalciferol  1,000 Units Oral Daily  . diltiazem  300 mg Oral Daily  . furosemide  40 mg Oral Daily  . metoprolol tartrate  12.5 mg Oral BID  . warfarin  9 mg Oral Once  . Warfarin - Pharmacist Dosing Inpatient   Does not apply q1800   Continuous Infusions: . argatroban 0.35 mcg/kg/min (03/10/17 0855)  . vancomycin Stopped (03/10/17 0236)     LOS: 5 days    Time spent: Total of 15  minutes spent with pt, greater than 50% of which was spent in discussion of  treatment, counseling and coordination of care.  Chipper Oman, MD Pager: Text Page via [email or web address]   If 7PM-7AM, please contact night-coverage [email or web address] 03/10/2017, 9:48 AM

## 2017-03-10 NOTE — Consult Note (Signed)
[facility] Nurse wound follow-up consult note Reason for Consult: Vac dressing change Wound type: full thickness post-op wound to right calf Wound bed: beefy red granulation tissue with small piece of tendon showing at bed of deep wound. Drainage (amount, consistency, odor) mod amt pink drainage in the cannister, no odor Periwound:intact Dressing procedure/placement/frequency: Removed 2 pieces black sponge.  Wound is decreasing in size and depth.  Applied Mepitel contact layer and one piece black foam to 161mm cont suction. Pt tolerated with minimal amt discomfort. Plan for bedside nurses to change Q M/W/F. Please re-consult if further assistance is needed.  Thank-you,  Julien Girt MSN, [place], [place], [place], [place]

## 2017-03-10 NOTE — Progress Notes (Signed)
Physical Therapy Treatment Patient Details Name: [name] MRN: [id number] DOB: Jul 05, 1945 Today's Date: 03/10/2017    History of Present Illness Patient is a 71 y/o female admitted on 03/04/17 due to 2 day history of R LE swelling s/p embelectomy 2 weeks prior. Dx with large abscess in R below-knee popliteal space s/p I and D on 03/05/17. Patient with a PMHx significant for A. fib on Coumadin and prior embolic events, stroke, hypertension, recent popliteal artery embolism causing ischemia right leg in the setting of subtherapeutic INR and lupus anticoagulant.    PT Comments    Pt performed gait and stair training, remains limited due to pain but overall she is progressing well.  Pt reports she will walk again this evening.     Follow Up Recommendations  Supervision for mobility/OOB     Equipment Recommendations       Recommendations for Other Services       Precautions / Restrictions Precautions Precautions: Fall Precaution Comments: Wound Vac Restrictions Weight Bearing Restrictions: No    Mobility  Bed Mobility               General bed mobility comments: Pt in recliner chair on arrival.    Transfers Overall transfer level: Needs assistance Equipment used: Rolling walker (2 wheeled);None Transfers: Sit to/from Stand Sit to Stand: Min guard         General transfer comment: Cues for hand placement to and from seated surface.  Pt performed sit to stand and stand pivot from recliner<>BSC without AD.  3rd sit to stand performed with RW.  Pt slightly unsteady without AD and definitely benefits from use at this time.    Ambulation/Gait Ambulation/Gait assistance: Min guard Ambulation Distance (Feet): 180 Feet Assistive device: Rolling walker (2 wheeled) Gait Pattern/deviations: Step-through pattern;Decreased stride length;Antalgic Gait velocity: Decreased Gait velocity interpretation: Below normal speed for age/gender General Gait Details: pt with mildly  antalgic gait secondary to pain.    Stairs Stairs: Yes   Stair Management: Two rails;Step to pattern;Forwards Number of Stairs: 4 General stair comments: Cues for sequencing and hand placement.  Pt slow to descend due to pain.  Min guard for safety  Wheelchair Mobility    Modified Rankin (Stroke Patients Only)       Balance Overall balance assessment: Needs assistance   Sitting balance-Leahy Scale: Good       Standing balance-Leahy Scale: Fair                              Cognition Arousal/Alertness: Awake/alert Behavior During Therapy: WFL for tasks assessed/performed Overall Cognitive Status: Within Functional Limits for tasks assessed                                        Exercises      General Comments        Pertinent Vitals/Pain Pain Assessment: 0-10 Pain Score: 6  Pain Location: Pain at wound vac site Pain Descriptors / Indicators: Sore Pain Intervention(s): Monitored during session;Repositioned    Home Living                      Prior Function            PT Goals (current goals can now be found in the care plan section) Acute Rehab PT Goals Patient Stated Goal:  Return home Potential to Achieve Goals: Good Progress towards PT goals: Progressing toward goals    Frequency    Min 3X/week      PT Plan Current plan remains appropriate    Co-evaluation              AM-PAC PT "6 Clicks" Daily Activity  Outcome Measure  Difficulty turning over in bed (including adjusting bedclothes, sheets and blankets)?: Unable Difficulty moving from lying on back to sitting on the side of the bed? : Unable Difficulty sitting down on and standing up from a chair with arms (e.g., wheelchair, bedside commode, etc,.)?: Unable Help needed moving to and from a bed to chair (including a wheelchair)?: A Little Help needed walking in hospital room?: A Little Help needed climbing 3-5 steps with a railing? : A Little 6  Click Score: 12    End of Session Equipment Utilized During Treatment: Gait belt Activity Tolerance: Patient tolerated treatment well Patient left: in chair;with call bell/phone within reach Nurse Communication: Mobility status PT Visit Diagnosis: Other abnormalities of gait and mobility (R26.89);Muscle weakness (generalized) (M62.81);Difficulty in walking, not elsewhere classified (R26.2)     Time: 6962-9528 PT Time Calculation (min) (ACUTE ONLY): 20 min  Charges:  $Gait Training: 8-22 mins                    G Codes:       [name], PTA pager [phone]    [name] 03/10/2017, 2:03 PM

## 2017-03-10 NOTE — Care Management Important Message (Signed)
Important Message  Patient Details  Name: [name] MRN: [id number] Date of Birth: 02-Dec-1945   Medicare Important Message Given:  Yes    Dawayne Patricia, RN 03/10/2017, 3:52 PM

## 2017-03-11 LAB — PROTIME-INR
INR: 3.95
PROTHROMBIN TIME: 38.3 s — AB (ref 11.4–15.2)

## 2017-03-11 LAB — CBC
HEMATOCRIT: 37.7 % (ref 36.0–46.0)
Hemoglobin: 12.2 g/dL (ref 12.0–15.0)
MCH: 32.3 pg (ref 26.0–34.0)
MCHC: 32.4 g/dL (ref 30.0–36.0)
MCV: 99.7 fL (ref 78.0–100.0)
PLATELETS: 196 10*3/uL (ref 150–400)
RBC: 3.78 MIL/uL — ABNORMAL LOW (ref 3.87–5.11)
RDW: 13.4 % (ref 11.5–15.5)
WBC: 11.3 10*3/uL — AB (ref 4.0–10.5)

## 2017-03-11 LAB — APTT: aPTT: 61 seconds — ABNORMAL HIGH (ref 24–36)

## 2017-03-11 MED ORDER — DOXYCYCLINE HYCLATE 100 MG PO TABS
100.0000 mg | ORAL_TABLET | Freq: Two times a day (BID) | ORAL | Status: DC
  Administered 2017-03-11 – 2017-03-12 (×2): 100 mg via ORAL
  Filled 2017-03-11 (×2): qty 1

## 2017-03-11 MED ORDER — WARFARIN SODIUM 3 MG PO TABS
6.0000 mg | ORAL_TABLET | Freq: Once | ORAL | Status: AC
  Administered 2017-03-11: 6 mg via ORAL
  Filled 2017-03-11: qty 2

## 2017-03-11 NOTE — Progress Notes (Signed)
PROGRESS NOTE [facility]   [name]   QBH:[id number] DOB: 1945/06/15  DOA: 03/04/2017 PCP: Josetta Huddle, MD   Brief Narrative:  [name] is a 71 y.o.femalewith medical history significantA. fib on Coumadin and prior embolic events, stroke, hypertension, recent popliteal artery embolismcausing ischemia right legin the setting of subtherapeutic INR and lupus anticoagulant,status post embolectomy 2 weeks ago presents to the emergency department with the chief complaint of 2 day history of right leg swelling. Initial evaluation reveals tachycardia leukocytosis elevated lactic acid.Patient admitted with sepsis, and post operative wound infection. Vascular was consulted. Patient underwent  incision and drainage of abscess right below knee popliteal incision on 12-09. She is on IV antibiotics. Wound vac has been placed. Adjusting anticoagulation to therapeutic levels.   Subjective: No new complaints. Doing well   Assessment & Plan: Sepsis due to right lower extremity cellulitis with abscess at embolectomy site. - Improved  Sepsis physiology has resolved Underwent I&D of abscess right knee popliteal incision on 12/9 WBC trended down Vascular surgery recommendations appreciated Wound culture grew moderate MRSA Patient initially treated with vancomycin and Zosyn, - Zosyn d/ced 12/  Vancomycin was given from 12/8-12/15 will switch to doxy for total of 10 days  Wound VAC in place  Acute renal failure in setting of sepsis - resolved  Treated with IV fluid Treat underlying cause, creatinine back to baseline  A. fib on Coumadin On Coumadin at home, Coumadin was held due to surgical procedure, therefore patient was started on argatroban. Continue bridge warfarin, remains on argatroban. INR 3.96 , she probably would be ok to discharge if INR close to 4.5. Continue management per pharmacy   Popliteal artery embolus status post embolectomy Dr. Eden Lathe following  Chronic  diastolic heart failure - stable  No signs of fluid overload  Continue Lasix Monitor renal function  DVT prophylaxis: Coumadin and argatroban Code Status: Full code Family Communication: None at bedside Disposition Plan: Hopefully home in 24 hrs   Consultants:   Vascular surgery  Procedures:   I&D 12/9  Antimicrobials: Anti-infectives (From admission, onward)   Start     Dose/Rate Route Frequency Ordered Stop   03/11/17 2200  doxycycline (VIBRA-TABS) tablet 100 mg     100 mg Oral Every 12 hours 03/11/17 1620     03/07/17 0200  vancomycin (VANCOCIN) IVPB 750 mg/150 ml premix  Status:  Discontinued     750 mg 150 mL/hr over 60 Minutes Intravenous Every 12 hours 03/06/17 1404 03/11/17 1620   03/05/17 0100  vancomycin (VANCOCIN) IVPB 1000 mg/200 mL premix     1,000 mg 200 mL/hr over 60 Minutes Intravenous Every 12 hours 03/04/17 1242 03/06/17 1447   03/04/17 2000  piperacillin-tazobactam (ZOSYN) IVPB 3.375 g  Status:  Discontinued     3.375 g 12.5 mL/hr over 240 Minutes Intravenous Every 8 hours 03/04/17 1411 03/08/17 1944   03/04/17 1415  piperacillin-tazobactam (ZOSYN) IVPB 3.375 g     3.375 g 100 mL/hr over 30 Minutes Intravenous  Once 03/04/17 1400 03/04/17 1618   03/04/17 1415  vancomycin (VANCOCIN) IVPB 1000 mg/200 mL premix  Status:  Discontinued     1,000 mg 200 mL/hr over 60 Minutes Intravenous  Once 03/04/17 1400 03/04/17 1421   03/04/17 1300  vancomycin (VANCOCIN) 2,000 mg in sodium chloride 0.9 % 500 mL IVPB     2,000 mg 250 mL/hr over 120 Minutes Intravenous  Once 03/04/17 1242 03/04/17 1618        Objective: Vitals:  03/10/17 1945 03/10/17 2215 03/11/17 0601 03/11/17 1217  BP: (!) 153/85  132/63 129/76  Pulse: (!) 54 71 (!) 58 70  Resp: 16  16 17   Temp: 98 F (36.7 C)  97.7 F (36.5 C) (!) 97.5 F (36.4 C)  TempSrc: Oral  Oral Oral  SpO2: 96%  96% 97%  Weight:   123.3 kg (271 lb 14.4 oz)   Height:        Intake/Output Summary (Last 24 hours) at  03/11/2017 1720 Last data filed at 03/11/2017 1500 Gross per 24 hour  Intake 240 ml  Output 600 ml  Net -360 ml   Filed Weights   03/09/17 0457 03/10/17 0343 03/11/17 0601  Weight: 128.7 kg (283 lb 11.7 oz) 124.3 kg (274 lb 0.5 oz) 123.3 kg (271 lb 14.4 oz)    Examination: - No changes in exam from 12/14  General: NAD  Cardiovascular: RRR, S1/S2 +, no rubs, no gallops Respiratory: CTA bilaterally, no wheezing, no rhonchi Abdominal: Soft, NT, ND, bowel sounds + Extremities: Wound vac in place, drianing serosanguineous material.   Data Reviewed: I have personally reviewed following labs and imaging studies  CBC: Recent Labs  Lab 03/05/17 0306 03/06/17 0311 03/07/17 0231 03/08/17 0308 03/11/17 0258  WBC 18.3* 12.9* 15.9* 11.5* 11.3*  HGB 12.1 11.6* 11.7* 11.4* 12.2  HCT 37.4 35.5* 36.0 35.6* 37.7  MCV 101.4* 100.0 100.0 99.7 99.7  PLT 164 162 168 182 527   Basic Metabolic Panel: Recent Labs  Lab 03/05/17 0306 03/06/17 0311 03/07/17 1007 03/08/17 0308 03/09/17 0323  NA 136 136 137 138 138  K 3.9 3.8 3.6 3.7 3.5  CL 101 102 104 104 101  CO2 27 27 26 28 30   GLUCOSE 127* 227* 149* 147* 118*  BUN 11 14 21* 20 16  CREATININE 1.10* 1.02* 0.99 1.10* 1.00  CALCIUM 8.0* 8.3* 8.5* 8.2* 8.2*   GFR: Estimated Creatinine Clearance: 69.2 mL/min (by C-G formula based on SCr of 1 mg/dL). Liver Function Tests: Recent Labs  Lab 03/08/17 0308  AST 17  ALT 18  ALKPHOS 81  BILITOT 0.8  PROT 6.4*  ALBUMIN 2.4*   No results for input(s): LIPASE, AMYLASE in the last 168 hours. No results for input(s): AMMONIA in the last 168 hours. Coagulation Profile: Recent Labs  Lab 03/07/17 0231 03/08/17 0308 03/09/17 0323 03/10/17 0259 03/11/17 0258  INR 2.37 2.24 3.13 2.82 3.95   Cardiac Enzymes: No results for input(s): CKTOTAL, CKMB, CKMBINDEX, TROPONINI in the last 168 hours. BNP (last 3 results) No results for input(s): PROBNP in the last 8760 hours. HbA1C: No results  for input(s): HGBA1C in the last 72 hours. CBG: No results for input(s): GLUCAP in the last 168 hours. Lipid Profile: No results for input(s): CHOL, HDL, LDLCALC, TRIG, CHOLHDL, LDLDIRECT in the last 72 hours. Thyroid Function Tests: No results for input(s): TSH, T4TOTAL, FREET4, T3FREE, THYROIDAB in the last 72 hours. Anemia Panel: No results for input(s): VITAMINB12, FOLATE, FERRITIN, TIBC, IRON, RETICCTPCT in the last 72 hours. Sepsis Labs: No results for input(s): PROCALCITON, LATICACIDVEN in the last 168 hours.  Recent Results (from the past 240 hour(s))  Culture, blood (Routine x 2)     Status: None   Collection Time: 03/04/17 10:10 AM  Result Value Ref Range Status   Specimen Description BLOOD RIGHT ANTECUBITAL  Final   Special Requests   Final    BOTTLES DRAWN AEROBIC AND ANAEROBIC Blood Culture adequate volume   Culture NO GROWTH 5 DAYS  Final   Report Status 03/09/2017 FINAL  Final  Culture, blood (Routine x 2)     Status: None   Collection Time: 03/04/17 12:20 PM  Result Value Ref Range Status   Specimen Description BLOOD RIGHT HAND  Final   Special Requests   Final    BOTTLES DRAWN AEROBIC ONLY Blood Culture adequate volume   Culture NO GROWTH 5 DAYS  Final   Report Status 03/09/2017 FINAL  Final  Aerobic/Anaerobic Culture (surgical/deep wound)     Status: None   Collection Time: 03/05/17 12:44 PM  Result Value Ref Range Status   Specimen Description LEG RIGHT  Final   Special Requests NONE  Final   Gram Stain   Final    MODERATE WBC PRESENT, PREDOMINANTLY PMN RARE GRAM POSITIVE COCCI IN PAIRS    Culture   Final    MODERATE METHICILLIN RESISTANT STAPHYLOCOCCUS AUREUS NO ANAEROBES ISOLATED    Report Status 03/10/2017 FINAL  Final   Organism ID, Bacteria METHICILLIN RESISTANT STAPHYLOCOCCUS AUREUS  Final      Susceptibility   Methicillin resistant staphylococcus aureus - MIC*    CIPROFLOXACIN >=8 RESISTANT Resistant     ERYTHROMYCIN >=8 RESISTANT Resistant      GENTAMICIN <=0.5 SENSITIVE Sensitive     OXACILLIN >=4 RESISTANT Resistant     TETRACYCLINE <=1 SENSITIVE Sensitive     VANCOMYCIN 1 SENSITIVE Sensitive     TRIMETH/SULFA <=10 SENSITIVE Sensitive     CLINDAMYCIN >=8 RESISTANT Resistant     RIFAMPIN <=0.5 SENSITIVE Sensitive     Inducible Clindamycin NEGATIVE Sensitive     * MODERATE METHICILLIN RESISTANT STAPHYLOCOCCUS AUREUS     Radiology Studies: No results found.   Scheduled Meds: . aspirin EC  81 mg Oral Daily  . cholecalciferol  1,000 Units Oral Daily  . diltiazem  300 mg Oral Daily  . doxycycline  100 mg Oral Q12H  . furosemide  40 mg Oral Daily  . metoprolol tartrate  12.5 mg Oral BID  . warfarin  6 mg Oral Once  . Warfarin - Pharmacist Dosing Inpatient   Does not apply q1800   Continuous Infusions: . argatroban 0.35 mcg/kg/min (03/11/17 0232)     LOS: 6 days    Time spent: Total of 15  minutes spent with pt, greater than 50% of which was spent in discussion of  treatment, counseling and coordination of care.  Chipper Oman, MD Pager: Text Page via [email or web address]   If 7PM-7AM, please contact night-coverage [email or web address] 03/11/2017, 5:20 PM

## 2017-03-11 NOTE — Progress Notes (Signed)
Pt has had several pauses throughout the night longest lasting 2.79sec. Pt HR in 40's to 50's while sleeping.Will continue to monitor. Isac Caddy, RN

## 2017-03-11 NOTE — Progress Notes (Signed)
[place] for agratroban/coumadin Indication: atrial fibrillation  Allergies  Allergen Reactions  . Heparin Hives    Patient attests severe allergy- rash all over, severe itching, unable to take heparin  . Other Itching and Rash    "Sheets and Linens used by [employer]"    Patient Measurements: Height: 5\' 6"  (167.6 cm) Weight: 271 lb 14.4 oz (123.3 kg) IBW/kg (Calculated) : 59.3  Vital Signs: Temp: 97.7 F (36.5 C) (12/15 0601) Temp Source: Oral (12/15 0601) BP: 132/63 (12/15 0601) Pulse Rate: 58 (12/15 0601)  Labs: Recent Labs    03/09/17 0323 03/10/17 0259 03/11/17 0258  HGB  --   --  12.2  HCT  --   --  37.7  PLT  --   --  196  APTT 61* 57* 61*  LABPROT 31.9* 29.5* 38.3*  INR 3.13 2.82 3.95  CREATININE 1.00  --   --     Estimated Creatinine Clearance: 69.2 mL/min (by C-G formula based on SCr of 1 mg/dL).  Assessment: 40 yof is admitted on atrial fibrillation on warfarin for prior embolic events. She was found to have increased swelling and erythema right below knee incision and underwent I&D of right below-knee popliteal incision on 12/9.  She is noted s/p vitamin K 10mg  IV 12/9 and is now on argatroban due to a heparin allergy.  -aPTT= 61 on 0.35 mcg/kg/min -INR= 3.96 and up from 2.82 (goal typically 4-6 on argatroban but may need to consider at least 4.5 due to the higher goal INR as outpatient)  Home coumadin regimen: to 6 mg daily except 4.5 mg Mon. INR goal as outpatient was 2.5-3.5  Goal of Therapy:  INR goal 4-6 while on argatroban aPTT 50-90 seconds Monitor platelets by anticoagulation protocol: Yes   Plan:  Continue argatroban 0.35 mcg/kg/min Warfarin 6mg  PO x 1 today Daily INR, aPTT and CBC  Hildred Laser, Pharm D 03/11/2017 12:37 PM

## 2017-03-11 NOTE — Progress Notes (Signed)
Pharmacy Antibiotic Note  [name] is a 71 y.o. female with MRSA cellulitis. Pharmacy has been consulted for vancomycin dosing (currently day 8 of antibiotics). Plans noted for po doxycycline. -WBC= 11.5, afebrile, CrCl ~ 70  Plan: -No vancomycin dose changes needed -Consider change to po doxycycline today to complete a total of 10 days? -Will check a vancomycin level if vancomycin continues    Height: 5\' 6"  (167.6 cm) Weight: 271 lb 14.4 oz (123.3 kg) IBW/kg (Calculated) : 59.3  Temp (24hrs), Avg:98 F (36.7 C), Min:97.7 F (36.5 C), Max:98.3 F (36.8 C)  Recent Labs  Lab 03/04/17 1415 03/04/17 1644 03/05/17 0306 03/06/17 0311 03/06/17 1303 03/07/17 0231 03/07/17 1007 03/08/17 0308 03/09/17 0323 03/11/17 0258  WBC  --   --  18.3* 12.9*  --  15.9*  --  11.5*  --  11.3*  CREATININE  --   --  1.10* 1.02*  --   --  0.99 1.10* 1.00  --   LATICACIDVEN 1.4 1.4  --   --   --   --   --   --   --   --   VANCOTROUGH  --   --   --   --  18  --   --   --   --   --     Estimated Creatinine Clearance: 69.2 mL/min (by C-G formula based on SCr of 1 mg/dL).    Allergies  Allergen Reactions  . Heparin Hives    Patient attests severe allergy- rash all over, severe itching, unable to take heparin  . Other Itching and Rash    "Sheets and Linens used by [employer] Pontes"    Antimicrobials this admission: Vanc 12/8 >> Zosyn 12/8 >>12/12  Dose adjustments this admission: 12/10: VT= 18 at ~ 1pm (last dose at ~ 1: 30am); change vanc to 750mg  IV q12h  Microbiology results: 12/8BCx:NGTD 12/9 R leg cx: MRSA  Thank you for allowing pharmacy to be a part of this patient's care.  Hildred Laser, Pharm D 03/11/2017 12:41 PM

## 2017-03-12 LAB — APTT: APTT: 62 s — AB (ref 24–36)

## 2017-03-12 LAB — PROTIME-INR
INR: 4.11 — AB
PROTHROMBIN TIME: 40.3 s — AB (ref 11.4–15.2)

## 2017-03-12 MED ORDER — DOXYCYCLINE HYCLATE 100 MG PO TABS: 100.0000 mg | ORAL_TABLET | Freq: Two times a day (BID) | ORAL | 0 refills | Status: AC

## 2017-03-12 MED ORDER — WARFARIN SODIUM 5 MG PO TABS: 9.0000 mg | ORAL_TABLET | Freq: Once | ORAL | Status: DC

## 2017-03-12 MED ORDER — OXYCODONE HCL 5 MG PO TABS: 5.0000 mg | ORAL_TABLET | Freq: Four times a day (QID) | ORAL | 0 refills | Status: AC | PRN

## 2017-03-12 MED ORDER — WARFARIN SODIUM 7.5 MG PO TABS: 7.5000 mg | ORAL_TABLET | Freq: Once | ORAL | Status: DC

## 2017-03-12 NOTE — Progress Notes (Addendum)
[name] for agratroban/coumadin Indication: atrial fibrillation  Allergies  Allergen Reactions  . Heparin Hives    Patient attests severe allergy- rash all over, severe itching, unable to take heparin  . Other Itching and Rash    "Sheets and Linens used by [name]"    Patient Measurements: Height: 5\' 6"  (167.6 cm) Weight: 270 lb 6.4 oz (122.7 kg) IBW/kg (Calculated) : 59.3  Vital Signs: Temp: 97.8 F (36.6 C) (12/16 0435) Temp Source: Oral (12/16 0435) BP: 137/94 (12/16 0435) Pulse Rate: 78 (12/16 0435)  Labs: Recent Labs    03/10/17 0259 03/11/17 0258 03/12/17 0250  HGB  --  12.2  --   HCT  --  37.7  --   PLT  --  196  --   APTT 57* 61* 62*  LABPROT 29.5* 38.3* 40.3*  INR 2.82 3.95 4.11*    Estimated Creatinine Clearance: 69 mL/min (by C-G formula based on SCr of 1 mg/dL).  Assessment: 59 yof is admitted on atrial fibrillation on warfarin for prior embolic events. She was found to have increased swelling and erythema right below knee incision and underwent I&D of right below-knee popliteal incision on 12/9.  She is noted s/p vitamin K 10mg  IV 12/9 and is now on argatroban due to a heparin allergy.  -aPTT= 61 on 0.35 mcg/kg/min -INR=4.11 (goal typically 4-6 on argatroban but may need to consider at least 4.5 due to the higher goal INR as outpatient)  Home coumadin regimen: to 6 mg daily except 4.5 mg Mon. INR goal as outpatient was 2.5-3.5  Goal of Therapy:  INR goal 4-6 while on argatroban aPTT 50-90 seconds Monitor platelets by anticoagulation protocol: Yes   Plan:  Continue argatroban 0.35 mcg/kg/min Warfarin 7.5mg  PO x 1 today Daily INR, aPTT and CBC  Hildred Laser, Pharm D 03/12/2017 12:18 PM

## 2017-03-12 NOTE — Discharge Summary (Signed)
Physician Discharge Summary  [name]  ENI:[id number]  DOB: 1946-03-26  DOA: 03/04/2017 PCP: Josetta Huddle, MD  Admit date: 03/04/2017 Discharge date: 03/12/2017  Admitted From: Home  Disposition:  Home   Recommendations for Outpatient Follow-up:  1. Follow up with PCP in 1-2 weeks 2. Follow up with Vascular surgery Dr Oneida Alar in 2 weeks  3. Need to monitor INR in 2 days - goal 2-3   Home Health: RN,Aide PT/OT  Equipment/Devices: Wound Vac  Discharge Condition: Stable  CODE STATUS: Full code  Diet recommendation: Heart Healthy   Brief/Interim Summary: [name] is a 71 y.o.femalewith medical history significantA. fib on Coumadin and prior embolic events, stroke, hypertension, recent popliteal artery embolismcausing ischemia right legin the setting of subtherapeutic INR and lupus anticoagulant,status post embolectomy 2 weeks ago presents to the emergency department with the chief complaint of 2 day history of right leg swelling. Initial evaluation reveals tachycardia leukocytosis elevated lactic acid.Patient admitted with sepsis, and post operative wound infection. Vascular was consulted. Patient underwentincision and drainage of abscess right below knee popliteal incision on 12-09. She is on IV antibiotics. Wound vac has been placed. Adjusted anticoagulation with argatroban and warfarin. INR with acceptable levels.   Subjective: No complaints, continues to do well. INR up to 4.1   Discharge Diagnoses/Hospital Course:  Sepsis due to right lower extremity cellulitis with abscess at embolectomy site. - Improved  Sepsis physiology has resolved Underwent I&D of abscess right knee popliteal incision on 12/9 WBC trended down Vascular surgery recommendations appreciated Wound culture grew moderate MRSA Patient initially treated with vancomycin and Zosyn, - Zosyn d/ced 12/  Vancomycin was given from 12/8-12/15 switched to doxy for total of 10 days, end date  03/20/17 Wound VAC in place  Acute renal failure in setting of sepsis - resolved  Treated with IV fluid Treat underlying cause, creatinine back to baseline  A. fib on Coumadin On Coumadin at home, Coumadin was held due to surgical procedure, therefore patient was started on argatroban due to heparin allergies. Patient subsequently was bridge with argatroban and warfarin up to goal levels of 4-6 of INR. Patient is to resume home dose coumadin and check INR levels on Tuesday, [place] agency to check INR in 2 days   Popliteal artery embolus status post embolectomy Follow up with Dr. Eden Lathe in 1-2 weeks   Chronic diastolic heart failure - stable  No signs of fluid overload  Continue Lasix Check Cr in 1 week   All other chronic medical condition were stable during the hospitalization.  Patient was seen by physical therapy, recommending HH PT  On the day of the discharge the patient's vitals were stable, and no other acute medical condition were reported by patient. the patient was felt safe to be discharge to home   Discharge Instructions  You were cared for by a hospitalist during your hospital stay. If you have any questions about your discharge medications or the care you received while you were in the hospital after you are discharged, you can call the unit and asked to speak with the hospitalist on call if the hospitalist that took care of you is not available. Once you are discharged, your primary care physician will handle any further medical issues. Please note that NO REFILLS for any discharge medications will be authorized once you are discharged, as it is imperative that you return to your primary care physician (or establish a relationship with a primary care physician if you do not have one)  for your aftercare needs so that they can reassess your need for medications and monitor your lab values.  Discharge Instructions    Call MD for:  difficulty breathing, headache or visual  disturbances   Complete by:  As directed    Call MD for:  extreme fatigue   Complete by:  As directed    Call MD for:  hives   Complete by:  As directed    Call MD for:  persistant dizziness or light-headedness   Complete by:  As directed    Call MD for:  persistant nausea and vomiting   Complete by:  As directed    Call MD for:  redness, tenderness, or signs of infection (pain, swelling, redness, odor or green/yellow discharge around incision site)   Complete by:  As directed    Call MD for:  severe uncontrolled pain   Complete by:  As directed    Call MD for:  temperature >100.4   Complete by:  As directed    Diet - low sodium heart healthy   Complete by:  As directed    Increase activity slowly   Complete by:  As directed      Allergies as of 03/12/2017      Reactions   Heparin Hives   Patient attests severe allergy- rash all over, severe itching, unable to take heparin   Other Itching, Rash   "Sheets and Linens used by [employer]es"      Medication List    TAKE these medications   acetaminophen 500 MG tablet Commonly known as:  TYLENOL Take 1,000 mg by mouth every 6 (six) hours as needed for mild pain.   aspirin 81 MG tablet Take 81 mg by mouth daily.   CARTIA XT 300 MG 24 hr capsule Generic drug:  diltiazem TAKE 1 CAPSULE BY MOUTH ONCE DAILY   cholecalciferol 1000 units tablet Commonly known as:  VITAMIN D Take 1,000 Units by mouth daily.   doxycycline 100 MG tablet Commonly known as:  VIBRA-TABS Take 1 tablet (100 mg total) by mouth every 12 (twelve) hours for 8 days.   furosemide 40 MG tablet Commonly known as:  LASIX Take 1 tablet (40 mg total) by mouth daily. TAKE ONE TABLET BY MOUTH ONCE DAILY. NEED OFFICE VISIT FOR ADDITIONAL REFILLS.   metoprolol tartrate 25 MG tablet Commonly known as:  LOPRESSOR Take 0.5 tablets (12.5 mg total) by mouth 2 (two) times daily. KEEP OV.   oxyCODONE 5 MG immediate release tablet Commonly known as:  Oxy  IR/ROXICODONE Take 1 tablet (5 mg total) by mouth every 6 (six) hours as needed for up to 3 days for severe pain.   warfarin 3 MG tablet Commonly known as:  COUMADIN Take as directed. If you are unsure how to take this medication, talk to your nurse or doctor. Original instructions:  Take 6 mg by mouth 2 (two) times daily. - needs INR and MD appt for further refills      Follow-up Information    Josetta Huddle, MD Follow up.   Specialty:  Internal Medicine Contact information: [address] [place] [address] [phone]        [facility] - Dme Follow up.   Why:  Home wound arranged- [facility] RN to come to bedside to place home vac on pt prior to discharge) Contact information: [address] [place] [address] [phone]        [facility]  Follow up.   Specialty:  Home Health Services Contact information: [address] [place] [address] [phone]        Elam Dutch, MD. Schedule an appointment as soon as possible for a visit in 2 week(s).   Specialties:  Vascular Surgery, Cardiology Why:  Hospital follow up  Contact information: [address] [place] [address] [phone]          Allergies  Allergen Reactions  . Heparin Hives    Patient attests severe allergy- rash all over, severe itching, unable to take heparin  . Other Itching and Rash    "Sheets and Linens used by [employer]ac[employer] Pontes"    Consultations:  Vascular surgery - Dr Eden Lathe    Procedures/Studies: Ct Angio Low Extrem Right W &/or Wo Contrast  Result Date: 02/15/2017 CLINICAL DATA:  Arterial embolism of the right lower extremity. Right lower extremity numbness onset this morning. EXAM: CT ANGIOGRAPHY LOWER RIGHT EXTREMITY<Procedure Description>CT ANGIOGRAPHY LOWER RIGHT EXTREMITY TECHNIQUE: CTA of the right lower extremity from distal abdominal aorta through the ankle sagittal coronal reformats are provided of the  right lower extremity. CONTRAST:  100 cc of Isovue 370 IV COMPARISON:  None. FINDINGS: No abdominal aortic aneurysm or dissection of the included mid to distal abdominal aorta. An adjacent right-sided IVC filter is present. Rounded hypodensity adjacent to the included right kidney may represent partial volume averaging of a renal cyst or potentially the gallbladder. This measures 4 cm in diameter. There is scattered colonic diverticulosis without acute diverticulitis. Urinary bladder is unremarkable. The uterus and included right adnexa is normal. No significant stenosis or dissection is noted of the right common, internal and external iliac arteries. The common femoral artery is patent without significant stenosis as are the branch vessels. There is however abrupt termination of the popliteal artery at the level of the knee joint with reconstitution of the tibial arteries via thin sural collaterals. Reconstitution of the tibioperoneal trunk is demonstrated with proximal short-segment occlusion of the posterior tibial artery. The dominant runoff to the ankle is to the anterior tibial artery which can be seen to the level of the ankle. The peroneal artery can be visualized to the distal calf and reconstituted posterior tibial artery to the ankle. Review of the MIP images confirms the above findings. IMPRESSION: 1. Occlusion of the popliteal artery at the level of the knee joint for a span of 5 cm believed to be embolic in etiology with short segmental occlusion of the proximal posterior tibial artery within the calf. These results were called by telephone at the time of interpretation on 02/15/2017 at 7:15 pm to Dr. Hazle Coca, who verbally acknowledged these results. 2. Reconstitution of the tibial arteries via sural collaterals with dominant runoff via the anterior tibial artery. Electronically Signed   By: Ashley Royalty M.D.   On: 02/15/2017 19:16   Dg Chest Port 1 View  Result Date: 03/04/2017 CLINICAL DATA:   Blood clot RIGHT leg removed after Thanksgiving, now with swelling and redness at site, sepsis, history of heart failure, pulmonary embolism, DVT, hypertension EXAM: PORTABLE CHEST 1 VIEW COMPARISON:  Portable exam 1241 hours compared to 06/06/2010 FINDINGS: Enlargement of cardiac silhouette. Mediastinal contours and pulmonary vascularity normal. Lungs clear. No pleural effusion or pneumothorax. Bones demineralized. IMPRESSION: Enlargement of cardiac silhouette. No acute abnormalities. Electronically Signed   By: Lavonia Dana M.D.   On: 03/04/2017 13:15   Ct Head Code Stroke Wo Contrast  Result Date: 02/15/2017 CLINICAL DATA:  Code  stroke. 71 year old female with right lower extremity numbness at 1100 hours. EXAM: CT HEAD WITHOUT CONTRAST TECHNIQUE: Contiguous axial images were obtained from the base of the skull through the vertex without intravenous contrast. COMPARISON:  Head CT without contrast 06/11/2010 and earlier. FINDINGS: Brain: Sequelae of hemorrhagic right basal ganglia infarct that occurred in 2012. Chronic encephalomalacia now at that site. Mild ex vacuo enlargement of the right lateral ventricle. Small chronic appearing infarct in the right cerebellar hemisphere. Mild motion artifact. No midline shift, ventriculomegaly, mass effect, evidence of mass lesion, intracranial hemorrhage or evidence of cortically based acute infarction. Vascular: Calcified atherosclerosis at the skull base. No suspicious intracranial vascular hyperdensity. Skull: No acute osseous abnormality identified. Sinuses/Orbits: stable to improved sinus aeration since 2012. Mastoids are clear. Other: No acute orbit or scalp soft tissue finding. ASPECTS [facility] Stroke Program Early CT Score) - Ganglionic level infarction (caudate, lentiform nuclei, internal capsule, insula, M1-M3 cortex): 7 - Supraganglionic infarction (M4-M6 cortex): 3 Total score (0-10 with 10 being normal): 10 IMPRESSION: 1. No acute cortically based infarct or  acute intracranial hemorrhage. 2. ASPECTS is 10. 3. Chronic hemorrhagic infarct of the right basal ganglia. Small chronic lacune in the right cerebellum. 4. The above was relayed via text pager to Dr. Bruce Donath on 02/15/2017 at 14:01 . Electronically Signed   By: Genevie Ann M.D.   On: 02/15/2017 14:01    Discharge Exam: Vitals:   03/11/17 1947 03/12/17 0435  BP: (!) 158/81 (!) 137/94  Pulse: 67 78  Resp: 18 18  Temp: (!) 97.3 F (36.3 C) 97.8 F (36.6 C)  SpO2: 98% 96%   Vitals:   03/11/17 0601 03/11/17 1217 03/11/17 1947 03/12/17 0435  BP: 132/63 129/76 (!) 158/81 (!) 137/94  Pulse: (!) 58 70 67 78  Resp: 16 17 18 18   Temp: 97.7 F (36.5 C) (!) 97.5 F (36.4 C) (!) 97.3 F (36.3 C) 97.8 F (36.6 C)  TempSrc: Oral Oral Oral Oral  SpO2: 96% 97% 98% 96%  Weight: 123.3 kg (271 lb 14.4 oz)   122.7 kg (270 lb 6.4 oz)  Height:        General: Pt is alert, awake, not in acute distress Cardiovascular: RRR, S1/S2 +, no rubs, no gallops Respiratory: CTA bilaterally, no wheezing, no rhonchi Abdominal: Soft, NT, ND, bowel sounds + Extremities: R leg wound vac    The results of significant diagnostics from this hospitalization (including imaging, microbiology, ancillary and laboratory) are listed below for reference.     Microbiology: Recent Results (from the past 240 hour(s))  Culture, blood (Routine x 2)     Status: None   Collection Time: 03/04/17 10:10 AM  Result Value Ref Range Status   Specimen Description BLOOD RIGHT ANTECUBITAL  Final   Special Requests   Final    BOTTLES DRAWN AEROBIC AND ANAEROBIC Blood Culture adequate volume   Culture NO GROWTH 5 DAYS  Final   Report Status 03/09/2017 FINAL  Final  Culture, blood (Routine x 2)     Status: None   Collection Time: 03/04/17 12:20 PM  Result Value Ref Range Status   Specimen Description BLOOD RIGHT HAND  Final   Special Requests   Final    BOTTLES DRAWN AEROBIC ONLY Blood Culture adequate volume   Culture NO GROWTH 5  DAYS  Final   Report Status 03/09/2017 FINAL  Final  Aerobic/Anaerobic Culture (surgical/deep wound)     Status: None   Collection Time: 03/05/17 12:44 PM  Result Value  Ref Range Status   Specimen Description LEG RIGHT  Final   Special Requests NONE  Final   Gram Stain   Final    MODERATE WBC PRESENT, PREDOMINANTLY PMN RARE GRAM POSITIVE COCCI IN PAIRS    Culture   Final    MODERATE METHICILLIN RESISTANT STAPHYLOCOCCUS AUREUS NO ANAEROBES ISOLATED    Report Status 03/10/2017 FINAL  Final   Organism ID, Bacteria METHICILLIN RESISTANT STAPHYLOCOCCUS AUREUS  Final      Susceptibility   Methicillin resistant staphylococcus aureus - MIC*    CIPROFLOXACIN >=8 RESISTANT Resistant     ERYTHROMYCIN >=8 RESISTANT Resistant     GENTAMICIN <=0.5 SENSITIVE Sensitive     OXACILLIN >=4 RESISTANT Resistant     TETRACYCLINE <=1 SENSITIVE Sensitive     VANCOMYCIN 1 SENSITIVE Sensitive     TRIMETH/SULFA <=10 SENSITIVE Sensitive     CLINDAMYCIN >=8 RESISTANT Resistant     RIFAMPIN <=0.5 SENSITIVE Sensitive     Inducible Clindamycin NEGATIVE Sensitive     * MODERATE METHICILLIN RESISTANT STAPHYLOCOCCUS AUREUS     Labs: BNP (last 3 results) No results for input(s): BNP in the last 8760 hours. Basic Metabolic Panel: Recent Labs  Lab 03/06/17 0311 03/07/17 1007 03/08/17 0308 03/09/17 0323  NA 136 137 138 138  K 3.8 3.6 3.7 3.5  CL 102 104 104 101  CO2 27 26 28 30   GLUCOSE 227* 149* 147* 118*  BUN 14 21* 20 16  CREATININE 1.02* 0.99 1.10* 1.00  CALCIUM 8.3* 8.5* 8.2* 8.2*   Liver Function Tests: Recent Labs  Lab 03/08/17 0308  AST 17  ALT 18  ALKPHOS 81  BILITOT 0.8  PROT 6.4*  ALBUMIN 2.4*   No results for input(s): LIPASE, AMYLASE in the last 168 hours. No results for input(s): AMMONIA in the last 168 hours. CBC: Recent Labs  Lab 03/06/17 0311 03/07/17 0231 03/08/17 0308 03/11/17 0258  WBC 12.9* 15.9* 11.5* 11.3*  HGB 11.6* 11.7* 11.4* 12.2  HCT 35.5* 36.0 35.6* 37.7   MCV 100.0 100.0 99.7 99.7  PLT 162 168 182 196   Cardiac Enzymes: No results for input(s): CKTOTAL, CKMB, CKMBINDEX, TROPONINI in the last 168 hours. BNP: Invalid input(s): POCBNP CBG: No results for input(s): GLUCAP in the last 168 hours. D-Dimer No results for input(s): DDIMER in the last 72 hours. Hgb A1c No results for input(s): HGBA1C in the last 72 hours. Lipid Profile No results for input(s): CHOL, HDL, LDLCALC, TRIG, CHOLHDL, LDLDIRECT in the last 72 hours. Thyroid function studies No results for input(s): TSH, T4TOTAL, T3FREE, THYROIDAB in the last 72 hours.  Invalid input(s): FREET3 Anemia work up No results for input(s): VITAMINB12, FOLATE, FERRITIN, TIBC, IRON, RETICCTPCT in the last 72 hours. Urinalysis    Component Value Date/Time   COLORURINE AMBER (A) 03/05/2017 0251   APPEARANCEUR HAZY (A) 03/05/2017 0251   LABSPEC 1.027 03/05/2017 0251   PHURINE 6.0 03/05/2017 0251   GLUCOSEU NEGATIVE 03/05/2017 0251   HGBUR MODERATE (A) 03/05/2017 0251   BILIRUBINUR NEGATIVE 03/05/2017 0251   KETONESUR NEGATIVE 03/05/2017 0251   PROTEINUR 100 (A) 03/05/2017 0251   UROBILINOGEN 1.0 07/07/2010 1500   NITRITE POSITIVE (A) 03/05/2017 0251   LEUKOCYTESUR NEGATIVE 03/05/2017 0251   Sepsis Labs Invalid input(s): PROCALCITONIN,  WBC,  LACTICIDVEN Microbiology Recent Results (from the past 240 hour(s))  Culture, blood (Routine x 2)     Status: None   Collection Time: 03/04/17 10:10 AM  Result Value Ref Range Status   Specimen Description  BLOOD RIGHT ANTECUBITAL  Final   Special Requests   Final    BOTTLES DRAWN AEROBIC AND ANAEROBIC Blood Culture adequate volume   Culture NO GROWTH 5 DAYS  Final   Report Status 03/09/2017 FINAL  Final  Culture, blood (Routine x 2)     Status: None   Collection Time: 03/04/17 12:20 PM  Result Value Ref Range Status   Specimen Description BLOOD RIGHT HAND  Final   Special Requests   Final    BOTTLES DRAWN AEROBIC ONLY Blood Culture  adequate volume   Culture NO GROWTH 5 DAYS  Final   Report Status 03/09/2017 FINAL  Final  Aerobic/Anaerobic Culture (surgical/deep wound)     Status: None   Collection Time: 03/05/17 12:44 PM  Result Value Ref Range Status   Specimen Description LEG RIGHT  Final   Special Requests NONE  Final   Gram Stain   Final    MODERATE WBC PRESENT, PREDOMINANTLY PMN RARE GRAM POSITIVE COCCI IN PAIRS    Culture   Final    MODERATE METHICILLIN RESISTANT STAPHYLOCOCCUS AUREUS NO ANAEROBES ISOLATED    Report Status 03/10/2017 FINAL  Final   Organism ID, Bacteria METHICILLIN RESISTANT STAPHYLOCOCCUS AUREUS  Final      Susceptibility   Methicillin resistant staphylococcus aureus - MIC*    CIPROFLOXACIN >=8 RESISTANT Resistant     ERYTHROMYCIN >=8 RESISTANT Resistant     GENTAMICIN <=0.5 SENSITIVE Sensitive     OXACILLIN >=4 RESISTANT Resistant     TETRACYCLINE <=1 SENSITIVE Sensitive     VANCOMYCIN 1 SENSITIVE Sensitive     TRIMETH/SULFA <=10 SENSITIVE Sensitive     CLINDAMYCIN >=8 RESISTANT Resistant     RIFAMPIN <=0.5 SENSITIVE Sensitive     Inducible Clindamycin NEGATIVE Sensitive     * MODERATE METHICILLIN RESISTANT STAPHYLOCOCCUS AUREUS     Time coordinating discharge: 32 minutes  SIGNED:  Chipper Oman, MD  [facility] 03/12/2017, 3:09 PM  Pager please text page via  [email or web address] Password [email or web address]

## 2017-03-13 DIAGNOSIS — Z7901 Long term (current) use of anticoagulants: Secondary | ICD-10-CM | POA: Diagnosis not present

## 2017-03-13 DIAGNOSIS — E669 Obesity, unspecified: Secondary | ICD-10-CM | POA: Diagnosis not present

## 2017-03-13 DIAGNOSIS — T8141XD Infection following a procedure, superficial incisional surgical site, subsequent encounter: Secondary | ICD-10-CM | POA: Diagnosis not present

## 2017-03-13 DIAGNOSIS — Z7982 Long term (current) use of aspirin: Secondary | ICD-10-CM | POA: Diagnosis not present

## 2017-03-13 DIAGNOSIS — L03115 Cellulitis of right lower limb: Secondary | ICD-10-CM | POA: Diagnosis not present

## 2017-03-13 DIAGNOSIS — Z5181 Encounter for therapeutic drug level monitoring: Secondary | ICD-10-CM | POA: Diagnosis not present

## 2017-03-13 DIAGNOSIS — F329 Major depressive disorder, single episode, unspecified: Secondary | ICD-10-CM | POA: Diagnosis not present

## 2017-03-13 DIAGNOSIS — Z86718 Personal history of other venous thrombosis and embolism: Secondary | ICD-10-CM | POA: Diagnosis not present

## 2017-03-13 DIAGNOSIS — D7582 Heparin induced thrombocytopenia (HIT): Secondary | ICD-10-CM | POA: Diagnosis not present

## 2017-03-13 DIAGNOSIS — I5030 Unspecified diastolic (congestive) heart failure: Secondary | ICD-10-CM | POA: Diagnosis not present

## 2017-03-13 DIAGNOSIS — L02415 Cutaneous abscess of right lower limb: Secondary | ICD-10-CM | POA: Diagnosis not present

## 2017-03-13 DIAGNOSIS — Z8673 Personal history of transient ischemic attack (TIA), and cerebral infarction without residual deficits: Secondary | ICD-10-CM | POA: Diagnosis not present

## 2017-03-13 DIAGNOSIS — I110 Hypertensive heart disease with heart failure: Secondary | ICD-10-CM | POA: Diagnosis not present

## 2017-03-13 DIAGNOSIS — Z86711 Personal history of pulmonary embolism: Secondary | ICD-10-CM | POA: Diagnosis not present

## 2017-03-13 DIAGNOSIS — I4891 Unspecified atrial fibrillation: Secondary | ICD-10-CM | POA: Diagnosis not present

## 2017-03-13 DIAGNOSIS — D6862 Lupus anticoagulant syndrome: Secondary | ICD-10-CM | POA: Diagnosis not present

## 2017-03-14 ENCOUNTER — Ambulatory Visit (INDEPENDENT_AMBULATORY_CARE_PROVIDER_SITE_OTHER): Payer: Medicare Other | Admitting: *Deleted

## 2017-03-14 DIAGNOSIS — Z5181 Encounter for therapeutic drug level monitoring: Secondary | ICD-10-CM

## 2017-03-14 DIAGNOSIS — I4891 Unspecified atrial fibrillation: Secondary | ICD-10-CM

## 2017-03-14 DIAGNOSIS — Z7901 Long term (current) use of anticoagulants: Secondary | ICD-10-CM | POA: Diagnosis not present

## 2017-03-14 DIAGNOSIS — I2699 Other pulmonary embolism without acute cor pulmonale: Secondary | ICD-10-CM

## 2017-03-14 DIAGNOSIS — I743 Embolism and thrombosis of arteries of the lower extremities: Secondary | ICD-10-CM

## 2017-03-14 LAB — POCT INR: INR: 4.5

## 2017-03-14 NOTE — Patient Instructions (Addendum)
  Description   Do not take any Coumadin today  Dec 18th and no coumadin on Dec 19th  then start taking 2 tablets daily except 1.5 tablets on Mondays and Fridays Do good serving of greens today and tomorrow then do small serving of greens each day until finishes Doxycycline  Continue eating 4 servings of dark green leafy veggies weekly.  Recheck INR in 1 week. [facility] [phone]  Will finish Doxycycline on Dec 23rd  Will call [facility] and have them check INR on Dec 26th Spoke with Legrand Como at [facility] and gave order to have INR checked on 03/22/2017

## 2017-03-15 DIAGNOSIS — I5030 Unspecified diastolic (congestive) heart failure: Secondary | ICD-10-CM | POA: Diagnosis not present

## 2017-03-15 DIAGNOSIS — L03115 Cellulitis of right lower limb: Secondary | ICD-10-CM | POA: Diagnosis not present

## 2017-03-15 DIAGNOSIS — I4891 Unspecified atrial fibrillation: Secondary | ICD-10-CM | POA: Diagnosis not present

## 2017-03-15 DIAGNOSIS — I110 Hypertensive heart disease with heart failure: Secondary | ICD-10-CM | POA: Diagnosis not present

## 2017-03-15 DIAGNOSIS — T8141XD Infection following a procedure, superficial incisional surgical site, subsequent encounter: Secondary | ICD-10-CM | POA: Diagnosis not present

## 2017-03-15 DIAGNOSIS — L02415 Cutaneous abscess of right lower limb: Secondary | ICD-10-CM | POA: Diagnosis not present

## 2017-03-16 ENCOUNTER — Ambulatory Visit (INDEPENDENT_AMBULATORY_CARE_PROVIDER_SITE_OTHER): Payer: Medicare Other | Admitting: Vascular Surgery

## 2017-03-16 ENCOUNTER — Encounter: Payer: Self-pay | Admitting: Vascular Surgery

## 2017-03-16 VITALS — BP 143/77 | HR 70 | Temp 97.5°F | Resp 18 | Ht 66.0 in | Wt 270.3 lb

## 2017-03-16 DIAGNOSIS — T8141XD Infection following a procedure, superficial incisional surgical site, subsequent encounter: Secondary | ICD-10-CM | POA: Diagnosis not present

## 2017-03-16 DIAGNOSIS — L02415 Cutaneous abscess of right lower limb: Secondary | ICD-10-CM | POA: Diagnosis not present

## 2017-03-16 DIAGNOSIS — I110 Hypertensive heart disease with heart failure: Secondary | ICD-10-CM | POA: Diagnosis not present

## 2017-03-16 DIAGNOSIS — I5030 Unspecified diastolic (congestive) heart failure: Secondary | ICD-10-CM | POA: Diagnosis not present

## 2017-03-16 DIAGNOSIS — L03115 Cellulitis of right lower limb: Secondary | ICD-10-CM | POA: Diagnosis not present

## 2017-03-16 DIAGNOSIS — I739 Peripheral vascular disease, unspecified: Secondary | ICD-10-CM

## 2017-03-16 DIAGNOSIS — I4891 Unspecified atrial fibrillation: Secondary | ICD-10-CM | POA: Diagnosis not present

## 2017-03-16 NOTE — Progress Notes (Signed)
Patient is a 71 year old female who returns for follow-up today.  She underwent right popliteal embolectomy February 16, 2017.  She returned postoperatively with an abscess in her below-knee popliteal space.  This grew out MORSA.  She is almost completing her 14-day antibiotic course.  She currently has a VAC on the wound.  She denies any problems with numbness tingling or aching in her foot.  She reports no significant drainage fever or chills.  Physical exam:  Vitals:   03/16/17 1121  BP: (!) 143/77  Pulse: 70  Resp: 18  Temp: (!) 97.5 F (36.4 C)  TempSrc: Oral  SpO2: 94%  Weight: 270 lb 4.8 oz (122.6 kg)  Height: 5\' 6"  (1.676 m)     Right lower extremity open below-knee wound was still some induration.  The wound is overall clean no purulent drainage healthy-appearing beefy red granulation tissue.  Assessment: Healing wound right below knee popliteal area status post embolectomy.  Plan: The patient will return in 3-4 weeks for follow-up.  She will continue her VAC therapy in the meanwhile.

## 2017-03-17 DIAGNOSIS — I4891 Unspecified atrial fibrillation: Secondary | ICD-10-CM | POA: Diagnosis not present

## 2017-03-17 DIAGNOSIS — T8141XD Infection following a procedure, superficial incisional surgical site, subsequent encounter: Secondary | ICD-10-CM | POA: Diagnosis not present

## 2017-03-17 DIAGNOSIS — I110 Hypertensive heart disease with heart failure: Secondary | ICD-10-CM | POA: Diagnosis not present

## 2017-03-17 DIAGNOSIS — L03115 Cellulitis of right lower limb: Secondary | ICD-10-CM | POA: Diagnosis not present

## 2017-03-17 DIAGNOSIS — L02415 Cutaneous abscess of right lower limb: Secondary | ICD-10-CM | POA: Diagnosis not present

## 2017-03-17 DIAGNOSIS — I5030 Unspecified diastolic (congestive) heart failure: Secondary | ICD-10-CM | POA: Diagnosis not present

## 2017-03-21 DIAGNOSIS — I4891 Unspecified atrial fibrillation: Secondary | ICD-10-CM | POA: Diagnosis not present

## 2017-03-21 DIAGNOSIS — L02415 Cutaneous abscess of right lower limb: Secondary | ICD-10-CM | POA: Diagnosis not present

## 2017-03-21 DIAGNOSIS — L03115 Cellulitis of right lower limb: Secondary | ICD-10-CM | POA: Diagnosis not present

## 2017-03-21 DIAGNOSIS — I5030 Unspecified diastolic (congestive) heart failure: Secondary | ICD-10-CM | POA: Diagnosis not present

## 2017-03-21 DIAGNOSIS — I110 Hypertensive heart disease with heart failure: Secondary | ICD-10-CM | POA: Diagnosis not present

## 2017-03-21 DIAGNOSIS — T8141XD Infection following a procedure, superficial incisional surgical site, subsequent encounter: Secondary | ICD-10-CM | POA: Diagnosis not present

## 2017-03-23 ENCOUNTER — Other Ambulatory Visit: Payer: Self-pay | Admitting: Cardiology

## 2017-03-23 ENCOUNTER — Ambulatory Visit (INDEPENDENT_AMBULATORY_CARE_PROVIDER_SITE_OTHER): Payer: Medicare Other | Admitting: Internal Medicine

## 2017-03-23 ENCOUNTER — Emergency Department (HOSPITAL_COMMUNITY): Payer: Medicare Other

## 2017-03-23 ENCOUNTER — Inpatient Hospital Stay (HOSPITAL_COMMUNITY)
Admission: EM | Admit: 2017-03-23 | Discharge: 2017-03-28 | DRG: 872 | Disposition: A | Discharge status: Home / Self Care | Payer: Medicare Other | Attending: Internal Medicine | Admitting: Internal Medicine

## 2017-03-23 ENCOUNTER — Encounter (HOSPITAL_COMMUNITY): Payer: Self-pay | Admitting: Emergency Medicine

## 2017-03-23 DIAGNOSIS — Z5181 Encounter for therapeutic drug level monitoring: Secondary | ICD-10-CM

## 2017-03-23 DIAGNOSIS — L089 Local infection of the skin and subcutaneous tissue, unspecified: Secondary | ICD-10-CM | POA: Diagnosis present

## 2017-03-23 DIAGNOSIS — I4891 Unspecified atrial fibrillation: Secondary | ICD-10-CM | POA: Diagnosis not present

## 2017-03-23 DIAGNOSIS — L0390 Cellulitis, unspecified: Secondary | ICD-10-CM

## 2017-03-23 DIAGNOSIS — L03115 Cellulitis of right lower limb: Secondary | ICD-10-CM | POA: Diagnosis present

## 2017-03-23 DIAGNOSIS — N179 Acute kidney failure, unspecified: Secondary | ICD-10-CM | POA: Diagnosis not present

## 2017-03-23 DIAGNOSIS — A419 Sepsis, unspecified organism: Secondary | ICD-10-CM

## 2017-03-23 DIAGNOSIS — I4821 Permanent atrial fibrillation: Secondary | ICD-10-CM | POA: Diagnosis present

## 2017-03-23 DIAGNOSIS — I480 Paroxysmal atrial fibrillation: Secondary | ICD-10-CM | POA: Diagnosis present

## 2017-03-23 DIAGNOSIS — D6862 Lupus anticoagulant syndrome: Secondary | ICD-10-CM | POA: Diagnosis present

## 2017-03-23 DIAGNOSIS — T148XXA Other injury of unspecified body region, initial encounter: Secondary | ICD-10-CM

## 2017-03-23 DIAGNOSIS — I5042 Chronic combined systolic (congestive) and diastolic (congestive) heart failure: Secondary | ICD-10-CM | POA: Diagnosis not present

## 2017-03-23 DIAGNOSIS — I5030 Unspecified diastolic (congestive) heart failure: Secondary | ICD-10-CM | POA: Diagnosis not present

## 2017-03-23 DIAGNOSIS — Z7901 Long term (current) use of anticoagulants: Secondary | ICD-10-CM

## 2017-03-23 DIAGNOSIS — Z86711 Personal history of pulmonary embolism: Secondary | ICD-10-CM

## 2017-03-23 DIAGNOSIS — I743 Embolism and thrombosis of arteries of the lower extremities: Secondary | ICD-10-CM | POA: Diagnosis not present

## 2017-03-23 DIAGNOSIS — M609 Myositis, unspecified: Secondary | ICD-10-CM | POA: Diagnosis present

## 2017-03-23 DIAGNOSIS — M7989 Other specified soft tissue disorders: Secondary | ICD-10-CM | POA: Diagnosis not present

## 2017-03-23 DIAGNOSIS — T8141XD Infection following a procedure, superficial incisional surgical site, subsequent encounter: Secondary | ICD-10-CM | POA: Diagnosis not present

## 2017-03-23 DIAGNOSIS — A408 Other streptococcal sepsis: Secondary | ICD-10-CM | POA: Diagnosis not present

## 2017-03-23 DIAGNOSIS — L02415 Cutaneous abscess of right lower limb: Secondary | ICD-10-CM | POA: Diagnosis not present

## 2017-03-23 DIAGNOSIS — I482 Chronic atrial fibrillation: Secondary | ICD-10-CM | POA: Diagnosis present

## 2017-03-23 DIAGNOSIS — Z86718 Personal history of other venous thrombosis and embolism: Secondary | ICD-10-CM

## 2017-03-23 DIAGNOSIS — B955 Unspecified streptococcus as the cause of diseases classified elsewhere: Secondary | ICD-10-CM

## 2017-03-23 DIAGNOSIS — Z8614 Personal history of Methicillin resistant Staphylococcus aureus infection: Secondary | ICD-10-CM

## 2017-03-23 DIAGNOSIS — R7881 Bacteremia: Secondary | ICD-10-CM

## 2017-03-23 DIAGNOSIS — R0989 Other specified symptoms and signs involving the circulatory and respiratory systems: Secondary | ICD-10-CM | POA: Diagnosis not present

## 2017-03-23 DIAGNOSIS — R509 Fever, unspecified: Secondary | ICD-10-CM | POA: Diagnosis not present

## 2017-03-23 DIAGNOSIS — I110 Hypertensive heart disease with heart failure: Secondary | ICD-10-CM | POA: Diagnosis present

## 2017-03-23 DIAGNOSIS — Z8673 Personal history of transient ischemic attack (TIA), and cerebral infarction without residual deficits: Secondary | ICD-10-CM

## 2017-03-23 LAB — CBC WITH DIFFERENTIAL/PLATELET
BASOS PCT: 0 %
Basophils Absolute: 0 10*3/uL (ref 0.0–0.1)
Eosinophils Absolute: 0 10*3/uL (ref 0.0–0.7)
Eosinophils Relative: 0 %
HEMATOCRIT: 42.7 % (ref 36.0–46.0)
HEMOGLOBIN: 14.2 g/dL (ref 12.0–15.0)
LYMPHS ABS: 1 10*3/uL (ref 0.7–4.0)
LYMPHS PCT: 8 %
MCH: 32.8 pg (ref 26.0–34.0)
MCHC: 33.3 g/dL (ref 30.0–36.0)
MCV: 98.6 fL (ref 78.0–100.0)
MONO ABS: 0.4 10*3/uL (ref 0.1–1.0)
MONOS PCT: 4 %
NEUTROS ABS: 10 10*3/uL — AB (ref 1.7–7.7)
NEUTROS PCT: 88 %
Platelets: 139 10*3/uL — ABNORMAL LOW (ref 150–400)
RBC: 4.33 MIL/uL (ref 3.87–5.11)
RDW: 13.9 % (ref 11.5–15.5)
WBC: 11.4 10*3/uL — ABNORMAL HIGH (ref 4.0–10.5)

## 2017-03-23 LAB — POCT INR: INR: 5

## 2017-03-23 LAB — COMPREHENSIVE METABOLIC PANEL
ALBUMIN: 3 g/dL — AB (ref 3.5–5.0)
ALK PHOS: 91 U/L (ref 38–126)
ALT: 15 U/L (ref 14–54)
ANION GAP: 9 (ref 5–15)
AST: 23 U/L (ref 15–41)
BILIRUBIN TOTAL: 1.2 mg/dL (ref 0.3–1.2)
BUN: 14 mg/dL (ref 6–20)
CALCIUM: 8.6 mg/dL — AB (ref 8.9–10.3)
CO2: 27 mmol/L (ref 22–32)
Chloride: 98 mmol/L — ABNORMAL LOW (ref 101–111)
Creatinine, Ser: 1.18 mg/dL — ABNORMAL HIGH (ref 0.44–1.00)
GFR calc Af Amer: 52 mL/min — ABNORMAL LOW (ref >60)
GFR, EST NON AFRICAN AMERICAN: 45 mL/min — AB (ref >60)
GLUCOSE: 133 mg/dL — AB (ref 65–99)
Potassium: 4.1 mmol/L (ref 3.5–5.1)
Sodium: 134 mmol/L — ABNORMAL LOW (ref 135–145)
TOTAL PROTEIN: 8.3 g/dL — AB (ref 6.5–8.1)

## 2017-03-23 LAB — I-STAT CG4 LACTIC ACID, ED: Lactic Acid, Venous: 1.57 mmol/L (ref 0.5–1.9)

## 2017-03-23 LAB — PROTIME-INR
INR: 3.82
Prothrombin Time: 37.3 seconds — ABNORMAL HIGH (ref 11.4–15.2)

## 2017-03-23 LAB — C-REACTIVE PROTEIN: CRP: 7.1 mg/dL — ABNORMAL HIGH (ref <1.0)

## 2017-03-23 LAB — SEDIMENTATION RATE: SED RATE: 49 mm/h — AB (ref 0–22)

## 2017-03-23 MED ORDER — IBUPROFEN 400 MG PO TABS
400.0000 mg | ORAL_TABLET | Freq: Once | ORAL | Status: AC
  Administered 2017-03-23: 400 mg via ORAL
  Filled 2017-03-23: qty 1

## 2017-03-23 MED ORDER — PIPERACILLIN-TAZOBACTAM 3.375 G IVPB
3.3750 g | Freq: Three times a day (TID) | INTRAVENOUS | Status: DC
  Administered 2017-03-24 – 2017-03-25 (×4): 3.375 g via INTRAVENOUS
  Filled 2017-03-23 (×5): qty 50

## 2017-03-23 MED ORDER — PANTOPRAZOLE SODIUM 40 MG PO TBEC
40.0000 mg | DELAYED_RELEASE_TABLET | Freq: Once | ORAL | Status: AC
  Administered 2017-03-23: 40 mg via ORAL
  Filled 2017-03-23: qty 1

## 2017-03-23 MED ORDER — VANCOMYCIN HCL 10 G IV SOLR
2000.0000 mg | Freq: Once | INTRAVENOUS | Status: AC
  Administered 2017-03-23: 2000 mg via INTRAVENOUS
  Filled 2017-03-23: qty 2000

## 2017-03-23 MED ORDER — SODIUM CHLORIDE 0.9 % IV BOLUS (SEPSIS)
1000.0000 mL | Freq: Once | INTRAVENOUS | Status: AC
  Administered 2017-03-23: 1000 mL via INTRAVENOUS

## 2017-03-23 MED ORDER — DILTIAZEM LOAD VIA INFUSION: 10.0000 mg | Freq: Once | INTRAVENOUS | Status: DC

## 2017-03-23 MED ORDER — ONDANSETRON HCL 4 MG/2ML IJ SOLN
4.0000 mg | Freq: Once | INTRAMUSCULAR | Status: AC
  Administered 2017-03-23: 4 mg via INTRAVENOUS
  Filled 2017-03-23: qty 2

## 2017-03-23 MED ORDER — PIPERACILLIN-TAZOBACTAM 3.375 G IVPB 30 MIN
3.3750 g | Freq: Once | INTRAVENOUS | Status: AC
  Administered 2017-03-23: 3.375 g via INTRAVENOUS
  Filled 2017-03-23: qty 50

## 2017-03-23 MED ORDER — SODIUM CHLORIDE 0.9 % IV SOLN
Freq: Once | INTRAVENOUS | Status: AC
  Administered 2017-03-23: 22:00:00 via INTRAVENOUS

## 2017-03-23 MED ORDER — DILTIAZEM HCL-DEXTROSE 100-5 MG/100ML-% IV SOLN (PREMIX): 5.0000 mg/h | INTRAVENOUS | Status: DC

## 2017-03-23 MED ORDER — ACETAMINOPHEN 500 MG PO TABS
1000.0000 mg | ORAL_TABLET | Freq: Once | ORAL | Status: AC
  Administered 2017-03-23: 1000 mg via ORAL
  Filled 2017-03-23: qty 2

## 2017-03-23 MED ORDER — VANCOMYCIN HCL 10 G IV SOLR
1500.0000 mg | INTRAVENOUS | Status: DC
  Administered 2017-03-24: 1500 mg via INTRAVENOUS
  Filled 2017-03-23: qty 1500

## 2017-03-23 NOTE — ED Triage Notes (Signed)
Per EMS, patient is coming from home complaining of a low grade fever of 99.2 and nausea/vomiting.  Denies pain.  Wound vac on right leg due to blood clot.  104/66, 109 HR, RR 18, 96% RA, No IV.

## 2017-03-23 NOTE — Consult Note (Signed)
[facility]ist of   Patient name: [name] MRN: [id number] DOB: November 19, 1945 Sex: female   REQUESTING PROVIDER:    ER   REASON FOR CONSULT:    Wound Infection  HISTORY OF PRESENT ILLNESS:   [name] is a 71 y.o. female, who initially presented to the hospital on 02/15/2017.  She was evaluated by Dr. Oneida Alar and found to have an acutely ischemic right foot.  She has a history of atrial fibrillation with prior embolic events including a stroke in 2012 and bilateral femoral embolectomy's in 2012.  She does have a history of DVT as well as an IVC filter.  She is lupus anticoagulant positive.  She was taken to the operating room on 02/16/2017 and a embolectomy was performed via a medial below-knee incision.  She required a return trip to the operating room on 03/05/2017 for evacuation of a large amount of purulence.  She has had a wound VAC in place and was last seen on 03/16/2017 in the office.  At that time she had a clean healthy wound.  She presented to the emergency department today.  She notes that 2 days ago her incision became red and firm.  This morning she had a temperature of 101.5.  Her white count was 11.4.  Her INR was 3.8.  She was started on IV fluids as well as IV antibiotics.  She was admitted for cellulitis.  PAST MEDICAL HISTORY    Past Medical History:  Diagnosis Date  . (HFpEF) heart failure with preserved ejection fraction ([place])    a. 06/2008 Echo: EF 55-60%, mild LVH, triv AI, mild to mod MS, mod to sev dil LA, mildly dil RA.  Marland Kitchen Acute right MCA stroke ([place])    a. 06/29/1538 Embolic stroke treated with TPA and thrombectomy with hemorrhagic transformation; Carotids 3/12 negative for ICA stenosis.  . Bilateral Pulmonary Emboli    a. 08/2005 - s/p IVC filter.  . Depression   . Embolus of femoral artery ([place])    a. 06/2008 s/p bilateral embolectomies.  . History of DVT (deep vein thrombosis)    a. s/p IVC  filter.  Marland Kitchen HIT (heparin-induced thrombocytopenia) ([place])   . HTN (hypertension)   . Lupus anticoagulant disorder ([place])   . Obesity, unspecified   . Permanent atrial fibrillation ([place])    a. On chronic Coumadin - INRs followed by PCP; b. CHA2DS2VASc = 5.  . Popliteal artery embolism, right ([place])    a. 01/2017 in the setting of subRx INR-->s/p embolectomy.     FAMILY HISTORY   Family History  Problem Relation Age of Onset  . Coronary artery disease Unknown   . Diabetes Unknown     SOCIAL HISTORY:   Social History   Socioeconomic History  . Marital status: Single    Spouse name: Not on file  . Number of children: Not on file  . Years of education: Not on file  . Highest education level: Not on file  Social Needs  . Financial resource strain: Not on file  . Food insecurity - worry: Not on file  . Food insecurity - inability: Not on file  . Transportation needs - medical: Not on file  . Transportation needs - non-medical: Not on file  Occupational History  . Not on file  Tobacco Use  . Smoking status: Never Smoker  . Smokeless tobacco: Never Used  Substance and Sexual Activity  . Alcohol use: No  . Drug use: No  .  Sexual activity: Not Currently  Other Topics Concern  . Not on file  Social History Narrative    Lives alone.  She works at a [employer].   Daughter in area to assist if needed.  One-level home, 5-6 steps to   entry.  Functional history prior to admission was independent,   functional status upon admission to rehab services was +2, total assist   bed mobility,+2 total assist transfers, ambulation not tested, moderate   assist upper body, total assist lower body, activities of daily living.       ALLERGIES:    Allergies  Allergen Reactions  . Heparin Hives    Patient attests severe allergy- rash all over, severe itching, unable to take heparin  . Other Itching and Rash    "Sheets and Linens used by Zacarias Pontes"    CURRENT MEDICATIONS:     Current Facility-Administered Medications  Medication Dose Route Frequency Provider Last Rate Last Dose  . sodium chloride 0.9 % bolus 1,000 mL  1,000 mL Intravenous Once Duffy Bruce, MD 1,000 mL/hr at 03/23/17 2100 1,000 mL at 03/23/17 2100  . vancomycin (VANCOCIN) 2,000 mg in sodium chloride 0.9 % 500 mL IVPB  2,000 mg Intravenous Once Duffy Bruce, MD 250 mL/hr at 03/23/17 2021 2,000 mg at 03/23/17 2021   Current Outpatient Medications  Medication Sig Dispense Refill  . acetaminophen (TYLENOL) 500 MG tablet Take 1,000 mg by mouth every 6 (six) hours as needed for mild pain.    Marland Kitchen aspirin 81 MG tablet Take 81 mg by mouth daily.      Marland Kitchen CARTIA XT 300 MG 24 hr capsule TAKE 1 CAPSULE BY MOUTH ONCE DAILY (Patient taking differently: TAKE 300 mg CAPSULE BY MOUTH ONCE DAILY) 90 capsule 0  . furosemide (LASIX) 40 MG tablet Take 1 tablet (40 mg total) by mouth daily. TAKE ONE TABLET BY MOUTH ONCE DAILY. NEED OFFICE VISIT FOR ADDITIONAL REFILLS. 30 tablet 0  . metoprolol tartrate (LOPRESSOR) 25 MG tablet Take 0.5 tablets (12.5 mg total) by mouth 2 (two) times daily. KEEP OV. 90 tablet 0  . potassium chloride (K-DUR) 10 MEQ tablet Take 1 tablet (10 mEq total) by mouth daily. 30 tablet 11  . warfarin (COUMADIN) 3 MG tablet TAKE 1&1/2 TO 2 TABLETS BY MOUTH ONCE DAILY AS DIRECTED(NEEDS INR AND MD APPT FOR FURTHER REFILLS) (Patient taking differently: Take 3 mg by mouth See admin instructions. Take 6 mg On Tuesday , Thursday, Saturday and Sunday Take 4.5 mg on Monday Wednesday and Friday) 70 tablet 1    REVIEW OF SYSTEMS:   [X]  denotes positive finding, [ ]  denotes negative finding Cardiac  Comments:  Chest pain or chest pressure:    Shortness of breath upon exertion:    Short of breath when lying flat:    Irregular heart rhythm: x       Vascular    Pain in calf, thigh, or hip brought on by ambulation:    Pain in feet at night that wakes you up from your sleep:     Blood clot in your  veins:    Leg swelling:  x       Pulmonary    Oxygen at home:    Productive cough:     Wheezing:         Neurologic    Sudden weakness in arms or legs:     Sudden numbness in arms or legs:     Sudden onset of difficulty speaking or slurred  speech:    Temporary loss of vision in one eye:     Problems with dizziness:         Gastrointestinal    Blood in stool:      Vomited blood:         Genitourinary    Burning when urinating:     Blood in urine:        Psychiatric    Major depression:         Hematologic    Bleeding problems:    Problems with blood clotting too easily:        Skin    Rashes or ulcers:        Constitutional    Fever or chills: x    PHYSICAL EXAM:   Vitals:   03/23/17 1903 03/23/17 1906 03/23/17 2046 03/23/17 2111  BP: 113/85  104/88   Pulse: 96  64   Resp: 20  (!) 27   Temp: 99.9 F (37.7 C)   (!) 105.2 F (40.7 C)  TempSrc:    Rectal  SpO2: 95%  (!) 78%   Weight: 280 lb (127 kg) 280 lb (127 kg)    Height: 5\' 6"  (1.676 m) 5\' 6"  (1.676 m)      GENERAL: The patient is a well-nourished female, in no acute distress. The vital signs are documented above. Dressing was changed today.  I explored the wound digitally and did not identify any new pockets or areas of purulence.  The tissue appears healthy.  No obvious abscess.  Minimal surrounding erythema  STUDIES:   None  ASSESSMENT and PLAN   Patient was admitted for cellulitis and sepsis with presumable source of her right open below-knee incision.  No abscess was identified.  Would recommend continuing IV antibiotics.  If patient does not make significant improvements, would consider CT scan versus ultrasound of the right leg to rule out a deeper area of infection.   Annamarie Major, MD [facility] [phone] Pager [phone]

## 2017-03-23 NOTE — ED Notes (Signed)
Patient transported to X-ray 

## 2017-03-23 NOTE — Progress Notes (Signed)
Pharmacy Antibiotic Note  [name] is a 71 y.o. female admitted on 03/23/2017 with cellulitis.  Pharmacy has been consulted for vancomycin and zosyn dosing.  Patient had recent popliteal artery embolism s/p embolectomy 2 weeks ago. Notes indicate for two days in right leg incision with swelling. WBC is elevated at 11.4. CRP is 7.1. Sed rate is 49. Tmax is 105.2 today. Received loading dose of vancomycin 2 g and zosyn in ED.  Plan: Vancomycin 1500 mg IV every 24 hours.  Goal trough 15-20 mcg/mL. Zosyn 3.375g IV q8h (4 hour infusion). Monitor renal function, cx results, clinical picture, and VT as needed  Height: 5\' 6"  (167.6 cm) Weight: 280 lb (127 kg) IBW/kg (Calculated) : 59.3  Temp (24hrs), Avg:102.6 F (39.2 C), Min:99.9 F (37.7 C), Max:105.2 F (40.7 C)  Recent Labs  Lab 03/23/17 1947 03/23/17 2020  WBC 11.4*  --   CREATININE 1.18*  --   LATICACIDVEN  --  1.57    Estimated Creatinine Clearance: 59.6 mL/min (A) (by C-G formula based on SCr of 1.18 mg/dL (H)).    Allergies  Allergen Reactions  . Heparin Hives    Patient attests severe allergy- rash all over, severe itching, unable to take heparin  . Other Itching and Rash    "Sheets and Linens used by Zacarias Pontes"    Antimicrobials this admission: Vancomycin 12/27 >>  Zosyn 12/27 >>   Dose adjustments this admission: N/A  Microbiology results: 12/27 BCx: sent  Thank you for allowing pharmacy to be a part of this patient's care.  Doylene Canard, PharmD Clinical Pharmacist  Pager: [phone] Phone: [phone] 03/23/2017 10:07 PM

## 2017-03-23 NOTE — ED Provider Notes (Signed)
[facility] EMERGENCY DEPARTMENT Provider Note   CSN: [id number] Arrival date & time: 03/23/17  1903     History   Chief Complaint Chief Complaint  Patient presents with  . Fever  . Emesis    HPI [name] is a 71 y.o. female.  HPI   71 yo F with h/o HFpEF, CVA, femoral artery embolism s/p embolectomy c/b infection here with fever. Pt reports two days of fatigue, fever, chills, and now nausea/vomiting. Pt had her wound vac changed today and RN noted it to be "tunneling." She denies any increased pain or drainage. Pt has also had worsening fatigue, poor appetite. No confusion. No CP. No abdominal pain. No dysuria or hematuria. No other complaints. Family states this is similar ot her last presentation for cellulitis earlier this month.  Past Medical History:  Diagnosis Date  . (HFpEF) heart failure with preserved ejection fraction ([place])    a. 06/2008 Echo: EF 55-60%, mild LVH, triv AI, mild to mod MS, mod to sev dil LA, mildly dil RA.  Marland Kitchen Acute right MCA stroke ([place])    a. 09/25/2456 Embolic stroke treated with TPA and thrombectomy with hemorrhagic transformation; Carotids 3/12 negative for ICA stenosis.  . Bilateral Pulmonary Emboli    a. 08/2005 - s/p IVC filter.  . Depression   . Embolus of femoral artery ([place])    a. 06/2008 s/p bilateral embolectomies.  . History of DVT (deep vein thrombosis)    a. s/p IVC filter.  Marland Kitchen HIT (heparin-induced thrombocytopenia) ([place])   . HTN (hypertension)   . Lupus anticoagulant disorder ([place])   . Obesity, unspecified   . Permanent atrial fibrillation ([place])    a. On chronic Coumadin - INRs followed by PCP; b. CHA2DS2VASc = 5.  . Popliteal artery embolism, right ([place])    a. 01/2017 in the setting of subRx INR-->s/p embolectomy.    Patient Active Problem List   Diagnosis Date Noted  . Wound infection 03/24/2017  . Sepsis ([place]) 03/04/2017  . Cellulitis 03/04/2017  . Popliteal artery embolus ([place]) 02/16/2017  .  Encounter for therapeutic drug monitoring 06/11/2013  . CVA (cerebral infarction) 11/18/2010  . Edema 08/18/2010  . Long term (current) use of anticoagulants 08/09/2010  . Obesity, unspecified 08/14/2008  . ATRIAL FIBRILLATION 03/07/2008  . PULMONARY EMBOLISM 08/30/2005  . ACUT VENUS EMBO&THROMB DEEP VES PROX LOWR EXTREM 08/30/2005    Past Surgical History:  Procedure Laterality Date  . distal radius fracture. (12,/16/2010)    . EMBOLECTOMY Right 02/15/2017   Procedure: EMBOLECTOMY RIGHT LEG;  Surgeon: Elam Dutch, MD;  Location: [facility];  Service: Vascular;  Laterality: Right;  . I&D EXTREMITY Right 03/05/2017   Procedure: IRRIGATION AND DEBRIDEMENT RIGHT LEG HEMATOMA EVACUATION;  Surgeon: Elam Dutch, MD;  Location: [facility];  Service: Vascular;  Laterality: Right;  . IVC Placement 08/30/2005    . Open reduction and internal fixation of left intra-articular      OB History    No data available       Home Medications    Prior to Admission medications   Medication Sig Start Date End Date Taking? Authorizing Provider  acetaminophen (TYLENOL) 500 MG tablet Take 1,000 mg by mouth every 6 (six) hours as needed for mild pain.   Yes [provider]  aspirin 81 MG tablet Take 81 mg by mouth daily.     Yes [provider]  CARTIA XT 300 MG 24 hr capsule TAKE 1 CAPSULE BY  MOUTH ONCE DAILY Patient taking differently: TAKE 300 mg CAPSULE BY MOUTH ONCE DAILY 01/25/17  Yes Minus Breeding, MD  furosemide (LASIX) 40 MG tablet Take 1 tablet (40 mg total) by mouth daily. TAKE ONE TABLET BY MOUTH ONCE DAILY. NEED OFFICE VISIT FOR ADDITIONAL REFILLS. 02/21/17  Yes [name], PA-C  metoprolol tartrate (LOPRESSOR) 25 MG tablet Take 0.5 tablets (12.5 mg total) by mouth 2 (two) times daily. KEEP OV. 10/31/16  Yes Minus Breeding, MD  potassium chloride (K-DUR) 10 MEQ tablet Take 1 tablet (10 mEq total) by mouth daily. 08/18/10 03/23/17 Yes Weaver, Scott T, PA-C  warfarin  (COUMADIN) 3 MG tablet TAKE 1&1/2 TO 2 TABLETS BY MOUTH ONCE DAILY AS DIRECTED(NEEDS INR AND MD APPT FOR FURTHER REFILLS) Patient taking differently: Take 3 mg by mouth See admin instructions. Take 6 mg On Tuesday , Thursday, Saturday and Sunday Take 4.5 mg on Monday Wednesday and Friday 03/23/17  Yes Minus Breeding, MD    Family History Family History  Problem Relation Age of Onset  . Coronary artery disease Unknown   . Diabetes Unknown     Social History Social History   Tobacco Use  . Smoking status: Never Smoker  . Smokeless tobacco: Never Used  Substance Use Topics  . Alcohol use: No  . Drug use: No     Allergies   Heparin and Other   Review of Systems Review of Systems  Constitutional: Positive for fatigue and fever. Negative for chills.  HENT: Negative for congestion, rhinorrhea and sore throat.   Eyes: Negative for visual disturbance.  Respiratory: Negative for cough, shortness of breath and wheezing.   Cardiovascular: Negative for chest pain and leg swelling.  Gastrointestinal: Negative for abdominal pain, diarrhea, nausea and vomiting.  Genitourinary: Negative for dysuria, flank pain, vaginal bleeding and vaginal discharge.  Musculoskeletal: Negative for neck pain and neck stiffness.  Skin: Negative for rash and wound.  Allergic/Immunologic: Negative for immunocompromised state.  Neurological: Negative for syncope, weakness and headaches.  Hematological: Does not bruise/bleed easily.  All other systems reviewed and are negative.    Physical Exam Updated Vital Signs BP 106/69   Pulse 90   Temp 99.6 F (37.6 C) (Rectal)   Resp 19   Ht 5\' 6"  (1.676 m)   Wt 127 kg (280 lb)   SpO2 96%   BMI 45.19 kg/m   Physical Exam  Constitutional: She is oriented to person, place, and time. She appears well-developed and well-nourished. No distress.  HENT:  Head: Normocephalic and atraumatic.  Eyes: Conjunctivae are normal.  Neck: Neck supple.  Cardiovascular:  Regular rhythm and normal heart sounds. Tachycardia present. Exam reveals no friction rub.  No murmur heard. Pulmonary/Chest: Effort normal and breath sounds normal. No respiratory distress. She has no wheezes. She has no rales.  Abdominal: She exhibits no distension.  Musculoskeletal: She exhibits no edema.  Neurological: She is alert and oriented to person, place, and time. She exhibits normal muscle tone.  Skin: Skin is warm. Capillary refill takes less than 2 seconds.  Psychiatric: She has a normal mood and affect.  Nursing note and vitals reviewed.   LOWER EXTREMITY EXAM: RIGHT  INSPECTION & PALPATION: Moderate erythema, warmth surrounding edges of wound. Wound appears to have intact granulation tissue in wound bed, though it does track deep. No expressible purulence.       SENSORY: sensation is intact to light touch in:  Superficial peroneal nerve distribution (over dorsum of foot) Deep peroneal nerve distribution (over first  dorsal web space) Sural nerve distribution (over lateral aspect 5th metatarsal) Saphenous nerve distribution (over medial instep)  MOTOR:  + Motor EHL (great toe dorsiflexion) + FHL (great toe plantar flexion)  + TA (ankle dorsiflexion)  + GSC (ankle plantar flexion)  VASCULAR: 2+ dorsalis pedis and posterior tibialis pulses Capillary refill < 2 sec, toes warm and well-perfused  COMPARTMENTS: Soft, warm, well-perfused No pain with passive extension No parethesias   ED Treatments / Results  Labs (all labs ordered are listed, but only abnormal results are displayed) Labs Reviewed  CBC WITH DIFFERENTIAL/PLATELET - Abnormal; Notable for the following components:      Result Value   WBC 11.4 (*)    Platelets 139 (*)    Neutro Abs 10.0 (*)    All other components within normal limits  COMPREHENSIVE METABOLIC PANEL - Abnormal; Notable for the following components:   Sodium 134 (*)    Chloride 98 (*)    Glucose, Bld 133 (*)    Creatinine,  Ser 1.18 (*)    Calcium 8.6 (*)    Total Protein 8.3 (*)    Albumin 3.0 (*)    GFR calc non Af Amer 45 (*)    GFR calc Af Amer 52 (*)    All other components within normal limits  SEDIMENTATION RATE - Abnormal; Notable for the following components:   Sed Rate 49 (*)    All other components within normal limits  C-REACTIVE PROTEIN - Abnormal; Notable for the following components:   CRP 7.1 (*)    All other components within normal limits  PROTIME-INR - Abnormal; Notable for the following components:   Prothrombin Time 37.3 (*)    All other components within normal limits  CULTURE, BLOOD (ROUTINE X 2)  CULTURE, BLOOD (ROUTINE X 2)  I-STAT CG4 LACTIC ACID, ED    EKG  EKG Interpretation None       Radiology Dg Chest 2 View  Result Date: 03/23/2017 CLINICAL DATA:  Acute onset of fever.  Right lower leg infection. EXAM: CHEST  2 VIEW COMPARISON:  Chest radiograph performed 03/04/2017 FINDINGS: The lungs are well-aerated. Vascular congestion is noted. There is no evidence of focal opacification, pleural effusion or pneumothorax. The heart is mildly enlarged. No acute osseous abnormalities are seen. IMPRESSION: Vascular congestion and mild cardiomegaly. Lungs remain grossly clear. Electronically Signed   By: Garald Balding M.D.   On: 03/23/2017 22:12   Dg Tibia/fibula Right  Result Date: 03/23/2017 CLINICAL DATA:  Acute onset of fever and subacute onset of right lower leg infection. EXAM: RIGHT TIBIA AND FIBULA - 2 VIEW COMPARISON:  Right foot radiographs performed 10/21/2009 FINDINGS: There is no evidence of osseous erosion. No fracture or dislocation is seen. The knee joint is unremarkable in appearance. No knee joint effusion is identified. The ankle mortise is incompletely assessed, but appears grossly unremarkable. Soft tissue swelling is noted about the proximal lower leg. Postoperative change is seen at the proximal lower leg. IMPRESSION: No evidence of osseous erosion. Soft  tissue swelling noted about the proximal lower leg. Electronically Signed   By: Garald Balding M.D.   On: 03/23/2017 22:13    Procedures .Critical Care Performed by: Duffy Bruce, MD Authorized by: Duffy Bruce, MD   Critical care provider statement:    Critical care time (minutes):  35   Critical care time was exclusive of:  Separately billable procedures and treating other patients and teaching time   Critical care was necessary to treat or prevent  imminent or life-threatening deterioration of the following conditions:  Sepsis and circulatory failure   Critical care was time spent personally by me on the following activities:  Development of treatment plan with patient or surrogate, discussions with consultants, evaluation of patient's response to treatment, examination of patient, obtaining history from patient or surrogate, ordering and performing treatments and interventions, ordering and review of laboratory studies, ordering and review of radiographic studies, pulse oximetry, re-evaluation of patient's condition and review of old charts   I assumed direction of critical care for this patient from another provider in my specialty: no     (including critical care time)  Medications Ordered in ED Medications  vancomycin (VANCOCIN) 1,500 mg in sodium chloride 0.9 % 500 mL IVPB (not administered)  piperacillin-tazobactam (ZOSYN) IVPB 3.375 g (not administered)  piperacillin-tazobactam (ZOSYN) IVPB 3.375 g (0 g Intravenous Stopped 03/23/17 2053)  vancomycin (VANCOCIN) 2,000 mg in sodium chloride 0.9 % 500 mL IVPB (0 mg Intravenous Stopped 03/23/17 2259)  sodium chloride 0.9 % bolus 1,000 mL (0 mLs Intravenous Stopped 03/23/17 2100)  ondansetron (ZOFRAN) injection 4 mg (4 mg Intravenous Given 03/23/17 2047)  sodium chloride 0.9 % bolus 1,000 mL (0 mLs Intravenous Stopped 03/23/17 2213)  acetaminophen (TYLENOL) tablet 1,000 mg (1,000 mg Oral Given 03/23/17 2120)  0.9 %  sodium  chloride infusion ( Intravenous Transfusing/Transfer 03/24/17 0121)  ibuprofen (ADVIL,MOTRIN) tablet 400 mg (400 mg Oral Given 03/23/17 2326)  pantoprazole (PROTONIX) EC tablet 40 mg (40 mg Oral Given 03/23/17 2326)     Initial Impression / Assessment and Plan / ED Course  I have reviewed the triage vital signs and the nursing notes.  Pertinent labs & imaging results that were available during my care of the patient were reviewed by me and considered in my medical decision making (see chart for details).     71 year old female here with fever, fatigue, nausea, and vomiting.  On arrival, she is febrile and tachycardic.  No hypotension.  Lactic acid normal.  She has a persistent leukocytosis and exam does show moderate erythema around the right leg.  I removed the wound VAC and examined the wound.  There is moderate erythema of the skin edges and surrounding skin, but no overt purulence.  The wound does track medially into the deep compartments of the leg, with no drainage.  Using sterile, saline soaked Kerlix gauze, I packed the wound and applied a dry dressing.  Will discuss with vascular surgery but suspect patient will need medical management for cellulitis.  Remainder of labs pending.  Lab work is overall reassuring, though she does have moderate leukocytosis. LA is normal but she has T >8 F - concern for sepsis. Broad-spectrum ABX, fluids given. HR 130-140s AFib RVR when febrile but improving with fluids, fever control. Will be cautious after 2nd liter given known CHF. Admit to step down.  D/w Dr. Trula Slade who will see pt. Pt should remain with wet-to-dry for now until assessed.  Final Clinical Impressions(s) / ED Diagnoses   Final diagnoses:  Sepsis due to cellulitis [facility])    ED Discharge Orders    None       Duffy Bruce, MD 03/24/17 [phone]

## 2017-03-24 ENCOUNTER — Other Ambulatory Visit: Payer: Self-pay

## 2017-03-24 ENCOUNTER — Ambulatory Visit: Payer: Medicare Other | Admitting: Cardiology

## 2017-03-24 ENCOUNTER — Encounter (HOSPITAL_COMMUNITY): Payer: Self-pay | Admitting: Internal Medicine

## 2017-03-24 DIAGNOSIS — L03115 Cellulitis of right lower limb: Secondary | ICD-10-CM | POA: Diagnosis not present

## 2017-03-24 DIAGNOSIS — D6862 Lupus anticoagulant syndrome: Secondary | ICD-10-CM | POA: Diagnosis present

## 2017-03-24 DIAGNOSIS — Z86711 Personal history of pulmonary embolism: Secondary | ICD-10-CM | POA: Diagnosis not present

## 2017-03-24 DIAGNOSIS — I743 Embolism and thrombosis of arteries of the lower extremities: Secondary | ICD-10-CM | POA: Diagnosis not present

## 2017-03-24 DIAGNOSIS — L089 Local infection of the skin and subcutaneous tissue, unspecified: Secondary | ICD-10-CM | POA: Diagnosis present

## 2017-03-24 DIAGNOSIS — I110 Hypertensive heart disease with heart failure: Secondary | ICD-10-CM | POA: Diagnosis present

## 2017-03-24 DIAGNOSIS — S81801A Unspecified open wound, right lower leg, initial encounter: Secondary | ICD-10-CM | POA: Diagnosis not present

## 2017-03-24 DIAGNOSIS — M609 Myositis, unspecified: Secondary | ICD-10-CM | POA: Diagnosis present

## 2017-03-24 DIAGNOSIS — B955 Unspecified streptococcus as the cause of diseases classified elsewhere: Secondary | ICD-10-CM | POA: Diagnosis not present

## 2017-03-24 DIAGNOSIS — I5042 Chronic combined systolic (congestive) and diastolic (congestive) heart failure: Secondary | ICD-10-CM | POA: Diagnosis present

## 2017-03-24 DIAGNOSIS — Z8673 Personal history of transient ischemic attack (TIA), and cerebral infarction without residual deficits: Secondary | ICD-10-CM | POA: Diagnosis not present

## 2017-03-24 DIAGNOSIS — Z452 Encounter for adjustment and management of vascular access device: Secondary | ICD-10-CM | POA: Diagnosis not present

## 2017-03-24 DIAGNOSIS — I480 Paroxysmal atrial fibrillation: Secondary | ICD-10-CM | POA: Diagnosis present

## 2017-03-24 DIAGNOSIS — L0390 Cellulitis, unspecified: Secondary | ICD-10-CM | POA: Diagnosis not present

## 2017-03-24 DIAGNOSIS — Z7901 Long term (current) use of anticoagulants: Secondary | ICD-10-CM | POA: Diagnosis not present

## 2017-03-24 DIAGNOSIS — Z8614 Personal history of Methicillin resistant Staphylococcus aureus infection: Secondary | ICD-10-CM | POA: Diagnosis not present

## 2017-03-24 DIAGNOSIS — I4891 Unspecified atrial fibrillation: Secondary | ICD-10-CM | POA: Diagnosis not present

## 2017-03-24 DIAGNOSIS — R7881 Bacteremia: Secondary | ICD-10-CM | POA: Diagnosis not present

## 2017-03-24 DIAGNOSIS — T148XXA Other injury of unspecified body region, initial encounter: Secondary | ICD-10-CM

## 2017-03-24 DIAGNOSIS — I482 Chronic atrial fibrillation: Secondary | ICD-10-CM | POA: Diagnosis present

## 2017-03-24 DIAGNOSIS — A419 Sepsis, unspecified organism: Secondary | ICD-10-CM

## 2017-03-24 DIAGNOSIS — N179 Acute kidney failure, unspecified: Secondary | ICD-10-CM | POA: Diagnosis not present

## 2017-03-24 DIAGNOSIS — Z86718 Personal history of other venous thrombosis and embolism: Secondary | ICD-10-CM | POA: Diagnosis not present

## 2017-03-24 DIAGNOSIS — A408 Other streptococcal sepsis: Secondary | ICD-10-CM | POA: Diagnosis present

## 2017-03-24 LAB — URINALYSIS, ROUTINE W REFLEX MICROSCOPIC
BILIRUBIN URINE: NEGATIVE
Glucose, UA: NEGATIVE mg/dL
Ketones, ur: NEGATIVE mg/dL
Nitrite: NEGATIVE
PH: 5 (ref 5.0–8.0)
Protein, ur: NEGATIVE mg/dL
SPECIFIC GRAVITY, URINE: 1.012 (ref 1.005–1.030)

## 2017-03-24 LAB — CBC
HEMATOCRIT: 38 % (ref 36.0–46.0)
HEMOGLOBIN: 12.3 g/dL (ref 12.0–15.0)
MCH: 32.5 pg (ref 26.0–34.0)
MCHC: 32.4 g/dL (ref 30.0–36.0)
MCV: 100.3 fL — ABNORMAL HIGH (ref 78.0–100.0)
Platelets: 128 10*3/uL — ABNORMAL LOW (ref 150–400)
RBC: 3.79 MIL/uL — AB (ref 3.87–5.11)
RDW: 14.2 % (ref 11.5–15.5)
WBC: 19.8 10*3/uL — AB (ref 4.0–10.5)

## 2017-03-24 LAB — BLOOD CULTURE ID PANEL (REFLEXED)
ACINETOBACTER BAUMANNII: NOT DETECTED
CANDIDA ALBICANS: NOT DETECTED
CANDIDA GLABRATA: NOT DETECTED
CANDIDA KRUSEI: NOT DETECTED
CANDIDA PARAPSILOSIS: NOT DETECTED
CANDIDA TROPICALIS: NOT DETECTED
ENTEROBACTERIACEAE SPECIES: NOT DETECTED
Enterobacter cloacae complex: NOT DETECTED
Enterococcus species: NOT DETECTED
Escherichia coli: NOT DETECTED
HAEMOPHILUS INFLUENZAE: NOT DETECTED
KLEBSIELLA OXYTOCA: NOT DETECTED
KLEBSIELLA PNEUMONIAE: NOT DETECTED
Listeria monocytogenes: NOT DETECTED
Neisseria meningitidis: NOT DETECTED
Proteus species: NOT DETECTED
Pseudomonas aeruginosa: NOT DETECTED
STREPTOCOCCUS SPECIES: DETECTED — AB
Serratia marcescens: NOT DETECTED
Staphylococcus aureus (BCID): NOT DETECTED
Staphylococcus species: NOT DETECTED
Streptococcus agalactiae: NOT DETECTED
Streptococcus pneumoniae: NOT DETECTED
Streptococcus pyogenes: NOT DETECTED

## 2017-03-24 LAB — BASIC METABOLIC PANEL
ANION GAP: 11 (ref 5–15)
BUN: 14 mg/dL (ref 6–20)
CO2: 25 mmol/L (ref 22–32)
Calcium: 7.9 mg/dL — ABNORMAL LOW (ref 8.9–10.3)
Chloride: 100 mmol/L — ABNORMAL LOW (ref 101–111)
Creatinine, Ser: 1.3 mg/dL — ABNORMAL HIGH (ref 0.44–1.00)
GFR, EST AFRICAN AMERICAN: 47 mL/min — AB (ref >60)
GFR, EST NON AFRICAN AMERICAN: 40 mL/min — AB (ref >60)
Glucose, Bld: 137 mg/dL — ABNORMAL HIGH (ref 65–99)
POTASSIUM: 3.7 mmol/L (ref 3.5–5.1)
SODIUM: 136 mmol/L (ref 135–145)

## 2017-03-24 LAB — PROTIME-INR
INR: 3.67
Prothrombin Time: 36.2 seconds — ABNORMAL HIGH (ref 11.4–15.2)

## 2017-03-24 LAB — MRSA PCR SCREENING: MRSA BY PCR: NEGATIVE

## 2017-03-24 MED ORDER — POTASSIUM CHLORIDE CRYS ER 10 MEQ PO TBCR
10.0000 meq | EXTENDED_RELEASE_TABLET | Freq: Every day | ORAL | Status: DC
  Administered 2017-03-24 – 2017-03-27 (×4): 10 meq via ORAL
  Filled 2017-03-24 (×4): qty 1

## 2017-03-24 MED ORDER — WARFARIN SODIUM 6 MG PO TABS: 6.0000 mg | ORAL_TABLET | ORAL | Status: DC

## 2017-03-24 MED ORDER — SODIUM CHLORIDE 0.9 % IV SOLN
Freq: Once | INTRAVENOUS | Status: AC
  Administered 2017-03-24: 10:00:00 via INTRAVENOUS

## 2017-03-24 MED ORDER — DILTIAZEM HCL ER COATED BEADS 180 MG PO CP24
300.0000 mg | ORAL_CAPSULE | Freq: Every day | ORAL | Status: DC
  Administered 2017-03-24 – 2017-03-28 (×5): 300 mg via ORAL
  Filled 2017-03-24 (×5): qty 1

## 2017-03-24 MED ORDER — WARFARIN - PHARMACIST DOSING INPATIENT
Freq: Every day | Status: DC
Start: 2017-03-24 — End: 2017-03-28
  Administered 2017-03-28: 18:00:00

## 2017-03-24 MED ORDER — ONDANSETRON HCL 4 MG/2ML IJ SOLN: 4.0000 mg | Freq: Four times a day (QID) | INTRAMUSCULAR | Status: DC | PRN

## 2017-03-24 MED ORDER — ASPIRIN 81 MG PO CHEW
81.0000 mg | CHEWABLE_TABLET | Freq: Every day | ORAL | Status: DC
  Administered 2017-03-24 – 2017-03-28 (×5): 81 mg via ORAL
  Filled 2017-03-24 (×5): qty 1

## 2017-03-24 MED ORDER — FUROSEMIDE 40 MG PO TABS
40.0000 mg | ORAL_TABLET | Freq: Every day | ORAL | Status: DC
  Administered 2017-03-24: 40 mg via ORAL
  Filled 2017-03-24: qty 1

## 2017-03-24 MED ORDER — WARFARIN SODIUM 2.5 MG PO TABS
4.5000 mg | ORAL_TABLET | ORAL | Status: DC
  Administered 2017-03-24: 17:00:00 4.5 mg via ORAL
  Filled 2017-03-24: qty 1

## 2017-03-24 MED ORDER — POLYETHYLENE GLYCOL 3350 17 G PO PACK
17.0000 g | PACK | Freq: Every day | ORAL | Status: DC | PRN
  Administered 2017-03-24: 17 g via ORAL
  Filled 2017-03-24: qty 1

## 2017-03-24 MED ORDER — METOPROLOL TARTRATE 12.5 MG HALF TABLET
12.5000 mg | ORAL_TABLET | Freq: Two times a day (BID) | ORAL | Status: DC
  Administered 2017-03-24 – 2017-03-28 (×9): 12.5 mg via ORAL
  Filled 2017-03-24 (×9): qty 1

## 2017-03-24 MED ORDER — ACETAMINOPHEN 325 MG PO TABS: 650.0000 mg | ORAL_TABLET | Freq: Four times a day (QID) | ORAL | Status: DC | PRN

## 2017-03-24 MED ORDER — ONDANSETRON HCL 4 MG PO TABS
4.0000 mg | ORAL_TABLET | Freq: Four times a day (QID) | ORAL | Status: DC | PRN
  Administered 2017-03-27 – 2017-03-28 (×2): 4 mg via ORAL
  Filled 2017-03-24 (×2): qty 1

## 2017-03-24 MED ORDER — ACETAMINOPHEN 650 MG RE SUPP: 650.0000 mg | Freq: Four times a day (QID) | RECTAL | Status: DC | PRN

## 2017-03-24 NOTE — Progress Notes (Signed)
ANTICOAGULATION CONSULT NOTE - Initial Consult  Pharmacy Consult for Coumadin Indication: Afib + h/o PE + h/o popliteal artery embolus  Allergies  Allergen Reactions  . Heparin Hives    Patient attests severe allergy- rash all over, severe itching, unable to take heparin  . Other Itching and Rash    "Sheets and Linens used by [facility]"    Patient Measurements: Height: 5\' 6"  (167.6 cm) Weight: 263 lb 14.3 oz (119.7 kg) IBW/kg (Calculated) : 59.3  Vital Signs: Temp: 99.6 F (37.6 C) (12/28 0120) Temp Source: Rectal (12/28 0120) BP: 106/69 (12/28 0115) Pulse Rate: 90 (12/28 0115)  Labs: Recent Labs    03/23/17 03/23/17 1947 03/23/17 2036  HGB  --  14.2  --   HCT  --  42.7  --   PLT  --  139*  --   LABPROT  --   --  37.3*  INR 5.0  --  3.82  CREATININE  --  1.18*  --     Estimated Creatinine Clearance: 57.6 mL/min (A) (by C-G formula based on SCr of 1.18 mg/dL (H)).   Medical History: Past Medical History:  Diagnosis Date  . (HFpEF) heart failure with preserved ejection fraction ([place])    a. 06/2008 Echo: EF 55-60%, mild LVH, triv AI, mild to mod MS, mod to sev dil LA, mildly dil RA.  Marland Kitchen Acute right MCA stroke ([place])    a. 09/30/1948 Embolic stroke treated with TPA and thrombectomy with hemorrhagic transformation; Carotids 3/12 negative for ICA stenosis.  . Bilateral Pulmonary Emboli    a. 08/2005 - s/p IVC filter.  . Depression   . Embolus of femoral artery ([place])    a. 06/2008 s/p bilateral embolectomies.  . History of DVT (deep vein thrombosis)    a. s/p IVC filter.  Marland Kitchen HIT (heparin-induced thrombocytopenia) ([place])   . HTN (hypertension)   . Lupus anticoagulant disorder ([place])   . Obesity, unspecified   . Permanent atrial fibrillation ([place])    a. On chronic Coumadin - INRs followed by PCP; b. CHA2DS2VASc = 5.  . Popliteal artery embolism, right ([place])    a. 01/2017 in the setting of subRx INR-->s/p embolectomy.    Medications:  Medications Prior to Admission   Medication Sig Dispense Refill Last Dose  . acetaminophen (TYLENOL) 500 MG tablet Take 1,000 mg by mouth every 6 (six) hours as needed for mild pain.   Past Week at prn  . aspirin 81 MG tablet Take 81 mg by mouth daily.     03/23/2017 at Unknown time  . CARTIA XT 300 MG 24 hr capsule TAKE 1 CAPSULE BY MOUTH ONCE DAILY (Patient taking differently: TAKE 300 mg CAPSULE BY MOUTH ONCE DAILY) 90 capsule 0 03/23/2017 at Unknown time  . furosemide (LASIX) 40 MG tablet Take 1 tablet (40 mg total) by mouth daily. TAKE ONE TABLET BY MOUTH ONCE DAILY. NEED OFFICE VISIT FOR ADDITIONAL REFILLS. 30 tablet 0 03/23/2017 at Unknown time  . metoprolol tartrate (LOPRESSOR) 25 MG tablet Take 0.5 tablets (12.5 mg total) by mouth 2 (two) times daily. KEEP OV. 90 tablet 0 03/23/2017 at 0900  . potassium chloride (K-DUR) 10 MEQ tablet Take 1 tablet (10 mEq total) by mouth daily. 30 tablet 11 03/23/2017 at Unknown time  . warfarin (COUMADIN) 3 MG tablet TAKE 1&1/2 TO 2 TABLETS BY MOUTH ONCE DAILY AS DIRECTED(NEEDS INR AND MD APPT FOR FURTHER REFILLS) (Patient taking differently: Take 3 mg by mouth See admin instructions. Take 6 mg  On Tuesday , Thursday, Saturday and Sunday Take 4.5 mg on Monday Wednesday and Friday) 70 tablet 1    Scheduled:  . aspirin  81 mg Oral Daily  . diltiazem  300 mg Oral Daily  . furosemide  40 mg Oral Daily  . metoprolol tartrate  12.5 mg Oral BID  . potassium chloride  10 mEq Oral Daily   Infusions:  . piperacillin-tazobactam (ZOSYN)  IV    . vancomycin      Assessment: 71yo female admitted w/ cellulitis associated w/ fever/fatigue/N/V, to continue Coumadin for Afib + h/o PE + h/o popliteal artery embolus; current INR above goal (higher goal given multiple indications) w/ last dose of Coumadin taken 12/26; pt was instructed at outpt anticoag visit to hold Coumadin 12/27 and 12/28 and to resume Coumadin 12/28.  Goal of Therapy:  INR 2.5-3.5   Plan:  Will continue home Coumadin dose of  4.5mg  MWF and 6mg  TTSS and monitor INR.  Wynona Neat, PharmD, BCPS  03/24/2017,2:39 AM

## 2017-03-24 NOTE — Progress Notes (Signed)
PHARMACY - PHYSICIAN COMMUNICATION CRITICAL VALUE ALERT - BLOOD CULTURE IDENTIFICATION (BCID)  [name] is an 71 y.o. female who presented to [facility] on 03/23/2017 with a chief complaint of fever and chills with possible wound infection.  Assessment:  She had a thrombectomy in November 2018 and a subsequent wound infection that was debrided and treated with a wound vac. She recently developed redness around the wound with fever and chills. She is currently on Vancomycin and Zosyn empirically. She has 2 blood cultures growing Gram positive cocci with Strep species detected on BCID. BCID algorithm recommends treatment with Ceftriaxone. Discussed with Dr. Broadus John, she would like to await the results of her CT scan and continue MRSA coverage with Vancomycin due to past MRSA infection in her leg.  Name of physician (or Provider) Contacted: Dr. Broadus John via text page  Current antibiotics: Vancomycin and Zosyn  Changes to prescribed antibiotics recommended:  Recommendations declined by provider due to history of resistant organism  Results for orders placed or performed during the hospital encounter of 03/23/17  Blood Culture ID Panel (Reflexed) (Collected: 03/23/2017  8:35 PM)  Result Value Ref Range   Enterococcus species NOT DETECTED NOT DETECTED   Listeria monocytogenes NOT DETECTED NOT DETECTED   Staphylococcus species NOT DETECTED NOT DETECTED   Staphylococcus aureus NOT DETECTED NOT DETECTED   Streptococcus species DETECTED (A) NOT DETECTED   Streptococcus agalactiae NOT DETECTED NOT DETECTED   Streptococcus pneumoniae NOT DETECTED NOT DETECTED   Streptococcus pyogenes NOT DETECTED NOT DETECTED   Acinetobacter baumannii NOT DETECTED NOT DETECTED   Enterobacteriaceae species NOT DETECTED NOT DETECTED   Enterobacter cloacae complex NOT DETECTED NOT DETECTED   Escherichia coli NOT DETECTED NOT DETECTED   Klebsiella oxytoca NOT DETECTED NOT DETECTED   Klebsiella pneumoniae NOT  DETECTED NOT DETECTED   Proteus species NOT DETECTED NOT DETECTED   Serratia marcescens NOT DETECTED NOT DETECTED   Haemophilus influenzae NOT DETECTED NOT DETECTED   Neisseria meningitidis NOT DETECTED NOT DETECTED   Pseudomonas aeruginosa NOT DETECTED NOT DETECTED   Candida albicans NOT DETECTED NOT DETECTED   Candida glabrata NOT DETECTED NOT DETECTED   Candida krusei NOT DETECTED NOT DETECTED   Candida parapsilosis NOT DETECTED NOT DETECTED   Candida tropicalis NOT DETECTED NOT DETECTED    Norva Riffle 03/24/2017  11:47 AM

## 2017-03-24 NOTE — H&P (Signed)
History and Physical    [name] SHF:[id number] DOB: 03-22-1946 DOA: 03/23/2017  PCP: Patient, No Pcp Per  Patient coming from: Home.  Chief Complaint: Fever and chills.  HPI: [name] is a 71 y.o. female with with history of atrial fibrillation, DVT status post IVC filter placement, history of embolic stroke, diastolic CHF who was recently admitted in November for right ischemic foot and underwent embolectomy following which patient had purulence from the wound and had to go to the OR for debridement and was discharged home in December and was placed on wound VAC.  Yesterday morning patient's home health aide noticed that patient was having fever of 101 F.  By the evening patient started having fever and chills and was brought to the ER.  Patient has noticed increasing erythema around the wound.  Denies any chest pain shortness of breath nausea vomiting abdominal pain diarrhea productive cough.  ED Course: In the ER patient was noticed to have a fever of 105 F.  Wound VAC was removed and the wound was examined to have peripheral erythema.  Blood cultures were obtained and patient was started on Vanco and Zosyn.  Dr. Trula Slade on-call vascular surgeon has been consulted.  Review of Systems: As per HPI, rest all negative.   Past Medical History:  Diagnosis Date  . (HFpEF) heart failure with preserved ejection fraction ([place])    a. 06/2008 Echo: EF 55-60%, mild LVH, triv AI, mild to mod MS, mod to sev dil LA, mildly dil RA.  Marland Kitchen Acute right MCA stroke ([place])    a. 5/0/2774 Embolic stroke treated with TPA and thrombectomy with hemorrhagic transformation; Carotids 3/12 negative for ICA stenosis.  . Bilateral Pulmonary Emboli    a. 08/2005 - s/p IVC filter.  . Depression   . Embolus of femoral artery ([place])    a. 06/2008 s/p bilateral embolectomies.  . History of DVT (deep vein thrombosis)    a. s/p IVC filter.  Marland Kitchen HIT (heparin-induced thrombocytopenia) ([place])   . HTN (hypertension)     . Lupus anticoagulant disorder ([place])   . Obesity, unspecified   . Permanent atrial fibrillation ([place])    a. On chronic Coumadin - INRs followed by PCP; b. CHA2DS2VASc = 5.  . Popliteal artery embolism, right ([place])    a. 01/2017 in the setting of subRx INR-->s/p embolectomy.    Past Surgical History:  Procedure Laterality Date  . distal radius fracture. (12,/16/2010)    . EMBOLECTOMY Right 02/15/2017   Procedure: EMBOLECTOMY RIGHT LEG;  Surgeon: Elam Dutch, MD;  Location: [facility];  Service: Vascular;  Laterality: Right;  . I&D EXTREMITY Right 03/05/2017   Procedure: IRRIGATION AND DEBRIDEMENT RIGHT LEG HEMATOMA EVACUATION;  Surgeon: Elam Dutch, MD;  Location: [facility];  Service: Vascular;  Laterality: Right;  . IVC Placement 08/30/2005    . Open reduction and internal fixation of left intra-articular       reports that  has never smoked. she has never used smokeless tobacco. She reports that she does not drink alcohol or use drugs.  Allergies  Allergen Reactions  . Heparin Hives    Patient attests severe allergy- rash all over, severe itching, unable to take heparin  . Other Itching and Rash    "Sheets and Linens used by Zac[facility] Pontes"    Family History  Problem Relation Age of Onset  . Coronary artery disease Unknown   . Diabetes Unknown     Prior to Admission medications  Medication Sig Start Date End Date Taking? Authorizing Provider  acetaminophen (TYLENOL) 500 MG tablet Take 1,000 mg by mouth every 6 (six) hours as needed for mild pain.   Yes [provider]  aspirin 81 MG tablet Take 81 mg by mouth daily.     Yes [provider]  CARTIA XT 300 MG 24 hr capsule TAKE 1 CAPSULE BY MOUTH ONCE DAILY Patient taking differently: TAKE 300 mg CAPSULE BY MOUTH ONCE DAILY 01/25/17  Yes Minus Breeding, MD  furosemide (LASIX) 40 MG tablet Take 1 tablet (40 mg total) by mouth daily. TAKE ONE TABLET BY MOUTH ONCE DAILY. NEED OFFICE VISIT FOR ADDITIONAL  REFILLS. 02/21/17  Yes [name], PA-C  metoprolol tartrate (LOPRESSOR) 25 MG tablet Take 0.5 tablets (12.5 mg total) by mouth 2 (two) times daily. KEEP OV. 10/31/16  Yes Minus Breeding, MD  potassium chloride (K-DUR) 10 MEQ tablet Take 1 tablet (10 mEq total) by mouth daily. 08/18/10 03/23/17 Yes Weaver, Scott T, PA-C  warfarin (COUMADIN) 3 MG tablet TAKE 1&1/2 TO 2 TABLETS BY MOUTH ONCE DAILY AS DIRECTED(NEEDS INR AND MD APPT FOR FURTHER REFILLS) Patient taking differently: Take 3 mg by mouth See admin instructions. Take 6 mg On Tuesday , Thursday, Saturday and Sunday Take 4.5 mg on Monday Wednesday and Friday 03/23/17  Yes Minus Breeding, MD    Physical Exam: Vitals:   03/24/17 0004 03/24/17 0030 03/24/17 0115 03/24/17 0120  BP: 103/85 98/76 106/69   Pulse: 91 93 90   Resp: (!) 23 (!) 22 19   Temp:    99.6 F (37.6 C)  TempSrc:    Rectal  SpO2: 96% 97% 96%   Weight:      Height:          Constitutional: Moderately built and nourished. Vitals:   03/24/17 0004 03/24/17 0030 03/24/17 0115 03/24/17 0120  BP: 103/85 98/76 106/69   Pulse: 91 93 90   Resp: (!) 23 (!) 22 19   Temp:    99.6 F (37.6 C)  TempSrc:    Rectal  SpO2: 96% 97% 96%   Weight:      Height:       Eyes: Anicteric no pallor. ENMT: No discharge from the ears eyes nose or mouth. Neck: No mass felt.  No neck rigidity. Respiratory: No rhonchi or crepitations. Cardiovascular: S1-S2 heard no murmurs appreciated. Abdomen: Soft nontender bowel sounds present. Musculoskeletal: Right leg is under dressing. Skin: Right leg is under dressing. Neurologic: Alert awake oriented to time place and person.  Moves all extremities. Psychiatric: Appears normal.  Normal affect.   Labs on Admission: I have personally reviewed following labs and imaging studies  CBC: Recent Labs  Lab 03/23/17 1947  WBC 11.4*  NEUTROABS 10.0*  HGB 14.2  HCT 42.7  MCV 98.6  PLT 725*   Basic Metabolic Panel: Recent Labs  Lab  03/23/17 1947  NA 134*  K 4.1  CL 98*  CO2 27  GLUCOSE 133*  BUN 14  CREATININE 1.18*  CALCIUM 8.6*   GFR: Estimated Creatinine Clearance: 59.6 mL/min (A) (by C-G formula based on SCr of 1.18 mg/dL (H)). Liver Function Tests: Recent Labs  Lab 03/23/17 1947  AST 23  ALT 15  ALKPHOS 91  BILITOT 1.2  PROT 8.3*  ALBUMIN 3.0*   No results for input(s): LIPASE, AMYLASE in the last 168 hours. No results for input(s): AMMONIA in the last 168 hours. Coagulation Profile: Recent Labs  Lab 03/23/17 03/23/17 2036  INR 5.0 3.82   Cardiac Enzymes: No results for input(s): CKTOTAL, CKMB, CKMBINDEX, TROPONINI in the last 168 hours. BNP (last 3 results) No results for input(s): PROBNP in the last 8760 hours. HbA1C: No results for input(s): HGBA1C in the last 72 hours. CBG: No results for input(s): GLUCAP in the last 168 hours. Lipid Profile: No results for input(s): CHOL, HDL, LDLCALC, TRIG, CHOLHDL, LDLDIRECT in the last 72 hours. Thyroid Function Tests: No results for input(s): TSH, T4TOTAL, FREET4, T3FREE, THYROIDAB in the last 72 hours. Anemia Panel: No results for input(s): VITAMINB12, FOLATE, FERRITIN, TIBC, IRON, RETICCTPCT in the last 72 hours. Urine analysis:    Component Value Date/Time   COLORURINE AMBER (A) 03/05/2017 0251   APPEARANCEUR HAZY (A) 03/05/2017 0251   LABSPEC 1.027 03/05/2017 0251   PHURINE 6.0 03/05/2017 0251   GLUCOSEU NEGATIVE 03/05/2017 0251   HGBUR MODERATE (A) 03/05/2017 0251   BILIRUBINUR NEGATIVE 03/05/2017 0251   KETONESUR NEGATIVE 03/05/2017 0251   PROTEINUR 100 (A) 03/05/2017 0251   UROBILINOGEN 1.0 07/07/2010 1500   NITRITE POSITIVE (A) 03/05/2017 0251   LEUKOCYTESUR NEGATIVE 03/05/2017 0251   Sepsis Labs: @LABRCNTIP (procalcitonin:4,lacticidven:4) )No results found for this or any previous visit (from the past 240 hour(s)).   Radiological Exams on Admission: Dg Chest 2 View  Result Date: 03/23/2017 CLINICAL DATA:  Acute onset of  fever.  Right lower leg infection. EXAM: CHEST  2 VIEW COMPARISON:  Chest radiograph performed 03/04/2017 FINDINGS: The lungs are well-aerated. Vascular congestion is noted. There is no evidence of focal opacification, pleural effusion or pneumothorax. The heart is mildly enlarged. No acute osseous abnormalities are seen. IMPRESSION: Vascular congestion and mild cardiomegaly. Lungs remain grossly clear. Electronically Signed   By: Garald Balding M.D.   On: 03/23/2017 22:12   Dg Tibia/fibula Right  Result Date: 03/23/2017 CLINICAL DATA:  Acute onset of fever and subacute onset of right lower leg infection. EXAM: RIGHT TIBIA AND FIBULA - 2 VIEW COMPARISON:  Right foot radiographs performed 10/21/2009 FINDINGS: There is no evidence of osseous erosion. No fracture or dislocation is seen. The knee joint is unremarkable in appearance. No knee joint effusion is identified. The ankle mortise is incompletely assessed, but appears grossly unremarkable. Soft tissue swelling is noted about the proximal lower leg. Postoperative change is seen at the proximal lower leg. IMPRESSION: No evidence of osseous erosion. Soft tissue swelling noted about the proximal lower leg. Electronically Signed   By: Garald Balding M.D.   On: 03/23/2017 22:13    Assessment/Plan Active Problems:   ATRIAL FIBRILLATION   Long term (current) use of anticoagulants   Popliteal artery embolus (HCC)   Cellulitis   Wound infection   Cellulitis of right lower extremity    1. Possible developing sepsis from right leg cellulitis -appreciate vascular surgery consult.  Patient is on vancomycin and Zosyn.  Closely observe and stepdown overnight.  Continue with gentle hydration.  Lactate was normal.  Follow blood cultures. 2. Atrial fibrillation rate was increased initially.  Improved with fluids.-Patient blood pressure is in the low normal at this time.  May have to hold Cardizem and metoprolol if patient's blood pressure remains low.  Coumadin  per pharmacy. 3. History of DVT and pulmonary embolism with embolic stroke on Coumadin.  Coumadin per pharmacy. 4. History of diastolic CHF -we will hold Lasix for now until blood pressure improves. 5. Recent right thyroid acute ischemic limb status post embolectomy.   DVT prophylaxis: Coumadin. Code Status: Full code. Family Communication: Family  at the bedside. Disposition Plan: Home. Consults called: Vascular surgery. Admission status: Inpatient.   Rise Patience MD [facility] Pager [phone].  If 7PM-7AM, please contact night-coverage [email or web address] Password [facility]  03/24/2017, 2:34 AM

## 2017-03-24 NOTE — Plan of Care (Signed)
Spoke to pt about infection prevention. Will continue to monitor pt for signs/symptoms of infection.

## 2017-03-24 NOTE — Progress Notes (Addendum)
Patient seen and examined, admitted earlier this morning by Dr. Raliegh Ip please see his H&P for details, Briefly [name] is a 71 year old female with history of atrial fibrillation, positive lupus anticoagulant, embolic stroke, DVT status post IVC filter, she was found to have an acutely ischemic right foot on 11/21 subsequently taken to the OR and underwent embolectomy via medial below-knee incision on 11/22, subsequently went back to the OR 12/18 for MRSA abscess with large amount of purulence evacuated and wound VAC placement in the below knee popliteal space. -Readmitted this morning with generalized weakness and fever of 105, denies any other symptoms at this time -Vascular surgery re consulted, wound VAC removed in the emergency room. -Continue IV fluids and broad-spectrum antibiotics, continue Coumadin for now, stop lasix -Plan for CT right leg, will need reversal of anticoagulation issue needs to go back to the OR again -Follow-up cultures -Blood pressure improving, continue metoprolol and diltiazem with holding parameters -Further management as condition evolves  Domenic Polite, MD

## 2017-03-24 NOTE — Progress Notes (Addendum)
  Progress Note    03/24/2017 9:44 AM   Subjective:  Feels much better  Tm 105.2 now afebrile HR 60's-130's Afib 00'P-233'A systolic 07% RA  Vitals:   03/24/17 0732 03/24/17 0921  BP:  108/80  Pulse:  74  Resp:    Temp: 97.9 F (36.6 C)   SpO2:      Physical Exam: Lungs:  Non labored Incisions:  Bandage in place right leg  CBC    Component Value Date/Time   WBC 19.8 (H) 03/24/2017 0230   RBC 3.79 (L) 03/24/2017 0230   HGB 12.3 03/24/2017 0230   HCT 38.0 03/24/2017 0230   PLT 128 (L) 03/24/2017 0230   MCV 100.3 (H) 03/24/2017 0230   MCH 32.5 03/24/2017 0230   MCHC 32.4 03/24/2017 0230   RDW 14.2 03/24/2017 0230   LYMPHSABS 1.0 03/23/2017 1947   MONOABS 0.4 03/23/2017 1947   EOSABS 0.0 03/23/2017 1947   BASOSABS 0.0 03/23/2017 1947    BMET    Component Value Date/Time   NA 136 03/24/2017 0230   K 3.7 03/24/2017 0230   CL 100 (L) 03/24/2017 0230   CO2 25 03/24/2017 0230   GLUCOSE 137 (H) 03/24/2017 0230   BUN 14 03/24/2017 0230   CREATININE 1.30 (H) 03/24/2017 0230   CALCIUM 7.9 (L) 03/24/2017 0230   GFRNONAA 40 (L) 03/24/2017 0230   GFRAA 47 (L) 03/24/2017 0230    INR    Component Value Date/Time   INR 3.67 03/24/2017 0526   INR 2.2 05/07/2010 1039     Intake/Output Summary (Last 24 hours) at 03/24/2017 0944 Last data filed at 03/24/2017 0400 Gross per 24 hour  Intake 2550 ml  Output -  Net 2550 ml   Xray right leg 03/23/17:  No evidence of osseous erosion. Soft tissue swelling noted about the proximal lower leg. CXR 03/23/17:  IMPRESSION: Vascular congestion and mild cardiomegaly. Lungs remain grossly Clear.  Assessment:  71 y.o. female is s/p:  I&D right BK popliteal incision  19 Days post op  Plan: -pt feeling better this morning-says she has felt fine since she had gone home up until yesterday.  Dr. Oneida Alar will be by later to see wound on right leg and then will make decision about reapplying wound vac -pt afebrile  now-leukocytosis of 20k-continue IV abx -DVT prophylaxis:  Coumadin for afib   Leontine Locket, PA-C [facility] [phone] 03/24/2017 9:44 AM  Below knee pop wound is clean with healthy granulation tissue I doubt this is the source of her fever. NS wet to dry dressing BID D/c VAC  She has scheduled follow up with me  Ruta Hinds, MD [facility] [place] Office: [phone] Pager: [phone]

## 2017-03-25 DIAGNOSIS — Z8614 Personal history of Methicillin resistant Staphylococcus aureus infection: Secondary | ICD-10-CM

## 2017-03-25 DIAGNOSIS — I4891 Unspecified atrial fibrillation: Secondary | ICD-10-CM

## 2017-03-25 DIAGNOSIS — T148XXA Other injury of unspecified body region, initial encounter: Secondary | ICD-10-CM

## 2017-03-25 DIAGNOSIS — L089 Local infection of the skin and subcutaneous tissue, unspecified: Secondary | ICD-10-CM

## 2017-03-25 LAB — PROTIME-INR
INR: 4.42 — AB
PROTHROMBIN TIME: 41.8 s — AB (ref 11.4–15.2)

## 2017-03-25 LAB — CBC
HEMATOCRIT: 37.1 % (ref 36.0–46.0)
Hemoglobin: 11.7 g/dL — ABNORMAL LOW (ref 12.0–15.0)
MCH: 31.1 pg (ref 26.0–34.0)
MCHC: 31.5 g/dL (ref 30.0–36.0)
MCV: 98.7 fL (ref 78.0–100.0)
PLATELETS: 118 10*3/uL — AB (ref 150–400)
RBC: 3.76 MIL/uL — ABNORMAL LOW (ref 3.87–5.11)
RDW: 14.1 % (ref 11.5–15.5)
WBC: 7.6 10*3/uL (ref 4.0–10.5)

## 2017-03-25 LAB — BASIC METABOLIC PANEL
ANION GAP: 7 (ref 5–15)
BUN: 19 mg/dL (ref 6–20)
CALCIUM: 8.3 mg/dL — AB (ref 8.9–10.3)
CO2: 28 mmol/L (ref 22–32)
CREATININE: 1.76 mg/dL — AB (ref 0.44–1.00)
Chloride: 104 mmol/L (ref 101–111)
GFR, EST AFRICAN AMERICAN: 32 mL/min — AB (ref >60)
GFR, EST NON AFRICAN AMERICAN: 28 mL/min — AB (ref >60)
Glucose, Bld: 101 mg/dL — ABNORMAL HIGH (ref 65–99)
Potassium: 4 mmol/L (ref 3.5–5.1)
SODIUM: 139 mmol/L (ref 135–145)

## 2017-03-25 MED ORDER — SODIUM CHLORIDE 0.9 % IV SOLN: INTRAVENOUS | Status: AC

## 2017-03-25 MED ORDER — DEXTROSE 5 % IV SOLN
2.0000 g | INTRAVENOUS | Status: DC
  Administered 2017-03-25 – 2017-03-28 (×4): 2 g via INTRAVENOUS
  Filled 2017-03-25 (×5): qty 2

## 2017-03-25 MED ORDER — DOCUSATE SODIUM 100 MG PO CAPS
200.0000 mg | ORAL_CAPSULE | Freq: Two times a day (BID) | ORAL | Status: DC
  Administered 2017-03-25 – 2017-03-27 (×5): 200 mg via ORAL
  Filled 2017-03-25 (×8): qty 2

## 2017-03-25 NOTE — Progress Notes (Signed)
[facility] for Coumadin Indication: Afib + h/o PE + h/o popliteal artery embolus  Allergies  Allergen Reactions  . Heparin Hives    Patient attests severe allergy- rash all over, severe itching, unable to take heparin  . Other Itching and Rash    "Sheets and Linens used by [facility]ac[facility] Pontes"    Labs: Recent Labs    03/23/17 1947 03/23/17 2036 03/24/17 0230 03/24/17 0526 03/25/17 0749  HGB 14.2  --  12.3  --  11.7*  HCT 42.7  --  38.0  --  37.1  PLT 139*  --  128*  --  118*  LABPROT  --  37.3*  --  36.2* 41.8*  INR  --  3.82  --  3.67 4.42*  CREATININE 1.18*  --  1.30*  --  1.76*    Estimated Creatinine Clearance: 38.8 mL/min (A) (by C-G formula based on SCr of 1.76 mg/dL (H)).   Assessment: 71yo female admitted w/ cellulitis associated w/ fever/fatigue/N/V, to continue Coumadin for Afib + h/o PE + h/o popliteal artery embolus; current INR above goal (higher goal given multiple indications)   INR elevated today  Goal of Therapy:  INR 2.5-3.5   Plan:  Hold warfarin today Daily INR  Thank you Anette Guarneri, PharmD [phone] 03/25/2017,11:03 AM

## 2017-03-25 NOTE — Consult Note (Signed)
[facility]  Total days of antibiotics 2        Day 2 vanco/piptazo               Reason for Consult:streptococcal bacteremia   Referring Physician: Broadus John  Active Problems:   ATRIAL FIBRILLATION   Long term (current) use of anticoagulants   Popliteal artery embolus (HCC)   Cellulitis   Wound infection   Cellulitis of right lower extremity    HPI: [name] is a 71 y.o. female of afib, cva, htn, PAD and recent hx of ischemic right leg requiring popliteal artery embolectomy c/b MRSA  large abscess right below-knee popliteal space s/p IX D on 12/9 discharged on doxy til 12/24. She was doing well until 12/27 when she started to have chills, with nausea and vomiting thus wen to the ED for evaluation. She denies that her right leg causing her any pain but possibly had some erythema to her right leg - though she has been wearing a wound vac. She was last seen in vascular clinic on 12/20 and felt she was improving appropriately. She was readmitted on 12/28 for fevers, tmax of 105F , WBC of 19.8K, started on broad spectrum abtx of vanco and piptazo. Her infectious work up revealed group G streptococcus on blood cx. Her abtx have been narrowed to ceftriaxone. Her WBC is now WNL at 7.6 but has element of AKI. She is feeling better, again no discomfort to her right leg. She was seen by vascular who explored her wound at bedside that did not see further signs of purulence but mild surrounding erythema. ID consulted to determine source of her bacteremia  Past Medical History:  Diagnosis Date  . (HFpEF) heart failure with preserved ejection fraction ([place])    a. 06/2008 Echo: EF 55-60%, mild LVH, triv AI, mild to mod MS, mod to sev dil LA, mildly dil RA.  Marland Kitchen Acute right MCA stroke ([place])    a. 05/28/1222 Embolic stroke treated with TPA and thrombectomy with hemorrhagic transformation; Carotids 3/12 negative for ICA stenosis.  . Bilateral Pulmonary Emboli    a. 08/2005 - s/p  IVC filter.  . Depression   . Embolus of femoral artery ([place])    a. 06/2008 s/p bilateral embolectomies.  . History of DVT (deep vein thrombosis)    a. s/p IVC filter.  Marland Kitchen HIT (heparin-induced thrombocytopenia) ([place])   . HTN (hypertension)   . Lupus anticoagulant disorder ([place])   . Obesity, unspecified   . Permanent atrial fibrillation ([place])    a. On chronic Coumadin - INRs followed by PCP; b. CHA2DS2VASc = 5.  . Popliteal artery embolism, right ([place])    a. 01/2017 in the setting of subRx INR-->s/p embolectomy.    Allergies:  Allergies  Allergen Reactions  . Heparin Hives    Patient attests severe allergy- rash all over, severe itching, unable to take heparin  . Other Itching and Rash    "Sheets and Linens used by Zacarias Pontes"      MEDICATIONS: . aspirin  81 mg Oral Daily  . diltiazem  300 mg Oral Daily  . docusate sodium  200 mg Oral BID  . metoprolol tartrate  12.5 mg Oral BID  . potassium chloride  10 mEq Oral Daily  . Warfarin - Pharmacist Dosing Inpatient   Does not apply q1800    Social History   Tobacco Use  . Smoking status: Never Smoker  . Smokeless tobacco: Never Used  Substance Use Topics  . Alcohol use: No  . Drug use: No    Family History  Problem Relation Age of Onset  . Coronary artery disease Unknown   . Diabetes Unknown      Review of Systems  Constitutional: positive for fever, chills, but negative diaphoresis, activity change, appetite change, fatigue and unexpected weight change.  HENT: Negative for congestion, sore throat, rhinorrhea, sneezing, trouble swallowing and sinus pressure.  Eyes: Negative for photophobia and visual disturbance.  Respiratory: Negative for cough, chest tightness, shortness of breath, wheezing and stridor.  Cardiovascular: Negative for chest pain, palpitations and leg swelling.  Gastrointestinal: positive for nausea, vomiting,but negative abdominal pain, diarrhea, constipation, blood in stool, abdominal distention  and anal bleeding.  Genitourinary: Negative for dysuria, hematuria, flank pain and difficulty urinating.  Musculoskeletal: Negative for myalgias, back pain, joint swelling, arthralgias and gait problem.  Skin: positive wound to right leg. Negative for color change, pallor, rash and wound.  Neurological: Negative for dizziness, tremors, weakness and light-headedness.  Hematological: Negative for adenopathy. Does not bruise/bleed easily.  Psychiatric/Behavioral: Negative for behavioral problems, confusion, sleep disturbance, dysphoric mood, decreased concentration and agitation.     OBJECTIVE: Temp:  [97.5 F (36.4 C)-98.1 F (36.7 C)] 98.1 F (36.7 C) (12/29 1100) Pulse Rate:  [52-87] 71 (12/29 1026) Resp:  [18-25] 19 (12/29 0400) BP: (103-141)/(61-85) 138/82 (12/29 1300) SpO2:  [92 %-100 %] 97 % (12/29 0900) Weight:  [266 lb 8.6 oz (120.9 kg)] 266 lb 8.6 oz (120.9 kg) (12/29 0332) Physical Exam  Constitutional:  oriented to person, place, and time. Obese female sitting up in bed talking on the phone  appears well-developed and well-nourished. No distress.  HENT: [place]/AT, PERRLA, no scleral icterus Mouth/Throat: Oropharynx is clear and moist. No oropharyngeal exudate.  Cardiovascular: Normal rate, regular rhythm and normal heart sounds. Exam reveals no gallop and no friction rub.  No murmur heard.  Pulmonary/Chest: Effort normal and breath sounds normal. No respiratory distress.  has no wheezes.  Neck = supple, no nuchal rigidity Abdominal: Soft. Bowel sounds are normal.  exhibits no distension. There is no tenderness.  Ext: bilateral trace- +1 edema bilaterally. Skin = right leg wound is wrapped with mild erythema to affected area. She has dry skin to feet bilaterally, no signs of stigmata of endocarditis.  Neurological: alert and oriented to person, place, and time.  Psychiatric: a normal mood and affect.  behavior is normal.    LABS: Results for orders placed or performed during  the hospital encounter of 03/23/17 (from the past 48 hour(s))  CBC with Differential     Status: Abnormal   Collection Time: 03/23/17  7:47 PM  Result Value Ref Range   WBC 11.4 (H) 4.0 - 10.5 K/uL   RBC 4.33 3.87 - 5.11 MIL/uL   Hemoglobin 14.2 12.0 - 15.0 g/dL   HCT 42.7 36.0 - 46.0 %   MCV 98.6 78.0 - 100.0 fL   MCH 32.8 26.0 - 34.0 pg   MCHC 33.3 30.0 - 36.0 g/dL   RDW 13.9 11.5 - 15.5 %   Platelets 139 (L) 150 - 400 K/uL   Neutrophils Relative % 88 %   Neutro Abs 10.0 (H) 1.7 - 7.7 K/uL   Lymphocytes Relative 8 %   Lymphs Abs 1.0 0.7 - 4.0 K/uL   Monocytes Relative 4 %   Monocytes Absolute 0.4 0.1 - 1.0 K/uL   Eosinophils Relative 0 %   Eosinophils Absolute 0.0 0.0 - 0.7 K/uL  Basophils Relative 0 %   Basophils Absolute 0.0 0.0 - 0.1 K/uL  Comprehensive metabolic panel     Status: Abnormal   Collection Time: 03/23/17  7:47 PM  Result Value Ref Range   Sodium 134 (L) 135 - 145 mmol/L   Potassium 4.1 3.5 - 5.1 mmol/L   Chloride 98 (L) 101 - 111 mmol/L   CO2 27 22 - 32 mmol/L   Glucose, Bld 133 (H) 65 - 99 mg/dL   BUN 14 6 - 20 mg/dL   Creatinine, Ser 1.18 (H) 0.44 - 1.00 mg/dL   Calcium 8.6 (L) 8.9 - 10.3 mg/dL   Total Protein 8.3 (H) 6.5 - 8.1 g/dL   Albumin 3.0 (L) 3.5 - 5.0 g/dL   AST 23 15 - 41 U/L   ALT 15 14 - 54 U/L   Alkaline Phosphatase 91 38 - 126 U/L   Total Bilirubin 1.2 0.3 - 1.2 mg/dL   GFR calc non Af Amer 45 (L) >60 mL/min   GFR calc Af Amer 52 (L) >60 mL/min    Comment: (NOTE) The eGFR has been calculated using the CKD EPI equation. This calculation has not been validated in all clinical situations. eGFR's persistently <60 mL/min signify possible Chronic Kidney Disease.    Anion gap 9 5 - 15  Sedimentation rate     Status: Abnormal   Collection Time: 03/23/17  7:47 PM  Result Value Ref Range   Sed Rate 49 (H) 0 - 22 mm/hr  C-reactive protein     Status: Abnormal   Collection Time: 03/23/17  7:47 PM  Result Value Ref Range   CRP 7.1 (H) <1.0  mg/dL  Blood culture (routine x 2)     Status: Abnormal (Preliminary result)   Collection Time: 03/23/17  8:00 PM  Result Value Ref Range   Specimen Description BLOOD RIGHT ANTECUBITAL    Special Requests      BOTTLES DRAWN AEROBIC AND ANAEROBIC Blood Culture adequate volume   Culture  Setup Time      GRAM POSITIVE COCCI IN CHAINS IN BOTH AEROBIC AND ANAEROBIC BOTTLES CRITICAL VALUE NOTED.  VALUE IS CONSISTENT WITH PREVIOUSLY REPORTED AND CALLED VALUE.    Culture STREPTOCOCCUS GROUP G (A)    Report Status PENDING   I-Stat CG4 Lactic Acid, ED     Status: None   Collection Time: 03/23/17  8:20 PM  Result Value Ref Range   Lactic Acid, Venous 1.57 0.5 - 1.9 mmol/L  Blood culture (routine x 2)     Status: Abnormal (Preliminary result)   Collection Time: 03/23/17  8:35 PM  Result Value Ref Range   Specimen Description BLOOD LEFT ANTECUBITAL    Special Requests      BOTTLES DRAWN AEROBIC AND ANAEROBIC Blood Culture adequate volume   Culture  Setup Time      GRAM POSITIVE COCCI IN CHAINS IN BOTH AEROBIC AND ANAEROBIC BOTTLES CRITICAL RESULT CALLED TO, READ BACK BY AND VERIFIED WITH: Truett Perna.D. 10:40 03/24/17 ([email or web address])    Culture (A)     STREPTOCOCCUS GROUP G SUSCEPTIBILITIES TO FOLLOW    Report Status PENDING   Blood Culture ID Panel (Reflexed)     Status: Abnormal   Collection Time: 03/23/17  8:35 PM  Result Value Ref Range   Enterococcus species NOT DETECTED NOT DETECTED   Listeria monocytogenes NOT DETECTED NOT DETECTED   Staphylococcus species NOT DETECTED NOT DETECTED   Staphylococcus aureus NOT DETECTED NOT DETECTED   Streptococcus species DETECTED (A)  NOT DETECTED    Comment: Not Enterococcus species, Streptococcus agalactiae, Streptococcus pyogenes, or Streptococcus pneumoniae. CRITICAL RESULT CALLED TO, READ BACK BY AND VERIFIED WITH: M. Radford Pax Pharm.D. 10:40 03/24/17 ([email or web address])    Streptococcus agalactiae NOT DETECTED NOT DETECTED   Streptococcus pneumoniae  NOT DETECTED NOT DETECTED   Streptococcus pyogenes NOT DETECTED NOT DETECTED   Acinetobacter baumannii NOT DETECTED NOT DETECTED   Enterobacteriaceae species NOT DETECTED NOT DETECTED   Enterobacter cloacae complex NOT DETECTED NOT DETECTED   Escherichia coli NOT DETECTED NOT DETECTED   Klebsiella oxytoca NOT DETECTED NOT DETECTED   Klebsiella pneumoniae NOT DETECTED NOT DETECTED   Proteus species NOT DETECTED NOT DETECTED   Serratia marcescens NOT DETECTED NOT DETECTED   Haemophilus influenzae NOT DETECTED NOT DETECTED   Neisseria meningitidis NOT DETECTED NOT DETECTED   Pseudomonas aeruginosa NOT DETECTED NOT DETECTED   Candida albicans NOT DETECTED NOT DETECTED   Candida glabrata NOT DETECTED NOT DETECTED   Candida krusei NOT DETECTED NOT DETECTED   Candida parapsilosis NOT DETECTED NOT DETECTED   Candida tropicalis NOT DETECTED NOT DETECTED  Protime-INR     Status: Abnormal   Collection Time: 03/23/17  8:36 PM  Result Value Ref Range   Prothrombin Time 37.3 (H) 11.4 - 15.2 seconds   INR 3.82   MRSA PCR Screening     Status: None   Collection Time: 03/24/17  1:49 AM  Result Value Ref Range   MRSA by PCR NEGATIVE NEGATIVE    Comment:        The GeneXpert MRSA Assay (FDA approved for NASAL specimens only), is one component of a comprehensive MRSA colonization surveillance program. It is not intended to diagnose MRSA infection nor to guide or monitor treatment for MRSA infections.   Basic metabolic panel     Status: Abnormal   Collection Time: 03/24/17  2:30 AM  Result Value Ref Range   Sodium 136 135 - 145 mmol/L   Potassium 3.7 3.5 - 5.1 mmol/L   Chloride 100 (L) 101 - 111 mmol/L   CO2 25 22 - 32 mmol/L   Glucose, Bld 137 (H) 65 - 99 mg/dL   BUN 14 6 - 20 mg/dL   Creatinine, Ser 1.30 (H) 0.44 - 1.00 mg/dL   Calcium 7.9 (L) 8.9 - 10.3 mg/dL   GFR calc non Af Amer 40 (L) >60 mL/min   GFR calc Af Amer 47 (L) >60 mL/min    Comment: (NOTE) The eGFR has been calculated  using the CKD EPI equation. This calculation has not been validated in all clinical situations. eGFR's persistently <60 mL/min signify possible Chronic Kidney Disease.    Anion gap 11 5 - 15  CBC     Status: Abnormal   Collection Time: 03/24/17  2:30 AM  Result Value Ref Range   WBC 19.8 (H) 4.0 - 10.5 K/uL   RBC 3.79 (L) 3.87 - 5.11 MIL/uL   Hemoglobin 12.3 12.0 - 15.0 g/dL   HCT 38.0 36.0 - 46.0 %   MCV 100.3 (H) 78.0 - 100.0 fL   MCH 32.5 26.0 - 34.0 pg   MCHC 32.4 30.0 - 36.0 g/dL   RDW 14.2 11.5 - 15.5 %   Platelets 128 (L) 150 - 400 K/uL  Protime-INR     Status: Abnormal   Collection Time: 03/24/17  5:26 AM  Result Value Ref Range   Prothrombin Time 36.2 (H) 11.4 - 15.2 seconds   INR 3.67   Urinalysis, Routine w reflex microscopic  Status: Abnormal   Collection Time: 03/24/17  6:56 PM  Result Value Ref Range   Color, Urine YELLOW YELLOW   APPearance HAZY (A) CLEAR   Specific Gravity, Urine 1.012 1.005 - 1.030   pH 5.0 5.0 - 8.0   Glucose, UA NEGATIVE NEGATIVE mg/dL   Hgb urine dipstick MODERATE (A) NEGATIVE   Bilirubin Urine NEGATIVE NEGATIVE   Ketones, ur NEGATIVE NEGATIVE mg/dL   Protein, ur NEGATIVE NEGATIVE mg/dL   Nitrite NEGATIVE NEGATIVE   Leukocytes, UA LARGE (A) NEGATIVE   RBC / HPF 6-30 0 - 5 RBC/hpf   WBC, UA TOO NUMEROUS TO COUNT 0 - 5 WBC/hpf   Bacteria, UA RARE (A) NONE SEEN   Squamous Epithelial / LPF 0-5 (A) NONE SEEN   WBC Clumps PRESENT    Mucus PRESENT   Protime-INR     Status: Abnormal   Collection Time: 03/25/17  7:49 AM  Result Value Ref Range   Prothrombin Time 41.8 (H) 11.4 - 15.2 seconds   INR 4.42 (HH)     Comment: REPEATED TO VERIFY CRITICAL RESULT CALLED TO, READ BACK BY AND VERIFIED WITH: C HUTCHINS,RN 0842 03/25/17 D BRADLEY   CBC     Status: Abnormal   Collection Time: 03/25/17  7:49 AM  Result Value Ref Range   WBC 7.6 4.0 - 10.5 K/uL   RBC 3.76 (L) 3.87 - 5.11 MIL/uL   Hemoglobin 11.7 (L) 12.0 - 15.0 g/dL   HCT 37.1  36.0 - 46.0 %   MCV 98.7 78.0 - 100.0 fL   MCH 31.1 26.0 - 34.0 pg   MCHC 31.5 30.0 - 36.0 g/dL   RDW 14.1 11.5 - 15.5 %   Platelets 118 (L) 150 - 400 K/uL    Comment: REPEATED TO VERIFY SPECIMEN CHECKED FOR CLOTS PLATELET COUNT CONFIRMED BY SMEAR   Basic metabolic panel     Status: Abnormal   Collection Time: 03/25/17  7:49 AM  Result Value Ref Range   Sodium 139 135 - 145 mmol/L   Potassium 4.0 3.5 - 5.1 mmol/L   Chloride 104 101 - 111 mmol/L   CO2 28 22 - 32 mmol/L   Glucose, Bld 101 (H) 65 - 99 mg/dL   BUN 19 6 - 20 mg/dL   Creatinine, Ser 1.76 (H) 0.44 - 1.00 mg/dL   Calcium 8.3 (L) 8.9 - 10.3 mg/dL   GFR calc non Af Amer 28 (L) >60 mL/min   GFR calc Af Amer 32 (L) >60 mL/min    Comment: (NOTE) The eGFR has been calculated using the CKD EPI equation. This calculation has not been validated in all clinical situations. eGFR's persistently <60 mL/min signify possible Chronic Kidney Disease.    Anion gap 7 5 - 15    MICRO: 12/27 blood cx streptococcal group G in 2 sets 12/29 blood cx pending  IMAGING: Dg Chest 2 View  Result Date: 03/23/2017 CLINICAL DATA:  Acute onset of fever.  Right lower leg infection. EXAM: CHEST  2 VIEW COMPARISON:  Chest radiograph performed 03/04/2017 FINDINGS: The lungs are well-aerated. Vascular congestion is noted. There is no evidence of focal opacification, pleural effusion or pneumothorax. The heart is mildly enlarged. No acute osseous abnormalities are seen. IMPRESSION: Vascular congestion and mild cardiomegaly. Lungs remain grossly clear. Electronically Signed   By: Garald Balding M.D.   On: 03/23/2017 22:12   Dg Tibia/fibula Right  Result Date: 03/23/2017 CLINICAL DATA:  Acute onset of fever and subacute onset of right lower leg  infection. EXAM: RIGHT TIBIA AND FIBULA - 2 VIEW COMPARISON:  Right foot radiographs performed 10/21/2009 FINDINGS: There is no evidence of osseous erosion. No fracture or dislocation is seen. The knee joint is  unremarkable in appearance. No knee joint effusion is identified. The ankle mortise is incompletely assessed, but appears grossly unremarkable. Soft tissue swelling is noted about the proximal lower leg. Postoperative change is seen at the proximal lower leg. IMPRESSION: No evidence of osseous erosion. Soft tissue swelling noted about the proximal lower leg. Electronically Signed   By: Garald Balding M.D.   On: 03/23/2017 22:13    Assessment/Plan:  71yo F with history of PAD, ischemic right leg s/p embolectomy c/b MRSA wound s/p I x D in early December who now found to have streptococcal G bacteremia possibly secondary to cellulitis vs unknown source  - continue on ceftriaxone 2gm iv daily to treat bacteremia - recommend repeat blood cx - recommend to get TTE for now. If repeat blood cx are positive, recommend to get TEE to rule out endocarditis - source is thought possible due to right leg though not overwhelming appearing cellulitis - minimum of 2 wk of abtx treatment but may be longer pending studies  aki = not sure if it is due to vancomycin but recommend to give ivf to help return back to baseline  Health maintenance = will check hep C Ab  Dr Tommy Medal to provide further recs.

## 2017-03-25 NOTE — Progress Notes (Signed)
Patient arrived from [facility] ICU via wheelchair. Vitals taken, tele box 19 put on and called to CCMD. Patient wished to sit in recliner. Skin check done and patient settled. Will continue to follow as needed.

## 2017-03-25 NOTE — Progress Notes (Signed)
Critical Value called in by lab, message Dr Broadus John with results.  INR 4.42

## 2017-03-25 NOTE — Progress Notes (Signed)
Pharmacy Antibiotic Note  [name] is a 71 y.o. female admitted on 03/23/2017 with possible sepsis 2/2 cellulitis, blood cx resulted strep spp, now to narrow ABX.  Pharmacy has been consulted for Rocephin dosing.  Plan: Rocephin 2g IV Q24H.  Height: 5\' 6"  (167.6 cm) Weight: 266 lb 8.6 oz (120.9 kg) IBW/kg (Calculated) : 59.3  Temp (24hrs), Avg:97.9 F (36.6 C), Min:97.5 F (36.4 C), Max:98.2 F (36.8 C)  Recent Labs  Lab 03/23/17 1947 03/23/17 2020 03/24/17 0230  WBC 11.4*  --  19.8*  CREATININE 1.18*  --  1.30*  LATICACIDVEN  --  1.57  --     Estimated Creatinine Clearance: 52.6 mL/min (A) (by C-G formula based on SCr of 1.3 mg/dL (H)).    Allergies  Allergen Reactions  . Heparin Hives    Patient attests severe allergy- rash all over, severe itching, unable to take heparin  . Other Itching and Rash    "Sheets and Linens used by Zacarias Pontes"    Thank you for allowing pharmacy to be a part of this patient's care.  Wynona Neat, PharmD, BCPS  03/25/2017 7:36 AM

## 2017-03-25 NOTE — Plan of Care (Signed)
Pt wound is healing, dressing changed, discussed with pt risk of infection and prevention. Will continue to monitor wound.

## 2017-03-25 NOTE — Progress Notes (Signed)
PROGRESS NOTE    [name]  XEN:[id number] DOB: [birth year] DOA: 03/23/2017 PCP: Patient, No Pcp Per  Brief Narrative:[name] is a 71 year old female with history of atrial fibrillation, positive lupus anticoagulant, embolic stroke, DVT status post IVC filter, she was found to have an acutely ischemic right foot on 11/21 subsequently taken to the OR and underwent embolectomy via medial below-knee incision on 11/22, subsequently went back to the OR 12/18 for MRSA abscess with large amount of purulence evacuated and wound VAC placement in the below knee popliteal space. -Readmitted yesterday morning with generalized weakness and fever of 105  Assessment & Plan:   Fever/SIRS -With group G strep bacteremia -Source is not completely clear at this time mild cellulitis around the recent surgical site as a possibility -Change of vancomycin and Zosyn to IV ceftriaxone today -Repeat blood cultures -Check 2-D echocardiogram -We'll consult infectious disease -Appreciate vascular surgery consult, right popliteal surgical wounds not felt to be the source per VVS -Stop IV fluids today  Paroxysmal atrial fibrillation -Heart rate controlled, continue Cardizem and metoprolol -Coumadin per pharmacy, INR supratherapeutic, hold today's dose  History of DVT and pulmonary embolism, as well as embolic stroke and history of lupus anticoagulant -Continue Coumadin per pharmacy  History of MRSA right leg abscess -Status post incision and debridement -s/p 1 week of Vanc inpatient and then Doxy for 7days at DC -VVS following, wound appears clean now  Acute ischemic limb s/p embolectomy -on 10/31  Chronic diastolic CHF -compensated, resume lasix in 1-2days  DVT prophylaxis: on coumadin Code Status: Full Code Family Communication: None at bedside Disposition Plan: Tx to floor  Consultants:   Vascular surgery  Infectious disease   Procedures:   Antimicrobials:  Antibiotics Given (last  72 hours)    Date/Time Action Medication Dose Rate   03/23/17 2021 New Bag/Given   piperacillin-tazobactam (ZOSYN) IVPB 3.375 g 3.375 g 100 mL/hr   03/23/17 2021 New Bag/Given   vancomycin (VANCOCIN) 2,000 mg in sodium chloride 0.9 % 500 mL IVPB 2,000 mg 250 mL/hr   03/24/17 0447 New Bag/Given   piperacillin-tazobactam (ZOSYN) IVPB 3.375 g 3.375 g 12.5 mL/hr   03/24/17 1436 New Bag/Given   piperacillin-tazobactam (ZOSYN) IVPB 3.375 g 3.375 g 12.5 mL/hr   03/24/17 2037 New Bag/Given   vancomycin (VANCOCIN) 1,500 mg in sodium chloride 0.9 % 500 mL IVPB 1,500 mg 250 mL/hr   03/24/17 2037 New Bag/Given   piperacillin-tazobactam (ZOSYN) IVPB 3.375 g 3.375 g 12.5 mL/hr   03/25/17 0428 New Bag/Given   piperacillin-tazobactam (ZOSYN) IVPB 3.375 g 3.375 g 12.5 mL/hr   03/25/17 1025 New Bag/Given   cefTRIAXone (ROCEPHIN) 2 g in dextrose 5 % 50 mL IVPB 2 g 100 mL/hr      Subjective: -Was much better, back to her usual baseline, not thinking back she is wondering if she had a urinary infection K she had some flank pain 2 days ago  Objective: Vitals:   03/25/17 0300 03/25/17 0332 03/25/17 0400 03/25/17 1026  BP: 133/71  120/72 137/82  Pulse: 68  71 71  Resp: 20  19   Temp:  98 F (36.7 C)    TempSrc:  Axillary    SpO2: 95%  95%   Weight:  120.9 kg (266 lb 8.6 oz)    Height:        Intake/Output Summary (Last 24 hours) at 03/25/2017 1135 Last data filed at 03/25/2017 0930 Gross per 24 hour  Intake -  Output 600 ml  Net -  600 ml   Filed Weights   03/23/17 1906 03/24/17 0200 03/25/17 0332  Weight: 127 kg (280 lb) 119.7 kg (263 lb 14.3 oz) 120.9 kg (266 lb 8.6 oz)    Examination:  General exam: Appears calm and comfortable, no distress  Respiratory system: Clear to auscultation. Respiratory effort normal. Cardiovascular system: S1 & S2 heard, RRR.  Gastrointestinal system: Abdomen is nondistended, soft and nontender.Normal bowel sounds heard. Central nervous system: Alert and  oriented. No focal neurological deficits. Extremities: Right leg popliteal fossa with deep surgical wound with pink granulation tissue, mild surrounding erythema is much improved Skin: Surgical wound is noted above Psychiatry: Judgement and insight appear normal. Mood & affect appropriate.     Data Reviewed:   CBC: Recent Labs  Lab 03/23/17 1947 03/24/17 0230 03/25/17 0749  WBC 11.4* 19.8* 7.6  NEUTROABS 10.0*  --   --   HGB 14.2 12.3 11.7*  HCT 42.7 38.0 37.1  MCV 98.6 100.3* 98.7  PLT 139* 128* 623*   Basic Metabolic Panel: Recent Labs  Lab 03/23/17 1947 03/24/17 0230 03/25/17 0749  NA 134* 136 139  K 4.1 3.7 4.0  CL 98* 100* 104  CO2 27 25 28   GLUCOSE 133* 137* 101*  BUN 14 14 19   CREATININE 1.18* 1.30* 1.76*  CALCIUM 8.6* 7.9* 8.3*   GFR: Estimated Creatinine Clearance: 38.8 mL/min (A) (by C-G formula based on SCr of 1.76 mg/dL (H)). Liver Function Tests: Recent Labs  Lab 03/23/17 1947  AST 23  ALT 15  ALKPHOS 91  BILITOT 1.2  PROT 8.3*  ALBUMIN 3.0*   No results for input(s): LIPASE, AMYLASE in the last 168 hours. No results for input(s): AMMONIA in the last 168 hours. Coagulation Profile: Recent Labs  Lab 03/23/17 03/23/17 2036 03/24/17 0526 03/25/17 0749  INR 5.0 3.82 3.67 4.42*   Cardiac Enzymes: No results for input(s): CKTOTAL, CKMB, CKMBINDEX, TROPONINI in the last 168 hours. BNP (last 3 results) No results for input(s): PROBNP in the last 8760 hours. HbA1C: No results for input(s): HGBA1C in the last 72 hours. CBG: No results for input(s): GLUCAP in the last 168 hours. Lipid Profile: No results for input(s): CHOL, HDL, LDLCALC, TRIG, CHOLHDL, LDLDIRECT in the last 72 hours. Thyroid Function Tests: No results for input(s): TSH, T4TOTAL, FREET4, T3FREE, THYROIDAB in the last 72 hours. Anemia Panel: No results for input(s): VITAMINB12, FOLATE, FERRITIN, TIBC, IRON, RETICCTPCT in the last 72 hours. Urine analysis:    Component Value  Date/Time   COLORURINE YELLOW 03/24/2017 1856   APPEARANCEUR HAZY (A) 03/24/2017 1856   LABSPEC 1.012 03/24/2017 1856   PHURINE 5.0 03/24/2017 1856   GLUCOSEU NEGATIVE 03/24/2017 1856   HGBUR MODERATE (A) 03/24/2017 1856   BILIRUBINUR NEGATIVE 03/24/2017 1856   KETONESUR NEGATIVE 03/24/2017 1856   PROTEINUR NEGATIVE 03/24/2017 1856   UROBILINOGEN 1.0 07/07/2010 1500   NITRITE NEGATIVE 03/24/2017 1856   LEUKOCYTESUR LARGE (A) 03/24/2017 1856   Sepsis Labs: @LABRCNTIP (procalcitonin:4,lacticidven:4)  ) Recent Results (from the past 240 hour(s))  Blood culture (routine x 2)     Status: Abnormal (Preliminary result)   Collection Time: 03/23/17  8:00 PM  Result Value Ref Range Status   Specimen Description BLOOD RIGHT ANTECUBITAL  Final   Special Requests   Final    BOTTLES DRAWN AEROBIC AND ANAEROBIC Blood Culture adequate volume   Culture  Setup Time   Final    GRAM POSITIVE COCCI IN CHAINS IN BOTH AEROBIC AND ANAEROBIC BOTTLES CRITICAL VALUE NOTED.  VALUE IS CONSISTENT WITH PREVIOUSLY REPORTED AND CALLED VALUE.    Culture STREPTOCOCCUS GROUP G (A)  Final   Report Status PENDING  Incomplete  Blood culture (routine x 2)     Status: Abnormal (Preliminary result)   Collection Time: 03/23/17  8:35 PM  Result Value Ref Range Status   Specimen Description BLOOD LEFT ANTECUBITAL  Final   Special Requests   Final    BOTTLES DRAWN AEROBIC AND ANAEROBIC Blood Culture adequate volume   Culture  Setup Time   Final    GRAM POSITIVE COCCI IN CHAINS IN BOTH AEROBIC AND ANAEROBIC BOTTLES CRITICAL RESULT CALLED TO, READ BACK BY AND VERIFIED WITH: Truett Perna.D. 10:40 03/24/17 ([email or web address])    Culture (A)  Final    STREPTOCOCCUS GROUP G SUSCEPTIBILITIES TO FOLLOW    Report Status PENDING  Incomplete  Blood Culture ID Panel (Reflexed)     Status: Abnormal   Collection Time: 03/23/17  8:35 PM  Result Value Ref Range Status   Enterococcus species NOT DETECTED NOT DETECTED Final   Listeria  monocytogenes NOT DETECTED NOT DETECTED Final   Staphylococcus species NOT DETECTED NOT DETECTED Final   Staphylococcus aureus NOT DETECTED NOT DETECTED Final   Streptococcus species DETECTED (A) NOT DETECTED Final    Comment: Not Enterococcus species, Streptococcus agalactiae, Streptococcus pyogenes, or Streptococcus pneumoniae. CRITICAL RESULT CALLED TO, READ BACK BY AND VERIFIED WITH: M. Radford Pax Pharm.D. 10:40 03/24/17 ([email or web address])    Streptococcus agalactiae NOT DETECTED NOT DETECTED Final   Streptococcus pneumoniae NOT DETECTED NOT DETECTED Final   Streptococcus pyogenes NOT DETECTED NOT DETECTED Final   Acinetobacter baumannii NOT DETECTED NOT DETECTED Final   Enterobacteriaceae species NOT DETECTED NOT DETECTED Final   Enterobacter cloacae complex NOT DETECTED NOT DETECTED Final   Escherichia coli NOT DETECTED NOT DETECTED Final   Klebsiella oxytoca NOT DETECTED NOT DETECTED Final   Klebsiella pneumoniae NOT DETECTED NOT DETECTED Final   Proteus species NOT DETECTED NOT DETECTED Final   Serratia marcescens NOT DETECTED NOT DETECTED Final   Haemophilus influenzae NOT DETECTED NOT DETECTED Final   Neisseria meningitidis NOT DETECTED NOT DETECTED Final   Pseudomonas aeruginosa NOT DETECTED NOT DETECTED Final   Candida albicans NOT DETECTED NOT DETECTED Final   Candida glabrata NOT DETECTED NOT DETECTED Final   Candida krusei NOT DETECTED NOT DETECTED Final   Candida parapsilosis NOT DETECTED NOT DETECTED Final   Candida tropicalis NOT DETECTED NOT DETECTED Final  MRSA PCR Screening     Status: None   Collection Time: 03/24/17  1:49 AM  Result Value Ref Range Status   MRSA by PCR NEGATIVE NEGATIVE Final    Comment:        The GeneXpert MRSA Assay (FDA approved for NASAL specimens only), is one component of a comprehensive MRSA colonization surveillance program. It is not intended to diagnose MRSA infection nor to guide or monitor treatment for MRSA infections.           Radiology Studies: Dg Chest 2 View  Result Date: 03/23/2017 CLINICAL DATA:  Acute onset of fever.  Right lower leg infection. EXAM: CHEST  2 VIEW COMPARISON:  Chest radiograph performed 03/04/2017 FINDINGS: The lungs are well-aerated. Vascular congestion is noted. There is no evidence of focal opacification, pleural effusion or pneumothorax. The heart is mildly enlarged. No acute osseous abnormalities are seen. IMPRESSION: Vascular congestion and mild cardiomegaly. Lungs remain grossly clear. Electronically Signed   By: Garald Balding M.D.   On: 03/23/2017 22:12  Dg Tibia/fibula Right  Result Date: 03/23/2017 CLINICAL DATA:  Acute onset of fever and subacute onset of right lower leg infection. EXAM: RIGHT TIBIA AND FIBULA - 2 VIEW COMPARISON:  Right foot radiographs performed 10/21/2009 FINDINGS: There is no evidence of osseous erosion. No fracture or dislocation is seen. The knee joint is unremarkable in appearance. No knee joint effusion is identified. The ankle mortise is incompletely assessed, but appears grossly unremarkable. Soft tissue swelling is noted about the proximal lower leg. Postoperative change is seen at the proximal lower leg. IMPRESSION: No evidence of osseous erosion. Soft tissue swelling noted about the proximal lower leg. Electronically Signed   By: Garald Balding M.D.   On: 03/23/2017 22:13        Scheduled Meds: . aspirin  81 mg Oral Daily  . diltiazem  300 mg Oral Daily  . docusate sodium  200 mg Oral BID  . metoprolol tartrate  12.5 mg Oral BID  . potassium chloride  10 mEq Oral Daily  . Warfarin - Pharmacist Dosing Inpatient   Does not apply q1800   Continuous Infusions: . cefTRIAXone (ROCEPHIN)  IV Stopped (03/25/17 1055)     LOS: 1 day    Time spent: 54min    Domenic Polite, MD [facility] Page via [email or web address], password [email or web address] After 7PM please contact night-coverage  03/25/2017, 11:35 AM

## 2017-03-26 ENCOUNTER — Inpatient Hospital Stay (HOSPITAL_COMMUNITY): Payer: Medicare Other

## 2017-03-26 DIAGNOSIS — Z7901 Long term (current) use of anticoagulants: Secondary | ICD-10-CM

## 2017-03-26 DIAGNOSIS — R7881 Bacteremia: Secondary | ICD-10-CM

## 2017-03-26 DIAGNOSIS — L03115 Cellulitis of right lower limb: Secondary | ICD-10-CM

## 2017-03-26 DIAGNOSIS — I743 Embolism and thrombosis of arteries of the lower extremities: Secondary | ICD-10-CM

## 2017-03-26 DIAGNOSIS — Z8614 Personal history of Methicillin resistant Staphylococcus aureus infection: Secondary | ICD-10-CM

## 2017-03-26 DIAGNOSIS — B955 Unspecified streptococcus as the cause of diseases classified elsewhere: Secondary | ICD-10-CM

## 2017-03-26 LAB — CULTURE, BLOOD (ROUTINE X 2)
SPECIAL REQUESTS: ADEQUATE
Special Requests: ADEQUATE

## 2017-03-26 LAB — BASIC METABOLIC PANEL
ANION GAP: 8 (ref 5–15)
BUN: 18 mg/dL (ref 6–20)
CO2: 25 mmol/L (ref 22–32)
Calcium: 7.9 mg/dL — ABNORMAL LOW (ref 8.9–10.3)
Chloride: 104 mmol/L (ref 101–111)
Creatinine, Ser: 1.55 mg/dL — ABNORMAL HIGH (ref 0.44–1.00)
GFR, EST AFRICAN AMERICAN: 38 mL/min — AB (ref >60)
GFR, EST NON AFRICAN AMERICAN: 33 mL/min — AB (ref >60)
GLUCOSE: 90 mg/dL (ref 65–99)
POTASSIUM: 3.8 mmol/L (ref 3.5–5.1)
Sodium: 137 mmol/L (ref 135–145)

## 2017-03-26 LAB — CBC
HCT: 34.6 % — ABNORMAL LOW (ref 36.0–46.0)
Hemoglobin: 11 g/dL — ABNORMAL LOW (ref 12.0–15.0)
MCH: 32 pg (ref 26.0–34.0)
MCHC: 31.8 g/dL (ref 30.0–36.0)
MCV: 100.6 fL — AB (ref 78.0–100.0)
PLATELETS: 123 10*3/uL — AB (ref 150–400)
RBC: 3.44 MIL/uL — AB (ref 3.87–5.11)
RDW: 14.3 % (ref 11.5–15.5)
WBC: 5.5 10*3/uL (ref 4.0–10.5)

## 2017-03-26 LAB — PROTIME-INR
INR: 4.24 — AB
Prothrombin Time: 40.5 seconds — ABNORMAL HIGH (ref 11.4–15.2)

## 2017-03-26 LAB — ECHOCARDIOGRAM COMPLETE
Height: 66 in
Weight: 4345.71 oz

## 2017-03-26 MED ORDER — PERFLUTREN LIPID MICROSPHERE
1.0000 mL | INTRAVENOUS | Status: AC | PRN
  Administered 2017-03-26: 2 mL via INTRAVENOUS
  Filled 2017-03-26: qty 10

## 2017-03-26 NOTE — Progress Notes (Signed)
[name] for Warfarin Indication: Afib + h/o PE + h/o popliteal artery embolus  Allergies  Allergen Reactions  . Heparin Hives    Patient attests severe allergy- rash all over, severe itching, unable to take heparin  . Other Itching and Rash    "Sheets and Linens used by [facility]"    Labs: Recent Labs    03/24/17 0230 03/24/17 0526 03/25/17 0749 03/26/17 0348  HGB 12.3  --  11.7* 11.0*  HCT 38.0  --  37.1 34.6*  PLT 128*  --  118* 123*  LABPROT  --  36.2* 41.8* 40.5*  INR  --  3.67 4.42* 4.24*  CREATININE 1.30*  --  1.76* 1.55*    Estimated Creatinine Clearance: 44.6 mL/min (A) (by C-G formula based on SCr of 1.55 mg/dL (H)).   Assessment: 71yo female admitted w/ cellulitis associated w/ fever/fatigue/N/V, to continue Warfarin for Afib + h/o PE + h/o popliteal artery embolus; current INR above goal (higher goal given multiple indications). Hemoglobin and platelets are stable. No documented concern of bleeding at this time. INR elevated today >4.   Goal of Therapy:  INR 2.5-3.5   Plan:  Hold warfarin today for elevated INR  Daily INR, CBC  Monitor for s/sx of bleeding  Jalene Mullet, Pharm.D. PGY1 Pharmacy Resident 03/26/2017 8:18 AM Main Pharmacy: [phone]

## 2017-03-26 NOTE — Progress Notes (Signed)
      INFECTIOUS DISEASE ATTENDING ADDENDUM:   Date: 03/26/2017  Patient name: [name] record number: [id number]  Date of birth: 1946/03/07    MRI without evidence for osteo or abscess in leg  TTE without vegetation  Cultures taken on 03/25/17 no growth  If her blood cultures from 03/25/17 remain NGTD can insert PICC and treat her for 2 weeks of IV ceftriaxone with following plan  Diagnosis: Group G streptococcal bacteremia due to wound  Culture Result: Group G streptococcus  Allergies  Allergen Reactions  . Heparin Hives    Patient attests severe allergy- rash all over, severe itching, unable to take heparin  . Other Itching and Rash    "Sheets and Linens used by [facility]"    OPAT Orders Discharge antibiotics: IV ceftriaxone 2 grams daily  Duration: 2 weeks from date of negative blood cultures  End Date:  January 11th, 2019  [facility] Care Per Protocol:  Labs weekly while on IV antibiotics: _x_ CBC with differential x__ BMP   _x_ Please pull PIC at completion of IV antibiotics __ Please leave PIC in place until doctor has seen patient or been notified  Fax weekly labs to [phone]  Clinic Follow Up Appt:  Next 2-3 weeks  I will sign off for now please call with further questions.  Alcide Evener 03/26/2017, 6:54 PM

## 2017-03-26 NOTE — Progress Notes (Addendum)
  Progress Note    03/26/2017 9:46 AM * No surgery found *  Subjective:  Says she feels good  Afebrile VSS  Vitals:   03/26/17 0907 03/26/17 0909  BP: 124/73 124/73  Pulse:  81  Resp:    Temp:    SpO2:      Physical Exam: Incisions:  Wound is clean with good granulation tissue;  No purulence/evidence of infection   CBC    Component Value Date/Time   WBC 5.5 03/26/2017 0348   RBC 3.44 (L) 03/26/2017 0348   HGB 11.0 (L) 03/26/2017 0348   HCT 34.6 (L) 03/26/2017 0348   PLT 123 (L) 03/26/2017 0348   MCV 100.6 (H) 03/26/2017 0348   MCH 32.0 03/26/2017 0348   MCHC 31.8 03/26/2017 0348   RDW 14.3 03/26/2017 0348   LYMPHSABS 1.0 03/23/2017 1947   MONOABS 0.4 03/23/2017 1947   EOSABS 0.0 03/23/2017 1947   BASOSABS 0.0 03/23/2017 1947    BMET    Component Value Date/Time   NA 137 03/26/2017 0348   K 3.8 03/26/2017 0348   CL 104 03/26/2017 0348   CO2 25 03/26/2017 0348   GLUCOSE 90 03/26/2017 0348   BUN 18 03/26/2017 0348   CREATININE 1.55 (H) 03/26/2017 0348   CALCIUM 7.9 (L) 03/26/2017 0348   GFRNONAA 33 (L) 03/26/2017 0348   GFRAA 38 (L) 03/26/2017 0348    INR    Component Value Date/Time   INR 4.24 (HH) 03/26/2017 0348   INR 2.2 05/07/2010 1039     Intake/Output Summary (Last 24 hours) at 03/26/2017 0946 Last data filed at 03/26/2017 2774 Gross per 24 hour  Intake 75 ml  Output 200 ml  Net -125 ml     Assessment:  71 y.o. female is s/p:  evacuation of a large amount of purulence on 03/05/17    Plan: -pt feels well today and has been afebrile -dressing removed and wound looks great with good granulation tissue.  No evidence of infection. -pt found to have streptococcal bacteremia - abx per ID.  TTE ordered.  May need TEE if repeat BC are + -will most likely have vac placed to wound tomorrow.    Leontine Locket, PA-C [facility] [phone] 03/26/2017 9:46 AM  Addendum  I have independently interviewed and  examined the patient, and I agree with the physician assistant's findings.  R pop exposure looks clean with good granulation.    - replace VAC tomorrow   Adele Barthel, MD, [place] [facility] [place] Office: [phone] Pager: [phone]  03/26/2017, 9:58 AM

## 2017-03-26 NOTE — Progress Notes (Signed)
PROGRESS NOTE    [name]  ZOX:[id number] DOB: 1945-09-16 DOA: 03/23/2017 PCP: Patient, No Pcp Per  Brief Narrative:Ms. Yetman is a 71 year old female with history of atrial fibrillation, positive lupus anticoagulant, embolic stroke, DVT status post IVC filter, she was found to have an acutely ischemic right foot on 11/21 subsequently taken to the OR and underwent embolectomy via medial below-knee incision on 11/22, subsequently went back to the OR 12/18 for MRSA abscess with large amount of purulence evacuated and wound VAC placement in the below knee popliteal space. -Readmitted yesterday morning with generalized weakness and fever of 105  Assessment & Plan:   Fever/group G streptococcal bacteremia -With group G strep bacteremia -Source is not completely clear , mild cellulitis around the recent surgical site most likely possibility -Continue IV ceftriaxone -Repeat blood cultures-pending -2-D echocardiogram-pending -Clinically appreciate infectious disease input , will at least need 2 weeks of IV antibiotics -Appreciate vascular surgery consult, right popliteal surgical wounds felt to be clean and less likely source per VVS  Paroxysmal atrial fibrillation -Heart rate controlled, continue Cardizem and metoprolol -Coumadin per pharmacy, INR supratherapeutic, hold today's dose  History of DVT and pulmonary embolism, as well as embolic stroke and history of lupus anticoagulant -Continue Coumadin per pharmacy  History of MRSA right leg abscess -Status post incision and debridement -s/p 1 week of Vanc inpatient and then Doxy for 7days at [facility] -VVS following, wound appears clean now  Acute ischemic limb s/p embolectomy -on 81/19  Chronic diastolic CHF -compensated, resume lasix tomorrow  DVT prophylaxis: on coumadin Code Status: Full Code Family Communication: None at bedside Disposition Plan: home pending stability, and clearance of bacteremia   Consultants:    Vascular surgery  Infectious disease   Procedures:   Antimicrobials:  Antibiotics Given (last 72 hours)    Date/Time Action Medication Dose Rate   03/23/17 2021 New Bag/Given   piperacillin-tazobactam (ZOSYN) IVPB 3.375 g 3.375 g 100 mL/hr   03/23/17 2021 New Bag/Given   vancomycin (VANCOCIN) 2,000 mg in sodium chloride 0.9 % 500 mL IVPB 2,000 mg 250 mL/hr   03/24/17 0447 New Bag/Given   piperacillin-tazobactam (ZOSYN) IVPB 3.375 g 3.375 g 12.5 mL/hr   03/24/17 1436 New Bag/Given   piperacillin-tazobactam (ZOSYN) IVPB 3.375 g 3.375 g 12.5 mL/hr   03/24/17 2037 New Bag/Given   vancomycin (VANCOCIN) 1,500 mg in sodium chloride 0.9 % 500 mL IVPB 1,500 mg 250 mL/hr   03/24/17 2037 New Bag/Given   piperacillin-tazobactam (ZOSYN) IVPB 3.375 g 3.375 g 12.5 mL/hr   03/25/17 0428 New Bag/Given   piperacillin-tazobactam (ZOSYN) IVPB 3.375 g 3.375 g 12.5 mL/hr   03/25/17 1025 New Bag/Given   cefTRIAXone (ROCEPHIN) 2 g in dextrose 5 % 50 mL IVPB 2 g 100 mL/hr   03/26/17 0903 New Bag/Given   cefTRIAXone (ROCEPHIN) 2 g in dextrose 5 % 50 mL IVPB 2 g 100 mL/hr      Subjective: -Feels much better, has no complaints or symptoms at this time   Objective: Vitals:   03/25/17 2056 03/26/17 0558 03/26/17 0907 03/26/17 0909  BP: (!) 153/62 139/84 124/73 124/73  Pulse: 64 62  81  Resp: 19 18    Temp: 97.7 F (36.5 C) 97.7 F (36.5 C)    TempSrc:      SpO2: 98% 97%    Weight:  123.2 kg (271 lb 9.7 oz)    Height:        Intake/Output Summary (Last 24 hours) at 03/26/2017 1153 Last data  filed at 03/26/2017 7322 Gross per 24 hour  Intake 75 ml  Output 200 ml  Net -125 ml   Filed Weights   03/24/17 0200 03/25/17 0332 03/26/17 0558  Weight: 119.7 kg (263 lb 14.3 oz) 120.9 kg (266 lb 8.6 oz) 123.2 kg (271 lb 9.7 oz)    Examination:  Gen: Awake, Alert, Oriented X 3, no distress, pleasant HEENT: PERRLA, Neck supple, no JVD Lungs: Good air movement bilaterally, CTAB CVS: RRR,No  Gallops,Rubs or new Murmurs Abd: soft, Non tender, non distended, BS present Extremities: Right leg popliteal foci with deep surgical wound with pink granulation tissue and improved surrounding erythema Skin: Surgical wound as noted above Psychiatry: Judgement and insight appear normal. Mood & affect appropriate.     Data Reviewed:   CBC: Recent Labs  Lab 03/23/17 1947 03/24/17 0230 03/25/17 0749 03/26/17 0348  WBC 11.4* 19.8* 7.6 5.5  NEUTROABS 10.0*  --   --   --   HGB 14.2 12.3 11.7* 11.0*  HCT 42.7 38.0 37.1 34.6*  MCV 98.6 100.3* 98.7 100.6*  PLT 139* 128* 118* 025*   Basic Metabolic Panel: Recent Labs  Lab 03/23/17 1947 03/24/17 0230 03/25/17 0749 03/26/17 0348  NA 134* 136 139 137  K 4.1 3.7 4.0 3.8  CL 98* 100* 104 104  CO2 27 25 28 25   GLUCOSE 133* 137* 101* 90  BUN 14 14 19 18   CREATININE 1.18* 1.30* 1.76* 1.55*  CALCIUM 8.6* 7.9* 8.3* 7.9*   GFR: Estimated Creatinine Clearance: 44.6 mL/min (A) (by C-G formula based on SCr of 1.55 mg/dL (H)). Liver Function Tests: Recent Labs  Lab 03/23/17 1947  AST 23  ALT 15  ALKPHOS 91  BILITOT 1.2  PROT 8.3*  ALBUMIN 3.0*   No results for input(s): LIPASE, AMYLASE in the last 168 hours. No results for input(s): AMMONIA in the last 168 hours. Coagulation Profile: Recent Labs  Lab 03/23/17 03/23/17 2036 03/24/17 0526 03/25/17 0749 03/26/17 0348  INR 5.0 3.82 3.67 4.42* 4.24*   Cardiac Enzymes: No results for input(s): CKTOTAL, CKMB, CKMBINDEX, TROPONINI in the last 168 hours. BNP (last 3 results) No results for input(s): PROBNP in the last 8760 hours. HbA1C: No results for input(s): HGBA1C in the last 72 hours. CBG: No results for input(s): GLUCAP in the last 168 hours. Lipid Profile: No results for input(s): CHOL, HDL, LDLCALC, TRIG, CHOLHDL, LDLDIRECT in the last 72 hours. Thyroid Function Tests: No results for input(s): TSH, T4TOTAL, FREET4, T3FREE, THYROIDAB in the last 72 hours. Anemia  Panel: No results for input(s): VITAMINB12, FOLATE, FERRITIN, TIBC, IRON, RETICCTPCT in the last 72 hours. Urine analysis:    Component Value Date/Time   COLORURINE YELLOW 03/24/2017 1856   APPEARANCEUR HAZY (A) 03/24/2017 1856   LABSPEC 1.012 03/24/2017 1856   PHURINE 5.0 03/24/2017 1856   GLUCOSEU NEGATIVE 03/24/2017 1856   HGBUR MODERATE (A) 03/24/2017 1856   BILIRUBINUR NEGATIVE 03/24/2017 1856   KETONESUR NEGATIVE 03/24/2017 1856   PROTEINUR NEGATIVE 03/24/2017 1856   UROBILINOGEN 1.0 07/07/2010 1500   NITRITE NEGATIVE 03/24/2017 1856   LEUKOCYTESUR LARGE (A) 03/24/2017 1856   Sepsis Labs: @LABRCNTIP (procalcitonin:4,lacticidven:4)  ) Recent Results (from the past 240 hour(s))  Blood culture (routine x 2)     Status: Abnormal   Collection Time: 03/23/17  8:00 PM  Result Value Ref Range Status   Specimen Description BLOOD RIGHT ANTECUBITAL  Final   Special Requests   Final    BOTTLES DRAWN AEROBIC AND ANAEROBIC Blood Culture adequate  volume   Culture  Setup Time   Final    GRAM POSITIVE COCCI IN CHAINS IN BOTH AEROBIC AND ANAEROBIC BOTTLES CRITICAL VALUE NOTED.  VALUE IS CONSISTENT WITH PREVIOUSLY REPORTED AND CALLED VALUE.    Culture (A)  Final    STREPTOCOCCUS GROUP G SUSCEPTIBILITIES PERFORMED ON PREVIOUS CULTURE WITHIN THE LAST 5 DAYS.    Report Status 03/26/2017 FINAL  Final  Blood culture (routine x 2)     Status: Abnormal   Collection Time: 03/23/17  8:35 PM  Result Value Ref Range Status   Specimen Description BLOOD LEFT ANTECUBITAL  Final   Special Requests   Final    BOTTLES DRAWN AEROBIC AND ANAEROBIC Blood Culture adequate volume   Culture  Setup Time   Final    GRAM POSITIVE COCCI IN CHAINS IN BOTH AEROBIC AND ANAEROBIC BOTTLES CRITICAL RESULT CALLED TO, READ BACK BY AND VERIFIED WITH: Truett Perna.D. 10:40 03/24/17 ([email or web address])    Culture STREPTOCOCCUS GROUP G (A)  Final   Report Status 03/26/2017 FINAL  Final   Organism ID, Bacteria STREPTOCOCCUS  GROUP G  Final      Susceptibility   Streptococcus group g - MIC*    CLINDAMYCIN <=0.25 SENSITIVE Sensitive     AMPICILLIN <=0.25 SENSITIVE Sensitive     ERYTHROMYCIN <=0.12 SENSITIVE Sensitive     VANCOMYCIN 0.25 SENSITIVE Sensitive     CEFTRIAXONE <=0.12 SENSITIVE Sensitive     LEVOFLOXACIN 1 SENSITIVE Sensitive     PENICILLIN Value in next row Sensitive      SENSITIVE<=0.06    * STREPTOCOCCUS GROUP G  Blood Culture ID Panel (Reflexed)     Status: Abnormal   Collection Time: 03/23/17  8:35 PM  Result Value Ref Range Status   Enterococcus species NOT DETECTED NOT DETECTED Final   Listeria monocytogenes NOT DETECTED NOT DETECTED Final   Staphylococcus species NOT DETECTED NOT DETECTED Final   Staphylococcus aureus NOT DETECTED NOT DETECTED Final   Streptococcus species DETECTED (A) NOT DETECTED Final    Comment: Not Enterococcus species, Streptococcus agalactiae, Streptococcus pyogenes, or Streptococcus pneumoniae. CRITICAL RESULT CALLED TO, READ BACK BY AND VERIFIED WITH: M. Radford Pax Pharm.D. 10:40 03/24/17 ([email or web address])    Streptococcus agalactiae NOT DETECTED NOT DETECTED Final   Streptococcus pneumoniae NOT DETECTED NOT DETECTED Final   Streptococcus pyogenes NOT DETECTED NOT DETECTED Final   Acinetobacter baumannii NOT DETECTED NOT DETECTED Final   Enterobacteriaceae species NOT DETECTED NOT DETECTED Final   Enterobacter cloacae complex NOT DETECTED NOT DETECTED Final   Escherichia coli NOT DETECTED NOT DETECTED Final   Klebsiella oxytoca NOT DETECTED NOT DETECTED Final   Klebsiella pneumoniae NOT DETECTED NOT DETECTED Final   Proteus species NOT DETECTED NOT DETECTED Final   Serratia marcescens NOT DETECTED NOT DETECTED Final   Haemophilus influenzae NOT DETECTED NOT DETECTED Final   Neisseria meningitidis NOT DETECTED NOT DETECTED Final   Pseudomonas aeruginosa NOT DETECTED NOT DETECTED Final   Candida albicans NOT DETECTED NOT DETECTED Final   Candida glabrata NOT DETECTED  NOT DETECTED Final   Candida krusei NOT DETECTED NOT DETECTED Final   Candida parapsilosis NOT DETECTED NOT DETECTED Final   Candida tropicalis NOT DETECTED NOT DETECTED Final  MRSA PCR Screening     Status: None   Collection Time: 03/24/17  1:49 AM  Result Value Ref Range Status   MRSA by PCR NEGATIVE NEGATIVE Final    Comment:        The GeneXpert MRSA Assay (FDA approved for NASAL  specimens only), is one component of a comprehensive MRSA colonization surveillance program. It is not intended to diagnose MRSA infection nor to guide or monitor treatment for MRSA infections.          Radiology Studies: No results found.      Scheduled Meds: . aspirin  81 mg Oral Daily  . diltiazem  300 mg Oral Daily  . docusate sodium  200 mg Oral BID  . metoprolol tartrate  12.5 mg Oral BID  . potassium chloride  10 mEq Oral Daily  . Warfarin - Pharmacist Dosing Inpatient   Does not apply q1800   Continuous Infusions: . cefTRIAXone (ROCEPHIN)  IV 2 g (03/26/17 0903)     LOS: 2 days    Time spent: 85min    Domenic Polite, MD [facility] Page via [email or web address], password [email or web address] After 7PM please contact night-coverage  03/26/2017, 11:53 AM

## 2017-03-26 NOTE — Discharge Instructions (Signed)

## 2017-03-26 NOTE — Progress Notes (Signed)
CRITICAL VALUE ALERT  Critical Value: INR 4.24  Date & Time Notied:  03/26/17 0450  Provider Notified: blount  Orders Received/Actions taken: no orders recieved

## 2017-03-26 NOTE — Progress Notes (Signed)
PHARMACY CONSULT NOTE FOR:  OUTPATIENT  PARENTERAL ANTIBIOTIC THERAPY (OPAT)  Indication: Group G strep bacteremia Regimen: Ceftriaxone 2 gm every 24 hours End date: 04/07/17  IV antibiotic discharge orders are pended. To discharging provider:  please sign these orders via discharge navigator,  Select New Orders & click on the button choice - Manage This Unsigned Work.     Thank you for allowing pharmacy to be a part of this patient's care.  Susa Raring, PharmD PGY2 Infectious Diseases Pharmacy Resident PAger: [phone]  03/26/2017, 7:04 PM

## 2017-03-26 NOTE — Progress Notes (Addendum)
Subjective: No new complaints   Antibiotics:  Anti-infectives (From admission, onward)   Start     Dose/Rate Route Frequency Ordered Stop   03/25/17 0800  cefTRIAXone (ROCEPHIN) 2 g in dextrose 5 % 50 mL IVPB     2 g 100 mL/hr over 30 Minutes Intravenous Every 24 hours 03/25/17 0744     03/24/17 2030  vancomycin (VANCOCIN) 1,500 mg in sodium chloride 0.9 % 500 mL IVPB  Status:  Discontinued     1,500 mg 250 mL/hr over 120 Minutes Intravenous Every 24 hours 03/23/17 2159 03/25/17 0720   03/24/17 0430  piperacillin-tazobactam (ZOSYN) IVPB 3.375 g  Status:  Discontinued     3.375 g 12.5 mL/hr over 240 Minutes Intravenous Every 8 hours 03/23/17 2159 03/25/17 0720   03/23/17 2000  piperacillin-tazobactam (ZOSYN) IVPB 3.375 g     3.375 g 100 mL/hr over 30 Minutes Intravenous  Once 03/23/17 1952 03/23/17 2053   03/23/17 2000  vancomycin (VANCOCIN) 2,000 mg in sodium chloride 0.9 % 500 mL IVPB     2,000 mg 250 mL/hr over 120 Minutes Intravenous  Once 03/23/17 1952 03/23/17 2259      Medications: Scheduled Meds: . aspirin  81 mg Oral Daily  . diltiazem  300 mg Oral Daily  . docusate sodium  200 mg Oral BID  . metoprolol tartrate  12.5 mg Oral BID  . potassium chloride  10 mEq Oral Daily  . Warfarin - Pharmacist Dosing Inpatient   Does not apply q1800   Continuous Infusions: . cefTRIAXone (ROCEPHIN)  IV 2 g (03/26/17 0903)   PRN Meds:.acetaminophen **OR** acetaminophen, ondansetron **OR** ondansetron (ZOFRAN) IV, polyethylene glycol    Objective: Weight change: 5 lb 1.1 oz (2.3 kg)  Intake/Output Summary (Last 24 hours) at 03/26/2017 1106 Last data filed at 03/26/2017 0833 Gross per 24 hour  Intake 75 ml  Output 200 ml  Net -125 ml   Blood pressure 124/73, pulse 81, temperature 97.7 F (36.5 C), resp. rate 18, height 5\' 6"  (1.676 m), weight 271 lb 9.7 oz (123.2 kg), SpO2 97 %. Temp:  [97.7 F (36.5 C)-98.1 F (36.7 C)] 97.7 F (36.5 C) (12/30 0558) Pulse  Rate:  [62-94] 81 (12/30 0909) Resp:  [18-20] 18 (12/30 0558) BP: (124-156)/(62-94) 124/73 (12/30 0909) SpO2:  [97 %-98 %] 97 % (12/30 0558) Weight:  [271 lb 9.7 oz (123.2 kg)] 271 lb 9.7 oz (123.2 kg) (12/30 0558)  Physical Exam: General: Alert and awake, oriented x3, not in any acute distress. HEENT: anicteric sclera, n, EOMI CVS regular rate, normal r Chest:  no wheezing, no resp distress Abdomen: soft nontender, nondistended,  Skin: wound dressed Neuro: nonfocal  CBC:  CBC Latest Ref Rng & Units 03/26/2017 03/25/2017 03/24/2017  WBC 4.0 - 10.5 K/uL 5.5 7.6 19.8(H)  Hemoglobin 12.0 - 15.0 g/dL 11.0(L) 11.7(L) 12.3  Hematocrit 36.0 - 46.0 % 34.6(L) 37.1 38.0  Platelets 150 - 400 K/uL 123(L) 118(L) 128(L)     BMET Recent Labs    03/25/17 0749 03/26/17 0348  NA 139 137  K 4.0 3.8  CL 104 104  CO2 28 25  GLUCOSE 101* 90  BUN 19 18  CREATININE 1.76* 1.55*  CALCIUM 8.3* 7.9*     Liver Panel  Recent Labs    03/23/17 1947  PROT 8.3*  ALBUMIN 3.0*  AST 23  ALT 15  ALKPHOS 91  BILITOT 1.2       Sedimentation Rate Recent Labs  03/23/17 1947  ESRSEDRATE 49*   C-Reactive Protein Recent Labs    03/23/17 1947  CRP 7.1*    Micro Results: Recent Results (from the past 720 hour(s))  Culture, blood (Routine x 2)     Status: None   Collection Time: 03/04/17 10:10 AM  Result Value Ref Range Status   Specimen Description BLOOD RIGHT ANTECUBITAL  Final   Special Requests   Final    BOTTLES DRAWN AEROBIC AND ANAEROBIC Blood Culture adequate volume   Culture NO GROWTH 5 DAYS  Final   Report Status 03/09/2017 FINAL  Final  Culture, blood (Routine x 2)     Status: None   Collection Time: 03/04/17 12:20 PM  Result Value Ref Range Status   Specimen Description BLOOD RIGHT HAND  Final   Special Requests   Final    BOTTLES DRAWN AEROBIC ONLY Blood Culture adequate volume   Culture NO GROWTH 5 DAYS  Final   Report Status 03/09/2017 FINAL  Final    Aerobic/Anaerobic Culture (surgical/deep wound)     Status: None   Collection Time: 03/05/17 12:44 PM  Result Value Ref Range Status   Specimen Description LEG RIGHT  Final   Special Requests NONE  Final   Gram Stain   Final    MODERATE WBC PRESENT, PREDOMINANTLY PMN RARE GRAM POSITIVE COCCI IN PAIRS    Culture   Final    MODERATE METHICILLIN RESISTANT STAPHYLOCOCCUS AUREUS NO ANAEROBES ISOLATED    Report Status 03/10/2017 FINAL  Final   Organism ID, Bacteria METHICILLIN RESISTANT STAPHYLOCOCCUS AUREUS  Final      Susceptibility   Methicillin resistant staphylococcus aureus - MIC*    CIPROFLOXACIN >=8 RESISTANT Resistant     ERYTHROMYCIN >=8 RESISTANT Resistant     GENTAMICIN <=0.5 SENSITIVE Sensitive     OXACILLIN >=4 RESISTANT Resistant     TETRACYCLINE <=1 SENSITIVE Sensitive     VANCOMYCIN 1 SENSITIVE Sensitive     TRIMETH/SULFA <=10 SENSITIVE Sensitive     CLINDAMYCIN >=8 RESISTANT Resistant     RIFAMPIN <=0.5 SENSITIVE Sensitive     Inducible Clindamycin NEGATIVE Sensitive     * MODERATE METHICILLIN RESISTANT STAPHYLOCOCCUS AUREUS  Blood culture (routine x 2)     Status: Abnormal   Collection Time: 03/23/17  8:00 PM  Result Value Ref Range Status   Specimen Description BLOOD RIGHT ANTECUBITAL  Final   Special Requests   Final    BOTTLES DRAWN AEROBIC AND ANAEROBIC Blood Culture adequate volume   Culture  Setup Time   Final    GRAM POSITIVE COCCI IN CHAINS IN BOTH AEROBIC AND ANAEROBIC BOTTLES CRITICAL VALUE NOTED.  VALUE IS CONSISTENT WITH PREVIOUSLY REPORTED AND CALLED VALUE.    Culture (A)  Final    STREPTOCOCCUS GROUP G SUSCEPTIBILITIES PERFORMED ON PREVIOUS CULTURE WITHIN THE LAST 5 DAYS.    Report Status 03/26/2017 FINAL  Final  Blood culture (routine x 2)     Status: Abnormal   Collection Time: 03/23/17  8:35 PM  Result Value Ref Range Status   Specimen Description BLOOD LEFT ANTECUBITAL  Final   Special Requests   Final    BOTTLES DRAWN AEROBIC AND  ANAEROBIC Blood Culture adequate volume   Culture  Setup Time   Final    GRAM POSITIVE COCCI IN CHAINS IN BOTH AEROBIC AND ANAEROBIC BOTTLES CRITICAL RESULT CALLED TO, READ BACK BY AND VERIFIED WITH: Truett Perna.D. 10:40 03/24/17 ([email or web address])    Culture STREPTOCOCCUS GROUP G (A)  Final  Report Status 03/26/2017 FINAL  Final   Organism ID, Bacteria STREPTOCOCCUS GROUP G  Final      Susceptibility   Streptococcus group g - MIC*    CLINDAMYCIN <=0.25 SENSITIVE Sensitive     AMPICILLIN <=0.25 SENSITIVE Sensitive     ERYTHROMYCIN <=0.12 SENSITIVE Sensitive     VANCOMYCIN 0.25 SENSITIVE Sensitive     CEFTRIAXONE <=0.12 SENSITIVE Sensitive     LEVOFLOXACIN 1 SENSITIVE Sensitive     PENICILLIN Value in next row Sensitive      SENSITIVE<=0.06    * STREPTOCOCCUS GROUP G  Blood Culture ID Panel (Reflexed)     Status: Abnormal   Collection Time: 03/23/17  8:35 PM  Result Value Ref Range Status   Enterococcus species NOT DETECTED NOT DETECTED Final   Listeria monocytogenes NOT DETECTED NOT DETECTED Final   Staphylococcus species NOT DETECTED NOT DETECTED Final   Staphylococcus aureus NOT DETECTED NOT DETECTED Final   Streptococcus species DETECTED (A) NOT DETECTED Final    Comment: Not Enterococcus species, Streptococcus agalactiae, Streptococcus pyogenes, or Streptococcus pneumoniae. CRITICAL RESULT CALLED TO, READ BACK BY AND VERIFIED WITH: M. Radford Pax Pharm.D. 10:40 03/24/17 ([email or web address])    Streptococcus agalactiae NOT DETECTED NOT DETECTED Final   Streptococcus pneumoniae NOT DETECTED NOT DETECTED Final   Streptococcus pyogenes NOT DETECTED NOT DETECTED Final   Acinetobacter baumannii NOT DETECTED NOT DETECTED Final   Enterobacteriaceae species NOT DETECTED NOT DETECTED Final   Enterobacter cloacae complex NOT DETECTED NOT DETECTED Final   Escherichia coli NOT DETECTED NOT DETECTED Final   Klebsiella oxytoca NOT DETECTED NOT DETECTED Final   Klebsiella pneumoniae NOT DETECTED NOT DETECTED  Final   Proteus species NOT DETECTED NOT DETECTED Final   Serratia marcescens NOT DETECTED NOT DETECTED Final   Haemophilus influenzae NOT DETECTED NOT DETECTED Final   Neisseria meningitidis NOT DETECTED NOT DETECTED Final   Pseudomonas aeruginosa NOT DETECTED NOT DETECTED Final   Candida albicans NOT DETECTED NOT DETECTED Final   Candida glabrata NOT DETECTED NOT DETECTED Final   Candida krusei NOT DETECTED NOT DETECTED Final   Candida parapsilosis NOT DETECTED NOT DETECTED Final   Candida tropicalis NOT DETECTED NOT DETECTED Final  MRSA PCR Screening     Status: None   Collection Time: 03/24/17  1:49 AM  Result Value Ref Range Status   MRSA by PCR NEGATIVE NEGATIVE Final    Comment:        The GeneXpert MRSA Assay (FDA approved for NASAL specimens only), is one component of a comprehensive MRSA colonization surveillance program. It is not intended to diagnose MRSA infection nor to guide or monitor treatment for MRSA infections.     Studies/Results: No results found.    Assessment/Plan:  INTERVAL HISTORY: TTE pending   Active Problems:   ATRIAL FIBRILLATION   Long term (current) use of anticoagulants   Popliteal artery embolus (HCC)   Cellulitis   Wound infection   Cellulitis of right lower extremity    [name] is a 71 y.o. female with  PAD, ischemic right leg s/p embolectomy c/b MRSA wound s/p I x D in early December who now found to have streptococcal G bacteremia I would imagine via portal of her wound  #1 Group G streptococcal bacteremia:  Repeat blood cultures incubating TTE pending  I am ordering MRI of her TIB/FIBULA (not thigh as wound is below knee)  to ensure no deeper infection not noted by VVS in or that would require further intervention or more protracted  therapy  I would treat her for minimum of 2 weeks from date of first negative blood culture        LOS: 2 days   [place] 03/26/2017, 11:06 AM

## 2017-03-26 NOTE — Progress Notes (Signed)
  Echocardiogram 2D Echocardiogram has been performed.  [name] L Androw 03/26/2017, 3:58 PM

## 2017-03-27 LAB — BASIC METABOLIC PANEL
ANION GAP: 10 (ref 5–15)
BUN: 15 mg/dL (ref 6–20)
CALCIUM: 8.1 mg/dL — AB (ref 8.9–10.3)
CHLORIDE: 104 mmol/L (ref 101–111)
CO2: 24 mmol/L (ref 22–32)
CREATININE: 1.22 mg/dL — AB (ref 0.44–1.00)
GFR calc non Af Amer: 43 mL/min — ABNORMAL LOW (ref >60)
GFR, EST AFRICAN AMERICAN: 50 mL/min — AB (ref >60)
Glucose, Bld: 86 mg/dL (ref 65–99)
Potassium: 3.7 mmol/L (ref 3.5–5.1)
SODIUM: 138 mmol/L (ref 135–145)

## 2017-03-27 LAB — PROTIME-INR
INR: 4
Prothrombin Time: 38.7 seconds — ABNORMAL HIGH (ref 11.4–15.2)

## 2017-03-27 MED ORDER — POTASSIUM CHLORIDE CRYS ER 20 MEQ PO TBCR
40.0000 meq | EXTENDED_RELEASE_TABLET | Freq: Every day | ORAL | Status: DC
  Administered 2017-03-27 – 2017-03-28 (×2): 40 meq via ORAL
  Filled 2017-03-27 (×2): qty 2

## 2017-03-27 MED ORDER — FUROSEMIDE 40 MG PO TABS
40.0000 mg | ORAL_TABLET | Freq: Every day | ORAL | Status: DC
  Administered 2017-03-27 – 2017-03-28 (×2): 40 mg via ORAL
  Filled 2017-03-27 (×2): qty 1

## 2017-03-27 NOTE — Progress Notes (Signed)
PROGRESS NOTE    KAYLY KRIEGEL  OVF:[id number] DOB: [birth year] DOA: 03/23/2017 PCP: Patient, No Pcp Per  Brief Narrative:Ms. Cage is a 71 year old female with history of atrial fibrillation, positive lupus anticoagulant, embolic stroke, DVT status post IVC filter, she was found to have an acutely ischemic right foot on 11/21 subsequently taken to the OR and underwent embolectomy via medial below-knee incision on 11/22, subsequently went back to the OR 12/18 for MRSA abscess with large amount of purulence evacuated and wound VAC placement in the below knee popliteal space. -Readmitted yesterday morning with generalized weakness and fever of 105  Assessment & Plan:   Fever/group G streptococcal bacteremia -Source is not completely clear, mild cellulitis around the recent surgical wound most likely possibility -Continue IV ceftriaxone-will likely need 2 weeks if this -Repeat blood negative 48 hours -2-D echocardiogram-with EF of 45%, no vegetations noted -Appreciate infectious disease input , MRI notes some myositis, but no abscess -will order PICC pending ID input today -Appreciate vascular surgery input  Paroxysmal atrial fibrillation -Heart rate controlled, continue Cardizem and metoprolol -Coumadin per pharmacy, INR supratherapeutic, hold today's dose  History of DVT and pulmonary embolism, as well as embolic stroke and history of lupus anticoagulant -Continue Coumadin per pharmacy  History of MRSA right leg abscess -Status post incision and debridement -s/p 1 week of Vanc inpatient and then Doxy for 7days at [facility] -VVS following, wound appears clean now, VAC to be reapplied  Acute ischemic limb s/p embolectomy -on 84/16  Chronic diastolic CHF -compensated, resume lasix today -based on ECHO yesterday likley has systolic CHF too, EF 60%, asymptomatic at this time, resume PO lasix -FU with Cards, do not think inpatient workup warranted at this time in setting of Becteremia  etc and absence of symptoms  DVT prophylaxis: on coumadin Code Status: Full Code Family Communication: None at bedside Disposition Plan: home pending stability, and clearance of bacteremia   Consultants:   Vascular surgery  Infectious disease   Procedures:   Antimicrobials:  Antibiotics Given (last 72 hours)    Date/Time Action Medication Dose Rate   03/24/17 1436 New Bag/Given   piperacillin-tazobactam (ZOSYN) IVPB 3.375 g 3.375 g 12.5 mL/hr   03/24/17 2037 New Bag/Given   vancomycin (VANCOCIN) 1,500 mg in sodium chloride 0.9 % 500 mL IVPB 1,500 mg 250 mL/hr   03/24/17 2037 New Bag/Given   piperacillin-tazobactam (ZOSYN) IVPB 3.375 g 3.375 g 12.5 mL/hr   03/25/17 0428 New Bag/Given   piperacillin-tazobactam (ZOSYN) IVPB 3.375 g 3.375 g 12.5 mL/hr   03/25/17 1025 New Bag/Given   cefTRIAXone (ROCEPHIN) 2 g in dextrose 5 % 50 mL IVPB 2 g 100 mL/hr   03/26/17 0903 New Bag/Given   cefTRIAXone (ROCEPHIN) 2 g in dextrose 5 % 50 mL IVPB 2 g 100 mL/hr   03/27/17 0841 New Bag/Given   cefTRIAXone (ROCEPHIN) 2 g in dextrose 5 % 50 mL IVPB 2 g 100 mL/hr      Subjective: -Feels well, no complaints, anxious to go home  Objective: Vitals:   03/26/17 1605 03/26/17 2150 03/27/17 0315 03/27/17 0510  BP: (!) 147/112 134/70  (!) 163/78  Pulse: 62 60  62  Resp: 16 20  20   Temp: 97.6 F (36.4 C) 98.2 F (36.8 C)  98.3 F (36.8 C)  TempSrc: Oral Oral  Oral  SpO2: 98% 96%  95%  Weight:   121.7 kg (268 lb 4.8 oz)   Height:        Intake/Output Summary (Last 24  hours) at 03/27/2017 1241 Last data filed at 03/27/2017 1000 Gross per 24 hour  Intake 270 ml  Output 1 ml  Net 269 ml   Filed Weights   03/25/17 0332 03/26/17 0558 03/27/17 0315  Weight: 120.9 kg (266 lb 8.6 oz) 123.2 kg (271 lb 9.7 oz) 121.7 kg (268 lb 4.8 oz)    Examination:  Gen: Awake, Alert, Oriented X 3,  HEENT: PERRLA, Neck supple, no JVD Lungs: Good air movement bilaterally, CTAB CVS: RRR,No Gallops,Rubs  or new Murmurs Abd: soft, Non tender, non distended, BS present Extremities: Right leg popliteal foci with deep surgical wound with pink granulation tissue and improved surrounding erythema Skin: Surgical wound as noted above Psychiatry: Judgement and insight appear normal. Mood & affect appropriate.     Data Reviewed:   CBC: Recent Labs  Lab 03/23/17 1947 03/24/17 0230 03/25/17 0749 03/26/17 0348  WBC 11.4* 19.8* 7.6 5.5  NEUTROABS 10.0*  --   --   --   HGB 14.2 12.3 11.7* 11.0*  HCT 42.7 38.0 37.1 34.6*  MCV 98.6 100.3* 98.7 100.6*  PLT 139* 128* 118* 341*   Basic Metabolic Panel: Recent Labs  Lab 03/23/17 1947 03/24/17 0230 03/25/17 0749 03/26/17 0348 03/27/17 0226  NA 134* 136 139 137 138  K 4.1 3.7 4.0 3.8 3.7  CL 98* 100* 104 104 104  CO2 27 25 28 25 24   GLUCOSE 133* 137* 101* 90 86  BUN 14 14 19 18 15   CREATININE 1.18* 1.30* 1.76* 1.55* 1.22*  CALCIUM 8.6* 7.9* 8.3* 7.9* 8.1*   GFR: Estimated Creatinine Clearance: 56.3 mL/min (A) (by C-G formula based on SCr of 1.22 mg/dL (H)). Liver Function Tests: Recent Labs  Lab 03/23/17 1947  AST 23  ALT 15  ALKPHOS 91  BILITOT 1.2  PROT 8.3*  ALBUMIN 3.0*   No results for input(s): LIPASE, AMYLASE in the last 168 hours. No results for input(s): AMMONIA in the last 168 hours. Coagulation Profile: Recent Labs  Lab 03/23/17 2036 03/24/17 0526 03/25/17 0749 03/26/17 0348 03/27/17 0226  INR 3.82 3.67 4.42* 4.24* 4.00   Cardiac Enzymes: No results for input(s): CKTOTAL, CKMB, CKMBINDEX, TROPONINI in the last 168 hours. BNP (last 3 results) No results for input(s): PROBNP in the last 8760 hours. HbA1C: No results for input(s): HGBA1C in the last 72 hours. CBG: No results for input(s): GLUCAP in the last 168 hours. Lipid Profile: No results for input(s): CHOL, HDL, LDLCALC, TRIG, CHOLHDL, LDLDIRECT in the last 72 hours. Thyroid Function Tests: No results for input(s): TSH, T4TOTAL, FREET4, T3FREE,  THYROIDAB in the last 72 hours. Anemia Panel: No results for input(s): VITAMINB12, FOLATE, FERRITIN, TIBC, IRON, RETICCTPCT in the last 72 hours. Urine analysis:    Component Value Date/Time   COLORURINE YELLOW 03/24/2017 1856   APPEARANCEUR HAZY (A) 03/24/2017 1856   LABSPEC 1.012 03/24/2017 1856   PHURINE 5.0 03/24/2017 1856   GLUCOSEU NEGATIVE 03/24/2017 1856   HGBUR MODERATE (A) 03/24/2017 1856   BILIRUBINUR NEGATIVE 03/24/2017 1856   KETONESUR NEGATIVE 03/24/2017 1856   PROTEINUR NEGATIVE 03/24/2017 1856   UROBILINOGEN 1.0 07/07/2010 1500   NITRITE NEGATIVE 03/24/2017 1856   LEUKOCYTESUR LARGE (A) 03/24/2017 1856   Sepsis Labs: @LABRCNTIP (procalcitonin:4,lacticidven:4)  ) Recent Results (from the past 240 hour(s))  Blood culture (routine x 2)     Status: Abnormal   Collection Time: 03/23/17  8:00 PM  Result Value Ref Range Status   Specimen Description BLOOD RIGHT ANTECUBITAL  Final   Special  Requests   Final    BOTTLES DRAWN AEROBIC AND ANAEROBIC Blood Culture adequate volume   Culture  Setup Time   Final    GRAM POSITIVE COCCI IN CHAINS IN BOTH AEROBIC AND ANAEROBIC BOTTLES CRITICAL VALUE NOTED.  VALUE IS CONSISTENT WITH PREVIOUSLY REPORTED AND CALLED VALUE.    Culture (A)  Final    STREPTOCOCCUS GROUP G SUSCEPTIBILITIES PERFORMED ON PREVIOUS CULTURE WITHIN THE LAST 5 DAYS.    Report Status 03/26/2017 FINAL  Final  Blood culture (routine x 2)     Status: Abnormal   Collection Time: 03/23/17  8:35 PM  Result Value Ref Range Status   Specimen Description BLOOD LEFT ANTECUBITAL  Final   Special Requests   Final    BOTTLES DRAWN AEROBIC AND ANAEROBIC Blood Culture adequate volume   Culture  Setup Time   Final    GRAM POSITIVE COCCI IN CHAINS IN BOTH AEROBIC AND ANAEROBIC BOTTLES CRITICAL RESULT CALLED TO, READ BACK BY AND VERIFIED WITH: Truett Perna.D. 10:40 03/24/17 ([email or web address])    Culture STREPTOCOCCUS GROUP G (A)  Final   Report Status 03/26/2017 FINAL  Final    Organism ID, Bacteria STREPTOCOCCUS GROUP G  Final      Susceptibility   Streptococcus group g - MIC*    CLINDAMYCIN <=0.25 SENSITIVE Sensitive     AMPICILLIN <=0.25 SENSITIVE Sensitive     ERYTHROMYCIN <=0.12 SENSITIVE Sensitive     VANCOMYCIN 0.25 SENSITIVE Sensitive     CEFTRIAXONE <=0.12 SENSITIVE Sensitive     LEVOFLOXACIN 1 SENSITIVE Sensitive     PENICILLIN Value in next row Sensitive      SENSITIVE<=0.06    * STREPTOCOCCUS GROUP G  Blood Culture ID Panel (Reflexed)     Status: Abnormal   Collection Time: 03/23/17  8:35 PM  Result Value Ref Range Status   Enterococcus species NOT DETECTED NOT DETECTED Final   Listeria monocytogenes NOT DETECTED NOT DETECTED Final   Staphylococcus species NOT DETECTED NOT DETECTED Final   Staphylococcus aureus NOT DETECTED NOT DETECTED Final   Streptococcus species DETECTED (A) NOT DETECTED Final    Comment: Not Enterococcus species, Streptococcus agalactiae, Streptococcus pyogenes, or Streptococcus pneumoniae. CRITICAL RESULT CALLED TO, READ BACK BY AND VERIFIED WITH: M. Radford Pax Pharm.D. 10:40 03/24/17 ([email or web address])    Streptococcus agalactiae NOT DETECTED NOT DETECTED Final   Streptococcus pneumoniae NOT DETECTED NOT DETECTED Final   Streptococcus pyogenes NOT DETECTED NOT DETECTED Final   Acinetobacter baumannii NOT DETECTED NOT DETECTED Final   Enterobacteriaceae species NOT DETECTED NOT DETECTED Final   Enterobacter cloacae complex NOT DETECTED NOT DETECTED Final   Escherichia coli NOT DETECTED NOT DETECTED Final   Klebsiella oxytoca NOT DETECTED NOT DETECTED Final   Klebsiella pneumoniae NOT DETECTED NOT DETECTED Final   Proteus species NOT DETECTED NOT DETECTED Final   Serratia marcescens NOT DETECTED NOT DETECTED Final   Haemophilus influenzae NOT DETECTED NOT DETECTED Final   Neisseria meningitidis NOT DETECTED NOT DETECTED Final   Pseudomonas aeruginosa NOT DETECTED NOT DETECTED Final   Candida albicans NOT DETECTED NOT DETECTED  Final   Candida glabrata NOT DETECTED NOT DETECTED Final   Candida krusei NOT DETECTED NOT DETECTED Final   Candida parapsilosis NOT DETECTED NOT DETECTED Final   Candida tropicalis NOT DETECTED NOT DETECTED Final  MRSA PCR Screening     Status: None   Collection Time: 03/24/17  1:49 AM  Result Value Ref Range Status   MRSA by PCR NEGATIVE NEGATIVE Final    Comment:  The GeneXpert MRSA Assay (FDA approved for NASAL specimens only), is one component of a comprehensive MRSA colonization surveillance program. It is not intended to diagnose MRSA infection nor to guide or monitor treatment for MRSA infections.   Culture, blood (routine x 2)     Status: None (Preliminary result)   Collection Time: 03/25/17  7:45 AM  Result Value Ref Range Status   Specimen Description BLOOD LEFT ANTECUBITAL  Final   Special Requests IN PEDIATRIC BOTTLE Blood Culture adequate volume  Final   Culture NO GROWTH 2 DAYS  Final   Report Status PENDING  Incomplete  Culture, blood (routine x 2)     Status: None (Preliminary result)   Collection Time: 03/25/17  7:50 AM  Result Value Ref Range Status   Specimen Description BLOOD LEFT HAND  Final   Special Requests IN PEDIATRIC BOTTLE Blood Culture adequate volume  Final   Culture NO GROWTH 2 DAYS  Final   Report Status PENDING  Incomplete         Radiology Studies: Mr Tibia Fibula Right Wo Contrast  Result Date: 03/26/2017 CLINICAL DATA:  Fever.  History of a right leg wound infection. EXAM: MRI OF LOWER RIGHT EXTREMITY WITHOUT CONTRAST TECHNIQUE: Multiplanar, multisequence MR imaging of the right lower extremity was performed. No intravenous contrast was administered. COMPARISON:  Radiographs 03/23/2017 FINDINGS: There is a large and deep open wound involving the medial aspect of the right lower extremity just below the knee joint. This extends into the adjacent musculature. No discrete drainable soft tissue abscess is identified. There is mild  diffuse cellulitis and myositis but no evidence of pyomyositis. No MR findings to suggest septic arthritis or osteomyelitis. IMPRESSION: 1. Large open wound involving the medial aspect of the right lower extremity with probable granulation tissue. No discrete drainable abscess or significant findings of cellulitis. There is focal myositis involving the adjacent upper calf musculature but no evidence of pyomyositis. 2. No findings to suggest septic arthritis or osteomyelitis. Electronically Signed   By: Marijo Sanes M.D.   On: 03/26/2017 17:33        Scheduled Meds: . aspirin  81 mg Oral Daily  . diltiazem  300 mg Oral Daily  . docusate sodium  200 mg Oral BID  . metoprolol tartrate  12.5 mg Oral BID  . potassium chloride  10 mEq Oral Daily  . Warfarin - Pharmacist Dosing Inpatient   Does not apply q1800   Continuous Infusions: . cefTRIAXone (ROCEPHIN)  IV Stopped (03/27/17 0911)     LOS: 3 days    Time spent: 51min    Domenic Polite, MD [facility] Page via [email or web address], password [email or web address] After 7PM please contact night-coverage  03/27/2017, 12:41 PM

## 2017-03-27 NOTE — Progress Notes (Signed)
[facility] for Warfarin Indication: Afib + h/o PE + h/o popliteal artery embolus  Allergies  Allergen Reactions  . Heparin Hives    Patient attests severe allergy- rash all over, severe itching, unable to take heparin  . Other Itching and Rash    "Sheets and Linens used by [facility]"    Labs: Recent Labs    03/25/17 0749 03/26/17 0348 03/27/17 0226  HGB 11.7* 11.0*  --   HCT 37.1 34.6*  --   PLT 118* 123*  --   LABPROT 41.8* 40.5* 38.7*  INR 4.42* 4.24* 4.00  CREATININE 1.76* 1.55* 1.22*    Estimated Creatinine Clearance: 56.3 mL/min (A) (by C-G formula based on SCr of 1.22 mg/dL (H)).   Assessment: 71yo female admitted w/ cellulitis associated w/ fever/fatigue/N/V, to continue Warfarin for Afib + h/o PE + h/o popliteal artery embolus; current INR above goal at 4 but trending down. Hemoglobin and platelets are stable. No documented concern of bleeding at this time.   Last anticoag visit to hold warfarin 12/27 and 12/28 and to resume 12/29 at 6 mg daily exc 4.5 mg MWF  Goal of Therapy:  INR 2.5-3.5   Plan:  Hold warfarin tonight Daily INR, CBC  Monitor for s/sx of bleeding   Renold Genta, PharmD, BCPS Clinical Pharmacist Phone for today - [place] - [phone] 03/27/2017 9:01 AM

## 2017-03-27 NOTE — Consult Note (Addendum)
[facility] Nurse wound consult note Pt is familiar to [facility] team from recent admission; refer to previous progress notes on 12/14.  Wound has improved and decreased in depth since that time; pt is followed by the vascular service.  She has been using a Vac at home prior to admission and it was removed to assess wound, requested to re-apply Vac. Wound type:full thickness post-op wound to right leg Wound bed: beefy red granulation tissue; 9.5X3.5X3.5cm  Drainage (amount, consistency, odor)mod amt yellow drainage, no odor Periwound:intact Dressing procedure/placement/frequency:Applied one piece black foam to 138mm cont suction. Pt tolerated with minimal amt discomfort. Plan for bedside nurses to change Q M/W/F. Pt states her home Vac machine can be brought in by a family member and re-connected when she is ready for discharge. Please re-consult if further assistance is needed.  Thank-you,  Julien Girt MSN, [place], [place], [place], [place]

## 2017-03-27 NOTE — Progress Notes (Signed)
[facility]  Patient Status: Active (receiving services up to time of hospitalization)  [facility] is providing the following services: RN, PT, OT and MSW  If patient discharges after hours, please call [phone].   [name] 03/27/2017, 10:45 AM

## 2017-03-27 NOTE — Progress Notes (Signed)
Subjective: Interval History: none..   Objective: Vital signs in last 24 hours: Temp:  [97.6 F (36.4 C)-98.3 F (36.8 C)] 98.3 F (36.8 C) (12/31 0510) Pulse Rate:  [60-62] 62 (12/31 0510) Resp:  [16-20] 20 (12/31 0510) BP: (134-163)/(70-112) 163/78 (12/31 0510) SpO2:  [95 %-98 %] 95 % (12/31 0510) Weight:  [268 lb 4.8 oz (121.7 kg)] 268 lb 4.8 oz (121.7 kg) (12/31 0315)  Intake/Output from previous day: 12/30 0701 - 12/31 0700 In: 75 [P.O.:25; IV Piggyback:50] Out: 3 [Urine:2; Stool:1] Intake/Output this shift: No intake/output data recorded.  Popliteal incision looks great.  Granulating tissue with no purulence.  Will begin [facility]  Lab Results: Recent Labs    03/25/17 0749 03/26/17 0348  WBC 7.6 5.5  HGB 11.7* 11.0*  HCT 37.1 34.6*  PLT 118* 123*   BMET Recent Labs    03/26/17 0348 03/27/17 0226  NA 137 138  K 3.8 3.7  CL 104 104  CO2 25 24  GLUCOSE 90 86  BUN 18 15  CREATININE 1.55* 1.22*  CALCIUM 7.9* 8.1*    Studies/Results: Dg Chest 2 View  Result Date: 03/23/2017 CLINICAL DATA:  Acute onset of fever.  Right lower leg infection. EXAM: CHEST  2 VIEW COMPARISON:  Chest radiograph performed 03/04/2017 FINDINGS: The lungs are well-aerated. Vascular congestion is noted. There is no evidence of focal opacification, pleural effusion or pneumothorax. The heart is mildly enlarged. No acute osseous abnormalities are seen. IMPRESSION: Vascular congestion and mild cardiomegaly. Lungs remain grossly clear. Electronically Signed   By: Garald Balding M.D.   On: 03/23/2017 22:12   Dg Tibia/fibula Right  Result Date: 03/23/2017 CLINICAL DATA:  Acute onset of fever and subacute onset of right lower leg infection. EXAM: RIGHT TIBIA AND FIBULA - 2 VIEW COMPARISON:  Right foot radiographs performed 10/21/2009 FINDINGS: There is no evidence of osseous erosion. No fracture or dislocation is seen. The knee joint is unremarkable in appearance. No knee joint effusion is  identified. The ankle mortise is incompletely assessed, but appears grossly unremarkable. Soft tissue swelling is noted about the proximal lower leg. Postoperative change is seen at the proximal lower leg. IMPRESSION: No evidence of osseous erosion. Soft tissue swelling noted about the proximal lower leg. Electronically Signed   By: Garald Balding M.D.   On: 03/23/2017 22:13   Mr Tibia Fibula Right Wo Contrast  Result Date: 03/26/2017 CLINICAL DATA:  Fever.  History of a right leg wound infection. EXAM: MRI OF LOWER RIGHT EXTREMITY WITHOUT CONTRAST TECHNIQUE: Multiplanar, multisequence MR imaging of the right lower extremity was performed. No intravenous contrast was administered. COMPARISON:  Radiographs 03/23/2017 FINDINGS: There is a large and deep open wound involving the medial aspect of the right lower extremity just below the knee joint. This extends into the adjacent musculature. No discrete drainable soft tissue abscess is identified. There is mild diffuse cellulitis and myositis but no evidence of pyomyositis. No MR findings to suggest septic arthritis or osteomyelitis. IMPRESSION: 1. Large open wound involving the medial aspect of the right lower extremity with probable granulation tissue. No discrete drainable abscess or significant findings of cellulitis. There is focal myositis involving the adjacent upper calf musculature but no evidence of pyomyositis. 2. No findings to suggest septic arthritis or osteomyelitis. Electronically Signed   By: Marijo Sanes M.D.   On: 03/26/2017 17:33   Dg Chest Port 1 View  Result Date: 03/04/2017 CLINICAL DATA:  Blood clot RIGHT leg removed after Thanksgiving, now with swelling and  redness at site, sepsis, history of heart failure, pulmonary embolism, DVT, hypertension EXAM: PORTABLE CHEST 1 VIEW COMPARISON:  Portable exam 1241 hours compared to 06/06/2010 FINDINGS: Enlargement of cardiac silhouette. Mediastinal contours and pulmonary vascularity normal. Lungs  clear. No pleural effusion or pneumothorax. Bones demineralized. IMPRESSION: Enlargement of cardiac silhouette. No acute abnormalities. Electronically Signed   By: Lavonia Dana M.D.   On: 03/04/2017 13:15   Anti-infectives: Anti-infectives (From admission, onward)   Start     Dose/Rate Route Frequency Ordered Stop   03/25/17 0800  cefTRIAXone (ROCEPHIN) 2 g in dextrose 5 % 50 mL IVPB     2 g 100 mL/hr over 30 Minutes Intravenous Every 24 hours 03/25/17 0744     03/24/17 2030  vancomycin (VANCOCIN) 1,500 mg in sodium chloride 0.9 % 500 mL IVPB  Status:  Discontinued     1,500 mg 250 mL/hr over 120 Minutes Intravenous Every 24 hours 03/23/17 2159 03/25/17 0720   03/24/17 0430  piperacillin-tazobactam (ZOSYN) IVPB 3.375 g  Status:  Discontinued     3.375 g 12.5 mL/hr over 240 Minutes Intravenous Every 8 hours 03/23/17 2159 03/25/17 0720   03/23/17 2000  piperacillin-tazobactam (ZOSYN) IVPB 3.375 g     3.375 g 100 mL/hr over 30 Minutes Intravenous  Once 03/23/17 1952 03/23/17 2053   03/23/17 2000  vancomycin (VANCOCIN) 2,000 mg in sodium chloride 0.9 % 500 mL IVPB     2,000 mg 250 mL/hr over 120 Minutes Intravenous  Once 03/23/17 1952 03/23/17 2259      Assessment/Plan: s/p * No surgery found * Remains afebrile.  Replace VAC dressing from wet-to-dry   LOS: 3 days   Stewart Pimenta 03/27/2017, 10:42 AM

## 2017-03-28 ENCOUNTER — Inpatient Hospital Stay (HOSPITAL_COMMUNITY): Payer: Medicare Other

## 2017-03-28 LAB — CBC
HEMATOCRIT: 36.4 % (ref 36.0–46.0)
HEMOGLOBIN: 11.6 g/dL — AB (ref 12.0–15.0)
MCH: 32 pg (ref 26.0–34.0)
MCHC: 31.9 g/dL (ref 30.0–36.0)
MCV: 100.6 fL — AB (ref 78.0–100.0)
Platelets: 159 10*3/uL (ref 150–400)
RBC: 3.62 MIL/uL — AB (ref 3.87–5.11)
RDW: 14 % (ref 11.5–15.5)
WBC: 7.6 10*3/uL (ref 4.0–10.5)

## 2017-03-28 LAB — PROTIME-INR
INR: 3.79
Prothrombin Time: 37.1 seconds — ABNORMAL HIGH (ref 11.4–15.2)

## 2017-03-28 LAB — BASIC METABOLIC PANEL
Anion gap: 8 (ref 5–15)
BUN: 9 mg/dL (ref 6–20)
CHLORIDE: 104 mmol/L (ref 101–111)
CO2: 26 mmol/L (ref 22–32)
Calcium: 8.3 mg/dL — ABNORMAL LOW (ref 8.9–10.3)
Creatinine, Ser: 1.21 mg/dL — ABNORMAL HIGH (ref 0.44–1.00)
GFR calc Af Amer: 51 mL/min — ABNORMAL LOW (ref >60)
GFR calc non Af Amer: 44 mL/min — ABNORMAL LOW (ref >60)
GLUCOSE: 88 mg/dL (ref 65–99)
POTASSIUM: 3.7 mmol/L (ref 3.5–5.1)
Sodium: 138 mmol/L (ref 135–145)

## 2017-03-28 MED ORDER — WARFARIN SODIUM 2.5 MG PO TABS
4.5000 mg | ORAL_TABLET | Freq: Once | ORAL | Status: AC
  Administered 2017-03-28: 18:00:00 4.5 mg via ORAL
  Filled 2017-03-28: qty 1

## 2017-03-28 MED ORDER — SODIUM CHLORIDE 0.9% FLUSH: 10.0000 mL | Freq: Two times a day (BID) | INTRAVENOUS | Status: DC

## 2017-03-28 MED ORDER — CEFTRIAXONE IV (FOR PTA / DISCHARGE USE ONLY): 2.0000 g | INTRAVENOUS | 0 refills | Status: AC

## 2017-03-28 MED ORDER — SODIUM CHLORIDE 0.9% FLUSH: 10.0000 mL | INTRAVENOUS | Status: DC | PRN

## 2017-03-28 NOTE — Progress Notes (Signed)
[name] for Warfarin Indication: Afib + h/o PE + h/o popliteal artery embolus  Allergies  Allergen Reactions  . Heparin Hives    Patient attests severe allergy- rash all over, severe itching, unable to take heparin  . Other Itching and Rash    "Sheets and Linens used by [name]"    Labs: Recent Labs    03/26/17 0348 03/27/17 0226 03/28/17 0408  HGB 11.0*  --  11.6*  HCT 34.6*  --  36.4  PLT 123*  --  159  LABPROT 40.5* 38.7* 37.1*  INR 4.24* 4.00 3.79  CREATININE 1.55* 1.22* 1.21*    Estimated Creatinine Clearance: 56.3 mL/min (A) (by C-G formula based on SCr of 1.21 mg/dL (H)).   Assessment: 72yo female admitted w/ cellulitis associated w/ fever/fatigue/N/V, to continue Warfarin for Afib + h/o PE + h/o popliteal artery embolus; current INR above goal at 3.79 but trending down. Hemoglobin and platelets are stable. No documented concern of bleeding at this time.   Last anticoag visit to hold warfarin 12/27 and 12/28 and to resume 12/29 at 6 mg daily exc 4.5 mg MWF  Goal of Therapy:  INR 2.5-3.5   Plan:  Warfarin 4.5 mg PO x 1 today (even though off schedule) F/u s/sx bleeding, CBCs  Nida Boatman, PharmD PGY1 Acute Care Pharmacy Resident Pager: [phone] 03/28/2017 10:13 AM

## 2017-03-28 NOTE — Progress Notes (Addendum)
  Progress Note    03/28/2017 9:32 AM * No surgery found *  Subjective: No new complaints   Vitals:   03/27/17 2212 03/28/17 0503  BP: 131/63 140/84  Pulse: 84 61  Resp: 20 16  Temp: 98.4 F (36.9 C) 98.3 F (36.8 C)  SpO2: 97% 94%   Physical Exam: Lungs: Nonlabored Incisions: Right medial lower leg incision with wound VAC in place; 100 cc serous fluid collection in wound VAC canister Extremities: Palpable right DP Abdomen: Soft Neurologic: Alert and oriented  CBC    Component Value Date/Time   WBC 7.6 03/28/2017 0408   RBC 3.62 (L) 03/28/2017 0408   HGB 11.6 (L) 03/28/2017 0408   HCT 36.4 03/28/2017 0408   PLT 159 03/28/2017 0408   MCV 100.6 (H) 03/28/2017 0408   MCH 32.0 03/28/2017 0408   MCHC 31.9 03/28/2017 0408   RDW 14.0 03/28/2017 0408   LYMPHSABS 1.0 03/23/2017 1947   MONOABS 0.4 03/23/2017 1947   EOSABS 0.0 03/23/2017 1947   BASOSABS 0.0 03/23/2017 1947    BMET    Component Value Date/Time   NA 138 03/28/2017 0408   K 3.7 03/28/2017 0408   CL 104 03/28/2017 0408   CO2 26 03/28/2017 0408   GLUCOSE 88 03/28/2017 0408   BUN 9 03/28/2017 0408   CREATININE 1.21 (H) 03/28/2017 0408   CALCIUM 8.3 (L) 03/28/2017 0408   GFRNONAA 44 (L) 03/28/2017 0408   GFRAA 51 (L) 03/28/2017 0408    INR    Component Value Date/Time   INR 3.79 03/28/2017 0408   INR 2.2 05/07/2010 1039     Intake/Output Summary (Last 24 hours) at 03/28/2017 0932 Last data filed at 03/27/2017 1650 Gross per 24 hour  Intake 440 ml  Output -  Net 440 ml     Assessment/Plan:  72 y.o. female is s/p evacuation of a large amount of purulence on 03/05/17   Wound VAC restarted; change on Monday Wednesday Friday schedule Continue IV antibiotics per infectious disease Continue to monitor peripheral pulses   [name], PA-C [facility] [phone] 03/28/2017 9:32 AM

## 2017-03-28 NOTE — Discharge Summary (Signed)
Physician Discharge Summary  [name] MCN:[id number] DOB: Aug 07, 1945 DOA: 03/23/2017  PCP: Patient, No Pcp Per  Admit date: 03/23/2017 Discharge date: 03/28/2017  Time spent: 45 minutes  Recommendations for Outpatient Follow-up:  -Continue IV Ceftriaxone till January 11th, 2019 -[facility] Per Protocol:Labs weekly while on IV antibiotics:CBC with Diff and BMP -Please pull PIC at completion of IV antibiotics -Home health RN to help manage IV Abx and Wound Care/VAC -PCP in 1 week, needs Cardiology FU for Cardiomyopathy noted in setting of Sepsis -Vascular Dr.Fields in 2 weeks    Discharge Diagnoses:    Group G strep bacteremia   ATRIAL FIBRILLATION   Long term (current) use of anticoagulants   Popliteal artery embolus (HCC)   Sepsis due to cellulitis ([place])   Cellulitis   Wound infection   Cellulitis of right lower extremity   Streptococcal bacteremia   History of MRSA infection   Discharge Condition: stable  Diet recommendation: heart healthy  Filed Weights   03/26/17 0558 03/27/17 0315 03/28/17 0503  Weight: 123.2 kg (271 lb 9.7 oz) 121.7 kg (268 lb 4.8 oz) 120.2 kg (264 lb 15.9 oz)    History of present illness:  [name] a 72 year old female with history of atrial fibrillation, positive lupus anticoagulant, embolic stroke, DVT status post IVC filter,she was found to have an acutely ischemic right footon11/21 subsequently taken to the OR and underwent embolectomy via medial below-knee incision on 11/22, subsequently went back to the OR 12/18 forMRSA abscess with large amount of purulence evacuated and wound VAC placement in the below knee popliteal space. -Readmitted with generalized weakness and fever of 105  Hospital Course:   Sepsis/Group G streptococcal bacteremia -Source is not completely clear, mild cellulitis around the recent surgical wound most likely possibility that ID and I suspect -improved -treating with IV ceftriaxone-will likely need 2  weeks of this until 1/11 -Repeat blood negative 72 hours -2-D echocardiogram-with EF of 45%, no vegetations noted -Appreciate infectious disease input , MRI notes some myositis, but no abscess -PICC line today and then DC home with [facility] RN for ABx and Wound Care -Appreciate vascular surgery  input, Vac changed today, FU with Dr.Fields   Paroxysmal atrial fibrillation -Heart rate controlled, continue Cardizem and metoprolol -Coumadin per pharmacy, INR supratherapeutic-now improving -resumed coumadin, check INR in 1 week  History of DVT and pulmonary embolism, as well as embolic stroke and history of lupus anticoagulant -Continue Coumadin, restarted  -check INR in 1 week  History of MRSA right leg abscess -Status post incision and debridement -s/p 1 week of Vanc inpatient and then Doxy for 7days at DC -VVS following, wound appears clean now, VAC to be reapplied and changed 1/1 -Home health RN to assist and FU with Vascular  Acute ischemic limb s/p embolectomy -on 62/94  Chronic diastolic CHF and systolic -compensated, resume lasix  -based on ECHO yesterday likley has systolic CHF too, EF 76%, asymptomatic at this time, resume PO lasix -FU with Cards, do not think inpatient workup warranted at this time in setting of Becteremia etc and absence of symptoms  Full COde  Consultations:  Vascular  ID  Discharge Exam: Vitals:   03/27/17 2212 03/28/17 0503  BP: 131/63 140/84  Pulse: 84 61  Resp: 20 16  Temp: 98.4 F (36.9 C) 98.3 F (36.8 C)  SpO2: 97% 94%    General: AAOx3 Cardiovascular: S1S2/RRR Respiratory: CTAB  Discharge Instructions   Discharge Instructions    Diet - low sodium heart healthy  Complete by:  As directed    Home infusion instructions [facility] May follow [place] Dosing Protocol; May administer Cathflo as needed to maintain patency of vascular access device.; Flushing of vascular access device: per [facility] Protocol: 0.9% NaCl  pre/post medica...   Complete by:  As directed    Instructions:  May follow [place] Dosing Protocol   Instructions:  May administer Cathflo as needed to maintain patency of vascular access device.   Instructions:  Flushing of vascular access device: per [facility] Protocol: 0.9% NaCl pre/post medication administration and prn patency; Heparin 100 u/ml, 11m for implanted ports and Heparin 10u/ml, 568mfor all other central venous catheters.   Instructions:  May follow [facility] Anaphylaxis Protocol for First Dose Administration in the home: 0.9% NaCl at 25-50 ml/hr to maintain IV access for protocol meds. Epinephrine 0.3 ml IV/IM PRN and Benadryl 25-50 IV/IM PRN s/s of anaphylaxis.   Instructions:  [facility] Coord[facility] (RN) to assist per patient IV care needs in the home PRN.   Increase activity slowly   Complete by:  As directed      Allergies as of 03/28/2017      Reactions   Heparin Hives   Patient attests severe allergy- rash all over, severe itching, unable to take heparin   Other Itching, Rash   "Sheets and Linens used by MoZacarias Pontes     Medication List    TAKE these medications   acetaminophen 500 MG tablet Commonly known as:  TYLENOL Take 1,000 mg by mouth every 6 (six) hours as needed for mild pain.   aspirin 81 MG tablet Take 81 mg by mouth daily.   CARTIA XT 300 MG 24 hr capsule Generic drug:  diltiazem TAKE 1 CAPSULE BY MOUTH ONCE DAILY What changed:    how much to take  how to take this  when to take this   cefTRIAXone IVPB Commonly known as:  ROCEPHIN Inject 2 g into the vein daily for 12 days. Indication:  Group G Strep bacteremia  Last Day of Therapy: 04/07/17  Labs - Once weekly:  CBC/D and BMP, Labs - Every other week:  ESR and CRP   furosemide 40 MG tablet Commonly known as:  LASIX Take 1 tablet (40 mg total) by mouth daily. TAKE ONE TABLET BY MOUTH ONCE DAILY. NEED OFFICE VISIT FOR ADDITIONAL REFILLS.   metoprolol tartrate 25 MG  tablet Commonly known as:  LOPRESSOR Take 0.5 tablets (12.5 mg total) by mouth 2 (two) times daily. KEEP OV.   potassium chloride 10 MEQ tablet Commonly known as:  K-DUR Take 1 tablet (10 mEq total) by mouth daily.   warfarin 3 MG tablet Commonly known as:  COUMADIN Take as directed. If you are unsure how to take this medication, talk to your nurse or doctor. Original instructions:  TAKE 1&1/2 TO 2 TABLETS BY MOUTH ONCE DAILY AS DIRECTED(NEEDS INR AND MD APPT FOR FURTHER REFILLS) What changed:    how much to take  how to take this  when to take this  additional instructions            Home Infusion Instuctions  (From admission, onward)        Start     Ordered   03/28/17 0000  Home infusion instructions [facility] follow [facility]; May administer Cathflo as needed to maintain patency of vascular access device.; [facility]: 0.9% NaCl pre/post  medica...    Question Answer Comment  Instructions May follow [place] Dosing Protocol   Instructions May administer Cathflo as needed to maintain patency of vascular access device.   Instructions Flushing of vascular access device: per [facility] Protocol: 0.9% NaCl pre/post medication administration and prn patency; Heparin 100 u/ml, 35m for implanted ports and Heparin 10u/ml, 551mfor all other central venous catheters.   Instructions May follow [facility] Anaphylaxis Protocol for First Dose Administration in the home: 0.9% NaCl at 25-50 ml/hr to maintain IV access for protocol meds. Epinephrine 0.3 ml IV/IM PRN and Benadryl 25-50 IV/IM PRN s/s of anaphylaxis.   Instructions [facility] Infusion Coordinator (RN) to assist per patient IV care needs in the home PRN.      03/28/17 1141     Allergies  Allergen Reactions  . Heparin Hives    Patient attests severe allergy- rash all over, severe itching, unable to take heparin  . Other Itching and Rash    "Sheets and  Linens used by MoZacarias Pontes  Follow-up Information    PCP. Schedule an appointment as soon as possible for a visit in 1 week(s).            The results of significant diagnostics from this hospitalization (including imaging, microbiology, ancillary and laboratory) are listed below for reference.    Significant Diagnostic Studies: Dg Chest 2 View  Result Date: 03/23/2017 CLINICAL DATA:  Acute onset of fever.  Right lower leg infection. EXAM: CHEST  2 VIEW COMPARISON:  Chest radiograph performed 03/04/2017 FINDINGS: The lungs are well-aerated. Vascular congestion is noted. There is no evidence of focal opacification, pleural effusion or pneumothorax. The heart is mildly enlarged. No acute osseous abnormalities are seen. IMPRESSION: Vascular congestion and mild cardiomegaly. Lungs remain grossly clear. Electronically Signed   By: JeGarald Balding.D.   On: 03/23/2017 22:12   Dg Tibia/fibula Right  Result Date: 03/23/2017 CLINICAL DATA:  Acute onset of fever and subacute onset of right lower leg infection. EXAM: RIGHT TIBIA AND FIBULA - 2 VIEW COMPARISON:  Right foot radiographs performed 10/21/2009 FINDINGS: There is no evidence of osseous erosion. No fracture or dislocation is seen. The knee joint is unremarkable in appearance. No knee joint effusion is identified. The ankle mortise is incompletely assessed, but appears grossly unremarkable. Soft tissue swelling is noted about the proximal lower leg. Postoperative change is seen at the proximal lower leg. IMPRESSION: No evidence of osseous erosion. Soft tissue swelling noted about the proximal lower leg. Electronically Signed   By: JeGarald Balding.D.   On: 03/23/2017 22:13   Mr Tibia Fibula Right Wo Contrast  Result Date: 03/26/2017 CLINICAL DATA:  Fever.  History of a right leg wound infection. EXAM: MRI OF LOWER RIGHT EXTREMITY WITHOUT CONTRAST TECHNIQUE: Multiplanar, multisequence MR imaging of the right lower extremity was performed. No  intravenous contrast was administered. COMPARISON:  Radiographs 03/23/2017 FINDINGS: There is a large and deep open wound involving the medial aspect of the right lower extremity just below the knee joint. This extends into the adjacent musculature. No discrete drainable soft tissue abscess is identified. There is mild diffuse cellulitis and myositis but no evidence of pyomyositis. No MR findings to suggest septic arthritis or osteomyelitis. IMPRESSION: 1. Large open wound involving the medial aspect of the right lower extremity with probable granulation tissue. No discrete drainable abscess or significant findings of cellulitis. There is focal myositis involving the adjacent upper calf musculature but no evidence of pyomyositis. 2.  No findings to suggest septic arthritis or osteomyelitis. Electronically Signed   By: Marijo Sanes M.D.   On: 03/26/2017 17:33   Dg Chest Port 1 View  Result Date: 03/04/2017 CLINICAL DATA:  Blood clot RIGHT leg removed after Thanksgiving, now with swelling and redness at site, sepsis, history of heart failure, pulmonary embolism, DVT, hypertension EXAM: PORTABLE CHEST 1 VIEW COMPARISON:  Portable exam 1241 hours compared to 06/06/2010 FINDINGS: Enlargement of cardiac silhouette. Mediastinal contours and pulmonary vascularity normal. Lungs clear. No pleural effusion or pneumothorax. Bones demineralized. IMPRESSION: Enlargement of cardiac silhouette. No acute abnormalities. Electronically Signed   By: Lavonia Dana M.D.   On: 03/04/2017 13:15    Microbiology: Recent Results (from the past 240 hour(s))  Blood culture (routine x 2)     Status: Abnormal   Collection Time: 03/23/17  8:00 PM  Result Value Ref Range Status   Specimen Description BLOOD RIGHT ANTECUBITAL  Final   Special Requests   Final    BOTTLES DRAWN AEROBIC AND ANAEROBIC Blood Culture adequate volume   Culture  Setup Time   Final    GRAM POSITIVE COCCI IN CHAINS IN BOTH AEROBIC AND ANAEROBIC BOTTLES CRITICAL  VALUE NOTED.  VALUE IS CONSISTENT WITH PREVIOUSLY REPORTED AND CALLED VALUE.    Culture (A)  Final    STREPTOCOCCUS GROUP G SUSCEPTIBILITIES PERFORMED ON PREVIOUS CULTURE WITHIN THE LAST 5 DAYS.    Report Status 03/26/2017 FINAL  Final  Blood culture (routine x 2)     Status: Abnormal   Collection Time: 03/23/17  8:35 PM  Result Value Ref Range Status   Specimen Description BLOOD LEFT ANTECUBITAL  Final   Special Requests   Final    BOTTLES DRAWN AEROBIC AND ANAEROBIC Blood Culture adequate volume   Culture  Setup Time   Final    GRAM POSITIVE COCCI IN CHAINS IN BOTH AEROBIC AND ANAEROBIC BOTTLES CRITICAL RESULT CALLED TO, READ BACK BY AND VERIFIED WITH: Truett Perna.D. 10:40 03/24/17 ([email or web address])    Culture STREPTOCOCCUS GROUP G (A)  Final   Report Status 03/26/2017 FINAL  Final   Organism ID, Bacteria STREPTOCOCCUS GROUP G  Final      Susceptibility   Streptococcus group g - MIC*    CLINDAMYCIN <=0.25 SENSITIVE Sensitive     AMPICILLIN <=0.25 SENSITIVE Sensitive     ERYTHROMYCIN <=0.12 SENSITIVE Sensitive     VANCOMYCIN 0.25 SENSITIVE Sensitive     CEFTRIAXONE <=0.12 SENSITIVE Sensitive     LEVOFLOXACIN 1 SENSITIVE Sensitive     PENICILLIN Value in next row Sensitive      SENSITIVE<=0.06    * STREPTOCOCCUS GROUP G  Blood Culture ID Panel (Reflexed)     Status: Abnormal   Collection Time: 03/23/17  8:35 PM  Result Value Ref Range Status   Enterococcus species NOT DETECTED NOT DETECTED Final   Listeria monocytogenes NOT DETECTED NOT DETECTED Final   Staphylococcus species NOT DETECTED NOT DETECTED Final   Staphylococcus aureus NOT DETECTED NOT DETECTED Final   Streptococcus species DETECTED (A) NOT DETECTED Final    Comment: Not Enterococcus species, Streptococcus agalactiae, Streptococcus pyogenes, or Streptococcus pneumoniae. CRITICAL RESULT CALLED TO, READ BACK BY AND VERIFIED WITH: M. Radford Pax Pharm.D. 10:40 03/24/17 ([email or web address])    Streptococcus agalactiae NOT DETECTED NOT  DETECTED Final   Streptococcus pneumoniae NOT DETECTED NOT DETECTED Final   Streptococcus pyogenes NOT DETECTED NOT DETECTED Final   Acinetobacter baumannii NOT DETECTED NOT DETECTED Final   Enterobacteriaceae species NOT DETECTED NOT DETECTED  Final   Enterobacter cloacae complex NOT DETECTED NOT DETECTED Final   Escherichia coli NOT DETECTED NOT DETECTED Final   Klebsiella oxytoca NOT DETECTED NOT DETECTED Final   Klebsiella pneumoniae NOT DETECTED NOT DETECTED Final   Proteus species NOT DETECTED NOT DETECTED Final   Serratia marcescens NOT DETECTED NOT DETECTED Final   Haemophilus influenzae NOT DETECTED NOT DETECTED Final   Neisseria meningitidis NOT DETECTED NOT DETECTED Final   Pseudomonas aeruginosa NOT DETECTED NOT DETECTED Final   Candida albicans NOT DETECTED NOT DETECTED Final   Candida glabrata NOT DETECTED NOT DETECTED Final   Candida krusei NOT DETECTED NOT DETECTED Final   Candida parapsilosis NOT DETECTED NOT DETECTED Final   Candida tropicalis NOT DETECTED NOT DETECTED Final  MRSA PCR Screening     Status: None   Collection Time: 03/24/17  1:49 AM  Result Value Ref Range Status   MRSA by PCR NEGATIVE NEGATIVE Final    Comment:        The GeneXpert MRSA Assay (FDA approved for NASAL specimens only), is one component of a comprehensive MRSA colonization surveillance program. It is not intended to diagnose MRSA infection nor to guide or monitor treatment for MRSA infections.   Culture, blood (routine x 2)     Status: None (Preliminary result)   Collection Time: 03/25/17  7:45 AM  Result Value Ref Range Status   Specimen Description BLOOD LEFT ANTECUBITAL  Final   Special Requests IN PEDIATRIC BOTTLE Blood Culture adequate volume  Final   Culture NO GROWTH 2 DAYS  Final   Report Status PENDING  Incomplete  Culture, blood (routine x 2)     Status: None (Preliminary result)   Collection Time: 03/25/17  7:50 AM  Result Value Ref Range Status   Specimen  Description BLOOD LEFT HAND  Final   Special Requests IN PEDIATRIC BOTTLE Blood Culture adequate volume  Final   Culture NO GROWTH 2 DAYS  Final   Report Status PENDING  Incomplete     Labs: Basic Metabolic Panel: Recent Labs  Lab 03/24/17 0230 03/25/17 0749 03/26/17 0348 03/27/17 0226 03/28/17 0408  NA 136 139 137 138 138  K 3.7 4.0 3.8 3.7 3.7  CL 100* 104 104 104 104  CO2 '25 28 25 24 26  ' GLUCOSE 137* 101* 90 86 88  BUN '14 19 18 15 9  ' CREATININE 1.30* 1.76* 1.55* 1.22* 1.21*  CALCIUM 7.9* 8.3* 7.9* 8.1* 8.3*   Liver Function Tests: Recent Labs  Lab 03/23/17 1947  AST 23  ALT 15  ALKPHOS 91  BILITOT 1.2  PROT 8.3*  ALBUMIN 3.0*   No results for input(s): LIPASE, AMYLASE in the last 168 hours. No results for input(s): AMMONIA in the last 168 hours. CBC: Recent Labs  Lab 03/23/17 1947 03/24/17 0230 03/25/17 0749 03/26/17 0348 03/28/17 0408  WBC 11.4* 19.8* 7.6 5.5 7.6  NEUTROABS 10.0*  --   --   --   --   HGB 14.2 12.3 11.7* 11.0* 11.6*  HCT 42.7 38.0 37.1 34.6* 36.4  MCV 98.6 100.3* 98.7 100.6* 100.6*  PLT 139* 128* 118* 123* 159   Cardiac Enzymes: No results for input(s): CKTOTAL, CKMB, CKMBINDEX, TROPONINI in the last 168 hours. BNP: BNP (last 3 results) No results for input(s): BNP in the last 8760 hours.  ProBNP (last 3 results) No results for input(s): PROBNP in the last 8760 hours.  CBG: No results for input(s): GLUCAP in the last 168 hours.  Signed:  Domenic Polite MD.  [facility] 03/28/2017, 11:50 AM

## 2017-03-28 NOTE — Progress Notes (Signed)
Pt given discharge instructions, prescriptions, and care notes. Pt verbalized understanding AEB no further questions or concerns at this time. IV was discontinued, no redness, pain, or swelling noted at this time. Picc line in place for home IV therapies. Telemetry discontinued and Centralized Telemetry was notified. Pt left the floor via wheelchair with staff in stable condition.

## 2017-03-28 NOTE — Progress Notes (Signed)
Peripherally Inserted Central Catheter/Midline Placement  The IV Nurse has discussed with the patient and/or persons authorized to consent for the patient, the purpose of this procedure and the potential benefits and risks involved with this procedure.  The benefits include less needle sticks, lab draws from the catheter, and the patient may be discharged home with the catheter. Risks include, but not limited to, infection, bleeding, blood clot (thrombus formation), and puncture of an artery; nerve damage and irregular heartbeat and possibility to perform a PICC exchange if needed/ordered by physician.  Alternatives to this procedure were also discussed.  Bard Power PICC patient education guide, fact sheet on infection prevention and patient information card has been provided to patient /or left at bedside.    PICC/Midline Placement Documentation  PICC Single Lumen 37/54/36 PICC Right Basilic 40 cm (Active)  Indication for Insertion or Continuance of Line Home intravenous therapies (PICC only) 03/28/2017  4:00 PM  Exposed Catheter (cm) 1 cm 03/28/2017  4:00 PM  Site Assessment Clean;Dry;Intact 03/28/2017  4:00 PM  Line Status Flushed;Blood return noted 03/28/2017  4:00 PM  Dressing Type Transparent 03/28/2017  4:00 PM  Dressing Status Clean;Dry;Intact;Antimicrobial disc in place 03/28/2017  4:00 PM  Dressing Change Due 04/04/17 03/28/2017  4:00 PM       Jule Economy Horton 03/28/2017, 4:06 PM

## 2017-03-28 NOTE — Progress Notes (Signed)
Spoke with Clarise Cruz RN re PICC placement once approved by ID per Dr Delene Loll progress note.

## 2017-03-28 NOTE — Progress Notes (Signed)
Patient ID: [name], female   DOB: 11-24-1945, 72 y.o.   MRN: [id number] Patient has VAC in place.  Taking her bath this morning.  Did not examine.  Okay for discharge from vascular surgery standpoint

## 2017-03-28 NOTE — Progress Notes (Signed)
D/w ID Dr.VanDam, ok to place PICC line and plan for DC home on IV ceftriaxone till 1/11, Full DC summary to follow  Domenic Polite, MD

## 2017-03-29 ENCOUNTER — Ambulatory Visit (INDEPENDENT_AMBULATORY_CARE_PROVIDER_SITE_OTHER): Payer: Medicare Other | Admitting: Internal Medicine

## 2017-03-29 DIAGNOSIS — I4891 Unspecified atrial fibrillation: Secondary | ICD-10-CM

## 2017-03-29 DIAGNOSIS — L02415 Cutaneous abscess of right lower limb: Secondary | ICD-10-CM | POA: Diagnosis not present

## 2017-03-29 DIAGNOSIS — T8141XD Infection following a procedure, superficial incisional surgical site, subsequent encounter: Secondary | ICD-10-CM | POA: Diagnosis not present

## 2017-03-29 DIAGNOSIS — Z5181 Encounter for therapeutic drug level monitoring: Secondary | ICD-10-CM

## 2017-03-29 DIAGNOSIS — I743 Embolism and thrombosis of arteries of the lower extremities: Secondary | ICD-10-CM | POA: Diagnosis not present

## 2017-03-29 DIAGNOSIS — I110 Hypertensive heart disease with heart failure: Secondary | ICD-10-CM | POA: Diagnosis not present

## 2017-03-29 DIAGNOSIS — L03115 Cellulitis of right lower limb: Secondary | ICD-10-CM | POA: Diagnosis not present

## 2017-03-29 DIAGNOSIS — I5030 Unspecified diastolic (congestive) heart failure: Secondary | ICD-10-CM | POA: Diagnosis not present

## 2017-03-29 LAB — POCT INR: INR: 3.9

## 2017-03-29 NOTE — Patient Instructions (Signed)
Description   Skip today's dose, then continue taking 2 tablets daily except 1.5 tablets on Mondays, Wednesdays and Fridays. Recheck on Monday. Orders given to [facility] nurse.

## 2017-03-30 LAB — CULTURE, BLOOD (ROUTINE X 2)
CULTURE: NO GROWTH
CULTURE: NO GROWTH
SPECIAL REQUESTS: ADEQUATE
Special Requests: ADEQUATE

## 2017-03-31 ENCOUNTER — Encounter: Payer: Self-pay | Admitting: Cardiology

## 2017-03-31 ENCOUNTER — Other Ambulatory Visit: Payer: Self-pay | Admitting: *Deleted

## 2017-03-31 ENCOUNTER — Ambulatory Visit (INDEPENDENT_AMBULATORY_CARE_PROVIDER_SITE_OTHER): Payer: Medicare Other | Admitting: Cardiology

## 2017-03-31 DIAGNOSIS — Z7901 Long term (current) use of anticoagulants: Secondary | ICD-10-CM

## 2017-03-31 DIAGNOSIS — I110 Hypertensive heart disease with heart failure: Secondary | ICD-10-CM | POA: Diagnosis not present

## 2017-03-31 DIAGNOSIS — L03115 Cellulitis of right lower limb: Secondary | ICD-10-CM | POA: Diagnosis not present

## 2017-03-31 DIAGNOSIS — I5030 Unspecified diastolic (congestive) heart failure: Secondary | ICD-10-CM | POA: Diagnosis not present

## 2017-03-31 DIAGNOSIS — I428 Other cardiomyopathies: Secondary | ICD-10-CM | POA: Insufficient documentation

## 2017-03-31 DIAGNOSIS — Z862 Personal history of diseases of the blood and blood-forming organs and certain disorders involving the immune mechanism: Secondary | ICD-10-CM

## 2017-03-31 DIAGNOSIS — I4821 Permanent atrial fibrillation: Secondary | ICD-10-CM

## 2017-03-31 DIAGNOSIS — T8141XD Infection following a procedure, superficial incisional surgical site, subsequent encounter: Secondary | ICD-10-CM | POA: Diagnosis not present

## 2017-03-31 DIAGNOSIS — R609 Edema, unspecified: Secondary | ICD-10-CM

## 2017-03-31 DIAGNOSIS — I743 Embolism and thrombosis of arteries of the lower extremities: Secondary | ICD-10-CM

## 2017-03-31 DIAGNOSIS — I482 Chronic atrial fibrillation: Secondary | ICD-10-CM

## 2017-03-31 DIAGNOSIS — I4891 Unspecified atrial fibrillation: Secondary | ICD-10-CM | POA: Diagnosis not present

## 2017-03-31 DIAGNOSIS — L02415 Cutaneous abscess of right lower limb: Secondary | ICD-10-CM | POA: Diagnosis not present

## 2017-03-31 MED ORDER — FUROSEMIDE 40 MG PO TABS: 40.0000 mg | ORAL_TABLET | Freq: Every day | ORAL | 3 refills | Status: DC

## 2017-03-31 MED ORDER — POTASSIUM CHLORIDE ER 10 MEQ PO TBCR: 10.0000 meq | EXTENDED_RELEASE_TABLET | Freq: Every day | ORAL | 3 refills | Status: DC

## 2017-03-31 NOTE — Assessment & Plan Note (Signed)
Presumed NICM. EF 40-45% with massive LAE, (no AS seen) Dec 2018

## 2017-03-31 NOTE — Progress Notes (Signed)
03/31/2017 [name]   22-Jun-1945  [id number]  Primary Physician Melody Huddle, MD Primary Cardiologist: Dr [name]  HPI:  72 y/o obese female with a history of permanent Afib.  She has been rate controlled over the years on oral dilt and ? blocker.  She is chronically anticoagulated on coumadin. She also has a history of Lupus anticoagulant (detected 06/2008), she has also had multiple embolic events including DVT and bilat PE in 08/2005 (s/p IVC filter), bilat femoral artery emboli s/p bilat embolectomies in 05/7167, and embolic CVA in 08/7891 requiring TPA with subsequent hemorrhagic conversion, and most recently, RLE popliteal  Embolism (in setting of subtherapeutic INR of 1.43) requiring surgery 02/16/17. She developed a wound infection requiring surgery 03/05/17 and wound vac. She is in the office today for cardiology f/u. She has done well from a cardiac standpoint, she is unaware of her AF. An echo done during her hospitalization showed her EF to be 40-45% which is down from her last echo. Interestingly mild to moderate AS seen in the past was not noted on this echo. Her INR is now followed in our Coumadin clinic.    Current Outpatient Medications  Medication Sig Dispense Refill  . acetaminophen (TYLENOL) 500 MG tablet Take 1,000 mg by mouth every 6 (six) hours as needed for mild pain.    Marland Kitchen aspirin 81 MG tablet Take 81 mg by mouth daily.      Marland Kitchen CARTIA XT 300 MG 24 hr capsule TAKE 1 CAPSULE BY MOUTH ONCE DAILY (Patient taking differently: TAKE 300 mg CAPSULE BY MOUTH ONCE DAILY) 90 capsule 0  . cefTRIAXone (ROCEPHIN) IVPB Inject 2 g into the vein daily for 12 days. Indication:  Group G Strep bacteremia  Last Day of Therapy: 04/07/17  Labs - Once weekly:  CBC/D and BMP, Labs - Every other week:  ESR and CRP 12 Units 0  . furosemide (LASIX) 40 MG tablet Take 1 tablet (40 mg total) by mouth daily. TAKE ONE TABLET BY MOUTH ONCE DAILY. NEED OFFICE VISIT FOR ADDITIONAL REFILLS. 30 tablet 0    . metoprolol tartrate (LOPRESSOR) 25 MG tablet Take 0.5 tablets (12.5 mg total) by mouth 2 (two) times daily. KEEP OV. 90 tablet 0  . warfarin (COUMADIN) 3 MG tablet TAKE 1&1/2 TO 2 TABLETS BY MOUTH ONCE DAILY AS DIRECTED(NEEDS INR AND MD APPT FOR FURTHER REFILLS) (Patient taking differently: Take 3 mg by mouth See admin instructions. Take 6 mg On Tuesday , Thursday, Saturday and Sunday Take 4.5 mg on Monday Wednesday and Friday) 70 tablet 1  . potassium chloride (K-DUR) 10 MEQ tablet Take 1 tablet (10 mEq total) by mouth daily. 30 tablet 11   No current facility-administered medications for this visit.     Allergies  Allergen Reactions  . Heparin Hives    Patient attests severe allergy- rash all over, severe itching, unable to take heparin  . Other Itching and Rash    "Sheets and Linens used by [name]"    Past Medical History:  Diagnosis Date  . (HFpEF) heart failure with preserved ejection fraction ([place])    a. 06/2008 Echo: EF 55-60%, mild LVH, triv AI, mild to mod MS, mod to sev dil LA, mildly dil RA.  Marland Kitchen Acute right MCA stroke ([place])    a. 10/26/173 Embolic stroke treated with TPA and thrombectomy with hemorrhagic transformation; Carotids 3/12 negative for ICA stenosis.  . Bilateral Pulmonary Emboli    a. 08/2005 - s/p IVC filter.  Marland Kitchen  Depression   . Embolus of femoral artery ([place])    a. 06/2008 s/p bilateral embolectomies.  . History of DVT (deep vein thrombosis)    a. s/p IVC filter.  Marland Kitchen HIT (heparin-induced thrombocytopenia) ([place])   . HTN (hypertension)   . Lupus anticoagulant disorder ([place])   . Obesity, unspecified   . Permanent atrial fibrillation ([place])    a. On chronic Coumadin - INRs followed by PCP; b. CHA2DS2VASc = 5.  . Popliteal artery embolism, right ([place])    a. 01/2017 in the setting of subRx INR-->s/p embolectomy.    Social History   Socioeconomic History  . Marital status: Single    Spouse name: Not on file  . Number of children: Not on file  . Years of  education: Not on file  . Highest education level: Not on file  Social Needs  . Financial resource strain: Not on file  . Food insecurity - worry: Not on file  . Food insecurity - inability: Not on file  . Transportation needs - medical: Not on file  . Transportation needs - non-medical: Not on file  Occupational History  . Not on file  Tobacco Use  . Smoking status: Never Smoker  . Smokeless tobacco: Never Used  Substance and Sexual Activity  . Alcohol use: No  . Drug use: No  . Sexual activity: Not Currently  Other Topics Concern  . Not on file  Social History Narrative    Lives alone.  She works at a [employer].   Daughter in area to assist if needed.  One-level home, 5-6 steps to   entry.  Functional history prior to admission was independent,   functional status upon admission to rehab services was +2, total assist   bed mobility,+2 total assist transfers, ambulation not tested, moderate   assist upper body, total assist lower body, activities of daily living.        Family History  Problem Relation Age of Onset  . Coronary artery disease Unknown   . Diabetes Unknown      Review of Systems: General: negative for chills, fever, night sweats or weight changes.  Cardiovascular: negative for chest pain, dyspnea on exertion, edema, orthopnea, palpitations, paroxysmal nocturnal dyspnea or shortness of breath Dermatological: negative for rash Respiratory: negative for cough or wheezing Urologic: negative for hematuria Abdominal: negative for nausea, vomiting, diarrhea, bright red blood per rectum, melena, or hematemesis Neurologic: negative for visual changes, syncope, or dizziness All other systems reviewed and are otherwise negative except as noted above.    Blood pressure (!) 208/115, pulse 76, height 5' 6" (1.676 m), weight 264 lb (119.7 kg).  General appearance: alert, cooperative, no distress and morbidly obese Lungs: clear to auscultation  bilaterally Heart: irregularly irregular rhythm Extremities: wound vac in place Skin: warm and dry Neurologic: Grossly normal   ASSESSMENT AND PLAN:   Cellulitis of right lower extremity S/P Rt popliteal embolectomy requiring surgery in Nov 2018 followed by I&D Dec 2018 and a wound vac.  NICM (nonischemic cardiomyopathy) ([place]) Presumed NICM. EF 40-45% with massive LAE, (no AS seen) Dec 2018  Permanent atrial fibrillation [facility]) With multiple embolic episodes  History of lupus anticoagulant disorder .  Long term (current) use of anticoagulants She needs tight control- cannot let INR drop below 2.0   PLAN  Repeat B/P 159/92. Her daughter tells me she hasn't taken her medications yet today. I asked her daughter to keep track of her B/P at home and let us know is  she is consistently running > 412 systolic. F/U Dr [name] in 3 months.   Kerin Ransom PA-C 03/31/2017 9:41 AM

## 2017-03-31 NOTE — Patient Instructions (Signed)
Medication Instructions:  Your physician recommends that you continue on your current medications as directed. Please refer to the Current Medication list given to you today.  Follow-Up: Your physician recommends that you schedule a follow-up appointment in: 3 months with Dr. Percival Spanish   Any Other Special Instructions Will Be Listed Below (If Applicable).  Please monitor your blood pressure at home, call if consistently greater than 135-140.   If you need a refill on your cardiac medications before your next appointment, please call your pharmacy.

## 2017-03-31 NOTE — Assessment & Plan Note (Signed)
With multiple embolic episodes

## 2017-03-31 NOTE — Assessment & Plan Note (Signed)
S/P Rt popliteal embolectomy requiring surgery in Nov 2018 followed by I&D Dec 2018 and a wound vac.

## 2017-03-31 NOTE — Assessment & Plan Note (Signed)
She needs tight control- cannot let INR drop below 2.0

## 2017-04-03 ENCOUNTER — Ambulatory Visit (INDEPENDENT_AMBULATORY_CARE_PROVIDER_SITE_OTHER): Payer: Medicare Other | Admitting: Pharmacist

## 2017-04-03 DIAGNOSIS — Z86711 Personal history of pulmonary embolism: Secondary | ICD-10-CM

## 2017-04-03 DIAGNOSIS — L03115 Cellulitis of right lower limb: Secondary | ICD-10-CM | POA: Diagnosis not present

## 2017-04-03 DIAGNOSIS — I482 Chronic atrial fibrillation: Secondary | ICD-10-CM | POA: Diagnosis not present

## 2017-04-03 DIAGNOSIS — Z5181 Encounter for therapeutic drug level monitoring: Secondary | ICD-10-CM | POA: Diagnosis not present

## 2017-04-03 DIAGNOSIS — I5030 Unspecified diastolic (congestive) heart failure: Secondary | ICD-10-CM | POA: Diagnosis not present

## 2017-04-03 DIAGNOSIS — T8141XD Infection following a procedure, superficial incisional surgical site, subsequent encounter: Secondary | ICD-10-CM | POA: Diagnosis not present

## 2017-04-03 DIAGNOSIS — I4891 Unspecified atrial fibrillation: Secondary | ICD-10-CM | POA: Diagnosis not present

## 2017-04-03 DIAGNOSIS — I743 Embolism and thrombosis of arteries of the lower extremities: Secondary | ICD-10-CM | POA: Diagnosis not present

## 2017-04-03 DIAGNOSIS — L02415 Cutaneous abscess of right lower limb: Secondary | ICD-10-CM | POA: Diagnosis not present

## 2017-04-03 DIAGNOSIS — I4821 Permanent atrial fibrillation: Secondary | ICD-10-CM

## 2017-04-03 DIAGNOSIS — I110 Hypertensive heart disease with heart failure: Secondary | ICD-10-CM | POA: Diagnosis not present

## 2017-04-03 LAB — POCT INR: INR: 4

## 2017-04-05 DIAGNOSIS — L02415 Cutaneous abscess of right lower limb: Secondary | ICD-10-CM | POA: Diagnosis not present

## 2017-04-05 DIAGNOSIS — T8141XD Infection following a procedure, superficial incisional surgical site, subsequent encounter: Secondary | ICD-10-CM | POA: Diagnosis not present

## 2017-04-05 DIAGNOSIS — I4891 Unspecified atrial fibrillation: Secondary | ICD-10-CM | POA: Diagnosis not present

## 2017-04-05 DIAGNOSIS — L03115 Cellulitis of right lower limb: Secondary | ICD-10-CM | POA: Diagnosis not present

## 2017-04-05 DIAGNOSIS — I5030 Unspecified diastolic (congestive) heart failure: Secondary | ICD-10-CM | POA: Diagnosis not present

## 2017-04-05 DIAGNOSIS — I110 Hypertensive heart disease with heart failure: Secondary | ICD-10-CM | POA: Diagnosis not present

## 2017-04-07 DIAGNOSIS — T8141XD Infection following a procedure, superficial incisional surgical site, subsequent encounter: Secondary | ICD-10-CM | POA: Diagnosis not present

## 2017-04-07 DIAGNOSIS — I5030 Unspecified diastolic (congestive) heart failure: Secondary | ICD-10-CM | POA: Diagnosis not present

## 2017-04-07 DIAGNOSIS — I110 Hypertensive heart disease with heart failure: Secondary | ICD-10-CM | POA: Diagnosis not present

## 2017-04-07 DIAGNOSIS — L02415 Cutaneous abscess of right lower limb: Secondary | ICD-10-CM | POA: Diagnosis not present

## 2017-04-07 DIAGNOSIS — I4891 Unspecified atrial fibrillation: Secondary | ICD-10-CM | POA: Diagnosis not present

## 2017-04-07 DIAGNOSIS — L03115 Cellulitis of right lower limb: Secondary | ICD-10-CM | POA: Diagnosis not present

## 2017-04-10 ENCOUNTER — Ambulatory Visit (INDEPENDENT_AMBULATORY_CARE_PROVIDER_SITE_OTHER): Payer: Medicare Other | Admitting: Cardiovascular Disease

## 2017-04-10 DIAGNOSIS — Z5181 Encounter for therapeutic drug level monitoring: Secondary | ICD-10-CM

## 2017-04-10 DIAGNOSIS — I482 Chronic atrial fibrillation: Secondary | ICD-10-CM | POA: Diagnosis not present

## 2017-04-10 DIAGNOSIS — I743 Embolism and thrombosis of arteries of the lower extremities: Secondary | ICD-10-CM | POA: Diagnosis not present

## 2017-04-10 DIAGNOSIS — I4821 Permanent atrial fibrillation: Secondary | ICD-10-CM

## 2017-04-10 DIAGNOSIS — I5030 Unspecified diastolic (congestive) heart failure: Secondary | ICD-10-CM | POA: Diagnosis not present

## 2017-04-10 DIAGNOSIS — L02415 Cutaneous abscess of right lower limb: Secondary | ICD-10-CM | POA: Diagnosis not present

## 2017-04-10 DIAGNOSIS — Z86711 Personal history of pulmonary embolism: Secondary | ICD-10-CM | POA: Diagnosis not present

## 2017-04-10 DIAGNOSIS — L03115 Cellulitis of right lower limb: Secondary | ICD-10-CM | POA: Diagnosis not present

## 2017-04-10 DIAGNOSIS — I110 Hypertensive heart disease with heart failure: Secondary | ICD-10-CM | POA: Diagnosis not present

## 2017-04-10 DIAGNOSIS — T8141XD Infection following a procedure, superficial incisional surgical site, subsequent encounter: Secondary | ICD-10-CM | POA: Diagnosis not present

## 2017-04-10 DIAGNOSIS — I4891 Unspecified atrial fibrillation: Secondary | ICD-10-CM | POA: Diagnosis not present

## 2017-04-10 LAB — POCT INR: INR: 4.8

## 2017-04-11 ENCOUNTER — Inpatient Hospital Stay: Payer: Medicare Other | Admitting: Infectious Disease

## 2017-04-12 DIAGNOSIS — I4891 Unspecified atrial fibrillation: Secondary | ICD-10-CM | POA: Diagnosis not present

## 2017-04-12 DIAGNOSIS — I482 Chronic atrial fibrillation: Secondary | ICD-10-CM | POA: Diagnosis not present

## 2017-04-12 DIAGNOSIS — I110 Hypertensive heart disease with heart failure: Secondary | ICD-10-CM | POA: Diagnosis not present

## 2017-04-12 DIAGNOSIS — F322 Major depressive disorder, single episode, severe without psychotic features: Secondary | ICD-10-CM | POA: Diagnosis not present

## 2017-04-12 DIAGNOSIS — Z79899 Other long term (current) drug therapy: Secondary | ICD-10-CM | POA: Diagnosis not present

## 2017-04-12 DIAGNOSIS — T8141XD Infection following a procedure, superficial incisional surgical site, subsequent encounter: Secondary | ICD-10-CM | POA: Diagnosis not present

## 2017-04-12 DIAGNOSIS — I10 Essential (primary) hypertension: Secondary | ICD-10-CM | POA: Diagnosis not present

## 2017-04-12 DIAGNOSIS — I63511 Cerebral infarction due to unspecified occlusion or stenosis of right middle cerebral artery: Secondary | ICD-10-CM | POA: Diagnosis not present

## 2017-04-12 DIAGNOSIS — I70209 Unspecified atherosclerosis of native arteries of extremities, unspecified extremity: Secondary | ICD-10-CM | POA: Diagnosis not present

## 2017-04-12 DIAGNOSIS — L02415 Cutaneous abscess of right lower limb: Secondary | ICD-10-CM | POA: Diagnosis not present

## 2017-04-12 DIAGNOSIS — D682 Hereditary deficiency of other clotting factors: Secondary | ICD-10-CM | POA: Diagnosis not present

## 2017-04-12 DIAGNOSIS — E559 Vitamin D deficiency, unspecified: Secondary | ICD-10-CM | POA: Diagnosis not present

## 2017-04-12 DIAGNOSIS — I5030 Unspecified diastolic (congestive) heart failure: Secondary | ICD-10-CM | POA: Diagnosis not present

## 2017-04-12 DIAGNOSIS — L03115 Cellulitis of right lower limb: Secondary | ICD-10-CM | POA: Diagnosis not present

## 2017-04-12 DIAGNOSIS — L02419 Cutaneous abscess of limb, unspecified: Secondary | ICD-10-CM | POA: Diagnosis not present

## 2017-04-14 DIAGNOSIS — L02415 Cutaneous abscess of right lower limb: Secondary | ICD-10-CM | POA: Diagnosis not present

## 2017-04-14 DIAGNOSIS — I5030 Unspecified diastolic (congestive) heart failure: Secondary | ICD-10-CM | POA: Diagnosis not present

## 2017-04-14 DIAGNOSIS — L03115 Cellulitis of right lower limb: Secondary | ICD-10-CM | POA: Diagnosis not present

## 2017-04-14 DIAGNOSIS — T8141XD Infection following a procedure, superficial incisional surgical site, subsequent encounter: Secondary | ICD-10-CM | POA: Diagnosis not present

## 2017-04-14 DIAGNOSIS — I110 Hypertensive heart disease with heart failure: Secondary | ICD-10-CM | POA: Diagnosis not present

## 2017-04-14 DIAGNOSIS — I4891 Unspecified atrial fibrillation: Secondary | ICD-10-CM | POA: Diagnosis not present

## 2017-04-16 DIAGNOSIS — I4891 Unspecified atrial fibrillation: Secondary | ICD-10-CM | POA: Diagnosis not present

## 2017-04-16 DIAGNOSIS — L03115 Cellulitis of right lower limb: Secondary | ICD-10-CM | POA: Diagnosis not present

## 2017-04-16 DIAGNOSIS — I5030 Unspecified diastolic (congestive) heart failure: Secondary | ICD-10-CM | POA: Diagnosis not present

## 2017-04-16 DIAGNOSIS — T8141XD Infection following a procedure, superficial incisional surgical site, subsequent encounter: Secondary | ICD-10-CM | POA: Diagnosis not present

## 2017-04-16 DIAGNOSIS — L02415 Cutaneous abscess of right lower limb: Secondary | ICD-10-CM | POA: Diagnosis not present

## 2017-04-16 DIAGNOSIS — I110 Hypertensive heart disease with heart failure: Secondary | ICD-10-CM | POA: Diagnosis not present

## 2017-04-17 ENCOUNTER — Ambulatory Visit (INDEPENDENT_AMBULATORY_CARE_PROVIDER_SITE_OTHER): Payer: Medicare Other | Admitting: Infectious Disease

## 2017-04-17 ENCOUNTER — Encounter: Payer: Self-pay | Admitting: Infectious Disease

## 2017-04-17 VITALS — BP 146/82 | HR 101 | Temp 97.5°F | Wt 268.0 lb

## 2017-04-17 DIAGNOSIS — A419 Sepsis, unspecified organism: Secondary | ICD-10-CM

## 2017-04-17 DIAGNOSIS — T148XXA Other injury of unspecified body region, initial encounter: Secondary | ICD-10-CM | POA: Diagnosis not present

## 2017-04-17 DIAGNOSIS — B955 Unspecified streptococcus as the cause of diseases classified elsewhere: Secondary | ICD-10-CM

## 2017-04-17 DIAGNOSIS — R7881 Bacteremia: Secondary | ICD-10-CM | POA: Diagnosis not present

## 2017-04-17 DIAGNOSIS — I743 Embolism and thrombosis of arteries of the lower extremities: Secondary | ICD-10-CM | POA: Diagnosis not present

## 2017-04-17 DIAGNOSIS — L0390 Cellulitis, unspecified: Secondary | ICD-10-CM

## 2017-04-17 DIAGNOSIS — L089 Local infection of the skin and subcutaneous tissue, unspecified: Secondary | ICD-10-CM

## 2017-04-17 NOTE — Progress Notes (Signed)
Subjective:   Cc:" I want to get the wound vac off"   Patient ID: [name], female    DOB: 08-10-1945, 72 y.o.   MRN: [id number]  HPI   72 y.o. female with  PAD, ischemic right leg s/p embolectomy c/b MRSA wound s/p I x D in early December who now found to have streptococcal G bacteremia. She had TEE which was negative and imaging of the leg that did not reveal deeper infection. She finished 2 weeks of Ceftriaxone without problems. She still has the wound vacuum on her right leg. SHe is to see [facility] this Thursday.  Past Medical History:  Diagnosis Date  . (HFpEF) heart failure with preserved ejection fraction ([place])    a. 06/2008 Echo: EF 55-60%, mild LVH, triv AI, mild to mod MS, mod to sev dil LA, mildly dil RA.  Marland Kitchen Acute right MCA stroke ([place])    a. 08/03/1636 Embolic stroke treated with TPA and thrombectomy with hemorrhagic transformation; Carotids 3/12 negative for ICA stenosis.  . Bilateral Pulmonary Emboli    a. 08/2005 - s/p IVC filter.  . Depression   . Embolus of femoral artery ([place])    a. 06/2008 s/p bilateral embolectomies.  . History of DVT (deep vein thrombosis)    a. s/p IVC filter.  Marland Kitchen HIT (heparin-induced thrombocytopenia) ([place])   . HTN (hypertension)   . Lupus anticoagulant disorder ([place])   . Obesity, unspecified   . Permanent atrial fibrillation ([place])    a. On chronic Coumadin - INRs followed by PCP; b. CHA2DS2VASc = 5.  . Popliteal artery embolism, right ([place])    a. 01/2017 in the setting of subRx INR-->s/p embolectomy.    Past Surgical History:  Procedure Laterality Date  . distal radius fracture. (12,/16/2010)    . EMBOLECTOMY Right 02/15/2017   Procedure: EMBOLECTOMY RIGHT LEG;  Surgeon: Elam Dutch, MD;  Location: [facility];  Service: Vascular;  Laterality: Right;  . I&D EXTREMITY Right 03/05/2017   Procedure: IRRIGATION AND DEBRIDEMENT RIGHT LEG HEMATOMA EVACUATION;  Surgeon: Elam Dutch, MD;  Location: [facility];  Service: Vascular;  Laterality:  Right;  . IVC Placement 08/30/2005    . Open reduction and internal fixation of left intra-articular      Family History  Problem Relation Age of Onset  . Coronary artery disease Unknown   . Diabetes Unknown       Social History   Socioeconomic History  . Marital status: Single    Spouse name: None  . Number of children: None  . Years of education: None  . Highest education level: None  Social Needs  . Financial resource strain: None  . Food insecurity - worry: None  . Food insecurity - inability: None  . Transportation needs - medical: None  . Transportation needs - non-medical: None  Occupational History  . None  Tobacco Use  . Smoking status: Never Smoker  . Smokeless tobacco: Never Used  Substance and Sexual Activity  . Alcohol use: No  . Drug use: No  . Sexual activity: Not Currently  Other Topics Concern  . None  Social History Narrative    Lives alone.  She works at a [employer].   Daughter in area to assist if needed.  One-level home, 5-6 steps to   entry.  Functional history prior to admission was independent,   functional status upon admission to rehab services was +2, total assist   bed mobility,+2 total assist transfers, ambulation not tested,  moderate   assist upper body, total assist lower body, activities of daily living.       Allergies  Allergen Reactions  . Heparin Hives    Patient attests severe allergy- rash all over, severe itching, unable to take heparin  . Other Itching and Rash    "Sheets and Linens used by Zacarias Pontes"     Current Outpatient Medications:  .  acetaminophen (TYLENOL) 500 MG tablet, Take 1,000 mg by mouth every 6 (six) hours as needed for mild pain., Disp: , Rfl:  .  aspirin 81 MG tablet, Take 81 mg by mouth daily.  , Disp: , Rfl:  .  CARTIA XT 300 MG 24 hr capsule, TAKE 1 CAPSULE BY MOUTH ONCE DAILY (Patient taking differently: TAKE 300 mg CAPSULE BY MOUTH ONCE DAILY), Disp: 90 capsule, Rfl: 0 .   cholecalciferol (VITAMIN D) 1000 units tablet, Take 4,000 Units by mouth daily., Disp: , Rfl:  .  FLUoxetine (PROZAC) 20 MG capsule, Take 20 mg by mouth daily., Disp: , Rfl:  .  furosemide (LASIX) 40 MG tablet, Take 1 tablet (40 mg total) by mouth daily., Disp: 90 tablet, Rfl: 3 .  metoprolol tartrate (LOPRESSOR) 25 MG tablet, Take 0.5 tablets (12.5 mg total) by mouth 2 (two) times daily. KEEP OV., Disp: 90 tablet, Rfl: 0 .  potassium chloride (K-DUR) 10 MEQ tablet, Take 1 tablet (10 mEq total) by mouth daily., Disp: 90 tablet, Rfl: 3 .  warfarin (COUMADIN) 3 MG tablet, TAKE 1&1/2 TO 2 TABLETS BY MOUTH ONCE DAILY AS DIRECTED(NEEDS INR AND MD APPT FOR FURTHER REFILLS) (Patient taking differently: Take 3 mg by mouth See admin instructions. Take 6 mg On Tuesday , Thursday, Saturday and Sunday Take 4.5 mg on Monday Wednesday and Friday), Disp: 70 tablet, Rfl: 1     Review of Systems  Constitutional: Negative for chills and fever.  HENT: Negative for congestion and sore throat.   Eyes: Negative for photophobia.  Respiratory: Negative for cough, shortness of breath and wheezing.   Cardiovascular: Negative for chest pain, palpitations and leg swelling.  Gastrointestinal: Negative for abdominal pain, blood in stool, constipation, diarrhea, nausea and vomiting.  Genitourinary: Negative for dysuria, flank pain and hematuria.  Musculoskeletal: Negative for back pain and myalgias.  Skin: Positive for wound. Negative for rash.  Neurological: Negative for dizziness, weakness and headaches.  Hematological: Does not bruise/bleed easily.  Psychiatric/Behavioral: Negative for suicidal ideas.       Objective:   Physical Exam  Constitutional: She is oriented to person, place, and time. She appears well-developed and well-nourished. No distress.  HENT:  Head: Normocephalic and atraumatic.  Mouth/Throat: No oropharyngeal exudate.  Eyes: Conjunctivae and EOM are normal. No scleral icterus.  Neck: Normal  range of motion. Neck supple.  Cardiovascular: Normal rate and regular rhythm.  Pulmonary/Chest: Effort normal. No respiratory distress. She has no wheezes.  Abdominal: She exhibits no distension.  Musculoskeletal: She exhibits no edema or tenderness.  Neurological: She is alert and oriented to person, place, and time. She exhibits normal muscle tone. Coordination normal.  Skin: Skin is warm and dry. No rash noted. She is not diaphoretic. No pallor.  Psychiatric: She has a normal mood and affect. Her behavior is normal. Judgment and thought content normal.   WOund vaccuum site 04/17/17:           Assessment & Plan:   Streptococcal bacteremia: likely source was her wound. Will check surveillance blood cxs x 2 sites  MRSA infection: resolved  Wound: to see [facility] on THursday

## 2017-04-18 DIAGNOSIS — L02415 Cutaneous abscess of right lower limb: Secondary | ICD-10-CM | POA: Diagnosis not present

## 2017-04-18 DIAGNOSIS — T8141XD Infection following a procedure, superficial incisional surgical site, subsequent encounter: Secondary | ICD-10-CM | POA: Diagnosis not present

## 2017-04-18 DIAGNOSIS — I4891 Unspecified atrial fibrillation: Secondary | ICD-10-CM | POA: Diagnosis not present

## 2017-04-18 DIAGNOSIS — L03115 Cellulitis of right lower limb: Secondary | ICD-10-CM | POA: Diagnosis not present

## 2017-04-18 DIAGNOSIS — I110 Hypertensive heart disease with heart failure: Secondary | ICD-10-CM | POA: Diagnosis not present

## 2017-04-18 DIAGNOSIS — I5030 Unspecified diastolic (congestive) heart failure: Secondary | ICD-10-CM | POA: Diagnosis not present

## 2017-04-20 ENCOUNTER — Encounter: Payer: Self-pay | Admitting: Vascular Surgery

## 2017-04-20 ENCOUNTER — Ambulatory Visit (INDEPENDENT_AMBULATORY_CARE_PROVIDER_SITE_OTHER): Payer: Medicare Other | Admitting: Vascular Surgery

## 2017-04-20 VITALS — BP 140/92 | HR 77 | Temp 97.2°F | Resp 18 | Ht 66.0 in | Wt 265.8 lb

## 2017-04-20 DIAGNOSIS — I739 Peripheral vascular disease, unspecified: Secondary | ICD-10-CM

## 2017-04-20 NOTE — Progress Notes (Signed)
Patient is a 72 year old female who returns today for follow-up.  She underwent a right popliteal embolectomy February 16, 2017.  She returned postoperatively with an abscess in her below-knee popliteal space.  This grew out MORSA.  She currently is no longer on antibiotics.  She has had a VAC on the wound.  She denies any problems with numbness tingling or aching in her foot.  She has no fever or chills or significant drainage.  Physical exam:  Vitals:   04/20/17 0905 04/20/17 0906  BP: (!) 144/96 (!) 140/92  Pulse: 77   Resp: 18   Temp: (!) 97.2 F (36.2 C)   TempSrc: Oral   SpO2: 96%   Weight: 265 lb 12.8 oz (120.6 kg)   Height: 5\' 6"  (1.676 m)         Right foot pink and warm.  100% granulation tissue right leg.  Wound is now 7 cm in length 1.5 cm in width less than 3 mm depth no purulent drainage  Assessment: Continuing to heal right below-knee incision status post popliteal embolectomy.  The patient is continuing her Coumadin and states her last INR was around 3.  Adequate perfusion right foot.  Plan: Discontinue VAC today.  Hydrogel dressings once daily.  The patient will follow up with me in 1 month to make sure that the wound has completely healed.  Ruta Hinds, MD [facility] Office: [phone] Pager: [phone]

## 2017-04-21 ENCOUNTER — Ambulatory Visit (INDEPENDENT_AMBULATORY_CARE_PROVIDER_SITE_OTHER): Payer: Medicare Other | Admitting: Cardiology

## 2017-04-21 DIAGNOSIS — I743 Embolism and thrombosis of arteries of the lower extremities: Secondary | ICD-10-CM | POA: Diagnosis not present

## 2017-04-21 DIAGNOSIS — I110 Hypertensive heart disease with heart failure: Secondary | ICD-10-CM | POA: Diagnosis not present

## 2017-04-21 DIAGNOSIS — I5030 Unspecified diastolic (congestive) heart failure: Secondary | ICD-10-CM | POA: Diagnosis not present

## 2017-04-21 DIAGNOSIS — L03115 Cellulitis of right lower limb: Secondary | ICD-10-CM | POA: Diagnosis not present

## 2017-04-21 DIAGNOSIS — I4891 Unspecified atrial fibrillation: Secondary | ICD-10-CM | POA: Diagnosis not present

## 2017-04-21 DIAGNOSIS — Z5181 Encounter for therapeutic drug level monitoring: Secondary | ICD-10-CM

## 2017-04-21 DIAGNOSIS — L02415 Cutaneous abscess of right lower limb: Secondary | ICD-10-CM | POA: Diagnosis not present

## 2017-04-21 DIAGNOSIS — T8141XD Infection following a procedure, superficial incisional surgical site, subsequent encounter: Secondary | ICD-10-CM | POA: Diagnosis not present

## 2017-04-21 DIAGNOSIS — I4821 Permanent atrial fibrillation: Secondary | ICD-10-CM

## 2017-04-21 DIAGNOSIS — Z86711 Personal history of pulmonary embolism: Secondary | ICD-10-CM

## 2017-04-21 LAB — POCT INR: INR: 2.6

## 2017-04-21 NOTE — Patient Instructions (Signed)
Description   Spoke with Donita, [facility] nurse gave orders to 1.5 tablets everyday.  Recheck in one week.

## 2017-04-23 LAB — CULTURE, BLOOD (SINGLE)
MICRO NUMBER: 90085412
MICRO NUMBER:: 90085418
RESULT: NO GROWTH
Result:: NO GROWTH
SPECIMEN QUALITY: ADEQUATE
SPECIMEN QUALITY:: ADEQUATE

## 2017-04-24 DIAGNOSIS — I110 Hypertensive heart disease with heart failure: Secondary | ICD-10-CM | POA: Diagnosis not present

## 2017-04-24 DIAGNOSIS — L02415 Cutaneous abscess of right lower limb: Secondary | ICD-10-CM | POA: Diagnosis not present

## 2017-04-24 DIAGNOSIS — L03115 Cellulitis of right lower limb: Secondary | ICD-10-CM | POA: Diagnosis not present

## 2017-04-24 DIAGNOSIS — T8141XD Infection following a procedure, superficial incisional surgical site, subsequent encounter: Secondary | ICD-10-CM | POA: Diagnosis not present

## 2017-04-24 DIAGNOSIS — I4891 Unspecified atrial fibrillation: Secondary | ICD-10-CM | POA: Diagnosis not present

## 2017-04-24 DIAGNOSIS — I5030 Unspecified diastolic (congestive) heart failure: Secondary | ICD-10-CM | POA: Diagnosis not present

## 2017-04-28 ENCOUNTER — Ambulatory Visit (INDEPENDENT_AMBULATORY_CARE_PROVIDER_SITE_OTHER): Payer: Medicare Other | Admitting: Cardiology

## 2017-04-28 DIAGNOSIS — Z5181 Encounter for therapeutic drug level monitoring: Secondary | ICD-10-CM | POA: Diagnosis not present

## 2017-04-28 DIAGNOSIS — I110 Hypertensive heart disease with heart failure: Secondary | ICD-10-CM | POA: Diagnosis not present

## 2017-04-28 DIAGNOSIS — I482 Chronic atrial fibrillation: Secondary | ICD-10-CM

## 2017-04-28 DIAGNOSIS — Z86711 Personal history of pulmonary embolism: Secondary | ICD-10-CM | POA: Diagnosis not present

## 2017-04-28 DIAGNOSIS — L02415 Cutaneous abscess of right lower limb: Secondary | ICD-10-CM | POA: Diagnosis not present

## 2017-04-28 DIAGNOSIS — I743 Embolism and thrombosis of arteries of the lower extremities: Secondary | ICD-10-CM

## 2017-04-28 DIAGNOSIS — I5030 Unspecified diastolic (congestive) heart failure: Secondary | ICD-10-CM | POA: Diagnosis not present

## 2017-04-28 DIAGNOSIS — T8141XD Infection following a procedure, superficial incisional surgical site, subsequent encounter: Secondary | ICD-10-CM | POA: Diagnosis not present

## 2017-04-28 DIAGNOSIS — L03115 Cellulitis of right lower limb: Secondary | ICD-10-CM | POA: Diagnosis not present

## 2017-04-28 DIAGNOSIS — I4821 Permanent atrial fibrillation: Secondary | ICD-10-CM

## 2017-04-28 DIAGNOSIS — I4891 Unspecified atrial fibrillation: Secondary | ICD-10-CM | POA: Diagnosis not present

## 2017-04-28 LAB — POCT INR: INR: 3.2

## 2017-05-02 DIAGNOSIS — L02415 Cutaneous abscess of right lower limb: Secondary | ICD-10-CM | POA: Diagnosis not present

## 2017-05-02 DIAGNOSIS — L03115 Cellulitis of right lower limb: Secondary | ICD-10-CM | POA: Diagnosis not present

## 2017-05-02 DIAGNOSIS — I110 Hypertensive heart disease with heart failure: Secondary | ICD-10-CM | POA: Diagnosis not present

## 2017-05-02 DIAGNOSIS — I4891 Unspecified atrial fibrillation: Secondary | ICD-10-CM | POA: Diagnosis not present

## 2017-05-02 DIAGNOSIS — T8141XD Infection following a procedure, superficial incisional surgical site, subsequent encounter: Secondary | ICD-10-CM | POA: Diagnosis not present

## 2017-05-02 DIAGNOSIS — I5030 Unspecified diastolic (congestive) heart failure: Secondary | ICD-10-CM | POA: Diagnosis not present

## 2017-05-06 ENCOUNTER — Other Ambulatory Visit: Payer: Self-pay | Admitting: Cardiology

## 2017-05-08 NOTE — Telephone Encounter (Signed)
REFILL 

## 2017-05-21 ENCOUNTER — Other Ambulatory Visit: Payer: Self-pay | Admitting: Cardiology

## 2017-05-22 ENCOUNTER — Ambulatory Visit (INDEPENDENT_AMBULATORY_CARE_PROVIDER_SITE_OTHER): Payer: Medicare Other | Admitting: Pharmacist

## 2017-05-22 DIAGNOSIS — I4821 Permanent atrial fibrillation: Secondary | ICD-10-CM

## 2017-05-22 DIAGNOSIS — I2699 Other pulmonary embolism without acute cor pulmonale: Secondary | ICD-10-CM | POA: Diagnosis not present

## 2017-05-22 DIAGNOSIS — Z5181 Encounter for therapeutic drug level monitoring: Secondary | ICD-10-CM

## 2017-05-22 DIAGNOSIS — I743 Embolism and thrombosis of arteries of the lower extremities: Secondary | ICD-10-CM

## 2017-05-22 DIAGNOSIS — I482 Chronic atrial fibrillation: Secondary | ICD-10-CM | POA: Diagnosis not present

## 2017-05-22 DIAGNOSIS — Z7901 Long term (current) use of anticoagulants: Secondary | ICD-10-CM | POA: Diagnosis not present

## 2017-05-22 DIAGNOSIS — Z86711 Personal history of pulmonary embolism: Secondary | ICD-10-CM

## 2017-05-22 DIAGNOSIS — I4891 Unspecified atrial fibrillation: Secondary | ICD-10-CM

## 2017-05-22 LAB — POCT INR: INR: 2.1

## 2017-05-22 NOTE — Patient Instructions (Signed)
Take 2 tablets (6 mg)  today, and tomorrow (6 mg), then continue normal schedule of 1.5 tablets everyday.  Recheck in 3 weeks in clinic.

## 2017-05-22 NOTE — Telephone Encounter (Signed)
REFILL 

## 2017-05-24 DIAGNOSIS — F322 Major depressive disorder, single episode, severe without psychotic features: Secondary | ICD-10-CM | POA: Diagnosis not present

## 2017-05-24 DIAGNOSIS — R3915 Urgency of urination: Secondary | ICD-10-CM | POA: Diagnosis not present

## 2017-05-24 DIAGNOSIS — I10 Essential (primary) hypertension: Secondary | ICD-10-CM | POA: Diagnosis not present

## 2017-05-24 DIAGNOSIS — I63511 Cerebral infarction due to unspecified occlusion or stenosis of right middle cerebral artery: Secondary | ICD-10-CM | POA: Diagnosis not present

## 2017-05-24 DIAGNOSIS — D682 Hereditary deficiency of other clotting factors: Secondary | ICD-10-CM | POA: Diagnosis not present

## 2017-05-24 DIAGNOSIS — E559 Vitamin D deficiency, unspecified: Secondary | ICD-10-CM | POA: Diagnosis not present

## 2017-05-24 DIAGNOSIS — I70209 Unspecified atherosclerosis of native arteries of extremities, unspecified extremity: Secondary | ICD-10-CM | POA: Diagnosis not present

## 2017-05-24 DIAGNOSIS — I482 Chronic atrial fibrillation: Secondary | ICD-10-CM | POA: Diagnosis not present

## 2017-05-24 DIAGNOSIS — N3941 Urge incontinence: Secondary | ICD-10-CM | POA: Diagnosis not present

## 2017-05-24 DIAGNOSIS — L02419 Cutaneous abscess of limb, unspecified: Secondary | ICD-10-CM | POA: Diagnosis not present

## 2017-05-24 DIAGNOSIS — Z79899 Other long term (current) drug therapy: Secondary | ICD-10-CM | POA: Diagnosis not present

## 2017-05-24 DIAGNOSIS — Z23 Encounter for immunization: Secondary | ICD-10-CM | POA: Diagnosis not present

## 2017-05-25 ENCOUNTER — Ambulatory Visit (INDEPENDENT_AMBULATORY_CARE_PROVIDER_SITE_OTHER): Payer: Medicare Other | Admitting: Vascular Surgery

## 2017-05-25 ENCOUNTER — Other Ambulatory Visit: Payer: Self-pay

## 2017-05-25 ENCOUNTER — Encounter: Payer: Self-pay | Admitting: Vascular Surgery

## 2017-05-25 VITALS — BP 124/82 | HR 69 | Temp 96.6°F | Resp 16 | Ht 66.0 in | Wt 267.0 lb

## 2017-05-25 DIAGNOSIS — I739 Peripheral vascular disease, unspecified: Secondary | ICD-10-CM

## 2017-05-25 NOTE — Progress Notes (Signed)
Patient is a 72 year old female who returns for follow-up today.  She underwent a right popliteal embolectomy February 16, 2017.  She developed a postop abscess in her below-knee popliteal space.  She returns today for an additional wound check.  She denies any pain in her right foot.  Vitals:   05/25/17 0928  BP: 124/82  Pulse: 69  Resp: 16  Temp: (!) 96.6 F (35.9 C)  TempSrc: Oral  SpO2: 98%  Weight: 267 lb (121.1 kg)  Height: 5\' 6"  (1.676 m)    Right below-knee popliteal incision essentially healed now less than 1 mm in depth 1-1/2 cm in length right foot pink warm  Assessment: Doing well status post right below-knee popliteal embolectomy wound essentially healed at this point  Plan: The patient will follow-up in 1 year with repeat ABIs and see our nurse practitioner.  She will continue her anticoagulation.  Ruta Hinds, MD [facility] [place] Office: [phone] Pager: [phone]

## 2017-06-08 ENCOUNTER — Telehealth: Payer: Self-pay | Admitting: Cardiology

## 2017-06-08 NOTE — Telephone Encounter (Signed)
Closed Encounter  °

## 2017-06-12 ENCOUNTER — Ambulatory Visit (INDEPENDENT_AMBULATORY_CARE_PROVIDER_SITE_OTHER): Payer: Medicare Other | Admitting: *Deleted

## 2017-06-12 DIAGNOSIS — Z8673 Personal history of transient ischemic attack (TIA), and cerebral infarction without residual deficits: Secondary | ICD-10-CM

## 2017-06-12 DIAGNOSIS — I482 Chronic atrial fibrillation: Secondary | ICD-10-CM | POA: Diagnosis not present

## 2017-06-12 DIAGNOSIS — I4821 Permanent atrial fibrillation: Secondary | ICD-10-CM

## 2017-06-12 DIAGNOSIS — I743 Embolism and thrombosis of arteries of the lower extremities: Secondary | ICD-10-CM

## 2017-06-12 DIAGNOSIS — Z86711 Personal history of pulmonary embolism: Secondary | ICD-10-CM | POA: Diagnosis not present

## 2017-06-12 DIAGNOSIS — Z5181 Encounter for therapeutic drug level monitoring: Secondary | ICD-10-CM | POA: Diagnosis not present

## 2017-06-12 LAB — POCT INR: INR: 3

## 2017-06-12 NOTE — Patient Instructions (Signed)
Description   Continue normal schedule of 1.5 tablets of coumadin  everyday.  Recheck in 4 weeks in clinic.

## 2017-06-16 ENCOUNTER — Encounter: Payer: Self-pay | Admitting: Cardiology

## 2017-06-25 NOTE — Progress Notes (Signed)
Cardiology Office Note   Date:  06/27/2017   ID:  [name], DOB [birth year], MRN [id number]  PCP:  Josetta Huddle, MD  Cardiologist:   No primary care provider on file.   Chief Complaint  Patient presents with  . Atrial Fibrillation      History of Present Illness: [name] is a 72 y.o. female who presents for follow up of atrial fib.  She has a history of lupus anticoagulant.  She has had DVTs and bilateral PEs.  She has IVC filter bilat femoral artery emboli s/p bilat embolectomies in 0/7622, and embolic CVA in 08/3333 requiring TPA with subsequent hemorrhagic conversion, and most recently, RLE popliteal embolism (in setting of subtherapeutic INR of 1.43) requiring surgery 02/16/17. She developed a wound infection requiring surgery 03/05/17 and wound vac. She has an EF to be 40-45% which is down from her last echo.   Since she was last seen she done very well.  She is going back to work part-time.  She gets around with a cane.  She denies any new cardiovascular symptoms.  She does not notice her atrial fibrillation.  Is not having any new shortness of breath, PND or orthopnea.  She is gained a few pounds and she is trying to lose some weight.  She stopped drinking Pepsi.  She denies any new shortness of breath, PND or orthopnea.  She said no palpitations, presyncope or syncope.   Interestingly mild to moderate AS seen in the past was not noted on this echo. Her INR is now followed in our Coumadin clinic.     Past Medical History:  Diagnosis Date  . (HFpEF) heart failure with preserved ejection fraction ([place])    a. 06/2008 Echo: EF 55-60%, mild LVH, triv AI, mild to mod MS, mod to sev dil LA, mildly dil RA.  Marland Kitchen Acute right MCA stroke ([place])    a. 07/01/6254 Embolic stroke treated with TPA and thrombectomy with hemorrhagic transformation; Carotids 3/12 negative for ICA stenosis.  . Bilateral Pulmonary Emboli    a. 08/2005 - s/p IVC filter.  . Depression   . Embolus of  femoral artery ([place])    a. 06/2008 s/p bilateral embolectomies.  . History of DVT (deep vein thrombosis)    a. s/p IVC filter.  Marland Kitchen HIT (heparin-induced thrombocytopenia) ([place])   . HTN (hypertension)   . Lupus anticoagulant disorder ([place])   . Obesity, unspecified   . Permanent atrial fibrillation ([place])    a. On chronic Coumadin - INRs followed by PCP; b. CHA2DS2VASc = 5.  . Popliteal artery embolism, right ([place])    a. 01/2017 in the setting of subRx INR-->s/p embolectomy.    Past Surgical History:  Procedure Laterality Date  . distal radius fracture. (12,/16/2010)    . EMBOLECTOMY Right 02/15/2017   Procedure: EMBOLECTOMY RIGHT LEG;  Surgeon: Elam Dutch, MD;  Location: [facility];  Service: Vascular;  Laterality: Right;  . I&D EXTREMITY Right 03/05/2017   Procedure: IRRIGATION AND DEBRIDEMENT RIGHT LEG HEMATOMA EVACUATION;  Surgeon: Elam Dutch, MD;  Location: [facility];  Service: Vascular;  Laterality: Right;  . IVC Placement 08/30/2005    . Open reduction and internal fixation of left intra-articular       Current Outpatient Medications  Medication Sig Dispense Refill  . acetaminophen (TYLENOL) 500 MG tablet Take 1,000 mg by mouth every 6 (six) hours as needed for mild pain.    Marland Kitchen aspirin 81 MG tablet Take 81 mg  by mouth daily.      Marland Kitchen CARTIA XT 300 MG 24 hr capsule TAKE 1 CAPSULE BY MOUTH ONCE DAILY 90 capsule 1  . cholecalciferol (VITAMIN D) 1000 units tablet Take 4,000 Units by mouth daily.    Marland Kitchen FLUoxetine (PROZAC) 20 MG capsule Take 20 mg by mouth daily.    . furosemide (LASIX) 40 MG tablet Take 1 tablet (40 mg total) by mouth daily. 90 tablet 3  . metoprolol tartrate (LOPRESSOR) 25 MG tablet TAKE 1/2 (ONE-HALF) TABLET BY MOUTH TWICE DAILY 90 tablet 0  . potassium chloride (K-DUR) 10 MEQ tablet Take 1 tablet (10 mEq total) by mouth daily. 90 tablet 3  . warfarin (COUMADIN) 3 MG tablet TAKE 1&1/2 TO 2 TABLETS BY MOUTH ONCE DAILY AS DIRECTED(NEEDS INR AND MD APPT FOR FURTHER  REFILLS) (Patient taking differently: Take 3 mg by mouth See admin instructions. Take 6 mg On Tuesday , Thursday, Saturday and Sunday Take 4.5 mg on Monday Wednesday and Friday) 70 tablet 1   No current facility-administered medications for this visit.     Allergies:   Heparin and Other    ROS:  Please see the history of present illness.   Otherwise, review of systems are positive for none.   All other systems are reviewed and negative.    PHYSICAL EXAM: VS:  BP (!) 152/82   Pulse 72   Ht 5\' 6"  (1.676 m)   Wt 274 lb (124.3 kg)   SpO2 97%   BMI 44.22 kg/m  , BMI Body mass index is 44.22 kg/m. GENERAL:  Well appearing NECK:  No jugular venous distention, waveform within normal limits, carotid upstroke brisk and symmetric, no bruits, no thyromegaly LUNGS:  Clear to auscultation bilaterally CHEST:  Unremarkable HEART:  PMI not displaced or sustained,S1 and S2 within normal limits, no S3, no clicks, no rubs, no murmurs, irregular ABD:  Flat, positive bowel sounds normal in frequency in pitch, no bruits, no rebound, no guarding, no midline pulsatile mass, no hepatomegaly, no splenomegaly, obese EXT:  2 plus pulses throughout, no edema, no cyanosis no clubbing, well healed leg wound    EKG:  EKG is not ordered today.    Recent Labs: 03/23/2017: ALT 15 03/28/2017: BUN 9; Creatinine, Ser 1.21; Hemoglobin 11.6; Platelets 159; Potassium 3.7; Sodium 138    Lipid Panel   Wt Readings from Last 3 Encounters:  06/27/17 274 lb (124.3 kg)  05/25/17 267 lb (121.1 kg)  04/20/17 265 lb 12.8 oz (120.6 kg)      Other studies Reviewed: Additional studies/ records that were reviewed today include: Hospital records. Review of the above records demonstrates:  Please see elsewhere in the note.     ASSESSMENT AND PLAN:  Cellulitis of right lower extremity This is completely healed.  No change in therapy is planned.    HTN Her blood pressure is elevated but this is only noted in the  doctor's office.  She reports it is good at home.  No change in therapy.  NICM (nonischemic cardiomyopathy) ([place]) Presumed NICM. EF 40-45% with massive LAE, (no AS seen) Dec 2018.  I will follow this with echos in the future.    Permanent atrial fibrillation ([place]) She tolerates this.  She is very compliant with anticoagulation now.  History of lupus anticoagulant disorder Again she remains complaint with warfarin.   Long term (current) use of anticoagulants She is not to have her INR below 2 and she is aware of this.   Obesity We  talked about specific weight loss strategies.      Current medicines are reviewed at length with the patient today.  The patient does not have concerns regarding medicines.  The following changes have been made:  no change  Labs/ tests ordered today include: None No orders of the defined types were placed in this encounter.    Disposition:   FU with Kerin Ransom PAC in 4 months.     Signed, Minus Breeding, MD  06/27/2017 9:42 AM    [facility]

## 2017-06-27 ENCOUNTER — Ambulatory Visit (INDEPENDENT_AMBULATORY_CARE_PROVIDER_SITE_OTHER): Payer: Medicare Other | Admitting: Cardiology

## 2017-06-27 ENCOUNTER — Encounter: Payer: Self-pay | Admitting: Cardiology

## 2017-06-27 VITALS — BP 152/82 | HR 72 | Ht 66.0 in | Wt 274.0 lb

## 2017-06-27 DIAGNOSIS — I743 Embolism and thrombosis of arteries of the lower extremities: Secondary | ICD-10-CM

## 2017-06-27 DIAGNOSIS — I482 Chronic atrial fibrillation: Secondary | ICD-10-CM | POA: Diagnosis not present

## 2017-06-27 DIAGNOSIS — I10 Essential (primary) hypertension: Secondary | ICD-10-CM | POA: Diagnosis not present

## 2017-06-27 DIAGNOSIS — Z86711 Personal history of pulmonary embolism: Secondary | ICD-10-CM | POA: Diagnosis not present

## 2017-06-27 DIAGNOSIS — I4821 Permanent atrial fibrillation: Secondary | ICD-10-CM

## 2017-06-27 DIAGNOSIS — Z862 Personal history of diseases of the blood and blood-forming organs and certain disorders involving the immune mechanism: Secondary | ICD-10-CM

## 2017-06-27 DIAGNOSIS — I428 Other cardiomyopathies: Secondary | ICD-10-CM | POA: Diagnosis not present

## 2017-06-27 NOTE — Patient Instructions (Signed)
Medication Instructions:  Continue current medications  If you need a refill on your cardiac medications before your next appointment, please call your pharmacy.  Labwork: None Ordered  Testing/Procedures: None Ordered  Follow-Up: Your physician wants you to follow-up in: 4 Months.    Thank you for choosing [facility]!!      

## 2017-06-29 ENCOUNTER — Ambulatory Visit: Payer: Medicare Other | Admitting: Cardiology

## 2017-07-10 ENCOUNTER — Ambulatory Visit (INDEPENDENT_AMBULATORY_CARE_PROVIDER_SITE_OTHER): Payer: Medicare Other | Admitting: *Deleted

## 2017-07-10 DIAGNOSIS — Z5181 Encounter for therapeutic drug level monitoring: Secondary | ICD-10-CM

## 2017-07-10 DIAGNOSIS — I743 Embolism and thrombosis of arteries of the lower extremities: Secondary | ICD-10-CM | POA: Diagnosis not present

## 2017-07-10 DIAGNOSIS — I482 Chronic atrial fibrillation: Secondary | ICD-10-CM

## 2017-07-10 DIAGNOSIS — I4821 Permanent atrial fibrillation: Secondary | ICD-10-CM

## 2017-07-10 DIAGNOSIS — Z86711 Personal history of pulmonary embolism: Secondary | ICD-10-CM | POA: Diagnosis not present

## 2017-07-10 LAB — POCT INR: INR: 2

## 2017-07-10 NOTE — Patient Instructions (Signed)
Description   Today take 2 tablets then continue normal schedule of 1.5 tablets of coumadin  everyday.  Recheck in 3 weeks.

## 2017-07-13 ENCOUNTER — Other Ambulatory Visit: Payer: Self-pay | Admitting: *Deleted

## 2017-07-13 MED ORDER — WARFARIN SODIUM 3 MG PO TABS: ORAL_TABLET | ORAL | 1 refills | Status: DC

## 2017-07-22 ENCOUNTER — Other Ambulatory Visit: Payer: Self-pay | Admitting: Cardiology

## 2017-07-24 NOTE — Telephone Encounter (Signed)
REFILL 

## 2017-07-31 ENCOUNTER — Ambulatory Visit (INDEPENDENT_AMBULATORY_CARE_PROVIDER_SITE_OTHER): Payer: Medicare Other | Admitting: *Deleted

## 2017-07-31 DIAGNOSIS — I482 Chronic atrial fibrillation: Secondary | ICD-10-CM

## 2017-07-31 DIAGNOSIS — I4821 Permanent atrial fibrillation: Secondary | ICD-10-CM

## 2017-07-31 DIAGNOSIS — I743 Embolism and thrombosis of arteries of the lower extremities: Secondary | ICD-10-CM

## 2017-07-31 DIAGNOSIS — Z86711 Personal history of pulmonary embolism: Secondary | ICD-10-CM | POA: Diagnosis not present

## 2017-07-31 DIAGNOSIS — Z5181 Encounter for therapeutic drug level monitoring: Secondary | ICD-10-CM | POA: Diagnosis not present

## 2017-07-31 LAB — POCT INR: INR: 1.9

## 2017-07-31 NOTE — Patient Instructions (Signed)
Description   Today May 6th  take 2 tablets then change coumadin dose to  1.5 tablets of Coumadin everyday. Except 2 tablets on Fridays  Recheck in 2 weeks.

## 2017-08-14 ENCOUNTER — Ambulatory Visit (INDEPENDENT_AMBULATORY_CARE_PROVIDER_SITE_OTHER): Payer: Medicare Other | Admitting: *Deleted

## 2017-08-14 DIAGNOSIS — Z5181 Encounter for therapeutic drug level monitoring: Secondary | ICD-10-CM

## 2017-08-14 DIAGNOSIS — I743 Embolism and thrombosis of arteries of the lower extremities: Secondary | ICD-10-CM | POA: Diagnosis not present

## 2017-08-14 DIAGNOSIS — I482 Chronic atrial fibrillation: Secondary | ICD-10-CM

## 2017-08-14 DIAGNOSIS — Z8673 Personal history of transient ischemic attack (TIA), and cerebral infarction without residual deficits: Secondary | ICD-10-CM

## 2017-08-14 DIAGNOSIS — I4821 Permanent atrial fibrillation: Secondary | ICD-10-CM

## 2017-08-14 DIAGNOSIS — Z86711 Personal history of pulmonary embolism: Secondary | ICD-10-CM

## 2017-08-14 LAB — POCT INR: INR: 1.6

## 2017-08-14 NOTE — Patient Instructions (Signed)
Description   Today May 20th take 2 tablets and take 2 tablets on may 21st then continue same dose of coumadin   1.5 tablets of Coumadin everyday. Except 2 tablets on Fridays  Recheck in 2 weeks.

## 2017-08-28 ENCOUNTER — Ambulatory Visit (INDEPENDENT_AMBULATORY_CARE_PROVIDER_SITE_OTHER): Payer: Medicare Other | Admitting: *Deleted

## 2017-08-28 DIAGNOSIS — Z5181 Encounter for therapeutic drug level monitoring: Secondary | ICD-10-CM | POA: Diagnosis not present

## 2017-08-28 DIAGNOSIS — Z86711 Personal history of pulmonary embolism: Secondary | ICD-10-CM | POA: Diagnosis not present

## 2017-08-28 DIAGNOSIS — I482 Chronic atrial fibrillation: Secondary | ICD-10-CM

## 2017-08-28 DIAGNOSIS — I743 Embolism and thrombosis of arteries of the lower extremities: Secondary | ICD-10-CM

## 2017-08-28 DIAGNOSIS — I4821 Permanent atrial fibrillation: Secondary | ICD-10-CM

## 2017-08-28 LAB — POCT INR: INR: 2.7 (ref 2.0–3.0)

## 2017-08-28 NOTE — Patient Instructions (Addendum)
  Description   Continue taking Coumadin 1.5 tablets of Coumadin everyday except 2 tablets on Fridays.  Recheck in 3 weeks.

## 2017-09-18 DIAGNOSIS — I63511 Cerebral infarction due to unspecified occlusion or stenosis of right middle cerebral artery: Secondary | ICD-10-CM | POA: Diagnosis not present

## 2017-09-18 DIAGNOSIS — I70209 Unspecified atherosclerosis of native arteries of extremities, unspecified extremity: Secondary | ICD-10-CM | POA: Diagnosis not present

## 2017-09-18 DIAGNOSIS — D682 Hereditary deficiency of other clotting factors: Secondary | ICD-10-CM | POA: Diagnosis not present

## 2017-09-18 DIAGNOSIS — I482 Chronic atrial fibrillation: Secondary | ICD-10-CM | POA: Diagnosis not present

## 2017-09-18 DIAGNOSIS — I10 Essential (primary) hypertension: Secondary | ICD-10-CM | POA: Diagnosis not present

## 2017-09-18 DIAGNOSIS — R3915 Urgency of urination: Secondary | ICD-10-CM | POA: Diagnosis not present

## 2017-09-18 DIAGNOSIS — F322 Major depressive disorder, single episode, severe without psychotic features: Secondary | ICD-10-CM | POA: Diagnosis not present

## 2017-09-18 DIAGNOSIS — E559 Vitamin D deficiency, unspecified: Secondary | ICD-10-CM | POA: Diagnosis not present

## 2017-09-18 DIAGNOSIS — Z79899 Other long term (current) drug therapy: Secondary | ICD-10-CM | POA: Diagnosis not present

## 2017-09-18 DIAGNOSIS — N3941 Urge incontinence: Secondary | ICD-10-CM | POA: Diagnosis not present

## 2017-09-24 ENCOUNTER — Other Ambulatory Visit: Payer: Self-pay | Admitting: Cardiology

## 2017-09-29 ENCOUNTER — Ambulatory Visit (INDEPENDENT_AMBULATORY_CARE_PROVIDER_SITE_OTHER): Payer: Medicare Other | Admitting: *Deleted

## 2017-09-29 DIAGNOSIS — I743 Embolism and thrombosis of arteries of the lower extremities: Secondary | ICD-10-CM

## 2017-09-29 DIAGNOSIS — Z86711 Personal history of pulmonary embolism: Secondary | ICD-10-CM | POA: Diagnosis not present

## 2017-09-29 DIAGNOSIS — I4821 Permanent atrial fibrillation: Secondary | ICD-10-CM

## 2017-09-29 DIAGNOSIS — Z5181 Encounter for therapeutic drug level monitoring: Secondary | ICD-10-CM

## 2017-09-29 DIAGNOSIS — I482 Chronic atrial fibrillation: Secondary | ICD-10-CM

## 2017-09-29 DIAGNOSIS — Z8673 Personal history of transient ischemic attack (TIA), and cerebral infarction without residual deficits: Secondary | ICD-10-CM

## 2017-09-29 LAB — POCT INR: INR: 2 (ref 2.0–3.0)

## 2017-09-29 NOTE — Patient Instructions (Signed)
Description   Today July 5th take 2 and 1/2 tablets then continue taking Coumadin 1 and 1/2 tablets of Coumadin everyday except 2 tablets on Fridays.  Recheck in 2 weeks.

## 2017-10-13 ENCOUNTER — Ambulatory Visit (INDEPENDENT_AMBULATORY_CARE_PROVIDER_SITE_OTHER): Payer: Medicare Other

## 2017-10-13 DIAGNOSIS — I743 Embolism and thrombosis of arteries of the lower extremities: Secondary | ICD-10-CM

## 2017-10-13 DIAGNOSIS — Z5181 Encounter for therapeutic drug level monitoring: Secondary | ICD-10-CM | POA: Diagnosis not present

## 2017-10-13 DIAGNOSIS — Z86711 Personal history of pulmonary embolism: Secondary | ICD-10-CM

## 2017-10-13 DIAGNOSIS — I4821 Permanent atrial fibrillation: Secondary | ICD-10-CM

## 2017-10-13 DIAGNOSIS — I482 Chronic atrial fibrillation: Secondary | ICD-10-CM | POA: Diagnosis not present

## 2017-10-13 LAB — POCT INR: INR: 1.8 — AB (ref 2.0–3.0)

## 2017-10-13 NOTE — Patient Instructions (Signed)
Description   Take 2.5 tablets today, then start taking 1.5 tablets of Coumadin everyday except 2 tablets on Mondays, Wednesdays, and Fridays.  Recheck in 2 weeks.

## 2017-10-27 ENCOUNTER — Ambulatory Visit (INDEPENDENT_AMBULATORY_CARE_PROVIDER_SITE_OTHER): Payer: Medicare Other | Admitting: *Deleted

## 2017-10-27 DIAGNOSIS — Z5181 Encounter for therapeutic drug level monitoring: Secondary | ICD-10-CM

## 2017-10-27 DIAGNOSIS — I482 Chronic atrial fibrillation: Secondary | ICD-10-CM

## 2017-10-27 DIAGNOSIS — I743 Embolism and thrombosis of arteries of the lower extremities: Secondary | ICD-10-CM | POA: Diagnosis not present

## 2017-10-27 DIAGNOSIS — I4821 Permanent atrial fibrillation: Secondary | ICD-10-CM

## 2017-10-27 DIAGNOSIS — Z86711 Personal history of pulmonary embolism: Secondary | ICD-10-CM

## 2017-10-27 LAB — POCT INR: INR: 2.9 (ref 2.0–3.0)

## 2017-10-27 NOTE — Patient Instructions (Signed)
Description   Continue taking 1.5 tablets of Coumadin everyday except 2 tablets on Mondays, Wednesdays, and Fridays.  Recheck in 3 weeks.

## 2017-10-28 NOTE — Progress Notes (Signed)
Cardiology Office Note   Date:  10/30/2017   ID:  [name], DOB 1945-11-19, MRN [id number]  PCP:  Josetta Huddle, MD  Cardiologist:   No primary care provider on file.   No chief complaint on file.     History of Present Illness: [name] is a 72 y.o. female who presents for follow up of atrial fib.  She has a history of lupus anticoagulant.  She has had DVTs and bilateral PEs.  She has IVC filter bilat femoral artery emboli s/p bilat embolectomies in 0/7371, and embolic CVA in 0/6269 requiring TPA with subsequent hemorrhagic conversion, and most recently, RLE popliteal embolism (in setting of subtherapeutic INR of 1.43) requiring surgery 02/16/17. She developed a wound infection requiring surgery 03/05/17 and wound vac. She has an EF to be 40-45% which is down from her last echo.   She returns for follow up.  Since I last saw her she has done well.  The patient denies any new symptoms such as chest discomfort, neck or arm discomfort. There has been no new shortness of breath, PND or orthopnea. There have been no reported palpitations, presyncope or syncope.   She is doing a little walking and works.    Past Medical History:  Diagnosis Date  . (HFpEF) heart failure with preserved ejection fraction ([place])    a. 06/2008 Echo: EF 55-60%, mild LVH, triv AI, mild to mod MS, mod to sev dil LA, mildly dil RA.  Marland Kitchen Acute right MCA stroke ([place])    a. 07/03/5460 Embolic stroke treated with TPA and thrombectomy with hemorrhagic transformation; Carotids 3/12 negative for ICA stenosis.  . Bilateral Pulmonary Emboli    a. 08/2005 - s/p IVC filter.  . Depression   . Embolus of femoral artery ([place])    a. 06/2008 s/p bilateral embolectomies.  . History of DVT (deep vein thrombosis)    a. s/p IVC filter.  Marland Kitchen HIT (heparin-induced thrombocytopenia) ([place])   . HTN (hypertension)   . Lupus anticoagulant disorder ([place])   . Obesity, unspecified   . Permanent atrial fibrillation ([place])    a. On  chronic Coumadin - INRs followed by PCP; b. CHA2DS2VASc = 5.  . Popliteal artery embolism, right ([place])    a. 01/2017 in the setting of subRx INR-->s/p embolectomy.    Past Surgical History:  Procedure Laterality Date  . distal radius fracture. (12,/16/2010)    . EMBOLECTOMY Right 02/15/2017   Procedure: EMBOLECTOMY RIGHT LEG;  Surgeon: Elam Dutch, MD;  Location: [facility];  Service: Vascular;  Laterality: Right;  . I&D EXTREMITY Right 03/05/2017   Procedure: IRRIGATION AND DEBRIDEMENT RIGHT LEG HEMATOMA EVACUATION;  Surgeon: Elam Dutch, MD;  Location: [facility];  Service: Vascular;  Laterality: Right;  . IVC Placement 08/30/2005    . Open reduction and internal fixation of left intra-articular       Current Outpatient Medications  Medication Sig Dispense Refill  . acetaminophen (TYLENOL) 500 MG tablet Take 1,000 mg by mouth every 6 (six) hours as needed for mild pain.    Marland Kitchen aspirin 81 MG tablet Take 81 mg by mouth daily.      Marland Kitchen CARTIA XT 300 MG 24 hr capsule TAKE 1 CAPSULE BY MOUTH ONCE DAILY 90 capsule 1  . cholecalciferol (VITAMIN D) 1000 units tablet Take 4,000 Units by mouth daily.    Marland Kitchen FLUoxetine (PROZAC) 20 MG capsule Take 20 mg by mouth daily.    . furosemide (LASIX) 40 MG  tablet Take 1 tablet (40 mg total) by mouth daily. 90 tablet 3  . metoprolol tartrate (LOPRESSOR) 25 MG tablet TAKE 1/2 (ONE-HALF) TABLET BY MOUTH TWICE DAILY 90 tablet 1  . potassium chloride (K-DUR) 10 MEQ tablet Take 1 tablet (10 mEq total) by mouth daily. 90 tablet 3  . warfarin (COUMADIN) 3 MG tablet TAKE 1 & 1/2 (ONE & ONE-HALF) TO 2 TABLETS BY MOUTH ONCE DAILY OR  AS  DIRECTED  BY  COUMADIN  CLINIC 60 tablet 3   No current facility-administered medications for this visit.     Allergies:   Heparin and Other    ROS:  Please see the history of present illness.   Otherwise, review of systems are positive for joint pain. .   All other systems are reviewed and negative.    PHYSICAL EXAM: VS:  BP  137/80   Pulse 62   Ht 5\' 6"  (1.676 m)   Wt 286 lb 3.2 oz (129.8 kg)   BMI 46.19 kg/m  , BMI Body mass index is 46.19 kg/m.  PHYSICAL EXAM GEN:  No distress NECK:  No jugular venous distention at 90 degrees, waveform within normal limits, carotid upstroke brisk and symmetric, no bruits, no thyromegaly LUNGS:  Clear to auscultation bilaterally BACK:  No CVA tenderness CHEST:  Unremarkable HEART:  S1 and S2 within normal limits, no S3, no clicks, no rubs, no murmurs, irregular  ABD:  Positive bowel sounds normal in frequency in pitch, no bruits, no rebound, no guarding, unable to assess midline mass or bruit with the patient seated. EXT:  2 plus pulses throughout, moderate edema, no cyanosis no clubbing   EKG:  EKG note ordered today.    Recent Labs: 03/23/2017: ALT 15 03/28/2017: BUN 9; Creatinine, Ser 1.21; Hemoglobin 11.6; Platelets 159; Potassium 3.7; Sodium 138    Lipid Panel   Wt Readings from Last 3 Encounters:  10/30/17 286 lb 3.2 oz (129.8 kg)  06/27/17 274 lb (124.3 kg)  05/25/17 267 lb (121.1 kg)      Other studies Reviewed: Additional studies/ records that were reviewed today include: None Review of the above records demonstrates:  Please see elsewhere in the note.     ASSESSMENT AND PLAN:  Cellulitis of right lower extremity This has healed.   HTN Her blood pressure is at target.  No change in therapy.   NICM (nonischemic cardiomyopathy) ([place]) Presumed NICM. EF 40-45% with massive LAE, (no AS seen) Dec 2018. No change in therapy.   Permanent atrial fibrillation ([place]) [name] has a CHA2DS2 - VASc score of 5.  She continues with anticoagulation.   History of lupus anticoagulant disorder Continue warfarin.   Long term (current) use of anticoagulants She would need bridging if she were to ever need to be off of warfarin.   Obesity We have talked about this but she is unable to loose weight.      Current medicines are reviewed at  length with the patient today.  The patient does not have concerns regarding medicines.  The following changes have been made:  no change  Labs/ tests ordered today include: None No orders of the defined types were placed in this encounter.    Disposition:   FU with Kerin Ransom PAC in 4 months.     Signed, Minus Breeding, MD  10/30/2017 9:41 PM    [facility]

## 2017-10-30 ENCOUNTER — Ambulatory Visit (INDEPENDENT_AMBULATORY_CARE_PROVIDER_SITE_OTHER): Payer: Medicare Other | Admitting: Cardiology

## 2017-10-30 ENCOUNTER — Encounter: Payer: Self-pay | Admitting: Cardiology

## 2017-10-30 VITALS — BP 137/80 | HR 62 | Ht 66.0 in | Wt 286.2 lb

## 2017-10-30 DIAGNOSIS — I428 Other cardiomyopathies: Secondary | ICD-10-CM | POA: Diagnosis not present

## 2017-10-30 DIAGNOSIS — I482 Chronic atrial fibrillation: Secondary | ICD-10-CM

## 2017-10-30 DIAGNOSIS — I10 Essential (primary) hypertension: Secondary | ICD-10-CM

## 2017-10-30 DIAGNOSIS — I4821 Permanent atrial fibrillation: Secondary | ICD-10-CM

## 2017-10-30 DIAGNOSIS — I743 Embolism and thrombosis of arteries of the lower extremities: Secondary | ICD-10-CM

## 2017-10-30 NOTE — Patient Instructions (Signed)
Medication Instructions:  Your physician recommends that you continue on your current medications as directed. Please refer to the Current Medication list given to you today.   Labwork: none  Testing/Procedures: none  Follow-Up: Your physician wants you to follow-up in: 12 months with Dr. Hochrein.  You will receive a reminder letter in the mail two months in advance. If you don't receive a letter, please call our office to schedule the follow-up appointment.   Any Other Special Instructions Will Be Listed Below (If Applicable).     If you need a refill on your cardiac medications before your next appointment, please call your pharmacy.   

## 2017-11-21 ENCOUNTER — Ambulatory Visit (INDEPENDENT_AMBULATORY_CARE_PROVIDER_SITE_OTHER): Payer: Medicare Other | Admitting: *Deleted

## 2017-11-21 DIAGNOSIS — I743 Embolism and thrombosis of arteries of the lower extremities: Secondary | ICD-10-CM | POA: Diagnosis not present

## 2017-11-21 DIAGNOSIS — Z86711 Personal history of pulmonary embolism: Secondary | ICD-10-CM

## 2017-11-21 DIAGNOSIS — Z5181 Encounter for therapeutic drug level monitoring: Secondary | ICD-10-CM | POA: Diagnosis not present

## 2017-11-21 DIAGNOSIS — I482 Chronic atrial fibrillation: Secondary | ICD-10-CM | POA: Diagnosis not present

## 2017-11-21 DIAGNOSIS — I4821 Permanent atrial fibrillation: Secondary | ICD-10-CM

## 2017-11-21 LAB — POCT INR: INR: 2.9 (ref 2.0–3.0)

## 2017-11-21 NOTE — Patient Instructions (Signed)
Description   Continue taking 1.5 tablets of Coumadin everyday except 2 tablets on Mondays, Wednesdays, and Fridays.  Recheck in 4 weeks.

## 2017-12-01 ENCOUNTER — Other Ambulatory Visit: Payer: Self-pay | Admitting: Cardiology

## 2017-12-05 ENCOUNTER — Other Ambulatory Visit: Payer: Self-pay | Admitting: Cardiology

## 2017-12-18 ENCOUNTER — Ambulatory Visit (INDEPENDENT_AMBULATORY_CARE_PROVIDER_SITE_OTHER): Payer: Medicare Other | Admitting: Pharmacist

## 2017-12-18 DIAGNOSIS — I743 Embolism and thrombosis of arteries of the lower extremities: Secondary | ICD-10-CM

## 2017-12-18 DIAGNOSIS — Z86711 Personal history of pulmonary embolism: Secondary | ICD-10-CM

## 2017-12-18 DIAGNOSIS — Z5181 Encounter for therapeutic drug level monitoring: Secondary | ICD-10-CM | POA: Diagnosis not present

## 2017-12-18 DIAGNOSIS — I4821 Permanent atrial fibrillation: Secondary | ICD-10-CM

## 2017-12-18 DIAGNOSIS — I482 Chronic atrial fibrillation: Secondary | ICD-10-CM

## 2017-12-18 LAB — POCT INR: INR: 2.7 (ref 2.0–3.0)

## 2017-12-18 NOTE — Patient Instructions (Signed)
Description   Continue taking 1.5 tablets of Coumadin everyday except 2 tablets on Mondays, Wednesdays, and Fridays.  Recheck in 6 weeks.

## 2018-01-23 DIAGNOSIS — F322 Major depressive disorder, single episode, severe without psychotic features: Secondary | ICD-10-CM | POA: Diagnosis not present

## 2018-01-23 DIAGNOSIS — Z1389 Encounter for screening for other disorder: Secondary | ICD-10-CM | POA: Diagnosis not present

## 2018-01-23 DIAGNOSIS — Z0000 Encounter for general adult medical examination without abnormal findings: Secondary | ICD-10-CM | POA: Diagnosis not present

## 2018-01-29 ENCOUNTER — Ambulatory Visit (INDEPENDENT_AMBULATORY_CARE_PROVIDER_SITE_OTHER): Payer: Medicare Other

## 2018-01-29 DIAGNOSIS — Z5181 Encounter for therapeutic drug level monitoring: Secondary | ICD-10-CM | POA: Diagnosis not present

## 2018-01-29 DIAGNOSIS — I4821 Permanent atrial fibrillation: Secondary | ICD-10-CM

## 2018-01-29 DIAGNOSIS — Z86711 Personal history of pulmonary embolism: Secondary | ICD-10-CM

## 2018-01-29 DIAGNOSIS — I743 Embolism and thrombosis of arteries of the lower extremities: Secondary | ICD-10-CM | POA: Diagnosis not present

## 2018-01-29 LAB — POCT INR: INR: 2.9 (ref 2.0–3.0)

## 2018-01-29 NOTE — Patient Instructions (Signed)
Description   Continue on same dosage 1.5 tablets of Coumadin everyday except 2 tablets on Mondays, Wednesdays, and Fridays.  Recheck in 6 weeks.

## 2018-02-11 ENCOUNTER — Other Ambulatory Visit: Payer: Self-pay | Admitting: Cardiology

## 2018-03-20 ENCOUNTER — Ambulatory Visit (INDEPENDENT_AMBULATORY_CARE_PROVIDER_SITE_OTHER): Payer: Medicare Other | Admitting: Pharmacist

## 2018-03-20 DIAGNOSIS — Z86711 Personal history of pulmonary embolism: Secondary | ICD-10-CM

## 2018-03-20 DIAGNOSIS — I4821 Permanent atrial fibrillation: Secondary | ICD-10-CM | POA: Diagnosis not present

## 2018-03-20 DIAGNOSIS — Z5181 Encounter for therapeutic drug level monitoring: Secondary | ICD-10-CM | POA: Diagnosis not present

## 2018-03-20 DIAGNOSIS — I743 Embolism and thrombosis of arteries of the lower extremities: Secondary | ICD-10-CM

## 2018-03-20 LAB — POCT INR: INR: 4 — AB (ref 2.0–3.0)

## 2018-03-20 NOTE — Patient Instructions (Signed)
Description   Skip today's dose of warfarin then continue on same dosage 1.5 tablets of Coumadin everyday except 2 tablets on Mondays, Wednesdays, and Fridays.  Recheck in 4 weeks.

## 2018-04-23 ENCOUNTER — Other Ambulatory Visit: Payer: Self-pay | Admitting: Cardiology

## 2018-04-23 DIAGNOSIS — R609 Edema, unspecified: Secondary | ICD-10-CM

## 2018-05-07 ENCOUNTER — Ambulatory Visit (INDEPENDENT_AMBULATORY_CARE_PROVIDER_SITE_OTHER): Payer: Medicare Other | Admitting: *Deleted

## 2018-05-07 DIAGNOSIS — Z5181 Encounter for therapeutic drug level monitoring: Secondary | ICD-10-CM

## 2018-05-07 DIAGNOSIS — I4821 Permanent atrial fibrillation: Secondary | ICD-10-CM

## 2018-05-07 DIAGNOSIS — I743 Embolism and thrombosis of arteries of the lower extremities: Secondary | ICD-10-CM | POA: Diagnosis not present

## 2018-05-07 DIAGNOSIS — Z86711 Personal history of pulmonary embolism: Secondary | ICD-10-CM | POA: Diagnosis not present

## 2018-05-07 LAB — POCT INR: INR: 4.2 — AB (ref 2.0–3.0)

## 2018-05-07 NOTE — Patient Instructions (Signed)
Description   Skip today's dose of warfarin then start taking 1.5 tablets of Coumadin everyday except 2 tablets on Mondays and Fridays.  Recheck in 2 weeks.

## 2018-05-21 ENCOUNTER — Ambulatory Visit (INDEPENDENT_AMBULATORY_CARE_PROVIDER_SITE_OTHER): Payer: Medicare Other | Admitting: *Deleted

## 2018-05-21 DIAGNOSIS — I4821 Permanent atrial fibrillation: Secondary | ICD-10-CM | POA: Diagnosis not present

## 2018-05-21 DIAGNOSIS — Z79899 Other long term (current) drug therapy: Secondary | ICD-10-CM | POA: Diagnosis not present

## 2018-05-21 DIAGNOSIS — Z5181 Encounter for therapeutic drug level monitoring: Secondary | ICD-10-CM

## 2018-05-21 DIAGNOSIS — I70209 Unspecified atherosclerosis of native arteries of extremities, unspecified extremity: Secondary | ICD-10-CM | POA: Diagnosis not present

## 2018-05-21 DIAGNOSIS — F322 Major depressive disorder, single episode, severe without psychotic features: Secondary | ICD-10-CM | POA: Diagnosis not present

## 2018-05-21 DIAGNOSIS — I4891 Unspecified atrial fibrillation: Secondary | ICD-10-CM | POA: Diagnosis not present

## 2018-05-21 DIAGNOSIS — N3941 Urge incontinence: Secondary | ICD-10-CM | POA: Diagnosis not present

## 2018-05-21 DIAGNOSIS — E559 Vitamin D deficiency, unspecified: Secondary | ICD-10-CM | POA: Diagnosis not present

## 2018-05-21 DIAGNOSIS — I10 Essential (primary) hypertension: Secondary | ICD-10-CM | POA: Diagnosis not present

## 2018-05-21 DIAGNOSIS — I743 Embolism and thrombosis of arteries of the lower extremities: Secondary | ICD-10-CM | POA: Diagnosis not present

## 2018-05-21 DIAGNOSIS — R3915 Urgency of urination: Secondary | ICD-10-CM | POA: Diagnosis not present

## 2018-05-21 DIAGNOSIS — I63511 Cerebral infarction due to unspecified occlusion or stenosis of right middle cerebral artery: Secondary | ICD-10-CM | POA: Diagnosis not present

## 2018-05-21 DIAGNOSIS — Z86711 Personal history of pulmonary embolism: Secondary | ICD-10-CM

## 2018-05-21 DIAGNOSIS — D682 Hereditary deficiency of other clotting factors: Secondary | ICD-10-CM | POA: Diagnosis not present

## 2018-05-21 LAB — POCT INR: INR: 4 — AB (ref 2.0–3.0)

## 2018-05-21 NOTE — Patient Instructions (Signed)
Description   Skip today's dose of warfarin then start taking the dose you should be taking which is 1.5 tablets of Coumadin everyday except 2 tablets on Mondays and Fridays.  Recheck in 2 weeks.

## 2018-05-28 ENCOUNTER — Ambulatory Visit (INDEPENDENT_AMBULATORY_CARE_PROVIDER_SITE_OTHER): Payer: Medicare Other | Admitting: Pharmacist

## 2018-05-28 DIAGNOSIS — I4821 Permanent atrial fibrillation: Secondary | ICD-10-CM | POA: Diagnosis not present

## 2018-05-28 DIAGNOSIS — I743 Embolism and thrombosis of arteries of the lower extremities: Secondary | ICD-10-CM

## 2018-05-28 DIAGNOSIS — Z86711 Personal history of pulmonary embolism: Secondary | ICD-10-CM

## 2018-05-28 DIAGNOSIS — Z5181 Encounter for therapeutic drug level monitoring: Secondary | ICD-10-CM | POA: Diagnosis not present

## 2018-05-28 LAB — POCT INR: INR: 3 (ref 2.0–3.0)

## 2018-05-28 NOTE — Patient Instructions (Signed)
Description   Continue taking 1.5 tablets of Coumadin everyday except 2 tablets on Mondays and Fridays.  Recheck in 3 weeks.

## 2018-06-07 ENCOUNTER — Other Ambulatory Visit: Payer: Self-pay | Admitting: Cardiology

## 2018-06-11 ENCOUNTER — Other Ambulatory Visit: Payer: Self-pay | Admitting: Cardiology

## 2018-06-15 ENCOUNTER — Telehealth: Payer: Self-pay

## 2018-06-15 NOTE — Telephone Encounter (Signed)

## 2018-06-18 ENCOUNTER — Ambulatory Visit (INDEPENDENT_AMBULATORY_CARE_PROVIDER_SITE_OTHER): Payer: Medicare Other | Admitting: Pharmacist

## 2018-06-18 ENCOUNTER — Other Ambulatory Visit: Payer: Self-pay

## 2018-06-18 DIAGNOSIS — Z5181 Encounter for therapeutic drug level monitoring: Secondary | ICD-10-CM | POA: Diagnosis not present

## 2018-06-18 DIAGNOSIS — I743 Embolism and thrombosis of arteries of the lower extremities: Secondary | ICD-10-CM | POA: Diagnosis not present

## 2018-06-18 DIAGNOSIS — I4821 Permanent atrial fibrillation: Secondary | ICD-10-CM | POA: Diagnosis not present

## 2018-06-18 DIAGNOSIS — Z86711 Personal history of pulmonary embolism: Secondary | ICD-10-CM

## 2018-06-18 LAB — POCT INR: INR: 3.3 — AB (ref 2.0–3.0)

## 2018-06-18 NOTE — Patient Instructions (Signed)
Description   Continue taking 1.5 tablets of Coumadin everyday except 2 tablets on Mondays and Fridays.  Recheck in 6 weeks.

## 2018-07-27 ENCOUNTER — Telehealth: Payer: Self-pay

## 2018-07-27 NOTE — Telephone Encounter (Signed)

## 2018-07-30 ENCOUNTER — Ambulatory Visit (INDEPENDENT_AMBULATORY_CARE_PROVIDER_SITE_OTHER): Payer: Medicare Other | Admitting: Pharmacist

## 2018-07-30 ENCOUNTER — Other Ambulatory Visit: Payer: Self-pay

## 2018-07-30 DIAGNOSIS — I4821 Permanent atrial fibrillation: Secondary | ICD-10-CM

## 2018-07-30 DIAGNOSIS — Z86711 Personal history of pulmonary embolism: Secondary | ICD-10-CM

## 2018-07-30 DIAGNOSIS — I743 Embolism and thrombosis of arteries of the lower extremities: Secondary | ICD-10-CM

## 2018-07-30 DIAGNOSIS — Z5181 Encounter for therapeutic drug level monitoring: Secondary | ICD-10-CM

## 2018-07-30 LAB — POCT INR: INR: 1.6 — AB (ref 2.0–3.0)

## 2018-08-09 ENCOUNTER — Telehealth: Payer: Self-pay

## 2018-08-09 NOTE — Telephone Encounter (Signed)

## 2018-08-10 ENCOUNTER — Other Ambulatory Visit: Payer: Self-pay

## 2018-08-10 ENCOUNTER — Ambulatory Visit (INDEPENDENT_AMBULATORY_CARE_PROVIDER_SITE_OTHER): Payer: Medicare Other | Admitting: *Deleted

## 2018-08-10 DIAGNOSIS — I743 Embolism and thrombosis of arteries of the lower extremities: Secondary | ICD-10-CM

## 2018-08-10 DIAGNOSIS — Z86711 Personal history of pulmonary embolism: Secondary | ICD-10-CM | POA: Diagnosis not present

## 2018-08-10 DIAGNOSIS — I4821 Permanent atrial fibrillation: Secondary | ICD-10-CM

## 2018-08-10 DIAGNOSIS — Z5181 Encounter for therapeutic drug level monitoring: Secondary | ICD-10-CM

## 2018-08-10 LAB — POCT INR: INR: 1.9 — AB (ref 2.0–3.0)

## 2018-08-10 NOTE — Patient Instructions (Signed)
Description   Spoke with pt and instructed her to take 2.5 tablets today and take 2 tablets tomorrow then change dose to 1.5 tablets of Coumadin everyday except 2 tablets on Mondays, Wednesdays, and Fridays. Recheck in 2 weeks.

## 2018-08-18 DIAGNOSIS — B029 Zoster without complications: Secondary | ICD-10-CM | POA: Diagnosis not present

## 2018-08-23 ENCOUNTER — Telehealth: Payer: Self-pay

## 2018-08-23 NOTE — Telephone Encounter (Signed)
Unable to lmom no voiemail set up

## 2018-08-27 DIAGNOSIS — B029 Zoster without complications: Secondary | ICD-10-CM | POA: Diagnosis not present

## 2018-08-29 ENCOUNTER — Telehealth: Payer: Self-pay | Admitting: Pharmacist

## 2018-08-29 NOTE — Telephone Encounter (Signed)

## 2018-09-04 ENCOUNTER — Other Ambulatory Visit: Payer: Self-pay | Admitting: Cardiology

## 2018-09-11 ENCOUNTER — Telehealth: Payer: Self-pay | Admitting: *Deleted

## 2018-09-11 NOTE — Telephone Encounter (Signed)

## 2018-09-11 NOTE — Telephone Encounter (Signed)
Unable to leave a message, no voicemail.  

## 2018-09-18 ENCOUNTER — Ambulatory Visit (INDEPENDENT_AMBULATORY_CARE_PROVIDER_SITE_OTHER): Payer: Medicare Other | Admitting: Pharmacist

## 2018-09-18 ENCOUNTER — Other Ambulatory Visit: Payer: Self-pay

## 2018-09-18 DIAGNOSIS — I743 Embolism and thrombosis of arteries of the lower extremities: Secondary | ICD-10-CM | POA: Diagnosis not present

## 2018-09-18 DIAGNOSIS — Z86711 Personal history of pulmonary embolism: Secondary | ICD-10-CM

## 2018-09-18 DIAGNOSIS — Z5181 Encounter for therapeutic drug level monitoring: Secondary | ICD-10-CM | POA: Diagnosis not present

## 2018-09-18 DIAGNOSIS — I4821 Permanent atrial fibrillation: Secondary | ICD-10-CM

## 2018-09-18 LAB — POCT INR: INR: 2 (ref 2.0–3.0)

## 2018-09-18 NOTE — Patient Instructions (Signed)
Description   Take 2 tablets today and 2.5 tablets tomorrow, then start taking 1.5 tablets of Coumadin everyday except 2 tablets on Mondays, Wednesdays, and Fridays. Recheck in 2 weeks.

## 2018-09-25 ENCOUNTER — Telehealth: Payer: Self-pay

## 2018-09-25 NOTE — Telephone Encounter (Signed)

## 2018-10-02 ENCOUNTER — Other Ambulatory Visit: Payer: Self-pay

## 2018-10-02 ENCOUNTER — Ambulatory Visit (INDEPENDENT_AMBULATORY_CARE_PROVIDER_SITE_OTHER): Payer: Medicare Other | Admitting: *Deleted

## 2018-10-02 DIAGNOSIS — Z86711 Personal history of pulmonary embolism: Secondary | ICD-10-CM

## 2018-10-02 DIAGNOSIS — Z5181 Encounter for therapeutic drug level monitoring: Secondary | ICD-10-CM

## 2018-10-02 DIAGNOSIS — I4821 Permanent atrial fibrillation: Secondary | ICD-10-CM

## 2018-10-02 DIAGNOSIS — I743 Embolism and thrombosis of arteries of the lower extremities: Secondary | ICD-10-CM

## 2018-10-02 LAB — POCT INR: INR: 2.4 (ref 2.0–3.0)

## 2018-10-02 NOTE — Patient Instructions (Signed)
Description   Take 2 tablets today then continue taking 1.5 tablets of Coumadin everyday except 2 tablets on Mondays, Wednesdays, and Fridays. Recheck in 2 weeks. Coumadin Clinic # [phone]

## 2018-10-17 ENCOUNTER — Telehealth: Payer: Self-pay

## 2018-10-17 NOTE — Telephone Encounter (Signed)

## 2018-10-23 ENCOUNTER — Other Ambulatory Visit: Payer: Self-pay

## 2018-10-23 ENCOUNTER — Ambulatory Visit (INDEPENDENT_AMBULATORY_CARE_PROVIDER_SITE_OTHER): Payer: Medicare Other | Admitting: *Deleted

## 2018-10-23 DIAGNOSIS — Z86711 Personal history of pulmonary embolism: Secondary | ICD-10-CM

## 2018-10-23 DIAGNOSIS — Z7901 Long term (current) use of anticoagulants: Secondary | ICD-10-CM | POA: Diagnosis not present

## 2018-10-23 DIAGNOSIS — I743 Embolism and thrombosis of arteries of the lower extremities: Secondary | ICD-10-CM

## 2018-10-23 DIAGNOSIS — I4821 Permanent atrial fibrillation: Secondary | ICD-10-CM

## 2018-10-23 DIAGNOSIS — Z5181 Encounter for therapeutic drug level monitoring: Secondary | ICD-10-CM

## 2018-10-23 LAB — POCT INR: INR: 2.2 (ref 2.0–3.0)

## 2018-10-23 NOTE — Patient Instructions (Signed)
Description   Take 2 tablets today, then start taking 2 tablets daily except for 1.5 tablets on Sunday, Tuesday and Thursday. Recheck in 2 weeks. Coumadin Clinic # [phone]

## 2018-11-02 ENCOUNTER — Other Ambulatory Visit: Payer: Self-pay | Admitting: Cardiology

## 2018-11-06 ENCOUNTER — Ambulatory Visit (INDEPENDENT_AMBULATORY_CARE_PROVIDER_SITE_OTHER): Payer: Medicare Other | Admitting: *Deleted

## 2018-11-06 ENCOUNTER — Other Ambulatory Visit: Payer: Self-pay

## 2018-11-06 DIAGNOSIS — Z86711 Personal history of pulmonary embolism: Secondary | ICD-10-CM

## 2018-11-06 DIAGNOSIS — I743 Embolism and thrombosis of arteries of the lower extremities: Secondary | ICD-10-CM | POA: Diagnosis not present

## 2018-11-06 DIAGNOSIS — Z5181 Encounter for therapeutic drug level monitoring: Secondary | ICD-10-CM | POA: Diagnosis not present

## 2018-11-06 DIAGNOSIS — I4821 Permanent atrial fibrillation: Secondary | ICD-10-CM

## 2018-11-06 LAB — POCT INR: INR: 2.4 (ref 2.0–3.0)

## 2018-11-06 NOTE — Patient Instructions (Signed)
Description    Start taking 2 tablets daily except for 1.5 tablets on Sunday and Thursday. Recheck in 2 weeks. Coumadin Clinic # [phone]

## 2018-11-20 ENCOUNTER — Ambulatory Visit (INDEPENDENT_AMBULATORY_CARE_PROVIDER_SITE_OTHER): Payer: Medicare Other | Admitting: *Deleted

## 2018-11-20 ENCOUNTER — Other Ambulatory Visit: Payer: Self-pay

## 2018-11-20 DIAGNOSIS — I4821 Permanent atrial fibrillation: Secondary | ICD-10-CM

## 2018-11-20 DIAGNOSIS — I743 Embolism and thrombosis of arteries of the lower extremities: Secondary | ICD-10-CM

## 2018-11-20 DIAGNOSIS — Z86711 Personal history of pulmonary embolism: Secondary | ICD-10-CM

## 2018-11-20 DIAGNOSIS — Z5181 Encounter for therapeutic drug level monitoring: Secondary | ICD-10-CM | POA: Diagnosis not present

## 2018-11-20 LAB — POCT INR: INR: 2.9 (ref 2.0–3.0)

## 2018-11-20 NOTE — Patient Instructions (Signed)
Description   Continue taking 2 tablets daily except for 1.5 tablets on Sunday and Thursday. Recheck in 3 weeks. Coumadin Clinic # [phone]

## 2018-12-08 ENCOUNTER — Other Ambulatory Visit: Payer: Self-pay | Admitting: Cardiology

## 2018-12-11 ENCOUNTER — Ambulatory Visit (INDEPENDENT_AMBULATORY_CARE_PROVIDER_SITE_OTHER): Payer: Medicare Other | Admitting: *Deleted

## 2018-12-11 ENCOUNTER — Other Ambulatory Visit: Payer: Self-pay

## 2018-12-11 DIAGNOSIS — I743 Embolism and thrombosis of arteries of the lower extremities: Secondary | ICD-10-CM | POA: Diagnosis not present

## 2018-12-11 DIAGNOSIS — Z5181 Encounter for therapeutic drug level monitoring: Secondary | ICD-10-CM

## 2018-12-11 DIAGNOSIS — I4821 Permanent atrial fibrillation: Secondary | ICD-10-CM | POA: Diagnosis not present

## 2018-12-11 DIAGNOSIS — Z86711 Personal history of pulmonary embolism: Secondary | ICD-10-CM

## 2018-12-11 LAB — POCT INR: INR: 1.9 — AB (ref 2.0–3.0)

## 2018-12-11 NOTE — Patient Instructions (Signed)
Description   Today and tomorrow take 2.5 tablets then continue taking 2 tablets daily except for 1.5 tablets on Sundays and Thursdays. Recheck in 3 weeks. Coumadin Clinic # [phone]

## 2019-01-01 ENCOUNTER — Other Ambulatory Visit: Payer: Self-pay

## 2019-01-01 ENCOUNTER — Ambulatory Visit (INDEPENDENT_AMBULATORY_CARE_PROVIDER_SITE_OTHER): Payer: Medicare Other | Admitting: *Deleted

## 2019-01-01 DIAGNOSIS — Z86711 Personal history of pulmonary embolism: Secondary | ICD-10-CM

## 2019-01-01 DIAGNOSIS — Z5181 Encounter for therapeutic drug level monitoring: Secondary | ICD-10-CM

## 2019-01-01 DIAGNOSIS — I743 Embolism and thrombosis of arteries of the lower extremities: Secondary | ICD-10-CM | POA: Diagnosis not present

## 2019-01-01 DIAGNOSIS — I4821 Permanent atrial fibrillation: Secondary | ICD-10-CM | POA: Diagnosis not present

## 2019-01-01 LAB — POCT INR: INR: 2.4 (ref 2.0–3.0)

## 2019-01-01 NOTE — Patient Instructions (Signed)
Description   Take an extra 1/2 tablet today, then start taking 2 tablets daily except for 1.5 tablets onThursdays. Recheck in 3 weeks. Coumadin Clinic # [phone]

## 2019-01-14 ENCOUNTER — Other Ambulatory Visit: Payer: Self-pay

## 2019-01-14 ENCOUNTER — Ambulatory Visit (HOSPITAL_COMMUNITY): Payer: Self-pay | Admitting: Cardiology

## 2019-01-14 ENCOUNTER — Telehealth: Payer: Self-pay | Admitting: Cardiology

## 2019-01-14 MED ORDER — METOPROLOL TARTRATE 25 MG PO TABS: ORAL_TABLET | ORAL | 0 refills | Status: DC

## 2019-01-14 NOTE — Telephone Encounter (Signed)
°*  STAT* If patient is at the pharmacy, call can be transferred to refill team.   1. Which medications need to be refilled? (please list name of each medication and dose if known) Metoprolol- pt have an appt on 01-31-19  2. Which pharmacy/location (including street and city if local pharmacy) is medication to be sent to? [place], [place]  3. Do they need a 30 day or 90 day supply?  Enough until her appt on 01-31-19

## 2019-01-17 ENCOUNTER — Other Ambulatory Visit: Payer: Self-pay | Admitting: Cardiology

## 2019-01-28 ENCOUNTER — Other Ambulatory Visit: Payer: Self-pay

## 2019-01-28 ENCOUNTER — Ambulatory Visit (INDEPENDENT_AMBULATORY_CARE_PROVIDER_SITE_OTHER): Payer: Medicare Other | Admitting: *Deleted

## 2019-01-28 DIAGNOSIS — Z86711 Personal history of pulmonary embolism: Secondary | ICD-10-CM

## 2019-01-28 DIAGNOSIS — I4821 Permanent atrial fibrillation: Secondary | ICD-10-CM

## 2019-01-28 DIAGNOSIS — Z5181 Encounter for therapeutic drug level monitoring: Secondary | ICD-10-CM | POA: Diagnosis not present

## 2019-01-28 DIAGNOSIS — I743 Embolism and thrombosis of arteries of the lower extremities: Secondary | ICD-10-CM | POA: Diagnosis not present

## 2019-01-28 LAB — POCT INR: INR: 2 (ref 2.0–3.0)

## 2019-01-28 NOTE — Patient Instructions (Addendum)
Description   Take an extra 1/2 tablet today (since you missed Saturday's dose) then continue taking 2 tablets daily except for 1.5 tablets on Thursdays. Recheck in 3 weeks. Coumadin Clinic # [phone]

## 2019-01-29 NOTE — Progress Notes (Signed)
Cardiology Office Note   Date:  01/31/2019   ID:  [name], DOB 11-Apr-1945, MRN [id number]  PCP:  Melody Huddle, MD  Cardiologist:   No primary care provider on file.   Chief Complaint  Patient presents with  . Atrial Fibrillation      History of Present Illness: [name] is a 73 y.o. female who presents for follow up of atrial fib.  She has a history of lupus anticoagulant.  She has had DVTs and bilateral PEs.  She has IVC filter bilat femoral artery emboli s/p bilat embolectomies in [id number], and embolic CVA in [id number] requiring TPA with subsequent hemorrhagic conversion, and most recently, RLE popliteal embolism (in setting of subtherapeutic INR of 1.43) requiring surgery 02/16/17. She developed a wound infection requiring surgery 03/05/17 and wound vac. She has an EF to be 40-45% which is down from her last echo.   She returns for follow up.  She is doing very well.  She gets around with a cane with a little residual left-sided weakness following her previous stroke.  She does not have any new chest discomfort, neck or arm discomfort.  She had no new shortness of breath, PND or orthopnea.  She had a little weight gain.  She has not been exercising because the gyms are closed.  She has had no problems with her Coumadin.   Past Medical History:  Diagnosis Date  . (HFpEF) heart failure with preserved ejection fraction ([place])    a. 06/2008 Echo: EF 55-60%, mild LVH, triv AI, mild to mod MS, mod to sev dil LA, mildly dil RA.  Marland Kitchen Acute right MCA stroke ([place])    a. [id number] Embolic stroke treated with TPA and thrombectomy with hemorrhagic transformation; Carotids 3/12 negative for ICA stenosis.  . Bilateral Pulmonary Emboli    a. 08/2005 - s/p IVC filter.  . Depression   . Embolus of femoral artery ([place])    a. 06/2008 s/p bilateral embolectomies.  . History of DVT (deep vein thrombosis)    a. s/p IVC filter.  Marland Kitchen HIT (heparin-induced thrombocytopenia) ([place])   . HTN  (hypertension)   . Lupus anticoagulant disorder ([place])   . Obesity, unspecified   . Permanent atrial fibrillation ([place])    a. On chronic Coumadin - INRs followed by PCP; b. CHA2DS2VASc = 5.  . Popliteal artery embolism, right ([place])    a. 01/2017 in the setting of subRx INR-->s/p embolectomy.    Past Surgical History:  Procedure Laterality Date  . distal radius fracture. (12,/16/2010)    . EMBOLECTOMY Right 02/15/2017   Procedure: EMBOLECTOMY RIGHT LEG;  Surgeon: Melody Dutch, MD;  Location: [facility];  Service: Vascular;  Laterality: Right;  . I&D EXTREMITY Right 03/05/2017   Procedure: IRRIGATION AND DEBRIDEMENT RIGHT LEG HEMATOMA EVACUATION;  Surgeon: Melody Dutch, MD;  Location: [facility];  Service: Vascular;  Laterality: Right;  . IVC Placement 08/30/2005    . Open reduction and internal fixation of left intra-articular       Current Outpatient Medications  Medication Sig Dispense Refill  . acetaminophen (TYLENOL) 500 MG tablet Take 1,000 mg by mouth every 6 (six) hours as needed for mild pain.    Marland Kitchen aspirin 81 MG tablet Take 81 mg by mouth daily.      . cholecalciferol (VITAMIN D) 1000 units tablet Take 4,000 Units by mouth daily.    Marland Kitchen FLUoxetine (PROZAC) 20 MG capsule Take 20 mg by mouth daily.    Marland Kitchen  furosemide (LASIX) 40 MG tablet Take 1 tablet by mouth once daily 90 tablet 1  . metoprolol tartrate (LOPRESSOR) 25 MG tablet Take 1 tablet (25 mg total) by mouth 2 (two) times daily. TAKE 1/2 (ONE-HALF) TABLET BY MOUTH TWICE DAILY 180 tablet 1  . potassium chloride (K-DUR) 10 MEQ tablet TAKE 1 TABLET BY MOUTH ONCE DAILY 90 tablet 1  . warfarin (COUMADIN) 3 MG tablet TAKE 1 & 1/2 TABLETS (ONE & ONE-HALF) TO 2  TABLETS BY MOUTH ONCE DAILY OR AS DIRECTED BY  COUMADIN  CLINIC 180 tablet 1  . losartan (COZAAR) 50 MG tablet Take 1 tablet (50 mg total) by mouth daily. 90 tablet 1   No current facility-administered medications for this visit.     Allergies:   Heparin and Other    ROS:   Please see the history of present illness.   Otherwise, review of systems are positive for none. .   All other systems are reviewed and negative.    PHYSICAL EXAM: VS:  BP (!) 164/83   Pulse 76   Temp (!) 97.2 F (36.2 C)   Ht 5\' 6"  (1.676 m)   Wt 274 lb (124.3 kg)   SpO2 96%   BMI 44.22 kg/m  , BMI Body mass index is 44.22 kg/m.  GENERAL:  Well appearing NECK:  No jugular venous distention, waveform within normal limits, carotid upstroke brisk and symmetric, no bruits, no thyromegaly LUNGS:  Clear to auscultation bilaterally CHEST:  Unremarkable HEART:  PMI not displaced or sustained,S1 and S2 within normal limits, no S3, no clicks, no rubs, no murmurs, irregular ABD:  Flat, positive bowel sounds normal in frequency in pitch, no bruits, no rebound, no guarding, no midline pulsatile mass, no hepatomegaly, no splenomegaly EXT:  2 plus pulses throughout, mild left greater than right chronic edema, no cyanosis no clubbing NEURO: Mild left-sided weakness   EKG:  EKG is ordered today. Atrial fibrillation, rate 65, axis within normal limits, intervals within normal limits, no acute ST-T wave changes.   Recent Labs: No results found for requested labs within last 8760 hours.    Lipid Panel   Wt Readings from Last 3 Encounters:  01/31/19 274 lb (124.3 kg)  10/30/17 286 lb 3.2 oz (129.8 kg)  06/27/17 274 lb (124.3 kg)      Other studies Reviewed: Additional studies/ records that were reviewed today include: Labs Review of the above records demonstrates:  See below     ASSESSMENT AND PLAN:   HTN Her blood pressure is elevated.  I am to make adjustments for control of her blood pressure in the context of managing her heart failure as below.  NICM (nonischemic cardiomyopathy) ([place]) Presumed NICM. EF 40-45% in Dec 2018.  I am going to stop her Cardizem.  I am going to increase metoprolol to 25 mg twice daily.  I did add Cozaar 50 mg daily.  I like to see her back in about a  month with our APP for med titration.   Permanent atrial fibrillation ([place]) [name] has a CHA2DS2 - VASc score of 5.  She is getting continue anticoagulation.  We will have to follow her closely for heart rate control as no change in meds as above.  History of lupus anticoagulant disorder She remains on warfarin.     Long term (current) use of anticoagulants Continuing anticoagulation.   Obesity She got a stationary bicycle and excited about the fact that she will start to use  this.  We talked about weight management.      Current medicines are reviewed at length with the patient today.  The patient does not have concerns regarding medicines.  The following changes have been made:  As above  Labs/ tests ordered today include:  None  Orders Placed This Encounter  Procedures  . EKG 12-Lead     Disposition:   FU APP in one month.     Signed, Minus Breeding, MD  01/31/2019 8:11 AM    [place]

## 2019-01-31 ENCOUNTER — Ambulatory Visit (INDEPENDENT_AMBULATORY_CARE_PROVIDER_SITE_OTHER): Payer: Medicare Other | Admitting: Cardiology

## 2019-01-31 ENCOUNTER — Other Ambulatory Visit: Payer: Self-pay | Admitting: *Deleted

## 2019-01-31 ENCOUNTER — Other Ambulatory Visit: Payer: Self-pay

## 2019-01-31 ENCOUNTER — Encounter: Payer: Self-pay | Admitting: Cardiology

## 2019-01-31 VITALS — BP 164/83 | HR 76 | Temp 97.2°F | Ht 66.0 in | Wt 274.0 lb

## 2019-01-31 DIAGNOSIS — I4821 Permanent atrial fibrillation: Secondary | ICD-10-CM | POA: Diagnosis not present

## 2019-01-31 DIAGNOSIS — I10 Essential (primary) hypertension: Secondary | ICD-10-CM | POA: Diagnosis not present

## 2019-01-31 DIAGNOSIS — I428 Other cardiomyopathies: Secondary | ICD-10-CM

## 2019-01-31 DIAGNOSIS — I743 Embolism and thrombosis of arteries of the lower extremities: Secondary | ICD-10-CM | POA: Diagnosis not present

## 2019-01-31 MED ORDER — METOPROLOL TARTRATE 25 MG PO TABS: 25.0000 mg | ORAL_TABLET | Freq: Two times a day (BID) | ORAL | 1 refills | Status: DC

## 2019-01-31 MED ORDER — LOSARTAN POTASSIUM 50 MG PO TABS: 50.0000 mg | ORAL_TABLET | Freq: Every day | ORAL | 1 refills | Status: DC

## 2019-01-31 NOTE — Telephone Encounter (Signed)
Rx has been sent to the pharmacy electronically. ° °

## 2019-01-31 NOTE — Patient Instructions (Signed)
Medication Instructions:   STOP CARTIA   START COZAAR 50 MG 2 TIMES A DAY   INCREASE METOPROLOL TO 25 MG 2 TIMES A DAY   *If you need a refill on your cardiac medications before your next appointment, please call your pharmacy*  Lab Work: NONE ordered at this time of appointment   If you have labs (blood work) drawn today and your tests are completely normal, you will receive your results only by: Marland Kitchen MyChart Message (if you have MyChart) OR . A paper copy in the mail If you have any lab test that is abnormal or we need to change your treatment, we will call you to review the results.  Testing/Procedures: NONE ordered at this time of appointment   Follow-Up: At [facility], you and your health needs are our priority.  As part of our continuing mission to provide you with exceptional heart care, we have created designated Provider Care Teams.  These Care Teams include your primary Cardiologist (physician) and Advanced Practice Providers (APPs -  Physician Assistants and Nurse Practitioners) who all work together to provide you with the care you need, when you need it.  Your next appointment:   1 MONTH  The format for your next appointment:   In Person  Provider:   Rosaria Ferries, PA-C OR Jory Sims, DNP  Other Instructions

## 2019-02-06 ENCOUNTER — Other Ambulatory Visit: Payer: Self-pay

## 2019-02-06 MED ORDER — METOPROLOL TARTRATE 25 MG PO TABS: 25.0000 mg | ORAL_TABLET | Freq: Two times a day (BID) | ORAL | 1 refills | Status: DC

## 2019-02-26 ENCOUNTER — Ambulatory Visit (INDEPENDENT_AMBULATORY_CARE_PROVIDER_SITE_OTHER): Payer: Medicare Other | Admitting: *Deleted

## 2019-02-26 ENCOUNTER — Other Ambulatory Visit: Payer: Self-pay

## 2019-02-26 DIAGNOSIS — Z86711 Personal history of pulmonary embolism: Secondary | ICD-10-CM

## 2019-02-26 DIAGNOSIS — I4821 Permanent atrial fibrillation: Secondary | ICD-10-CM

## 2019-02-26 DIAGNOSIS — Z5181 Encounter for therapeutic drug level monitoring: Secondary | ICD-10-CM | POA: Diagnosis not present

## 2019-02-26 DIAGNOSIS — I743 Embolism and thrombosis of arteries of the lower extremities: Secondary | ICD-10-CM | POA: Diagnosis not present

## 2019-02-26 LAB — POCT INR: INR: 4.7 — AB (ref 2.0–3.0)

## 2019-02-26 NOTE — Patient Instructions (Addendum)
Description   Hold today's dose and take 1 tablet tomorrow then continue taking 2 tablets daily except for 1.5 tablets on Thursdays. Recheck in 3 weeks. Coumadin Clinic # [phone]

## 2019-03-04 NOTE — Progress Notes (Signed)
Cardiology Office Note   Date:  03/06/2019   ID:  [name], DOB 1945/09/13, MRN [id number]  PCP:  Melody Huddle, MD  Cardiologist:  Dr. Percival Spanish  CC: Follow Up   History of Present Illness: [name] is a 73 y.o. female who presents for ongoing assessment and management of atrial fibrillation with other history to include DVTs with bilateral PEs, IVC filter to bilateral femoral artery, status post bilateral embolectomies on 06/2008, history of embolic CVA in 0000000 requiring TPA with subsequent hemorrhage conversion, right lower extremity popliteal embolism in the setting of subtherapeutic INR 1.43, requiring surgical intervention.  She developed a wound infection requiring surgery on 03/05/2017 and a wound VAC was placed.  She also has a history of lupus anticoagulation disorder, obesity, nonischemic cardiomyopathy with an EF of 40% to 45%.  Most recent echocardiogram on [id number] revealed systolic function was mildly to moderately reduced, with an EF of 40% to 45%.  Left atrium was massively dilated, right atrium was moderately dilated.    She was last seen by Dr. Percival Spanish on 01/31/2019.  At that time blood pressure was elevated and therefore metoprolol was increased to 25 mg twice daily, and Cozaar 50 mg daily.  She was to continue anticoagulation CHADS VASC Score of 5.   She comes today without complaints. She denies chest pain, rapid HR, or palpitations. She remains hypertensive   Past Medical History:  Diagnosis Date  . (HFpEF) heart failure with preserved ejection fraction ([place])    a. 06/2008 Echo: EF 55-60%, mild LVH, triv AI, mild to mod MS, mod to sev dil LA, mildly dil RA.  Marland Kitchen Acute right MCA stroke ([place])    a. [id number] Embolic stroke treated with TPA and thrombectomy with hemorrhagic transformation; Carotids 3/12 negative for ICA stenosis.  . Bilateral Pulmonary Emboli    a. 08/2005 - s/p IVC filter.  . Depression   . Embolus of femoral artery ([place])    a. 06/2008 s/p  bilateral embolectomies.  . History of DVT (deep vein thrombosis)    a. s/p IVC filter.  Marland Kitchen HIT (heparin-induced thrombocytopenia) ([place])   . HTN (hypertension)   . Lupus anticoagulant disorder ([place])   . Obesity, unspecified   . Permanent atrial fibrillation ([place])    a. On chronic Coumadin - INRs followed by PCP; b. CHA2DS2VASc = 5.  . Popliteal artery embolism, right ([place])    a. 01/2017 in the setting of subRx INR-->s/p embolectomy.    Past Surgical History:  Procedure Laterality Date  . distal radius fracture. (12,/16/2010)    . EMBOLECTOMY Right 02/15/2017   Procedure: EMBOLECTOMY RIGHT LEG;  Surgeon: Elam Dutch, MD;  Location: [facility];  Service: Vascular;  Laterality: Right;  . I&D EXTREMITY Right 03/05/2017   Procedure: IRRIGATION AND DEBRIDEMENT RIGHT LEG HEMATOMA EVACUATION;  Surgeon: Elam Dutch, MD;  Location: [facility];  Service: Vascular;  Laterality: Right;  . IVC Placement 08/30/2005    . Open reduction and internal fixation of left intra-articular       Current Outpatient Medications  Medication Sig Dispense Refill  . acetaminophen (TYLENOL) 500 MG tablet Take 1,000 mg by mouth every 6 (six) hours as needed for mild pain.    Marland Kitchen aspirin 81 MG tablet Take 81 mg by mouth daily.      . cholecalciferol (VITAMIN D) 1000 units tablet Take 4,000 Units by mouth daily.    Marland Kitchen FLUoxetine (PROZAC) 20 MG capsule Take 20 mg by mouth daily.    Marland Kitchen  furosemide (LASIX) 40 MG tablet Take 1 tablet (40 mg total) by mouth daily. 90 tablet 2  . losartan (COZAAR) 100 MG tablet Take 1 tablet (100 mg total) by mouth daily. 90 tablet 2  . metoprolol tartrate (LOPRESSOR) 25 MG tablet Take 1 tablet (25 mg total) by mouth 2 (two) times daily. 180 tablet 1  . potassium chloride (K-DUR) 10 MEQ tablet TAKE 1 TABLET BY MOUTH ONCE DAILY 90 tablet 1  . warfarin (COUMADIN) 3 MG tablet TAKE 1 & 1/2 TABLETS (ONE & ONE-HALF) TO 2  TABLETS BY MOUTH ONCE DAILY OR AS DIRECTED BY  COUMADIN  CLINIC 180 tablet 1    No current facility-administered medications for this visit.     Allergies:   Heparin and Other    Social History:  The patient  reports that she has never smoked. She has never used smokeless tobacco. She reports that she does not drink alcohol or use drugs.   Family History:  The patient's family history includes Coronary artery disease in her unknown relative; Diabetes in her unknown relative.    ROS: All other systems are reviewed and negative. Unless otherwise mentioned in H&P    PHYSICAL EXAM: VS:  BP (!) 168/78   Pulse 90   Ht 5\' 6"  (1.676 m)   Wt 270 lb 6.4 oz (122.7 kg)   SpO2 96%   BMI 43.64 kg/m  , BMI Body mass index is 43.64 kg/m. GEN: Well nourished, well developed, in no acute distress, morbidly obese  HEENT: normal Neck: no JVD, carotid bruits, or masses Cardiac: IRRR; no murmurs, rubs, or gallops,no edema  Respiratory:  Clear to auscultation bilaterally, normal work of breathing GI: soft, nontender, nondistended, + BS MS: no deformity or atrophy Skin: warm and dry, no rash Neuro:  Strength and sensation are intact Psych: euthymic mood, full affect   EKG:  Not completed this office visit.   Recent Labs: No results found for requested labs within last 8760 hours.    Lipid Panel    Component Value Date/Time   CHOL  06/04/2010 0359    139        ATP III CLASSIFICATION:  <200     mg/dL   Desirable  200-239  mg/dL   Borderline High  >=240    mg/dL   High          TRIG 71 06/04/2010 0359   HDL 33 (L) 06/04/2010 0359   CHOLHDL 4.2 06/04/2010 0359   VLDL 14 06/04/2010 0359   LDLCALC  06/04/2010 0359    92        Total Cholesterol/HDL:CHD Risk Coronary Heart Disease Risk Table                     Men   Women  1/2 Average Risk   3.4   3.3  Average Risk       5.0   4.4  2 X Average Risk   9.6   7.1  3 X Average Risk  23.4   11.0        Use the calculated Patient Ratio above and the CHD Risk Table to determine the patient's CHD Risk.         ATP III CLASSIFICATION (LDL):  <100     mg/dL   Optimal  100-129  mg/dL   Near or Above                    Optimal  130-159  mg/dL   Borderline  160-189  mg/dL   High  >190     mg/dL   Very High      Wt Readings from Last 3 Encounters:  03/06/19 270 lb 6.4 oz (122.7 kg)  01/31/19 274 lb (124.3 kg)  10/30/17 286 lb 3.2 oz (129.8 kg)      Other studies Reviewed: Echocardiogram 04/23/17 Left ventricle: The cavity size was normal. Systolic function was   mildly to moderately reduced. The estimated ejection fraction was   in the range of 40% to 45%. Regional wall motion abnormalities   cannot be excluded. - Ventricular septum: Septal motion showed abnormal function and   dyssynergy. These changes are consistent with RV-LV interaction. - Mitral valve: Mildly calcified annulus. Mildly calcified leaflets   . There was mild regurgitation. - Left atrium: The atrium was massively dilated. - Right atrium: The atrium was moderately dilated.  ASSESSMENT AND PLAN:  1. Hypertension: BP remains elevated despite the addition of metoprolol. I rechecked it in the exam room, 178/68. Will increase losartan to 100 mg daily from 50 mg daily, continue metoprolol, and lasix.  Repeat BMET in 2 weeks. May need to consider sleep study if BP is more difficult to control. If BMET okay, may add spironolactone.   2. Permanent Atrial fibrillation: HR is well controlled on metoprolol 25 mg BID.Coumadin is followed by our clinic located on [address].   3. NICM: Last echo in 2018 demonstrated EF of 40%-45%.  Once BP is better controlled, will need to repeat echo for assessment of LV fx.      Current medicines are reviewed at length with the patient today.    Labs/ tests ordered today include: BMET  Phill Myron. West Pugh, ANP, AACC   03/06/2019 10:15 AM    [facility] [address] Office [phone] Fax [phone]  Notice: This dictation was prepared  with Dragon dictation along with smaller phrase technology. Any transcriptional errors that result from this process are unintentional and may not be corrected upon review.

## 2019-03-06 ENCOUNTER — Encounter: Payer: Self-pay | Admitting: Adult Health

## 2019-03-06 ENCOUNTER — Other Ambulatory Visit: Payer: Self-pay

## 2019-03-06 ENCOUNTER — Ambulatory Visit (INDEPENDENT_AMBULATORY_CARE_PROVIDER_SITE_OTHER): Payer: Medicare Other | Admitting: Adult Health

## 2019-03-06 VITALS — BP 168/78 | HR 90 | Ht 66.0 in | Wt 270.4 lb

## 2019-03-06 DIAGNOSIS — I10 Essential (primary) hypertension: Secondary | ICD-10-CM | POA: Diagnosis not present

## 2019-03-06 DIAGNOSIS — I4819 Other persistent atrial fibrillation: Secondary | ICD-10-CM | POA: Diagnosis not present

## 2019-03-06 DIAGNOSIS — I743 Embolism and thrombosis of arteries of the lower extremities: Secondary | ICD-10-CM | POA: Diagnosis not present

## 2019-03-06 DIAGNOSIS — Z23 Encounter for immunization: Secondary | ICD-10-CM

## 2019-03-06 DIAGNOSIS — Z79899 Other long term (current) drug therapy: Secondary | ICD-10-CM

## 2019-03-06 MED ORDER — FUROSEMIDE 40 MG PO TABS: 40.0000 mg | ORAL_TABLET | Freq: Every day | ORAL | 2 refills | Status: DC

## 2019-03-06 MED ORDER — LOSARTAN POTASSIUM 100 MG PO TABS: 100.0000 mg | ORAL_TABLET | Freq: Every day | ORAL | 2 refills | Status: DC

## 2019-03-06 NOTE — Patient Instructions (Signed)
Medication Instructions:  INCREASE- Losartan 100 mg by mouth daily  *If you need a refill on your cardiac medications before your next appointment, please call your pharmacy*  Lab Work: BMP 2 weeks  If you have labs (blood work) drawn today and your tests are completely normal, you will receive your results only by: Marland Kitchen MyChart Message (if you have MyChart) OR . A paper copy in the mail If you have any lab test that is abnormal or we need to change your treatment, we will call you to review the results.  Testing/Procedures: None Ordered  Follow-Up: At [facility], you and your health needs are our priority.  As part of our continuing mission to provide you with exceptional heart care, we have created designated Provider Care Teams.  These Care Teams include your primary Cardiologist (physician) and Advanced Practice Providers (APPs -  Physician Assistants and Nurse Practitioners) who all work together to provide you with the care you need, when you need it.  Your next appointment:   Monday January 11th @ 8:45 am  The format for your next appointment:   In Person  Provider:   Jory Sims, DNP, ANP

## 2019-03-26 ENCOUNTER — Ambulatory Visit (INDEPENDENT_AMBULATORY_CARE_PROVIDER_SITE_OTHER): Payer: Medicare Other | Admitting: *Deleted

## 2019-03-26 ENCOUNTER — Other Ambulatory Visit: Payer: Self-pay

## 2019-03-26 DIAGNOSIS — I743 Embolism and thrombosis of arteries of the lower extremities: Secondary | ICD-10-CM

## 2019-03-26 DIAGNOSIS — Z5181 Encounter for therapeutic drug level monitoring: Secondary | ICD-10-CM

## 2019-03-26 DIAGNOSIS — Z86711 Personal history of pulmonary embolism: Secondary | ICD-10-CM

## 2019-03-26 DIAGNOSIS — I4821 Permanent atrial fibrillation: Secondary | ICD-10-CM

## 2019-03-26 LAB — POCT INR: INR: 5.3 — AB (ref 2.0–3.0)

## 2019-03-26 NOTE — Patient Instructions (Signed)
Description   Hold today's dose and hold tomorrow's dose then start taking 2 tablets daily except for 1.5 tablets on Sundays and Thursdays. Recheck in 2 weeks. Coumadin Clinic # [phone]

## 2019-03-29 HISTORY — PX: EYE SURGERY: SHX253

## 2019-04-08 ENCOUNTER — Ambulatory Visit: Payer: Medicare Other | Admitting: Adult Health

## 2019-04-12 ENCOUNTER — Other Ambulatory Visit: Payer: Self-pay

## 2019-04-12 ENCOUNTER — Ambulatory Visit (INDEPENDENT_AMBULATORY_CARE_PROVIDER_SITE_OTHER): Payer: Medicare Other | Admitting: *Deleted

## 2019-04-12 DIAGNOSIS — I743 Embolism and thrombosis of arteries of the lower extremities: Secondary | ICD-10-CM

## 2019-04-12 DIAGNOSIS — Z5181 Encounter for therapeutic drug level monitoring: Secondary | ICD-10-CM

## 2019-04-12 DIAGNOSIS — I4821 Permanent atrial fibrillation: Secondary | ICD-10-CM

## 2019-04-12 DIAGNOSIS — Z86711 Personal history of pulmonary embolism: Secondary | ICD-10-CM

## 2019-04-12 LAB — POCT INR: INR: 3.5 — AB (ref 2.0–3.0)

## 2019-04-12 NOTE — Patient Instructions (Signed)
Description   Today take 1.5 tablets then continue taking 2 tablets daily except for 1.5 tablets on Sundays and Thursdays. Recheck in 3 weeks. Coumadin Clinic # [phone]

## 2019-04-13 NOTE — Progress Notes (Signed)
Cardiology Office Note   Date:  04/15/2019   ID:  [name], DOB 1945-07-31, MRN [id number]  PCP:  Melody Huddle, MD  Cardiologist: Dr. Percival Spanish CC: Follow Up    History of Present Illness: [name] is a 74 y.o. female who presents for ongoing assessment and management of atrial fibrillation with other history to include DVTs with bilateral PEs, IVC filter to bilateral femoral artery, status post bilateral embolectomies on 06/2008, history of embolic CVA in 0000000 requiring TPA with subsequent hemorrhage conversion, right lower extremity popliteal embolism in the setting of subtherapeutic INR 1.43, requiring surgical intervention.    She developed a wound infection requiring surgery on 03/05/2017 and a wound VAC was placed.  She also has a history of lupus anticoagulation disorder, obesity, nonischemic cardiomyopathy with an EF of 40% to 45%. She was to continue anticoagulation CHADS VASC Score of 5 on Coumadin followed by our clinic on [address].   Last seen in the office on 03/06/2019, and was found to be hypertensive.  She recently had addition of metoprolol.  As result of this I increase her losartan to 100 mg daily from 50 mg daily and continue metoprolol and Lasix.  A repeat BMET was to be completed with the possibility of adding spironolactone,.a sleep study would be considered if blood pressure was not well controlled.  She comes today without any complaints. She tolerated the increased dose of losartan 100 mg daily without complaints of dizziness or weakness. She denies any orthostatic symptoms. She does have whitecoat syndrome and when she comes to the office blood pressure is usually very elevated, at home her blood pressures runs in the [id number] to 0000000 systolic. She is followed by Coumadin clinic and is due to see them in 2 weeks.   Past Medical History:  Diagnosis Date  . (HFpEF) heart failure with preserved ejection fraction ([place])    a. 06/2008 Echo: EF 55-60%, mild LVH,  triv AI, mild to mod MS, mod to sev dil LA, mildly dil RA.  Marland Kitchen Acute right MCA stroke ([name])    a. [id number] Embolic stroke treated with TPA and thrombectomy with hemorrhagic transformation; Carotids 3/12 negative for ICA stenosis.  . Bilateral Pulmonary Emboli    a. 08/2005 - s/p IVC filter.  . Depression   . Embolus of femoral artery ([name])    a. 06/2008 s/p bilateral embolectomies.  . History of DVT (deep vein thrombosis)    a. s/p IVC filter.  Marland Kitchen HIT (heparin-induced thrombocytopenia) ([name])   . HTN (hypertension)   . Lupus anticoagulant disorder ([name])   . Obesity, unspecified   . Permanent atrial fibrillation ([name])    a. On chronic Coumadin - INRs followed by PCP; b. CHA2DS2VASc = 5.  . Popliteal artery embolism, right ([name])    a. 01/2017 in the setting of subRx INR-->s/p embolectomy.    Past Surgical History:  Procedure Laterality Date  . distal radius fracture. (12,/16/2010)    . EMBOLECTOMY Right 02/15/2017   Procedure: EMBOLECTOMY RIGHT LEG;  Surgeon: Melody Dutch, MD;  Location: [facility] OR;  Service: Vascular;  Laterality: Right;  . I & D EXTREMITY Right 03/05/2017   Procedure: IRRIGATION AND DEBRIDEMENT RIGHT LEG HEMATOMA EVACUATION;  Surgeon: Melody Dutch, MD;  Location: [facility];  Service: Vascular;  Laterality: Right;  . IVC Placement 08/30/2005    . Open reduction and internal fixation of left intra-articular       Current Outpatient Medications  Medication Sig Dispense Refill  .  acetaminophen (TYLENOL) 500 MG tablet Take 1,000 mg by mouth every 6 (six) hours as needed for mild pain.    . Ascorbic Acid (VITA-C PO) Take 1 tablet by mouth daily.    Marland Kitchen aspirin 81 MG tablet Take 81 mg by mouth daily.      . cholecalciferol (VITAMIN D) 1000 units tablet Take 4,000 Units by mouth daily.    Marland Kitchen FLUoxetine (PROZAC) 20 MG capsule Take 20 mg by mouth daily.    . furosemide (LASIX) 40 MG tablet Take 1 tablet (40 mg total) by mouth daily. 90 tablet 2  . losartan (COZAAR) 100 MG  tablet Take 1 tablet (100 mg total) by mouth daily. 90 tablet 2  . metoprolol tartrate (LOPRESSOR) 25 MG tablet Take 1 tablet (25 mg total) by mouth 2 (two) times daily. 180 tablet 1  . Multiple Vitamins-Minerals (ZINC PO) Take 1 tablet by mouth daily.    . potassium chloride (K-DUR) 10 MEQ tablet TAKE 1 TABLET BY MOUTH ONCE DAILY 90 tablet 1  . warfarin (COUMADIN) 3 MG tablet TAKE 1 & 1/2 TABLETS (ONE & ONE-HALF) TO 2  TABLETS BY MOUTH ONCE DAILY OR AS DIRECTED BY  COUMADIN  CLINIC 180 tablet 1   No current facility-administered medications for this visit.    Allergies:   Heparin and Other    Social History:  The patient  reports that she has never smoked. She has never used smokeless tobacco. She reports that she does not drink alcohol or use drugs.   Family History:  The patient's family history includes Coronary artery disease in her unknown relative; Diabetes in her unknown relative.    ROS: All other systems are reviewed and negative. Unless otherwise mentioned in H&P    PHYSICAL EXAM: VS:  BP (!) 178/108   Pulse 88   Ht 5\' 6"  (1.676 m)   Wt 272 lb 3.2 oz (123.5 kg)   SpO2 97%   BMI 43.93 kg/m  , BMI Body mass index is 43.93 kg/m. GEN: Well nourished, well developed, in no acute distress, morbidly obese HEENT: normal Neck: no JVD, carotid bruits, or masses Cardiac: IRRR; no murmurs, rubs, or gallops,no edema  Respiratory:  Clear to auscultation bilaterally, normal work of breathing GI: soft, nontender, nondistended, + BS MS: no deformity or atrophy, ambulates with a cane Skin: warm and dry, no rash Neuro:  Strength and sensation are intact Psych: euthymic mood, full affect   EKG: Not completed this office visit  Recent Labs: No results found for requested labs within last 8760 hours.    Lipid Panel    Component Value Date/Time   CHOL  06/04/2010 0359    139        ATP III CLASSIFICATION:  <200     mg/dL   Desirable  200-239  mg/dL   Borderline High  >=240     mg/dL   High          TRIG 71 06/04/2010 0359   HDL 33 (L) 06/04/2010 0359   CHOLHDL 4.2 06/04/2010 0359   VLDL 14 06/04/2010 0359   LDLCALC  06/04/2010 0359    92        Total Cholesterol/HDL:CHD Risk Coronary Heart Disease Risk Table                     Men   Women  1/2 Average Risk   3.4   3.3  Average Risk       5.0  4.4  2 X Average Risk   9.6   7.1  3 X Average Risk  23.4   11.0        Use the calculated Patient Ratio above and the CHD Risk Table to determine the patient's CHD Risk.        ATP III CLASSIFICATION (LDL):  <100     mg/dL   Optimal  100-129  mg/dL   Near or Above                    Optimal  130-159  mg/dL   Borderline  160-189  mg/dL   High  >190     mg/dL   Very High      Wt Readings from Last 3 Encounters:  04/15/19 272 lb 3.2 oz (123.5 kg)  03/06/19 270 lb 6.4 oz (122.7 kg)  01/31/19 274 lb (124.3 kg)      Other studies Reviewed: Echocardiogram 04/15/17 Left ventricle: The cavity size was normal. Systolic function was mildly to moderately reduced. The estimated ejection fraction was in the range of 40% to 45%. Regional wall motion abnormalities cannot be excluded. - Ventricular septum: Septal motion showed abnormal function and dyssynergy. These changes are consistent with RV-LV interaction. - Mitral valve: Mildly calcified annulus. Mildly calcified leaflets . There was mild regurgitation. - Left atrium: The atrium was massively dilated. - Right atrium: The atrium was moderately dilated.   ASSESSMENT AND PLAN:  1. NICM: Most recent echocardiogram revealing reduced EF of 40% to 45%. Blood pressure continues to be elevated here in the office. I retracted and was found to be 155/78. The patient states that her blood pressure is normally much lower. I am going to check a BMET today and consider adding spironolactone to her medication regimen for better blood pressure control in the setting of reduced ejection fraction.  2.  Hypertension: She is tolerating higher dose of losartan along with metoprolol. She denies any dizziness or stomach upset with your medications. She is medically compliant. She is not always compliant with her diet concerning salty foods. We have reinforced need to reduce salt in her diet for better blood pressure control. She verbalizes understanding.  3. Atrial fibrillation: Heart rate is currently well controlled.  She remains on Coumadin and is followed by our [facility].  We will not make any changes at this time.  4.  Chronic diastolic CHF: She remains on Lasix.  No evidence of volume overload at this time.  Current medicines are reviewed at length with the patient today.  I have spent  25 minutes edicated to the care of this patient on the date of this encounter to include pre-visit review of records, assessment, management and diagnostic testing,with shared decision making.  Labs/ tests ordered today include: BMET Phill Myron. West Pugh, ANP, [facility]   04/15/2019 9:13 AM    [address] Office [phone] Fax [phone]  Notice: This dictation was prepared with Dragon dictation along with smaller phrase technology. Any transcriptional errors that result from this process are unintentional and may not be corrected upon review.

## 2019-04-15 ENCOUNTER — Other Ambulatory Visit: Payer: Self-pay

## 2019-04-15 ENCOUNTER — Encounter: Payer: Self-pay | Admitting: Adult Health

## 2019-04-15 ENCOUNTER — Ambulatory Visit (INDEPENDENT_AMBULATORY_CARE_PROVIDER_SITE_OTHER): Payer: Medicare Other | Admitting: Adult Health

## 2019-04-15 VITALS — BP 178/108 | HR 88 | Ht 66.0 in | Wt 272.2 lb

## 2019-04-15 DIAGNOSIS — I4811 Longstanding persistent atrial fibrillation: Secondary | ICD-10-CM | POA: Diagnosis not present

## 2019-04-15 DIAGNOSIS — I743 Embolism and thrombosis of arteries of the lower extremities: Secondary | ICD-10-CM | POA: Diagnosis not present

## 2019-04-15 DIAGNOSIS — I10 Essential (primary) hypertension: Secondary | ICD-10-CM

## 2019-04-15 DIAGNOSIS — I5032 Chronic diastolic (congestive) heart failure: Secondary | ICD-10-CM

## 2019-04-15 DIAGNOSIS — Z79899 Other long term (current) drug therapy: Secondary | ICD-10-CM | POA: Diagnosis not present

## 2019-04-15 LAB — BASIC METABOLIC PANEL
BUN/Creatinine Ratio: 13 (ref 12–28)
BUN: 12 mg/dL (ref 8–27)
CO2: 26 mmol/L (ref 20–29)
Calcium: 8.9 mg/dL (ref 8.7–10.3)
Chloride: 102 mmol/L (ref 96–106)
Creatinine, Ser: 0.92 mg/dL (ref 0.57–1.00)
GFR calc Af Amer: 71 mL/min/{1.73_m2} (ref >59)
GFR calc non Af Amer: 62 mL/min/{1.73_m2} (ref >59)
Glucose: 95 mg/dL (ref 65–99)
Potassium: 4.1 mmol/L (ref 3.5–5.2)
Sodium: 142 mmol/L (ref 134–144)

## 2019-04-15 MED ORDER — WARFARIN SODIUM 3 MG PO TABS: ORAL_TABLET | ORAL | 0 refills | Status: DC

## 2019-04-15 NOTE — Patient Instructions (Signed)
Medication Instructions:  Your physician recommends that you continue on your current medications as directed. Please refer to the Current Medication list given to you today.  *If you need a refill on your cardiac medications before your next appointment, please call your pharmacy*  Lab Work: Your physician recommends that you return for lab work today: [place]   If you have labs (blood work) drawn today and your tests are completely normal, you will receive your results only by: Marland Kitchen MyChart Message (if you have MyChart) OR . A paper copy in the mail If you have any lab test that is abnormal or we need to change your treatment, we will call you to review the results.  Testing/Procedures: NONE  Follow-Up: At [facility], you and your health needs are our priority.  As part of our continuing mission to provide you with exceptional heart care, we have created designated Provider Care Teams.  These Care Teams include your primary Cardiologist (physician) and Advanced Practice Providers (APPs -  Physician Assistants and Nurse Practitioners) who all work together to provide you with the care you need, when you need it.  Your next appointment:   6 month(s)  The format for your next appointment:   In Person  Provider:     Jory Sims, DNP, ANP

## 2019-04-23 DIAGNOSIS — H2513 Age-related nuclear cataract, bilateral: Secondary | ICD-10-CM | POA: Diagnosis not present

## 2019-04-23 DIAGNOSIS — H524 Presbyopia: Secondary | ICD-10-CM | POA: Diagnosis not present

## 2019-04-24 ENCOUNTER — Other Ambulatory Visit: Payer: Medicare Other

## 2019-05-03 ENCOUNTER — Ambulatory Visit (INDEPENDENT_AMBULATORY_CARE_PROVIDER_SITE_OTHER): Payer: Medicare Other | Admitting: *Deleted

## 2019-05-03 ENCOUNTER — Other Ambulatory Visit: Payer: Self-pay

## 2019-05-03 DIAGNOSIS — I4821 Permanent atrial fibrillation: Secondary | ICD-10-CM

## 2019-05-03 DIAGNOSIS — Z5181 Encounter for therapeutic drug level monitoring: Secondary | ICD-10-CM | POA: Diagnosis not present

## 2019-05-03 DIAGNOSIS — I743 Embolism and thrombosis of arteries of the lower extremities: Secondary | ICD-10-CM

## 2019-05-03 DIAGNOSIS — Z86711 Personal history of pulmonary embolism: Secondary | ICD-10-CM

## 2019-05-03 LAB — POCT INR: INR: 4.9 — AB (ref 2.0–3.0)

## 2019-05-03 NOTE — Patient Instructions (Signed)
Description   Hold today and tomorrow's dose then start taking 2 tablets daily except for 1.5 tablets on Sundays, Tuesdays, and Thursdays. Recheck in 2 weeks. Coumadin Clinic # [phone]

## 2019-05-04 IMAGING — CT CT HEAD CODE STROKE
4 of 8 series · 17 of 47 positions shown, 18 images · non-contrast
Comparison: Head CT without contrast 06/11/2010 and earlier.

CLINICAL DATA: Code stroke. 71-year-old female with right lower
extremity numbness at 4411 hours.

EXAM:
CT HEAD WITHOUT CONTRAST
TECHNIQUE: Contiguous axial images were obtained from the base of the skull
through the vertex without intravenous contrast.

[Series 2: head bone f_0.5 · axial · 0.42mm/px · z∈[-186,-46]mm · 8 of 85 slices shown]
[im 8/85  bone]
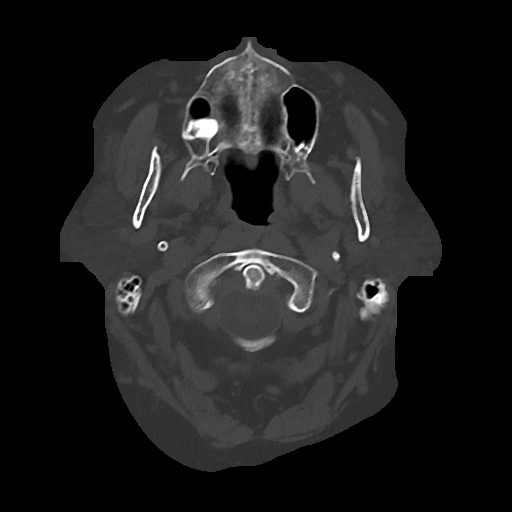
[im 22/85  bone]
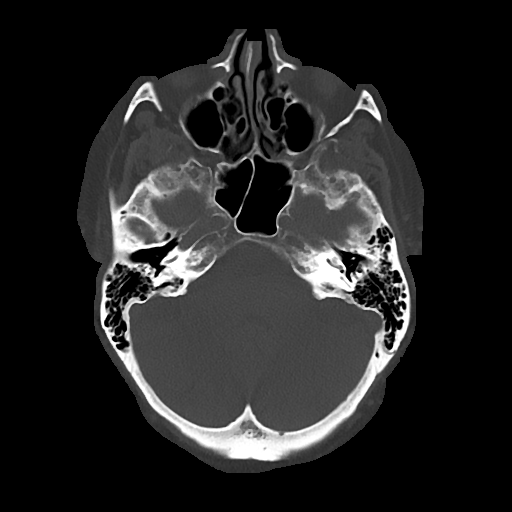
[im 29/85  bone]
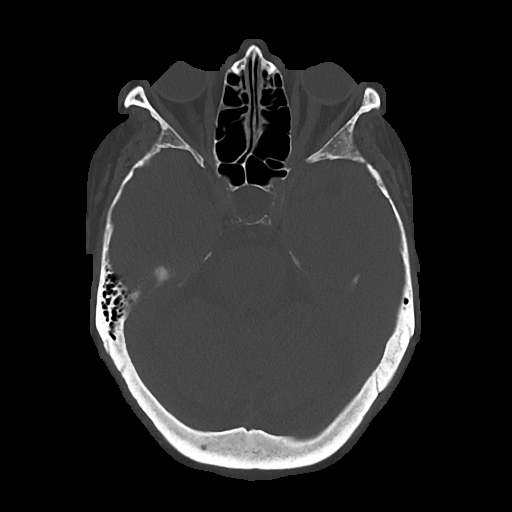
[im 36/85  bone]
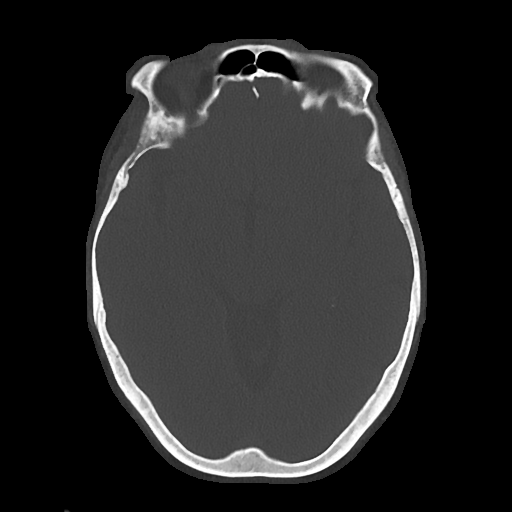
[im 50/85  bone]
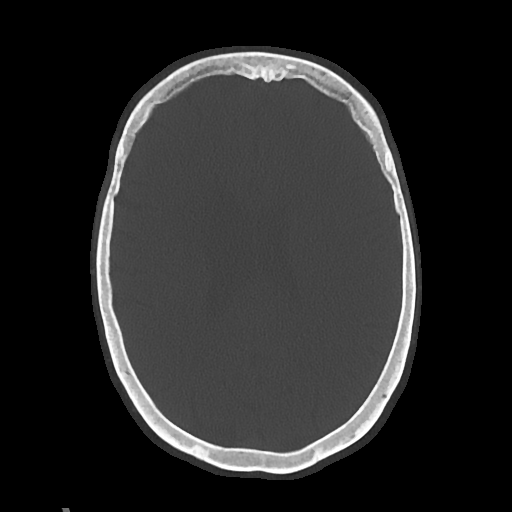
[im 57/85  bone]
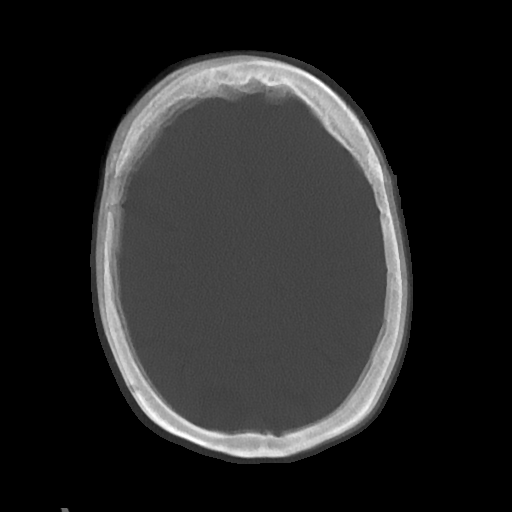
[im 64/85  bone]
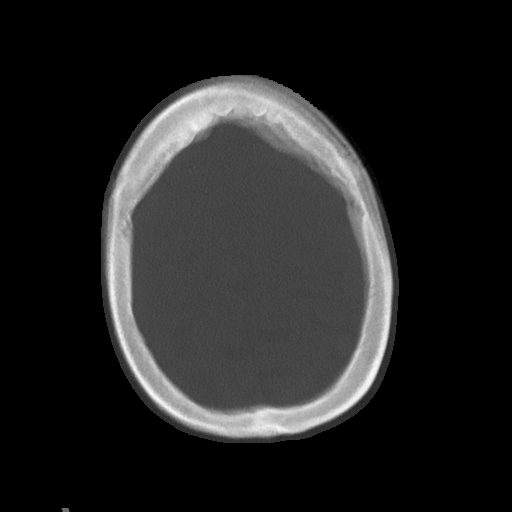
[im 78/85  bone]
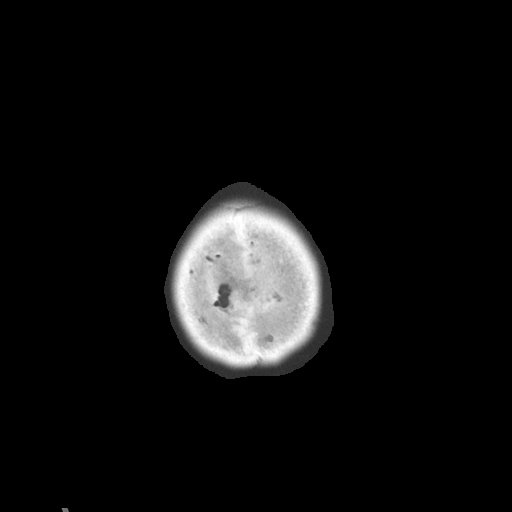

[Series 3: head wo f_0.5 · axial · 0.44mm/px · z∈[-170,-70]mm · 4 of 34 slices shown, 5 images]
[im 7/34  brain]
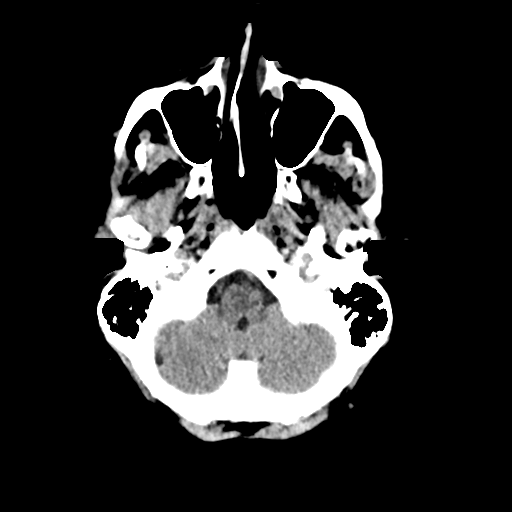
[im 7/34  bone]
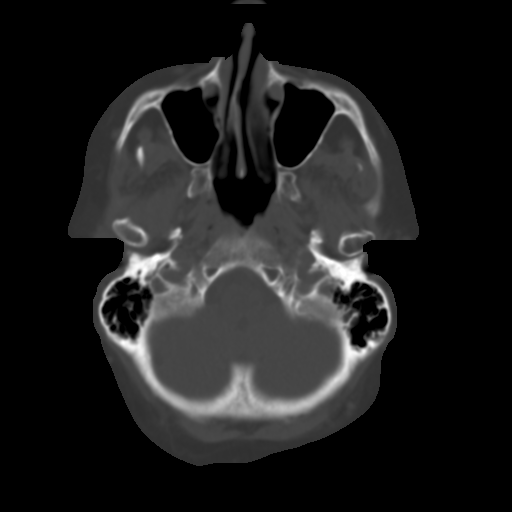
[im 14/34  brain]
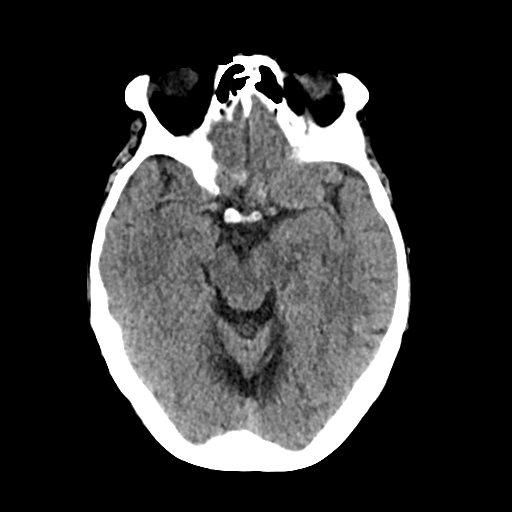
[im 20/34  brain]
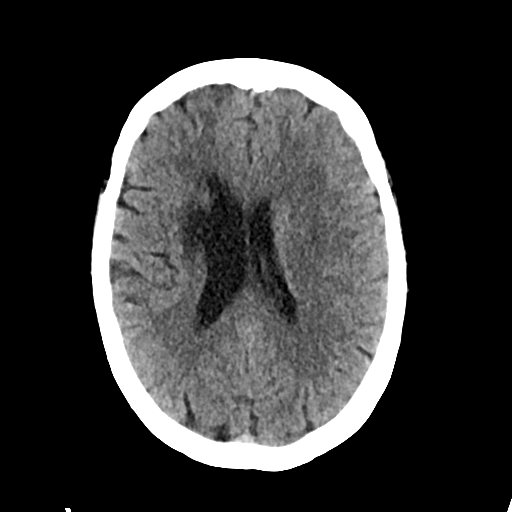
[im 27/34  brain]
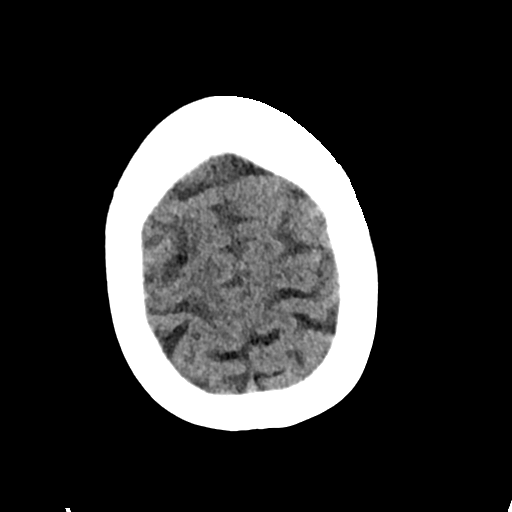

[Series 6: cor soft f_0.5 · coronal · 0.31mm/px · 3 of 67 slices shown]
[im 14/67  brain]
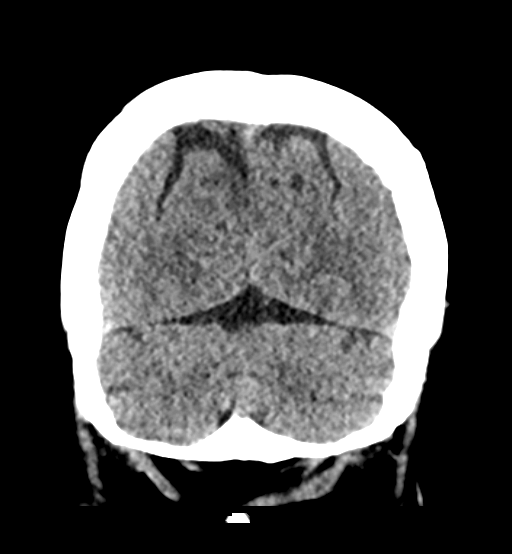
[im 27/67  brain]
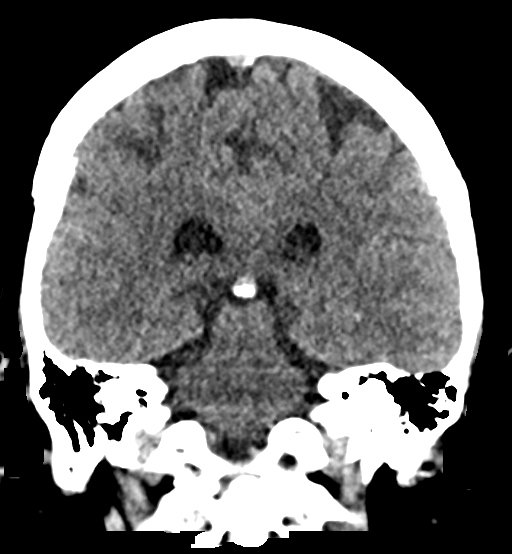
[im 40/67  brain]
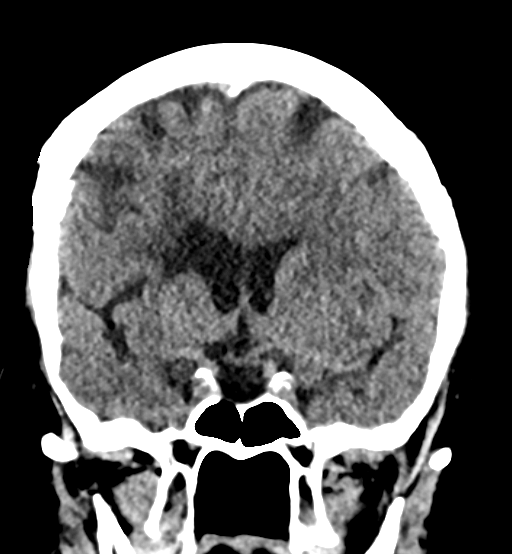

[Series 7: sag soft f_0.5 · sagittal · 0.33mm/px · 2 of 46 slices shown]
[im 16/46  brain]
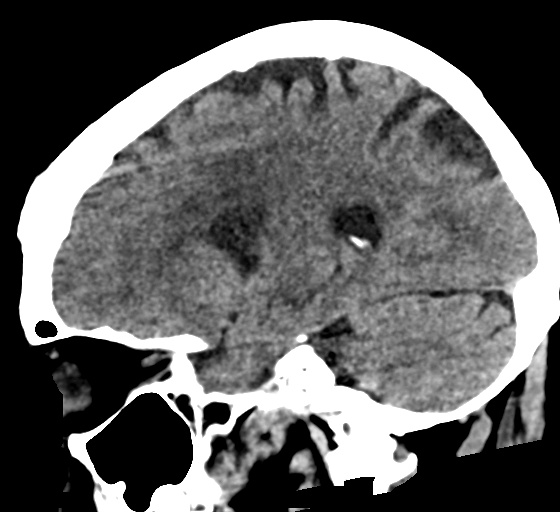
[im 31/46  brain]
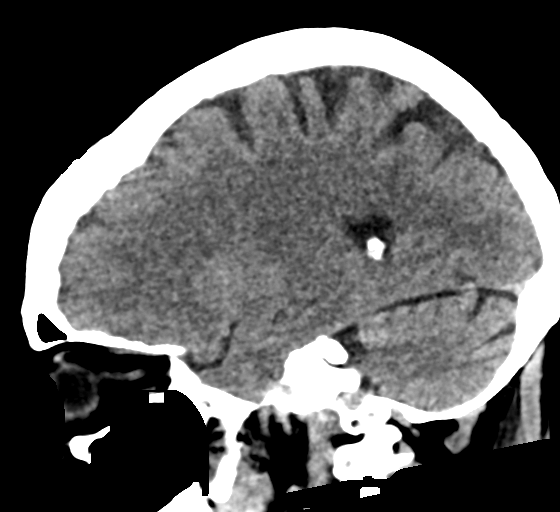

[17 of 47 positions shown; findings below may reference images not displayed]

FINDINGS: Brain: Sequelae of hemorrhagic right basal ganglia infarct that
occurred in 9629. Chronic encephalomalacia now at that site. Mild ex
vacuo enlargement of the right lateral ventricle. Small chronic
appearing infarct in the right cerebellar hemisphere.

Mild motion artifact. No midline shift, ventriculomegaly, mass
effect, evidence of mass lesion, intracranial hemorrhage or evidence
of cortically based acute infarction.

Vascular: Calcified atherosclerosis at the skull base. No suspicious
intracranial vascular hyperdensity.

Skull: No acute osseous abnormality identified.

Sinuses/Orbits: stable to improved sinus aeration since 9629.
Mastoids are clear.

Other: No acute orbit or scalp soft tissue finding.

ASPECTS (Alberta Stroke Program Early CT Score)

- Ganglionic level infarction (caudate, lentiform nuclei, internal
capsule, insula, M1-M3 cortex): 7

- Supraganglionic infarction (M4-M6 cortex): 3

Total score (0-10 with 10 being normal): 10
IMPRESSION: 1. No acute cortically based infarct or acute intracranial
hemorrhage.
2. ASPECTS is 10.
3. Chronic hemorrhagic infarct of the right basal ganglia. Small
chronic lacune in the right cerebellum.
4. The above was relayed via text pager to Dr. Longse Caloline on

## 2019-05-15 DIAGNOSIS — H25812 Combined forms of age-related cataract, left eye: Secondary | ICD-10-CM | POA: Diagnosis not present

## 2019-05-15 DIAGNOSIS — H2512 Age-related nuclear cataract, left eye: Secondary | ICD-10-CM | POA: Diagnosis not present

## 2019-05-19 ENCOUNTER — Ambulatory Visit: Payer: Medicare Other | Attending: Internal Medicine

## 2019-05-19 DIAGNOSIS — Z23 Encounter for immunization: Secondary | ICD-10-CM | POA: Insufficient documentation

## 2019-05-19 NOTE — Progress Notes (Signed)
   Covid-19 Vaccination Clinic  Name:  [name]    MRN: [id number] DOB: 1946/01/01  05/19/2019  [name] was observed post Covid-19 immunization for 15 minutes without incidence. She was provided with Vaccine Information Sheet and instruction to access the V-Safe system.   [name] was instructed to call 911 with any severe reactions post vaccine: Marland Kitchen Difficulty breathing  . Swelling of your face and throat  . A fast heartbeat  . A bad rash all over your body  . Dizziness and weakness    Immunizations Administered    Name Date Dose VIS Date Route   Pfizer COVID-19 Vaccine 05/19/2019 11:34 AM 0.3 mL 03/08/2019 Intramuscular   Manufacturer: [place]   Lot: [id number]   [place]: [id number]

## 2019-05-20 ENCOUNTER — Other Ambulatory Visit: Payer: Self-pay

## 2019-05-20 ENCOUNTER — Ambulatory Visit (INDEPENDENT_AMBULATORY_CARE_PROVIDER_SITE_OTHER): Payer: Medicare Other | Admitting: *Deleted

## 2019-05-20 DIAGNOSIS — I743 Embolism and thrombosis of arteries of the lower extremities: Secondary | ICD-10-CM | POA: Diagnosis not present

## 2019-05-20 DIAGNOSIS — Z86711 Personal history of pulmonary embolism: Secondary | ICD-10-CM

## 2019-05-20 DIAGNOSIS — I4821 Permanent atrial fibrillation: Secondary | ICD-10-CM | POA: Diagnosis not present

## 2019-05-20 DIAGNOSIS — Z5181 Encounter for therapeutic drug level monitoring: Secondary | ICD-10-CM | POA: Diagnosis not present

## 2019-05-20 LAB — POCT INR: INR: 2.2 (ref 2.0–3.0)

## 2019-05-20 NOTE — Patient Instructions (Signed)
Description   Take 2.5 tablets today and then continue taking 2 tablets daily except for 1.5 tablets on Sundays, Tuesdays, and Thursdays. Recheck in 2 weeks. Coumadin Clinic # [phone]

## 2019-06-03 ENCOUNTER — Ambulatory Visit (INDEPENDENT_AMBULATORY_CARE_PROVIDER_SITE_OTHER): Payer: Medicare Other | Admitting: *Deleted

## 2019-06-03 DIAGNOSIS — Z5181 Encounter for therapeutic drug level monitoring: Secondary | ICD-10-CM | POA: Diagnosis not present

## 2019-06-03 DIAGNOSIS — I4821 Permanent atrial fibrillation: Secondary | ICD-10-CM

## 2019-06-03 DIAGNOSIS — I743 Embolism and thrombosis of arteries of the lower extremities: Secondary | ICD-10-CM | POA: Diagnosis not present

## 2019-06-03 DIAGNOSIS — Z86711 Personal history of pulmonary embolism: Secondary | ICD-10-CM | POA: Diagnosis not present

## 2019-06-03 LAB — POCT INR: INR: 2.8 (ref 2.0–3.0)

## 2019-06-03 NOTE — Patient Instructions (Signed)
Description   Continue taking 2 tablets daily except for 1.5 tablets on Sundays, Tuesdays, and Thursdays. Recheck in 3 weeks. Coumadin Clinic # [phone]

## 2019-06-05 DIAGNOSIS — H25811 Combined forms of age-related cataract, right eye: Secondary | ICD-10-CM | POA: Diagnosis not present

## 2019-06-05 DIAGNOSIS — H2511 Age-related nuclear cataract, right eye: Secondary | ICD-10-CM | POA: Diagnosis not present

## 2019-06-10 ENCOUNTER — Ambulatory Visit: Payer: Medicare Other | Attending: Internal Medicine

## 2019-06-10 DIAGNOSIS — Z23 Encounter for immunization: Secondary | ICD-10-CM

## 2019-06-10 NOTE — Progress Notes (Signed)
   Covid-19 Vaccination Clinic  Name:  [name]    MRN: [id number] DOB: 06/24/1945  06/10/2019  [name] was observed post Covid-19 immunization for 15 minutes without incident. She was provided with Vaccine Information Sheet and instruction to access the V-Safe system.   [name] was instructed to call 911 with any severe reactions post vaccine: Marland Kitchen Difficulty breathing  . Swelling of face and throat  . A fast heartbeat  . A bad rash all over body  . Dizziness and weakness   Immunizations Administered    Name Date Dose VIS Date Route   Pfizer COVID-19 Vaccine 06/10/2019 10:48 AM 0.3 mL 03/08/2019 Intramuscular   Manufacturer: [place]   Lot: [id number]   [place]: [id number]

## 2019-06-24 ENCOUNTER — Other Ambulatory Visit: Payer: Self-pay

## 2019-06-24 ENCOUNTER — Ambulatory Visit (INDEPENDENT_AMBULATORY_CARE_PROVIDER_SITE_OTHER): Payer: Medicare Other | Admitting: Pharmacist

## 2019-06-24 DIAGNOSIS — I743 Embolism and thrombosis of arteries of the lower extremities: Secondary | ICD-10-CM

## 2019-06-24 DIAGNOSIS — Z86711 Personal history of pulmonary embolism: Secondary | ICD-10-CM

## 2019-06-24 DIAGNOSIS — Z5181 Encounter for therapeutic drug level monitoring: Secondary | ICD-10-CM | POA: Diagnosis not present

## 2019-06-24 DIAGNOSIS — I4821 Permanent atrial fibrillation: Secondary | ICD-10-CM | POA: Diagnosis not present

## 2019-06-24 LAB — POCT INR: INR: 2.9 (ref 2.0–3.0)

## 2019-06-24 NOTE — Patient Instructions (Signed)
Description   Continue taking 2 tablets daily except for 1.5 tablets on Sundays, Tuesdays, and Thursdays. Recheck in 4 weeks. Coumadin Clinic # [phone]

## 2019-07-22 ENCOUNTER — Other Ambulatory Visit: Payer: Self-pay

## 2019-07-22 ENCOUNTER — Ambulatory Visit (INDEPENDENT_AMBULATORY_CARE_PROVIDER_SITE_OTHER): Payer: Medicare Other | Admitting: *Deleted

## 2019-07-22 DIAGNOSIS — I4821 Permanent atrial fibrillation: Secondary | ICD-10-CM

## 2019-07-22 DIAGNOSIS — Z86711 Personal history of pulmonary embolism: Secondary | ICD-10-CM | POA: Diagnosis not present

## 2019-07-22 DIAGNOSIS — I743 Embolism and thrombosis of arteries of the lower extremities: Secondary | ICD-10-CM

## 2019-07-22 DIAGNOSIS — Z5181 Encounter for therapeutic drug level monitoring: Secondary | ICD-10-CM

## 2019-07-22 LAB — POCT INR: INR: 4.2 — AB (ref 2.0–3.0)

## 2019-07-22 NOTE — Patient Instructions (Signed)
Description   Hold today and then continue taking 2 tablets daily except for 1.5 tablets on Sundays, Tuesdays, and Thursdays. Recheck in 3 weeks. Coumadin Clinic # [phone]

## 2019-08-05 ENCOUNTER — Other Ambulatory Visit: Payer: Self-pay | Admitting: Cardiology

## 2019-08-12 ENCOUNTER — Other Ambulatory Visit: Payer: Self-pay

## 2019-08-12 ENCOUNTER — Ambulatory Visit (INDEPENDENT_AMBULATORY_CARE_PROVIDER_SITE_OTHER): Payer: Medicare Other | Admitting: *Deleted

## 2019-08-12 DIAGNOSIS — Z86711 Personal history of pulmonary embolism: Secondary | ICD-10-CM

## 2019-08-12 DIAGNOSIS — Z5181 Encounter for therapeutic drug level monitoring: Secondary | ICD-10-CM | POA: Diagnosis not present

## 2019-08-12 DIAGNOSIS — I743 Embolism and thrombosis of arteries of the lower extremities: Secondary | ICD-10-CM | POA: Diagnosis not present

## 2019-08-12 DIAGNOSIS — I4821 Permanent atrial fibrillation: Secondary | ICD-10-CM

## 2019-08-12 DIAGNOSIS — Z8673 Personal history of transient ischemic attack (TIA), and cerebral infarction without residual deficits: Secondary | ICD-10-CM

## 2019-08-12 LAB — POCT INR: INR: 3.6 — AB (ref 2.0–3.0)

## 2019-08-12 NOTE — Patient Instructions (Signed)
Description   Take 1 tablet today and then start taking 1.5 tablets daily except for 2 tablets on Monday, Wednesday and Fridays.  Recheck in 3 weeks. Coumadin Clinic # [phone]

## 2019-08-15 ENCOUNTER — Other Ambulatory Visit: Payer: Self-pay | Admitting: Cardiology

## 2019-08-20 ENCOUNTER — Other Ambulatory Visit: Payer: Self-pay

## 2019-08-20 MED ORDER — METOPROLOL TARTRATE 25 MG PO TABS: 25.0000 mg | ORAL_TABLET | Freq: Two times a day (BID) | ORAL | 1 refills | Status: DC

## 2019-08-21 ENCOUNTER — Other Ambulatory Visit: Payer: Self-pay | Admitting: Cardiology

## 2019-09-02 ENCOUNTER — Ambulatory Visit (INDEPENDENT_AMBULATORY_CARE_PROVIDER_SITE_OTHER): Payer: Medicare Other

## 2019-09-02 ENCOUNTER — Other Ambulatory Visit: Payer: Self-pay

## 2019-09-02 DIAGNOSIS — I743 Embolism and thrombosis of arteries of the lower extremities: Secondary | ICD-10-CM | POA: Diagnosis not present

## 2019-09-02 DIAGNOSIS — Z5181 Encounter for therapeutic drug level monitoring: Secondary | ICD-10-CM

## 2019-09-02 DIAGNOSIS — I4821 Permanent atrial fibrillation: Secondary | ICD-10-CM

## 2019-09-02 DIAGNOSIS — Z86711 Personal history of pulmonary embolism: Secondary | ICD-10-CM

## 2019-09-02 LAB — POCT INR: INR: 3 (ref 2.0–3.0)

## 2019-09-02 NOTE — Patient Instructions (Signed)
Description   Continue on same dosage 1.5 tablets daily except for 2 tablets on Mondays, Wednesdays and Fridays.  Recheck in 4 weeks. Coumadin Clinic # [phone]

## 2019-09-18 ENCOUNTER — Encounter (HOSPITAL_COMMUNITY): Payer: Self-pay | Admitting: Emergency Medicine

## 2019-09-18 ENCOUNTER — Other Ambulatory Visit: Payer: Self-pay

## 2019-09-18 ENCOUNTER — Emergency Department (HOSPITAL_COMMUNITY)
Admission: EM | Admit: 2019-09-18 | Discharge: 2019-09-19 | Disposition: A | Discharge status: Home / Self Care | Payer: Medicare Other | Attending: Emergency Medicine | Admitting: Emergency Medicine

## 2019-09-18 DIAGNOSIS — Z7982 Long term (current) use of aspirin: Secondary | ICD-10-CM | POA: Diagnosis not present

## 2019-09-18 DIAGNOSIS — S79911A Unspecified injury of right hip, initial encounter: Secondary | ICD-10-CM | POA: Diagnosis present

## 2019-09-18 DIAGNOSIS — Y998 Other external cause status: Secondary | ICD-10-CM | POA: Diagnosis not present

## 2019-09-18 DIAGNOSIS — I10 Essential (primary) hypertension: Secondary | ICD-10-CM | POA: Insufficient documentation

## 2019-09-18 DIAGNOSIS — Z8673 Personal history of transient ischemic attack (TIA), and cerebral infarction without residual deficits: Secondary | ICD-10-CM | POA: Insufficient documentation

## 2019-09-18 DIAGNOSIS — Y929 Unspecified place or not applicable: Secondary | ICD-10-CM | POA: Diagnosis not present

## 2019-09-18 DIAGNOSIS — S76011A Strain of muscle, fascia and tendon of right hip, initial encounter: Secondary | ICD-10-CM | POA: Insufficient documentation

## 2019-09-18 DIAGNOSIS — X58XXXA Exposure to other specified factors, initial encounter: Secondary | ICD-10-CM | POA: Diagnosis not present

## 2019-09-18 DIAGNOSIS — Z79899 Other long term (current) drug therapy: Secondary | ICD-10-CM | POA: Diagnosis not present

## 2019-09-18 DIAGNOSIS — Y939 Activity, unspecified: Secondary | ICD-10-CM | POA: Diagnosis not present

## 2019-09-18 DIAGNOSIS — Z7901 Long term (current) use of anticoagulants: Secondary | ICD-10-CM | POA: Insufficient documentation

## 2019-09-18 LAB — CBC
HCT: 38.2 % (ref 36.0–46.0)
Hemoglobin: 12 g/dL (ref 12.0–15.0)
MCH: 32.5 pg (ref 26.0–34.0)
MCHC: 31.4 g/dL (ref 30.0–36.0)
MCV: 103.5 fL — ABNORMAL HIGH (ref 80.0–100.0)
Platelets: 186 10*3/uL (ref 150–400)
RBC: 3.69 MIL/uL — ABNORMAL LOW (ref 3.87–5.11)
RDW: 13.2 % (ref 11.5–15.5)
WBC: 8.2 10*3/uL (ref 4.0–10.5)
nRBC: 0 % (ref 0.0–0.2)

## 2019-09-18 LAB — BASIC METABOLIC PANEL
Anion gap: 11 (ref 5–15)
BUN: 10 mg/dL (ref 8–23)
CO2: 25 mmol/L (ref 22–32)
Calcium: 8.6 mg/dL — ABNORMAL LOW (ref 8.9–10.3)
Chloride: 101 mmol/L (ref 98–111)
Creatinine, Ser: 0.91 mg/dL (ref 0.44–1.00)
GFR calc Af Amer: >60 mL/min (ref >60)
GFR calc non Af Amer: >60 mL/min (ref >60)
Glucose, Bld: 127 mg/dL — ABNORMAL HIGH (ref 70–99)
Potassium: 4 mmol/L (ref 3.5–5.1)
Sodium: 137 mmol/L (ref 135–145)

## 2019-09-18 MED ORDER — SODIUM CHLORIDE 0.9% FLUSH: 3.0000 mL | Freq: Once | INTRAVENOUS | Status: DC

## 2019-09-18 NOTE — ED Triage Notes (Signed)
Patient arrives to ED with complaints of right leg pain. Patient states the pain occurs where her right buttocks meets her leg. Patient states she has significant clotting hx with multiple DVTs and IVC filer placed. Patient states the pain has been there since Monday night and has not gotten better.

## 2019-09-19 ENCOUNTER — Emergency Department (HOSPITAL_COMMUNITY): Payer: Medicare Other

## 2019-09-19 ENCOUNTER — Encounter (HOSPITAL_COMMUNITY): Payer: Self-pay | Admitting: Emergency Medicine

## 2019-09-19 DIAGNOSIS — S76011A Strain of muscle, fascia and tendon of right hip, initial encounter: Secondary | ICD-10-CM | POA: Diagnosis not present

## 2019-09-19 LAB — PROTIME-INR
INR: 4.3 (ref 0.8–1.2)
Prothrombin Time: 39.7 seconds — ABNORMAL HIGH (ref 11.4–15.2)

## 2019-09-19 MED ORDER — LIDOCAINE 5 % EX PTCH: 1.0000 | MEDICATED_PATCH | CUTANEOUS | 0 refills | Status: DC

## 2019-09-19 NOTE — ED Provider Notes (Signed)
[facility] [place] [facility] EMERGENCY DEPARTMENT Provider Note   CSN: [id number] Arrival date & time: 09/18/19  1438     History Chief Complaint  Patient presents with  . Leg Pain    [name] is a 74 y.o. female history of lupus anticoagulant disorder, blood clots, A. fib, obesity, NICM.  Patient presents today for right gluteal pain onset 2 days ago.  Describes pain as new constant pain throbbing nonradiating worsened with palpation and laying on the right side improved with rest.  Denies fall/injury or similar pain in the past.  Denies fever/chills, abdominal pain, chest pain, shortness of breath, dysuria/hematuria, bowel/bladder incontinence, saddle or paresthesias, urinary retention, numbness/weakness, tingling or any additional concerns.  HPI     Past Medical History:  Diagnosis Date  . (HFpEF) heart failure with preserved ejection fraction ([place])    a. 06/2008 Echo: EF 55-60%, mild LVH, triv AI, mild to mod MS, mod to sev dil LA, mildly dil RA.  Marland Kitchen Acute right MCA stroke ([place])    a. 07/02/4257 Embolic stroke treated with TPA and thrombectomy with hemorrhagic transformation; Carotids 3/12 negative for ICA stenosis.  . Bilateral Pulmonary Emboli    a. 08/2005 - s/p IVC filter.  . Depression   . Embolus of femoral artery ([place])    a. 06/2008 s/p bilateral embolectomies.  . History of DVT (deep vein thrombosis)    a. s/p IVC filter.  Marland Kitchen HIT (heparin-induced thrombocytopenia) ([place])   . HTN (hypertension)   . Lupus anticoagulant disorder ([place])   . Obesity, unspecified   . Permanent atrial fibrillation ([place])    a. On chronic Coumadin - INRs followed by PCP; b. CHA2DS2VASc = 5.  . Popliteal artery embolism, right ([place])    a. 01/2017 in the setting of subRx INR-->s/p embolectomy.    Patient Active Problem List   Diagnosis Date Noted  . Essential hypertension 06/27/2017  . History of lupus anticoagulant disorder 03/31/2017  . NICM (nonischemic cardiomyopathy) ([place])  03/31/2017  . Streptococcal bacteremia   . History of MRSA infection   . Wound infection 03/24/2017  . Cellulitis of right lower extremity 03/24/2017  . Sepsis due to cellulitis ([place]) 03/04/2017  . Popliteal artery embolus ([place]) 02/16/2017  . Encounter for therapeutic drug monitoring 06/11/2013  . History of CVA (cerebrovascular accident) 11/18/2010  . Long term (current) use of anticoagulants 08/09/2010  . Obesity, unspecified 08/14/2008  . Permanent atrial fibrillation ([place]) 03/07/2008  . History of pulmonary embolism 08/30/2005    Past Surgical History:  Procedure Laterality Date  . distal radius fracture. (12,/16/2010)    . EMBOLECTOMY Right 02/15/2017   Procedure: EMBOLECTOMY RIGHT LEG;  Surgeon: Elam Dutch, MD;  Location: [facility] OR;  Service: Vascular;  Laterality: Right;  . I & D EXTREMITY Right 03/05/2017   Procedure: IRRIGATION AND DEBRIDEMENT RIGHT LEG HEMATOMA EVACUATION;  Surgeon: Elam Dutch, MD;  Location: [facility];  Service: Vascular;  Laterality: Right;  . IVC Placement 08/30/2005    . Open reduction and internal fixation of left intra-articular       OB History   No obstetric history on file.     Family History  Problem Relation Age of Onset  . Coronary artery disease Other   . Diabetes Other     Social History   Tobacco Use  . Smoking status: Never Smoker  . Smokeless tobacco: Never Used  Vaping Use  . Vaping Use: Never used  Substance Use Topics  . Alcohol use: No  .  Drug use: No    Home Medications Prior to Admission medications   Medication Sig Start Date End Date Taking? Authorizing Provider  acetaminophen (TYLENOL) 500 MG tablet Take 1,000 mg by mouth every 6 (six) hours as needed for mild pain.   Yes [provider]  Ascorbic Acid (VITA-C PO) Take 1 tablet by mouth daily.   Yes [provider]  aspirin 81 MG tablet Take 81 mg by mouth daily.     Yes [provider]  cetirizine (ZYRTEC) 10 MG tablet Take 10  mg by mouth daily as needed for allergies.   Yes [provider]  Cholecalciferol (VITAMIN D3) 125 MCG (5000 UT) CAPS Take 5,000 Units by mouth daily.   Yes [provider]  FLUoxetine (PROZAC) 20 MG capsule Take 20 mg by mouth daily. 04/12/17  Yes [provider]  furosemide (LASIX) 40 MG tablet Take 1 tablet (40 mg total) by mouth daily. 03/06/19  Yes Lendon Colonel, NP  losartan (COZAAR) 100 MG tablet Take 1 tablet (100 mg total) by mouth daily. 03/06/19 09/19/19 Yes Lendon Colonel, NP  metoprolol tartrate (LOPRESSOR) 25 MG tablet Take 1 tablet by mouth twice daily Patient taking differently: Take 25 mg by mouth 2 (two) times daily.  08/21/19  Yes Hochrein, Jeneen Rinks, MD  potassium chloride (K-DUR) 10 MEQ tablet TAKE 1 TABLET BY MOUTH ONCE DAILY Patient taking differently: Take 10 mEq by mouth daily.  04/23/18  Yes Kilroy, Luke K, PA-C  warfarin (COUMADIN) 3 MG tablet TAKE 1&1/2-2 TABLETS BY MOUTH ONCE DAILY AS DIRECTED BY COUMADIN CLINIC Patient taking differently: Take 4.5-6 mg by mouth See admin instructions. Take 1&1/2 tablets on Tues, Thurs, and Saturday, and take 2 tablets on all other days (Sun, Mon, Wed, Fri) 08/05/19  Yes Hochrein, Jeneen Rinks, MD  lidocaine (LIDODERM) 5 % Place 1 patch onto the skin daily. Remove & Discard patch within 12 hours or as directed by MD 09/19/19   Deliah Boston, PA-C    Allergies    Heparin and Other  Review of Systems   Review of Systems Ten systems are reviewed and are negative for acute change except as noted in the HPI  Physical Exam Updated Vital Signs BP (!) 157/87   Pulse 87   Temp 97.9 F (36.6 C) (Oral)   Resp 18   Ht 5\' 6"  (1.676 m)   Wt 99.8 kg   SpO2 98%   BMI 35.51 kg/m   Physical Exam Constitutional:      General: She is not in acute distress.    Appearance: Normal appearance. She is well-developed. She is not ill-appearing or diaphoretic.  HENT:     Head: Normocephalic and atraumatic.  Eyes:      General: Vision grossly intact. Gaze aligned appropriately.     Pupils: Pupils are equal, round, and reactive to light.  Neck:     Trachea: Trachea and phonation normal.  Cardiovascular:     Rate and Rhythm: Normal rate. Rhythm irregularly irregular.     Pulses:          Dorsalis pedis pulses are 2+ on the right side and 2+ on the left side.     Comments: Bilateral lower extremity symmetric in appearance Pulmonary:     Effort: Pulmonary effort is normal. No respiratory distress.  Abdominal:     General: There is no distension.     Palpations: Abdomen is soft.     Tenderness: There is no abdominal tenderness. There is  no guarding or rebound.  Musculoskeletal:        General: Normal range of motion.     Cervical back: Normal range of motion.       Legs:     Comments: Tenderness of the right gluteal muscles without overlying skin change. - No midline C/T/L spinal tenderness to palpation, no paraspinal muscle tenderness, no deformity, crepitus, or step-off noted. No sign of injury to the neck or back.   Skin:    General: Skin is warm and dry.  Neurological:     Mental Status: She is alert.     GCS: GCS eye subscore is 4. GCS verbal subscore is 5. GCS motor subscore is 6.     Comments: Speech is clear and goal oriented, follows commands Major Cranial nerves without deficit, no facial droop Moves extremities without ataxia, coordination intact  Psychiatric:        Behavior: Behavior normal.     ED Results / Procedures / Treatments   Labs (all labs ordered are listed, but only abnormal results are displayed) Labs Reviewed  BASIC METABOLIC PANEL - Abnormal; Notable for the following components:      Result Value   Glucose, Bld 127 (*)    Calcium 8.6 (*)    All other components within normal limits  CBC - Abnormal; Notable for the following components:   RBC 3.69 (*)    MCV 103.5 (*)    All other components within normal limits  PROTIME-INR - Abnormal; Notable for the  following components:   Prothrombin Time 39.7 (*)    INR 4.3 (*)    All other components within normal limits  CBG MONITORING, ED    EKG None  Radiology DG Hip Unilat W or Wo Pelvis 2-3 Views Right  Result Date: 09/19/2019 CLINICAL DATA:  Right gluteal pain EXAM: DG HIP (WITH OR WITHOUT PELVIS) 2-3V RIGHT COMPARISON:  CT runoff 02/15/2017 FINDINGS: The osseous structures appear diffusely demineralized which may limit detection of small or nondisplaced fractures. Bones of the pelvis appear intact and congruent. The proximal femora also appear intact and normally located within the acetabula. There are moderate degenerative changes in the lumbosacral spine with additional degenerative features in the SI joints and symphysis pubis. Moderate degenerative changes in both hips as well with periarticular spurring. Surgical clips present over the bilateral groins may reflect prior revascularization. Inferior caval filter is noted. Few phleboliths in the pelvis. Coarse calcification towards the left hemipelvis may reflect a calcified fibroid. IMPRESSION: 1. No acute osseous abnormality. 2. Moderate degenerative changes in the lower lumbar spine, hips and pelvis. Electronically Signed   By: Lovena Le M.D.   On: 09/19/2019 06:23    Procedures Procedures (including critical care time)  Medications Ordered in ED Medications  sodium chloride flush (NS) 0.9 % injection 3 mL (has no administration in time range)    ED Course  I have reviewed the triage vital signs and the nursing notes.  Pertinent labs & imaging results that were available during my care of the patient were reviewed by me and considered in my medical decision making (see chart for details).    MDM Rules/Calculators/A&P                          Additional History Obtained: 1. Nursing notes from this visit. Family, patient's daughter at bedside - I reviewed and interpreted labs which include: CBC which shows no leukocytosis to  suggest infection and  no evidence of anemia. BMP shows no emergent electrolyte derangement, evidence of acute kidney injury or gap. PT/INR shows elevated INR at 4.3, supratherapeutic.  DG pelvis w/ R Hip:  IMPRESSION:  1. No acute osseous abnormality.  2. Moderate degenerative changes in the lower lumbar spine, hips and  pelvis.  I personally reviewed patient's x-ray and agree with radiologist interpretation above. - Patient symptoms today are consistently reproducible to palpation of the right gluteal muscles, no overlying skin change.  Suspect possible muscle strain as etiology of her symptoms today.  Given her supratherapeutic INR low suspicion for clot.  No evidence of acute trauma, neurovascular compromise, cellulitis, septic arthritis, compartment syndrome or other emergent pathologies at this time.  Additionally patient has no back pain or cauda equina-like symptoms, does not appear to sciatic in nature.  Patient has no urinary symptoms to suggest UTI and no flank pain.  Plan of care is to provide patient with Lidoderm patches and encourage PCP follow-up.  At this time there does not appear to be any evidence of an acute emergency medical condition and the patient appears stable for discharge with appropriate outpatient follow up. Diagnosis was discussed with patient who verbalizes understanding of care plan and is agreeable to discharge. I have discussed return precautions with patient and daughter who verbalizes understanding. Patient encouraged to follow-up with their PCP. All questions answered.  Patient's case discussed with Dr. Leonette Monarch who agrees with plan to discharge with follow-up.   Note: Portions of this report may have been transcribed using voice recognition software. Every effort was made to ensure accuracy; however, inadvertent computerized transcription errors may still be present. Final Clinical Impression(s) / ED Diagnoses Final diagnoses:  Muscle strain of right gluteal  region, initial encounter    Rx / DC Orders ED Discharge Orders         Ordered    lidocaine (LIDODERM) 5 %  Every 24 hours     Discontinue  Reprint     09/19/19 0636           Deliah Boston, PA-C 09/19/19 1610    Fatima Blank, MD 09/20/19 [phone]

## 2019-09-19 NOTE — ED Notes (Signed)
Pt transported to xray 

## 2019-09-19 NOTE — Discharge Instructions (Signed)
At this time there does not appear to be the presence of an emergent medical condition, however there is always the potential for conditions to change. Please read and follow the below instructions.  Please return to the Emergency Department immediately for any new or worsening symptoms. Please be sure to follow up with your Primary Care Provider within one week regarding your visit today; please call their office to schedule an appointment even if you are feeling better for a follow-up visit. Your INR was 4.3 today which is high.  Have it rechecked by your primary care doctor at your follow-up visit next week. You may use the Lidoderm patch as prescribed to help with your symptoms.  Lidoderm may be expensive so you may speak with your pharmacist about finding over-the-counter medications that work similarly.  Get help right away if: You have any of these problems in your injured area: You have numbness. You have tingling. You lose a lot of strength. You have fever or chills You have swelling or color change of the lower leg You have chest pain or trouble breathing You have any new/concerning or worsening of symptoms.  Please read the additional information packets attached to your discharge summary.  Do not take your medicine if  develop an itchy rash, swelling in your mouth or lips, or difficulty breathing; call 911 and seek immediate emergency medical attention if this occurs.  You may review your lab tests and imaging results in their entirety on your MyChart account.  Please discuss all results of fully with your primary care provider and other specialist at your follow-up visit.  Note: Portions of this text may have been transcribed using voice recognition software. Every effort was made to ensure accuracy; however, inadvertent computerized transcription errors may still be present.

## 2019-10-02 NOTE — Progress Notes (Unsigned)
{Choose 1 Note Type (Telehealth Visit or Telephone Visit):[phone]}   Date:  10/02/2019   ID:  [name], DOB 1945-05-21, MRN [id number]  {Patient Location:[phone]::"Home"} {Provider Location:[phone]::"Home"}  PCP:  Josetta Huddle, MD  Cardiologist: Dr. Percival Spanish  Electrophysiologist:  None   Evaluation Performed:  {Choose Visit Type:[phone]::"Follow-Up Visit"}  Chief Complaint:  ***  History of Present Illness:    [name] is a 74 y.o. female with the patient for ongoing assessment and management of atrial fibrillation, with other history to include DVTs and bilateral PEs, patient has an IVC filter.  She has also had bilateral femoral artery embolectomy on 06/2008, history of embolic CVA in 4098 requiring TPA with subsequent hemorrhage conversion.  She also was found to have a right lower extremity popliteal embolism in the setting of subtherapeutic INR of 1.43 requiring surgical intervention.Post surgery she developed a wound infection requiring surgery again on 03/05/2017 and a wound VAC was placed.    The patient also has a history of lupus anticoagulation disorder, obesity, nonischemic cardiomyopathy with EF of 40% to 45%.  The patient was taking on anticoagulation with a CHADS VASC Score of 5 , on coumadin, followed by our [facility] , coumadin clinic.   She was last seen in person in the office on 04/15/2019 presents blood pressure after medication changes which were completed at the prior office visit in which I increased her losartan to 100 mg daily from 50 mg daily due to hypertension.  At that time her blood pressure had improved but not optimal.  The patient stated that her blood pressures generally much lower at home.  I will consider adding spironolactone to her medication regimen for better blood pressure control if necessary.  She was reinforced on low-sodium diet.  The patient {does/does not:200015} have symptoms concerning for COVID-19 infection  (fever, chills, cough, or new shortness of breath).    Past Medical History:  Diagnosis Date  . (HFpEF) heart failure with preserved ejection fraction ([place])    a. 06/2008 Echo: EF 55-60%, mild LVH, triv AI, mild to mod MS, mod to sev dil LA, mildly dil RA.  Marland Kitchen Acute right MCA stroke ([place])    a. 03/28/9145 Embolic stroke treated with TPA and thrombectomy with hemorrhagic transformation; Carotids 3/12 negative for ICA stenosis.  . Bilateral Pulmonary Emboli    a. 08/2005 - s/p IVC filter.  . Depression   . Embolus of femoral artery ([place])    a. 06/2008 s/p bilateral embolectomies.  . History of DVT (deep vein thrombosis)    a. s/p IVC filter.  Marland Kitchen HIT (heparin-induced thrombocytopenia) ([place])   . HTN (hypertension)   . Lupus anticoagulant disorder ([place])   . Obesity, unspecified   . Permanent atrial fibrillation ([place])    a. On chronic Coumadin - INRs followed by PCP; b. CHA2DS2VASc = 5.  . Popliteal artery embolism, right ([place])    a. 01/2017 in the setting of subRx INR-->s/p embolectomy.   Past Surgical History:  Procedure Laterality Date  . distal radius fracture. (12,/16/2010)    . EMBOLECTOMY Right 02/15/2017   Procedure: EMBOLECTOMY RIGHT LEG;  Surgeon: Elam Dutch, MD;  Location[facility] OR;  Service: Vascular;  Laterality: Right;  . I & D EXTREMITY Right 03/05/2017   Procedure: IRRIGATION AND DEBRIDEMENT RIGHT LEG HEMATOMA EVACUATION;  Surgeon: Elam Dutch, MD;  Location: [facility];  Service: Vascular;  Laterality: Right;  . IVC Placement 08/30/2005    . Open reduction and internal fixation  of left intra-articular       No outpatient medications have been marked as taking for the 10/03/19 encounter (Appointment) with Lendon Colonel, NP.     Allergies:   Heparin and Other   Social History   Tobacco Use  . Smoking status: Never Smoker  . Smokeless tobacco: Never Used  Vaping Use  . Vaping Use: Never used  Substance Use Topics  . Alcohol use: No  . Drug use: No      Family Hx: The patient's family history includes Coronary artery disease in an other family member; Diabetes in an other family member.  ROS:   Please see the history of present illness.    *** All other systems reviewed and are negative.   Prior CV studies:   The following studies were reviewed today: Echocardiogram 03/26/2017 Left ventricle: The cavity size was normal. Systolic function was mildly to moderately reduced. The estimated ejection fraction was in the range of 40% to 45%. Regional wall motion abnormalities cannot be excluded. - Ventricular septum: Septal motion showed abnormal function and dyssynergy. These changes are consistent with RV-LV interaction. - Mitral valve: Mildly calcified annulus. Mildly calcified leaflets . There was mild regurgitation. - Left atrium: The atrium was massively dilated. - Right atrium: The atrium was moderately dilated  Labs/Other Tests and Data Reviewed:    EKG:  {EKG/Telemetry Strips Reviewed:[phone]}  Recent Labs: 09/18/2019: BUN 10; Creatinine, Ser 0.91; Hemoglobin 12.0; Platelets 186; Potassium 4.0; Sodium 137   Recent Lipid Panel Lab Results  Component Value Date/Time   CHOL  06/04/2010 03:59 AM    139        ATP III CLASSIFICATION:  <200     mg/dL   Desirable  200-239  mg/dL   Borderline High  >=240    mg/dL   High          TRIG 71 06/04/2010 03:59 AM   HDL 33 (L) 06/04/2010 03:59 AM   CHOLHDL 4.2 06/04/2010 03:59 AM   LDLCALC  06/04/2010 03:59 AM    92        Total Cholesterol/HDL:CHD Risk Coronary Heart Disease Risk Table                     Men   Women  1/2 Average Risk   3.4   3.3  Average Risk       5.0   4.4  2 X Average Risk   9.6   7.1  3 X Average Risk  23.4   11.0        Use the calculated Patient Ratio above and the CHD Risk Table to determine the patient's CHD Risk.        ATP III CLASSIFICATION (LDL):  <100     mg/dL   Optimal  100-129  mg/dL   Near or Above                     Optimal  130-159  mg/dL   Borderline  160-189  mg/dL   High  >190     mg/dL   Very High    Wt Readings from Last 3 Encounters:  09/19/19 220 lb (99.8 kg)  04/15/19 272 lb 3.2 oz (123.5 kg)  03/06/19 270 lb 6.4 oz (122.7 kg)     Objective:    Vital Signs:  There were no vitals taken for this visit.   {HeartCare Virtual Exam (Optional):[phone]::"VITAL SIGNS:  reviewed"}  ASSESSMENT & PLAN:  1. ***  COVID-19 Education: The signs and symptoms of COVID-19 were discussed with the patient and how to seek care for testing (follow up with PCP or arrange E-visit).  ***The importance of social distancing was discussed today.  Time:   Today, I have spent *** minutes with the patient with telehealth technology discussing the above problems.     Medication Adjustments/Labs and Tests Ordered: Current medicines are reviewed at length with the patient today.  Concerns regarding medicines are outlined above.   Tests Ordered: No orders of the defined types were placed in this encounter.   Medication Changes: No orders of the defined types were placed in this encounter.   Disposition:  Follow up {follow up:15908}  Signed, Phill Myron. West Pugh, ANP, AACC  10/02/2019 8:16 AM    [place]

## 2019-10-03 ENCOUNTER — Telehealth: Payer: Medicare Other | Admitting: Adult Health

## 2019-11-04 DIAGNOSIS — M7071 Other bursitis of hip, right hip: Secondary | ICD-10-CM | POA: Diagnosis not present

## 2019-11-06 ENCOUNTER — Ambulatory Visit (INDEPENDENT_AMBULATORY_CARE_PROVIDER_SITE_OTHER): Payer: Medicare Other | Admitting: *Deleted

## 2019-11-06 ENCOUNTER — Other Ambulatory Visit: Payer: Self-pay

## 2019-11-06 DIAGNOSIS — Z5181 Encounter for therapeutic drug level monitoring: Secondary | ICD-10-CM

## 2019-11-06 DIAGNOSIS — I743 Embolism and thrombosis of arteries of the lower extremities: Secondary | ICD-10-CM | POA: Diagnosis not present

## 2019-11-06 DIAGNOSIS — Z86711 Personal history of pulmonary embolism: Secondary | ICD-10-CM | POA: Diagnosis not present

## 2019-11-06 DIAGNOSIS — I4821 Permanent atrial fibrillation: Secondary | ICD-10-CM | POA: Diagnosis not present

## 2019-11-06 LAB — PROTIME-INR
INR: 5.2 (ref 0.8–1.2)
Prothrombin Time: 46.2 seconds — ABNORMAL HIGH (ref 11.4–15.2)

## 2019-11-06 LAB — POCT INR: INR: 6.4 — AB (ref 2.0–3.0)

## 2019-11-06 NOTE — Patient Instructions (Addendum)
Description   Hold warfarin today and tomorrow, then start taking warfarin 1.5 tablets daily except for 2 tablets on Mondays and  Wednesdays .  Recheck in 1 week. Coumadin Clinic # [phone]

## 2019-11-06 NOTE — Progress Notes (Signed)
Pt sent to lab to have stat INR drawn. Pt made aware that she is at a hisher risk for bleeding and to seek medical attention if she has any falls or notices any bleeding. Informed pt to not take any warfarin until we call her with the INR results and give her updated warfarin dosing instructions.   Spoke to [facility] lab, INR 5.2, Will use lab INR for dosing.

## 2019-11-11 ENCOUNTER — Other Ambulatory Visit: Payer: Self-pay | Admitting: Cardiology

## 2019-11-14 ENCOUNTER — Ambulatory Visit (INDEPENDENT_AMBULATORY_CARE_PROVIDER_SITE_OTHER): Payer: Medicare Other | Admitting: *Deleted

## 2019-11-14 ENCOUNTER — Other Ambulatory Visit: Payer: Self-pay

## 2019-11-14 DIAGNOSIS — I743 Embolism and thrombosis of arteries of the lower extremities: Secondary | ICD-10-CM | POA: Diagnosis not present

## 2019-11-14 DIAGNOSIS — I4821 Permanent atrial fibrillation: Secondary | ICD-10-CM | POA: Diagnosis not present

## 2019-11-14 DIAGNOSIS — Z86711 Personal history of pulmonary embolism: Secondary | ICD-10-CM | POA: Diagnosis not present

## 2019-11-14 DIAGNOSIS — Z5181 Encounter for therapeutic drug level monitoring: Secondary | ICD-10-CM | POA: Diagnosis not present

## 2019-11-14 LAB — POCT INR: INR: 4.2 — AB (ref 2.0–3.0)

## 2019-11-14 NOTE — Patient Instructions (Signed)
Description   Do not take any Warfarin today then start taking Warfarin 1.5 tablets daily except for 2 tablets on Wednesdays.  Recheck in 1 week. Coumadin Clinic # [phone]

## 2019-11-20 ENCOUNTER — Ambulatory Visit (INDEPENDENT_AMBULATORY_CARE_PROVIDER_SITE_OTHER): Payer: Medicare Other | Admitting: *Deleted

## 2019-11-20 ENCOUNTER — Other Ambulatory Visit: Payer: Self-pay

## 2019-11-20 DIAGNOSIS — Z86711 Personal history of pulmonary embolism: Secondary | ICD-10-CM

## 2019-11-20 DIAGNOSIS — I743 Embolism and thrombosis of arteries of the lower extremities: Secondary | ICD-10-CM | POA: Diagnosis not present

## 2019-11-20 DIAGNOSIS — I4821 Permanent atrial fibrillation: Secondary | ICD-10-CM

## 2019-11-20 DIAGNOSIS — Z5181 Encounter for therapeutic drug level monitoring: Secondary | ICD-10-CM | POA: Diagnosis not present

## 2019-11-20 LAB — POCT INR: INR: 3.2 — AB (ref 2.0–3.0)

## 2019-11-20 NOTE — Patient Instructions (Signed)
Description   Continue taking Warfarin 1.5 tablets daily except for 2 tablets on Wednesdays.  Recheck in 2 weeks. Coumadin Clinic # [phone]

## 2019-12-04 ENCOUNTER — Other Ambulatory Visit: Payer: Self-pay

## 2019-12-04 ENCOUNTER — Ambulatory Visit (INDEPENDENT_AMBULATORY_CARE_PROVIDER_SITE_OTHER): Payer: Medicare Other | Admitting: *Deleted

## 2019-12-04 DIAGNOSIS — Z86711 Personal history of pulmonary embolism: Secondary | ICD-10-CM

## 2019-12-04 DIAGNOSIS — I743 Embolism and thrombosis of arteries of the lower extremities: Secondary | ICD-10-CM | POA: Diagnosis not present

## 2019-12-04 DIAGNOSIS — Z5181 Encounter for therapeutic drug level monitoring: Secondary | ICD-10-CM | POA: Diagnosis not present

## 2019-12-04 DIAGNOSIS — I4821 Permanent atrial fibrillation: Secondary | ICD-10-CM

## 2019-12-04 LAB — POCT INR: INR: 3.1 — AB (ref 2.0–3.0)

## 2019-12-04 NOTE — Patient Instructions (Signed)
Description   Continue taking Warfarin 1.5 tablets daily except for 2 tablets on Wednesdays.  Recheck in 3 weeks. Coumadin Clinic # [phone]

## 2020-01-04 ENCOUNTER — Other Ambulatory Visit: Payer: Self-pay | Admitting: Adult Health

## 2020-01-10 ENCOUNTER — Other Ambulatory Visit: Payer: Self-pay

## 2020-01-13 ENCOUNTER — Other Ambulatory Visit: Payer: Self-pay

## 2020-01-13 ENCOUNTER — Ambulatory Visit (INDEPENDENT_AMBULATORY_CARE_PROVIDER_SITE_OTHER): Payer: Medicare Other | Admitting: *Deleted

## 2020-01-13 DIAGNOSIS — Z5181 Encounter for therapeutic drug level monitoring: Secondary | ICD-10-CM

## 2020-01-13 DIAGNOSIS — I4821 Permanent atrial fibrillation: Secondary | ICD-10-CM | POA: Diagnosis not present

## 2020-01-13 DIAGNOSIS — Z86711 Personal history of pulmonary embolism: Secondary | ICD-10-CM | POA: Diagnosis not present

## 2020-01-13 DIAGNOSIS — I743 Embolism and thrombosis of arteries of the lower extremities: Secondary | ICD-10-CM | POA: Diagnosis not present

## 2020-01-13 LAB — POCT INR: INR: 3.3 — AB (ref 2.0–3.0)

## 2020-01-13 NOTE — Patient Instructions (Signed)
Description   Continue taking Warfarin 1.5 tablets daily except for 2 tablets on Wednesdays.  Recheck in 6 weeks. Coumadin Clinic # [phone]

## 2020-01-14 ENCOUNTER — Other Ambulatory Visit: Payer: Self-pay | Admitting: Adult Health

## 2020-01-14 NOTE — Telephone Encounter (Signed)
Rx has been sent to the pharmacy electronically. ° °

## 2020-01-27 DIAGNOSIS — H26493 Other secondary cataract, bilateral: Secondary | ICD-10-CM | POA: Diagnosis not present

## 2020-02-11 DIAGNOSIS — Z23 Encounter for immunization: Secondary | ICD-10-CM | POA: Diagnosis not present

## 2020-02-19 ENCOUNTER — Other Ambulatory Visit: Payer: Self-pay | Admitting: Cardiology

## 2020-02-24 ENCOUNTER — Ambulatory Visit (INDEPENDENT_AMBULATORY_CARE_PROVIDER_SITE_OTHER): Payer: Medicare Other | Admitting: *Deleted

## 2020-02-24 ENCOUNTER — Other Ambulatory Visit: Payer: Self-pay

## 2020-02-24 DIAGNOSIS — I4821 Permanent atrial fibrillation: Secondary | ICD-10-CM

## 2020-02-24 DIAGNOSIS — I743 Embolism and thrombosis of arteries of the lower extremities: Secondary | ICD-10-CM | POA: Diagnosis not present

## 2020-02-24 DIAGNOSIS — Z5181 Encounter for therapeutic drug level monitoring: Secondary | ICD-10-CM | POA: Diagnosis not present

## 2020-02-24 DIAGNOSIS — Z86711 Personal history of pulmonary embolism: Secondary | ICD-10-CM

## 2020-02-24 LAB — POCT INR: INR: 2.8 (ref 2.0–3.0)

## 2020-02-24 NOTE — Patient Instructions (Signed)
Description   Continue taking Warfarin 1.5 tablets daily except for 2 tablets on Wednesdays.  Recheck in 6 weeks. Coumadin Clinic # [phone]

## 2020-03-06 ENCOUNTER — Other Ambulatory Visit: Payer: Self-pay | Admitting: Cardiology

## 2020-04-06 ENCOUNTER — Other Ambulatory Visit: Payer: Self-pay

## 2020-04-06 ENCOUNTER — Ambulatory Visit (INDEPENDENT_AMBULATORY_CARE_PROVIDER_SITE_OTHER): Payer: Medicare Other | Admitting: *Deleted

## 2020-04-06 DIAGNOSIS — Z86711 Personal history of pulmonary embolism: Secondary | ICD-10-CM

## 2020-04-06 DIAGNOSIS — Z5181 Encounter for therapeutic drug level monitoring: Secondary | ICD-10-CM

## 2020-04-06 DIAGNOSIS — I4821 Permanent atrial fibrillation: Secondary | ICD-10-CM

## 2020-04-06 DIAGNOSIS — I743 Embolism and thrombosis of arteries of the lower extremities: Secondary | ICD-10-CM

## 2020-04-06 LAB — POCT INR: INR: 1.9 — AB (ref 2.0–3.0)

## 2020-04-06 NOTE — Patient Instructions (Signed)
Description    Take 2 tablets today and tomorrow then continue taking Warfarin 1.5 tablets daily except for 2 tablets on Wednesdays.  Recheck in 3 weeks. Coumadin Clinic # [phone]

## 2020-05-01 ENCOUNTER — Ambulatory Visit (INDEPENDENT_AMBULATORY_CARE_PROVIDER_SITE_OTHER): Payer: Medicare Other

## 2020-05-01 ENCOUNTER — Other Ambulatory Visit: Payer: Self-pay

## 2020-05-01 DIAGNOSIS — I743 Embolism and thrombosis of arteries of the lower extremities: Secondary | ICD-10-CM

## 2020-05-01 DIAGNOSIS — Z86711 Personal history of pulmonary embolism: Secondary | ICD-10-CM | POA: Diagnosis not present

## 2020-05-01 DIAGNOSIS — Z5181 Encounter for therapeutic drug level monitoring: Secondary | ICD-10-CM | POA: Diagnosis not present

## 2020-05-01 DIAGNOSIS — I4821 Permanent atrial fibrillation: Secondary | ICD-10-CM

## 2020-05-01 LAB — POCT INR: INR: 2.7 (ref 2.0–3.0)

## 2020-05-01 NOTE — Patient Instructions (Signed)
-   continue taking Warfarin 1.5 tablets daily except for 2 tablets on Wednesdays.   - Recheck in 4 weeks.  Coumadin Clinic # [phone]

## 2020-05-28 ENCOUNTER — Other Ambulatory Visit: Payer: Self-pay

## 2020-05-28 MED ORDER — WARFARIN SODIUM 3 MG PO TABS: ORAL_TABLET | ORAL | 0 refills | Status: DC

## 2020-06-09 NOTE — Addendum Note (Signed)
Addended by: Marcelle Overlie D on: 06/09/2020 05:01 PM   Modules accepted: Orders

## 2020-06-17 ENCOUNTER — Ambulatory Visit (INDEPENDENT_AMBULATORY_CARE_PROVIDER_SITE_OTHER): Payer: Medicare Other

## 2020-06-17 ENCOUNTER — Other Ambulatory Visit: Payer: Self-pay

## 2020-06-17 DIAGNOSIS — I4821 Permanent atrial fibrillation: Secondary | ICD-10-CM

## 2020-06-17 DIAGNOSIS — Z86711 Personal history of pulmonary embolism: Secondary | ICD-10-CM | POA: Diagnosis not present

## 2020-06-17 DIAGNOSIS — Z5181 Encounter for therapeutic drug level monitoring: Secondary | ICD-10-CM

## 2020-06-17 DIAGNOSIS — I743 Embolism and thrombosis of arteries of the lower extremities: Secondary | ICD-10-CM

## 2020-06-17 LAB — POCT INR: INR: 2 (ref 2.0–3.0)

## 2020-06-17 NOTE — Patient Instructions (Signed)
Description   Take 2 tablets today and tomorrow, then resume same dosage of Warfarin 1.5 tablets daily except for 2 tablets on Wednesdays.  Recheck in 3 weeks. Coumadin Clinic # [phone]

## 2020-07-13 ENCOUNTER — Ambulatory Visit (INDEPENDENT_AMBULATORY_CARE_PROVIDER_SITE_OTHER): Payer: Medicare Other | Admitting: *Deleted

## 2020-07-13 ENCOUNTER — Other Ambulatory Visit: Payer: Self-pay

## 2020-07-13 DIAGNOSIS — I743 Embolism and thrombosis of arteries of the lower extremities: Secondary | ICD-10-CM | POA: Diagnosis not present

## 2020-07-13 DIAGNOSIS — Z5181 Encounter for therapeutic drug level monitoring: Secondary | ICD-10-CM

## 2020-07-13 DIAGNOSIS — Z86711 Personal history of pulmonary embolism: Secondary | ICD-10-CM | POA: Diagnosis not present

## 2020-07-13 DIAGNOSIS — I4821 Permanent atrial fibrillation: Secondary | ICD-10-CM | POA: Diagnosis not present

## 2020-07-13 LAB — POCT INR: INR: 2.9 (ref 2.0–3.0)

## 2020-07-13 NOTE — Patient Instructions (Signed)
Description   Continue taking Warfarin 1.5 tablets daily except for 2 tablets on Wednesdays. Recheck in 4 weeks. Coumadin Clinic # [phone]

## 2020-08-05 ENCOUNTER — Other Ambulatory Visit: Payer: Self-pay | Admitting: Adult Health

## 2020-08-10 ENCOUNTER — Ambulatory Visit (INDEPENDENT_AMBULATORY_CARE_PROVIDER_SITE_OTHER): Payer: Medicare Other

## 2020-08-10 ENCOUNTER — Other Ambulatory Visit: Payer: Self-pay

## 2020-08-10 DIAGNOSIS — Z86711 Personal history of pulmonary embolism: Secondary | ICD-10-CM

## 2020-08-10 DIAGNOSIS — Z5181 Encounter for therapeutic drug level monitoring: Secondary | ICD-10-CM

## 2020-08-10 DIAGNOSIS — I743 Embolism and thrombosis of arteries of the lower extremities: Secondary | ICD-10-CM

## 2020-08-10 DIAGNOSIS — I4821 Permanent atrial fibrillation: Secondary | ICD-10-CM | POA: Diagnosis not present

## 2020-08-10 LAB — POCT INR: INR: 2.4 (ref 2.0–3.0)

## 2020-08-10 NOTE — Patient Instructions (Signed)
-   take 2 tablets warfarin tonight, then - Continue taking Warfarin 1.5 tablets daily except for 2 tablets on Wednesdays.  - Recheck in 4 weeks.  Coumadin Clinic # [phone]

## 2020-09-03 ENCOUNTER — Emergency Department (HOSPITAL_COMMUNITY): Payer: Medicare Other

## 2020-09-03 ENCOUNTER — Inpatient Hospital Stay (HOSPITAL_COMMUNITY)
Admission: EM | Admit: 2020-09-03 | Discharge: 2020-09-08 | DRG: 291 | Disposition: A | Discharge status: Skilled Nursing Facility | Payer: Medicare Other | Attending: Family Medicine | Admitting: Family Medicine

## 2020-09-03 ENCOUNTER — Other Ambulatory Visit: Payer: Self-pay

## 2020-09-03 ENCOUNTER — Encounter (HOSPITAL_COMMUNITY): Payer: Self-pay | Admitting: Emergency Medicine

## 2020-09-03 DIAGNOSIS — Z86711 Personal history of pulmonary embolism: Secondary | ICD-10-CM

## 2020-09-03 DIAGNOSIS — I10 Essential (primary) hypertension: Secondary | ICD-10-CM | POA: Diagnosis present

## 2020-09-03 DIAGNOSIS — I5043 Acute on chronic combined systolic (congestive) and diastolic (congestive) heart failure: Secondary | ICD-10-CM | POA: Diagnosis present

## 2020-09-03 DIAGNOSIS — Z6841 Body Mass Index (BMI) 40.0 and over, adult: Secondary | ICD-10-CM | POA: Diagnosis not present

## 2020-09-03 DIAGNOSIS — I110 Hypertensive heart disease with heart failure: Secondary | ICD-10-CM | POA: Diagnosis not present

## 2020-09-03 DIAGNOSIS — J9601 Acute respiratory failure with hypoxia: Secondary | ICD-10-CM | POA: Diagnosis not present

## 2020-09-03 DIAGNOSIS — R0902 Hypoxemia: Secondary | ICD-10-CM

## 2020-09-03 DIAGNOSIS — I517 Cardiomegaly: Secondary | ICD-10-CM | POA: Diagnosis not present

## 2020-09-03 DIAGNOSIS — W109XXA Fall (on) (from) unspecified stairs and steps, initial encounter: Secondary | ICD-10-CM | POA: Diagnosis present

## 2020-09-03 DIAGNOSIS — Z7982 Long term (current) use of aspirin: Secondary | ICD-10-CM

## 2020-09-03 DIAGNOSIS — M533 Sacrococcygeal disorders, not elsewhere classified: Secondary | ICD-10-CM | POA: Diagnosis not present

## 2020-09-03 DIAGNOSIS — I050 Rheumatic mitral stenosis: Secondary | ICD-10-CM

## 2020-09-03 DIAGNOSIS — M25552 Pain in left hip: Secondary | ICD-10-CM | POA: Diagnosis not present

## 2020-09-03 DIAGNOSIS — I4821 Permanent atrial fibrillation: Secondary | ICD-10-CM | POA: Diagnosis present

## 2020-09-03 DIAGNOSIS — Z86718 Personal history of other venous thrombosis and embolism: Secondary | ICD-10-CM

## 2020-09-03 DIAGNOSIS — M25551 Pain in right hip: Secondary | ICD-10-CM | POA: Diagnosis not present

## 2020-09-03 DIAGNOSIS — Z888 Allergy status to other drugs, medicaments and biological substances status: Secondary | ICD-10-CM

## 2020-09-03 DIAGNOSIS — W19XXXA Unspecified fall, initial encounter: Secondary | ICD-10-CM

## 2020-09-03 DIAGNOSIS — Z20822 Contact with and (suspected) exposure to covid-19: Secondary | ICD-10-CM | POA: Diagnosis not present

## 2020-09-03 DIAGNOSIS — I509 Heart failure, unspecified: Secondary | ICD-10-CM

## 2020-09-03 DIAGNOSIS — I5023 Acute on chronic systolic (congestive) heart failure: Secondary | ICD-10-CM | POA: Diagnosis not present

## 2020-09-03 DIAGNOSIS — M1611 Unilateral primary osteoarthritis, right hip: Secondary | ICD-10-CM | POA: Diagnosis not present

## 2020-09-03 DIAGNOSIS — J811 Chronic pulmonary edema: Secondary | ICD-10-CM | POA: Diagnosis not present

## 2020-09-03 DIAGNOSIS — M5136 Other intervertebral disc degeneration, lumbar region: Secondary | ICD-10-CM | POA: Diagnosis present

## 2020-09-03 DIAGNOSIS — Y92009 Unspecified place in unspecified non-institutional (private) residence as the place of occurrence of the external cause: Secondary | ICD-10-CM

## 2020-09-03 DIAGNOSIS — I428 Other cardiomyopathies: Secondary | ICD-10-CM | POA: Diagnosis present

## 2020-09-03 DIAGNOSIS — Z7901 Long term (current) use of anticoagulants: Secondary | ICD-10-CM

## 2020-09-03 DIAGNOSIS — R739 Hyperglycemia, unspecified: Secondary | ICD-10-CM | POA: Diagnosis present

## 2020-09-03 DIAGNOSIS — I69354 Hemiplegia and hemiparesis following cerebral infarction affecting left non-dominant side: Secondary | ICD-10-CM

## 2020-09-03 DIAGNOSIS — S0990XA Unspecified injury of head, initial encounter: Secondary | ICD-10-CM | POA: Diagnosis not present

## 2020-09-03 DIAGNOSIS — Z8673 Personal history of transient ischemic attack (TIA), and cerebral infarction without residual deficits: Secondary | ICD-10-CM

## 2020-09-03 DIAGNOSIS — S299XXA Unspecified injury of thorax, initial encounter: Secondary | ICD-10-CM | POA: Diagnosis not present

## 2020-09-03 DIAGNOSIS — D6862 Lupus anticoagulant syndrome: Secondary | ICD-10-CM | POA: Diagnosis present

## 2020-09-03 DIAGNOSIS — M545 Low back pain, unspecified: Secondary | ICD-10-CM | POA: Diagnosis not present

## 2020-09-03 DIAGNOSIS — M549 Dorsalgia, unspecified: Secondary | ICD-10-CM | POA: Diagnosis not present

## 2020-09-03 DIAGNOSIS — I4891 Unspecified atrial fibrillation: Secondary | ICD-10-CM | POA: Diagnosis not present

## 2020-09-03 DIAGNOSIS — Z79899 Other long term (current) drug therapy: Secondary | ICD-10-CM

## 2020-09-03 LAB — BASIC METABOLIC PANEL
Anion gap: 11 (ref 5–15)
BUN: 12 mg/dL (ref 8–23)
CO2: 23 mmol/L (ref 22–32)
Calcium: 8.5 mg/dL — ABNORMAL LOW (ref 8.9–10.3)
Chloride: 101 mmol/L (ref 98–111)
Creatinine, Ser: 0.91 mg/dL (ref 0.44–1.00)
GFR, Estimated: >60 mL/min (ref >60)
Glucose, Bld: 180 mg/dL — ABNORMAL HIGH (ref 70–99)
Potassium: 4.2 mmol/L (ref 3.5–5.1)
Sodium: 135 mmol/L (ref 135–145)

## 2020-09-03 LAB — CBC
HCT: 44.8 % (ref 36.0–46.0)
Hemoglobin: 14.2 g/dL (ref 12.0–15.0)
MCH: 32.1 pg (ref 26.0–34.0)
MCHC: 31.7 g/dL (ref 30.0–36.0)
MCV: 101.1 fL — ABNORMAL HIGH (ref 80.0–100.0)
Platelets: 196 10*3/uL (ref 150–400)
RBC: 4.43 MIL/uL (ref 3.87–5.11)
RDW: 13.2 % (ref 11.5–15.5)
WBC: 12.3 10*3/uL — ABNORMAL HIGH (ref 4.0–10.5)
nRBC: 0 % (ref 0.0–0.2)

## 2020-09-03 LAB — PROTIME-INR
INR: 1.8 — ABNORMAL HIGH (ref 0.8–1.2)
Prothrombin Time: 21.3 seconds — ABNORMAL HIGH (ref 11.4–15.2)

## 2020-09-03 MED ORDER — MORPHINE SULFATE (PF) 4 MG/ML IV SOLN
4.0000 mg | Freq: Once | INTRAVENOUS | Status: AC
  Administered 2020-09-04: 4 mg via INTRAVENOUS
  Filled 2020-09-03: qty 1

## 2020-09-03 MED ORDER — METOPROLOL TARTRATE 5 MG/5ML IV SOLN
5.0000 mg | Freq: Once | INTRAVENOUS | Status: AC
  Administered 2020-09-04: 5 mg via INTRAVENOUS
  Filled 2020-09-03: qty 5

## 2020-09-03 NOTE — ED Provider Notes (Signed)
Emergency Medicine Provider Triage Evaluation Note  [name] , a 75 y.o. female  was evaluated in triage.  Pt complains of fall.  States that she tripped and fell.  Landed on right hip.  Complains of low back and right hip pain.  Rates pain as 7/10.  Review of Systems  Positive: Joint pain, back pain Negative: Weakness, numbness  Physical Exam  There were no vitals taken for this visit. Gen:   Awake, no distress   Resp:  Normal effort  MSK:   Moves lower extremities with pain due to the fall Other:    Medical Decision Making  Medically screening exam initiated at 10:20 PM.  Appropriate orders placed.  [name] was informed that the remainder of the evaluation will be completed by another provider, this initial triage assessment does not replace that evaluation, and the importance of remaining in the ED until their evaluation is complete.     Montine Circle, PA-C 09/03/20 2221    Tegeler, Gwenyth Allegra, MD 09/04/20 [phone]

## 2020-09-03 NOTE — ED Notes (Signed)
[name] [phone] daughter would like phone calls with updates

## 2020-09-03 NOTE — ED Triage Notes (Addendum)
Patient is from home, trip down two steps on a staircase.  She fell backwards and landed on her buttocks.  CSMT;s intact.  She complaining right lower buttock.  No LOC.

## 2020-09-03 NOTE — ED Provider Notes (Signed)
[facility] EMERGENCY DEPARTMENT Provider Note   CSN: [id number] Arrival date & time: 09/03/20  2218     History Chief Complaint  Patient presents with   [name] is a 75 y.o. female.   Fall   Patient presents to the ED for evaluation after a fall.  Patient was walking down the steps when she stumbled and tripped and fell landing on her buttocks.  Patient also landed on her right hip.  Since then the patient's been having pain in her hip and lower buttock.  Patient has not been able to walk.  She denies any headache or loss of consciousness but she does take.  Did not have any abdominal pain.  No focal numbness or weakness.  Past Medical History:  Diagnosis Date   (HFpEF) heart failure with preserved ejection fraction ([place])    a. 06/2008 Echo: EF 55-60%, mild LVH, triv AI, mild to mod MS, mod to sev dil LA, mildly dil RA.   Acute right MCA stroke ([place])    a. 08/02/3218 Embolic stroke treated with TPA and thrombectomy with hemorrhagic transformation; Carotids 3/12 negative for ICA stenosis.   Bilateral Pulmonary Emboli    a. 08/2005 - s/p IVC filter.   Depression    Embolus of femoral artery ([place])    a. 06/2008 s/p bilateral embolectomies.   History of DVT (deep vein thrombosis)    a. s/p IVC filter.   HIT (heparin-induced thrombocytopenia) (HCC)    HTN (hypertension)    Lupus anticoagulant disorder (HCC)    Obesity, unspecified    Permanent atrial fibrillation ([place])    a. On chronic Coumadin - INRs followed by PCP; b. CHA2DS2VASc = 5.   Popliteal artery embolism, right ([place])    a. 01/2017 in the setting of subRx INR-->s/p embolectomy.    Patient Active Problem List   Diagnosis Date Noted   Essential hypertension 06/27/2017   History of lupus anticoagulant disorder 03/31/2017   NICM (nonischemic cardiomyopathy) ([place]) 03/31/2017   Streptococcal bacteremia    History of MRSA infection    Wound infection 03/24/2017   Cellulitis of right  lower extremity 03/24/2017   Sepsis due to cellulitis ([place]) 03/04/2017   Popliteal artery embolus ([place]) 02/16/2017   Encounter for therapeutic drug monitoring 06/11/2013   History of CVA (cerebrovascular accident) 11/18/2010   Long term (current) use of anticoagulants 08/09/2010   Obesity, unspecified 08/14/2008   Permanent atrial fibrillation ([place]) 03/07/2008   History of pulmonary embolism 08/30/2005    Past Surgical History:  Procedure Laterality Date   distal radius fracture. (12,/16/2010)     EMBOLECTOMY Right 02/15/2017   Procedure: EMBOLECTOMY RIGHT LEG;  Surgeon: Elam Dutch, MD;  Location: [facility] OR;  Service: Vascular;  Laterality: Right;   I & D EXTREMITY Right 03/05/2017   Procedure: IRRIGATION AND DEBRIDEMENT RIGHT LEG HEMATOMA EVACUATION;  Surgeon: Elam Dutch, MD;  Location: [facility] OR;  Service: Vascular;  Laterality: Right;   IVC Placement 08/30/2005     Open reduction and internal fixation of left intra-articular       OB History   No obstetric history on file.     Family History  Problem Relation Age of Onset   Coronary artery disease Other    Diabetes Other     Social History   Tobacco Use   Smoking status: Never   Smokeless tobacco: Never  Vaping Use   Vaping Use: Never used  Substance Use Topics  Alcohol use: No   Drug use: No    Home Medications Prior to Admission medications   Medication Sig Start Date End Date Taking? Authorizing Provider  acetaminophen (TYLENOL) 500 MG tablet Take 1,000 mg by mouth every 6 (six) hours as needed for mild pain.    [provider]  Ascorbic Acid (VITA-C PO) Take 1 tablet by mouth daily.    [provider]  aspirin 81 MG tablet Take 81 mg by mouth daily.      [provider]  cetirizine (ZYRTEC) 10 MG tablet Take 10 mg by mouth daily as needed for allergies.    [provider]  Cholecalciferol (VITAMIN D3) 125 MCG (5000 UT) CAPS Take 5,000 Units by mouth daily.     [provider]  FLUoxetine (PROZAC) 20 MG capsule Take 20 mg by mouth daily. 04/12/17   [provider]  furosemide (LASIX) 40 MG tablet Take 1 tablet (40 mg total) by mouth daily. 03/06/19   Lendon Colonel, NP  lidocaine (LIDODERM) 5 % Place 1 patch onto the skin daily. Remove & Discard patch within 12 hours or as directed by MD 09/19/19   Deliah Boston, PA-C  losartan (COZAAR) 100 MG tablet Take 1 tablet by mouth once daily 08/06/20   Lendon Colonel, NP  metoprolol tartrate (LOPRESSOR) 25 MG tablet Take 1 tablet by mouth twice daily 02/21/20   Minus Breeding, MD  potassium chloride (K-DUR) 10 MEQ tablet TAKE 1 TABLET BY MOUTH ONCE DAILY Patient taking differently: Take 10 mEq by mouth daily.  04/23/18   Erlene Quan, PA-C  warfarin (COUMADIN) 3 MG tablet TAKE 1 & 1/2 TO 2 (ONE & ONE-HALF TO TWO) TABLETS BY MOUTH ONCE DAILY AS DIRECTED BY  COUMADIN  CLINIC 05/28/20   Minus Breeding, MD    Allergies    Heparin and Other  Review of Systems   Review of Systems  All other systems reviewed and are negative.  Physical Exam Updated Vital Signs BP (!) 183/153 (BP Location: Right Arm)   Pulse 95   Temp 98.3 F (36.8 C) (Oral)   Resp 20   SpO2 (!) 89%   Physical Exam Vitals and nursing note reviewed.  Constitutional:      General: She is not in acute distress.    Appearance: She is well-developed.  HENT:     Head: Normocephalic and atraumatic.     Right Ear: External ear normal.     Left Ear: External ear normal.  Eyes:     General: No scleral icterus.       Right eye: No discharge.        Left eye: No discharge.     Conjunctiva/sclera: Conjunctivae normal.  Neck:     Trachea: No tracheal deviation.  Cardiovascular:     Rate and Rhythm: Tachycardia present. Rhythm irregularly irregular.  Pulmonary:     Effort: Pulmonary effort is normal. No respiratory distress.     Breath sounds: Normal breath sounds. No stridor. No wheezing or rales.  Abdominal:      General: Bowel sounds are normal. There is no distension.     Palpations: Abdomen is soft.     Tenderness: There is no abdominal tenderness. There is no guarding or rebound.  Musculoskeletal:        General: No tenderness or deformity.     Cervical back: Normal and neck supple.     Thoracic back: Normal.     Lumbar back: Normal.  Comments: Tenderness palpation primarily right buttock, no tenderness in the hips, no tenderness palpation upper extremities  Skin:    General: Skin is warm and dry.     Findings: No rash.  Neurological:     General: No focal deficit present.     Mental Status: She is alert.     Cranial Nerves: No cranial nerve deficit (no facial droop, extraocular movements intact, no slurred speech).     Sensory: No sensory deficit.     Motor: No abnormal muscle tone or seizure activity.     Coordination: Coordination normal.  Psychiatric:        Mood and Affect: Mood normal.    ED Results / Procedures / Treatments   Labs (all labs ordered are listed, but only abnormal results are displayed) Labs Reviewed  CBC  BASIC METABOLIC PANEL  PROTIME-INR    EKG EKG Interpretation  Date/Time:  Thursday September 03 2020 22:30:55 EDT Ventricular Rate:  109 PR Interval:    QRS Duration: 78 QT Interval:  306 QTC Calculation: 412 R Axis:   105 Text Interpretation: Atrial fibrillation Rightward axis Possible Anterior infarct , age undetermined Abnormal ECG No significant change since last tracing Confirmed by Dorie Rank [phone]) on 09/03/2020 10:36:17 PM  Radiology No results found.  Procedures Procedures   Medications Ordered in ED Medications  morphine 4 MG/ML injection 4 mg (has no administration in time range)  metoprolol tartrate (LOPRESSOR) injection 5 mg (has no administration in time range)    ED Course  I have reviewed the triage vital signs and the nursing notes.  Pertinent labs & imaging results that were available during my care of the patient were  reviewed by me and considered in my medical decision making (see chart for details).    MDM Rules/Calculators/A&P                          Patient presented to the ED after falling down steps.  Patient is primarily having pain in her buttock.  No cervical thoracic or lumbar spinal tenderness.  We will proceed with x-rays.  Patient does have known history of A. fib is on anticoagulation.  Head CT ordered.  Scans are pending.  We will need to reassess her pain after imaging tests are complete.  Care turned over to Dr. Leonette Monarch Final Clinical Impression(s) / ED Diagnoses Final diagnoses:  Fall    Rx / DC Orders ED Discharge Orders     None        Dorie Rank, MD 09/04/20 0009

## 2020-09-04 ENCOUNTER — Inpatient Hospital Stay (HOSPITAL_COMMUNITY): Payer: Medicare Other

## 2020-09-04 DIAGNOSIS — F329 Major depressive disorder, single episode, unspecified: Secondary | ICD-10-CM | POA: Diagnosis not present

## 2020-09-04 DIAGNOSIS — I5023 Acute on chronic systolic (congestive) heart failure: Secondary | ICD-10-CM | POA: Diagnosis not present

## 2020-09-04 DIAGNOSIS — J9601 Acute respiratory failure with hypoxia: Secondary | ICD-10-CM | POA: Diagnosis present

## 2020-09-04 DIAGNOSIS — W19XXXA Unspecified fall, initial encounter: Secondary | ICD-10-CM | POA: Diagnosis not present

## 2020-09-04 DIAGNOSIS — M545 Low back pain, unspecified: Secondary | ICD-10-CM | POA: Diagnosis not present

## 2020-09-04 DIAGNOSIS — R0902 Hypoxemia: Secondary | ICD-10-CM | POA: Diagnosis not present

## 2020-09-04 DIAGNOSIS — W109XXA Fall (on) (from) unspecified stairs and steps, initial encounter: Secondary | ICD-10-CM | POA: Diagnosis present

## 2020-09-04 DIAGNOSIS — I4891 Unspecified atrial fibrillation: Secondary | ICD-10-CM | POA: Diagnosis not present

## 2020-09-04 DIAGNOSIS — I10 Essential (primary) hypertension: Secondary | ICD-10-CM | POA: Diagnosis not present

## 2020-09-04 DIAGNOSIS — W19XXXD Unspecified fall, subsequent encounter: Secondary | ICD-10-CM | POA: Diagnosis not present

## 2020-09-04 DIAGNOSIS — R29898 Other symptoms and signs involving the musculoskeletal system: Secondary | ICD-10-CM | POA: Diagnosis not present

## 2020-09-04 DIAGNOSIS — R279 Unspecified lack of coordination: Secondary | ICD-10-CM | POA: Diagnosis not present

## 2020-09-04 DIAGNOSIS — M6281 Muscle weakness (generalized): Secondary | ICD-10-CM | POA: Diagnosis not present

## 2020-09-04 DIAGNOSIS — D6862 Lupus anticoagulant syndrome: Secondary | ICD-10-CM | POA: Diagnosis present

## 2020-09-04 DIAGNOSIS — Z20822 Contact with and (suspected) exposure to covid-19: Secondary | ICD-10-CM | POA: Diagnosis present

## 2020-09-04 DIAGNOSIS — I050 Rheumatic mitral stenosis: Secondary | ICD-10-CM

## 2020-09-04 DIAGNOSIS — R739 Hyperglycemia, unspecified: Secondary | ICD-10-CM | POA: Diagnosis present

## 2020-09-04 DIAGNOSIS — Z7982 Long term (current) use of aspirin: Secondary | ICD-10-CM | POA: Diagnosis not present

## 2020-09-04 DIAGNOSIS — Z7901 Long term (current) use of anticoagulants: Secondary | ICD-10-CM | POA: Diagnosis not present

## 2020-09-04 DIAGNOSIS — D6869 Other thrombophilia: Secondary | ICD-10-CM | POA: Diagnosis not present

## 2020-09-04 DIAGNOSIS — I5043 Acute on chronic combined systolic (congestive) and diastolic (congestive) heart failure: Secondary | ICD-10-CM | POA: Diagnosis present

## 2020-09-04 DIAGNOSIS — Z6841 Body Mass Index (BMI) 40.0 and over, adult: Secondary | ICD-10-CM | POA: Diagnosis not present

## 2020-09-04 DIAGNOSIS — E669 Obesity, unspecified: Secondary | ICD-10-CM | POA: Diagnosis not present

## 2020-09-04 DIAGNOSIS — R2681 Unsteadiness on feet: Secondary | ICD-10-CM | POA: Diagnosis not present

## 2020-09-04 DIAGNOSIS — I69354 Hemiplegia and hemiparesis following cerebral infarction affecting left non-dominant side: Secondary | ICD-10-CM | POA: Diagnosis not present

## 2020-09-04 DIAGNOSIS — M62838 Other muscle spasm: Secondary | ICD-10-CM | POA: Diagnosis not present

## 2020-09-04 DIAGNOSIS — M1611 Unilateral primary osteoarthritis, right hip: Secondary | ICD-10-CM | POA: Diagnosis not present

## 2020-09-04 DIAGNOSIS — Z8673 Personal history of transient ischemic attack (TIA), and cerebral infarction without residual deficits: Secondary | ICD-10-CM | POA: Diagnosis not present

## 2020-09-04 DIAGNOSIS — I509 Heart failure, unspecified: Secondary | ICD-10-CM

## 2020-09-04 DIAGNOSIS — I739 Peripheral vascular disease, unspecified: Secondary | ICD-10-CM | POA: Diagnosis not present

## 2020-09-04 DIAGNOSIS — Z9181 History of falling: Secondary | ICD-10-CM | POA: Diagnosis not present

## 2020-09-04 DIAGNOSIS — Z888 Allergy status to other drugs, medicaments and biological substances status: Secondary | ICD-10-CM | POA: Diagnosis not present

## 2020-09-04 DIAGNOSIS — S300XXA Contusion of lower back and pelvis, initial encounter: Secondary | ICD-10-CM | POA: Diagnosis not present

## 2020-09-04 DIAGNOSIS — Z86718 Personal history of other venous thrombosis and embolism: Secondary | ICD-10-CM | POA: Diagnosis not present

## 2020-09-04 DIAGNOSIS — M25551 Pain in right hip: Secondary | ICD-10-CM | POA: Diagnosis not present

## 2020-09-04 DIAGNOSIS — I428 Other cardiomyopathies: Secondary | ICD-10-CM | POA: Diagnosis present

## 2020-09-04 DIAGNOSIS — R2242 Localized swelling, mass and lump, left lower limb: Secondary | ICD-10-CM | POA: Diagnosis not present

## 2020-09-04 DIAGNOSIS — I5022 Chronic systolic (congestive) heart failure: Secondary | ICD-10-CM | POA: Diagnosis not present

## 2020-09-04 DIAGNOSIS — E642 Sequelae of vitamin C deficiency: Secondary | ICD-10-CM | POA: Diagnosis not present

## 2020-09-04 DIAGNOSIS — M549 Dorsalgia, unspecified: Secondary | ICD-10-CM | POA: Diagnosis not present

## 2020-09-04 DIAGNOSIS — M5136 Other intervertebral disc degeneration, lumbar region: Secondary | ICD-10-CM | POA: Diagnosis present

## 2020-09-04 DIAGNOSIS — I4821 Permanent atrial fibrillation: Secondary | ICD-10-CM | POA: Diagnosis not present

## 2020-09-04 DIAGNOSIS — I5020 Unspecified systolic (congestive) heart failure: Secondary | ICD-10-CM | POA: Diagnosis not present

## 2020-09-04 DIAGNOSIS — M25552 Pain in left hip: Secondary | ICD-10-CM | POA: Diagnosis not present

## 2020-09-04 DIAGNOSIS — Z7401 Bed confinement status: Secondary | ICD-10-CM | POA: Diagnosis not present

## 2020-09-04 DIAGNOSIS — I110 Hypertensive heart disease with heart failure: Secondary | ICD-10-CM | POA: Diagnosis present

## 2020-09-04 DIAGNOSIS — Z79899 Other long term (current) drug therapy: Secondary | ICD-10-CM | POA: Diagnosis not present

## 2020-09-04 DIAGNOSIS — Z86711 Personal history of pulmonary embolism: Secondary | ICD-10-CM | POA: Diagnosis not present

## 2020-09-04 DIAGNOSIS — Y92009 Unspecified place in unspecified non-institutional (private) residence as the place of occurrence of the external cause: Secondary | ICD-10-CM | POA: Diagnosis not present

## 2020-09-04 DIAGNOSIS — M533 Sacrococcygeal disorders, not elsewhere classified: Secondary | ICD-10-CM | POA: Diagnosis present

## 2020-09-04 LAB — URINALYSIS, ROUTINE W REFLEX MICROSCOPIC
Bilirubin Urine: NEGATIVE
Glucose, UA: NEGATIVE mg/dL
Ketones, ur: NEGATIVE mg/dL
Leukocytes,Ua: NEGATIVE
Nitrite: NEGATIVE
Protein, ur: NEGATIVE mg/dL
Specific Gravity, Urine: 1.005 (ref 1.005–1.030)
pH: 7 (ref 5.0–8.0)

## 2020-09-04 LAB — ECHOCARDIOGRAM COMPLETE
AR max vel: 0.96 cm2
AV Area VTI: 0.91 cm2
AV Area mean vel: 0.93 cm2
AV Mean grad: 6.5 mmHg
AV Peak grad: 14.1 mmHg
Ao pk vel: 1.88 m/s
MV M vel: 4.2 m/s
MV Peak grad: 70.6 mmHg
MV VTI: 0.56 cm2
S' Lateral: 3.4 cm

## 2020-09-04 LAB — TSH: TSH: 3.381 u[IU]/mL (ref 0.350–4.500)

## 2020-09-04 LAB — BRAIN NATRIURETIC PEPTIDE: B Natriuretic Peptide: 540.9 pg/mL — ABNORMAL HIGH (ref 0.0–100.0)

## 2020-09-04 LAB — PROTIME-INR
INR: 2 — ABNORMAL HIGH (ref 0.8–1.2)
Prothrombin Time: 22.3 seconds — ABNORMAL HIGH (ref 11.4–15.2)

## 2020-09-04 LAB — SARS CORONAVIRUS 2 (TAT 6-24 HRS): SARS Coronavirus 2: NEGATIVE

## 2020-09-04 MED ORDER — SODIUM CHLORIDE 0.9% FLUSH
3.0000 mL | Freq: Two times a day (BID) | INTRAVENOUS | Status: DC
  Administered 2020-09-04 – 2020-09-07 (×7): 3 mL via INTRAVENOUS

## 2020-09-04 MED ORDER — POTASSIUM CHLORIDE CRYS ER 20 MEQ PO TBCR
10.0000 meq | EXTENDED_RELEASE_TABLET | Freq: Every day | ORAL | Status: DC
  Administered 2020-09-04 – 2020-09-08 (×5): 10 meq via ORAL
  Filled 2020-09-04 (×7): qty 1

## 2020-09-04 MED ORDER — WARFARIN SODIUM 5 MG PO TABS
5.0000 mg | ORAL_TABLET | Freq: Once | ORAL | Status: AC
  Administered 2020-09-04: 5 mg via ORAL
  Filled 2020-09-04 (×2): qty 1

## 2020-09-04 MED ORDER — METOPROLOL TARTRATE 25 MG PO TABS
25.0000 mg | ORAL_TABLET | Freq: Two times a day (BID) | ORAL | Status: AC
  Administered 2020-09-04 – 2020-09-07 (×8): 25 mg via ORAL
  Filled 2020-09-04 (×8): qty 1

## 2020-09-04 MED ORDER — ASPIRIN EC 81 MG PO TBEC
81.0000 mg | DELAYED_RELEASE_TABLET | Freq: Every day | ORAL | Status: DC
  Administered 2020-09-04 – 2020-09-08 (×5): 81 mg via ORAL
  Filled 2020-09-04 (×5): qty 1

## 2020-09-04 MED ORDER — TRAMADOL HCL 50 MG PO TABS
50.0000 mg | ORAL_TABLET | Freq: Four times a day (QID) | ORAL | Status: DC | PRN
  Administered 2020-09-04 – 2020-09-08 (×8): 50 mg via ORAL
  Filled 2020-09-04 (×9): qty 1

## 2020-09-04 MED ORDER — FUROSEMIDE 10 MG/ML IJ SOLN
40.0000 mg | Freq: Once | INTRAMUSCULAR | Status: AC
  Administered 2020-09-04: 40 mg via INTRAVENOUS
  Filled 2020-09-04: qty 4

## 2020-09-04 MED ORDER — LORATADINE 10 MG PO TABS: 10.0000 mg | ORAL_TABLET | Freq: Every day | ORAL | Status: DC | PRN

## 2020-09-04 MED ORDER — FUROSEMIDE 10 MG/ML IJ SOLN: 40.0000 mg | Freq: Two times a day (BID) | INTRAMUSCULAR | Status: DC

## 2020-09-04 MED ORDER — LOSARTAN POTASSIUM 50 MG PO TABS
100.0000 mg | ORAL_TABLET | Freq: Every day | ORAL | Status: DC
  Administered 2020-09-04 – 2020-09-08 (×5): 100 mg via ORAL
  Filled 2020-09-04 (×5): qty 2

## 2020-09-04 MED ORDER — METOPROLOL TARTRATE 25 MG PO TABS: 25.0000 mg | ORAL_TABLET | Freq: Two times a day (BID) | ORAL | Status: DC

## 2020-09-04 MED ORDER — WARFARIN SODIUM 1 MG PO TABS
1.0000 mg | ORAL_TABLET | Freq: Once | ORAL | Status: AC
  Administered 2020-09-04: 1 mg via ORAL
  Filled 2020-09-04: qty 1

## 2020-09-04 MED ORDER — ONDANSETRON HCL 4 MG/2ML IJ SOLN: 4.0000 mg | Freq: Four times a day (QID) | INTRAMUSCULAR | Status: DC | PRN

## 2020-09-04 MED ORDER — SODIUM CHLORIDE 0.9% FLUSH
3.0000 mL | INTRAVENOUS | Status: DC | PRN
  Administered 2020-09-08: 3 mL via INTRAVENOUS

## 2020-09-04 MED ORDER — FUROSEMIDE 10 MG/ML IJ SOLN
40.0000 mg | Freq: Two times a day (BID) | INTRAMUSCULAR | Status: AC
  Administered 2020-09-04: 40 mg via INTRAVENOUS
  Filled 2020-09-04: qty 4

## 2020-09-04 MED ORDER — PERFLUTREN LIPID MICROSPHERE
1.0000 mL | INTRAVENOUS | Status: AC | PRN
  Administered 2020-09-04: 2 mL via INTRAVENOUS
  Filled 2020-09-04: qty 10

## 2020-09-04 MED ORDER — SODIUM CHLORIDE 0.9 % IV SOLN: 250.0000 mL | INTRAVENOUS | Status: DC | PRN

## 2020-09-04 MED ORDER — FLUOXETINE HCL 20 MG PO CAPS
20.0000 mg | ORAL_CAPSULE | Freq: Every day | ORAL | Status: DC
  Administered 2020-09-04 – 2020-09-08 (×5): 20 mg via ORAL
  Filled 2020-09-04 (×5): qty 1

## 2020-09-04 MED ORDER — WARFARIN - PHARMACIST DOSING INPATIENT: Freq: Every day | Status: DC

## 2020-09-04 MED ORDER — ACETAMINOPHEN 325 MG PO TABS
650.0000 mg | ORAL_TABLET | ORAL | Status: DC | PRN
  Administered 2020-09-06: 650 mg via ORAL
  Filled 2020-09-04: qty 2

## 2020-09-04 MED ORDER — METOPROLOL TARTRATE 25 MG PO TABS
25.0000 mg | ORAL_TABLET | Freq: Once | ORAL | Status: AC
  Administered 2020-09-04: 25 mg via ORAL
  Filled 2020-09-04: qty 1

## 2020-09-04 MED ORDER — ACETAMINOPHEN 500 MG PO TABS
1000.0000 mg | ORAL_TABLET | Freq: Once | ORAL | Status: AC
  Administered 2020-09-04: 1000 mg via ORAL
  Filled 2020-09-04: qty 2

## 2020-09-04 NOTE — ED Notes (Signed)
Pt unable to sit up in the stretcher with assistance from pain in back area. Will administer tylenol as ordered

## 2020-09-04 NOTE — ED Notes (Signed)
Pt moved to room 20 for iv lasix, puriwick and cardiac monitoring

## 2020-09-04 NOTE — Progress Notes (Signed)
2D echocardiogram with Definity completed.  09/04/2020 10:49 AM Kelby Aline., MHA, RVT, RDCS, RDMS

## 2020-09-04 NOTE — ED Notes (Signed)
Pt unable to tolerate getting up to walk around at this time. RN and I tried to assist and was unable to get her off of stretcher due to pain.

## 2020-09-04 NOTE — Consult Note (Addendum)
Cardiology Consultation:   Patient ID: [name] MRN: [id number]; DOB: September 11, 1945  Admit date: 09/03/2020 Date of Consult: 09/04/2020  PCP:  Josetta Huddle, MD   [facility] Providers Cardiologist:  Minus Breeding, MD   Patient Profile:   [name] is a 75 y.o. female with a hx of Afib, PE/DVT, IVC filter in place and on coumadin, s/p bilateral embolectomies 0/7622, hx of embolic CVA in 6333 requiring TPA with subsequent hemorrhagic conversion, right lower extremity popliteal embolism in the setting of subtherapeutic INR 1.43 requiring surgical intervention, hx of HIT, SLE, obesity, and NICM with EF 40-45% who is being seen 09/04/2020 for the evaluation of CHF exacerbation following a mechanical fall at the request of Dr. Tamala Julian.  History of Present Illness:   [name] has a history of presumed NICM with EF 40-45% in 02/2017. I do not see that she has had a formal ischemic evaluation in recent years. She has a history of permanent Afib rate controlled on BB. She is generally unaware of her Afib. She has lupus and has multiple embolic events. She is on coumadin and has IVC filter in place. She was last seen in clinic on 03/2019 by Jory Sims NP. She has been hypertensive in the office, but reports lower readings at home. She is currently maintained on ASA, 40 mg lasix, 100 mg losan, 25 mg lopressor BID, and 10 mEq K supplementation. It is important for her INR to not drop below 2 given her history.   She presented to [facility] after a mechanical fall in which she tripped walking down steps and fell landing on her buttocks and right hip. Imaging was negative for ICH or SAH and acute fractures. CXR did show cardiomegaly and pulmonary vascular congestion concerning for CHF. In addition, her INR was subtherapeutic at 1.8. She was monitored but found to be mildly hypoxic at 88% on room air and was in Afib RVR. She was given 5 mg IV lopressor and her home doses of lopressor and coumadin.  She had difficulty with ambulation due to pain from fall. She was admitted for further management. Cardiology consulted for heart failure exacerbation.   Echocardiogram this admission shows EF 50-55%, improved from prior study.   During my interview, she recounts that she walked down 2 steps on her front porch, then back up but dropped an envelope, went back down and lost her shoe and tripped coming back up landing on her buttocks. She is still in a considerable amount of pain, waiting for PT eval. She continues to wear supplemental O2. She does not report symptoms of volume overload prior to her fall. She denies chest pain, dyspnea, and palpitations prior to her fall. Sounds mechanical and likely not due to a cardiac etiology. She has baseline left leg edema that she states is her baseline.     Past Medical History:  Diagnosis Date   (HFpEF) heart failure with preserved ejection fraction ([place])    a. 06/2008 Echo: EF 55-60%, mild LVH, triv AI, mild to mod MS, mod to sev dil LA, mildly dil RA.   Acute right MCA stroke ([place])    a. 07/30/5623 Embolic stroke treated with TPA and thrombectomy with hemorrhagic transformation; Carotids 3/12 negative for ICA stenosis.   Bilateral Pulmonary Emboli    a. 08/2005 - s/p IVC filter.   Depression    Embolus of femoral artery ([place])    a. 06/2008 s/p bilateral embolectomies.   History of DVT (deep vein thrombosis)  a. s/p IVC filter.   HIT (heparin-induced thrombocytopenia) (HCC)    HTN (hypertension)    Lupus anticoagulant disorder (HCC)    Obesity, unspecified    Permanent atrial fibrillation ([place])    a. On chronic Coumadin - INRs followed by PCP; b. CHA2DS2VASc = 5.   Popliteal artery embolism, right ([place])    a. 01/2017 in the setting of subRx INR-->s/p embolectomy.    Past Surgical History:  Procedure Laterality Date   distal radius fracture. (12,/16/2010)     EMBOLECTOMY Right 02/15/2017   Procedure: EMBOLECTOMY RIGHT LEG;  Surgeon: Elam Dutch, MD;  Location: [facility] OR;  Service: Vascular;  Laterality: Right;   I & D EXTREMITY Right 03/05/2017   Procedure: IRRIGATION AND DEBRIDEMENT RIGHT LEG HEMATOMA EVACUATION;  Surgeon: Elam Dutch, MD;  Location: [facility] OR;  Service: Vascular;  Laterality: Right;   IVC Placement 08/30/2005     Open reduction and internal fixation of left intra-articular       Home Medications:  Prior to Admission medications   Medication Sig Start Date End Date Taking? Authorizing Provider  acetaminophen (TYLENOL) 500 MG tablet Take 1,000 mg by mouth every 6 (six) hours as needed for mild pain.   Yes [provider]  Ascorbic Acid (VITA-C PO) Take 500 mg by mouth daily.   Yes [provider]  aspirin 81 MG tablet Take 81 mg by mouth daily.     Yes [provider]  cetirizine (ZYRTEC) 10 MG tablet Take 10 mg by mouth daily as needed for allergies.   Yes [provider]  Cholecalciferol (VITAMIN D3) 125 MCG (5000 UT) CAPS Take 5,000 Units by mouth daily.   Yes [provider]  FLUoxetine (PROZAC) 20 MG capsule Take 20 mg by mouth daily. 04/12/17  Yes [provider]  furosemide (LASIX) 40 MG tablet Take 1 tablet (40 mg total) by mouth daily. 03/06/19  Yes Lendon Colonel, NP  losartan (COZAAR) 100 MG tablet Take 1 tablet by mouth once daily 08/06/20  Yes Lendon Colonel, NP  metoprolol tartrate (LOPRESSOR) 25 MG tablet Take 1 tablet by mouth twice daily Patient taking differently: Take 25 mg by mouth 2 (two) times daily. 02/21/20  Yes Hochrein, Jeneen Rinks, MD  potassium chloride (K-DUR) 10 MEQ tablet TAKE 1 TABLET BY MOUTH ONCE DAILY Patient taking differently: Take 10 mEq by mouth daily. 04/23/18  Yes Kilroy, Luke K, PA-C  warfarin (COUMADIN) 3 MG tablet TAKE 1 & 1/2 TO 2 (ONE & ONE-HALF TO TWO) TABLETS BY MOUTH ONCE DAILY AS DIRECTED BY  COUMADIN  CLINIC Patient taking differently: Take 4.5-6 mg by mouth See admin instructions. 4.5 mg  Monday,Tuesday,Thursday,Friday,Saturday and sunday 6 mg wednesday 05/28/20  Yes Hochrein, Jeneen Rinks, MD  lidocaine (LIDODERM) 5 % Place 1 patch onto the skin daily. Remove & Discard patch within 12 hours or as directed by MD Patient not taking: No sig reported 09/19/19   Deliah Boston, PA-C    Inpatient Medications: Scheduled Meds:  aspirin EC  81 mg Oral Daily   FLUoxetine  20 mg Oral Daily   furosemide  40 mg Intravenous BID   losartan  100 mg Oral Daily   metoprolol tartrate  25 mg Oral BID   potassium chloride  10 mEq Oral Daily   sodium chloride flush  3 mL Intravenous Q12H   warfarin  5 mg Oral ONCE-1600   Warfarin - Pharmacist Dosing Inpatient   Does not apply q1600   Continuous  Infusions:  sodium chloride     PRN Meds: sodium chloride, acetaminophen, loratadine, ondansetron (ZOFRAN) IV, sodium chloride flush, traMADol  Allergies:    Allergies  Allergen Reactions   Heparin Hives    Patient attests severe allergy- rash all over, severe itching, unable to take heparin   Other Itching and Rash    "Sheets and Linens used by Zacarias Pontes"    Social History:   Social History   Socioeconomic History   Marital status: Single    Spouse name: Not on file   Number of children: Not on file   Years of education: Not on file   Highest education level: Not on file  Occupational History   Not on file  Tobacco Use   Smoking status: Never   Smokeless tobacco: Never  Vaping Use   Vaping Use: Never used  Substance and Sexual Activity   Alcohol use: No   Drug use: No   Sexual activity: Not Currently  Other Topics Concern   Not on file  Social History Narrative    Lives alone.  She works at a [employer].   Daughter in area to assist if needed.  One-level home, 5-6 steps to   entry.  Functional history prior to admission was independent,   functional status upon admission to rehab services was +2, total assist   bed mobility,+2 total assist transfers, ambulation  not tested, moderate   assist upper body, total assist lower body, activities of daily living.       Social Determinants of Health   Financial Resource Strain: Not on file  Food Insecurity: Not on file  Transportation Needs: Not on file  Physical Activity: Not on file  Stress: Not on file  Social Connections: Not on file  Intimate Partner Violence: Not on file    Family History:    Family History  Problem Relation Age of Onset   Coronary artery disease Other    Diabetes Other      ROS:  Please see the history of present illness.   All other ROS reviewed and negative.     Physical Exam/Data:   Vitals:   09/04/20 1047 09/04/20 1115 09/04/20 1130 09/04/20 1238  BP: 121/62 109/79 121/85 117/79  Pulse: 92 88 87 67  Resp:  20 18 19   Temp:    97.8 F (36.6 C)  TempSrc:    Oral  SpO2:  97% 97% 97%  Weight:      Height:    5\' 6"  (1.676 m)    Intake/Output Summary (Last 24 hours) at 09/04/2020 1629 Last data filed at 09/04/2020 1300 Gross per 24 hour  Intake 237 ml  Output --  Net 237 ml   Last 3 Weights 09/04/2020 09/19/2019 04/15/2019  Weight (lbs) 252 lb 3.3 oz 220 lb 272 lb 3.2 oz  Weight (kg) 114.4 kg 99.791 kg 123.469 kg     Body mass index is 40.71 kg/m.  General:  elderly female in NAD Neck: no JVD Vascular: No carotid bruits  Cardiac:  irregular rhythm, regular rate, ? murmur Lungs:  clear to auscultation bilaterally, no wheezing, rhonchi or rales  Abd: soft, nontender, no hepatomegaly  Ext: mild L LE edema Musculoskeletal:  No deformities, BUE and BLE strength normal and equal Skin: warm and dry  Neuro:  CNs 2-12 intact, no focal abnormalities noted Psych:  Normal affect   EKG:  The EKG was personally reviewed and demonstrates:  Afib RVR ventricular rate 109 Telemetry:  Telemetry was  personally reviewed and demonstrates:  afib with rates now 70-80s  Relevant CV Studies:  Echo 09/04/20:  1. Left ventricular ejection fraction, by estimation, is 55 to  60%. The  left ventricle has normal function. The left ventricle has no regional  wall motion abnormalities. There is mild concentric left ventricular  hypertrophy. Left ventricular diastolic  function could not be evaluated.   2. Right ventricular systolic function is moderately reduced. The right  ventricular size is moderately enlarged. There is mildly elevated  pulmonary artery systolic pressure.   3. Left atrial size was severely dilated.   4. Right atrial size was severely dilated.   5. The mitral valve is rheumatic. There is mild calcification of the  anterior and posterior mitral valve leaflet(s). Moderately decreased  mobility of the mitral valve leaflets. Mild mitral annular calcification.  Mild mitral valve regurgitation. Moderate   to severe mitral stenosis. The mean mitral valve gradient is 10.5 mmHg at  an average heart rate in atrial fibrillation of 78bpm.   6. The aortic valve was not well visualized. Aortic valve regurgitation  is mild. No aortic stenosis is present.   7. The inferior vena cava is normal in size with greater than 50%  respiratory variability, suggesting right atrial pressure of 3 mmHg.   Laboratory Data:  High Sensitivity Troponin:  No results for input(s): TROPONINIHS in the last 720 hours.   Chemistry Recent Labs  Lab 09/03/20 2221  NA 135  K 4.2  CL 101  CO2 23  GLUCOSE 180*  BUN 12  CREATININE 0.91  CALCIUM 8.5*  GFRNONAA >60  ANIONGAP 11    No results for input(s): PROT, ALBUMIN, AST, ALT, ALKPHOS, BILITOT in the last 168 hours. Hematology Recent Labs  Lab 09/03/20 2234  WBC 12.3*  RBC 4.43  HGB 14.2  HCT 44.8  MCV 101.1*  MCH 32.1  MCHC 31.7  RDW 13.2  PLT 196   BNP Recent Labs  Lab 09/04/20 0907  BNP 540.9*    DDimer No results for input(s): DDIMER in the last 168 hours.   Radiology/Studies:  DG Lumbar Spine Complete  Result Date: 09/04/2020 CLINICAL DATA:  Fall, back pain EXAM: LUMBAR SPINE - COMPLETE 4+ VIEW  COMPARISON:  None. FINDINGS: Normal lumbar lordosis. No acute fracture or listhesis of the lumbar spine. Vertebral body height is preserved. There is diffuse mild intervertebral disc space narrowing and endplate remodeling throughout the lumbar spine in keeping with changes of diffuse mild degenerative disc disease. Fracture TrapEase inferior vena cava filter again noted in the expected infrarenal cava. The paraspinal soft tissues are otherwise unremarkable. IMPRESSION: Mild diffuse degenerative disc disease. No acute fracture or listhesis. Electronically Signed   By: Fidela Salisbury MD   On: 09/04/2020 01:15   CT Head Wo Contrast  Result Date: 09/04/2020 CLINICAL DATA:  Head injury, fall EXAM: CT HEAD WITHOUT CONTRAST TECHNIQUE: Contiguous axial images were obtained from the base of the skull through the vertex without intravenous contrast. COMPARISON:  None. FINDINGS: Brain: Normal anatomic configuration. Parenchymal volume loss is commensurate with the patient's age. Mild periventricular white matter changes are present likely reflecting the sequela of small vessel ischemia. 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 There is focal encephalomalacia within the right basal ganglia, caudate body and corona radiata related to intraparenchymal hematoma noted on 06/03/2010, unchanged from prior examination. Tiny remote lacunar infarct noted within the right cerebellar hemisphere. No abnormal intra or extra-axial mass lesion or fluid collection.  No abnormal mass effect or midline shift. No evidence of acute intracranial hemorrhage or infarct. Mild ex vacuo dilation of the right lateral ventricle. Ventricular size is otherwise normal. Cerebellum unremarkable. Vascular: No asymmetric hyperdense vasculature at the skull base. Skull: Intact Sinuses/Orbits: Paranasal sinuses are clear. Orbits are unremarkable. Other: Mastoid air cells and middle ear cavities are clear. IMPRESSION: No acute  intracranial injury.  No calvarial fracture. Stable mild senescent change and remote infarcts as outlined above. Electronically Signed   By: Fidela Salisbury MD   On: 09/04/2020 01:13   DG Chest Portable 1 View  Result Date: 09/04/2020 CLINICAL DATA:  Fall, chest injury EXAM: PORTABLE CHEST 1 VIEW COMPARISON:  03/28/2017 FINDINGS: The left costophrenic angle is excluded from view. The lungs are symmetrically expanded. Mild to moderate cardiomegaly is again noted. There is central pulmonary vascular congestion in keeping with early cardiogenic failure or mild volume overload, new since prior examination. No superimposed overt pulmonary edema. No pneumothorax or definite pleural effusion. No acute bone abnormality. IMPRESSION: Technically limited examination without radiographic evidence of acute cardiopulmonary disease. Stable mild-to-moderate cardiomegaly with development of pulmonary vascular congestion Electronically Signed   By: Fidela Salisbury MD   On: 09/04/2020 01:09   ECHOCARDIOGRAM COMPLETE  Result Date: 09/04/2020    ECHOCARDIOGRAM REPORT   Patient Name:   Jayln ZANAYAH SHADOWENS Date of Exam: 09/04/2020 Medical Rec #:  [id number]       Height:       66.0 in Accession #:    [id number]      Weight:       220.0 lb Date of Birth:  Jul 30, 1945       BSA:          2.082 m Patient Age:    60 years        BP:           123/82 mmHg Patient Gender: F               HR:           75 bpm. Exam Location:  Inpatient Procedure: 2D Echo Indications:    CHF  History:        Patient has prior history of Echocardiogram examinations, most                 recent 03/26/2017. CHF; Risk Factors:Hypertension.  Sonographer:    Maudry Mayhew MHA, RDMS, RVT, RDCS Referring Phys: [id number] [facility] A SMITH  Sonographer Comments: Technically challenging study due to limited acoustic windows. Image acquisition challenging due to patient body habitus. IMPRESSIONS  1. Left ventricular ejection fraction, by estimation, is 55 to 60%. The left  ventricle has normal function. The left ventricle has no regional wall motion abnormalities. There is mild concentric left ventricular hypertrophy. Left ventricular diastolic function could not be evaluated.  2. Right ventricular systolic function is moderately reduced. The right ventricular size is moderately enlarged. There is mildly elevated pulmonary artery systolic pressure.  3. Left atrial size was severely dilated.  4. Right atrial size was severely dilated.  5. The mitral valve is rheumatic. There is mild calcification of the anterior and posterior mitral valve leaflet(s). Moderately decreased mobility of the mitral valve leaflets. Mild mitral annular calcification. Mild mitral valve regurgitation. Moderate  to severe mitral stenosis. The mean mitral valve gradient is 10.5 mmHg at an average heart rate in atrial fibrillation of 78bpm.  6. The aortic valve was not well visualized. Aortic valve regurgitation is mild. No aortic  stenosis is present.  7. The inferior vena cava is normal in size with greater than 50% respiratory variability, suggesting right atrial pressure of 3 mmHg. FINDINGS  Left Ventricle: Left ventricular ejection fraction, by estimation, is 55 to 60%. The left ventricle has normal function. The left ventricle has no regional wall motion abnormalities. Definity contrast agent was given IV to delineate the left ventricular  endocardial borders. The left ventricular internal cavity size was normal in size. There is mild concentric left ventricular hypertrophy. Left ventricular diastolic function could not be evaluated due to atrial fibrillation. Left ventricular diastolic function could not be evaluated. Right Ventricle: The right ventricular size is moderately enlarged. No increase in right ventricular wall thickness. Right ventricular systolic function is moderately reduced. There is mildly elevated pulmonary artery systolic pressure. The tricuspid regurgitant velocity is 3.17 m/s, and with  an assumed right atrial pressure of 3 mmHg, the estimated right ventricular systolic pressure is 74.2 mmHg. Left Atrium: Left atrial size was severely dilated. Right Atrium: Right atrial size was severely dilated. Pericardium: There is no evidence of pericardial effusion. Mitral Valve: The mitral valve is rheumatic. There is mild calcification of the anterior and posterior mitral valve leaflet(s). Moderately decreased mobility of the mitral valve leaflets. Mild mitral annular calcification. Mild mitral valve regurgitation. Moderate to severe mitral valve stenosis. MV peak gradient, 18.7 mmHg. The mean mitral valve gradient is 10.5 mmHg at an average heart rate of 78bpm. Tricuspid Valve: The tricuspid valve is normal in structure. Tricuspid valve regurgitation is mild . No evidence of tricuspid stenosis. Aortic Valve: The aortic valve was not well visualized. Aortic valve regurgitation is mild. No aortic stenosis is present. Aortic valve mean gradient measures 6.5 mmHg. Aortic valve peak gradient measures 14.1 mmHg. Aortic valve area, by VTI measures 0.91 cm. Pulmonic Valve: The pulmonic valve was not well visualized. Pulmonic valve regurgitation is not visualized. No evidence of pulmonic stenosis. Aorta: The aortic root is normal in size and structure. Venous: The inferior vena cava is normal in size with greater than 50% respiratory variability, suggesting right atrial pressure of 3 mmHg. IAS/Shunts: No atrial level shunt detected by color flow Doppler.  LEFT VENTRICLE PLAX 2D LVIDd:         4.70 cm LVIDs:         3.40 cm LV PW:         1.20 cm LV IVS:        1.20 cm LVOT diam:     1.80 cm LV SV:         35 LV SV Index:   17 LVOT Area:     2.54 cm  RIGHT VENTRICLE TAPSE (M-mode): 1.3 cm LEFT ATRIUM              Index        RIGHT ATRIUM           Index LA diam:        6.50 cm  3.12 cm/m   RA Area:     31.70 cm LA Vol (A2C):   168.0 ml 80.67 ml/m  RA Volume:   116.00 ml 55.70 ml/m LA Vol (A4C):   217.0 ml  104.20 ml/m LA Biplane Vol: 200.0 ml 96.04 ml/m  AORTIC VALVE AV Area (Vmax):    0.96 cm AV Area (Vmean):   0.93 cm AV Area (VTI):     0.91 cm AV Vmax:           187.50 cm/s AV Vmean:  121.000 cm/s AV VTI:            0.384 m AV Peak Grad:      14.1 mmHg AV Mean Grad:      6.5 mmHg LVOT Vmax:         70.90 cm/s LVOT Vmean:        44.300 cm/s LVOT VTI:          0.137 m LVOT/AV VTI ratio: 0.36  AORTA Ao Root diam: 3.10 cm MITRAL VALVE              TRICUSPID VALVE MV Area VTI:  0.56 cm    TR Peak grad:   40.2 mmHg MV Peak grad: 18.7 mmHg   TR Vmax:        317.00 cm/s MV Mean grad: 10.5 mmHg MV Vmax:      2.16 m/s    SHUNTS MV Vmean:     154.5 cm/s  Systemic VTI:  0.14 m MR Peak grad: 70.6 mmHg   Systemic Diam: 1.80 cm MR Mean grad: 66.0 mmHg MR Vmax:      420.20 cm/s MR Vmean:     389.0 cm/s Fransico Him MD Electronically signed by Fransico Him MD Signature Date/Time: 09/04/2020/11:41:14 AM    Final    DG HIP UNILAT WITH PELVIS 2-3 VIEWS LEFT  Result Date: 09/04/2020 CLINICAL DATA:  Fall, left hip pain EXAM: DG HIP (WITH OR WITHOUT PELVIS) 2-3V LEFT COMPARISON:  None. FINDINGS: Normal alignment. No fracture or dislocation. Mild left hip degenerative arthritis. Surgical clips are seen surrounding the left hip. IMPRESSION: No acute fracture or dislocation. Electronically Signed   By: Fidela Salisbury MD   On: 09/04/2020 01:18   DG HIP UNILAT WITH PELVIS 2-3 VIEWS RIGHT  Result Date: 09/04/2020 CLINICAL DATA:  Fall buttock pain EXAM: DG HIP (WITH OR WITHOUT PELVIS) 2-3V RIGHT COMPARISON:  09/19/2019 FINDINGS: Single view radiograph of the pelvis and two view radiograph of the right hip demonstrates normal alignment. No fracture or dislocation. Mild right hip degenerative arthritis, stable since prior examination. Multiple surgical clips noted surrounding the left hip. IMPRESSION: No acute fracture or dislocation. Electronically Signed   By: Fidela Salisbury MD   On: 09/04/2020 01:17     Assessment and  Plan:   Acute on chronic systolic heart failure Mild respiratory failure - EF now improved to 55% - BNP 541 - CXR with vascular congestion - has received 40 mg lasix IV x 1 dose, scheduled to receive one more dose of IV lasix - she take 40 mg daily at home - continue BB and losartan - suspect she will not need further IV diuresis tomorrow   Permanent Afib, RVR - maintained on lopressor 25 mg BID - mildly tachycardic in the 100-110s overnight, likely related to pain, resolved with IV lopressor - HR today in the 60-80s   Leukocytosis - WBC 12.3 - afebrile - COVID negative - check a UA   Moderate to severe mitral stenosis Mild MR - this is a progression from 2018 study which showed mild annular calcification - will need to monitor symptoms - consider TEE   Hypertension - hypertensive in the ER 183/153, likely due to pain - pressure now controlled on home medication and IV lasix - do not anticipate any changes to her home regimen   Need for chronic anticoagulation  Hx of Afib, CVA, DVT/PE, IVC filter in place Hx of HIT - INR 2.0 - continue home coumadin   I will arrange  cardiology follow up.     Risk Assessment/Risk Scores:    CHA2DS2-VASc Score = 6 This indicates a 9.7% annual risk of stroke. The patient's score is based upon: CHF History: Yes HTN History: Yes Diabetes History: No Stroke History: Yes Vascular Disease History: No Age Score: 1 Gender Score: 1       For questions or updates, please contact [place] Please consult [email or web address] for contact info under    Signed, Ledora Bottcher, [place]  09/04/2020 4:29 PM  Attending Note:   The patient was seen and examined.  Agree with assessment and plan as noted above.  Changes made to the above note as needed.  Patient seen and independently examined with Angie Duke, pa .   We discussed all aspects of the encounter. I agree with the assessment and plan as stated above.     Mechanical fall.   The patient fell today but by her history , this was a mechanical fall related to losing her show on the steps - not a syncopal episode.    2.  Acute on chronic CHF:   EF has improved by echo.  She was found to have mod mitral stenosis.  Mean gradient of 10.5.    this may be responsible for her CHF symptoms  She is better after lasix.  She will likely be discharged tomorrow.  Will have her follow up with Dr. Percival Spanish .  She will need a TEE as an outpatient       I have spent a total of 40 minutes with patient reviewing hospital  notes , telemetry, EKGs, labs and examining patient as well as establishing an assessment and plan that was discussed with the patient.  > 50% of time was spent in direct patient care.    Thayer Headings, Brooke Bonito., MD, [facility] 09/04/2020, 4:42 PM [address]  [place] Pager [phone]

## 2020-09-04 NOTE — Plan of Care (Signed)

## 2020-09-04 NOTE — H&P (Signed)
History and Physical    [name] NUU:[id number] DOB: [birth year] DOA: 09/03/2020  Referring MD/NP/PA: Addison Lank, MD PCP: Josetta Huddle, MD  Patient coming from: Home  Chief Complaint: Fall  I have personally briefly reviewed patient's old medical records in [place]   HPI: [name] is a 75 y.o. female with medical history significant of hypertension, systolic congestive heart failure last EF 40 to 45%, permanent atrial fibrillation on anticoagulation,, right MCA stroke with hemorrhagic conversion in 2012, history of DVT and PE, and HIT presents after having a fall at home.  Patient was going up 2 steps into her house when her shoe came off and caused her to fall and land on her buttocks.  With ambulation patient reports having significant pain in her tailbone.  She denies any loss of consciousness or trauma to to her head.  She chronically reports having lower extremity swelling mostly on the left leg and insomnia.  Denies having any significant cough, shortness of breath, chest pain, nausea, vomiting, diarrhea, or recent sick contacts.  Patient normally does not require oxygen at baseline.  ED Course: On admission into the emergency department patient was seen to be afebrile, pulse 87-1 14, respirations 18-29, blood pressure 102/69-183/153, and O2 saturations as low as 88% on room air with improvement on 2 L of nasal cannula oxygen.  CT scan of the head did not note any acute abnormalities.  Labs from 6/9 significant for WBC 12.3, glucose 180, calcium 8.5, and INR 1.8.  Chest x-ray significant for mild to moderate cardiomegaly with development of pulmonary vascular congestion.  X-ray imaging of the lumbar spine noted mild diffuse degenerative disc disease.  X-rays of the hips did not note any acute abnormality.  Patient had been given metoprolol 5 mg IV, morphine 4 mg IV, furosemide 40 mg IV, Tylenol 1000 mg, Coumadin 1 mg, and restarted home medications of metoprolol 25 mg  p.o. twice daily.  TRH called admit for suspected congestive heart failure exacerbation with new oxygen requirement.  Review of Systems  Constitutional:  Negative for fever and malaise/fatigue.  Eyes:  Negative for photophobia and pain.  Respiratory:  Negative for cough and shortness of breath.   Cardiovascular:  Positive for leg swelling. Negative for chest pain.  Gastrointestinal:  Negative for abdominal pain, nausea and vomiting.  Genitourinary:  Negative for dysuria and frequency.  Musculoskeletal:  Positive for falls and joint pain.  Neurological:  Negative for loss of consciousness and headaches.  Psychiatric/Behavioral:  Negative for substance abuse. The patient has insomnia.    Past Medical History:  Diagnosis Date   (HFpEF) heart failure with preserved ejection fraction ([place])    a. 06/2008 Echo: EF 55-60%, mild LVH, triv AI, mild to mod MS, mod to sev dil LA, mildly dil RA.   Acute right MCA stroke ([place])    a. 05/30/7423 Embolic stroke treated with TPA and thrombectomy with hemorrhagic transformation; Carotids 3/12 negative for ICA stenosis.   Bilateral Pulmonary Emboli    a. 08/2005 - s/p IVC filter.   Depression    Embolus of femoral artery ([place])    a. 06/2008 s/p bilateral embolectomies.   History of DVT (deep vein thrombosis)    a. s/p IVC filter.   HIT (heparin-induced thrombocytopenia) (HCC)    HTN (hypertension)    Lupus anticoagulant disorder (HCC)    Obesity, unspecified    Permanent atrial fibrillation ([place])    a. On chronic Coumadin - INRs followed by PCP; b.  CHA2DS2VASc = 5.   Popliteal artery embolism, right ([place])    a. 01/2017 in the setting of subRx INR-->s/p embolectomy.    Past Surgical History:  Procedure Laterality Date   distal radius fracture. (12,/16/2010)     EMBOLECTOMY Right 02/15/2017   Procedure: EMBOLECTOMY RIGHT LEG;  Surgeon: Elam Dutch, MD;  Location: [facility];  Service: Vascular;  Laterality: Right;   I & D EXTREMITY Right 03/05/2017    Procedure: IRRIGATION AND DEBRIDEMENT RIGHT LEG HEMATOMA EVACUATION;  Surgeon: Elam Dutch, MD;  Location: [facility];  Service: Vascular;  Laterality: Right;   IVC Placement 08/30/2005     Open reduction and internal fixation of left intra-articular       reports that she has never smoked. She has never used smokeless tobacco. She reports that she does not drink alcohol and does not use drugs.  Allergies  Allergen Reactions   Heparin Hives    Patient attests severe allergy- rash all over, severe itching, unable to take heparin   Other Itching and Rash    "Sheets and Linens used by Zacarias Pontes"    Family History  Problem Relation Age of Onset   Coronary artery disease Other    Diabetes Other     Prior to Admission medications   Medication Sig Start Date End Date Taking? Authorizing Provider  acetaminophen (TYLENOL) 500 MG tablet Take 1,000 mg by mouth every 6 (six) hours as needed for mild pain.   Yes [provider]  Ascorbic Acid (VITA-C PO) Take 500 mg by mouth daily.   Yes [provider]  aspirin 81 MG tablet Take 81 mg by mouth daily.     Yes [provider]  cetirizine (ZYRTEC) 10 MG tablet Take 10 mg by mouth daily as needed for allergies.   Yes [provider]  Cholecalciferol (VITAMIN D3) 125 MCG (5000 UT) CAPS Take 5,000 Units by mouth daily.   Yes [provider]  FLUoxetine (PROZAC) 20 MG capsule Take 20 mg by mouth daily. 04/12/17  Yes [provider]  furosemide (LASIX) 40 MG tablet Take 1 tablet (40 mg total) by mouth daily. 03/06/19  Yes Lendon Colonel, NP  losartan (COZAAR) 100 MG tablet Take 1 tablet by mouth once daily 08/06/20  Yes Lendon Colonel, NP  metoprolol tartrate (LOPRESSOR) 25 MG tablet Take 1 tablet by mouth twice daily Patient taking differently: Take 25 mg by mouth 2 (two) times daily. 02/21/20  Yes Hochrein, Jeneen Rinks, MD  potassium chloride (K-DUR) 10 MEQ tablet TAKE 1 TABLET BY MOUTH ONCE  DAILY Patient taking differently: Take 10 mEq by mouth daily. 04/23/18  Yes Kilroy, Luke K, PA-C  warfarin (COUMADIN) 3 MG tablet TAKE 1 & 1/2 TO 2 (ONE & ONE-HALF TO TWO) TABLETS BY MOUTH ONCE DAILY AS DIRECTED BY  COUMADIN  CLINIC Patient taking differently: Take 4.5-6 mg by mouth See admin instructions. 4.5 mg Monday,Tuesday,Thursday,Friday,Saturday and sunday 6 mg wednesday 05/28/20  Yes Hochrein, Jeneen Rinks, MD  lidocaine (LIDODERM) 5 % Place 1 patch onto the skin daily. Remove & Discard patch within 12 hours or as directed by MD Patient not taking: No sig reported 09/19/19   Deliah Boston, PA-C    Physical Exam:  Constitutional: Elderly female currently in no acute distress Vitals:   09/04/20 0552 09/04/20 0711 09/04/20 0720 09/04/20 0727  BP: 121/71 105/83    Pulse: 87 94    Resp: (!) 22 (!) 22    Temp:  98  F (36.7 C)    TempSrc:  Oral    SpO2: 94% 94% (!) 88% 93%   Eyes: PERRL, lids and conjunctivae normal ENMT: Mucous membranes are moist. Posterior pharynx clear of any exudate or lesions.  Neck: normal, supple, no masses, no thyromegaly.  No significant JVD Respiratory: Respiratory rate with crackles appreciated in the bases.  Patient currently O2 saturation maintained on 2 L nasal cannula oxygen Cardiovascular: Irregular irregular, no murmurs / rubs / gallops.  Trace lower extremity edema. 2+ pedal pulses. No carotid bruits.  Abdomen: no tenderness, no masses palpated. No hepatosplenomegaly. Bowel sounds positive.  Musculoskeletal: no clubbing / cyanosis. No joint deformity upper and lower extremities. Good ROM, no contractures. Normal muscle tone.  Skin: no rashes, lesions, ulcers. No induration Neurologic: CN 2-12 grossly intact. Sensation intact, DTR normal. Strength 5/5 in all 4.  Psychiatric: Normal judgment and insight. Alert and oriented x 3. Normal mood.     Labs on Admission: I have personally reviewed following labs and imaging studies  CBC: Recent Labs  Lab  09/03/20 2234  WBC 12.3*  HGB 14.2  HCT 44.8  MCV 101.1*  PLT 211   Basic Metabolic Panel: Recent Labs  Lab 09/03/20 2221  NA 135  K 4.2  CL 101  CO2 23  GLUCOSE 180*  BUN 12  CREATININE 0.91  CALCIUM 8.5*   GFR: CrCl cannot be calculated (Unknown ideal weight.). Liver Function Tests: No results for input(s): AST, ALT, ALKPHOS, BILITOT, PROT, ALBUMIN in the last 168 hours. No results for input(s): LIPASE, AMYLASE in the last 168 hours. No results for input(s): AMMONIA in the last 168 hours. Coagulation Profile: Recent Labs  Lab 09/03/20 2256  INR 1.8*   Cardiac Enzymes: No results for input(s): CKTOTAL, CKMB, CKMBINDEX, TROPONINI in the last 168 hours. BNP (last 3 results) No results for input(s): PROBNP in the last 8760 hours. HbA1C: No results for input(s): HGBA1C in the last 72 hours. CBG: No results for input(s): GLUCAP in the last 168 hours. Lipid Profile: No results for input(s): CHOL, HDL, LDLCALC, TRIG, CHOLHDL, LDLDIRECT in the last 72 hours. Thyroid Function Tests: No results for input(s): TSH, T4TOTAL, FREET4, T3FREE, THYROIDAB in the last 72 hours. Anemia Panel: No results for input(s): VITAMINB12, FOLATE, FERRITIN, TIBC, IRON, RETICCTPCT in the last 72 hours. Urine analysis:    Component Value Date/Time   COLORURINE YELLOW 03/24/2017 1856   APPEARANCEUR HAZY (A) 03/24/2017 1856   LABSPEC 1.012 03/24/2017 1856   PHURINE 5.0 03/24/2017 1856   GLUCOSEU NEGATIVE 03/24/2017 1856   HGBUR MODERATE (A) 03/24/2017 1856   BILIRUBINUR NEGATIVE 03/24/2017 1856   KETONESUR NEGATIVE 03/24/2017 1856   PROTEINUR NEGATIVE 03/24/2017 1856   UROBILINOGEN 1.0 07/07/2010 1500   NITRITE NEGATIVE 03/24/2017 1856   LEUKOCYTESUR LARGE (A) 03/24/2017 1856   Sepsis Labs: No results found for this or any previous visit (from the past 240 hour(s)).   Radiological Exams on Admission: DG Lumbar Spine Complete  Result Date: 09/04/2020 CLINICAL DATA:  Fall, back pain  EXAM: LUMBAR SPINE - COMPLETE 4+ VIEW COMPARISON:  None. FINDINGS: Normal lumbar lordosis. No acute fracture or listhesis of the lumbar spine. Vertebral body height is preserved. There is diffuse mild intervertebral disc space narrowing and endplate remodeling throughout the lumbar spine in keeping with changes of diffuse mild degenerative disc disease. Fracture TrapEase inferior vena cava filter again noted in the expected infrarenal cava. The paraspinal soft tissues are otherwise unremarkable. IMPRESSION: Mild diffuse degenerative disc disease. No  acute fracture or listhesis. Electronically Signed   By: Fidela Salisbury MD   On: 09/04/2020 01:15   CT Head Wo Contrast  Result Date: 09/04/2020 CLINICAL DATA:  Head injury, fall EXAM: CT HEAD WITHOUT CONTRAST TECHNIQUE: Contiguous axial images were obtained from the base of the skull through the vertex without intravenous contrast. COMPARISON:  None. FINDINGS: Brain: Normal anatomic configuration. Parenchymal volume loss is commensurate with the patient's age. Mild periventricular white matter changes are present likely reflecting the sequela of small vessel ischemia. 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 There is focal encephalomalacia within the right basal ganglia, caudate body and corona radiata related to intraparenchymal hematoma noted on 06/03/2010, unchanged from prior examination. Tiny remote lacunar infarct noted within the right cerebellar hemisphere. No abnormal intra or extra-axial mass lesion or fluid collection. No abnormal mass effect or midline shift. No evidence of acute intracranial hemorrhage or infarct. Mild ex vacuo dilation of the right lateral ventricle. Ventricular size is otherwise normal. Cerebellum unremarkable. Vascular: No asymmetric hyperdense vasculature at the skull base. Skull: Intact Sinuses/Orbits: Paranasal sinuses are clear. Orbits are unremarkable. Other: Mastoid air cells and middle ear  cavities are clear. IMPRESSION: No acute intracranial injury.  No calvarial fracture. Stable mild senescent change and remote infarcts as outlined above. Electronically Signed   By: Fidela Salisbury MD   On: 09/04/2020 01:13   DG Chest Portable 1 View  Result Date: 09/04/2020 CLINICAL DATA:  Fall, chest injury EXAM: PORTABLE CHEST 1 VIEW COMPARISON:  03/28/2017 FINDINGS: The left costophrenic angle is excluded from view. The lungs are symmetrically expanded. Mild to moderate cardiomegaly is again noted. There is central pulmonary vascular congestion in keeping with early cardiogenic failure or mild volume overload, new since prior examination. No superimposed overt pulmonary edema. No pneumothorax or definite pleural effusion. No acute bone abnormality. IMPRESSION: Technically limited examination without radiographic evidence of acute cardiopulmonary disease. Stable mild-to-moderate cardiomegaly with development of pulmonary vascular congestion Electronically Signed   By: Fidela Salisbury MD   On: 09/04/2020 01:09   DG HIP UNILAT WITH PELVIS 2-3 VIEWS LEFT  Result Date: 09/04/2020 CLINICAL DATA:  Fall, left hip pain EXAM: DG HIP (WITH OR WITHOUT PELVIS) 2-3V LEFT COMPARISON:  None. FINDINGS: Normal alignment. No fracture or dislocation. Mild left hip degenerative arthritis. Surgical clips are seen surrounding the left hip. IMPRESSION: No acute fracture or dislocation. Electronically Signed   By: Fidela Salisbury MD   On: 09/04/2020 01:18   DG HIP UNILAT WITH PELVIS 2-3 VIEWS RIGHT  Result Date: 09/04/2020 CLINICAL DATA:  Fall buttock pain EXAM: DG HIP (WITH OR WITHOUT PELVIS) 2-3V RIGHT COMPARISON:  09/19/2019 FINDINGS: Single view radiograph of the pelvis and two view radiograph of the right hip demonstrates normal alignment. No fracture or dislocation. Mild right hip degenerative arthritis, stable since prior examination. Multiple surgical clips noted surrounding the left hip. IMPRESSION: No acute fracture  or dislocation. Electronically Signed   By: Fidela Salisbury MD   On: 09/04/2020 01:17    EKG: Independently reviewed.  Atrial fibrillation at 109 bpm  Assessment/Plan Acute respiratory failure with hypoxia secondary to systolic congestive heart failure exacerbation: Patient was incidentally found to be hypoxic with O2 saturations as low as 88% on room air.  Trace lower extremity edema noted without significant JVD appreciated chest x-ray significant for mild to moderate cardiomegaly with pulmonary vascular congestion. Last echocardiogram revealed EF of 40 to 45% back in 2018.  Suspect patient just may  be mildly fluid overloaded. -Admit to a cardiac telemetry bed -Heart failure orders set  initiated  -Continuous pulse oximetry with nasal cannula oxygen as needed to keep O2 saturations >92% -Strict I&Os and daily weights -Elevate lower extremities -Check BNP and TSH -Lasix 40 mg IV x1 additional dose at 18:00  -Reassess in a.m. and adjust diuresis as needed. -Check echocardiogram -Continue beta-blocker and ARB -Cardiology notified of the patient admission into the hospital, follow-up for further recommendations  Permanent atrial fibrillation  subtherapeutic INR: Patient presented in atrial fibrillation with heart rates into the 110s.  Rate control was achieved after patient had been given metoprolol IV and restarted on home regimen.  Initial INR was noted to have subtherapeutic at 1.8.  Goal INR 2-3.  CHA2DS2-VASc score equal to at least 5.  -Coumadin per pharmacy -Continue metoprolol  Leukocytosis: Acute.  WBC elevated at 12.3. -Check urinalysis -Recheck CBC tomorrow morning  Fall at home: Patient fell while trying to go up 2 steps back into her home and landed on her buttocks.  No acute fractures noted on x-ray imaging of the hips and lumbar spine. -Tramadol as needed for pain -ED/OT to evaluate and treat  Essential hypertension: Home blood pressure medications include furosemide 40 mg  daily, losartan 100 mg daily, and metoprolol 25 mg twice daily -Continue metoprolol and losartan  Hyperglycemia: Acute.  Patient glucose initially noted to be 180 on admission.  Records note patient was prediabetic with last hemoglobin A1c was 6.3 back in 2012. -Check hemoglobin A1c  History of CVA history of PE and DVT -Continue aspirin and Coumadin  History of HIT  Morbid obesity: BMI 40.71 kg/m   DVT prophylaxis: Coumadin Code Status: Full Family Communication: Daughter updated over the phone Disposition Plan: Hopefully discharge home once medically stable Consults called: Cardiology Admission status: Inpatient, require more than 2 midnight stay for IV diuresis  Norval Morton MD [facility]   If 7PM-7AM, please contact night-coverage   09/04/2020, 8:35 AM

## 2020-09-04 NOTE — ED Provider Notes (Signed)
I assumed care of this patient.  Please see previous provider note for further details of Hx, PE.  Briefly patient is a 75 y.o. female who presented after mechanical fall.  Patient on Coumadin.  Currently pending labs and imaging.  Chest x-ray notable for cardiomegaly and pulmonary vascular congestion. CT head without ICH or SAH. Plain films of the lumbar and bilateral hips negative for any acute fractures. Labs reassuring. Patient is mildly subtherapeutic with INR.  On my reassessment and patient was tachycardic in A. fib RVR. She had previously been given 5 mg of Lopressor. Did not take her nighttime metoprolol medicine. Patient was given her metoprolol and Coumadin.  Patient was monitored for several hours. Heart rate improved. On reassessment patient was noted to be mildly hypoxic with sats of 88 on room air while at rest.good waveform. Given lasix for HF.  Patient's last echo was in 2018 and had an EF of 40 to 45%.  Despite pain medicine, patient with difficulty getting out of bed to ambulate.  Given the oxygen requirement and persistent pain.  We will admit patient for further work-up and management.       Fatima Blank, MD 09/04/20 [phone]

## 2020-09-04 NOTE — Progress Notes (Signed)
ANTICOAGULATION CONSULT NOTE - Initial Consult  Pharmacy Consult for Warfarin Indication: AF, History of PE, popliteal artery embolus  Allergies  Allergen Reactions   Heparin Hives    Patient attests severe allergy- rash all over, severe itching, unable to take heparin   Other Itching and Rash    "Sheets and Linens used by Zacarias Pontes"   Patient Measurements:   Vital Signs: Temp: 98 F (36.7 C) (06/10 0711) Temp Source: Oral (06/10 0711) BP: 126/73 (06/10 0915) Pulse Rate: 79 (06/10 0915)  Labs: Recent Labs    09/03/20 2221 09/03/20 2234 09/03/20 2256  HGB  --  14.2  --   HCT  --  44.8  --   PLT  --  196  --   LABPROT  --   --  21.3*  INR  --   --  1.8*  CREATININE 0.91  --   --     CrCl cannot be calculated (Unknown ideal weight.).   Medical History: Past Medical History:  Diagnosis Date   (HFpEF) heart failure with preserved ejection fraction ([place])    a. 06/2008 Echo: EF 55-60%, mild LVH, triv AI, mild to mod MS, mod to sev dil LA, mildly dil RA.   Acute right MCA stroke ([place])    a. 05/06/7679 Embolic stroke treated with TPA and thrombectomy with hemorrhagic transformation; Carotids 3/12 negative for ICA stenosis.   Bilateral Pulmonary Emboli    a. 08/2005 - s/p IVC filter.   Depression    Embolus of femoral artery ([place])    a. 06/2008 s/p bilateral embolectomies.   History of DVT (deep vein thrombosis)    a. s/p IVC filter.   HIT (heparin-induced thrombocytopenia) (HCC)    HTN (hypertension)    Lupus anticoagulant disorder (HCC)    Obesity, unspecified    Permanent atrial fibrillation ([place])    a. On chronic Coumadin - INRs followed by PCP; b. CHA2DS2VASc = 5.   Popliteal artery embolism, right ([place])    a. 01/2017 in the setting of subRx INR-->s/p embolectomy.   Medications:  (Not in a hospital admission)  Scheduled:   aspirin EC  81 mg Oral Daily   FLUoxetine  20 mg Oral Daily   furosemide  40 mg Intravenous BID   losartan  100 mg Oral Daily    metoprolol tartrate  25 mg Oral BID   potassium chloride  10 mEq Oral Daily   sodium chloride flush  3 mL Intravenous Q12H   Assessment: 75 yo female taking warfarin (Indication: AF, History of PE, popliteal artery embolus). INR Goal 2.5-3.5 per last anticoag visit May '22.  Last dose warfarin prior to arrival 6/8 per med history. Home dose: 6 mg (3 mg x 2) every Wed; 4.5 mg (3 mg x 1.5) all other days.  INR at baseline (6/9 2256) was subtherapeutic at 1.8. INR this morning remains subtherapeutic at 2.  H/H 14.2/44.8 which is baseline. CT head negative post fall.  Of note: patient received 1mg  warfarin 6/10 0318 in ED.  Goal of Therapy:  INR 2.5-3.5 Monitor CBC and INR   Plan:  Warfarin 5mg  today (this plus the 1mg  given overnight will total 6mg ) Follow up with daily INR  Lorelei Pont, PharmD, BCPS 09/04/2020 9:34 AM ED Clinical Pharmacist -  [phone]

## 2020-09-05 DIAGNOSIS — J9601 Acute respiratory failure with hypoxia: Secondary | ICD-10-CM

## 2020-09-05 DIAGNOSIS — I4821 Permanent atrial fibrillation: Secondary | ICD-10-CM

## 2020-09-05 DIAGNOSIS — I5023 Acute on chronic systolic (congestive) heart failure: Secondary | ICD-10-CM

## 2020-09-05 DIAGNOSIS — M533 Sacrococcygeal disorders, not elsewhere classified: Secondary | ICD-10-CM

## 2020-09-05 DIAGNOSIS — I10 Essential (primary) hypertension: Secondary | ICD-10-CM

## 2020-09-05 LAB — CBC
HCT: 40.7 % (ref 36.0–46.0)
Hemoglobin: 13.5 g/dL (ref 12.0–15.0)
MCH: 32.2 pg (ref 26.0–34.0)
MCHC: 33.2 g/dL (ref 30.0–36.0)
MCV: 97.1 fL (ref 80.0–100.0)
Platelets: 166 10*3/uL (ref 150–400)
RBC: 4.19 MIL/uL (ref 3.87–5.11)
RDW: 13.2 % (ref 11.5–15.5)
WBC: 10 10*3/uL (ref 4.0–10.5)
nRBC: 0 % (ref 0.0–0.2)

## 2020-09-05 LAB — BASIC METABOLIC PANEL
Anion gap: 11 (ref 5–15)
BUN: 13 mg/dL (ref 8–23)
CO2: 31 mmol/L (ref 22–32)
Calcium: 8.7 mg/dL — ABNORMAL LOW (ref 8.9–10.3)
Chloride: 92 mmol/L — ABNORMAL LOW (ref 98–111)
Creatinine, Ser: 1.05 mg/dL — ABNORMAL HIGH (ref 0.44–1.00)
GFR, Estimated: 56 mL/min — ABNORMAL LOW (ref >60)
Glucose, Bld: 122 mg/dL — ABNORMAL HIGH (ref 70–99)
Potassium: 4 mmol/L (ref 3.5–5.1)
Sodium: 134 mmol/L — ABNORMAL LOW (ref 135–145)

## 2020-09-05 LAB — HEMOGLOBIN A1C
Hgb A1c MFr Bld: 6 % — ABNORMAL HIGH (ref 4.8–5.6)
Mean Plasma Glucose: 126 mg/dL

## 2020-09-05 LAB — PROTIME-INR
INR: 2 — ABNORMAL HIGH (ref 0.8–1.2)
Prothrombin Time: 22.3 seconds — ABNORMAL HIGH (ref 11.4–15.2)

## 2020-09-05 MED ORDER — WARFARIN SODIUM 3 MG PO TABS
6.0000 mg | ORAL_TABLET | Freq: Once | ORAL | Status: AC
  Administered 2020-09-05: 6 mg via ORAL
  Filled 2020-09-05: qty 2

## 2020-09-05 MED ORDER — FUROSEMIDE 40 MG PO TABS
40.0000 mg | ORAL_TABLET | Freq: Every day | ORAL | Status: DC
  Administered 2020-09-05 – 2020-09-08 (×4): 40 mg via ORAL
  Filled 2020-09-05 (×4): qty 1

## 2020-09-05 NOTE — Progress Notes (Signed)
ANTICOAGULATION CONSULT NOTE - Initial Consult  Pharmacy Consult for Warfarin Indication: AF, History of PE, popliteal artery embolus  Allergies  Allergen Reactions   Heparin Hives    Patient attests severe allergy- rash all over, severe itching, unable to take heparin   Other Itching and Rash    "Sheets and Linens used by Zacarias Pontes"   Patient Measurements: Height: 5\' 6"  (167.6 cm) Weight: 112.3 kg (247 lb 8 oz) IBW/kg (Calculated) : 59.3 Vital Signs: Temp: 98.1 F (36.7 C) (06/11 0748) Temp Source: Oral (06/11 0748) BP: 147/74 (06/11 0748) Pulse Rate: 90 (06/11 0748)  Labs: Recent Labs    09/03/20 2221 09/03/20 2234 09/03/20 2256 09/04/20 0907 09/05/20 0254  HGB  --  14.2  --   --  13.5  HCT  --  44.8  --   --  40.7  PLT  --  196  --   --  166  LABPROT  --   --  21.3* 22.3* 22.3*  INR  --   --  1.8* 2.0* 2.0*  CREATININE 0.91  --   --   --  1.05*    Estimated Creatinine Clearance: 59.7 mL/min (A) (by C-G formula based on SCr of 1.05 mg/dL (H)).  Assessment: 75 yo female taking warfarin (Indication: AF, History of PE, popliteal artery embolus). INR Goal 2.5-3.5 per last anticoag visit May '22. INR on admit (6/9) was subtherapeutic at 1.8.  INR continues to be subtherapeutic at 2.0. CBC stable/WNL.  Home dose: 6 mg (3 mg x 2) every Wed; 4.5 mg (3 mg x 1.5) all other days.  Goal of Therapy:  INR 2.5-3.5 Monitor CBC and INR   Plan:  Warfarin 6 mg x1 Daily PT/INR  Romilda Garret, PharmD PGY1 Acute Care Pharmacy Resident 09/05/2020 8:08 AM  Please check [email or web address] for unit specific pharmacy phone numbers.

## 2020-09-05 NOTE — Plan of Care (Signed)
  Problem: Clinical Measurements: Goal: Ability to maintain clinical measurements within normal limits will improve Outcome: Progressing   Problem: Clinical Measurements: Goal: Respiratory complications will improve Outcome: Progressing   Problem: Activity: Goal: Risk for activity intolerance will decrease Outcome: Progressing   

## 2020-09-05 NOTE — Evaluation (Signed)
Physical Therapy Evaluation Patient Details Name: [name] MRN: [id number] DOB: [birth year] Today's Date: 09/05/2020   History of Present Illness  Pt is a 75 y/o female admitted 09/03/20 presenting after fall at home. CT head negative. X-ray spine/hips with no acute abnormality. Admitted for acute respiratory failure with hypoxia due to systolic CHF exacerbation. PMH includeS: HTN, systolic CHF, Afib, R MCA stroke with hemorrhagic conversion, hx of DVT/PE, and HIT.  Clinical Impression  Pt was seen for mobility on RW with struggle to move and control her body mechanics.  Due to body habitus, has a lot of strain on back to move and is feeling residual L hemiparesis from her stroke.  Follow along with pt to teach safety with moving, to increase tolerance for sitting and to work on LE strength as well as strategies to stand up with better control of hips.  Pt is currently not helping to roll or scoot up in the bed, and is quite dependent.  Recommending SNF rehab due to her inability to move herself, and unsafe technique that she tends to use which strains low back by trying to pull forward.      Follow Up Recommendations SNF    Equipment Recommendations  None recommended by PT    Recommendations for Other Services       Precautions / Restrictions Precautions Precautions: Fall Precaution Comments: back pain could be treated as precautions for comfort Restrictions Weight Bearing Restrictions: No  Monitor O2 sats     Mobility  Bed Mobility Overal bed mobility: Needs Assistance Bed Mobility: Rolling;Sidelying to Sit;Sit to Sidelying Rolling: Mod assist Sidelying to sit: Total assist     Sit to sidelying: Total assist General bed mobility comments: pt was able to let PT support her with bed pad to get legs to side, but actively resists getting trunk off the bed.  Pain is her reason to resist    Transfers                 General transfer comment:  resisted  Ambulation/Gait                Stairs            Wheelchair Mobility    Modified Rankin (Stroke Patients Only)       Balance Overall balance assessment: History of Falls;Needs assistance   Sitting balance-Leahy Scale: Zero                                       Pertinent Vitals/Pain Pain Assessment: Faces Faces Pain Scale: Hurts whole lot Pain Location: lower sacrum Pain Descriptors / Indicators: Grimacing;Guarding Pain Intervention(s): Limited activity within patient's tolerance;Monitored during session;Repositioned;Premedicated before session    Home Living Family/patient expects to be discharged to:: Private residence Living Arrangements: Alone Available Help at Discharge: Family;Available PRN/intermittently Type of Home: House Home Access: Stairs to enter Entrance Stairs-Rails: Right Entrance Stairs-Number of Steps: 2 Home Layout: One level Home Equipment: Grab bars - tub/shower;Walker - 2 wheels;Cane - single point;Bedside commode;Shower seat      Prior Function Level of Independence: Independent with assistive device(s)         Comments: SPC, driving     Hand Dominance   Dominant Hand: Right    Extremity/Trunk Assessment   Upper Extremity Assessment Upper Extremity Assessment: Defer to OT evaluation    Lower Extremity Assessment Lower Extremity Assessment: Generalized weakness (  pain is impacting her strength)    Cervical / Trunk Assessment Cervical / Trunk Assessment: Other exceptions (low back pain on sacrum from her fall, imaging (-))  Communication   Communication: No difficulties  Cognition Arousal/Alertness: Awake/alert Behavior During Therapy: Anxious Overall Cognitive Status: Within Functional Limits for tasks assessed                                 General Comments: fearful of pain, actively resisting out of fear      General Comments General comments (skin integrity, edema,  etc.): Pt has pain centering on sacrum, and when PT tried to move her, she was unable to tolerate it.  Two or more assist would not benefit her today due to pain, and will need lift to move by nursing    Exercises     Assessment/Plan    PT Assessment Patient needs continued PT services  PT Problem List Decreased strength;Decreased range of motion;Decreased activity tolerance;Decreased balance;Decreased mobility;Decreased coordination;Decreased knowledge of use of DME;Pain       PT Treatment Interventions DME instruction;Gait training;Functional mobility training;Therapeutic activities;Therapeutic exercise;Balance training;Neuromuscular re-education;Patient/family education;Stair training    PT Goals (Current goals can be found in the Care Plan section)  Acute Rehab PT Goals Patient Stated Goal: less pain PT Goal Formulation: With patient Time For Goal Achievement: 09/19/20 Potential to Achieve Goals: Fair    Frequency Min 3X/week   Barriers to discharge Inaccessible home environment;Decreased caregiver support home with limited help    Co-evaluation               AM-PAC PT "6 Clicks" Mobility  Outcome Measure Help needed turning from your back to your side while in a flat bed without using bedrails?: A Lot Help needed moving from lying on your back to sitting on the side of a flat bed without using bedrails?: Total Help needed moving to and from a bed to a chair (including a wheelchair)?: Total Help needed standing up from a chair using your arms (e.g., wheelchair or bedside chair)?: Total Help needed to walk in hospital room?: Total Help needed climbing 3-5 steps with a railing? : Total 6 Click Score: 7    End of Session Equipment Utilized During Treatment: Gait belt;Oxygen Activity Tolerance: Patient limited by pain Patient left: in bed;with call bell/phone within reach;with bed alarm set Nurse Communication: Mobility status PT Visit Diagnosis: Muscle weakness  (generalized) (M62.81);Pain;Difficulty in walking, not elsewhere classified (R26.2) Pain - Right/Left:  (back) Pain - part of body:  (back)    Time: 0109-3235 PT Time Calculation (min) (ACUTE ONLY): 16 min   Charges:   PT Evaluation $PT Eval Moderate Complexity: 1 Mod         Ramond Dial 09/05/2020, 12:47 PM  Mee Hives, PT MS Acute Rehab Dept. Number: [place] [facility] [place]

## 2020-09-05 NOTE — Evaluation (Signed)
Occupational Therapy Evaluation Patient Details Name: [name] MRN: [id number] DOB: 1945-07-07 Today's Date: 09/05/2020    History of Present Illness Pt is a 75 y/o female admitted 09/03/20 presenting after fall at home. CT head negative. X-ray spine/hips with no acute abnormality. Admitted for acute respiratory failure with hypoxia due to systolic CHF exacerbation. PMH includeS: HTN, systolic CHF, Afib, R MCA stroke with hemorrhagic conversion, hx of DVT/PE, and HIT.   Clinical Impression   PTA patient reports independent with ADLs, IADLs and mobility using cane as needed. Admitted for above and presenting with problem list below, including pain, weakness, decreased activity tolerance.  She currently requires up to total assist for LB ADLs, min assist for UB ADLs; attempted EOB but unable to fully achieve sitting EOB due to buttocks pain and pt posteriorly pushing against therapist once partially upright--will need +2 assist.  Based on performance today, patient will benefit from continued OT services while admitted and after dc at SNF level to optimize independence and safety with ADLs, mobility.     Follow Up Recommendations  SNF;Supervision/Assistance - 24 hour    Equipment Recommendations  3 in 1 bedside commode    Recommendations for Other Services       Precautions / Restrictions Precautions Precautions: Fall Restrictions Weight Bearing Restrictions: No      Mobility Bed Mobility Overal bed mobility: Needs Assistance Bed Mobility: Rolling;Sidelying to Sit;Sit to Sidelying Rolling: Mod assist Sidelying to sit: Max assist;HOB elevated (unable to come completely to sit)     Sit to sidelying: Max assist General bed mobility comments: cueing for technique, towards R side with min assist for LB towards EOB and rolling trunk given max assist to attempt ascend of trunk to sittting but unable to complete transition due to pain in buttocks and pt pushing posteriorly backwards;  returned to supine with max assist and positioned upright in bed    Transfers                 General transfer comment: unable    Balance                                           ADL either performed or assessed with clinical judgement   ADL Overall ADL's : Needs assistance/impaired     Grooming: Set up;Bed level   Upper Body Bathing: Minimal assistance;Bed level   Lower Body Bathing: Maximal assistance;Bed level   Upper Body Dressing : Moderate assistance;Bed level   Lower Body Dressing: Total assistance;Bed level     Toilet Transfer Details (indicate cue type and reason): unable         Functional mobility during ADLs: Maximal assistance (bed mobility only) General ADL Comments: pt limited by pain, weakness and activity tolerance     Vision Baseline Vision/History: Wears glasses Wears Glasses: Reading only Patient Visual Report: No change from baseline Vision Assessment?: No apparent visual deficits     Perception     Praxis      Pertinent Vitals/Pain Pain Assessment: Faces Faces Pain Scale: Hurts even more Pain Location: buttocks Pain Descriptors / Indicators: Discomfort;Grimacing;Guarding Pain Intervention(s): Limited activity within patient's tolerance;Monitored during session;Repositioned     Hand Dominance Right   Extremity/Trunk Assessment Upper Extremity Assessment Upper Extremity Assessment: Generalized weakness   Lower Extremity Assessment Lower Extremity Assessment: Defer to PT evaluation       Communication  Communication Communication: No difficulties   Cognition Arousal/Alertness: Awake/alert Behavior During Therapy: WFL for tasks assessed/performed Overall Cognitive Status: Within Functional Limits for tasks assessed                                     General Comments  1L O2    Exercises     Shoulder Instructions      Home Living Family/patient expects to be discharged to::  Private residence Living Arrangements: Alone Available Help at Discharge: Family;Available PRN/intermittently (daughter and sister) Type of Home: House Home Access: Stairs to enter [employer] of Steps: 2 Entrance Stairs-Rails: Right Home Layout: One level     Bathroom Shower/Tub: Teacher, early years/pre: Standard     Home Equipment: Grab bars - tub/shower;Walker - 2 wheels;Cane - single point;Bedside commode;Shower seat          Prior Functioning/Environment Level of Independence: Independent with assistive device(s)        Comments: uses cane for mobility as needed, independent with ADLs, IADLs (drives)        OT Problem List: Decreased strength;Impaired balance (sitting and/or standing);Decreased activity tolerance;Decreased safety awareness;Decreased knowledge of use of DME or AE;Decreased knowledge of precautions;Cardiopulmonary status limiting activity;Pain      OT Treatment/Interventions: Self-care/ADL training;Therapeutic exercise;DME and/or AE instruction;Therapeutic activities;Patient/family education;Balance training    OT Goals(Current goals can be found in the care plan section) Acute Rehab OT Goals Patient Stated Goal: less pain OT Goal Formulation: With patient Time For Goal Achievement: 09/19/20 Potential to Achieve Goals: Good  OT Frequency: Min 2X/week   Barriers to D/C:            Co-evaluation              AM-PAC OT "6 Clicks" Daily Activity     Outcome Measure Help from another person eating meals?: None Help from another person taking care of personal grooming?: A Little Help from another person toileting, which includes using toliet, bedpan, or urinal?: Total Help from another person bathing (including washing, rinsing, drying)?: A Lot Help from another person to put on and taking off regular upper body clothing?: A Little Help from another person to put on and taking off regular lower body clothing?: Total 6  Click Score: 14   End of Session Equipment Utilized During Treatment: Oxygen Nurse Communication: Mobility status  Activity Tolerance: Patient limited by pain Patient left: in bed;with call bell/phone within reach;with bed alarm set  OT Visit Diagnosis: Other abnormalities of gait and mobility (R26.89);Muscle weakness (generalized) (M62.81);History of falling (Z91.81);Pain Pain - part of body:  (buttocks)                Time: 2620-3559 OT Time Calculation (min): 17 min Charges:  OT General Charges $OT Visit: 1 Visit OT Evaluation $OT Eval Moderate Complexity: 1 Mod  Jolaine Artist, OT Acute Rehabilitation Services Pager [phone] Office [phone]   Delight Stare 09/05/2020, 9:10 AM

## 2020-09-05 NOTE — Progress Notes (Signed)
Patient ID: [name], female   DOB: Jun 21, 1945, 75 y.o.   MRN: [id number]    Subjective:  Denies SSCP, palpitations or Dyspnea Unable to roll over or get OOB due to coccyx pain   Objective:  Vitals:   09/05/20 0041 09/05/20 0405 09/05/20 0407 09/05/20 0748  BP: 123/78 115/72  (!) 147/74  Pulse: 82 80  90  Resp: 16 18  16   Temp: 98.4 F (36.9 C) 98.4 F (36.9 C)  98.1 F (36.7 C)  TempSrc: Oral Oral  Oral  SpO2: 98% 98%  93%  Weight:   112.3 kg   Height:        Intake/Output from previous day:  Intake/Output Summary (Last 24 hours) at 09/05/2020 1036 Last data filed at 09/05/2020 0805 Gross per 24 hour  Intake 720 ml  Output 2150 ml  Net -1430 ml    Physical Exam: Affect appropriate Obese white female  HEENT: normal Neck supple with no adenopathy JVP normal no bruits no thyromegaly Lungs clear with no wheezing and good diaphragmatic motion Heart:  S1/S2 SEM murmur, no rub, gallop or click PMI normal Abdomen: benighn, BS positve, no tenderness, no AAA no bruit.  No HSM or HJR Distal pulses intact with no bruits No edema Neuro non-focal Bruising on coccyx but patient unable to fully roll over to evaluate    Lab Results: Basic Metabolic Panel: Recent Labs    09/03/20 2221 09/05/20 0254  NA 135 134*  K 4.2 4.0  CL 101 92*  CO2 23 31  GLUCOSE 180* 122*  BUN 12 13  CREATININE 0.91 1.05*  CALCIUM 8.5* 8.7*   Liver Function Tests: No results for input(s): AST, ALT, ALKPHOS, BILITOT, PROT, ALBUMIN in the last 72 hours. No results for input(s): LIPASE, AMYLASE in the last 72 hours. CBC: Recent Labs    09/03/20 2234 09/05/20 0254  WBC 12.3* 10.0  HGB 14.2 13.5  HCT 44.8 40.7  MCV 101.1* 97.1  PLT 196 166   Cardiac Enzymes: No results for input(s): CKTOTAL, CKMB, CKMBINDEX, TROPONINI in the last 72 hours. BNP: Invalid input(s): POCBNP D-Dimer: No results for input(s): DDIMER in the last 72 hours. Hemoglobin A1C: Recent Labs     09/04/20 1834  HGBA1C 6.0*   Fasting Lipid Panel: No results for input(s): CHOL, HDL, LDLCALC, TRIG, CHOLHDL, LDLDIRECT in the last 72 hours. Thyroid Function Tests: Recent Labs    09/04/20 1239  TSH 3.381   Anemia Panel: No results for input(s): VITAMINB12, FOLATE, FERRITIN, TIBC, IRON, RETICCTPCT in the last 72 hours.  Imaging: DG Lumbar Spine Complete  Result Date: 09/04/2020 CLINICAL DATA:  Fall, back pain EXAM: LUMBAR SPINE - COMPLETE 4+ VIEW COMPARISON:  None. FINDINGS: Normal lumbar lordosis. No acute fracture or listhesis of the lumbar spine. Vertebral body height is preserved. There is diffuse mild intervertebral disc space narrowing and endplate remodeling throughout the lumbar spine in keeping with changes of diffuse mild degenerative disc disease. Fracture TrapEase inferior vena cava filter again noted in the expected infrarenal cava. The paraspinal soft tissues are otherwise unremarkable. IMPRESSION: Mild diffuse degenerative disc disease. No acute fracture or listhesis. Electronically Signed   By: Fidela Salisbury MD   On: 09/04/2020 01:15   CT Head Wo Contrast  Result Date: 09/04/2020 CLINICAL DATA:  Head injury, fall EXAM: CT HEAD WITHOUT CONTRAST TECHNIQUE: Contiguous axial images were obtained from the base of the skull through the vertex without intravenous contrast. COMPARISON:  None. FINDINGS: Brain: Normal anatomic configuration. Parenchymal  volume loss is commensurate with the patient's age. Mild periventricular white matter changes are present likely reflecting the sequela of small vessel ischemia. 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 There is focal encephalomalacia within the right basal ganglia, caudate body and corona radiata related to intraparenchymal hematoma noted on 06/03/2010, unchanged from prior examination. Tiny remote lacunar infarct noted within the right cerebellar hemisphere. No abnormal intra or extra-axial mass lesion or  fluid collection. No abnormal mass effect or midline shift. No evidence of acute intracranial hemorrhage or infarct. Mild ex vacuo dilation of the right lateral ventricle. Ventricular size is otherwise normal. Cerebellum unremarkable. Vascular: No asymmetric hyperdense vasculature at the skull base. Skull: Intact Sinuses/Orbits: Paranasal sinuses are clear. Orbits are unremarkable. Other: Mastoid air cells and middle ear cavities are clear. IMPRESSION: No acute intracranial injury.  No calvarial fracture. Stable mild senescent change and remote infarcts as outlined above. Electronically Signed   By: Fidela Salisbury MD   On: 09/04/2020 01:13   DG Chest Portable 1 View  Result Date: 09/04/2020 CLINICAL DATA:  Fall, chest injury EXAM: PORTABLE CHEST 1 VIEW COMPARISON:  03/28/2017 FINDINGS: The left costophrenic angle is excluded from view. The lungs are symmetrically expanded. Mild to moderate cardiomegaly is again noted. There is central pulmonary vascular congestion in keeping with early cardiogenic failure or mild volume overload, new since prior examination. No superimposed overt pulmonary edema. No pneumothorax or definite pleural effusion. No acute bone abnormality. IMPRESSION: Technically limited examination without radiographic evidence of acute cardiopulmonary disease. Stable mild-to-moderate cardiomegaly with development of pulmonary vascular congestion Electronically Signed   By: Fidela Salisbury MD   On: 09/04/2020 01:09   ECHOCARDIOGRAM COMPLETE  Result Date: 09/04/2020    ECHOCARDIOGRAM REPORT   Patient Name:   [name] Date of Exam: 09/04/2020 Medical Rec #:  [id number]       Height:       66.0 in Accession #:    [id number]      Weight:       220.0 lb Date of Birth:  Feb 04, 1946       BSA:          2.082 m Patient Age:    65 years        BP:           123/82 mmHg Patient Gender: F               HR:           75 bpm. Exam Location:  Inpatient Procedure: 2D Echo Indications:    CHF  History:         Patient has prior history of Echocardiogram examinations, most                 recent 03/26/2017. CHF; Risk Factors:Hypertension.  Sonographer:    Maudry Mayhew MHA, RDMS, RVT, RDCS Referring Phys: [id number] [facility] A SMITH  Sonographer Comments: Technically challenging study due to limited acoustic windows. Image acquisition challenging due to patient body habitus. IMPRESSIONS  1. Left ventricular ejection fraction, by estimation, is 55 to 60%. The left ventricle has normal function. The left ventricle has no regional wall motion abnormalities. There is mild concentric left ventricular hypertrophy. Left ventricular diastolic function could not be evaluated.  2. Right ventricular systolic function is moderately reduced. The right ventricular size is moderately enlarged. There is mildly elevated pulmonary artery systolic pressure.  3. Left atrial size was severely dilated.  4. Right atrial  size was severely dilated.  5. The mitral valve is rheumatic. There is mild calcification of the anterior and posterior mitral valve leaflet(s). Moderately decreased mobility of the mitral valve leaflets. Mild mitral annular calcification. Mild mitral valve regurgitation. Moderate  to severe mitral stenosis. The mean mitral valve gradient is 10.5 mmHg at an average heart rate in atrial fibrillation of 78bpm.  6. The aortic valve was not well visualized. Aortic valve regurgitation is mild. No aortic stenosis is present.  7. The inferior vena cava is normal in size with greater than 50% respiratory variability, suggesting right atrial pressure of 3 mmHg. FINDINGS  Left Ventricle: Left ventricular ejection fraction, by estimation, is 55 to 60%. The left ventricle has normal function. The left ventricle has no regional wall motion abnormalities. Definity contrast agent was given IV to delineate the left ventricular  endocardial borders. The left ventricular internal cavity size was normal in size. There is mild concentric left  ventricular hypertrophy. Left ventricular diastolic function could not be evaluated due to atrial fibrillation. Left ventricular diastolic function could not be evaluated. Right Ventricle: The right ventricular size is moderately enlarged. No increase in right ventricular wall thickness. Right ventricular systolic function is moderately reduced. There is mildly elevated pulmonary artery systolic pressure. The tricuspid regurgitant velocity is 3.17 m/s, and with an assumed right atrial pressure of 3 mmHg, the estimated right ventricular systolic pressure is 09.3 mmHg. Left Atrium: Left atrial size was severely dilated. Right Atrium: Right atrial size was severely dilated. Pericardium: There is no evidence of pericardial effusion. Mitral Valve: The mitral valve is rheumatic. There is mild calcification of the anterior and posterior mitral valve leaflet(s). Moderately decreased mobility of the mitral valve leaflets. Mild mitral annular calcification. Mild mitral valve regurgitation. Moderate to severe mitral valve stenosis. MV peak gradient, 18.7 mmHg. The mean mitral valve gradient is 10.5 mmHg at an average heart rate of 78bpm. Tricuspid Valve: The tricuspid valve is normal in structure. Tricuspid valve regurgitation is mild . No evidence of tricuspid stenosis. Aortic Valve: The aortic valve was not well visualized. Aortic valve regurgitation is mild. No aortic stenosis is present. Aortic valve mean gradient measures 6.5 mmHg. Aortic valve peak gradient measures 14.1 mmHg. Aortic valve area, by VTI measures 0.91 cm. Pulmonic Valve: The pulmonic valve was not well visualized. Pulmonic valve regurgitation is not visualized. No evidence of pulmonic stenosis. Aorta: The aortic root is normal in size and structure. Venous: The inferior vena cava is normal in size with greater than 50% respiratory variability, suggesting right atrial pressure of 3 mmHg. IAS/Shunts: No atrial level shunt detected by color flow Doppler.   LEFT VENTRICLE PLAX 2D LVIDd:         4.70 cm LVIDs:         3.40 cm LV PW:         1.20 cm LV IVS:        1.20 cm LVOT diam:     1.80 cm LV SV:         35 LV SV Index:   17 LVOT Area:     2.54 cm  RIGHT VENTRICLE TAPSE (M-mode): 1.3 cm LEFT ATRIUM              Index        RIGHT ATRIUM           Index LA diam:        6.50 cm  3.12 cm/m   RA Area:  31.70 cm LA Vol (A2C):   168.0 ml 80.67 ml/m  RA Volume:   116.00 ml 55.70 ml/m LA Vol (A4C):   217.0 ml 104.20 ml/m LA Biplane Vol: 200.0 ml 96.04 ml/m  AORTIC VALVE AV Area (Vmax):    0.96 cm AV Area (Vmean):   0.93 cm AV Area (VTI):     0.91 cm AV Vmax:           187.50 cm/s AV Vmean:          121.000 cm/s AV VTI:            0.384 m AV Peak Grad:      14.1 mmHg AV Mean Grad:      6.5 mmHg LVOT Vmax:         70.90 cm/s LVOT Vmean:        44.300 cm/s LVOT VTI:          0.137 m LVOT/AV VTI ratio: 0.36  AORTA Ao Root diam: 3.10 cm MITRAL VALVE              TRICUSPID VALVE MV Area VTI:  0.56 cm    TR Peak grad:   40.2 mmHg MV Peak grad: 18.7 mmHg   TR Vmax:        317.00 cm/s MV Mean grad: 10.5 mmHg MV Vmax:      2.16 m/s    SHUNTS MV Vmean:     154.5 cm/s  Systemic VTI:  0.14 m MR Peak grad: 70.6 mmHg   Systemic Diam: 1.80 cm MR Mean grad: 66.0 mmHg MR Vmax:      420.20 cm/s MR Vmean:     389.0 cm/s Fransico Him MD Electronically signed by Fransico Him MD Signature Date/Time: 09/04/2020/11:41:14 AM    Final    DG HIP UNILAT WITH PELVIS 2-3 VIEWS LEFT  Result Date: 09/04/2020 CLINICAL DATA:  Fall, left hip pain EXAM: DG HIP (WITH OR WITHOUT PELVIS) 2-3V LEFT COMPARISON:  None. FINDINGS: Normal alignment. No fracture or dislocation. Mild left hip degenerative arthritis. Surgical clips are seen surrounding the left hip. IMPRESSION: No acute fracture or dislocation. Electronically Signed   By: Fidela Salisbury MD   On: 09/04/2020 01:18   DG HIP UNILAT WITH PELVIS 2-3 VIEWS RIGHT  Result Date: 09/04/2020 CLINICAL DATA:  Fall buttock pain EXAM: DG HIP (WITH OR  WITHOUT PELVIS) 2-3V RIGHT COMPARISON:  09/19/2019 FINDINGS: Single view radiograph of the pelvis and two view radiograph of the right hip demonstrates normal alignment. No fracture or dislocation. Mild right hip degenerative arthritis, stable since prior examination. Multiple surgical clips noted surrounding the left hip. IMPRESSION: No acute fracture or dislocation. Electronically Signed   By: Fidela Salisbury MD   On: 09/04/2020 01:17    Cardiac Studies:  ECG: afib nonspecific ST changes    Telemetry:  afib   Echo: EF 55-60% severe biatrial enlargement MS mild MR   Medications:    aspirin EC  81 mg Oral Daily   FLUoxetine  20 mg Oral Daily   losartan  100 mg Oral Daily   metoprolol tartrate  25 mg Oral BID   potassium chloride SA  10 mEq Oral Daily   sodium chloride flush  3 mL Intravenous Q12H   warfarin  6 mg Oral ONCE-1600   Warfarin - Pharmacist Dosing Inpatient   Does not apply q1600      sodium chloride      Assessment/Plan:   Afib: rate control ok continue lopressor INR  slightly low at 2.0 adjust per pharmacy  HTN:  continue ARB  Diastolic CHF: related to mitral valve disease MS, age and afib. BNP 541 Diuresed 1.4 L's would put on daily lasix given Her afib and mitral stenosis  Mechanical fall xrays with no fracture ? Going to rehab due to lack of mobility    Jenkins Rouge 09/05/2020, 10:36 AM

## 2020-09-05 NOTE — Progress Notes (Signed)
PT Cancellation Note  Patient Details Name: [name] MRN: [id number] DOB: February 23, 1946   Cancelled Treatment:    Reason Eval/Treat Not Completed: Other (comment).  Two attempts but has not been medicated yet.  Retry as time and pt allow.   Ramond Dial 09/05/2020, 10:54 AM  Mee Hives, PT MS Acute Rehab Dept. Number: [place] [facility] [place]

## 2020-09-05 NOTE — Progress Notes (Signed)
[facility]  PROGRESS NOTE  [name]:[id number] DOB: [birth year] DOA: 09/03/2020 PCP: Josetta Huddle, MD   Brief HPI:   75 year old female with medical history of hypertension, systolic congestive heart failure, EF 40 to 45%, permanent atrial fibrillation on anticoagulation, right MCA stroke with hemorrhagic conversion in 2012, history of DVT and PE, HIT presented after having a fall at home.  In the ED patient was found to be hypoxemic with O2 sats 88% on room air with improvement on oxygen 2 L/min of nasal cannula, x-ray of lumbar spine noted mild diffuse degenerative disc disease.  X-ray of hips did not show any acute abnormality.    Subjective   Patient seen and examined, still complains of low back pain.  Denies shortness of breath.   Assessment/Plan:     Acute hypoxemic respiratory failure -Secondary to acute systolic congestive heart failure -Received Lasix 40 mg IV x 2 yesterday -Breathing significantly improved -Cardiology following; recommend daily Lasix.  We will start Lasix 40 mg p.o. daily   Permanent atrial fibrillation -Patient presented with subtherapeutic INR; improved to 2.0 this morning -Presented in atrial fibrillation with heart rate of 110 -Rate control was achieved after patient was given IV metoprolol -At this time heart rate is controlled, continue metoprolol 25 mg p.o. twice daily   S/p fall -No acute injuries noted on x-ray imaging of hips and lumbar spine -PT saw the patient, recommend skilled nursing facility rehab -[facility] consulted for rehab placement   Hypertension -Blood pressure is stable -Continue metoprolol, losartan   History of CVA/PE/DVT -Continue aspirin and Coumadin   Scheduled medications:    aspirin EC  81 mg Oral Daily   FLUoxetine  20 mg Oral Daily   losartan  100 mg Oral Daily   metoprolol tartrate  25 mg Oral BID   potassium chloride SA  10 mEq Oral Daily   sodium chloride flush  3 mL Intravenous Q12H    warfarin  6 mg Oral ONCE-1600   Warfarin - Pharmacist Dosing Inpatient   Does not apply q1600         Data Reviewed:   CBG:  No results for input(s): GLUCAP in the last 168 hours.  SpO2: 97 % O2 Flow Rate (L/min): 1 L/min    Vitals:   09/05/20 0405 09/05/20 0407 09/05/20 0748 09/05/20 1118  BP: 115/72  (!) 147/74 134/89  Pulse: 80  90 78  Resp: 18  16 19   Temp: 98.4 F (36.9 C)  98.1 F (36.7 C) 98.1 F (36.7 C)  TempSrc: Oral  Oral Oral  SpO2: 98%  93% 97%  Weight:  112.3 kg    Height:         Intake/Output Summary (Last 24 hours) at 09/05/2020 1529 Last data filed at 09/05/2020 1330 Gross per 24 hour  Intake 840 ml  Output 2550 ml  Net -1710 ml    06/09 1901 - 06/11 0700 In: 597 [P.O.:597] Out: 2150 [Urine:2150]  Filed Weights   09/04/20 0850 09/05/20 0407  Weight: 114.4 kg 112.3 kg    CBC:  Recent Labs  Lab 09/03/20 2234 09/05/20 0254  WBC 12.3* 10.0  HGB 14.2 13.5  HCT 44.8 40.7  PLT 196 166  MCV 101.1* 97.1  MCH 32.1 32.2  MCHC 31.7 33.2  RDW 13.2 13.2    Complete metabolic panel:  Recent Labs  Lab 09/03/20 2221 09/03/20 2256 09/04/20 0907 09/04/20 1239 09/04/20 1834 09/05/20 0254  NA 135  --   --   --   --  134*  K 4.2  --   --   --   --  4.0  CL 101  --   --   --   --  92*  CO2 23  --   --   --   --  31  GLUCOSE 180*  --   --   --   --  122*  BUN 12  --   --   --   --  13  CREATININE 0.91  --   --   --   --  1.05*  CALCIUM 8.5*  --   --   --   --  8.7*  INR  --  1.8* 2.0*  --   --  2.0*  TSH  --   --   --  3.381  --   --   HGBA1C  --   --   --   --  6.0*  --   BNP  --   --  540.9*  --   --   --     No results for input(s): LIPASE, AMYLASE in the last 168 hours.  Recent Labs  Lab 09/04/20 0734 09/04/20 0907  BNP  --  540.9*  SARSCOV2NAA NEGATIVE  --     ------------------------------------------------------------------------------------------------------------------ No results for input(s): CHOL, HDL, LDLCALC,  TRIG, CHOLHDL, LDLDIRECT in the last 72 hours.  Lab Results  Component Value Date   HGBA1C 6.0 (H) 09/04/2020   ------------------------------------------------------------------------------------------------------------------ Recent Labs    09/04/20 1239  TSH 3.381   ------------------------------------------------------------------------------------------------------------------ No results for input(s): VITAMINB12, FOLATE, FERRITIN, TIBC, IRON, RETICCTPCT in the last 72 hours.  Coagulation profile Recent Labs  Lab 09/03/20 2256 09/04/20 0907 09/05/20 0254  INR 1.8* 2.0* 2.0*   No results for input(s): DDIMER in the last 72 hours.  Cardiac Enzymes No results for input(s): CKTOTAL, CKMB, CKMBINDEX, TROPONINI in the last 168 hours.  ------------------------------------------------------------------------------------------------------------------    Component Value Date/Time   BNP 540.9 (H) 09/04/2020 0907     Antibiotics: Anti-infectives (From admission, onward)    None        Radiology Reports  DG Lumbar Spine Complete  Result Date: 09/04/2020 CLINICAL DATA:  Fall, back pain EXAM: LUMBAR SPINE - COMPLETE 4+ VIEW COMPARISON:  None. FINDINGS: Normal lumbar lordosis. No acute fracture or listhesis of the lumbar spine. Vertebral body height is preserved. There is diffuse mild intervertebral disc space narrowing and endplate remodeling throughout the lumbar spine in keeping with changes of diffuse mild degenerative disc disease. Fracture TrapEase inferior vena cava filter again noted in the expected infrarenal cava. The paraspinal soft tissues are otherwise unremarkable. IMPRESSION: Mild diffuse degenerative disc disease. No acute fracture or listhesis. Electronically Signed   By: Fidela Salisbury MD   On: 09/04/2020 01:15   CT Head Wo Contrast  Result Date: 09/04/2020 CLINICAL DATA:  Head injury, fall EXAM: CT HEAD WITHOUT CONTRAST TECHNIQUE: Contiguous axial images were  obtained from the base of the skull through the vertex without intravenous contrast. COMPARISON:  None. FINDINGS: Brain: Normal anatomic configuration. Parenchymal volume loss is commensurate with the patient's age. Mild periventricular white matter changes are present likely reflecting the sequela of small vessel ischemia. 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 There is focal encephalomalacia within the right basal ganglia, caudate body and corona radiata related to intraparenchymal hematoma noted on 06/03/2010, unchanged from prior examination. Tiny remote lacunar infarct noted within the right cerebellar hemisphere. No abnormal intra or extra-axial mass  lesion or fluid collection. No abnormal mass effect or midline shift. No evidence of acute intracranial hemorrhage or infarct. Mild ex vacuo dilation of the right lateral ventricle. Ventricular size is otherwise normal. Cerebellum unremarkable. Vascular: No asymmetric hyperdense vasculature at the skull base. Skull: Intact Sinuses/Orbits: Paranasal sinuses are clear. Orbits are unremarkable. Other: Mastoid air cells and middle ear cavities are clear. IMPRESSION: No acute intracranial injury.  No calvarial fracture. Stable mild senescent change and remote infarcts as outlined above. Electronically Signed   By: Fidela Salisbury MD   On: 09/04/2020 01:13   DG Chest Portable 1 View  Result Date: 09/04/2020 CLINICAL DATA:  Fall, chest injury EXAM: PORTABLE CHEST 1 VIEW COMPARISON:  03/28/2017 FINDINGS: The left costophrenic angle is excluded from view. The lungs are symmetrically expanded. Mild to moderate cardiomegaly is again noted. There is central pulmonary vascular congestion in keeping with early cardiogenic failure or mild volume overload, new since prior examination. No superimposed overt pulmonary edema. No pneumothorax or definite pleural effusion. No acute bone abnormality. IMPRESSION: Technically limited examination  without radiographic evidence of acute cardiopulmonary disease. Stable mild-to-moderate cardiomegaly with development of pulmonary vascular congestion Electronically Signed   By: Fidela Salisbury MD   On: 09/04/2020 01:09   ECHOCARDIOGRAM COMPLETE  Result Date: 09/04/2020    ECHOCARDIOGRAM REPORT   Patient Name:   [name] TECH Date of Exam: 09/04/2020 Medical Rec #:  [id number]       Height:       66.0 in Accession #:    [id number]      Weight:       220.0 lb Date of Birth:  1945/06/17       BSA:          2.082 m Patient Age:    12 years        BP:           123/82 mmHg Patient Gender: F               HR:           75 bpm. Exam Location:  Inpatient Procedure: 2D Echo Indications:    CHF  History:        Patient has prior history of Echocardiogram examinations, most                 recent 03/26/2017. CHF; Risk Factors:Hypertension.  Sonographer:    Maudry Mayhew MHA, RDMS, RVT, RDCS Referring Phys: [id number] [facility] A SMITH  Sonographer Comments: Technically challenging study due to limited acoustic windows. Image acquisition challenging due to patient body habitus. IMPRESSIONS  1. Left ventricular ejection fraction, by estimation, is 55 to 60%. The left ventricle has normal function. The left ventricle has no regional wall motion abnormalities. There is mild concentric left ventricular hypertrophy. Left ventricular diastolic function could not be evaluated.  2. Right ventricular systolic function is moderately reduced. The right ventricular size is moderately enlarged. There is mildly elevated pulmonary artery systolic pressure.  3. Left atrial size was severely dilated.  4. Right atrial size was severely dilated.  5. The mitral valve is rheumatic. There is mild calcification of the anterior and posterior mitral valve leaflet(s). Moderately decreased mobility of the mitral valve leaflets. Mild mitral annular calcification. Mild mitral valve regurgitation. Moderate  to severe mitral stenosis. The mean mitral  valve gradient is 10.5 mmHg at an average heart rate in atrial fibrillation of 78bpm.  6. The aortic valve was not well visualized. Aortic valve regurgitation  is mild. No aortic stenosis is present.  7. The inferior vena cava is normal in size with greater than 50% respiratory variability, suggesting right atrial pressure of 3 mmHg. FINDINGS  Left Ventricle: Left ventricular ejection fraction, by estimation, is 55 to 60%. The left ventricle has normal function. The left ventricle has no regional wall motion abnormalities. Definity contrast agent was given IV to delineate the left ventricular  endocardial borders. The left ventricular internal cavity size was normal in size. There is mild concentric left ventricular hypertrophy. Left ventricular diastolic function could not be evaluated due to atrial fibrillation. Left ventricular diastolic function could not be evaluated. Right Ventricle: The right ventricular size is moderately enlarged. No increase in right ventricular wall thickness. Right ventricular systolic function is moderately reduced. There is mildly elevated pulmonary artery systolic pressure. The tricuspid regurgitant velocity is 3.17 m/s, and with an assumed right atrial pressure of 3 mmHg, the estimated right ventricular systolic pressure is 03.4 mmHg. Left Atrium: Left atrial size was severely dilated. Right Atrium: Right atrial size was severely dilated. Pericardium: There is no evidence of pericardial effusion. Mitral Valve: The mitral valve is rheumatic. There is mild calcification of the anterior and posterior mitral valve leaflet(s). Moderately decreased mobility of the mitral valve leaflets. Mild mitral annular calcification. Mild mitral valve regurgitation. Moderate to severe mitral valve stenosis. MV peak gradient, 18.7 mmHg. The mean mitral valve gradient is 10.5 mmHg at an average heart rate of 78bpm. Tricuspid Valve: The tricuspid valve is normal in structure. Tricuspid valve regurgitation  is mild . No evidence of tricuspid stenosis. Aortic Valve: The aortic valve was not well visualized. Aortic valve regurgitation is mild. No aortic stenosis is present. Aortic valve mean gradient measures 6.5 mmHg. Aortic valve peak gradient measures 14.1 mmHg. Aortic valve area, by VTI measures 0.91 cm. Pulmonic Valve: The pulmonic valve was not well visualized. Pulmonic valve regurgitation is not visualized. No evidence of pulmonic stenosis. Aorta: The aortic root is normal in size and structure. Venous: The inferior vena cava is normal in size with greater than 50% respiratory variability, suggesting right atrial pressure of 3 mmHg. IAS/Shunts: No atrial level shunt detected by color flow Doppler.  LEFT VENTRICLE PLAX 2D LVIDd:         4.70 cm LVIDs:         3.40 cm LV PW:         1.20 cm LV IVS:        1.20 cm LVOT diam:     1.80 cm LV SV:         35 LV SV Index:   17 LVOT Area:     2.54 cm  RIGHT VENTRICLE TAPSE (M-mode): 1.3 cm LEFT ATRIUM              Index        RIGHT ATRIUM           Index LA diam:        6.50 cm  3.12 cm/m   RA Area:     31.70 cm LA Vol (A2C):   168.0 ml 80.67 ml/m  RA Volume:   116.00 ml 55.70 ml/m LA Vol (A4C):   217.0 ml 104.20 ml/m LA Biplane Vol: 200.0 ml 96.04 ml/m  AORTIC VALVE AV Area (Vmax):    0.96 cm AV Area (Vmean):   0.93 cm AV Area (VTI):     0.91 cm AV Vmax:           187.50  cm/s AV Vmean:          121.000 cm/s AV VTI:            0.384 m AV Peak Grad:      14.1 mmHg AV Mean Grad:      6.5 mmHg LVOT Vmax:         70.90 cm/s LVOT Vmean:        44.300 cm/s LVOT VTI:          0.137 m LVOT/AV VTI ratio: 0.36  AORTA Ao Root diam: 3.10 cm MITRAL VALVE              TRICUSPID VALVE MV Area VTI:  0.56 cm    TR Peak grad:   40.2 mmHg MV Peak grad: 18.7 mmHg   TR Vmax:        317.00 cm/s MV Mean grad: 10.5 mmHg MV Vmax:      2.16 m/s    SHUNTS MV Vmean:     154.5 cm/s  Systemic VTI:  0.14 m MR Peak grad: 70.6 mmHg   Systemic Diam: 1.80 cm MR Mean grad: 66.0 mmHg MR Vmax:       420.20 cm/s MR Vmean:     389.0 cm/s Fransico Him MD Electronically signed by Fransico Him MD Signature Date/Time: 09/04/2020/11:41:14 AM    Final    DG HIP UNILAT WITH PELVIS 2-3 VIEWS LEFT  Result Date: 09/04/2020 CLINICAL DATA:  Fall, left hip pain EXAM: DG HIP (WITH OR WITHOUT PELVIS) 2-3V LEFT COMPARISON:  None. FINDINGS: Normal alignment. No fracture or dislocation. Mild left hip degenerative arthritis. Surgical clips are seen surrounding the left hip. IMPRESSION: No acute fracture or dislocation. Electronically Signed   By: Fidela Salisbury MD   On: 09/04/2020 01:18   DG HIP UNILAT WITH PELVIS 2-3 VIEWS RIGHT  Result Date: 09/04/2020 CLINICAL DATA:  Fall buttock pain EXAM: DG HIP (WITH OR WITHOUT PELVIS) 2-3V RIGHT COMPARISON:  09/19/2019 FINDINGS: Single view radiograph of the pelvis and two view radiograph of the right hip demonstrates normal alignment. No fracture or dislocation. Mild right hip degenerative arthritis, stable since prior examination. Multiple surgical clips noted surrounding the left hip. IMPRESSION: No acute fracture or dislocation. Electronically Signed   By: Fidela Salisbury MD   On: 09/04/2020 01:17      DVT prophylaxis: Warfarin  Code Status: Full code  Family Communication: No family at bedside   Consultants: Cardiology  Procedures:     Objective    Physical Examination:  General-appears in no acute distress Heart-S1-S2, regular, no murmur auscultated Lungs-clear to auscultation bilaterally, no wheezing or crackles auscultated Abdomen-soft, nontender, no organomegaly Extremities-no edema in the lower extremities Neuro-alert, oriented x3, no focal deficit noted  Status is: Inpatient  Dispo: The patient is from: Home              Anticipated d/c is to: Skilled nursing facility              Anticipated d/c date is: 09/07/2020              Patient currently not stable for discharge  Barrier to discharge-pending availability of skilled nursing  facility  COVID-19 Labs  No results for input(s): DDIMER, FERRITIN, LDH, CRP in the last 72 hours.  Lab Results  Component Value Date   [place] NEGATIVE 09/04/2020    Microbiology  Recent Results (from the past 240 hour(s))  SARS CORONAVIRUS 2 (TAT 6-24 HRS) Nasopharyngeal Nasopharyngeal Swab  Status: None   Collection Time: 09/04/20  7:34 AM   Specimen: Nasopharyngeal Swab  Result Value Ref Range Status   SARS Coronavirus 2 NEGATIVE NEGATIVE Final    Comment: (NOTE) SARS-CoV-2 target nucleic acids are NOT DETECTED.  The SARS-CoV-2 RNA is generally detectable in upper and lower respiratory specimens during the acute phase of infection. Negative results do not preclude SARS-CoV-2 infection, do not rule out co-infections with other pathogens, and should not be used as the sole basis for treatment or other patient management decisions. Negative results must be combined with clinical observations, patient history, and epidemiological information. The expected result is Negative.  Fact Sheet for Patients: SugarRoll.be  Fact Sheet for Healthcare Providers: https://www.woods-mathews.com/  This test is not yet approved or cleared by the [place] FDA and  has been authorized for detection and/or diagnosis of SARS-CoV-2 by FDA under an Emergency Use Authorization (EUA). This EUA will remain  in effect (meaning this test can be used) for the duration of the COVID-19 declaration under Se ction 564(b)(1) of the Act, 21 U.S.C. section 360bbb-3(b)(1), unless the authorization is terminated or revoked sooner.  Performed at [facility], [place] [address], [place], [place] [address]     Pressure Injury 03/24/17 (Active)  03/24/17 0200  Location: Sacrum  Location Orientation:   Staging:   Wound Description (Comments):   Present on Admission:           [place]   [facility] If 7PM-7AM, please contact  night-coverage at [email or web address], Office  [phone]   09/05/2020, 3:29 PM  LOS: 1 day

## 2020-09-06 DIAGNOSIS — I050 Rheumatic mitral stenosis: Secondary | ICD-10-CM

## 2020-09-06 DIAGNOSIS — Z7901 Long term (current) use of anticoagulants: Secondary | ICD-10-CM

## 2020-09-06 LAB — GLUCOSE, CAPILLARY: Glucose-Capillary: 116 mg/dL — ABNORMAL HIGH (ref 70–99)

## 2020-09-06 LAB — BASIC METABOLIC PANEL
Anion gap: 10 (ref 5–15)
BUN: 17 mg/dL (ref 8–23)
CO2: 28 mmol/L (ref 22–32)
Calcium: 8.7 mg/dL — ABNORMAL LOW (ref 8.9–10.3)
Chloride: 94 mmol/L — ABNORMAL LOW (ref 98–111)
Creatinine, Ser: 0.98 mg/dL (ref 0.44–1.00)
GFR, Estimated: >60 mL/min (ref >60)
Glucose, Bld: 138 mg/dL — ABNORMAL HIGH (ref 70–99)
Potassium: 3.8 mmol/L (ref 3.5–5.1)
Sodium: 132 mmol/L — ABNORMAL LOW (ref 135–145)

## 2020-09-06 LAB — PROTIME-INR
INR: 2 — ABNORMAL HIGH (ref 0.8–1.2)
Prothrombin Time: 22.5 seconds — ABNORMAL HIGH (ref 11.4–15.2)

## 2020-09-06 MED ORDER — WARFARIN SODIUM 3 MG PO TABS
6.0000 mg | ORAL_TABLET | Freq: Once | ORAL | Status: AC
  Administered 2020-09-06: 6 mg via ORAL
  Filled 2020-09-06: qty 2

## 2020-09-06 NOTE — Plan of Care (Signed)
  Problem: Education: Goal: Knowledge of General Education information will improve Description Including pain rating scale, medication(s)/side effects and non-pharmacologic comfort measures Outcome: Progressing   Problem: Health Behavior/Discharge Planning: Goal: Ability to manage health-related needs will improve Outcome: Progressing   

## 2020-09-06 NOTE — Progress Notes (Signed)
ANTICOAGULATION CONSULT NOTE - [place] for Warfarin Indication: AF, History of PE, popliteal artery embolus  Allergies  Allergen Reactions   Heparin Hives    Patient attests severe allergy- rash all over, severe itching, unable to take heparin   Other Itching and Rash    "Sheets and Linens used by [facility]"   Patient Measurements: Height: 5\' 6"  (167.6 cm) Weight: 112.2 kg (247 lb 5.7 oz) IBW/kg (Calculated) : 59.3 Vital Signs: Temp: 97.7 F (36.5 C) (06/12 0546) Temp Source: Oral (06/12 0546) BP: 148/91 (06/12 0546) Pulse Rate: 86 (06/12 0546)  Labs: Recent Labs    09/03/20 2221 09/03/20 2234 09/03/20 2256 09/04/20 0907 09/05/20 0254 09/06/20 0247  HGB  --  14.2  --   --  13.5  --   HCT  --  44.8  --   --  40.7  --   PLT  --  196  --   --  166  --   LABPROT  --   --    < > 22.3* 22.3* 22.5*  INR  --   --    < > 2.0* 2.0* 2.0*  CREATININE 0.91  --   --   --  1.05* 0.98   < > = values in this interval not displayed.    Estimated Creatinine Clearance: 64 mL/min (by C-G formula based on SCr of 0.98 mg/dL).  Assessment: 75 yo female taking warfarin (Indication: AF, History of PE, popliteal artery embolus). INR Goal 2.5-3.5 per last anticoag visit May '22. INR on admit (6/9) was subtherapeutic at 1.8.  INR continues to be subtherapeutic at 2.0. CBC stable/WNL.  Home dose: 6 mg (3 mg x 2) every Wed; 4.5 mg (3 mg x 1.5) all other days.  Goal of Therapy:  INR 2.5-3.5 Monitor CBC and INR   Plan:  Warfarin 6 mg x1 Daily PT/INR  Romilda Garret, PharmD PGY1 Acute Care Pharmacy Resident 09/06/2020 8:49 AM  Please check [email or web address] for unit specific pharmacy phone numbers.

## 2020-09-06 NOTE — Progress Notes (Signed)
Patient ID: [name], female   DOB: 21-Jan-1946, 75 y.o.   MRN: [id number]    Subjective:  Denies SSCP, palpitations or Dyspnea Still very immobile due to coccyx pain   Objective:  Vitals:   09/05/20 2128 09/06/20 0047 09/06/20 0546 09/06/20 0852  BP: (!) 148/94  (!) 148/91 (!) 161/84  Pulse: 98  86 96  Resp:   18   Temp:   97.7 F (36.5 C)   TempSrc:   Oral   SpO2:   95%   Weight:  112.2 kg    Height:        Intake/Output from previous day:  Intake/Output Summary (Last 24 hours) at 09/06/2020 1037 Last data filed at 09/06/2020 2119 Gross per 24 hour  Intake 1137 ml  Output 1500 ml  Net -363 ml    Physical Exam: Affect appropriate Obese white female  HEENT: normal Neck supple with no adenopathy JVP normal no bruits no thyromegaly Lungs clear with no wheezing and good diaphragmatic motion Heart:  S1/S2 SEM murmur, no rub, gallop or click PMI normal Abdomen: benighn, BS positve, no tenderness, no AAA no bruit.  No HSM or HJR Distal pulses intact with no bruits No edema Neuro non-focal Bruising on coccyx but patient unable to fully roll over to evaluate    Lab Results: Basic Metabolic Panel: Recent Labs    09/05/20 0254 09/06/20 0247  NA 134* 132*  K 4.0 3.8  CL 92* 94*  CO2 31 28  GLUCOSE 122* 138*  BUN 13 17  CREATININE 1.05* 0.98  CALCIUM 8.7* 8.7*   Liver Function Tests: No results for input(s): AST, ALT, ALKPHOS, BILITOT, PROT, ALBUMIN in the last 72 hours. No results for input(s): LIPASE, AMYLASE in the last 72 hours. CBC: Recent Labs    09/03/20 2234 09/05/20 0254  WBC 12.3* 10.0  HGB 14.2 13.5  HCT 44.8 40.7  MCV 101.1* 97.1  PLT 196 166   Cardiac Enzymes: No results for input(s): CKTOTAL, CKMB, CKMBINDEX, TROPONINI in the last 72 hours. BNP: Invalid input(s): POCBNP D-Dimer: No results for input(s): DDIMER in the last 72 hours. Hemoglobin A1C: Recent Labs    09/04/20 1834  HGBA1C 6.0*   Fasting Lipid Panel: No results  for input(s): CHOL, HDL, LDLCALC, TRIG, CHOLHDL, LDLDIRECT in the last 72 hours. Thyroid Function Tests: Recent Labs    09/04/20 1239  TSH 3.381   Anemia Panel: No results for input(s): VITAMINB12, FOLATE, FERRITIN, TIBC, IRON, RETICCTPCT in the last 72 hours.  Imaging: ECHOCARDIOGRAM COMPLETE  Result Date: 09/04/2020    ECHOCARDIOGRAM REPORT   Patient Name:   [name] Date of Exam: 09/04/2020 Medical Rec #:  [id number]       Height:       66.0 in Accession #:    [id number]      Weight:       220.0 lb Date of Birth:  1945/12/06       BSA:          2.082 m Patient Age:    61 years        BP:           123/82 mmHg Patient Gender: F               HR:           75 bpm. Exam Location:  Inpatient Procedure: 2D Echo Indications:    CHF  History:        Patient has prior  history of Echocardiogram examinations, most                 recent 03/26/2017. CHF; Risk Factors:Hypertension.  Sonographer:    Maudry Mayhew MHA, RDMS, RVT, RDCS Referring Phys: [id number] [facility] A SMITH  Sonographer Comments: Technically challenging study due to limited acoustic windows. Image acquisition challenging due to patient body habitus. IMPRESSIONS  1. Left ventricular ejection fraction, by estimation, is 55 to 60%. The left ventricle has normal function. The left ventricle has no regional wall motion abnormalities. There is mild concentric left ventricular hypertrophy. Left ventricular diastolic function could not be evaluated.  2. Right ventricular systolic function is moderately reduced. The right ventricular size is moderately enlarged. There is mildly elevated pulmonary artery systolic pressure.  3. Left atrial size was severely dilated.  4. Right atrial size was severely dilated.  5. The mitral valve is rheumatic. There is mild calcification of the anterior and posterior mitral valve leaflet(s). Moderately decreased mobility of the mitral valve leaflets. Mild mitral annular calcification. Mild mitral valve  regurgitation. Moderate  to severe mitral stenosis. The mean mitral valve gradient is 10.5 mmHg at an average heart rate in atrial fibrillation of 78bpm.  6. The aortic valve was not well visualized. Aortic valve regurgitation is mild. No aortic stenosis is present.  7. The inferior vena cava is normal in size with greater than 50% respiratory variability, suggesting right atrial pressure of 3 mmHg. FINDINGS  Left Ventricle: Left ventricular ejection fraction, by estimation, is 55 to 60%. The left ventricle has normal function. The left ventricle has no regional wall motion abnormalities. Definity contrast agent was given IV to delineate the left ventricular  endocardial borders. The left ventricular internal cavity size was normal in size. There is mild concentric left ventricular hypertrophy. Left ventricular diastolic function could not be evaluated due to atrial fibrillation. Left ventricular diastolic function could not be evaluated. Right Ventricle: The right ventricular size is moderately enlarged. No increase in right ventricular wall thickness. Right ventricular systolic function is moderately reduced. There is mildly elevated pulmonary artery systolic pressure. The tricuspid regurgitant velocity is 3.17 m/s, and with an assumed right atrial pressure of 3 mmHg, the estimated right ventricular systolic pressure is 74.0 mmHg. Left Atrium: Left atrial size was severely dilated. Right Atrium: Right atrial size was severely dilated. Pericardium: There is no evidence of pericardial effusion. Mitral Valve: The mitral valve is rheumatic. There is mild calcification of the anterior and posterior mitral valve leaflet(s). Moderately decreased mobility of the mitral valve leaflets. Mild mitral annular calcification. Mild mitral valve regurgitation. Moderate to severe mitral valve stenosis. MV peak gradient, 18.7 mmHg. The mean mitral valve gradient is 10.5 mmHg at an average heart rate of 78bpm. Tricuspid Valve: The  tricuspid valve is normal in structure. Tricuspid valve regurgitation is mild . No evidence of tricuspid stenosis. Aortic Valve: The aortic valve was not well visualized. Aortic valve regurgitation is mild. No aortic stenosis is present. Aortic valve mean gradient measures 6.5 mmHg. Aortic valve peak gradient measures 14.1 mmHg. Aortic valve area, by VTI measures 0.91 cm. Pulmonic Valve: The pulmonic valve was not well visualized. Pulmonic valve regurgitation is not visualized. No evidence of pulmonic stenosis. Aorta: The aortic root is normal in size and structure. Venous: The inferior vena cava is normal in size with greater than 50% respiratory variability, suggesting right atrial pressure of 3 mmHg. IAS/Shunts: No atrial level shunt detected by color flow Doppler.  LEFT VENTRICLE PLAX 2D LVIDd:  4.70 cm LVIDs:         3.40 cm LV PW:         1.20 cm LV IVS:        1.20 cm LVOT diam:     1.80 cm LV SV:         35 LV SV Index:   17 LVOT Area:     2.54 cm  RIGHT VENTRICLE TAPSE (M-mode): 1.3 cm LEFT ATRIUM              Index        RIGHT ATRIUM           Index LA diam:        6.50 cm  3.12 cm/m   RA Area:     31.70 cm LA Vol (A2C):   168.0 ml 80.67 ml/m  RA Volume:   116.00 ml 55.70 ml/m LA Vol (A4C):   217.0 ml 104.20 ml/m LA Biplane Vol: 200.0 ml 96.04 ml/m  AORTIC VALVE AV Area (Vmax):    0.96 cm AV Area (Vmean):   0.93 cm AV Area (VTI):     0.91 cm AV Vmax:           187.50 cm/s AV Vmean:          121.000 cm/s AV VTI:            0.384 m AV Peak Grad:      14.1 mmHg AV Mean Grad:      6.5 mmHg LVOT Vmax:         70.90 cm/s LVOT Vmean:        44.300 cm/s LVOT VTI:          0.137 m LVOT/AV VTI ratio: 0.36  AORTA Ao Root diam: 3.10 cm MITRAL VALVE              TRICUSPID VALVE MV Area VTI:  0.56 cm    TR Peak grad:   40.2 mmHg MV Peak grad: 18.7 mmHg   TR Vmax:        317.00 cm/s MV Mean grad: 10.5 mmHg MV Vmax:      2.16 m/s    SHUNTS MV Vmean:     154.5 cm/s  Systemic VTI:  0.14 m MR Peak grad:  70.6 mmHg   Systemic Diam: 1.80 cm MR Mean grad: 66.0 mmHg MR Vmax:      420.20 cm/s MR Vmean:     389.0 cm/s Fransico Him MD Electronically signed by Fransico Him MD Signature Date/Time: 09/04/2020/11:41:14 AM    Final     Cardiac Studies:  ECG: afib nonspecific ST changes    Telemetry:  afib   Echo: EF 55-60% severe biatrial enlargement MS mild MR   Medications:    aspirin EC  81 mg Oral Daily   FLUoxetine  20 mg Oral Daily   furosemide  40 mg Oral Daily   losartan  100 mg Oral Daily   metoprolol tartrate  25 mg Oral BID   potassium chloride SA  10 mEq Oral Daily   sodium chloride flush  3 mL Intravenous Q12H   warfarin  6 mg Oral ONCE-1600   Warfarin - Pharmacist Dosing Inpatient   Does not apply q1600      sodium chloride      Assessment/Plan:   Afib: rate control ok continue lopressor INR slightly low at 2.0 adjust per pharmacy  HTN:  continue ARB  Diastolic CHF: related to mitral valve disease MS, age  and afib. BNP 541 Diuresed 1.4 L's would put on daily lasix given Her afib and mitral stenosis  Mechanical fall xrays with no fracture ? Going to rehab due to lack of mobility    Jenkins Rouge 09/06/2020, 10:37 AM

## 2020-09-06 NOTE — NC FL2 (Signed)
[place] MEDICAID FL2 LEVEL OF CARE SCREENING TOOL     IDENTIFICATION  Patient Name: [name] Birthdate: [birth year] Sex: female Admission Date (Current Location): 09/03/2020  [facility] and [place] Number:  Herbalist and Address:  [facility]. [facility], [place] [address], [place], [place] [address]      Provider Number:    Attending Physician Name and Address:  Oswald Hillock, MD  Relative Name and Phone Number:  [name] Daughter [phone]  [phone]    Current Level of Care: Hospital Recommended Level of Care: [place] Prior Approval Number:    Date Approved/Denied:   PASRR Number: [id number]  Discharge Plan: SNF    Current Diagnoses: Patient Active Problem List   Diagnosis Date Noted   Coccyx pain    CHF (congestive heart failure) ([place]) 09/04/2020   Acute respiratory failure with hypoxia ([place]) 09/04/2020   Fall at home, initial encounter 09/04/2020   Mitral valve stenosis    Essential hypertension 06/27/2017   History of lupus anticoagulant disorder 03/31/2017   NICM (nonischemic cardiomyopathy) ([place]) 03/31/2017   Streptococcal bacteremia    History of MRSA infection    Wound infection 03/24/2017   Cellulitis of right lower extremity 03/24/2017   Sepsis due to cellulitis ([place]) 03/04/2017   Popliteal artery embolus ([place]) 02/16/2017   Encounter for therapeutic drug monitoring 06/11/2013   History of CVA (cerebrovascular accident) 11/18/2010   Long term (current) use of anticoagulants 08/09/2010   Obesity, unspecified 08/14/2008   Permanent atrial fibrillation ([place]) 03/07/2008   History of pulmonary embolism 08/30/2005    Orientation RESPIRATION BLADDER Height & Weight     Self, Time, Situation, Place  Normal External catheter Weight: 247 lb 5.7 oz (112.2 kg) Height:  5\' 6"  (167.6 cm)  BEHAVIORAL SYMPTOMS/MOOD NEUROLOGICAL BOWEL NUTRITION STATUS      Continent Diet (see discharge summary)   AMBULATORY STATUS COMMUNICATION OF NEEDS Skin   Total Care Verbally Normal, Other (Comment) (ecchymosis)                       Personal Care Assistance Level of Assistance  Bathing, Feeding, Dressing Bathing Assistance: Maximum assistance Feeding assistance: Independent Dressing Assistance:  (total assist)     Functional Limitations Info  Sight, Hearing, Speech Sight Info: Adequate Hearing Info: Adequate Speech Info: Adequate    SPECIAL CARE FACTORS FREQUENCY  PT (By licensed PT), OT (By licensed OT)     PT Frequency: 5x week OT Frequency: 5x week            Contractures Contractures Info: Not present    Additional Factors Info  Code Status, Allergies Code Status Info: full Allergies Info: Heparin, Other           Current Medications (09/06/2020):  This is the current hospital active medication list Current Facility-Administered Medications  Medication Dose Route Frequency Provider Last Rate Last Admin   0.9 %  sodium chloride infusion  250 mL Intravenous PRN Fuller Plan A, MD       acetaminophen (TYLENOL) tablet 650 mg  650 mg Oral Q4H PRN Fuller Plan A, MD   650 mg at 09/06/20 0858   aspirin EC tablet 81 mg  81 mg Oral Daily Fuller Plan A, MD   81 mg at 09/06/20 0852   FLUoxetine (PROZAC) capsule 20 mg  20 mg Oral Daily Tamala Julian, Rondell A, MD   20 mg at 09/06/20 0852   furosemide (LASIX) tablet 40  mg  40 mg Oral Daily Oswald Hillock, MD   40 mg at 09/06/20 0852   loratadine (CLARITIN) tablet 10 mg  10 mg Oral Daily PRN Fuller Plan A, MD       losartan (COZAAR) tablet 100 mg  100 mg Oral Daily Tamala Julian, Rondell A, MD   100 mg at 09/06/20 0852   metoprolol tartrate (LOPRESSOR) tablet 25 mg  25 mg Oral BID Fuller Plan A, MD   25 mg at 09/06/20 0852   ondansetron (ZOFRAN) injection 4 mg  4 mg Intravenous Q6H PRN Smith, Rondell A, MD       potassium chloride SA (KLOR-CON) CR tablet 10 mEq  10 mEq Oral Daily Tamala Julian, Rondell A, MD   10 mEq at 09/06/20 0852    sodium chloride flush (NS) 0.9 % injection 3 mL  3 mL Intravenous Q12H Smith, Rondell A, MD   3 mL at 09/06/20 0853   sodium chloride flush (NS) 0.9 % injection 3 mL  3 mL Intravenous PRN Fuller Plan A, MD       traMADol (ULTRAM) tablet 50 mg  50 mg Oral Q6H PRN Fuller Plan A, MD   50 mg at 09/05/20 2128   warfarin (COUMADIN) tablet 6 mg  6 mg Oral ONCE-1600 Oswald Hillock, MD       Warfarin - Pharmacist Dosing Inpatient   Does not apply q1600 [facility]   Given at 09/05/20 1651     Discharge Medications: Please see discharge summary for a list of discharge medications.  Relevant Imaging Results:  Relevant Lab Results:   Additional Information SSN: [id number], Pt is vaccinated for covid with booster.  Joanne Chars, LCSW

## 2020-09-06 NOTE — Progress Notes (Signed)
Heart Failure Navigator Progress Note  Assessed for Heart & Vascular TOC clinic readiness.  Unfortunately at this time the patient does not meet criteria due to CHF related to mitral valve disease MS, age and afib.  Navigator available for reassessment of patient.   Kerby Nora, PharmD, BCPS Heart Failure Stewardship Pharmacist Phone [phone]

## 2020-09-06 NOTE — Plan of Care (Signed)
  Problem: Education: Goal: Knowledge of General Education information will improve Description: Including pain rating scale, medication(s)/side effects and non-pharmacologic comfort measures Outcome: Progressing   Problem: Health Behavior/Discharge Planning: Goal: Ability to manage health-related needs will improve Outcome: Progressing   Problem: Activity: Goal: Risk for activity intolerance will decrease Outcome: Progressing   

## 2020-09-06 NOTE — TOC Initial Note (Signed)
Transition of Care [facility]) - Initial/Assessment Note    Patient Details  Name: [name] MRN: [id number] Date of Birth: Jul 06, 1945  [facility] of [facility] CM/SW Contact:    Joanne Chars, LCSW Phone Number: 09/06/2020, 9:49 AM  Clinical Narrative:   CSW met with pt regarding recommendation for SNF.  Pt is in agreement with this plan, choice document given, permission given to send out referral in hub.  Pt indicates preference for [facility].  Permission given to speak with daughter Colletta [place] and sister Langley Gauss.  Pt is vaccinated for covid and boosted.  Current equipment in home: walker, several canes, bedside commode.    CSW spoke with daughter Colletta [place] who is also in agreement with plan for SNF and also inquiring about [facility] as option.                  Expected Discharge Plan: Skilled Nursing Facility Barriers to Discharge: Continued Medical Work up, SNF Pending bed offer   Patient Goals and CMS Choice Patient states their goals for this hospitalization and ongoing recovery are:: "driving, doing what I want to do" CMS Medicare.gov Compare Post Acute Care list provided to:: Patient Choice offered to / list presented to : Patient  Expected Discharge Plan and Services Expected Discharge Plan: [place] In-house Referral: Clinical Social Work   Post Acute Care Choice: [place] Living arrangements for the past 2 months: [place]                                      Prior Living Arrangements/Services Living arrangements for the past 2 months: Single Family Home Lives with:: Self Patient language and need for interpreter reviewed:: Yes Do you feel safe going back to the place where you live?: Yes      Need for Family Participation in Patient Care: Yes (Comment) Care giver support system in place?: Yes (comment) Current home services: Other (comment) (none) Criminal Activity/Legal Involvement Pertinent to Current  Situation/Hospitalization: No - Comment as needed  Activities of Daily Living Home Assistive Devices/Equipment: Cane (specify quad or straight), Walker (specify type) ADL Screening (condition at time of admission) Patient's cognitive ability adequate to safely complete daily activities?: Yes Is the patient deaf or have difficulty hearing?: No Does the patient have difficulty seeing, even when wearing glasses/contacts?: No Does the patient have difficulty concentrating, remembering, or making decisions?: No Patient able to express need for assistance with ADLs?: Yes Does the patient have difficulty dressing or bathing?: No Independently performs ADLs?: Yes (appropriate for developmental age) Does the patient have difficulty walking or climbing stairs?: Yes Weakness of Legs: Both Weakness of Arms/Hands: Both  Permission Sought/Granted Permission sought to share information with : Family Supports, Chartered certified accountant granted to share information with : Yes, Verbal Permission Granted  Share Information with NAME: daughter Colletta [place], sister Langley Gauss  Permission granted to share info w AGENCY: SNF        Emotional Assessment   Attitude/Demeanor/Rapport: Engaged Affect (typically observed): Appropriate, Pleasant Orientation: : Oriented to Self, Oriented to Place, Oriented to  Time, Oriented to Situation Alcohol / Substance Use: Not Applicable Psych Involvement: No (comment)  Admission diagnosis:  Coccyx pain [M53.3] CHF (congestive heart failure) ([place]) [I50.9] Fall [W19.XXXA] Hypoxia [R09.02] Acute on chronic systolic congestive heart failure (HCC) [I50.23] Acute respiratory failure with hypoxia ([place]) [J96.01] Fall in home, initial encounter [W19.Merril Abbe, Y92.009] Patient  Active Problem List   Diagnosis Date Noted   Coccyx pain    CHF (congestive heart failure) ([place]) 09/04/2020   Acute respiratory failure with hypoxia ([place]) 09/04/2020   Fall at home, initial  encounter 09/04/2020   Mitral valve stenosis    Essential hypertension 06/27/2017   History of lupus anticoagulant disorder 03/31/2017   NICM (nonischemic cardiomyopathy) ([place]) 03/31/2017   Streptococcal bacteremia    History of MRSA infection    Wound infection 03/24/2017   Cellulitis of right lower extremity 03/24/2017   Sepsis due to cellulitis ([place]) 03/04/2017   Popliteal artery embolus ([place]) 02/16/2017   Encounter for therapeutic drug monitoring 06/11/2013   History of CVA (cerebrovascular accident) 11/18/2010   Long term (current) use of anticoagulants 08/09/2010   Obesity, unspecified 08/14/2008   Permanent atrial fibrillation ([place]) 03/07/2008   History of pulmonary embolism 08/30/2005   PCP:  Josetta Huddle, MD Pharmacy:   [facility] ([facility] [place] [address]  ([place]) [place] [address] Phone: [phone] Fax: [phone]     Social Determinants of Health (SDOH) Interventions    Readmission Risk Interventions No flowsheet data found.

## 2020-09-06 NOTE — Progress Notes (Signed)
[facility]  PROGRESS NOTE  [name] MOQ:[id number] DOB: [birth year] DOA: 09/03/2020 PCP: Josetta Huddle, MD   Brief HPI:   75 year old female with medical history of hypertension, systolic congestive heart failure, EF 40 to 45%, permanent atrial fibrillation on anticoagulation, right MCA stroke with hemorrhagic conversion in 2012, history of DVT and PE, HIT presented after having a fall at home.  In the ED patient was found to be hypoxemic with O2 sats 88% on room air with improvement on oxygen 2 L/min of nasal cannula, x-ray of lumbar spine noted mild diffuse degenerative disc disease.  X-ray of hips did not show any acute abnormality.    Subjective   Patient seen and examined, back pain has improved.   Assessment/Plan:     Acute hypoxemic respiratory failure -Secondary to acute systolic congestive heart failure -Received Lasix 40 mg IV x 2 yesterday -Breathing significantly improved -Cardiology following; recommend daily Lasix.   -Started on Lasix 40 mg p.o. daily   Permanent atrial fibrillation -Patient presented with subtherapeutic INR; improved to 2.0 this morning -Presented in atrial fibrillation with heart rate of 110 -Rate control was achieved after patient was given IV metoprolol -At this time heart rate is controlled, continue metoprolol 25 mg p.o. twice daily   S/p fall -No acute injuries noted on x-ray imaging of hips and lumbar spine -PT saw the patient, recommend skilled nursing facility rehab -[facility] consulted for rehab placement   Hypertension -Blood pressure is stable -Continue metoprolol, losartan   History of CVA/PE/DVT -Continue aspirin and Coumadin   Scheduled medications:    aspirin EC  81 mg Oral Daily   FLUoxetine  20 mg Oral Daily   furosemide  40 mg Oral Daily   losartan  100 mg Oral Daily   metoprolol tartrate  25 mg Oral BID   potassium chloride SA  10 mEq Oral Daily   sodium chloride flush  3 mL Intravenous Q12H    warfarin  6 mg Oral ONCE-1600   Warfarin - Pharmacist Dosing Inpatient   Does not apply q1600         Data Reviewed:   CBG:  Recent Labs  Lab 09/06/20 1139  GLUCAP 116*    SpO2: 94 % O2 Flow Rate (L/min): 1 L/min    Vitals:   09/06/20 0047 09/06/20 0546 09/06/20 0852 09/06/20 1135  BP:  (!) 148/91 (!) 161/84 132/89  Pulse:  86 96 80  Resp:  18  20  Temp:  97.7 F (36.5 C)  97.8 F (36.6 C)  TempSrc:  Oral  Oral  SpO2:  95%  94%  Weight: 112.2 kg     Height:         Intake/Output Summary (Last 24 hours) at 09/06/2020 1354 Last data filed at 09/06/2020 0852 Gross per 24 hour  Intake 780 ml  Output 1100 ml  Net -320 ml    06/10 1901 - 06/12 0700 In: 1320 [P.O.:1317; I.V.:3] Out: 3650 [Urine:3650]  Filed Weights   09/04/20 0850 09/05/20 0407 09/06/20 0047  Weight: 114.4 kg 112.3 kg 112.2 kg    CBC:  Recent Labs  Lab 09/03/20 2234 09/05/20 0254  WBC 12.3* 10.0  HGB 14.2 13.5  HCT 44.8 40.7  PLT 196 166  MCV 101.1* 97.1  MCH 32.1 32.2  MCHC 31.7 33.2  RDW 13.2 13.2    Complete metabolic panel:  Recent Labs  Lab 09/03/20 2221 09/03/20 2256 09/04/20 0907 09/04/20 1239 09/04/20 1834 09/05/20 0254 09/06/20 0247  NA  135  --   --   --   --  134* 132*  K 4.2  --   --   --   --  4.0 3.8  CL 101  --   --   --   --  92* 94*  CO2 23  --   --   --   --  31 28  GLUCOSE 180*  --   --   --   --  122* 138*  BUN 12  --   --   --   --  13 17  CREATININE 0.91  --   --   --   --  1.05* 0.98  CALCIUM 8.5*  --   --   --   --  8.7* 8.7*  INR  --  1.8* 2.0*  --   --  2.0* 2.0*  TSH  --   --   --  3.381  --   --   --   HGBA1C  --   --   --   --  6.0*  --   --   BNP  --   --  540.9*  --   --   --   --     No results for input(s): LIPASE, AMYLASE in the last 168 hours.  Recent Labs  Lab 09/04/20 0734 09/04/20 0907  BNP  --  540.9*  SARSCOV2NAA NEGATIVE  --      ------------------------------------------------------------------------------------------------------------------ No results for input(s): CHOL, HDL, LDLCALC, TRIG, CHOLHDL, LDLDIRECT in the last 72 hours.  Lab Results  Component Value Date   HGBA1C 6.0 (H) 09/04/2020   ------------------------------------------------------------------------------------------------------------------ Recent Labs    09/04/20 1239  TSH 3.381   ------------------------------------------------------------------------------------------------------------------ No results for input(s): VITAMINB12, FOLATE, FERRITIN, TIBC, IRON, RETICCTPCT in the last 72 hours.  Coagulation profile Recent Labs  Lab 09/03/20 2256 09/04/20 0907 09/05/20 0254 09/06/20 0247  INR 1.8* 2.0* 2.0* 2.0*   No results for input(s): DDIMER in the last 72 hours.  Cardiac Enzymes No results for input(s): CKTOTAL, CKMB, CKMBINDEX, TROPONINI in the last 168 hours.  ------------------------------------------------------------------------------------------------------------------    Component Value Date/Time   BNP 540.9 (H) 09/04/2020 4970     Antibiotics: Anti-infectives (From admission, onward)    None        Radiology Reports  No results found.    DVT prophylaxis: Warfarin  Code Status: Full code  Family Communication: No family at bedside   Consultants: Cardiology  Procedures:     Objective    Physical Examination:  General-appears in no acute distress Heart-S1-S2, regular, no murmur auscultated Lungs-clear to auscultation bilaterally, no wheezing or crackles auscultated Abdomen-soft, nontender, no organomegaly Extremities-no edema in the lower extremities Neuro-alert, oriented x3, no focal deficit noted  Status is: Inpatient  Dispo: The patient is from: Home              Anticipated d/c is to: Skilled nursing facility              Anticipated d/c date is: 09/07/2020              Patient  currently not stable for discharge  Barrier to discharge-pending availability of skilled nursing facility  COVID-19 Labs  No results for input(s): DDIMER, FERRITIN, LDH, CRP in the last 72 hours.  Lab Results  Component Value Date   [place] NEGATIVE 09/04/2020    Microbiology  Recent Results (from the past 240 hour(s))  SARS CORONAVIRUS 2 (TAT 6-24 HRS) Nasopharyngeal Nasopharyngeal  Swab     Status: None   Collection Time: 09/04/20  7:34 AM   Specimen: Nasopharyngeal Swab  Result Value Ref Range Status   SARS Coronavirus 2 NEGATIVE NEGATIVE Final    Comment: (NOTE) SARS-CoV-2 target nucleic acids are NOT DETECTED.  The SARS-CoV-2 RNA is generally detectable in upper and lower respiratory specimens during the acute phase of infection. Negative results do not preclude SARS-CoV-2 infection, do not rule out co-infections with other pathogens, and should not be used as the sole basis for treatment or other patient management decisions. Negative results must be combined with clinical observations, patient history, and epidemiological information. The expected result is Negative.  Fact Sheet for Patients: SugarRoll.be  Fact Sheet for Healthcare Providers: https://www.woods-mathews.com/  This test is not yet approved or cleared by the [place] FDA and  has been authorized for detection and/or diagnosis of SARS-CoV-2 by FDA under an Emergency Use Authorization (EUA). This EUA will remain  in effect (meaning this test can be used) for the duration of the COVID-19 declaration under Se ction 564(b)(1) of the Act, 21 U.S.C. section 360bbb-3(b)(1), unless the authorization is terminated or revoked sooner.  Performed at [facility], [place] [address], [place], [place] [address]     Pressure Injury 03/24/17 (Active)  03/24/17 0200  Location: [place]  Location Orientation:   Staging:   Wound Description (Comments):   Present  on Admission:           [place]   [facility] If 7PM-7AM, please contact night-coverage at [email or web address], Office  [phone]   09/06/2020, 1:54 PM  LOS: 2 days

## 2020-09-07 DIAGNOSIS — W19XXXA Unspecified fall, initial encounter: Secondary | ICD-10-CM

## 2020-09-07 DIAGNOSIS — Y92009 Unspecified place in unspecified non-institutional (private) residence as the place of occurrence of the external cause: Secondary | ICD-10-CM

## 2020-09-07 LAB — BASIC METABOLIC PANEL
Anion gap: 7 (ref 5–15)
BUN: 22 mg/dL (ref 8–23)
CO2: 31 mmol/L (ref 22–32)
Calcium: 8.6 mg/dL — ABNORMAL LOW (ref 8.9–10.3)
Chloride: 96 mmol/L — ABNORMAL LOW (ref 98–111)
Creatinine, Ser: 1.11 mg/dL — ABNORMAL HIGH (ref 0.44–1.00)
GFR, Estimated: 52 mL/min — ABNORMAL LOW (ref >60)
Glucose, Bld: 116 mg/dL — ABNORMAL HIGH (ref 70–99)
Potassium: 4.1 mmol/L (ref 3.5–5.1)
Sodium: 134 mmol/L — ABNORMAL LOW (ref 135–145)

## 2020-09-07 LAB — PROTIME-INR
INR: 2.4 — ABNORMAL HIGH (ref 0.8–1.2)
Prothrombin Time: 26 seconds — ABNORMAL HIGH (ref 11.4–15.2)

## 2020-09-07 LAB — CBC
HCT: 42.9 % (ref 36.0–46.0)
Hemoglobin: 13.7 g/dL (ref 12.0–15.0)
MCH: 31.4 pg (ref 26.0–34.0)
MCHC: 31.9 g/dL (ref 30.0–36.0)
MCV: 98.4 fL (ref 80.0–100.0)
Platelets: 154 10*3/uL (ref 150–400)
RBC: 4.36 MIL/uL (ref 3.87–5.11)
RDW: 13.3 % (ref 11.5–15.5)
WBC: 10.5 10*3/uL (ref 4.0–10.5)
nRBC: 0 % (ref 0.0–0.2)

## 2020-09-07 LAB — SARS CORONAVIRUS 2 (TAT 6-24 HRS): SARS Coronavirus 2: NEGATIVE

## 2020-09-07 MED ORDER — METOPROLOL SUCCINATE ER 50 MG PO TB24
50.0000 mg | ORAL_TABLET | Freq: Every day | ORAL | Status: DC
  Administered 2020-09-08: 50 mg via ORAL
  Filled 2020-09-07: qty 1

## 2020-09-07 MED ORDER — WARFARIN SODIUM 2.5 MG PO TABS
4.5000 mg | ORAL_TABLET | Freq: Once | ORAL | Status: AC
  Administered 2020-09-07: 4.5 mg via ORAL
  Filled 2020-09-07: qty 1

## 2020-09-07 MED ORDER — METHOCARBAMOL 500 MG PO TABS
500.0000 mg | ORAL_TABLET | Freq: Four times a day (QID) | ORAL | Status: DC
  Administered 2020-09-07 – 2020-09-08 (×6): 500 mg via ORAL
  Filled 2020-09-07 (×6): qty 1

## 2020-09-07 NOTE — TOC Progression Note (Signed)
Transition of Care [facility]) - Progression Note    Patient Details  Name: [name] MRN: [id number] Date of Birth: [birth year]  Transition of Care [facility]) CM/SW [place], [place] Phone Number: 09/07/2020, 9:56 AM  Clinical Narrative:    CSW provided pt with bed offers. She was very happy [facility] offered and confirmed they were her choice. She asked that CSW follow up with her daughter to let her know. CSW spoke with Colletta [place], pt's daughter, she was also agreeable to [facility] and all questions were answered. CSW attempted to contact [facility], a voicemail was left. SW will continue to follow for DC needs.   Expected Discharge Plan: [place] Barriers to Discharge: Continued Medical Work up, SNF Pending bed offer  Expected Discharge Plan and Services Expected Discharge Plan: [place] In-house Referral: Clinical Social Work   Post Acute Care Choice: [place] Living arrangements for the past 2 months: Single Family Home                                       Social Determinants of Health (SDOH) Interventions    Readmission Risk Interventions No flowsheet data found.

## 2020-09-07 NOTE — Progress Notes (Signed)
Progress Note  Patient Name: [name] Date of Encounter: 09/07/2020  [facility] Cardiologist: Minus Breeding, MD   Subjective   Doing well other than sacral pain, awaiting placement in rehab facility. No chest pain or shortness of breath.  Inpatient Medications    Scheduled Meds:  aspirin EC  81 mg Oral Daily   FLUoxetine  20 mg Oral Daily   furosemide  40 mg Oral Daily   losartan  100 mg Oral Daily   methocarbamol  500 mg Oral QID   [START ON 09/08/2020] metoprolol succinate  50 mg Oral Daily   metoprolol tartrate  25 mg Oral BID   potassium chloride SA  10 mEq Oral Daily   sodium chloride flush  3 mL Intravenous Q12H   warfarin  4.5 mg Oral ONCE-1600   Warfarin - Pharmacist Dosing Inpatient   Does not apply q1600   Continuous Infusions:  sodium chloride     PRN Meds: sodium chloride, acetaminophen, loratadine, ondansetron (ZOFRAN) IV, sodium chloride flush, traMADol   Vital Signs    Vitals:   09/06/20 1135 09/06/20 2030 09/07/20 0512 09/07/20 0953  BP: 132/89 128/88 139/77 (!) 103/40  Pulse: 80 91 87 89  Resp: 20 16 16    Temp: 97.8 F (36.6 C) 98.7 F (37.1 C) 97.9 F (36.6 C)   TempSrc: Oral Oral Oral   SpO2: 94% 97% 95% 96%  Weight:   113 kg   Height:        Intake/Output Summary (Last 24 hours) at 09/07/2020 1052 Last data filed at 09/07/2020 0500 Gross per 24 hour  Intake 470 ml  Output 950 ml  Net -480 ml   Last 3 Weights 09/07/2020 09/06/2020 09/05/2020  Weight (lbs) 249 lb 1.9 oz 247 lb 5.7 oz 247 lb 8 oz  Weight (kg) 113 kg 112.2 kg 112.265 kg      Telemetry    Atrial fibrillation with artifact/lead loss- Personally Reviewed  ECG    No new since 09/03/2020- Personally Reviewed  Physical Exam   GEN: No acute distress.   Neck: No JVD Cardiac: irregularly irregular S1 and S2, no rubs, or gallops. Systolic ejection murmur Respiratory: Clear to auscultation bilaterally. GI: Soft, nontender, non-distended  MS: No edema; No  deformity. Neuro:  Nonfocal  Psych: Normal affect   Labs    High Sensitivity Troponin:  No results for input(s): TROPONINIHS in the last 720 hours.    Chemistry Recent Labs  Lab 09/05/20 0254 09/06/20 0247 09/07/20 0621  NA 134* 132* 134*  K 4.0 3.8 4.1  CL 92* 94* 96*  CO2 31 28 31   GLUCOSE 122* 138* 116*  BUN 13 17 22   CREATININE 1.05* 0.98 1.11*  CALCIUM 8.7* 8.7* 8.6*  GFRNONAA 56* >60 52*  ANIONGAP 11 10 7      Hematology Recent Labs  Lab 09/03/20 2234 09/05/20 0254 09/07/20 0621  WBC 12.3* 10.0 10.5  RBC 4.43 4.19 4.36  HGB 14.2 13.5 13.7  HCT 44.8 40.7 42.9  MCV 101.1* 97.1 98.4  MCH 32.1 32.2 31.4  MCHC 31.7 33.2 31.9  RDW 13.2 13.2 13.3  PLT 196 166 154    BNP Recent Labs  Lab 09/04/20 0907  BNP 540.9*     DDimer No results for input(s): DDIMER in the last 168 hours.   Radiology    No results found.  Cardiac Studies   Echo 09/04/20:  1. Left ventricular ejection fraction, by estimation, is 55 to 60%. The  left ventricle  has normal function. The left ventricle has no regional  wall motion abnormalities. There is mild concentric left ventricular  hypertrophy. Left ventricular diastolic  function could not be evaluated.   2. Right ventricular systolic function is moderately reduced. The right  ventricular size is moderately enlarged. There is mildly elevated  pulmonary artery systolic pressure.   3. Left atrial size was severely dilated.   4. Right atrial size was severely dilated.   5. The mitral valve is rheumatic. There is mild calcification of the  anterior and posterior mitral valve leaflet(s). Moderately decreased  mobility of the mitral valve leaflets. Mild mitral annular calcification.  Mild mitral valve regurgitation. Moderate   to severe mitral stenosis. The mean mitral valve gradient is 10.5 mmHg at  an average heart rate in atrial fibrillation of 78bpm.   6. The aortic valve was not well visualized. Aortic valve regurgitation   is mild. No aortic stenosis is present.   7. The inferior vena cava is normal in size with greater than 50%  respiratory variability, suggesting right atrial pressure of 3 mmHg.  Patient Profile     75 y.o. female with PMH permanent atrial fibrillation, PE/DVT with IVC filter with prior embolectomies, embolic CVA, mitral stenosis-->on coumadin for all the prior; nonischemic cardiomyopathy who is seen in consultation for heart failure at the request of Dr. Tamala Julian.  Assessment & Plan    Atrial fibrillation, permanent Mitral stenosis Acute on chronic diastolic heart failure secondary to the above -coumadin dose per pharmacy -CHA2DS2/VAS Stroke Risk Points= 6 -continue furosemide 40 mg oral daily  History of nonischemic cardiomyopathy, with normalized EF -continue beta blocker and ARB, will consolidate to metoprolol succinate  Hypertension: -well controlled on losartan and metoprolol  [facility] will sign off.   Medication Recommendations:  Metoprolol succinate 50 mg daily (replaces metoprolol tartrate 25 mg BID) Warfarin per pharmacy Furosemide 40 mg daily Losartan 100 mg daily Aspirin 81 mg daily  Other recommendations (labs, testing, etc):  none Follow up as an outpatient:  She has scheduled follow up with Dr. Percival Spanish on 6.30.22  For questions or updates, please contact [place] Please consult [email or web address] for contact info under        Signed, Buford Dresser, MD  09/07/2020, 10:52 AM

## 2020-09-07 NOTE — Progress Notes (Signed)
[facility]  PROGRESS NOTE  MARSHELLE BILGER CBS:[id number] DOB: [birth year] DOA: 09/03/2020 PCP: Josetta Huddle, MD   Brief HPI:   75 year old female with medical history of hypertension, systolic congestive heart failure, EF 40 to 45%, permanent atrial fibrillation on anticoagulation, right MCA stroke with hemorrhagic conversion in 2012, history of DVT and PE, HIT presented after having a fall at home.  In the ED patient was found to be hypoxemic with O2 sats 88% on room air with improvement on oxygen 2 L/min of nasal cannula, x-ray of lumbar spine noted mild diffuse degenerative disc disease.  X-ray of hips did not show any acute abnormality.    Subjective   Patient seen and examined, still has neck pain.  Though back pain is controlled with tramadol.   Assessment/Plan:     Acute hypoxemic respiratory failure -Secondary to acute systolic congestive heart failure -Received Lasix 40 mg IV x 2 yesterday -Breathing significantly improved -Cardiology following; recommend daily Lasix.   -Started on Lasix 40 mg p.o. daily   Permanent atrial fibrillation -Patient presented with subtherapeutic INR; improved to 2.4 this morning -Presented in atrial fibrillation with heart rate of 110 -Rate control was achieved after patient was given IV metoprolol -At this time heart rate is controlled, continue metoprolol 25 mg p.o. twice daily    S/p fall -No acute injuries noted on x-ray imaging of hips and lumbar spine -PT saw the patient, recommend skilled nursing facility rehab -Continue tramadol as needed, will add Robaxin 500 mg p.o. 4 times daily  for muscle spasm -TOC consulted for rehab placement   Hypertension -Blood pressure is stable -Continue metoprolol, losartan   History of CVA/PE/DVT -Continue aspirin and Coumadin   Scheduled medications:    aspirin EC  81 mg Oral Daily   FLUoxetine  20 mg Oral Daily   furosemide  40 mg Oral Daily   losartan  100 mg Oral Daily    methocarbamol  500 mg Oral QID   [START ON 09/08/2020] metoprolol succinate  50 mg Oral Daily   metoprolol tartrate  25 mg Oral BID   potassium chloride SA  10 mEq Oral Daily   sodium chloride flush  3 mL Intravenous Q12H   warfarin  4.5 mg Oral ONCE-1600   Warfarin - Pharmacist Dosing Inpatient   Does not apply q1600         Data Reviewed:   CBG:  Recent Labs  Lab 09/06/20 1139  GLUCAP 116*    SpO2: 97 % O2 Flow Rate (L/min): 1 L/min    Vitals:   09/06/20 2030 09/07/20 0512 09/07/20 0953 09/07/20 1130  BP: 128/88 139/77 (!) 103/40 (!) 117/94  Pulse: 91 87 89 65  Resp: 16 16  19   Temp: 98.7 F (37.1 C) 97.9 F (36.6 C)  98 F (36.7 C)  TempSrc: Oral Oral  Oral  SpO2: 97% 95% 96% 97%  Weight:  113 kg    Height:         Intake/Output Summary (Last 24 hours) at 09/07/2020 1328 Last data filed at 09/07/2020 0500 Gross per 24 hour  Intake 470 ml  Output 950 ml  Net -480 ml    06/11 1901 - 06/13 0700 In: 1010 [P.O.:1010] Out: 1900 [Urine:1900]  Filed Weights   09/05/20 0407 09/06/20 0047 09/07/20 0512  Weight: 112.3 kg 112.2 kg 113 kg    CBC:  Recent Labs  Lab 09/03/20 2234 09/05/20 0254 09/07/20 0621  WBC 12.3* 10.0 10.5  HGB 14.2 13.5  13.7  HCT 44.8 40.7 42.9  PLT 196 166 154  MCV 101.1* 97.1 98.4  MCH 32.1 32.2 31.4  MCHC 31.7 33.2 31.9  RDW 13.2 13.2 13.3    Complete metabolic panel:  Recent Labs  Lab 09/03/20 2221 09/03/20 2256 09/04/20 0907 09/04/20 1239 09/04/20 1834 09/05/20 0254 09/06/20 0247 09/07/20 0621  NA 135  --   --   --   --  134* 132* 134*  K 4.2  --   --   --   --  4.0 3.8 4.1  CL 101  --   --   --   --  92* 94* 96*  CO2 23  --   --   --   --  31 28 31   GLUCOSE 180*  --   --   --   --  122* 138* 116*  BUN 12  --   --   --   --  13 17 22   CREATININE 0.91  --   --   --   --  1.05* 0.98 1.11*  CALCIUM 8.5*  --   --   --   --  8.7* 8.7* 8.6*  INR  --  1.8* 2.0*  --   --  2.0* 2.0* 2.4*  TSH  --   --   --  3.381   --   --   --   --   HGBA1C  --   --   --   --  6.0*  --   --   --   BNP  --   --  540.9*  --   --   --   --   --     No results for input(s): LIPASE, AMYLASE in the last 168 hours.  Recent Labs  Lab 09/04/20 0734 09/04/20 0907  BNP  --  540.9*  SARSCOV2NAA NEGATIVE  --     ------------------------------------------------------------------------------------------------------------------ No results for input(s): CHOL, HDL, LDLCALC, TRIG, CHOLHDL, LDLDIRECT in the last 72 hours.  Lab Results  Component Value Date   HGBA1C 6.0 (H) 09/04/2020   ------------------------------------------------------------------------------------------------------------------ No results for input(s): TSH, T4TOTAL, T3FREE, THYROIDAB in the last 72 hours.  Invalid input(s): FREET3  ------------------------------------------------------------------------------------------------------------------ No results for input(s): VITAMINB12, FOLATE, FERRITIN, TIBC, IRON, RETICCTPCT in the last 72 hours.  Coagulation profile Recent Labs  Lab 09/03/20 2256 09/04/20 0907 09/05/20 0254 09/06/20 0247 09/07/20 0621  INR 1.8* 2.0* 2.0* 2.0* 2.4*   No results for input(s): DDIMER in the last 72 hours.  Cardiac Enzymes No results for input(s): CKTOTAL, CKMB, CKMBINDEX, TROPONINI in the last 168 hours.  ------------------------------------------------------------------------------------------------------------------    Component Value Date/Time   BNP 540.9 (H) 09/04/2020 1610     Antibiotics: Anti-infectives (From admission, onward)    None        Radiology Reports  No results found.    DVT prophylaxis: Warfarin  Code Status: Full code  Family Communication: No family at bedside   Consultants: Cardiology  Procedures:     Objective    Physical Examination:  General-appears in no acute distress Heart-S1-S2, regular, no murmur auscultated Lungs-clear to auscultation bilaterally,  no wheezing or crackles auscultated Abdomen-soft, nontender, no organomegaly Extremities-no edema in the lower extremities Neuro-alert, oriented x3, no focal deficit noted  Status is: Inpatient  Dispo: The patient is from: Home              Anticipated d/c is to: Skilled nursing facility  Anticipated d/c date is: 09/08/2020              Patient currently not stable for discharge  Barrier to discharge-pending availability of skilled nursing facility  COVID-19 Labs  No results for input(s): DDIMER, FERRITIN, LDH, CRP in the last 72 hours.  Lab Results  Component Value Date   [place] NEGATIVE 09/04/2020    Microbiology  Recent Results (from the past 240 hour(s))  SARS CORONAVIRUS 2 (TAT 6-24 HRS) Nasopharyngeal Nasopharyngeal Swab     Status: None   Collection Time: 09/04/20  7:34 AM   Specimen: Nasopharyngeal Swab  Result Value Ref Range Status   SARS Coronavirus 2 NEGATIVE NEGATIVE Final    Comment: (NOTE) SARS-CoV-2 target nucleic acids are NOT DETECTED.  The SARS-CoV-2 RNA is generally detectable in upper and lower respiratory specimens during the acute phase of infection. Negative results do not preclude SARS-CoV-2 infection, do not rule out co-infections with other pathogens, and should not be used as the sole basis for treatment or other patient management decisions. Negative results must be combined with clinical observations, patient history, and epidemiological information. The expected result is Negative.  Fact Sheet for Patients: SugarRoll.be  Fact Sheet for Healthcare Providers: https://www.woods-mathews.com/  This test is not yet approved or cleared by the [place] FDA and  has been authorized for detection and/or diagnosis of SARS-CoV-2 by FDA under an Emergency Use Authorization (EUA). This EUA will remain  in effect (meaning this test can be used) for the duration of the COVID-19 declaration  under Se ction 564(b)(1) of the Act, 21 U.S.C. section 360bbb-3(b)(1), unless the authorization is terminated or revoked sooner.  Performed at [facility], [place] [address], [place], [place] [address]     Pressure Injury 03/24/17 (Active)  03/24/17 0200  Location: [place]  Location Orientation:   Staging:   Wound Description (Comments):   Present on Admission:           [place]   [facility] If 7PM-7AM, please contact night-coverage at [email or web address], Office  [phone]   09/07/2020, 1:28 PM  LOS: 3 days

## 2020-09-07 NOTE — TOC Progression Note (Signed)
Transition of Care [facility]) - Progression Note    Patient Details  Name: [name] MRN: [id number] Date of Birth: Jan 09, 1946  Transition of Care [facility]) CM/SW [place], [place] Phone Number: 09/07/2020, 3:29 PM  Clinical Narrative:     CSW spoke to admissions coordinator at [facility]. They can accept pt tomorrow. CSW requested covid test from MD.   Expected Discharge Plan: [place] Barriers to Discharge: Continued Medical Work up, SNF Pending bed offer  Expected Discharge Plan and Services Expected Discharge Plan: [place] In-house Referral: Clinical Social Work   Post Acute Care Choice: [place] Living arrangements for the past 2 months: Single Family Home                                       Social Determinants of Health (SDOH) Interventions    Readmission Risk Interventions No flowsheet data found.

## 2020-09-07 NOTE — Progress Notes (Signed)
ANTICOAGULATION CONSULT NOTE - [place] for Warfarin Indication: AF, History of PE, popliteal artery embolus  Allergies  Allergen Reactions   Heparin Hives    Patient attests severe allergy- rash all over, severe itching, unable to take heparin   Other Itching and Rash    "Sheets and Linens used by [name]"   Patient Measurements: Height: 5\' 6"  (167.6 cm) Weight: 113 kg (249 lb 1.9 oz) IBW/kg (Calculated) : 59.3 Vital Signs: Temp: 97.9 F (36.6 C) (06/13 0512) Temp Source: Oral (06/13 0512) BP: 139/77 (06/13 0512) Pulse Rate: 87 (06/13 0512)  Labs: Recent Labs    09/05/20 0254 09/06/20 0247 09/07/20 0621  HGB 13.5  --  13.7  HCT 40.7  --  42.9  PLT 166  --  154  LABPROT 22.3* 22.5* 26.0*  INR 2.0* 2.0* 2.4*  CREATININE 1.05* 0.98 1.11*    Estimated Creatinine Clearance: 56.7 mL/min (A) (by C-G formula based on SCr of 1.11 mg/dL (H)).  Assessment: 75 yo female taking warfarin (Indication: AF, History of PE, popliteal artery embolus). INR Goal 2.5-3.5 per last anticoag visit May '22. INR on admit (6/9) was subtherapeutic at 1.8.  INR slightly below goal this morning at 2.4. CBC stable/WNL.  Home dose: 6 mg (3 mg x 2) every Wed; 4.5 mg (3 mg x 1.5) all other days.  Warfarin this admission: 6/9: INR 1.8 6/10: INR 2; 6mg  6/11: INR 2; 6 mg 6/12: INR 2; 6 mg 6/13: INR 2.4 - Near goal  Goal of Therapy:  INR 2.5-3.5 Monitor CBC and INR   Plan:  Warfarin 4.5mg  tonight - expect to resume home regimen 6/14 pending AM labs - Suspect goal INR in AM - Conservative management given history of hemorrhage Daily PT/INR  Lorelei Pont, PharmD, BCPS 09/07/2020 8:41 AM Clinical Pharmacist -  [phone]

## 2020-09-08 DIAGNOSIS — R29898 Other symptoms and signs involving the musculoskeletal system: Secondary | ICD-10-CM | POA: Diagnosis not present

## 2020-09-08 DIAGNOSIS — I050 Rheumatic mitral stenosis: Secondary | ICD-10-CM | POA: Diagnosis not present

## 2020-09-08 DIAGNOSIS — M533 Sacrococcygeal disorders, not elsewhere classified: Secondary | ICD-10-CM | POA: Diagnosis not present

## 2020-09-08 DIAGNOSIS — D1724 Benign lipomatous neoplasm of skin and subcutaneous tissue of left leg: Secondary | ICD-10-CM | POA: Diagnosis not present

## 2020-09-08 DIAGNOSIS — Z9181 History of falling: Secondary | ICD-10-CM | POA: Diagnosis not present

## 2020-09-08 DIAGNOSIS — R2242 Localized swelling, mass and lump, left lower limb: Secondary | ICD-10-CM | POA: Diagnosis not present

## 2020-09-08 DIAGNOSIS — E642 Sequelae of vitamin C deficiency: Secondary | ICD-10-CM | POA: Diagnosis not present

## 2020-09-08 DIAGNOSIS — I739 Peripheral vascular disease, unspecified: Secondary | ICD-10-CM | POA: Diagnosis not present

## 2020-09-08 DIAGNOSIS — I4821 Permanent atrial fibrillation: Secondary | ICD-10-CM | POA: Diagnosis not present

## 2020-09-08 DIAGNOSIS — Z86711 Personal history of pulmonary embolism: Secondary | ICD-10-CM | POA: Diagnosis not present

## 2020-09-08 DIAGNOSIS — F329 Major depressive disorder, single episode, unspecified: Secondary | ICD-10-CM | POA: Diagnosis not present

## 2020-09-08 DIAGNOSIS — Z7401 Bed confinement status: Secondary | ICD-10-CM | POA: Diagnosis not present

## 2020-09-08 DIAGNOSIS — Z7901 Long term (current) use of anticoagulants: Secondary | ICD-10-CM | POA: Diagnosis not present

## 2020-09-08 DIAGNOSIS — I5020 Unspecified systolic (congestive) heart failure: Secondary | ICD-10-CM | POA: Diagnosis not present

## 2020-09-08 DIAGNOSIS — I342 Nonrheumatic mitral (valve) stenosis: Secondary | ICD-10-CM | POA: Diagnosis not present

## 2020-09-08 DIAGNOSIS — W19XXXD Unspecified fall, subsequent encounter: Secondary | ICD-10-CM | POA: Diagnosis not present

## 2020-09-08 DIAGNOSIS — Z86718 Personal history of other venous thrombosis and embolism: Secondary | ICD-10-CM | POA: Diagnosis not present

## 2020-09-08 DIAGNOSIS — I5022 Chronic systolic (congestive) heart failure: Secondary | ICD-10-CM | POA: Diagnosis not present

## 2020-09-08 DIAGNOSIS — D6869 Other thrombophilia: Secondary | ICD-10-CM | POA: Diagnosis not present

## 2020-09-08 DIAGNOSIS — I509 Heart failure, unspecified: Secondary | ICD-10-CM | POA: Diagnosis not present

## 2020-09-08 DIAGNOSIS — S300XXA Contusion of lower back and pelvis, initial encounter: Secondary | ICD-10-CM | POA: Diagnosis not present

## 2020-09-08 DIAGNOSIS — M7989 Other specified soft tissue disorders: Secondary | ICD-10-CM | POA: Diagnosis not present

## 2020-09-08 DIAGNOSIS — R2681 Unsteadiness on feet: Secondary | ICD-10-CM | POA: Diagnosis not present

## 2020-09-08 DIAGNOSIS — M6281 Muscle weakness (generalized): Secondary | ICD-10-CM | POA: Diagnosis not present

## 2020-09-08 DIAGNOSIS — I5023 Acute on chronic systolic (congestive) heart failure: Secondary | ICD-10-CM | POA: Diagnosis not present

## 2020-09-08 DIAGNOSIS — E669 Obesity, unspecified: Secondary | ICD-10-CM | POA: Diagnosis not present

## 2020-09-08 DIAGNOSIS — R0902 Hypoxemia: Secondary | ICD-10-CM | POA: Diagnosis not present

## 2020-09-08 DIAGNOSIS — W19XXXA Unspecified fall, initial encounter: Secondary | ICD-10-CM | POA: Diagnosis not present

## 2020-09-08 DIAGNOSIS — M62838 Other muscle spasm: Secondary | ICD-10-CM | POA: Diagnosis not present

## 2020-09-08 DIAGNOSIS — Z8673 Personal history of transient ischemic attack (TIA), and cerebral infarction without residual deficits: Secondary | ICD-10-CM | POA: Diagnosis not present

## 2020-09-08 DIAGNOSIS — I10 Essential (primary) hypertension: Secondary | ICD-10-CM | POA: Diagnosis not present

## 2020-09-08 DIAGNOSIS — I4891 Unspecified atrial fibrillation: Secondary | ICD-10-CM | POA: Diagnosis not present

## 2020-09-08 DIAGNOSIS — J9601 Acute respiratory failure with hypoxia: Secondary | ICD-10-CM | POA: Diagnosis not present

## 2020-09-08 DIAGNOSIS — I743 Embolism and thrombosis of arteries of the lower extremities: Secondary | ICD-10-CM | POA: Diagnosis not present

## 2020-09-08 DIAGNOSIS — R279 Unspecified lack of coordination: Secondary | ICD-10-CM | POA: Diagnosis not present

## 2020-09-08 LAB — PROTIME-INR
INR: 2.9 — ABNORMAL HIGH (ref 0.8–1.2)
Prothrombin Time: 30.5 seconds — ABNORMAL HIGH (ref 11.4–15.2)

## 2020-09-08 LAB — BASIC METABOLIC PANEL
Anion gap: 10 (ref 5–15)
BUN: 22 mg/dL (ref 8–23)
CO2: 28 mmol/L (ref 22–32)
Calcium: 8.9 mg/dL (ref 8.9–10.3)
Chloride: 96 mmol/L — ABNORMAL LOW (ref 98–111)
Creatinine, Ser: 1.06 mg/dL — ABNORMAL HIGH (ref 0.44–1.00)
GFR, Estimated: 55 mL/min — ABNORMAL LOW (ref >60)
Glucose, Bld: 110 mg/dL — ABNORMAL HIGH (ref 70–99)
Potassium: 4 mmol/L (ref 3.5–5.1)
Sodium: 134 mmol/L — ABNORMAL LOW (ref 135–145)

## 2020-09-08 MED ORDER — METHOCARBAMOL 500 MG PO TABS
500.0000 mg | ORAL_TABLET | Freq: Four times a day (QID) | ORAL | Status: DC | PRN
Start: 2020-09-08 — End: 2020-12-23

## 2020-09-08 MED ORDER — METOPROLOL SUCCINATE ER 50 MG PO TB24
50.0000 mg | ORAL_TABLET | Freq: Every day | ORAL | Status: DC
Start: 2020-09-09 — End: 2020-12-09

## 2020-09-08 MED ORDER — WARFARIN SODIUM 2.5 MG PO TABS
4.5000 mg | ORAL_TABLET | Freq: Once | ORAL | Status: AC
  Administered 2020-09-08: 4.5 mg via ORAL
  Filled 2020-09-08: qty 1

## 2020-09-08 MED ORDER — TRAMADOL HCL 50 MG PO TABS: 50.0000 mg | ORAL_TABLET | Freq: Four times a day (QID) | ORAL | 0 refills | Status: DC | PRN

## 2020-09-08 NOTE — Progress Notes (Signed)
Occupational Therapy Treatment Patient Details Name: [name] MRN: [id number] DOB: [birth year] Today's Date: 09/08/2020    History of present illness Pt is a 75 y/o female admitted 09/03/20 presenting after fall at home. CT head negative. X-ray spine/hips with no acute abnormality. Admitted for acute respiratory failure with hypoxia due to systolic CHF exacerbation. PMH includeS: HTN, systolic CHF, Afib, R MCA stroke with hemorrhagic conversion, hx of DVT/PE, and HIT.   OT comments  Pt presented sitting in the chair. Pt at this time reported it took it out of her to get to the chair. Pt with set up complete oral care. Pt attempted to complete sit to stand transfers but was limited by pain. Pt was educated about breathing techniques with pain and positioning. Pt was able to readjust in chair and scoot posterior. Pt agrees with going to SNF level at this time.       Follow Up Recommendations  SNF;Supervision/Assistance - 24 hour    Equipment Recommendations  3 in 1 bedside commode    Recommendations for Other Services      Precautions / Restrictions Precautions Precautions: Fall Precaution Comments: back pain could be treated as precautions for comfort Restrictions Weight Bearing Restrictions: No       Mobility Bed Mobility Overal bed mobility:  (presented in chair)                  Transfers Overall transfer level: Needs assistance Equipment used: Standard walker             General transfer comment: pt attmepted with therapist but unable to complete    Balance                                           ADL either performed or assessed with clinical judgement   ADL Overall ADL's : Needs assistance/impaired     Grooming: Wash/dry hands;Wash/dry face;Oral care;Set up;Sitting   Upper Body Bathing: Minimal assistance;Cueing for safety;Cueing for sequencing;Sitting   Lower Body Bathing: Total assistance (sitting)                          General ADL Comments: attmpeted to complete transfers but limited due to pain     Vision   Vision Assessment?: No apparent visual deficits   Perception     Praxis      Cognition Arousal/Alertness: Awake/alert Behavior During Therapy: Anxious Overall Cognitive Status: Within Functional Limits for tasks assessed                                          Exercises     Shoulder Instructions       General Comments      Pertinent Vitals/ Pain       Pain Assessment: 0-10 Faces Pain Scale: Hurts whole lot Pain Location: lower sacrum Pain Descriptors / Indicators: Grimacing;Guarding Pain Intervention(s): Limited activity within patient's tolerance;Monitored during session;Premedicated before session  Home Living                                          Prior Functioning/Environment  Frequency  Min 2X/week        Progress Toward Goals  OT Goals(current goals can now be found in the care plan section)  Progress towards OT goals: Progressing toward goals  Acute Rehab OT Goals Patient Stated Goal: less pain OT Goal Formulation: With patient Time For Goal Achievement: 09/19/20 Potential to Achieve Goals: Good ADL Goals Pt Will Perform Grooming: with set-up;sitting Pt Will Perform Lower Body Dressing: with mod assist;sit to/from stand;with adaptive equipment Pt Will Transfer to Toilet: with min assist;bedside commode;stand pivot transfer Pt Will Perform Toileting - Clothing Manipulation and hygiene: with mod assist;sitting/lateral leans;sit to/from stand Additional ADL Goal #1: Pt will complete bed mobility with min assist and maintain dynamic sitting balance with min guard for 5 minutes as precursor to ADLs.  Plan Discharge plan remains appropriate    Co-evaluation                 AM-PAC OT "6 Clicks" Daily Activity     Outcome Measure   Help from another person eating meals?: None Help from  another person taking care of personal grooming?: A Little Help from another person toileting, which includes using toliet, bedpan, or urinal?: Total Help from another person bathing (including washing, rinsing, drying)?: A Lot Help from another person to put on and taking off regular upper body clothing?: A Little Help from another person to put on and taking off regular lower body clothing?: Total 6 Click Score: 14    End of Session    OT Visit Diagnosis: Other abnormalities of gait and mobility (R26.89);Muscle weakness (generalized) (M62.81);History of falling (Z91.81);Pain   Activity Tolerance Patient limited by pain   Patient Left in chair;with call bell/phone within reach   Nurse Communication          Time: 2637-8588 OT Time Calculation (min): 33 min  Charges: OT General Charges $OT Visit: 1 Visit OT Treatments $Self Care/Home Management : 23-37 mins   Joeseph Amor OTR/L  Acute Rehab Services  [phone] office number [phone] pager number    Joeseph Amor 09/08/2020, 11:33 AM

## 2020-09-08 NOTE — Progress Notes (Signed)
Report given to [facility] all questions answered. Patient d/c per order.

## 2020-09-08 NOTE — Discharge Summary (Signed)
Physician Discharge Summary  [name] XTG:[id number] DOB: 12-04-1945 DOA: 09/03/2020  PCP: Josetta Huddle, MD  Admit date: 09/03/2020 Discharge date: 09/08/2020  Time spent: 60 minutes  Recommendations for Outpatient Follow-up:  Follow-up PCP in 2 weeks   Discharge Diagnoses:  Principal Problem:   Acute respiratory failure with hypoxia [facility]) Active Problems:   Permanent atrial fibrillation ([place])   Long term (current) use of anticoagulants   History of CVA (cerebrovascular accident)   Essential hypertension   CHF (congestive heart failure) ([place])   Fall at home, initial encounter   Mitral valve stenosis   Coccyx pain   Discharge Condition: Stable  Diet recommendation: Heart healthy diet  Filed Weights   09/07/20 0512 09/08/20 0033 09/08/20 0500  Weight: 113 kg 109.9 kg 109.9 kg    History of present illness:  75 year old female with medical history of hypertension, systolic congestive heart failure, EF 40 to 45%, permanent atrial fibrillation on anticoagulation, right MCA stroke with hemorrhagic conversion in 2012, history of DVT and PE, HIT presented after having a fall at home.   In the ED patient was found to be hypoxemic with O2 sats 88% on room air with improvement on oxygen 2 L/min of nasal cannula, x-ray of lumbar spine noted mild diffuse degenerative disc disease.  X-ray of hips did not show any acute abnormality.  Hospital Course:   Acute hypoxemic respiratory failure - resolved -Secondary to acute systolic congestive heart failure -Received Lasix 40 mg IV x 2  -Breathing significantly improved -Cardiology following; recommend daily Lasix.   -Started on Lasix 40 mg p.o. daily     Permanent atrial fibrillation -Patient presented with subtherapeutic INR; improved to 2.4 this morning -Presented in atrial fibrillation with heart rate of 110 -Rate control was achieved after patient was given IV metoprolol -At this time heart rate is controlled, continue  metoprolol succinate 50 mg daily       S/p fall -No acute injuries noted on x-ray imaging of hips and lumbar spine -PT saw the patient, recommend skilled nursing facility rehab -Continue tramadol as needed, will add Robaxin 500 mg p.o. 4 times daily  for muscle spasm -Will be discharged to rehab facility      Hypertension -Blood pressure is stable -Continue metoprolol, losartan     History of CVA/PE/DVT -Continue aspirin and Coumadin -INR is therapeutic    Procedures:   Consultations: Cardiology  Discharge Exam: Vitals:   09/08/20 0600 09/08/20 0810  BP:  140/80  Pulse:  81  Resp: 18 17  Temp:  98.6 F (37 C)  SpO2:  96%    General: Appears in no acute distress Cardiovascular: S1-S2, regular Respiratory: Clear to auscultation bilaterally  Discharge Instructions   Discharge Instructions     Diet - low sodium heart healthy   Complete by: As directed    Increase activity slowly   Complete by: As directed       Allergies as of 09/08/2020       Reactions   Heparin Hives   Patient attests severe allergy- rash all over, severe itching, unable to take heparin   Other Itching, Rash   "Sheets and Linens used by Zacarias Pontes"        Medication List     STOP taking these medications    metoprolol tartrate 25 MG tablet Commonly known as: LOPRESSOR       TAKE these medications    acetaminophen 500 MG tablet Commonly known as: TYLENOL Take 1,000 mg by mouth  every 6 (six) hours as needed for mild pain.   aspirin 81 MG tablet Take 81 mg by mouth daily.   cetirizine 10 MG tablet Commonly known as: ZYRTEC Take 10 mg by mouth daily as needed for allergies.   FLUoxetine 20 MG capsule Commonly known as: PROZAC Take 20 mg by mouth daily.   furosemide 40 MG tablet Commonly known as: LASIX Take 1 tablet (40 mg total) by mouth daily.   lidocaine 5 % Commonly known as: Lidoderm Place 1 patch onto the skin daily. Remove & Discard patch within 12  hours or as directed by MD   losartan 100 MG tablet Commonly known as: COZAAR Take 1 tablet by mouth once daily   methocarbamol 500 MG tablet Commonly known as: ROBAXIN Take 1 tablet (500 mg total) by mouth every 6 (six) hours as needed for muscle spasms.   metoprolol succinate 50 MG 24 hr tablet Commonly known as: TOPROL-XL Take 1 tablet (50 mg total) by mouth daily. Take with or immediately following a meal. Start taking on: September 09, 2020   potassium chloride 10 MEQ tablet Commonly known as: KLOR-CON TAKE 1 TABLET BY MOUTH ONCE DAILY   traMADol 50 MG tablet Commonly known as: ULTRAM Take 1 tablet (50 mg total) by mouth every 6 (six) hours as needed for moderate pain.   VITA-C PO Take 500 mg by mouth daily.   Vitamin D3 125 MCG (5000 UT) Caps Take 5,000 Units by mouth daily.   warfarin 3 MG tablet Commonly known as: COUMADIN Take as directed. If you are unsure how to take this medication, talk to your nurse or doctor. Original instructions: TAKE 1 & 1/2 TO 2 (ONE & ONE-HALF TO TWO) TABLETS BY MOUTH ONCE DAILY AS DIRECTED BY  COUMADIN  CLINIC What changed:  how much to take how to take this when to take this additional instructions       Allergies  Allergen Reactions   Heparin Hives    Patient attests severe allergy- rash all over, severe itching, unable to take heparin   Other Itching and Rash    "Sheets and Linens used by Zacarias Pontes"    Follow-up Information     Minus Breeding, MD Follow up on 09/24/2020.   Specialty: Cardiology Why: 3:20, hospital follow up Contact information: [place] [address] [phone]                  The results of significant diagnostics from this hospitalization (including imaging, microbiology, ancillary and laboratory) are listed below for reference.    Significant Diagnostic Studies: DG Lumbar Spine Complete  Result Date: 09/04/2020 CLINICAL DATA:  Fall, back pain EXAM: LUMBAR  SPINE - COMPLETE 4+ VIEW COMPARISON:  None. FINDINGS: Normal lumbar lordosis. No acute fracture or listhesis of the lumbar spine. Vertebral body height is preserved. There is diffuse mild intervertebral disc space narrowing and endplate remodeling throughout the lumbar spine in keeping with changes of diffuse mild degenerative disc disease. Fracture TrapEase inferior vena cava filter again noted in the expected infrarenal cava. The paraspinal soft tissues are otherwise unremarkable. IMPRESSION: Mild diffuse degenerative disc disease. No acute fracture or listhesis. Electronically Signed   By: Fidela Salisbury MD   On: 09/04/2020 01:15   CT Head Wo Contrast  Result Date: 09/04/2020 CLINICAL DATA:  Head injury, fall EXAM: CT HEAD WITHOUT CONTRAST TECHNIQUE: Contiguous axial images were obtained from the base of the skull through the vertex without intravenous contrast.  COMPARISON:  None. FINDINGS: Brain: Normal anatomic configuration. Parenchymal volume loss is commensurate with the patient's age. Mild periventricular white matter changes are present likely reflecting the sequela of small vessel ischemia. 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 There is focal encephalomalacia within the right basal ganglia, caudate body and corona radiata related to intraparenchymal hematoma noted on 06/03/2010, unchanged from prior examination. Tiny remote lacunar infarct noted within the right cerebellar hemisphere. No abnormal intra or extra-axial mass lesion or fluid collection. No abnormal mass effect or midline shift. No evidence of acute intracranial hemorrhage or infarct. Mild ex vacuo dilation of the right lateral ventricle. Ventricular size is otherwise normal. Cerebellum unremarkable. Vascular: No asymmetric hyperdense vasculature at the skull base. Skull: Intact Sinuses/Orbits: Paranasal sinuses are clear. Orbits are unremarkable. Other: Mastoid air cells and middle ear cavities are clear.  IMPRESSION: No acute intracranial injury.  No calvarial fracture. Stable mild senescent change and remote infarcts as outlined above. Electronically Signed   By: Fidela Salisbury MD   On: 09/04/2020 01:13   DG Chest Portable 1 View  Result Date: 09/04/2020 CLINICAL DATA:  Fall, chest injury EXAM: PORTABLE CHEST 1 VIEW COMPARISON:  03/28/2017 FINDINGS: The left costophrenic angle is excluded from view. The lungs are symmetrically expanded. Mild to moderate cardiomegaly is again noted. There is central pulmonary vascular congestion in keeping with early cardiogenic failure or mild volume overload, new since prior examination. No superimposed overt pulmonary edema. No pneumothorax or definite pleural effusion. No acute bone abnormality. IMPRESSION: Technically limited examination without radiographic evidence of acute cardiopulmonary disease. Stable mild-to-moderate cardiomegaly with development of pulmonary vascular congestion Electronically Signed   By: Fidela Salisbury MD   On: 09/04/2020 01:09   ECHOCARDIOGRAM COMPLETE  Result Date: 09/04/2020    ECHOCARDIOGRAM REPORT   Patient Name:   Aviendha RASHEEN SCHEWE Date of Exam: 09/04/2020 Medical Rec #:  [id number]       Height:       66.0 in Accession #:    [id number]      Weight:       220.0 lb Date of Birth:  1945/11/25       BSA:          2.082 m Patient Age:    18 years        BP:           123/82 mmHg Patient Gender: F               HR:           75 bpm. Exam Location:  Inpatient Procedure: 2D Echo Indications:    CHF  History:        Patient has prior history of Echocardiogram examinations, most                 recent 03/26/2017. CHF; Risk Factors:Hypertension.  Sonographer:    Maudry Mayhew MHA, RDMS, RVT, RDCS Referring Phys: [id number] [facility]  Sonographer Comments: Technically challenging study due to limited acoustic windows. Image acquisition challenging due to patient body habitus. IMPRESSIONS  1. Left ventricular ejection fraction, by estimation, is  55 to 60%. The left ventricle has normal function. The left ventricle has no regional wall motion abnormalities. There is mild concentric left ventricular hypertrophy. Left ventricular diastolic function could not be evaluated.  2. Right ventricular systolic function is moderately reduced. The right ventricular size is moderately enlarged. There is mildly elevated pulmonary artery systolic pressure.  3. Left  atrial size was severely dilated.  4. Right atrial size was severely dilated.  5. The mitral valve is rheumatic. There is mild calcification of the anterior and posterior mitral valve leaflet(s). Moderately decreased mobility of the mitral valve leaflets. Mild mitral annular calcification. Mild mitral valve regurgitation. Moderate  to severe mitral stenosis. The mean mitral valve gradient is 10.5 mmHg at an average heart rate in atrial fibrillation of 78bpm.  6. The aortic valve was not well visualized. Aortic valve regurgitation is mild. No aortic stenosis is present.  7. The inferior vena cava is normal in size with greater than 50% respiratory variability, suggesting right atrial pressure of 3 mmHg. FINDINGS  Left Ventricle: Left ventricular ejection fraction, by estimation, is 55 to 60%. The left ventricle has normal function. The left ventricle has no regional wall motion abnormalities. Definity contrast agent was given IV to delineate the left ventricular  endocardial borders. The left ventricular internal cavity size was normal in size. There is mild concentric left ventricular hypertrophy. Left ventricular diastolic function could not be evaluated due to atrial fibrillation. Left ventricular diastolic function could not be evaluated. Right Ventricle: The right ventricular size is moderately enlarged. No increase in right ventricular wall thickness. Right ventricular systolic function is moderately reduced. There is mildly elevated pulmonary artery systolic pressure. The tricuspid regurgitant velocity is  3.17 m/s, and with an assumed right atrial pressure of 3 mmHg, the estimated right ventricular systolic pressure is 89.1 mmHg. Left Atrium: Left atrial size was severely dilated. Right Atrium: Right atrial size was severely dilated. Pericardium: There is no evidence of pericardial effusion. Mitral Valve: The mitral valve is rheumatic. There is mild calcification of the anterior and posterior mitral valve leaflet(s). Moderately decreased mobility of the mitral valve leaflets. Mild mitral annular calcification. Mild mitral valve regurgitation. Moderate to severe mitral valve stenosis. MV peak gradient, 18.7 mmHg. The mean mitral valve gradient is 10.5 mmHg at an average heart rate of 78bpm. Tricuspid Valve: The tricuspid valve is normal in structure. Tricuspid valve regurgitation is mild . No evidence of tricuspid stenosis. Aortic Valve: The aortic valve was not well visualized. Aortic valve regurgitation is mild. No aortic stenosis is present. Aortic valve mean gradient measures 6.5 mmHg. Aortic valve peak gradient measures 14.1 mmHg. Aortic valve area, by VTI measures 0.91 cm. Pulmonic Valve: The pulmonic valve was not well visualized. Pulmonic valve regurgitation is not visualized. No evidence of pulmonic stenosis. Aorta: The aortic root is normal in size and structure. Venous: The inferior vena cava is normal in size with greater than 50% respiratory variability, suggesting right atrial pressure of 3 mmHg. IAS/Shunts: No atrial level shunt detected by color flow Doppler.  LEFT VENTRICLE PLAX 2D LVIDd:         4.70 cm LVIDs:         3.40 cm LV PW:         1.20 cm LV IVS:        1.20 cm LVOT diam:     1.80 cm LV SV:         35 LV SV Index:   17 LVOT Area:     2.54 cm  RIGHT VENTRICLE TAPSE (M-mode): 1.3 cm LEFT ATRIUM              Index        RIGHT ATRIUM           Index LA diam:        6.50 cm  3.12 cm/m  RA Area:     31.70 cm LA Vol (A2C):   168.0 ml 80.67 ml/m  RA Volume:   116.00 ml 55.70 ml/m LA Vol  (A4C):   217.0 ml 104.20 ml/m LA Biplane Vol: 200.0 ml 96.04 ml/m  AORTIC VALVE AV Area (Vmax):    0.96 cm AV Area (Vmean):   0.93 cm AV Area (VTI):     0.91 cm AV Vmax:           187.50 cm/s AV Vmean:          121.000 cm/s AV VTI:            0.384 m AV Peak Grad:      14.1 mmHg AV Mean Grad:      6.5 mmHg LVOT Vmax:         70.90 cm/s LVOT Vmean:        44.300 cm/s LVOT VTI:          0.137 m LVOT/AV VTI ratio: 0.36  AORTA Ao Root diam: 3.10 cm MITRAL VALVE              TRICUSPID VALVE MV Area VTI:  0.56 cm    TR Peak grad:   40.2 mmHg MV Peak grad: 18.7 mmHg   TR Vmax:        317.00 cm/s MV Mean grad: 10.5 mmHg MV Vmax:      2.16 m/s    SHUNTS MV Vmean:     154.5 cm/s  Systemic VTI:  0.14 m MR Peak grad: 70.6 mmHg   Systemic Diam: 1.80 cm MR Mean grad: 66.0 mmHg MR Vmax:      420.20 cm/s MR Vmean:     389.0 cm/s Fransico Him MD Electronically signed by Fransico Him MD Signature Date/Time: 09/04/2020/11:41:14 AM    Final    DG HIP UNILAT WITH PELVIS 2-3 VIEWS LEFT  Result Date: 09/04/2020 CLINICAL DATA:  Fall, left hip pain EXAM: DG HIP (WITH OR WITHOUT PELVIS) 2-3V LEFT COMPARISON:  None. FINDINGS: Normal alignment. No fracture or dislocation. Mild left hip degenerative arthritis. Surgical clips are seen surrounding the left hip. IMPRESSION: No acute fracture or dislocation. Electronically Signed   By: Fidela Salisbury MD   On: 09/04/2020 01:18   DG HIP UNILAT WITH PELVIS 2-3 VIEWS RIGHT  Result Date: 09/04/2020 CLINICAL DATA:  Fall buttock pain EXAM: DG HIP (WITH OR WITHOUT PELVIS) 2-3V RIGHT COMPARISON:  09/19/2019 FINDINGS: Single view radiograph of the pelvis and two view radiograph of the right hip demonstrates normal alignment. No fracture or dislocation. Mild right hip degenerative arthritis, stable since prior examination. Multiple surgical clips noted surrounding the left hip. IMPRESSION: No acute fracture or dislocation. Electronically Signed   By: Fidela Salisbury MD   On: 09/04/2020 01:17     Microbiology: Recent Results (from the past 240 hour(s))  SARS CORONAVIRUS 2 (TAT 6-24 HRS) Nasopharyngeal Nasopharyngeal Swab     Status: None   Collection Time: 09/04/20  7:34 AM   Specimen: Nasopharyngeal Swab  Result Value Ref Range Status   SARS Coronavirus 2 NEGATIVE NEGATIVE Final    Comment: (NOTE) SARS-CoV-2 target nucleic acids are NOT DETECTED.  The SARS-CoV-2 RNA is generally detectable in upper and lower respiratory specimens during the acute phase of infection. Negative results do not preclude SARS-CoV-2 infection, do not rule out co-infections with other pathogens, and should not be used as the sole basis for treatment or other patient management decisions. Negative results must be combined with  clinical observations, patient history, and epidemiological information. The expected result is Negative.  Fact Sheet for Patients: SugarRoll.be  Fact Sheet for Healthcare Providers: https://www.woods-mathews.com/  This test is not yet approved or cleared by the [place] FDA and  has been authorized for detection and/or diagnosis of SARS-CoV-2 by FDA under an Emergency Use Authorization (EUA). This EUA will remain  in effect (meaning this test can be used) for the duration of the COVID-19 declaration under Se ction 564(b)(1) of the Act, 21 U.S.C. section 360bbb-3(b)(1), unless the authorization is terminated or revoked sooner.  Performed at [facility], [place] [address], [place], [place] [address]   SARS CORONAVIRUS 2 (TAT 6-24 HRS) Nasopharyngeal Nasopharyngeal Swab     Status: None   Collection Time: 09/07/20  1:28 PM   Specimen: Nasopharyngeal Swab  Result Value Ref Range Status   SARS Coronavirus 2 NEGATIVE NEGATIVE Final    Comment: (NOTE) SARS-CoV-2 target nucleic acids are NOT DETECTED.  The SARS-CoV-2 RNA is generally detectable in upper and lower respiratory specimens during the acute phase of infection.  Negative results do not preclude SARS-CoV-2 infection, do not rule out co-infections with other pathogens, and should not be used as the sole basis for treatment or other patient management decisions. Negative results must be combined with clinical observations, patient history, and epidemiological information. The expected result is Negative.  Fact Sheet for Patients: SugarRoll.be  Fact Sheet for Healthcare Providers: https://www.woods-mathews.com/  This test is not yet approved or cleared by the [place] FDA and  has been authorized for detection and/or diagnosis of SARS-CoV-2 by FDA under an Emergency Use Authorization (EUA). This EUA will remain  in effect (meaning this test can be used) for the duration of the COVID-19 declaration under Se ction 564(b)(1) of the Act, 21 U.S.C. section 360bbb-3(b)(1), unless the authorization is terminated or revoked sooner.  Performed at [facility], [place] [address] [place], [place] [address]      Labs: Basic Metabolic Panel: Recent Labs  Lab 09/03/20 2221 09/05/20 0254 09/06/20 0247 09/07/20 0621 09/08/20 0422  NA 135 134* 132* 134* 134*  K 4.2 4.0 3.8 4.1 4.0  CL 101 92* 94* 96* 96*  CO2 23 31 28 31 28   GLUCOSE 180* 122* 138* 116* 110*  BUN 12 13 17 22 22   CREATININE 0.91 1.05* 0.98 1.11* 1.06*  CALCIUM 8.5* 8.7* 8.7* 8.6* 8.9   Liver Function Tests: No results for input(s): AST, ALT, ALKPHOS, BILITOT, PROT, ALBUMIN in the last 168 hours. No results for input(s): LIPASE, AMYLASE in the last 168 hours. No results for input(s): AMMONIA in the last 168 hours. CBC: Recent Labs  Lab 09/03/20 2234 09/05/20 0254 09/07/20 0621  WBC 12.3* 10.0 10.5  HGB 14.2 13.5 13.7  HCT 44.8 40.7 42.9  MCV 101.1* 97.1 98.4  PLT 196 166 154   Cardiac Enzymes: No results for input(s): CKTOTAL, CKMB, CKMBINDEX, TROPONINI in the last 168 hours. BNP: BNP (last 3 results) Recent Labs     09/04/20 0907  BNP 540.9*    ProBNP (last 3 results) No results for input(s): PROBNP in the last 8760 hours.  CBG: Recent Labs  Lab 09/06/20 1139  GLUCAP 116*       Signed:  Oswald Hillock MD.  [facility] 09/08/2020, 10:48 AM

## 2020-09-08 NOTE — TOC Transition Note (Signed)
Transition of Care [facility]) - CM/SW Discharge Note   Patient Details  Name: ANJELI CASAD MRN: [id number] Date of Birth: 1945/12/19  Transition of Care [facility] CM/SW Contact:  Emeterio Reeve, [place] Phone Number: 09/08/2020, 11:11 AM   Clinical Narrative:     Patient will DC to: [facility] Anticipated DC date: 09/08/20 Family notified: Daughter Advertising copywriter by: Corey Harold     Per MD patient ready for DC to [facility]. RN, patient, patient's family, and facility notified of DC. Discharge Summary and FL2 sent to facility. DC packet on chart. Ambulance transport requested for patient.    RN to call report to [phone].  CSW will sign off for now as social work intervention is no longer needed. Please consult [place] again if new needs arise.   Final next level of care: Skilled Nursing Facility Barriers to Discharge: Barriers Resolved   Patient Goals and CMS Choice Patient states their goals for this hospitalization and ongoing recovery are:: "driving, doing what I want to do" CMS Medicare.gov Compare Post Acute Care list provided to:: Patient Choice offered to / list presented to : Patient  Discharge Placement              Patient chooses bed at: [facility] Patient to be transferred to facility by: [facility] Name of family member notified: Daughter, stephanie Patient and family notified of of transfer: 09/08/20  Discharge Plan and Services In-house Referral: Clinical Social Work   Post Acute Care Choice: [place]                               Social Determinants of Health (SDOH) Interventions     Readmission Risk Interventions No flowsheet data found.   Emeterio Reeve, Latanya Presser, [place] Social Worker [phone]

## 2020-09-08 NOTE — Progress Notes (Signed)
PT Cancellation Note  Patient Details Name: [name] MRN: [id number] DOB: 1945-07-20   Cancelled Treatment:    Reason Eval/Treat Not Completed: Patient preparing for d/c to SNF; will defer treatment.  [name], PT, DPT Acute Rehabilitation Services  Pager [phone] Office [place] 09/08/2020, 1:11 PM

## 2020-09-08 NOTE — Care Management Important Message (Signed)
Important Message  Patient Details  Name: SANDARA TYREE MRN: [id number] Date of Birth: 23-Sep-1945   Medicare Important Message Given:  Yes     Shelda Altes 09/08/2020, 11:33 AM

## 2020-09-08 NOTE — Progress Notes (Signed)
ANTICOAGULATION CONSULT NOTE - [name] for Warfarin Indication: AF, History of PE, popliteal artery embolus  Allergies  Allergen Reactions   Heparin Hives    Patient attests severe allergy- rash all over, severe itching, unable to take heparin   Other Itching and Rash    "Sheets and Linens used by [name]"   Patient Measurements: Height: 5\' 6"  (167.6 cm) Weight: 109.9 kg (242 lb 4.6 oz) IBW/kg (Calculated) : 59.3 Vital Signs: Temp: 98.6 F (37 C) (06/14 0810) Temp Source: Axillary (06/14 0810) BP: 140/80 (06/14 0810) Pulse Rate: 81 (06/14 0810)  Labs: Recent Labs    09/06/20 0247 09/07/20 0621 09/08/20 0422  HGB  --  13.7  --   HCT  --  42.9  --   PLT  --  154  --   LABPROT 22.5* 26.0* 30.5*  INR 2.0* 2.4* 2.9*  CREATININE 0.98 1.11* 1.06*    Estimated Creatinine Clearance: 58.4 mL/min (A) (by C-G formula based on SCr of 1.06 mg/dL (H)).  Assessment: 75 yo female taking warfarin (Indication: AF, History of PE, popliteal artery embolus). INR Goal 2.5-3.5 per last anticoag visit May '22. INR on admit (6/9) was subtherapeutic at 1.8.  INR at goal this morning at 2.9. CBC stable/WNL.  Home dose: 6 mg (3 mg x 2) every Wed; 4.5 mg (3 mg x 1.5) all other days.  Warfarin this admission: 6/9: INR 1.8 6/10: INR 2; 6mg  6/11: INR 2; 6 mg 6/12: INR 2; 6 mg 6/13: INR 2.4 - Near goal 6/14: INR 2.9  Goal of Therapy:  INR 2.5-3.5 Monitor CBC and INR   Plan:  Warfarin 4.5mg  tonight  Can resume PTA regimen on discharge to SNF Daily PT/INR while inpatient  [place], Pharm.D., BCPS Clinical Pharmacist Clinical phone for 09/08/2020 from 7:30-3:00 is [phone].  **Pharmacist phone directory can be found on [email or web address] listed under [place].  09/08/2020 9:23 AM

## 2020-09-14 ENCOUNTER — Encounter: Payer: Self-pay | Admitting: Orthopedic Surgery

## 2020-09-14 ENCOUNTER — Ambulatory Visit (INDEPENDENT_AMBULATORY_CARE_PROVIDER_SITE_OTHER): Payer: Medicare Other | Admitting: Orthopedic Surgery

## 2020-09-14 ENCOUNTER — Ambulatory Visit: Payer: Self-pay

## 2020-09-14 DIAGNOSIS — M7989 Other specified soft tissue disorders: Secondary | ICD-10-CM

## 2020-09-14 DIAGNOSIS — I743 Embolism and thrombosis of arteries of the lower extremities: Secondary | ICD-10-CM | POA: Diagnosis not present

## 2020-09-14 DIAGNOSIS — D1724 Benign lipomatous neoplasm of skin and subcutaneous tissue of left leg: Secondary | ICD-10-CM

## 2020-09-14 NOTE — Progress Notes (Addendum)
Office Visit Note   Patient: [name]           Date of Birth: [birth year]           MRN: [id number] Visit Date: 09/14/2020              Requested by: Josetta Huddle, MD [address] [place],  [place] [address] PCP: Josetta Huddle, MD  Chief Complaint  Patient presents with   Left Leg - Lipoma      HPI: Patient is a 75 year old woman who is seen for initial evaluation for a large soft tissue mass of her left thigh.  Patient states this mass has been growing over the past year and a half denies any pain but states that it is growing rapidly.  Patient is a resident at [facility]: Visit Diagnoses:  1. Lipoma of left lower extremity   2. Mass of soft tissue of left lower extremity     Plan: Will order an MRI scan to further evaluate the soft tissue mass.  The mass does not have a typical appearance for a lipoma and I am concerned for possible malignant soft tissue mass.  Follow-Up Instructions: No follow-ups on file.   Ortho Exam  Patient is alert, oriented, no adenopathy, well-dressed, normal affect, normal respiratory effort. Examination the left thigh has a large soft tissue mass that is mobile.  The surface of the mass has indurated skin with increased venous vascularity with varicose veins superficially.  The left lower extremity is neurovascular intact the soft tissue mass is anterior lateral just distal to the greater trochanter.  There is no pain to palpation.  Imaging: XR FEMUR MIN 2 VIEWS LEFT  Result Date: 09/14/2020 2 view radiographs of the left thigh shows previous vascular clips in place from a revascularization procedure.  There are no bony abnormalities no lytic femur lesions.  Patient does have a well-circumscribed soft tissue mass which is 18 x 11 cm.  There are no calcifications within the mass it is denser than surrounding tissue.  No images are attached to the encounter.  Labs: Lab Results   Component Value Date   HGBA1C 6.0 (H) 09/04/2020   HGBA1C (H) 06/04/2010    6.3 (NOTE)                                                                       According to the ADA Clinical Practice Recommendations for 2011, when HbA1c is used as a screening test:   >=6.5%   Diagnostic of Diabetes Mellitus           (if abnormal result  is confirmed)  5.7-6.4%   Increased risk of developing Diabetes Mellitus  References:Diagnosis and Classification of Diabetes Mellitus,Diabetes IOEV,0350,09(FGHWE 1):S62-S69 and Standards of Medical Care in         Diabetes - 2011,Diabetes XHBZ,1696,78  (Suppl 1):S11-S61.   HGBA1C (H) 07/24/2008    6.3 (NOTE) The ADA recommends the following therapeutic goal for glycemic control related to Hgb A1c measurement: Goal of therapy: <6.5 Hgb A1c  Reference: American Diabetes Association: Clinical Practice Recommendations 2010, Diabetes Care, 2010, 33: (Suppl  1).   ESRSEDRATE 49 (H) 03/23/2017   CRP  7.1 (H) 03/23/2017   REPTSTATUS 03/30/2017 FINAL 03/25/2017   GRAMSTAIN  03/05/2017    MODERATE WBC PRESENT, PREDOMINANTLY PMN RARE GRAM POSITIVE COCCI IN PAIRS    CULT NO GROWTH 5 DAYS 03/25/2017   LABORGA STREPTOCOCCUS GROUP G 03/23/2017     Lab Results  Component Value Date   ALBUMIN 3.0 (L) 03/23/2017   ALBUMIN 2.4 (L) 03/08/2017   ALBUMIN 2.9 (L) 03/04/2017    Lab Results  Component Value Date   MG 1.8 06/03/2010   No results found for: VD25OH  No results found for: PREALBUMIN CBC EXTENDED Latest Ref Rng & Units 09/07/2020 09/05/2020 09/03/2020  WBC 4.0 - 10.5 K/uL 10.5 10.0 12.3(H)  RBC 3.87 - 5.11 MIL/uL 4.36 4.19 4.43  HGB 12.0 - 15.0 g/dL 13.7 13.5 14.2  HCT 36.0 - 46.0 % 42.9 40.7 44.8  PLT 150 - 400 K/uL 154 166 196  NEUTROABS 1.7 - 7.7 K/uL - - -  LYMPHSABS 0.7 - 4.0 K/uL - - -     There is no height or weight on file to calculate BMI.  Orders:  Orders Placed This Encounter  Procedures   XR FEMUR MIN 2 VIEWS LEFT   No orders of the  defined types were placed in this encounter.    Procedures: No procedures performed  Clinical Data: No additional findings.  ROS:  All other systems negative, except as noted in the HPI. Review of Systems  Objective: Vital Signs: There were no vitals taken for this visit.  Specialty Comments:  No specialty comments available.  PMFS History: Patient Active Problem List   Diagnosis Date Noted   Coccyx pain    CHF (congestive heart failure) ([facility]) 09/04/2020   Acute respiratory failure with hypoxia ([place]) 09/04/2020   Fall at home, initial encounter 09/04/2020   Mitral valve stenosis    Essential hypertension 06/27/2017   History of lupus anticoagulant disorder 03/31/2017   NICM (nonischemic cardiomyopathy) ([place]) 03/31/2017   Streptococcal bacteremia    History of MRSA infection    Wound infection 03/24/2017   Cellulitis of right lower extremity 03/24/2017   Sepsis due to cellulitis ([place]) 03/04/2017   Popliteal artery embolus ([facility]) 02/16/2017   Encounter for therapeutic drug monitoring 06/11/2013   History of CVA (cerebrovascular accident) 11/18/2010   Long term (current) use of anticoagulants 08/09/2010   Obesity, unspecified 08/14/2008   Permanent atrial fibrillation ([facility]) 03/07/2008   History of pulmonary embolism 08/30/2005   Past Medical History:  Diagnosis Date   (HFpEF) heart failure with preserved ejection fraction ([facility])    a. 06/2008 Echo: EF 55-60%, mild LVH, triv AI, mild to mod MS, mod to sev dil LA, mildly dil RA.   Acute right MCA stroke ([place])    a. 05/04/9483 Embolic stroke treated with TPA and thrombectomy with hemorrhagic transformation; Carotids 3/12 negative for ICA stenosis.   Bilateral Pulmonary Emboli    a. 08/2005 - s/p IVC filter.   Depression    Embolus of femoral artery ([facility])    a. 06/2008 s/p bilateral embolectomies.   History of DVT (deep vein thrombosis)    a. s/p IVC filter.   HIT (heparin-induced thrombocytopenia) (H[facility])    HTN  (hypertension)    Lupus anticoagulant disorder ([facility])    Obesity, unspecified    Permanent atrial fibrillation ([place])    a. On chronic Coumadin - INRs followed by PCP; b. CHA2DS2VASc = 5.   Popliteal artery embolism, right ([place])    a. 01/2017 in the setting of  subRx INR-->s/p embolectomy.    Family History  Problem Relation Age of Onset   Coronary artery disease Other    Diabetes Other     Past Surgical History:  Procedure Laterality Date   distal radius fracture. (12,/16/2010)     EMBOLECTOMY Right 02/15/2017   Procedure: EMBOLECTOMY RIGHT LEG;  Surgeon: Elam Dutch, MD;  Location: [facility] OR;  Service: Vascular;  Laterality: Right;   I & D EXTREMITY Right 03/05/2017   Procedure: IRRIGATION AND DEBRIDEMENT RIGHT LEG HEMATOMA EVACUATION;  Surgeon: Elam Dutch, MD;  Location: [facility] OR;  Service: Vascular;  Laterality: Right;   IVC Placement 08/30/2005     Open reduction and internal fixation of left intra-articular     Social History   Occupational History   Not on file  Tobacco Use   Smoking status: Never   Smokeless tobacco: Never  Vaping Use   Vaping Use: Never used  Substance and Sexual Activity   Alcohol use: No   Drug use: No   Sexual activity: Not Currently

## 2020-09-22 ENCOUNTER — Other Ambulatory Visit: Payer: Self-pay | Admitting: Internal Medicine

## 2020-09-22 DIAGNOSIS — R52 Pain, unspecified: Secondary | ICD-10-CM

## 2020-09-23 NOTE — Progress Notes (Deleted)
Cardiology Office Note   Date:  09/23/2020   ID:  [name], DOB [birth year], MRN [id number]  PCP:  Josetta Huddle, MD  Cardiologist:   Minus Breeding, MD Referring:  ***  No chief complaint on file.     History of Present Illness: [name] is a 75 y.o. female who presents for management of atrial fibrillation, with other history to include DVTs and bilateral PEs, with an IVC filter.  She has also had bilateral femoral artery embolectomy on 06/2008, history of embolic CVA in 4627 requiring TPA with subsequent hemorrhage conversion.  She also was found to have a right lower extremity popliteal embolism in the setting of subtherapeutic INR of 1.43 requiring surgical intervention.Post surgery she developed a wound infection requiring surgery again on 03/05/2017 and a wound VAC was placed.   The patient also has a history of lupus anticoagulation disorder, obesity, nonischemic cardiomyopathy with EF of 40% to 45%.  The patient was taking anticoagulation with a CHADS VASC Score of 5 , on coumadin,.  Since she was seen by [place] she was in the hospital for acute respiratory failure.  I reviewed these records for this visit.   This was thought to be related to acute systolic HF.  ***  Sh*** e was last seen in person in the office on 04/15/2019 presents blood pressure after medication changes which were completed at the prior office visit in which I increased her losartan to 100 mg daily from 50 mg daily due to hypertension.  At that time her blood pressure had improved but not optimal.  The patient stated that her blood pressures generally much lower at home.  I will consider adding spironolactone to her medication regimen for better blood pressure control if necessary.  She was reinforced on low-sodium diet.    Past Medical History:  Diagnosis Date   (HFpEF) heart failure with preserved ejection fraction ([facility])    a. 06/2008 Echo: EF 55-60%, mild LVH, triv AI, mild to mod MS, mod to sev dil  LA, mildly dil RA.   Acute right MCA stroke ([place])    a. 0/05/5007 Embolic stroke treated with TPA and thrombectomy with hemorrhagic transformation; Carotids 3/12 negative for ICA stenosis.   Bilateral Pulmonary Emboli    a. 08/2005 - s/p IVC filter.   Depression    Embolus of femoral artery ([place])    a. 06/2008 s/p bilateral embolectomies.   History of DVT (deep vein thrombosis)    a. s/p IVC filter.   HIT (heparin-induced thrombocytopenia) (HCC)    HTN (hypertension)    Lupus anticoagulant disorder (HCC)    Obesity, unspecified    Permanent atrial fibrillation ([place])    a. On chronic Coumadin - INRs followed by PCP; b. CHA2DS2VASc = 5.   Popliteal artery embolism, right ([place])    a. 01/2017 in the setting of subRx INR-->s/p embolectomy.    Past Surgical History:  Procedure Laterality Date   distal radius fracture. (12,/16/2010)     EMBOLECTOMY Right 02/15/2017   Procedure: EMBOLECTOMY RIGHT LEG;  Surgeon: Elam Dutch, MD;  Location: [facility];  Service: Vascular;  Laterality: Right;   I & D EXTREMITY Right 03/05/2017   Procedure: IRRIGATION AND DEBRIDEMENT RIGHT LEG HEMATOMA EVACUATION;  Surgeon: Elam Dutch, MD;  Location: [facility] OR;  Service: Vascular;  Laterality: Right;   IVC Placement 08/30/2005     Open reduction and internal fixation of left intra-articular       Current Outpatient  Medications  Medication Sig Dispense Refill   acetaminophen (TYLENOL) 500 MG tablet Take 1,000 mg by mouth every 6 (six) hours as needed for mild pain.     Ascorbic Acid (VITA-C PO) Take 500 mg by mouth daily.     aspirin 81 MG tablet Take 81 mg by mouth daily.       cetirizine (ZYRTEC) 10 MG tablet Take 10 mg by mouth daily as needed for allergies.     Cholecalciferol (VITAMIN D3) 125 MCG (5000 UT) CAPS Take 5,000 Units by mouth daily.     FLUoxetine (PROZAC) 20 MG capsule Take 20 mg by mouth daily.     furosemide (LASIX) 40 MG tablet Take 1 tablet (40 mg total) by mouth daily. 90 tablet 2    lidocaine (LIDODERM) 5 % Place 1 patch onto the skin daily. Remove & Discard patch within 12 hours or as directed by MD 15 patch 0   losartan (COZAAR) 100 MG tablet Take 1 tablet by mouth once daily 90 tablet 0   methocarbamol (ROBAXIN) 500 MG tablet Take 1 tablet (500 mg total) by mouth every 6 (six) hours as needed for muscle spasms.     metoprolol succinate (TOPROL-XL) 50 MG 24 hr tablet Take 1 tablet (50 mg total) by mouth daily. Take with or immediately following a meal.     potassium chloride (K-DUR) 10 MEQ tablet TAKE 1 TABLET BY MOUTH ONCE DAILY 90 tablet 1   traMADol (ULTRAM) 50 MG tablet Take 1 tablet (50 mg total) by mouth every 6 (six) hours as needed for moderate pain. 10 tablet 0   warfarin (COUMADIN) 3 MG tablet TAKE 1 & 1/2 TO 2 (ONE & ONE-HALF TO TWO) TABLETS BY MOUTH ONCE DAILY AS DIRECTED BY  COUMADIN  CLINIC 180 tablet 0   No current facility-administered medications for this visit.    Allergies:   Heparin and Other    ROS:  Please see the history of present illness.   Otherwise, review of systems are positive for {NONE DEFAULTED:18576}.   All other systems are reviewed and negative.    PHYSICAL EXAM: VS:  There were no vitals taken for this visit. , BMI There is no height or weight on file to calculate BMI. GEN:  No distress NECK:  No jugular venous distention at 90 degrees, waveform within normal limits, carotid upstroke brisk and symmetric, no bruits, no thyromegaly LYMPHATICS:  No cervical adenopathy LUNGS:  Clear to auscultation bilaterally BACK:  No CVA tenderness CHEST:  Unremarkable HEART:  S1 and S2 within normal limits, no S3, no clicks, no rubs, *** murmurs, irregular  ABD:  Positive bowel sounds normal in frequency in pitch, no bruits, no rebound, no guarding, unable to assess midline mass or bruit with the patient seated. EXT:  2 plus pulses throughout, moderate edema, no cyanosis no clubbing SKIN:  No rashes no nodules NEURO:  Cranial nerves II through  XII grossly intact, motor grossly intact throughout PSYCH:  Cognitively intact, oriented to person place and time    ***GENERAL:  Well appearing HEENT:  Pupils equal round and reactive, fundi not visualized, oral mucosa unremarkable NECK:  No jugular venous distention, waveform within normal limits, carotid upstroke brisk and symmetric, no bruits, no thyromegaly LYMPHATICS:  No cervical, inguinal adenopathy LUNGS:  Clear to auscultation bilaterally BACK:  No CVA tenderness CHEST:  Unremarkable HEART:  PMI not displaced or sustained,S1 and S2 within normal limits, no S3, no S4, no clicks, no rubs, *** murmurs ABD:  Flat, positive bowel sounds normal in frequency in pitch, no bruits, no rebound, no guarding, no midline pulsatile mass, no hepatomegaly, no splenomegaly EXT:  2 plus pulses throughout, no edema, no cyanosis no clubbing SKIN:  No rashes no nodules NEURO:  Cranial nerves II through XII grossly intact, motor grossly intact throughout PSYCH:  Cognitively intact, oriented to person place and time    EKG:  EKG {ACTION; IS/IS ZOX:[id number]} ordered today. The ekg ordered today demonstrates ***   Recent Labs: 09/04/2020: B Natriuretic Peptide 540.9; TSH 3.381 09/07/2020: Hemoglobin 13.7; Platelets 154 09/08/2020: BUN 22; Creatinine, Ser 1.06; Potassium 4.0; Sodium 134    Lipid Panel    Component Value Date/Time   CHOL  06/04/2010 0359    139        ATP III CLASSIFICATION:  <200     mg/dL   Desirable  200-239  mg/dL   Borderline High  >=240    mg/dL   High          TRIG 71 06/04/2010 0359   HDL 33 (L) 06/04/2010 0359   CHOLHDL 4.2 06/04/2010 0359   VLDL 14 06/04/2010 0359   LDLCALC  06/04/2010 0359    92        Total Cholesterol/HDL:CHD Risk Coronary Heart Disease Risk Table                     Men   Women  1/2 Average Risk   3.4   3.3  Average Risk       5.0   4.4  2 X Average Risk   9.6   7.1  3 X Average Risk  23.4   11.0        Use the calculated Patient  Ratio above and the CHD Risk Table to determine the patient's CHD Risk.        ATP III CLASSIFICATION (LDL):  <100     mg/dL   Optimal  100-129  mg/dL   Near or Above                    Optimal  130-159  mg/dL   Borderline  160-189  mg/dL   High  >190     mg/dL   Very High      Wt Readings from Last 3 Encounters:  09/08/20 242 lb 4.6 oz (109.9 kg)  09/19/19 220 lb (99.8 kg)  04/15/19 272 lb 3.2 oz (123.5 kg)      Other studies Reviewed: Additional studies/ records that were reviewed today include: ***. Review of the above records demonstrates:  Please see elsewhere in the note.  ***   ASSESSMENT AND PLAN:   Acute on chronic systolic heart failure:   ***   - EF now improved to 55% - BNP 541 - CXR with vascular congestion - has received 40 mg lasix IV x 1 dose, scheduled to receive one more dose of IV lasix - she take 40 mg daily at home - continue BB and losartan - suspect she will not need further IV diuresis tomorrow   Permanent Afib, RVR:  ***   - maintained on lopressor 25 mg BID - mildly tachycardic in the 100-110s overnight, likely related to pain, resolved with IV lopressor - HR today in the 60-80s  Moderate to severe mitral stenosis/ Mild MR:  ***   - this is a progression from 2018 study which showed mild annular calcification - will need to monitor symptoms - consider TEE  Hypertension:  ***    - hypertensive in the ER 183/153, likely due to pain - pressure now controlled on home medication and IV lasix - do not anticipate any changes to her home regimen      Current medicines are reviewed at length with the patient today.  The patient {ACTIONS; HAS/DOES NOT HAVE:19233} concerns regarding medicines.  The following changes have been made:  {PLAN; NO CHANGE:13088:s}  Labs/ tests ordered today include: *** No orders of the defined types were placed in this encounter.    Disposition:   FU with ***    Signed, Minus Breeding, MD  09/23/2020 8:20  PM    [facility]

## 2020-09-24 ENCOUNTER — Ambulatory Visit: Payer: Medicare Other | Admitting: Cardiology

## 2020-09-28 DIAGNOSIS — I5023 Acute on chronic systolic (congestive) heart failure: Secondary | ICD-10-CM | POA: Insufficient documentation

## 2020-09-28 NOTE — Progress Notes (Signed)
Cardiology Office Note   Date:  09/29/2020   ID:  [name], DOB [birth year], MRN [id number]  PCP:  Josetta Huddle, MD  Cardiologist:   Minus Breeding, MD   Chief Complaint  Patient presents with   Edema       History of Present Illness: [name] is a 75 y.o. female who presents for management of atrial fibrillation, with other history to include DVTs and bilateral PEs, with an IVC filter.  She has also had bilateral femoral artery embolectomy on 06/2008, history of embolic CVA in 1017 requiring TPA with subsequent hemorrhage conversion.  She also was found to have a right lower extremity popliteal embolism in the setting of subtherapeutic INR of 1.43 requiring surgical intervention.Post surgery she developed a wound infection requiring surgery again on 03/05/2017 and a wound VAC was placed.   The patient also has a history of lupus anticoagulation disorder, obesity, nonischemic cardiomyopathy with EF of 40% to 45%.  The patient was taking anticoagulation with a CHADS VASC Score of 6 , on coumadin,.  Since she was seen by [place] she was in the hospital for acute respiratory failure.  I reviewed these records for this visit.   This was thought to be related to acute systolic HF.  This was in May.   Her EF was 55% on echo.  She was treated with IV Lasix.  Her metoprolol was increased at that discharge.  On reviewing the records I do see that she was thought to have severe mitral stenosis on her echo which would be new compared to 2018.  She went to rehab after this hospitalization she thinks she is doing relatively well.  In retrospect she was volume overloaded and her daughter recognize this.  She was more short of breath than she recalls at the moment.  She is breathing much better now.  She is working with physical therapy and walking some stairs.  She is doing an obstacle course.  She is always slept with her head up because of back pain but she is not having any new PND or orthopnea.   She is not having any new palpitations, presyncope or syncope.  She denies any chest pain.  She is now watching her salt better.   Past Medical History:  Diagnosis Date   (HFpEF) heart failure with preserved ejection fraction ([place])    a. 06/2008 Echo: EF 55-60%, mild LVH, triv AI, mild to mod MS, mod to sev dil LA, mildly dil RA.   Acute right MCA stroke ([place])    a. 07/26/256 Embolic stroke treated with TPA and thrombectomy with hemorrhagic transformation; Carotids 3/12 negative for ICA stenosis.   Bilateral Pulmonary Emboli    a. 08/2005 - s/p IVC filter.   Depression    Embolus of femoral artery ([place])    a. 06/2008 s/p bilateral embolectomies.   History of DVT (deep vein thrombosis)    a. s/p IVC filter.   HIT (heparin-induced thrombocytopenia) (HCC)    HTN (hypertension)    Lupus anticoagulant disorder (HCC)    Obesity, unspecified    Permanent atrial fibrillation ([place])    a. On chronic Coumadin - INRs followed by PCP; b. CHA2DS2VASc = 5.   Popliteal artery embolism, right ([place])    a. 01/2017 in the setting of subRx INR-->s/p embolectomy.    Past Surgical History:  Procedure Laterality Date   distal radius fracture. (12,/16/2010)     EMBOLECTOMY Right 02/15/2017   Procedure: EMBOLECTOMY RIGHT  LEG;  Surgeon: Elam Dutch, MD;  Location: [place];  Service: Vascular;  Laterality: Right;   I & D EXTREMITY Right 03/05/2017   Procedure: IRRIGATION AND DEBRIDEMENT RIGHT LEG HEMATOMA EVACUATION;  Surgeon: Elam Dutch, MD;  Location: [facility] OR;  Service: Vascular;  Laterality: Right;   IVC Placement 08/30/2005     Open reduction and internal fixation of left intra-articular       Current Outpatient Medications  Medication Sig Dispense Refill   acetaminophen (TYLENOL) 500 MG tablet Take 1,000 mg by mouth every 6 (six) hours as needed for mild pain.     Ascorbic Acid (VITA-C PO) Take 500 mg by mouth daily.     aspirin 81 MG tablet Take 81 mg by mouth daily.       cetirizine  (ZYRTEC) 10 MG tablet Take 10 mg by mouth daily as needed for allergies.     Cholecalciferol (VITAMIN D3) 125 MCG (5000 UT) CAPS Take 5,000 Units by mouth daily.     FLUoxetine (PROZAC) 20 MG capsule Take 20 mg by mouth daily.     furosemide (LASIX) 40 MG tablet Take 1 tablet (40 mg total) by mouth daily. 90 tablet 2   lidocaine (LIDODERM) 5 % Place 1 patch onto the skin daily. Remove & Discard patch within 12 hours or as directed by MD 15 patch 0   losartan (COZAAR) 100 MG tablet Take 1 tablet by mouth once daily 90 tablet 0   methocarbamol (ROBAXIN) 500 MG tablet Take 1 tablet (500 mg total) by mouth every 6 (six) hours as needed for muscle spasms.     metoprolol succinate (TOPROL-XL) 50 MG 24 hr tablet Take 1 tablet (50 mg total) by mouth daily. Take with or immediately following a meal.     potassium chloride (K-DUR) 10 MEQ tablet TAKE 1 TABLET BY MOUTH ONCE DAILY 90 tablet 1   traMADol (ULTRAM) 50 MG tablet Take 1 tablet (50 mg total) by mouth every 6 (six) hours as needed for moderate pain. 10 tablet 0   warfarin (COUMADIN) 3 MG tablet TAKE 1 & 1/2 TO 2 (ONE & ONE-HALF TO TWO) TABLETS BY MOUTH ONCE DAILY AS DIRECTED BY  COUMADIN  CLINIC (Patient taking differently: AS DIRECTED/dosed by [facility]) 180 tablet 0   No current facility-administered medications for this visit.    Allergies:   Heparin and Other    ROS:  Please see the history of present illness.   Otherwise, review of systems are positive for none.   All other systems are reviewed and negative.    PHYSICAL EXAM: VS:  BP 120/68 (BP Location: Left Arm, Patient Position: Sitting, Cuff Size: Large)   Pulse 84   Ht 5\' 6"  (1.676 m)   Wt 238 lb (108 kg)   BMI 38.41 kg/m  , BMI Body mass index is 38.41 kg/m. GEN:  No distress NECK:  No jugular venous distention at 90 degrees, waveform within normal limits, carotid upstroke brisk and symmetric, no bruits, no thyromegaly LYMPHATICS:  No cervical adenopathy LUNGS:  Clear to  auscultation bilaterally BACK:  No CVA tenderness CHEST:  Unremarkable HEART:  S1 and S2 within normal limits, no S3, no clicks, no rubs, no murmurs, irregular ABD:  Positive bowel sounds normal in frequency in pitch, no bruits, no rebound, no guarding, unable to assess midline mass or bruit with the patient seated. EXT:  2 plus pulses throughout, moderate edema, no cyanosis no clubbing, chronic venous stasis changes SKIN:  No  rashes no nodules NEURO:  Cranial nerves II through XII grossly intact, motor grossly intact throughout PSYCH:  Cognitively intact, oriented to person place and time   EKG:  EKG is ordered today. The ekg ordered today demonstrates atrial fibrillation, rate 84, axis within normal limits, low voltage limb leads, no acute ST-T wave changes.   Recent Labs: 09/04/2020: B Natriuretic Peptide 540.9; TSH 3.381 09/07/2020: Hemoglobin 13.7; Platelets 154 09/08/2020: BUN 22; Creatinine, Ser 1.06; Potassium 4.0; Sodium 134    Lipid Panel    Component Value Date/Time   CHOL  06/04/2010 0359    139        ATP III CLASSIFICATION:  <200     mg/dL   Desirable  200-239  mg/dL   Borderline High  >=240    mg/dL   High          TRIG 71 06/04/2010 0359   HDL 33 (L) 06/04/2010 0359   CHOLHDL 4.2 06/04/2010 0359   VLDL 14 06/04/2010 0359   LDLCALC  06/04/2010 0359    92        Total Cholesterol/HDL:CHD Risk Coronary Heart Disease Risk Table                     Men   Women  1/2 Average Risk   3.4   3.3  Average Risk       5.0   4.4  2 X Average Risk   9.6   7.1  3 X Average Risk  23.4   11.0        Use the calculated Patient Ratio above and the CHD Risk Table to determine the patient's CHD Risk.        ATP III CLASSIFICATION (LDL):  <100     mg/dL   Optimal  100-129  mg/dL   Near or Above                    Optimal  130-159  mg/dL   Borderline  160-189  mg/dL   High  >190     mg/dL   Very High      Wt Readings from Last 3 Encounters:  09/29/20 238 lb (108 kg)   09/08/20 242 lb 4.6 oz (109.9 kg)  09/19/19 220 lb (99.8 kg)      Other studies Reviewed: Additional studies/ records that were reviewed today include: Hospital records. Review of the above records demonstrates:  Please see elsewhere in the note.     ASSESSMENT AND PLAN:   Acute on chronic systolic heart failure:   Her EF was preserved at 55%..  She seems to be euvolemic and she is better watching her salt and we talked about weighing herself once she gets home.  She tolerates warfarin and has good rate control.  No change in therapy.    Moderate to severe mitral stenosis/ Mild MR: I will follow this up with an echo in 6 months.  I wonder if this was an inaccurate measurement.  Very likely I will follow this clinically and only would suggest a TEE if there is a marked difference between the exams.  Hypertension: Her blood pressure is well controlled.  She will continue the meds as listed.     Current medicines are reviewed at length with the patient today.  The patient does not have concerns regarding medicines.  The following changes have been made:  no change  Labs/ tests ordered today include:   Orders Placed This  Encounter  Procedures   EKG 12-Lead   ECHOCARDIOGRAM COMPLETE      Disposition:   FU with Jory Sims, DNP in six months.    Signed, Minus Breeding, MD  09/29/2020 3:06 PM    [facility]

## 2020-09-29 ENCOUNTER — Ambulatory Visit (INDEPENDENT_AMBULATORY_CARE_PROVIDER_SITE_OTHER): Payer: Medicare Other | Admitting: Cardiology

## 2020-09-29 ENCOUNTER — Encounter: Payer: Self-pay | Admitting: Cardiology

## 2020-09-29 ENCOUNTER — Other Ambulatory Visit: Payer: Self-pay

## 2020-09-29 VITALS — BP 120/68 | HR 84 | Ht 66.0 in | Wt 238.0 lb

## 2020-09-29 DIAGNOSIS — I5023 Acute on chronic systolic (congestive) heart failure: Secondary | ICD-10-CM | POA: Diagnosis not present

## 2020-09-29 DIAGNOSIS — I10 Essential (primary) hypertension: Secondary | ICD-10-CM

## 2020-09-29 DIAGNOSIS — I4821 Permanent atrial fibrillation: Secondary | ICD-10-CM

## 2020-09-29 DIAGNOSIS — I342 Nonrheumatic mitral (valve) stenosis: Secondary | ICD-10-CM

## 2020-09-29 DIAGNOSIS — I743 Embolism and thrombosis of arteries of the lower extremities: Secondary | ICD-10-CM

## 2020-09-29 NOTE — Patient Instructions (Signed)
Medication Instructions:  Your physician recommends that you continue on your current medications as directed. Please refer to the Current Medication list given to you today.  Testing/Procedures: Your physician has requested that you have an echocardiogram in 6 MONTHS. Echocardiography is a painless test that uses sound waves to create images of your heart. It provides your doctor with information about the size and shape of your heart and how well your heart's chambers and valves are working. This procedure takes approximately one hour. There are no restrictions for this procedure. This will be done at our [facility] location:  [place]: At [facility], you and your health needs are our priority.  As part of our continuing mission to provide you with exceptional heart care, we have created designated Provider Care Teams.  These Care Teams include your primary Cardiologist (physician) and Advanced Practice Providers (APPs -  Physician Assistants and Nurse Practitioners) who all work together to provide you with the care you need, when you need it.  We recommend signing up for the patient portal called "MyChart".  Sign up information is provided on this After Visit Summary.  MyChart is used to connect with patients for Virtual Visits (Telemedicine).  Patients are able to view lab/test results, encounter notes, upcoming appointments, etc.  Non-urgent messages can be sent to your provider as well.   To learn more about what you can do with MyChart, go to [email or web address].    Your next appointment:   6 month(s) (after echo completed)  The format for your next appointment:   In Person  Provider:   You will see one of the following Advanced Practice Providers on your designated Care Team:   Jory Sims, DNP, ANP

## 2020-10-03 DIAGNOSIS — I4891 Unspecified atrial fibrillation: Secondary | ICD-10-CM | POA: Diagnosis not present

## 2020-10-03 DIAGNOSIS — S300XXA Contusion of lower back and pelvis, initial encounter: Secondary | ICD-10-CM | POA: Diagnosis not present

## 2020-10-03 DIAGNOSIS — W19XXXA Unspecified fall, initial encounter: Secondary | ICD-10-CM | POA: Diagnosis not present

## 2020-10-03 DIAGNOSIS — I5020 Unspecified systolic (congestive) heart failure: Secondary | ICD-10-CM | POA: Diagnosis not present

## 2020-10-03 DIAGNOSIS — D6869 Other thrombophilia: Secondary | ICD-10-CM | POA: Diagnosis not present

## 2020-10-04 DIAGNOSIS — I110 Hypertensive heart disease with heart failure: Secondary | ICD-10-CM | POA: Diagnosis not present

## 2020-10-04 DIAGNOSIS — R229 Localized swelling, mass and lump, unspecified: Secondary | ICD-10-CM | POA: Diagnosis not present

## 2020-10-04 DIAGNOSIS — D6859 Other primary thrombophilia: Secondary | ICD-10-CM | POA: Diagnosis not present

## 2020-10-04 DIAGNOSIS — W19XXXD Unspecified fall, subsequent encounter: Secondary | ICD-10-CM | POA: Diagnosis not present

## 2020-10-04 DIAGNOSIS — Z8673 Personal history of transient ischemic attack (TIA), and cerebral infarction without residual deficits: Secondary | ICD-10-CM | POA: Diagnosis not present

## 2020-10-04 DIAGNOSIS — Z86718 Personal history of other venous thrombosis and embolism: Secondary | ICD-10-CM | POA: Diagnosis not present

## 2020-10-04 DIAGNOSIS — M5136 Other intervertebral disc degeneration, lumbar region: Secondary | ICD-10-CM | POA: Diagnosis not present

## 2020-10-04 DIAGNOSIS — I5023 Acute on chronic systolic (congestive) heart failure: Secondary | ICD-10-CM | POA: Diagnosis not present

## 2020-10-04 DIAGNOSIS — Z86711 Personal history of pulmonary embolism: Secondary | ICD-10-CM | POA: Diagnosis not present

## 2020-10-04 DIAGNOSIS — I050 Rheumatic mitral stenosis: Secondary | ICD-10-CM | POA: Diagnosis not present

## 2020-10-04 DIAGNOSIS — Z7901 Long term (current) use of anticoagulants: Secondary | ICD-10-CM | POA: Diagnosis not present

## 2020-10-04 DIAGNOSIS — Z5181 Encounter for therapeutic drug level monitoring: Secondary | ICD-10-CM | POA: Diagnosis not present

## 2020-10-04 DIAGNOSIS — I739 Peripheral vascular disease, unspecified: Secondary | ICD-10-CM | POA: Diagnosis not present

## 2020-10-04 DIAGNOSIS — Z79891 Long term (current) use of opiate analgesic: Secondary | ICD-10-CM | POA: Diagnosis not present

## 2020-10-04 DIAGNOSIS — Z9181 History of falling: Secondary | ICD-10-CM | POA: Diagnosis not present

## 2020-10-04 DIAGNOSIS — I4821 Permanent atrial fibrillation: Secondary | ICD-10-CM | POA: Diagnosis not present

## 2020-10-05 ENCOUNTER — Ambulatory Visit
Admission: RE | Admit: 2020-10-05 | Discharge: 2020-10-05 | Disposition: A | Discharge status: Home / Self Care | Payer: Medicare Other | Source: Ambulatory Visit | Attending: Internal Medicine | Admitting: Internal Medicine

## 2020-10-05 ENCOUNTER — Other Ambulatory Visit: Payer: Self-pay

## 2020-10-05 DIAGNOSIS — R52 Pain, unspecified: Secondary | ICD-10-CM

## 2020-10-05 DIAGNOSIS — I4821 Permanent atrial fibrillation: Secondary | ICD-10-CM | POA: Diagnosis not present

## 2020-10-05 DIAGNOSIS — I050 Rheumatic mitral stenosis: Secondary | ICD-10-CM | POA: Diagnosis not present

## 2020-10-05 DIAGNOSIS — I5023 Acute on chronic systolic (congestive) heart failure: Secondary | ICD-10-CM | POA: Diagnosis not present

## 2020-10-05 DIAGNOSIS — M160 Bilateral primary osteoarthritis of hip: Secondary | ICD-10-CM | POA: Diagnosis not present

## 2020-10-05 DIAGNOSIS — S3210XA Unspecified fracture of sacrum, initial encounter for closed fracture: Secondary | ICD-10-CM | POA: Diagnosis not present

## 2020-10-05 DIAGNOSIS — M533 Sacrococcygeal disorders, not elsewhere classified: Secondary | ICD-10-CM | POA: Diagnosis not present

## 2020-10-05 DIAGNOSIS — K5730 Diverticulosis of large intestine without perforation or abscess without bleeding: Secondary | ICD-10-CM | POA: Diagnosis not present

## 2020-10-05 DIAGNOSIS — M5136 Other intervertebral disc degeneration, lumbar region: Secondary | ICD-10-CM | POA: Diagnosis not present

## 2020-10-05 DIAGNOSIS — I739 Peripheral vascular disease, unspecified: Secondary | ICD-10-CM | POA: Diagnosis not present

## 2020-10-05 DIAGNOSIS — I110 Hypertensive heart disease with heart failure: Secondary | ICD-10-CM | POA: Diagnosis not present

## 2020-10-06 DIAGNOSIS — I110 Hypertensive heart disease with heart failure: Secondary | ICD-10-CM | POA: Diagnosis not present

## 2020-10-06 DIAGNOSIS — I050 Rheumatic mitral stenosis: Secondary | ICD-10-CM | POA: Diagnosis not present

## 2020-10-06 DIAGNOSIS — M5136 Other intervertebral disc degeneration, lumbar region: Secondary | ICD-10-CM | POA: Diagnosis not present

## 2020-10-06 DIAGNOSIS — I4821 Permanent atrial fibrillation: Secondary | ICD-10-CM | POA: Diagnosis not present

## 2020-10-06 DIAGNOSIS — I5023 Acute on chronic systolic (congestive) heart failure: Secondary | ICD-10-CM | POA: Diagnosis not present

## 2020-10-06 DIAGNOSIS — I739 Peripheral vascular disease, unspecified: Secondary | ICD-10-CM | POA: Diagnosis not present

## 2020-10-07 ENCOUNTER — Telehealth: Payer: Self-pay | Admitting: *Deleted

## 2020-10-07 ENCOUNTER — Other Ambulatory Visit: Payer: Self-pay | Admitting: *Deleted

## 2020-10-07 NOTE — Patient Outreach (Signed)
Member screened for potential [facility] Care Management needs.   Verified in [place] (Patient [name]) that member transitioned from [facility] on 10/03/20 to home.   Telephone call made to [name] [phone]. Patient identifiers confirmed. [name] states she is doing well since she has been home. Reports PCP appointment is scheduled for next week. States she has all of medications and denies having any needs. Reports [facility] has been coming out for therapy.   Will sign off. No identifiable [facility] Care Management needs at this time.   Marthenia Rolling, MSN, RN,BSN [place] Acute Care Coordinator [phone] [facility]) [phone]  (Toll free office)

## 2020-10-07 NOTE — Telephone Encounter (Addendum)
Pt is overdue to have INR check. Pt cancelled her appointment from yesterday to have INR checked. Called and spoke to pt who stated that she has [facility] coming out to the house and they are to check her INR on 7/15. Called and spoke to [facility] who stated that Dr. Inda Merlin gave order for INR check for 7/15 and they are to call results to him. Informed [facility] that was great but pt is followed by the coumadin clinic-heartcare and if Dr. Inda Merlin wants the coumadin clinic to continue to manage pt's INR/warfarin then to call INR results to Coumadin Clinic [phone]. [facility] stated that Sharyn Lull is to be going out to pt's home on Friday and her number is [phone].

## 2020-10-08 ENCOUNTER — Ambulatory Visit
Admission: RE | Admit: 2020-10-08 | Discharge: 2020-10-08 | Disposition: A | Discharge status: Home / Self Care | Payer: Medicare Other | Source: Ambulatory Visit | Attending: Orthopedic Surgery | Admitting: Orthopedic Surgery

## 2020-10-08 DIAGNOSIS — I739 Peripheral vascular disease, unspecified: Secondary | ICD-10-CM | POA: Diagnosis not present

## 2020-10-08 DIAGNOSIS — I050 Rheumatic mitral stenosis: Secondary | ICD-10-CM | POA: Diagnosis not present

## 2020-10-08 DIAGNOSIS — M1612 Unilateral primary osteoarthritis, left hip: Secondary | ICD-10-CM | POA: Diagnosis not present

## 2020-10-08 DIAGNOSIS — M7989 Other specified soft tissue disorders: Secondary | ICD-10-CM

## 2020-10-08 DIAGNOSIS — M5136 Other intervertebral disc degeneration, lumbar region: Secondary | ICD-10-CM | POA: Diagnosis not present

## 2020-10-08 DIAGNOSIS — I110 Hypertensive heart disease with heart failure: Secondary | ICD-10-CM | POA: Diagnosis not present

## 2020-10-08 DIAGNOSIS — I5023 Acute on chronic systolic (congestive) heart failure: Secondary | ICD-10-CM | POA: Diagnosis not present

## 2020-10-08 DIAGNOSIS — R2242 Localized swelling, mass and lump, left lower limb: Secondary | ICD-10-CM | POA: Diagnosis not present

## 2020-10-08 DIAGNOSIS — I4821 Permanent atrial fibrillation: Secondary | ICD-10-CM | POA: Diagnosis not present

## 2020-10-09 ENCOUNTER — Ambulatory Visit (INDEPENDENT_AMBULATORY_CARE_PROVIDER_SITE_OTHER): Payer: Medicare Other | Admitting: Pharmacist

## 2020-10-09 DIAGNOSIS — Z86711 Personal history of pulmonary embolism: Secondary | ICD-10-CM

## 2020-10-09 DIAGNOSIS — M5136 Other intervertebral disc degeneration, lumbar region: Secondary | ICD-10-CM | POA: Diagnosis not present

## 2020-10-09 DIAGNOSIS — I050 Rheumatic mitral stenosis: Secondary | ICD-10-CM | POA: Diagnosis not present

## 2020-10-09 DIAGNOSIS — I743 Embolism and thrombosis of arteries of the lower extremities: Secondary | ICD-10-CM | POA: Diagnosis not present

## 2020-10-09 DIAGNOSIS — I110 Hypertensive heart disease with heart failure: Secondary | ICD-10-CM | POA: Diagnosis not present

## 2020-10-09 DIAGNOSIS — I4821 Permanent atrial fibrillation: Secondary | ICD-10-CM

## 2020-10-09 DIAGNOSIS — Z5181 Encounter for therapeutic drug level monitoring: Secondary | ICD-10-CM | POA: Diagnosis not present

## 2020-10-09 DIAGNOSIS — I5023 Acute on chronic systolic (congestive) heart failure: Secondary | ICD-10-CM | POA: Diagnosis not present

## 2020-10-09 DIAGNOSIS — I739 Peripheral vascular disease, unspecified: Secondary | ICD-10-CM | POA: Diagnosis not present

## 2020-10-09 LAB — POCT INR: INR: 5.9 — AB (ref 2.0–3.0)

## 2020-10-09 NOTE — Patient Instructions (Signed)
Description   Spoke with Amy RN with [facility].  - Pt will skip warfarin for 3 days then resume old dose of Warfarin 1.5 tablets daily except for 2 tablets on Wednesdays.  - Recheck on 7/21  Coumadin Clinic # [phone]

## 2020-10-12 DIAGNOSIS — I110 Hypertensive heart disease with heart failure: Secondary | ICD-10-CM | POA: Diagnosis not present

## 2020-10-12 DIAGNOSIS — I739 Peripheral vascular disease, unspecified: Secondary | ICD-10-CM | POA: Diagnosis not present

## 2020-10-12 DIAGNOSIS — M5136 Other intervertebral disc degeneration, lumbar region: Secondary | ICD-10-CM | POA: Diagnosis not present

## 2020-10-12 DIAGNOSIS — I050 Rheumatic mitral stenosis: Secondary | ICD-10-CM | POA: Diagnosis not present

## 2020-10-12 DIAGNOSIS — I4821 Permanent atrial fibrillation: Secondary | ICD-10-CM | POA: Diagnosis not present

## 2020-10-12 DIAGNOSIS — I5023 Acute on chronic systolic (congestive) heart failure: Secondary | ICD-10-CM | POA: Diagnosis not present

## 2020-10-14 DIAGNOSIS — I4821 Permanent atrial fibrillation: Secondary | ICD-10-CM | POA: Diagnosis not present

## 2020-10-14 DIAGNOSIS — I050 Rheumatic mitral stenosis: Secondary | ICD-10-CM | POA: Diagnosis not present

## 2020-10-14 DIAGNOSIS — I110 Hypertensive heart disease with heart failure: Secondary | ICD-10-CM | POA: Diagnosis not present

## 2020-10-14 DIAGNOSIS — I5023 Acute on chronic systolic (congestive) heart failure: Secondary | ICD-10-CM | POA: Diagnosis not present

## 2020-10-14 DIAGNOSIS — I739 Peripheral vascular disease, unspecified: Secondary | ICD-10-CM | POA: Diagnosis not present

## 2020-10-14 DIAGNOSIS — M5136 Other intervertebral disc degeneration, lumbar region: Secondary | ICD-10-CM | POA: Diagnosis not present

## 2020-10-15 ENCOUNTER — Ambulatory Visit (INDEPENDENT_AMBULATORY_CARE_PROVIDER_SITE_OTHER): Payer: Medicare Other | Admitting: *Deleted

## 2020-10-15 DIAGNOSIS — I4821 Permanent atrial fibrillation: Secondary | ICD-10-CM

## 2020-10-15 DIAGNOSIS — I743 Embolism and thrombosis of arteries of the lower extremities: Secondary | ICD-10-CM

## 2020-10-15 DIAGNOSIS — Z5181 Encounter for therapeutic drug level monitoring: Secondary | ICD-10-CM

## 2020-10-15 DIAGNOSIS — I5023 Acute on chronic systolic (congestive) heart failure: Secondary | ICD-10-CM | POA: Diagnosis not present

## 2020-10-15 DIAGNOSIS — Z86711 Personal history of pulmonary embolism: Secondary | ICD-10-CM

## 2020-10-15 DIAGNOSIS — M5136 Other intervertebral disc degeneration, lumbar region: Secondary | ICD-10-CM | POA: Diagnosis not present

## 2020-10-15 DIAGNOSIS — I110 Hypertensive heart disease with heart failure: Secondary | ICD-10-CM | POA: Diagnosis not present

## 2020-10-15 DIAGNOSIS — I050 Rheumatic mitral stenosis: Secondary | ICD-10-CM | POA: Diagnosis not present

## 2020-10-15 DIAGNOSIS — I739 Peripheral vascular disease, unspecified: Secondary | ICD-10-CM | POA: Diagnosis not present

## 2020-10-15 LAB — POCT INR: INR: 3.8 — AB (ref 2.0–3.0)

## 2020-10-15 NOTE — Patient Instructions (Addendum)
Description   Spoke with Amy LPN with [facility] pt to hold today's dose then start taking Warfarin 1.5 tablets daily except for 1 tablet on Mondays. Recheck in 1 week. Coumadin Clinic # [phone]

## 2020-10-16 DIAGNOSIS — I739 Peripheral vascular disease, unspecified: Secondary | ICD-10-CM | POA: Diagnosis not present

## 2020-10-16 DIAGNOSIS — I110 Hypertensive heart disease with heart failure: Secondary | ICD-10-CM | POA: Diagnosis not present

## 2020-10-16 DIAGNOSIS — M5136 Other intervertebral disc degeneration, lumbar region: Secondary | ICD-10-CM | POA: Diagnosis not present

## 2020-10-16 DIAGNOSIS — I050 Rheumatic mitral stenosis: Secondary | ICD-10-CM | POA: Diagnosis not present

## 2020-10-16 DIAGNOSIS — I5023 Acute on chronic systolic (congestive) heart failure: Secondary | ICD-10-CM | POA: Diagnosis not present

## 2020-10-16 DIAGNOSIS — I4821 Permanent atrial fibrillation: Secondary | ICD-10-CM | POA: Diagnosis not present

## 2020-10-19 DIAGNOSIS — I739 Peripheral vascular disease, unspecified: Secondary | ICD-10-CM | POA: Diagnosis not present

## 2020-10-19 DIAGNOSIS — I5023 Acute on chronic systolic (congestive) heart failure: Secondary | ICD-10-CM | POA: Diagnosis not present

## 2020-10-19 DIAGNOSIS — I4821 Permanent atrial fibrillation: Secondary | ICD-10-CM | POA: Diagnosis not present

## 2020-10-19 DIAGNOSIS — I110 Hypertensive heart disease with heart failure: Secondary | ICD-10-CM | POA: Diagnosis not present

## 2020-10-19 DIAGNOSIS — I050 Rheumatic mitral stenosis: Secondary | ICD-10-CM | POA: Diagnosis not present

## 2020-10-19 DIAGNOSIS — M5136 Other intervertebral disc degeneration, lumbar region: Secondary | ICD-10-CM | POA: Diagnosis not present

## 2020-10-20 ENCOUNTER — Other Ambulatory Visit: Payer: Self-pay | Admitting: Cardiology

## 2020-10-20 ENCOUNTER — Other Ambulatory Visit: Payer: Self-pay | Admitting: Adult Health

## 2020-10-21 ENCOUNTER — Ambulatory Visit (INDEPENDENT_AMBULATORY_CARE_PROVIDER_SITE_OTHER): Payer: Medicare Other | Admitting: *Deleted

## 2020-10-21 DIAGNOSIS — I4821 Permanent atrial fibrillation: Secondary | ICD-10-CM | POA: Diagnosis not present

## 2020-10-21 DIAGNOSIS — I739 Peripheral vascular disease, unspecified: Secondary | ICD-10-CM | POA: Diagnosis not present

## 2020-10-21 DIAGNOSIS — I110 Hypertensive heart disease with heart failure: Secondary | ICD-10-CM | POA: Diagnosis not present

## 2020-10-21 DIAGNOSIS — Z5181 Encounter for therapeutic drug level monitoring: Secondary | ICD-10-CM | POA: Diagnosis not present

## 2020-10-21 DIAGNOSIS — Z86711 Personal history of pulmonary embolism: Secondary | ICD-10-CM | POA: Diagnosis not present

## 2020-10-21 DIAGNOSIS — I743 Embolism and thrombosis of arteries of the lower extremities: Secondary | ICD-10-CM | POA: Diagnosis not present

## 2020-10-21 DIAGNOSIS — M5136 Other intervertebral disc degeneration, lumbar region: Secondary | ICD-10-CM | POA: Diagnosis not present

## 2020-10-21 DIAGNOSIS — I050 Rheumatic mitral stenosis: Secondary | ICD-10-CM | POA: Diagnosis not present

## 2020-10-21 DIAGNOSIS — I5023 Acute on chronic systolic (congestive) heart failure: Secondary | ICD-10-CM | POA: Diagnosis not present

## 2020-10-21 LAB — POCT INR: INR: 3.1 — AB (ref 2.0–3.0)

## 2020-10-21 NOTE — Patient Instructions (Signed)
Description   Spoke with Sharyn Lull RN with [facility] pt to continue taking Warfarin 1.5 tablets daily except for 1 tablet on Mondays. Recheck in 1 week. Coumadin Clinic # [phone]

## 2020-10-22 ENCOUNTER — Other Ambulatory Visit: Payer: Self-pay

## 2020-10-22 ENCOUNTER — Ambulatory Visit (INDEPENDENT_AMBULATORY_CARE_PROVIDER_SITE_OTHER): Payer: Medicare Other | Admitting: Orthopedic Surgery

## 2020-10-22 DIAGNOSIS — I110 Hypertensive heart disease with heart failure: Secondary | ICD-10-CM | POA: Diagnosis not present

## 2020-10-22 DIAGNOSIS — I743 Embolism and thrombosis of arteries of the lower extremities: Secondary | ICD-10-CM | POA: Diagnosis not present

## 2020-10-22 DIAGNOSIS — I4821 Permanent atrial fibrillation: Secondary | ICD-10-CM | POA: Diagnosis not present

## 2020-10-22 DIAGNOSIS — M7989 Other specified soft tissue disorders: Secondary | ICD-10-CM | POA: Diagnosis not present

## 2020-10-22 DIAGNOSIS — I739 Peripheral vascular disease, unspecified: Secondary | ICD-10-CM | POA: Diagnosis not present

## 2020-10-22 DIAGNOSIS — I5023 Acute on chronic systolic (congestive) heart failure: Secondary | ICD-10-CM | POA: Diagnosis not present

## 2020-10-22 DIAGNOSIS — I050 Rheumatic mitral stenosis: Secondary | ICD-10-CM | POA: Diagnosis not present

## 2020-10-22 DIAGNOSIS — M5136 Other intervertebral disc degeneration, lumbar region: Secondary | ICD-10-CM | POA: Diagnosis not present

## 2020-10-25 ENCOUNTER — Other Ambulatory Visit: Payer: Self-pay | Admitting: Cardiology

## 2020-10-25 ENCOUNTER — Other Ambulatory Visit: Payer: Self-pay | Admitting: Adult Health

## 2020-10-26 DIAGNOSIS — I050 Rheumatic mitral stenosis: Secondary | ICD-10-CM | POA: Diagnosis not present

## 2020-10-26 DIAGNOSIS — I4821 Permanent atrial fibrillation: Secondary | ICD-10-CM | POA: Diagnosis not present

## 2020-10-26 DIAGNOSIS — I5023 Acute on chronic systolic (congestive) heart failure: Secondary | ICD-10-CM | POA: Diagnosis not present

## 2020-10-26 DIAGNOSIS — I739 Peripheral vascular disease, unspecified: Secondary | ICD-10-CM | POA: Diagnosis not present

## 2020-10-26 DIAGNOSIS — I110 Hypertensive heart disease with heart failure: Secondary | ICD-10-CM | POA: Diagnosis not present

## 2020-10-26 DIAGNOSIS — M5136 Other intervertebral disc degeneration, lumbar region: Secondary | ICD-10-CM | POA: Diagnosis not present

## 2020-10-27 NOTE — Telephone Encounter (Signed)
Prescription refill request received for warfarin Lov: hochrein 09/29/2020 Next INR check: 8/3 Warfarin tablet strength: '3mg'$ 

## 2020-10-27 NOTE — Telephone Encounter (Signed)
Prescription refill request received for warfarin Lov: hochrein 09/29/20 Next INR check: 8/3 (tomorrow) Warfarin tablet strength:'3mg'$  Warfarin schedule:1.5 tablets daily except for 1 tablet on Mondays. Pt [facility] for [facility]. I will route to them to make sure that they agree and to make sure they do not wish to hold it until they receive the patients next inr.

## 2020-10-28 ENCOUNTER — Ambulatory Visit (INDEPENDENT_AMBULATORY_CARE_PROVIDER_SITE_OTHER): Payer: Medicare Other

## 2020-10-28 DIAGNOSIS — I4821 Permanent atrial fibrillation: Secondary | ICD-10-CM | POA: Diagnosis not present

## 2020-10-28 DIAGNOSIS — I743 Embolism and thrombosis of arteries of the lower extremities: Secondary | ICD-10-CM

## 2020-10-28 DIAGNOSIS — Z86711 Personal history of pulmonary embolism: Secondary | ICD-10-CM | POA: Diagnosis not present

## 2020-10-28 DIAGNOSIS — I050 Rheumatic mitral stenosis: Secondary | ICD-10-CM | POA: Diagnosis not present

## 2020-10-28 DIAGNOSIS — I739 Peripheral vascular disease, unspecified: Secondary | ICD-10-CM | POA: Diagnosis not present

## 2020-10-28 DIAGNOSIS — I5023 Acute on chronic systolic (congestive) heart failure: Secondary | ICD-10-CM | POA: Diagnosis not present

## 2020-10-28 DIAGNOSIS — Z5181 Encounter for therapeutic drug level monitoring: Secondary | ICD-10-CM

## 2020-10-28 DIAGNOSIS — M5136 Other intervertebral disc degeneration, lumbar region: Secondary | ICD-10-CM | POA: Diagnosis not present

## 2020-10-28 DIAGNOSIS — I110 Hypertensive heart disease with heart failure: Secondary | ICD-10-CM | POA: Diagnosis not present

## 2020-10-28 LAB — POCT INR: INR: 4.4 — AB (ref 2.0–3.0)

## 2020-10-28 NOTE — Patient Instructions (Signed)
Description   Spoke with Sharyn Lull RN with [facility] instructed to hold today's dose and then continue taking Warfarin 1.5 tablets daily except for 1 tablet on Mondays. Recheck in 1 week. Coumadin Clinic # [phone]

## 2020-10-29 ENCOUNTER — Encounter: Payer: Self-pay | Admitting: Orthopedic Surgery

## 2020-10-29 NOTE — Progress Notes (Signed)
Office Visit Note   Patient: [name]           Date of Birth: 1945/05/11           MRN: [id number] Visit Date: 10/22/2020              Requested by: Josetta Huddle, MD [address] [place],   [id number] PCP: Josetta Huddle, MD  Chief Complaint  Patient presents with   Left Leg - Follow-up    MRI review left femur       HPI: Patient is a 75 year old woman who is seen for evaluation for a left thigh soft tissue mass she is status post MRI scan.  Patient is currently in a wheelchair for pubic rami fractures.  Patient has had a DVT in the past and is currently on Coumadin.  Assessment & Plan: Visit Diagnoses:  1. Mass of soft tissue of thigh     Plan: We will plan for an excisional biopsy of the soft tissue mass.  Recommended holding the Coumadin 5 days before surgery.  Follow-Up Instructions: Return in about 2 weeks (around 11/05/2020).   Ortho Exam  Patient is alert, oriented, no adenopathy, well-dressed, normal affect, normal respiratory effort. Examination patient has a firm soft tissue mass lateral to the left thigh there is no cellulitis no tenderness to palpation no skin changes.  Reviewing the MRI scan the mass is 15 x 10 x 10 cm smoothly marginated well-circumscribed likely an encapsulated mass in the subcutaneous fat.  No bone changes.  There are no muscle lesions.  Imaging: No results found. No images are attached to the encounter.  Labs: Lab Results  Component Value Date   HGBA1C 6.0 (H) 09/04/2020   HGBA1C (H) 06/04/2010    6.3 (NOTE)                                                                       According to the ADA Clinical Practice Recommendations for 2011, when HbA1c is used as a screening test:   >=6.5%   Diagnostic of Diabetes Mellitus           (if abnormal result  is confirmed)  5.7-6.4%   Increased risk of developing Diabetes Mellitus  References:Diagnosis and Classification of Diabetes Mellitus,Diabetes  [id number] 1):S62-S69 and Standards of Medical Care in         Diabetes - 2011,Diabetes [id number]  (Suppl 1):S11-S61.   HGBA1C (H) 07/24/2008    6.3 (NOTE) The ADA recommends the following therapeutic goal for glycemic control related to Hgb A1c measurement: Goal of therapy: <6.5 Hgb A1c  Reference: American Diabetes Association: Clinical Practice Recommendations 2010, Diabetes Care, 2010, 33: (Suppl  1).   ESRSEDRATE 49 (H) 03/23/2017   CRP 7.1 (H) 03/23/2017   REPTSTATUS 03/30/2017 FINAL 03/25/2017   GRAMSTAIN  03/05/2017    MODERATE WBC PRESENT, PREDOMINANTLY PMN RARE GRAM POSITIVE COCCI IN PAIRS    CULT NO GROWTH 5 DAYS 03/25/2017   LABORGA STREPTOCOCCUS GROUP G 03/23/2017     Lab Results  Component Value Date   ALBUMIN 3.0 (L) 03/23/2017   ALBUMIN 2.4 (L) 03/08/2017   ALBUMIN 2.9 (L) 03/04/2017    Lab Results  Component Value Date  MG 1.8 06/03/2010   No results found for: VD25OH  No results found for: PREALBUMIN CBC EXTENDED Latest Ref Rng & Units 09/07/2020 09/05/2020 09/03/2020  WBC 4.0 - 10.5 K/uL 10.5 10.0 12.3(H)  RBC 3.87 - 5.11 MIL/uL 4.36 4.19 4.43  HGB 12.0 - 15.0 g/dL 13.7 13.5 14.2  HCT 36.0 - 46.0 % 42.9 40.7 44.8  PLT 150 - 400 K/uL 154 166 196  NEUTROABS 1.7 - 7.7 K/uL - - -  LYMPHSABS 0.7 - 4.0 K/uL - - -     There is no height or weight on file to calculate BMI.  Orders:  No orders of the defined types were placed in this encounter.  No orders of the defined types were placed in this encounter.    Procedures: No procedures performed  Clinical Data: No additional findings.  ROS:  All other systems negative, except as noted in the HPI. Review of Systems  Objective: Vital Signs: There were no vitals taken for this visit.  Specialty Comments:  No specialty comments available.  PMFS History: Patient Active Problem List   Diagnosis Date Noted   Acute on chronic systolic HF (heart failure) ([place]) 09/28/2020   Coccyx pain     CHF (congestive heart failure) ([place]) 09/04/2020   Acute respiratory failure with hypoxia ([place]) 09/04/2020   Fall at home, initial encounter 09/04/2020   Mitral valve stenosis    Essential hypertension 06/27/2017   History of lupus anticoagulant disorder 03/31/2017   NICM (nonischemic cardiomyopathy) ([place]) 03/31/2017   Streptococcal bacteremia    History of MRSA infection    Wound infection 03/24/2017   Cellulitis of right lower extremity 03/24/2017   Sepsis due to cellulitis ([place]) 03/04/2017   Popliteal artery embolus ([place]) 02/16/2017   Encounter for therapeutic drug monitoring 06/11/2013   History of CVA (cerebrovascular accident) 11/18/2010   Long term (current) use of anticoagulants 08/09/2010   Obesity, unspecified 08/14/2008   Permanent atrial fibrillation ([place]) 03/07/2008   History of pulmonary embolism 08/30/2005   Past Medical History:  Diagnosis Date   (HFpEF) heart failure with preserved ejection fraction ([place])    a. 06/2008 Echo: EF 55-60%, mild LVH, triv AI, mild to mod MS, mod to sev dil LA, mildly dil RA.   Acute right MCA stroke ([place])    a. [id number] Embolic stroke treated with TPA and thrombectomy with hemorrhagic transformation; Carotids 3/12 negative for ICA stenosis.   Bilateral Pulmonary Emboli    a. 08/2005 - s/p IVC filter.   Depression    Embolus of femoral artery ([place])    a. 06/2008 s/p bilateral embolectomies.   History of DVT (deep vein thrombosis)    a. s/p IVC filter.   HIT (heparin-induced thrombocytopenia) (HCC)    HTN (hypertension)    Lupus anticoagulant disorder (HCC)    Obesity, unspecified    Permanent atrial fibrillation ([place])    a. On chronic Coumadin - INRs followed by PCP; b. CHA2DS2VASc = 5.   Popliteal artery embolism, right ([place])    a. 01/2017 in the setting of subRx INR-->s/p embolectomy.    Family History  Problem Relation Age of Onset   Coronary artery disease Other    Diabetes Other     Past Surgical History:  Procedure  Laterality Date   distal radius fracture. (12,/16/2010)     EMBOLECTOMY Right 02/15/2017   Procedure: EMBOLECTOMY RIGHT LEG;  Surgeon: Elam Dutch, MD;  Location: [facility] OR;  Service: Vascular;  Laterality: Right;   I & D  EXTREMITY Right 03/05/2017   Procedure: IRRIGATION AND DEBRIDEMENT RIGHT LEG HEMATOMA EVACUATION;  Surgeon: Elam Dutch, MD;  Location: [facility] OR;  Service: Vascular;  Laterality: Right;   IVC Placement 08/30/2005     Open reduction and internal fixation of left intra-articular     Social History   Occupational History   Not on file  Tobacco Use   Smoking status: Never   Smokeless tobacco: Never  Vaping Use   Vaping Use: Never used  Substance and Sexual Activity   Alcohol use: No   Drug use: No   Sexual activity: Not Currently

## 2020-11-02 DIAGNOSIS — I4821 Permanent atrial fibrillation: Secondary | ICD-10-CM | POA: Diagnosis not present

## 2020-11-02 DIAGNOSIS — I5023 Acute on chronic systolic (congestive) heart failure: Secondary | ICD-10-CM | POA: Diagnosis not present

## 2020-11-02 DIAGNOSIS — I739 Peripheral vascular disease, unspecified: Secondary | ICD-10-CM | POA: Diagnosis not present

## 2020-11-02 DIAGNOSIS — I050 Rheumatic mitral stenosis: Secondary | ICD-10-CM | POA: Diagnosis not present

## 2020-11-02 DIAGNOSIS — M5136 Other intervertebral disc degeneration, lumbar region: Secondary | ICD-10-CM | POA: Diagnosis not present

## 2020-11-02 DIAGNOSIS — I110 Hypertensive heart disease with heart failure: Secondary | ICD-10-CM | POA: Diagnosis not present

## 2020-11-03 ENCOUNTER — Other Ambulatory Visit: Payer: Self-pay | Admitting: Adult Health

## 2020-11-03 DIAGNOSIS — I739 Peripheral vascular disease, unspecified: Secondary | ICD-10-CM | POA: Diagnosis not present

## 2020-11-03 DIAGNOSIS — I110 Hypertensive heart disease with heart failure: Secondary | ICD-10-CM | POA: Diagnosis not present

## 2020-11-03 DIAGNOSIS — Z7901 Long term (current) use of anticoagulants: Secondary | ICD-10-CM | POA: Diagnosis not present

## 2020-11-03 DIAGNOSIS — Z86711 Personal history of pulmonary embolism: Secondary | ICD-10-CM | POA: Diagnosis not present

## 2020-11-03 DIAGNOSIS — D6859 Other primary thrombophilia: Secondary | ICD-10-CM | POA: Diagnosis not present

## 2020-11-03 DIAGNOSIS — I050 Rheumatic mitral stenosis: Secondary | ICD-10-CM | POA: Diagnosis not present

## 2020-11-03 DIAGNOSIS — Z5181 Encounter for therapeutic drug level monitoring: Secondary | ICD-10-CM | POA: Diagnosis not present

## 2020-11-03 DIAGNOSIS — Z79891 Long term (current) use of opiate analgesic: Secondary | ICD-10-CM | POA: Diagnosis not present

## 2020-11-03 DIAGNOSIS — Z8673 Personal history of transient ischemic attack (TIA), and cerebral infarction without residual deficits: Secondary | ICD-10-CM | POA: Diagnosis not present

## 2020-11-03 DIAGNOSIS — R229 Localized swelling, mass and lump, unspecified: Secondary | ICD-10-CM | POA: Diagnosis not present

## 2020-11-03 DIAGNOSIS — Z86718 Personal history of other venous thrombosis and embolism: Secondary | ICD-10-CM | POA: Diagnosis not present

## 2020-11-03 DIAGNOSIS — W19XXXD Unspecified fall, subsequent encounter: Secondary | ICD-10-CM | POA: Diagnosis not present

## 2020-11-03 DIAGNOSIS — Z9181 History of falling: Secondary | ICD-10-CM | POA: Diagnosis not present

## 2020-11-03 DIAGNOSIS — M5136 Other intervertebral disc degeneration, lumbar region: Secondary | ICD-10-CM | POA: Diagnosis not present

## 2020-11-03 DIAGNOSIS — I4821 Permanent atrial fibrillation: Secondary | ICD-10-CM | POA: Diagnosis not present

## 2020-11-03 DIAGNOSIS — I5023 Acute on chronic systolic (congestive) heart failure: Secondary | ICD-10-CM | POA: Diagnosis not present

## 2020-11-04 ENCOUNTER — Ambulatory Visit (INDEPENDENT_AMBULATORY_CARE_PROVIDER_SITE_OTHER): Payer: Medicare Other

## 2020-11-04 DIAGNOSIS — Z5181 Encounter for therapeutic drug level monitoring: Secondary | ICD-10-CM

## 2020-11-04 DIAGNOSIS — I4821 Permanent atrial fibrillation: Secondary | ICD-10-CM

## 2020-11-04 DIAGNOSIS — Z86711 Personal history of pulmonary embolism: Secondary | ICD-10-CM

## 2020-11-04 DIAGNOSIS — I050 Rheumatic mitral stenosis: Secondary | ICD-10-CM | POA: Diagnosis not present

## 2020-11-04 DIAGNOSIS — I743 Embolism and thrombosis of arteries of the lower extremities: Secondary | ICD-10-CM

## 2020-11-04 DIAGNOSIS — I5023 Acute on chronic systolic (congestive) heart failure: Secondary | ICD-10-CM | POA: Diagnosis not present

## 2020-11-04 DIAGNOSIS — I110 Hypertensive heart disease with heart failure: Secondary | ICD-10-CM | POA: Diagnosis not present

## 2020-11-04 DIAGNOSIS — M5136 Other intervertebral disc degeneration, lumbar region: Secondary | ICD-10-CM | POA: Diagnosis not present

## 2020-11-04 DIAGNOSIS — I739 Peripheral vascular disease, unspecified: Secondary | ICD-10-CM | POA: Diagnosis not present

## 2020-11-04 LAB — POCT INR: INR: 3.3 — AB (ref 2.0–3.0)

## 2020-11-04 NOTE — Patient Instructions (Signed)
Description   Spoke with Sharyn Lull RN with [facility] instructed to have pt continue taking Warfarin 1.5 tablets daily except for 1 tablet on Mondays. Recheck in 1 week. Coumadin Clinic # [phone]

## 2020-11-09 ENCOUNTER — Other Ambulatory Visit: Payer: Self-pay

## 2020-11-09 DIAGNOSIS — M5136 Other intervertebral disc degeneration, lumbar region: Secondary | ICD-10-CM | POA: Diagnosis not present

## 2020-11-09 DIAGNOSIS — I5023 Acute on chronic systolic (congestive) heart failure: Secondary | ICD-10-CM | POA: Diagnosis not present

## 2020-11-09 DIAGNOSIS — I050 Rheumatic mitral stenosis: Secondary | ICD-10-CM | POA: Diagnosis not present

## 2020-11-09 DIAGNOSIS — I110 Hypertensive heart disease with heart failure: Secondary | ICD-10-CM | POA: Diagnosis not present

## 2020-11-09 DIAGNOSIS — I739 Peripheral vascular disease, unspecified: Secondary | ICD-10-CM | POA: Diagnosis not present

## 2020-11-09 DIAGNOSIS — I4821 Permanent atrial fibrillation: Secondary | ICD-10-CM | POA: Diagnosis not present

## 2020-11-11 ENCOUNTER — Ambulatory Visit (INDEPENDENT_AMBULATORY_CARE_PROVIDER_SITE_OTHER): Payer: Medicare Other

## 2020-11-11 DIAGNOSIS — I743 Embolism and thrombosis of arteries of the lower extremities: Secondary | ICD-10-CM | POA: Diagnosis not present

## 2020-11-11 DIAGNOSIS — Z5181 Encounter for therapeutic drug level monitoring: Secondary | ICD-10-CM | POA: Diagnosis not present

## 2020-11-11 DIAGNOSIS — I110 Hypertensive heart disease with heart failure: Secondary | ICD-10-CM | POA: Diagnosis not present

## 2020-11-11 DIAGNOSIS — I4821 Permanent atrial fibrillation: Secondary | ICD-10-CM | POA: Diagnosis not present

## 2020-11-11 DIAGNOSIS — M5136 Other intervertebral disc degeneration, lumbar region: Secondary | ICD-10-CM | POA: Diagnosis not present

## 2020-11-11 DIAGNOSIS — I050 Rheumatic mitral stenosis: Secondary | ICD-10-CM | POA: Diagnosis not present

## 2020-11-11 DIAGNOSIS — Z86711 Personal history of pulmonary embolism: Secondary | ICD-10-CM

## 2020-11-11 DIAGNOSIS — I5023 Acute on chronic systolic (congestive) heart failure: Secondary | ICD-10-CM | POA: Diagnosis not present

## 2020-11-11 DIAGNOSIS — I739 Peripheral vascular disease, unspecified: Secondary | ICD-10-CM | POA: Diagnosis not present

## 2020-11-11 LAB — POCT INR: INR: 3.3 — AB (ref 2.0–3.0)

## 2020-11-11 NOTE — Patient Instructions (Signed)
Description   Spoke with Sharyn Lull RN with [facility] instructed to have pt continue taking Warfarin 1.5 tablets daily except for 1 tablet on Mondays. Recheck in 2 weeks. Coumadin Clinic # [phone]

## 2020-11-12 DIAGNOSIS — I050 Rheumatic mitral stenosis: Secondary | ICD-10-CM | POA: Diagnosis not present

## 2020-11-12 DIAGNOSIS — I739 Peripheral vascular disease, unspecified: Secondary | ICD-10-CM | POA: Diagnosis not present

## 2020-11-12 DIAGNOSIS — M5136 Other intervertebral disc degeneration, lumbar region: Secondary | ICD-10-CM | POA: Diagnosis not present

## 2020-11-12 DIAGNOSIS — I4821 Permanent atrial fibrillation: Secondary | ICD-10-CM | POA: Diagnosis not present

## 2020-11-12 DIAGNOSIS — I5023 Acute on chronic systolic (congestive) heart failure: Secondary | ICD-10-CM | POA: Diagnosis not present

## 2020-11-12 DIAGNOSIS — I110 Hypertensive heart disease with heart failure: Secondary | ICD-10-CM | POA: Diagnosis not present

## 2020-11-18 DIAGNOSIS — I739 Peripheral vascular disease, unspecified: Secondary | ICD-10-CM | POA: Diagnosis not present

## 2020-11-18 DIAGNOSIS — I5023 Acute on chronic systolic (congestive) heart failure: Secondary | ICD-10-CM | POA: Diagnosis not present

## 2020-11-18 DIAGNOSIS — I050 Rheumatic mitral stenosis: Secondary | ICD-10-CM | POA: Diagnosis not present

## 2020-11-18 DIAGNOSIS — M5136 Other intervertebral disc degeneration, lumbar region: Secondary | ICD-10-CM | POA: Diagnosis not present

## 2020-11-18 DIAGNOSIS — I110 Hypertensive heart disease with heart failure: Secondary | ICD-10-CM | POA: Diagnosis not present

## 2020-11-18 DIAGNOSIS — I4821 Permanent atrial fibrillation: Secondary | ICD-10-CM | POA: Diagnosis not present

## 2020-11-19 DIAGNOSIS — I4821 Permanent atrial fibrillation: Secondary | ICD-10-CM | POA: Diagnosis not present

## 2020-11-19 DIAGNOSIS — I050 Rheumatic mitral stenosis: Secondary | ICD-10-CM | POA: Diagnosis not present

## 2020-11-19 DIAGNOSIS — I5023 Acute on chronic systolic (congestive) heart failure: Secondary | ICD-10-CM | POA: Diagnosis not present

## 2020-11-19 DIAGNOSIS — I110 Hypertensive heart disease with heart failure: Secondary | ICD-10-CM | POA: Diagnosis not present

## 2020-11-19 DIAGNOSIS — M5136 Other intervertebral disc degeneration, lumbar region: Secondary | ICD-10-CM | POA: Diagnosis not present

## 2020-11-19 DIAGNOSIS — I739 Peripheral vascular disease, unspecified: Secondary | ICD-10-CM | POA: Diagnosis not present

## 2020-11-23 DIAGNOSIS — I4821 Permanent atrial fibrillation: Secondary | ICD-10-CM | POA: Diagnosis not present

## 2020-11-23 DIAGNOSIS — I739 Peripheral vascular disease, unspecified: Secondary | ICD-10-CM | POA: Diagnosis not present

## 2020-11-23 DIAGNOSIS — I110 Hypertensive heart disease with heart failure: Secondary | ICD-10-CM | POA: Diagnosis not present

## 2020-11-23 DIAGNOSIS — M5136 Other intervertebral disc degeneration, lumbar region: Secondary | ICD-10-CM | POA: Diagnosis not present

## 2020-11-23 DIAGNOSIS — I5023 Acute on chronic systolic (congestive) heart failure: Secondary | ICD-10-CM | POA: Diagnosis not present

## 2020-11-23 DIAGNOSIS — I050 Rheumatic mitral stenosis: Secondary | ICD-10-CM | POA: Diagnosis not present

## 2020-11-25 ENCOUNTER — Telehealth: Payer: Self-pay

## 2020-11-25 NOTE — Telephone Encounter (Signed)
Spoke with Sharyn Lull, [facility] RN and stated INR will be checked tomorrow 9/1.

## 2020-11-26 ENCOUNTER — Ambulatory Visit (INDEPENDENT_AMBULATORY_CARE_PROVIDER_SITE_OTHER): Payer: Medicare Other

## 2020-11-26 DIAGNOSIS — Z5181 Encounter for therapeutic drug level monitoring: Secondary | ICD-10-CM

## 2020-11-26 DIAGNOSIS — I739 Peripheral vascular disease, unspecified: Secondary | ICD-10-CM | POA: Diagnosis not present

## 2020-11-26 DIAGNOSIS — I4821 Permanent atrial fibrillation: Secondary | ICD-10-CM | POA: Diagnosis not present

## 2020-11-26 DIAGNOSIS — I050 Rheumatic mitral stenosis: Secondary | ICD-10-CM | POA: Diagnosis not present

## 2020-11-26 DIAGNOSIS — M5136 Other intervertebral disc degeneration, lumbar region: Secondary | ICD-10-CM | POA: Diagnosis not present

## 2020-11-26 DIAGNOSIS — I5023 Acute on chronic systolic (congestive) heart failure: Secondary | ICD-10-CM | POA: Diagnosis not present

## 2020-11-26 DIAGNOSIS — I110 Hypertensive heart disease with heart failure: Secondary | ICD-10-CM | POA: Diagnosis not present

## 2020-11-26 LAB — POCT INR: INR: 2.1 (ref 2.0–3.0)

## 2020-11-26 NOTE — Patient Instructions (Signed)
Description   Spoke with Sharyn Lull RN with [facility] instructed to have pt take 2 tablets today and then continue taking Warfarin 1.5 tablets daily except for 1 tablet on Mondays. Recheck in 2 weeks. Coumadin Clinic # [phone]

## 2020-12-01 NOTE — Progress Notes (Signed)
Surgical Instructions    Your procedure is scheduled on Wednesday, September 14th, 2022.   Report to [facility] Main Entrance "A" at 06:30 A.M., then check in with the Admitting office.  Call this number if you have problems the morning of surgery:  [phone]   If you have any questions prior to your surgery date call [phone]: Open Monday-Friday 8am-4pm    Remember:  Do not eat after midnight the night before your surgery  You may drink clear liquids until 05:30 the morning of your surgery.   Clear liquids allowed are: Water, Non-Citrus Juices (without pulp), Carbonated Beverages, Clear Tea, Black Coffee ONLY (NO MILK, CREAM OR POWDERED CREAMER of any kind), and Gatorade    Take these medicines the morning of surgery with A SIP OF WATER:  FLUoxetine (PROZAC) metoprolol succinate (TOPROL-XL)  If needed:  acetaminophen (TYLENOL)  cetirizine (ZYRTEC)  methocarbamol (ROBAXIN) traMADol (ULTRAM)  Follow your surgeon's instructions on when to stop Aspirin and warfarin (COUMADIN).  If no instructions were given by your surgeon then you will need to call the office to get those instructions.     As of today, STOP taking any Aspirin (unless otherwise instructed by your surgeon) Aleve, Naproxen, Ibuprofen, Motrin, Advil, Goody's, BC's, all herbal medications, fish oil, and all vitamins.           Do not wear jewelry or makeup Do not wear lotions, powders, perfumes, or deodorant. Do not shave 48 hours prior to surgery.   Do not bring valuables to the hospital. DO Not wear nail polish, gel polish, artificial nails, or any other type of covering on natural nails including finger and   toenails. If patients have artificial nails, gel coating, etc. that need to be removed by a nail salon please have this removed prior to surgery or surgery may need to be canceled/delayed if the surgeon/ anesthesia feels like the patient is unable to be adequately monitored.              [facility]  is not responsible for any belongings or valuables.  Do NOT Smoke (Tobacco/Vaping)  24 hours prior to your procedure If you use a CPAP at night, you may bring all equipment for your overnight stay.   Contacts, glasses, dentures or bridgework may not be worn into surgery, please bring cases for these belongings   For patients admitted to the hospital, discharge time will be determined by your treatment team.   Patients discharged the day of surgery will not be allowed to drive home, and someone needs to stay with them for 24 hours.  ONLY 1 SUPPORT PERSON MAY BE PRESENT WHILE YOU ARE IN SURGERY. IF YOU ARE TO BE ADMITTED ONCE YOU ARE IN YOUR ROOM YOU WILL BE ALLOWED TWO (2) VISITORS.  Minor children may have two parents present. Special consideration for safety and communication needs will be reviewed on a case by case basis.  Special instructions:    Oral Hygiene is also important to reduce your risk of infection.  Remember - BRUSH YOUR TEETH THE MORNING OF SURGERY WITH YOUR REGULAR TOOTHPASTE   [place]- Preparing For Surgery  Before surgery, you can play an important role. Because skin is not sterile, your skin needs to be as free of germs as possible. You can reduce the number of germs on your skin by washing with CHG (chlorahexidine gluconate) Soap before surgery.  CHG is an antiseptic cleaner which kills germs and bonds with the skin to continue killing  germs even after washing.     Please do not use if you have an allergy to CHG or antibacterial soaps. If your skin becomes reddened/irritated stop using the CHG.  Do not shave (including legs and underarms) for at least 48 hours prior to first CHG shower. It is OK to shave your face.  Please follow these instructions carefully.     Shower the NIGHT BEFORE SURGERY and the MORNING OF SURGERY with CHG Soap.   If you chose to wash your hair, wash your hair first as usual with your normal shampoo. After you shampoo, rinse your hair and  body thoroughly to remove the shampoo.  Then [employer] and genitals (private parts) with your normal soap and rinse thoroughly to remove soap.  After that Use CHG Soap as you would any other liquid soap. You can apply CHG directly to the skin and wash gently with a scrungie or a clean washcloth.   Apply the CHG Soap to your body ONLY FROM THE NECK DOWN.  Do not use on open wounds or open sores. Avoid contact with your eyes, ears, mouth and genitals (private parts). Wash Face and genitals (private parts)  with your normal soap.   Wash thoroughly, paying special attention to the area where your surgery will be performed.  Thoroughly rinse your body with warm water from the neck down.  DO NOT shower/wash with your normal soap after using and rinsing off the CHG Soap.  Pat yourself dry with a CLEAN TOWEL.  Wear CLEAN PAJAMAS to bed the night before surgery  Place CLEAN SHEETS on your bed the night before your surgery  DO NOT SLEEP WITH PETS.   Day of Surgery:  Take a shower with CHG soap. Wear Clean/Comfortable clothing the morning of surgery Do not apply any deodorants/lotions.   Remember to brush your teeth WITH YOUR REGULAR TOOTHPASTE.   Please read over the following fact sheets that you were given.

## 2020-12-02 ENCOUNTER — Encounter (HOSPITAL_COMMUNITY)
Admission: RE | Admit: 2020-12-02 | Discharge: 2020-12-02 | Disposition: A | Discharge status: Home / Self Care | Payer: Medicare Other | Source: Ambulatory Visit | Attending: Orthopedic Surgery | Admitting: Orthopedic Surgery

## 2020-12-02 ENCOUNTER — Encounter (HOSPITAL_COMMUNITY): Payer: Self-pay

## 2020-12-02 ENCOUNTER — Other Ambulatory Visit: Payer: Self-pay

## 2020-12-02 ENCOUNTER — Telehealth: Payer: Self-pay | Admitting: *Deleted

## 2020-12-02 DIAGNOSIS — Z01812 Encounter for preprocedural laboratory examination: Secondary | ICD-10-CM | POA: Diagnosis not present

## 2020-12-02 DIAGNOSIS — I110 Hypertensive heart disease with heart failure: Secondary | ICD-10-CM | POA: Diagnosis not present

## 2020-12-02 DIAGNOSIS — I4821 Permanent atrial fibrillation: Secondary | ICD-10-CM | POA: Diagnosis not present

## 2020-12-02 DIAGNOSIS — I739 Peripheral vascular disease, unspecified: Secondary | ICD-10-CM | POA: Diagnosis not present

## 2020-12-02 DIAGNOSIS — I050 Rheumatic mitral stenosis: Secondary | ICD-10-CM | POA: Diagnosis not present

## 2020-12-02 DIAGNOSIS — D1724 Benign lipomatous neoplasm of skin and subcutaneous tissue of left leg: Secondary | ICD-10-CM | POA: Insufficient documentation

## 2020-12-02 DIAGNOSIS — M5136 Other intervertebral disc degeneration, lumbar region: Secondary | ICD-10-CM | POA: Diagnosis not present

## 2020-12-02 DIAGNOSIS — I5023 Acute on chronic systolic (congestive) heart failure: Secondary | ICD-10-CM | POA: Diagnosis not present

## 2020-12-02 HISTORY — DX: Unspecified osteoarthritis, unspecified site: M19.90

## 2020-12-02 HISTORY — DX: Rheumatic mitral stenosis: I05.0

## 2020-12-02 LAB — BASIC METABOLIC PANEL
Anion gap: 6 (ref 5–15)
BUN: 12 mg/dL (ref 8–23)
CO2: 29 mmol/L (ref 22–32)
Calcium: 9.6 mg/dL (ref 8.9–10.3)
Chloride: 102 mmol/L (ref 98–111)
Creatinine, Ser: 0.92 mg/dL (ref 0.44–1.00)
GFR, Estimated: >60 mL/min (ref >60)
Glucose, Bld: 91 mg/dL (ref 70–99)
Potassium: 4.3 mmol/L (ref 3.5–5.1)
Sodium: 137 mmol/L (ref 135–145)

## 2020-12-02 LAB — CBC
HCT: 39.3 % (ref 36.0–46.0)
Hemoglobin: 12.3 g/dL (ref 12.0–15.0)
MCH: 32.5 pg (ref 26.0–34.0)
MCHC: 31.3 g/dL (ref 30.0–36.0)
MCV: 104 fL — ABNORMAL HIGH (ref 80.0–100.0)
Platelets: 223 10*3/uL (ref 150–400)
RBC: 3.78 MIL/uL — ABNORMAL LOW (ref 3.87–5.11)
RDW: 13.9 % (ref 11.5–15.5)
WBC: 7.3 10*3/uL (ref 4.0–10.5)
nRBC: 0 % (ref 0.0–0.2)

## 2020-12-02 NOTE — Telephone Encounter (Addendum)
Patient with diagnosis of afib/lupus anticoagulation disorder on warfarin for anticoagulation.    Pt also with hx of DVTs and bilateral PEs, with an IVC filter.  She has also had bilateral femoral artery embolectomy on 06/2008, history of embolic CVA in 0000000 requiring TPA with subsequent hemorrhage conversion.  She also was found to have a right lower extremity popliteal embolism in the setting of subtherapeutic INR of 1.43 requiring surgical intervention  Procedure: EXCISION LIPOMA LEFT THIGH Date of procedure: 12/09/20  CHA2DS2-VASc Score = 6   This indicates a 9.7% annual risk of stroke. The patient's score is based upon: CHF History: Yes HTN History: Yes Diabetes History: No Stroke History: Yes Vascular Disease History: No Age Score: 1 Gender Score: 1     CrCl 57 ml/min Platelet count 154  Per office protocol, patient can hold warfarin for 5 days prior to procedure.    Patient WILL need bridging around procedure. However patient has a heparin allergy.  Unknown if patient will have same reaction to lovenox that she did to heparin (severe rash).  In previous hospital admissions, patient refused to try lovenox injection. If patient does not use lovenox, then she would need to be admitted prior to the procedure for agatroban infusion. She could do Arixtra post procedure, but the half life is too long to do prior to procedure.  I will let Dr. Percival Spanish and Dr. Sharol Given comment

## 2020-12-02 NOTE — Telephone Encounter (Signed)
I spoke with patient and her daughter [name]. Patient willing to try lovenox. Advised to take an antihistamine like claritin, benadryl (can cause drowsiness) prior to injection. If she has a reaction on Saturday she is to call the office and speak with the on call cardiologist PA. Reviewed injection technique over the phone with her daughter. Rotate sites. Will send instructions via mychart. Will call home health RN with INR re-check date. I advised patient to take 2 tablets of warfarin tonight.

## 2020-12-02 NOTE — Telephone Encounter (Signed)
Home health called and stated that pt's INR was 2.1 today (9/7).    Informed home health that we will call later on and give pt the dosing instructions. Also, verified that pt is set up with mychart so pt will be able to see a copy of instructions via mychart.

## 2020-12-02 NOTE — Progress Notes (Signed)
PCP - Dr. Josetta Huddle Cardiologist - Dr. Minus Breeding  PPM/ICD - denies   Chest x-ray - 09/03/20 EKG - 09/29/20 Stress Test - denies ECHO - 09/04/20 Cardiac Cath - denies  Sleep Study - denies   DM: denies  Aspirin/Coumadin Instructions: Notes in chart about possible heparin bridge or lovenox. Left for Dr. Sharol Given to advise. James contacted Dr. Jess Barters office. Pt instructed to wait for Dr. Jess Barters office to call her and let her know instructions about aspirin and coumadin,  ERAS Protcol - yes, no drink  COVID TEST- n/a, ambulatory   Anesthesia review: yes, cardiac hx  Patient denies shortness of breath, fever, cough and chest pain at PAT appointment   All instructions explained to the patient, with a verbal understanding of the material. Patient agrees to go over the instructions while at home for a better understanding. The opportunity to ask questions was provided.

## 2020-12-02 NOTE — Telephone Encounter (Signed)
[facility] called and stated that pt was going to see Dr. Sharol Given today and that pt is going to have a procedure on 9/14. [facility] nurse said that Dr. Sharol Given was going to ask pt hold warfarin for 5 days. Informed her that Dr. Sharol Given will need to send a clearance request.   Since [facility] is already at pt's home instructed her to go ahead and check pt's INR  and will send message to pharm D to see if pt will need to be bridged with Lovenox.   Best contact number to reach home health is [phone].

## 2020-12-03 ENCOUNTER — Encounter (HOSPITAL_COMMUNITY): Payer: Self-pay

## 2020-12-03 DIAGNOSIS — I4821 Permanent atrial fibrillation: Secondary | ICD-10-CM | POA: Diagnosis not present

## 2020-12-03 DIAGNOSIS — I5022 Chronic systolic (congestive) heart failure: Secondary | ICD-10-CM | POA: Diagnosis not present

## 2020-12-03 DIAGNOSIS — Z7982 Long term (current) use of aspirin: Secondary | ICD-10-CM | POA: Diagnosis not present

## 2020-12-03 DIAGNOSIS — I739 Peripheral vascular disease, unspecified: Secondary | ICD-10-CM | POA: Diagnosis not present

## 2020-12-03 DIAGNOSIS — I110 Hypertensive heart disease with heart failure: Secondary | ICD-10-CM | POA: Diagnosis not present

## 2020-12-03 DIAGNOSIS — D6859 Other primary thrombophilia: Secondary | ICD-10-CM | POA: Diagnosis not present

## 2020-12-03 DIAGNOSIS — I050 Rheumatic mitral stenosis: Secondary | ICD-10-CM | POA: Diagnosis not present

## 2020-12-03 DIAGNOSIS — Z86718 Personal history of other venous thrombosis and embolism: Secondary | ICD-10-CM | POA: Diagnosis not present

## 2020-12-03 DIAGNOSIS — Z7901 Long term (current) use of anticoagulants: Secondary | ICD-10-CM | POA: Diagnosis not present

## 2020-12-03 DIAGNOSIS — Z9181 History of falling: Secondary | ICD-10-CM | POA: Diagnosis not present

## 2020-12-03 DIAGNOSIS — Z86711 Personal history of pulmonary embolism: Secondary | ICD-10-CM | POA: Diagnosis not present

## 2020-12-03 DIAGNOSIS — M5136 Other intervertebral disc degeneration, lumbar region: Secondary | ICD-10-CM | POA: Diagnosis not present

## 2020-12-03 DIAGNOSIS — Z8673 Personal history of transient ischemic attack (TIA), and cerebral infarction without residual deficits: Secondary | ICD-10-CM | POA: Diagnosis not present

## 2020-12-03 MED ORDER — ENOXAPARIN SODIUM 120 MG/0.8ML IJ SOSY: 120.0000 mg | PREFILLED_SYRINGE | Freq: Two times a day (BID) | INTRAMUSCULAR | 1 refills | Status: DC

## 2020-12-03 NOTE — Progress Notes (Addendum)
Anesthesia Chart Review:  Case: [id number] Date/Time: 12/09/20 0815   Procedure: EXCISION LIPOMA LEFT THIGH (Left)   Anesthesia type: Choice   Pre-op diagnosis: Lipoma mass Left Thigh   Location: [facility] OR ROOM 03 / [place] OR   Surgeons: [name], MD       DISCUSSION: [name] is a 75 year old female scheduled for the above procedure.  History includes never smoker, HTN, afib (DCCV 10/10/05), CHF, mitral stenosis (moderate-severe 09/04/20 echo), lupus anticoagulant disorder, CVA (06/03/10, s/p tPA/thrombectomy with hemorrhagic transformation), HIT, recurrent thromboembolic event (DVT/bilateral PE 08/2005, s/p IVC filter; bilateral ileofemoral embolectomies 07/22/08; right popliteal artery embolectomy [id number] complicated by MRSA RLE abscess s/p I&D 03/05/17; admission 03/2017 for RLE cellulitis with Group G Strep bacteremia). BMi is consistent with morbid obesity.   Last cardiology visit with Dr. Percival Spanish on 09/29/20 following June 2022 hospitalization for acute respiratory failure and acute on chronic systolic CHF. [name] was treated with IV Lasix. Metoprolol increased. Echo showed LVEF 55-60% (improved from 40-45 in 2018) without regional wall motion abnormalities, new moderate-severe MS, moderately reduced RVSF, mildly elevated PASP.  [name] euvolemic then. Was doing better at weighing herself and watching salt intake. Tolerating warfarin. Planning to repeat echo in 6 months to verify degree of MS, as he wondered if it was an inaccurate measurement. The added, "Very likely I will follow this clinically and only would suggest a TEE if there is a marked difference between the exams." Six month follow-up planned.   Anticoagulation recommendations per notations by Marcelle Overlie, RPH-CPP, "Per office protocol, [name] can hold warfarin for 5 days prior to procedure.     [name] WILL need bridging around procedure. However [name] has a heparin allergy.   Unknown if [name] will have same reaction to lovenox that  [name] did to heparin (severe rash).  In previous hospital admissions, [name] refused to try lovenox injection. If [name] does not use lovenox, then [name] would need to be admitted prior to the procedure for agatroban infusion. [name] could do Arixtra post procedure, but the half life is too long to do prior to procedure." [name] agreeable to try Lovenox bridge. Marcelle Overlie sent communication to Dr. Percival Spanish and Dr. Sharol Given.  Preoperative cardiology provider input outlined on 12/04/20 by Almyra Deforest, [place], "[name] was contacted 12/04/2020 in reference to pre-operative risk assessment for pending surgery as outlined below.  Rashia Sheff Scala was last seen on 09/29/2020 by Dr. Percival Spanish.  Since that day, SHIRAN CLAW has done well without any chest pain or worsening dyspnea.  Based on the previous office note, [name] has a history of nonischemic cardiomyopathy.  Recent echocardiogram showed normalization of ejection fraction.   Therefore, based on ACC/AHA guidelines, the [name] would be at acceptable risk for the planned procedure without further cardiovascular testing..."   Will plan to PT/INR on the day of surgery.  Anesthesia team to evaluate on the day of surgery.    VS: BP (!) 157/80   Pulse 82   Temp 36.8 C (Oral)   Resp 18   Ht 5' 5.5" (1.664 m)   Wt 119.9 kg   SpO2 97%   BMI 43.31 kg/m    PROVIDERS: Josetta Huddle, MD is PCP  Minus Breeding, MD is cardiologist   LABS: Labs reviewed: Acceptable for surgery. PT/INR day of surgery.  (all labs ordered are listed, but only abnormal results are displayed)  Labs Reviewed  CBC - Abnormal; Notable for the following components:      Result Value   RBC  3.78 (*)    MCV 104.0 (*)    All other components within normal limits  BASIC METABOLIC PANEL     IMAGES: MRI left femur 10/08/20: IMPRESSION: 1. 15.0 x 10.5 x 10.0 cm smoothly marginated well circumscribed and likely encapsulated mass in the subcutaneous fat of the upper lateral thigh as  detailed above. Exam is somewhat limited without contrast but I do not see any aggressive imaging features. Given its size and the fact that it is clinically enlarging would recommend excisional biopsy. 2. No muscle lesions or bone lesions. 3. Moderate left hip joint degenerative changes but no stress fracture or AVN.   MRI pelvis/sacrum 10/05/20: IMPRESSION: 1. Fairly extensive sacral insufficiency fractures with significant surrounding marrow edema likely accounts for the [name]'s sacral pain. 2. Mild age related degenerative changes involving both hips but no stress fracture or AVN. 3. No significant intrapelvic abnormalities.   1V PCXR 09/03/20: IMPRESSION: - Technically limited examination without radiographic evidence of acute cardiopulmonary disease. - Stable mild-to-moderate cardiomegaly with development of pulmonary vascular congestion    EKG: 09/29/20: Afib at 84 bpm. Non-specific ST and T wave abnormality.    CV: Echo 09/04/20: Sonographer Comments: Technically challenging study due to limited  acoustic windows. Image acquisition challenging due to [name] body  habitus.  IMPRESSIONS   1. Left ventricular ejection fraction, by estimation, is 55 to 60%. The  left ventricle has normal function. The left ventricle has no regional  wall motion abnormalities. There is mild concentric left ventricular  hypertrophy. Left ventricular diastolic  function could not be evaluated.   2. Right ventricular systolic function is moderately reduced. The right  ventricular size is moderately enlarged. There is mildly elevated  pulmonary artery systolic pressure.   3. Left atrial size was severely dilated.   4. Right atrial size was severely dilated.   5. The mitral valve is rheumatic. There is mild calcification of the  anterior and posterior mitral valve leaflet(s). Moderately decreased  mobility of the mitral valve leaflets. Mild mitral annular calcification.  Mild mitral valve  regurgitation. Moderate   to severe mitral stenosis. The mean mitral valve gradient is 10.5 mmHg at  an average heart rate in atrial fibrillation of 78bpm.   6. The aortic valve was not well visualized. Aortic valve regurgitation  is mild. No aortic stenosis is present.   7. The inferior vena cava is normal in size with greater than 50%  respiratory variability, suggesting right atrial pressure of 3 mmHg.  - Comparison 03/26/17: LVEF 40-45%, regional wall motion abnormalities cannot be excluded, septal dyssynergy consistent with RV-LV interaction, normal RVSF, mld MR, massively dilated LA, moderately dilated RA.    Past Medical History:  Diagnosis Date   (HFpEF) heart failure with preserved ejection fraction ([place])    a. 06/2008 Echo: EF 55-60%, mild LVH, triv AI, mild to mod MS, mod to sev dil LA, mildly dil RA.   Acute right MCA stroke ([place])    a. [id number] Embolic stroke treated with TPA and thrombectomy with hemorrhagic transformation; Carotids 3/12 negative for ICA stenosis.   Arthritis    Bilateral Pulmonary Emboli    a. 08/2005 - s/p IVC filter.   CHF (congestive heart failure) (H[facility])    Depression    Embolus of femoral artery ([place])    a. 06/2008 s/p bilateral embolectomies.   History of DVT (deep vein thrombosis)    a. s/p IVC filter.   HIT (heparin-induced thrombocytopenia) (H[facility])    HTN (hypertension)  Lupus anticoagulant disorder (H[facility])    Obesity, unspecified    Permanent atrial fibrillation ([place])    a. On chronic Coumadin - INRs followed by PCP; b. CHA2DS2VASc = 5.   Popliteal artery embolism, right ([place])    a. 01/2017 in the setting of subRx INR-->s/p embolectomy.    Past Surgical History:  Procedure Laterality Date   distal radius fracture. (12,/16/2010)     EMBOLECTOMY Right 02/15/2017   Procedure: EMBOLECTOMY RIGHT LEG;  Surgeon: Elam Dutch, MD;  [facility] OR;  Service: Vascular;  Laterality: Right;   EYE SURGERY Bilateral 2021   cataracts removed   I &  D EXTREMITY Right 03/05/2017   Procedure: IRRIGATION AND DEBRIDEMENT RIGHT LEG HEMATOMA EVACUATION;  Surgeon: Elam Dutch, MD;  Location: [facility] OR;  Service: Vascular;  Laterality: Right;   IVC Placement 08/30/2005     Open reduction and internal fixation of left intra-articular      MEDICATIONS:  acetaminophen (TYLENOL) 500 MG tablet   aspirin 81 MG tablet   calcitonin, salmon, (MIACALCIN/FORTICAL) 200 UNIT/ACT nasal spray   Calcium Carb-Cholecalciferol (CALCIUM 600 + D PO)   Cholecalciferol (VITAMIN D) 50 MCG (2000 UT) tablet   enoxaparin (LOVENOX) 120 MG/0.8ML injection   FLUoxetine (PROZAC) 20 MG capsule   furosemide (LASIX) 40 MG tablet   lidocaine (LIDODERM) 5 %   losartan (COZAAR) 100 MG tablet   methocarbamol (ROBAXIN) 500 MG tablet   metoprolol succinate (TOPROL-XL) 50 MG 24 hr tablet   metoprolol tartrate (LOPRESSOR) 25 MG tablet   potassium chloride (K-DUR) 10 MEQ tablet   traMADol (ULTRAM) 50 MG tablet   warfarin (COUMADIN) 3 MG tablet   No current facility-administered medications for this encounter.    Myra Gianotti, PA-C Surgical Short Stay/Anesthesiology [facility] Phone [phone] [facility] Phone [phone] 12/04/2020 4:04 PM

## 2020-12-03 NOTE — Telephone Encounter (Signed)
Called home health Rn and LVM to recheck INR 9/20. Requested call back to confirm.

## 2020-12-04 ENCOUNTER — Telehealth (HOSPITAL_COMMUNITY): Payer: Self-pay | Admitting: Vascular Surgery

## 2020-12-04 ENCOUNTER — Telehealth: Payer: Self-pay | Admitting: *Deleted

## 2020-12-04 NOTE — Telephone Encounter (Signed)
   Name: [name]  DOB: 1945-12-08  MRN: [id number]   Primary Cardiologist: Minus Breeding, MD  Chart reviewed as part of pre-operative protocol coverage. Patient was contacted 12/04/2020 in reference to pre-operative risk assessment for pending surgery as outlined below.  [name] was last seen on 09/29/2020 by Dr. Percival Spanish.  Since that day, [name] has done well without any chest pain or worsening dyspnea.  Based on the previous office note, she has a history of nonischemic cardiomyopathy.  Recent echocardiogram showed normalization of ejection fraction.  Therefore, based on ACC/AHA guidelines, the patient would be at acceptable risk for the planned procedure without further cardiovascular testing.   The patient was advised that if she develops new symptoms prior to surgery to contact our office to arrange for a follow-up visit, and she verbalized understanding.  I will route this recommendation to the requesting party via Epic fax function and remove from pre-op pool. Please call with questions.  [place], [place] 12/04/2020, 1:54 PM

## 2020-12-04 NOTE — Progress Notes (Signed)
Request for surgical clearance:   1.      What type of surgery is being performed? Excision of lipoma left thigh   2.      When is this surgery scheduled? 12/09/2020   3.      What type of clearance is required (medical clearance vs. Pharmacy clearance to hold med vs. Both)? Both, but pharmacist is already working on anticoagulation instructions   4.      Are there any medications that need to be held prior to surgery and how long? Pharmacist is already addressing   5.      Practice name and name of physician performing surgery? Meridee Score, MD   6.      What is the office phone number? [phone] 7.   What is the office fax number? [phone]   8.   Anesthesia type (None, local, MAC, general) ? Choice. May require require general anesthesia.     Nyala Kirchner W. 12/04/2020, 10:47 AM

## 2020-12-04 NOTE — Telephone Encounter (Signed)
1.      What type of surgery is being performed? Excision of lipoma left thigh     2.      When is this surgery scheduled? 12/09/2020     3.      What type of clearance is required (medical clearance vs. Pharmacy clearance to hold med vs. Both)? Both, but pharmacist is already working on anticoagulation instructions     4.      Are there any medications that need to be held prior to surgery and how long? Pharmacist is already addressing     5.      Practice name and name of physician performing surgery? Meridee Score, MD     6.   What is the office phone number? [phone]  7.   What is the office fax number? [phone]     8.   Anesthesia type (None, local, MAC, general) ? Choice

## 2020-12-04 NOTE — Anesthesia Preprocedure Evaluation (Addendum)
Anesthesia Evaluation  Patient identified by MRN, date of birth, ID band Patient awake    Reviewed: Allergy & Precautions, NPO status , Patient's Chart, lab work & pertinent test results, reviewed documented beta blocker date and time   Airway Mallampati: II  TM Distance: >3 FB Neck ROM: Full    Dental  (+) Missing, Dental Advisory Given, Upper Dentures,    Pulmonary PE   Pulmonary exam normal breath sounds clear to auscultation       Cardiovascular hypertension, Pt. on medications and Pt. on home beta blockers + Peripheral Vascular Disease, +CHF and + DVT  + dysrhythmias Atrial Fibrillation + Valvular Problems/Murmurs  Rhythm:Irregular Rate:Normal  Hx/o bilateral PTE with IVC filter placement 08/2005 Hx/o bilateral femoral emboli 06/2008 Permanent atrial fibrillation Hx/o popliteal embolism 01/2017 S/P IVC filter placement  EKG 09/29/20 Atrial fibrillation 84/min  Echo 09/04/20 1. Left ventricular ejection fraction, by estimation, is 55 to 60%. The left ventricle has normal function. The left ventricle has no regional wall motion abnormalities. There is mild concentric left ventricular hypertrophy. Left ventricular diastolic function could not be evaluated.  2. Right ventricular systolic function is moderately reduced. The right ventricular size is moderately enlarged. There is mildly elevated pulmonary artery systolic pressure.  3. Left atrial size was severely dilated.  4. Right atrial size was severely dilated.  5. The mitral valve is rheumatic. There is mild calcification of the  anterior and posterior mitral valve leaflet(s). Moderately decreased mobility of the mitral valve leaflets. Mild mitral annular calcification. Mild mitral valve regurgitation. Moderate to severe mitral stenosis. The mean mitral valve gradient is 10.5 mmHg at an average heart rate in atrial fibrillation of 78bpm.  6. The aortic valve was not well  visualized. Aortic valve regurgitation is mild. No aortic stenosis is present.  7. The inferior vena cava is normal in size with greater than 50% respiratory variability, suggesting right atrial pressure of 3 mmHg.    Neuro/Psych PSYCHIATRIC DISORDERS Depression Mild weakness right side CVA, Residual Symptoms    GI/Hepatic negative GI ROS, Neg liver ROS,   Endo/Other  Morbid obesity  Renal/GU negative Renal ROS  negative genitourinary   Musculoskeletal  (+) Arthritis , Osteoarthritis,  Lipoma/mass left thigh   Abdominal (+) + obese,   Peds  Hematology Coumadin therapy- last dose 9/8 Lovenox therapy- last dose yesterday am Hx/o HIT Hx/o Lupus anticoagulant   Anesthesia Other Findings   Reproductive/Obstetrics                          Anesthesia Physical Anesthesia Plan  ASA: 3  Anesthesia Plan: General   Post-op Pain Management:    Induction: Intravenous  PONV Risk Score and Plan: 4 or greater and Treatment may vary due to age or medical condition and Ondansetron  Airway Management Planned: LMA  Additional Equipment:   Intra-op Plan:   Post-operative Plan: Extubation in OR  Informed Consent: I have reviewed the patients History and Physical, chart, labs and discussed the procedure including the risks, benefits and alternatives for the proposed anesthesia with the patient or authorized representative who has indicated his/her understanding and acceptance.     Dental advisory given  Plan Discussed with: CRNA and Anesthesiologist  Anesthesia Plan Comments: (PAT note written 12/04/2020 by Myra Gianotti, PA-C. )      Anesthesia Quick Evaluation

## 2020-12-04 NOTE — Telephone Encounter (Signed)
-----   Message from Jacinta Shoe, PA-C sent at 12/04/2020 10:53 AM EDT ----- Regarding: PREOP CLEARANCE - OR 12/09/20 Request for surgical clearance:  1.What type of surgery is being performed? Excision of lipoma left thigh  2.When is this surgery scheduled? 12/09/2020  3.What type of clearance is required (medical clearance vs. Pharmacy clearance to hold med vs. Both)? Both, but pharmacist is already working on anticoagulation instructions  4.Are there any medications that need to be held prior to surgery and how long? Pharmacist is already addressing  5.Practice name and name of physician performing surgery? Meridee Score, MD  6.What is the office phone number? [phone] 7. What is the office fax number? [phone]  8. Anesthesia type (None, local, MAC, general) ? Choice. May require require general anesthesia.   ZELENAK,ALLISON W. 12/04/2020, 10:47 AM

## 2020-12-07 ENCOUNTER — Other Ambulatory Visit: Payer: Self-pay | Admitting: Physician Assistant

## 2020-12-08 ENCOUNTER — Encounter (HOSPITAL_COMMUNITY): Payer: Self-pay | Admitting: Orthopedic Surgery

## 2020-12-09 ENCOUNTER — Other Ambulatory Visit: Payer: Self-pay

## 2020-12-09 ENCOUNTER — Ambulatory Visit (HOSPITAL_COMMUNITY)
Admission: RE | Admit: 2020-12-09 | Discharge: 2020-12-09 | Disposition: A | Discharge status: Home / Self Care | Payer: Medicare Other | Attending: Orthopedic Surgery | Admitting: Orthopedic Surgery

## 2020-12-09 ENCOUNTER — Encounter (HOSPITAL_COMMUNITY): Payer: Self-pay | Admitting: Orthopedic Surgery

## 2020-12-09 ENCOUNTER — Ambulatory Visit (HOSPITAL_COMMUNITY): Payer: Medicare Other | Admitting: Vascular Surgery

## 2020-12-09 ENCOUNTER — Encounter (HOSPITAL_COMMUNITY)
Admission: RE | Disposition: A | Discharge status: Home / Self Care | Payer: Self-pay | Source: Home / Self Care | Attending: Orthopedic Surgery

## 2020-12-09 ENCOUNTER — Ambulatory Visit (HOSPITAL_COMMUNITY): Payer: Medicare Other | Admitting: Physician Assistant

## 2020-12-09 DIAGNOSIS — S7012XA Contusion of left thigh, initial encounter: Secondary | ICD-10-CM | POA: Insufficient documentation

## 2020-12-09 DIAGNOSIS — Z833 Family history of diabetes mellitus: Secondary | ICD-10-CM | POA: Diagnosis not present

## 2020-12-09 DIAGNOSIS — Z7901 Long term (current) use of anticoagulants: Secondary | ICD-10-CM | POA: Insufficient documentation

## 2020-12-09 DIAGNOSIS — Z7982 Long term (current) use of aspirin: Secondary | ICD-10-CM | POA: Diagnosis not present

## 2020-12-09 DIAGNOSIS — X58XXXA Exposure to other specified factors, initial encounter: Secondary | ICD-10-CM | POA: Insufficient documentation

## 2020-12-09 DIAGNOSIS — E669 Obesity, unspecified: Secondary | ICD-10-CM | POA: Diagnosis not present

## 2020-12-09 DIAGNOSIS — D1724 Benign lipomatous neoplasm of skin and subcutaneous tissue of left leg: Secondary | ICD-10-CM | POA: Diagnosis not present

## 2020-12-09 DIAGNOSIS — Z86718 Personal history of other venous thrombosis and embolism: Secondary | ICD-10-CM | POA: Insufficient documentation

## 2020-12-09 DIAGNOSIS — D2122 Benign neoplasm of connective and other soft tissue of left lower limb, including hip: Secondary | ICD-10-CM | POA: Diagnosis not present

## 2020-12-09 DIAGNOSIS — I5032 Chronic diastolic (congestive) heart failure: Secondary | ICD-10-CM | POA: Insufficient documentation

## 2020-12-09 DIAGNOSIS — Z6839 Body mass index (BMI) 39.0-39.9, adult: Secondary | ICD-10-CM | POA: Insufficient documentation

## 2020-12-09 DIAGNOSIS — Z79899 Other long term (current) drug therapy: Secondary | ICD-10-CM | POA: Diagnosis not present

## 2020-12-09 DIAGNOSIS — Z8249 Family history of ischemic heart disease and other diseases of the circulatory system: Secondary | ICD-10-CM | POA: Insufficient documentation

## 2020-12-09 DIAGNOSIS — I5023 Acute on chronic systolic (congestive) heart failure: Secondary | ICD-10-CM | POA: Diagnosis not present

## 2020-12-09 DIAGNOSIS — Z86711 Personal history of pulmonary embolism: Secondary | ICD-10-CM | POA: Diagnosis not present

## 2020-12-09 DIAGNOSIS — Z888 Allergy status to other drugs, medicaments and biological substances status: Secondary | ICD-10-CM | POA: Insufficient documentation

## 2020-12-09 DIAGNOSIS — I110 Hypertensive heart disease with heart failure: Secondary | ICD-10-CM | POA: Diagnosis not present

## 2020-12-09 DIAGNOSIS — I4821 Permanent atrial fibrillation: Secondary | ICD-10-CM | POA: Diagnosis not present

## 2020-12-09 HISTORY — PX: LIPOMA EXCISION: SHX5283

## 2020-12-09 HISTORY — PX: APPLICATION OF WOUND VAC: SHX5189

## 2020-12-09 LAB — PLATELET COUNT: Platelets: 186 10*3/uL (ref 150–400)

## 2020-12-09 LAB — PROTIME-INR
INR: 1.1 (ref 0.8–1.2)
Prothrombin Time: 14.6 seconds (ref 11.4–15.2)

## 2020-12-09 SURGERY — EXCISION LIPOMA
Anesthesia: General | Laterality: Left

## 2020-12-09 MED ORDER — FENTANYL CITRATE (PF) 250 MCG/5ML IJ SOLN
INTRAMUSCULAR | Status: AC
  Filled 2020-12-09: qty 5

## 2020-12-09 MED ORDER — ONDANSETRON HCL 4 MG/2ML IJ SOLN: 4.0000 mg | Freq: Once | INTRAMUSCULAR | Status: DC | PRN

## 2020-12-09 MED ORDER — FENTANYL CITRATE (PF) 100 MCG/2ML IJ SOLN
INTRAMUSCULAR | Status: DC | PRN
  Administered 2020-12-09: 25 ug via INTRAVENOUS
  Administered 2020-12-09: 50 ug via INTRAVENOUS

## 2020-12-09 MED ORDER — ONDANSETRON HCL 4 MG/2ML IJ SOLN
INTRAMUSCULAR | Status: DC | PRN
  Administered 2020-12-09: 4 mg via INTRAVENOUS

## 2020-12-09 MED ORDER — PHENYLEPHRINE 40 MCG/ML (10ML) SYRINGE FOR IV PUSH (FOR BLOOD PRESSURE SUPPORT)
PREFILLED_SYRINGE | INTRAVENOUS | Status: DC | PRN
  Administered 2020-12-09: 80 ug via INTRAVENOUS
  Administered 2020-12-09 (×2): 40 ug via INTRAVENOUS

## 2020-12-09 MED ORDER — OXYCODONE HCL 5 MG PO TABS: 5.0000 mg | ORAL_TABLET | Freq: Once | ORAL | Status: DC | PRN

## 2020-12-09 MED ORDER — PROPOFOL 10 MG/ML IV BOLUS
INTRAVENOUS | Status: AC
  Filled 2020-12-09: qty 20

## 2020-12-09 MED ORDER — HYDROCODONE-ACETAMINOPHEN 5-325 MG PO TABS: 1.0000 | ORAL_TABLET | ORAL | 0 refills | Status: DC | PRN

## 2020-12-09 MED ORDER — MIDAZOLAM HCL 2 MG/2ML IJ SOLN
INTRAMUSCULAR | Status: AC
  Filled 2020-12-09: qty 2

## 2020-12-09 MED ORDER — CEFAZOLIN IN SODIUM CHLORIDE 3-0.9 GM/100ML-% IV SOLN
3.0000 g | INTRAVENOUS | Status: DC
  Filled 2020-12-09: qty 100

## 2020-12-09 MED ORDER — CEFAZOLIN SODIUM-DEXTROSE 2-3 GM-%(50ML) IV SOLR
INTRAVENOUS | Status: DC | PRN
  Administered 2020-12-09: 2 g via INTRAVENOUS

## 2020-12-09 MED ORDER — FENTANYL CITRATE (PF) 100 MCG/2ML IJ SOLN: 25.0000 ug | INTRAMUSCULAR | Status: DC | PRN

## 2020-12-09 MED ORDER — MIDAZOLAM HCL 5 MG/5ML IJ SOLN
INTRAMUSCULAR | Status: DC | PRN
  Administered 2020-12-09: 1 mg via INTRAVENOUS

## 2020-12-09 MED ORDER — ONDANSETRON HCL 4 MG/2ML IJ SOLN
INTRAMUSCULAR | Status: AC
  Filled 2020-12-09: qty 2

## 2020-12-09 MED ORDER — ORAL CARE MOUTH RINSE: 15.0000 mL | Freq: Once | OROMUCOSAL | Status: AC

## 2020-12-09 MED ORDER — CHLORHEXIDINE GLUCONATE 0.12 % MT SOLN
15.0000 mL | Freq: Once | OROMUCOSAL | Status: AC
  Administered 2020-12-09: 15 mL via OROMUCOSAL
  Filled 2020-12-09: qty 15

## 2020-12-09 MED ORDER — LACTATED RINGERS IV SOLN: INTRAVENOUS | Status: DC

## 2020-12-09 MED ORDER — BUPIVACAINE HCL (PF) 0.25 % IJ SOLN
INTRAMUSCULAR | Status: AC
  Filled 2020-12-09: qty 30

## 2020-12-09 MED ORDER — DEXAMETHASONE SODIUM PHOSPHATE 10 MG/ML IJ SOLN
INTRAMUSCULAR | Status: DC | PRN
  Administered 2020-12-09: 5 mg via INTRAVENOUS

## 2020-12-09 MED ORDER — LIDOCAINE 2% (20 MG/ML) 5 ML SYRINGE
INTRAMUSCULAR | Status: AC
  Filled 2020-12-09: qty 5

## 2020-12-09 MED ORDER — DEXAMETHASONE SODIUM PHOSPHATE 10 MG/ML IJ SOLN
INTRAMUSCULAR | Status: AC
  Filled 2020-12-09: qty 1

## 2020-12-09 MED ORDER — 0.9 % SODIUM CHLORIDE (POUR BTL) OPTIME
TOPICAL | Status: DC | PRN
  Administered 2020-12-09: 1000 mL

## 2020-12-09 MED ORDER — PROPOFOL 10 MG/ML IV BOLUS
INTRAVENOUS | Status: DC | PRN
  Administered 2020-12-09: 120 mg via INTRAVENOUS

## 2020-12-09 MED ORDER — LIDOCAINE 2% (20 MG/ML) 5 ML SYRINGE
INTRAMUSCULAR | Status: DC | PRN
  Administered 2020-12-09: 60 mg via INTRAVENOUS

## 2020-12-09 MED ORDER — OXYCODONE HCL 5 MG/5ML PO SOLN: 5.0000 mg | Freq: Once | ORAL | Status: DC | PRN

## 2020-12-09 MED ORDER — BUPIVACAINE HCL (PF) 0.25 % IJ SOLN
INTRAMUSCULAR | Status: DC | PRN
  Administered 2020-12-09: 30 mL

## 2020-12-09 SURGICAL SUPPLY — 57 items
ADH SKN CLS APL DERMABOND .7 (GAUZE/BANDAGES/DRESSINGS) ×2
BAG COUNTER SPONGE SURGICOUNT (BAG) ×3 IMPLANT
BAG SPNG CNTER NS LX DISP (BAG) ×2
BLADE AVERAGE 25X9 (BLADE) IMPLANT
BLADE MINI RND TIP GREEN BEAV (BLADE) IMPLANT
BNDG CMPR 9X4 STRL LF SNTH (GAUZE/BANDAGES/DRESSINGS)
BNDG COHESIVE 1X5 TAN STRL LF (GAUZE/BANDAGES/DRESSINGS) IMPLANT
BNDG COHESIVE 6X5 TAN STRL LF (GAUZE/BANDAGES/DRESSINGS) IMPLANT
BNDG ESMARK 4X9 LF (GAUZE/BANDAGES/DRESSINGS) ×2 IMPLANT
BNDG GAUZE ELAST 4 BULKY (GAUZE/BANDAGES/DRESSINGS) IMPLANT
CORD BIPOLAR FORCEPS 12FT (ELECTRODE) ×2 IMPLANT
COTTON STERILE ROLL (GAUZE/BANDAGES/DRESSINGS) IMPLANT
COVER SURGICAL LIGHT HANDLE (MISCELLANEOUS) ×6 IMPLANT
CUFF TOURN SGL QUICK 18X4 (TOURNIQUET CUFF) IMPLANT
CUFF TOURN SGL QUICK 24 (TOURNIQUET CUFF)
CUFF TRNQT CYL 24X4X16.5-23 (TOURNIQUET CUFF) IMPLANT
DERMABOND ADVANCED (GAUZE/BANDAGES/DRESSINGS) ×1
DERMABOND ADVANCED .7 DNX12 (GAUZE/BANDAGES/DRESSINGS) IMPLANT
DRAIN PENROSE 1/2X12 LTX STRL (WOUND CARE) ×1 IMPLANT
DRAPE DERMATAC (DRAPES) ×3 IMPLANT
DRAPE OEC MINIVIEW 54X84 (DRAPES) IMPLANT
DRAPE U-SHAPE 47X51 STRL (DRAPES) ×3 IMPLANT
DRESSING PEEL AND PLAC PRVNA20 (GAUZE/BANDAGES/DRESSINGS) IMPLANT
DRSG ADAPTIC 3X8 NADH LF (GAUZE/BANDAGES/DRESSINGS) IMPLANT
DRSG PEEL AND PLACE PREVENA 20 (GAUZE/BANDAGES/DRESSINGS) ×3
DURAPREP 26ML APPLICATOR (WOUND CARE) ×3 IMPLANT
ELECT REM PT RETURN 9FT ADLT (ELECTROSURGICAL) ×3
ELECTRODE REM PT RTRN 9FT ADLT (ELECTROSURGICAL) ×2 IMPLANT
GAUZE SPONGE 2X2 8PLY STRL LF (GAUZE/BANDAGES/DRESSINGS) IMPLANT
GAUZE SPONGE 4X4 12PLY STRL (GAUZE/BANDAGES/DRESSINGS) IMPLANT
GLOVE SURG ORTHO LTX SZ9 (GLOVE) ×3 IMPLANT
GLOVE SURG UNDER POLY LF SZ9 (GLOVE) ×3 IMPLANT
GOWN STRL REUS W/ TWL XL LVL3 (GOWN DISPOSABLE) ×4 IMPLANT
GOWN STRL REUS W/TWL XL LVL3 (GOWN DISPOSABLE) ×6
KIT BASIN OR (CUSTOM PROCEDURE TRAY) ×3 IMPLANT
KIT DRSG PREVENA PLUS 7DAY 125 (MISCELLANEOUS) ×1 IMPLANT
KIT TURNOVER KIT B (KITS) ×3 IMPLANT
MANIFOLD NEPTUNE II (INSTRUMENTS) ×3 IMPLANT
NDL HYPO 25GX1X1/2 BEV (NEEDLE) IMPLANT
NEEDLE HYPO 25GX1X1/2 BEV (NEEDLE) IMPLANT
NS IRRIG 1000ML POUR BTL (IV SOLUTION) ×3 IMPLANT
PACK ORTHO EXTREMITY (CUSTOM PROCEDURE TRAY) ×3 IMPLANT
PAD ARMBOARD 7.5X6 YLW CONV (MISCELLANEOUS) ×6 IMPLANT
PAD CAST 4YDX4 CTTN HI CHSV (CAST SUPPLIES) IMPLANT
PADDING CAST COTTON 4X4 STRL (CAST SUPPLIES)
SPECIMEN JAR SMALL (MISCELLANEOUS) ×3 IMPLANT
SPONGE GAUZE 2X2 STER 10/PKG (GAUZE/BANDAGES/DRESSINGS)
SUCTION FRAZIER HANDLE 10FR (MISCELLANEOUS)
SUCTION TUBE FRAZIER 10FR DISP (MISCELLANEOUS) IMPLANT
SUT ETHILON 2 0 FS 18 (SUTURE) IMPLANT
SUT ETHILON 2 0 PSLX (SUTURE) ×4 IMPLANT
SUT VIC AB 2-0 FS1 27 (SUTURE) IMPLANT
SYR CONTROL 10ML LL (SYRINGE) IMPLANT
TOWEL GREEN STERILE (TOWEL DISPOSABLE) ×3 IMPLANT
TOWEL GREEN STERILE FF (TOWEL DISPOSABLE) ×3 IMPLANT
TUBE CONNECTING 12X1/4 (SUCTIONS) IMPLANT
WATER STERILE IRR 1000ML POUR (IV SOLUTION) ×2 IMPLANT

## 2020-12-09 NOTE — Transfer of Care (Signed)
Immediate Anesthesia Transfer of Care Note  Patient: [name]  Procedure(s) Performed: EXCISION LIPOMA LEFT THIGH (Left) APPLICATION OF WOUND VAC  Patient Location: PACU  Anesthesia Type:General  Level of Consciousness: awake, alert  and oriented  Airway & Oxygen Therapy: Patient Spontanous Breathing  Post-op Assessment: Report given to RN and Post -op Vital signs reviewed and stable  Post vital signs: Reviewed and stable  Last Vitals:  Vitals Value Taken Time  BP 167/79 12/09/20 0945  Temp 36.8 C 12/09/20 0943  Pulse 46 12/09/20 0947  Resp 14 12/09/20 0947  SpO2 100 % 12/09/20 0947  Vitals shown include unvalidated device data.  Last Pain:  Vitals:   12/09/20 0709  TempSrc:   PainSc: 0-No pain         Complications: No notable events documented.

## 2020-12-09 NOTE — Anesthesia Postprocedure Evaluation (Signed)
Anesthesia Post Note  Patient: [name]  Procedure(s) Performed: EXCISION LIPOMA LEFT THIGH (Left) APPLICATION OF WOUND VAC     Patient location during evaluation: PACU Anesthesia Type: General Level of consciousness: awake and alert and oriented Pain management: pain level controlled Vital Signs Assessment: post-procedure vital signs reviewed and stable Respiratory status: spontaneous breathing, nonlabored ventilation and respiratory function stable Cardiovascular status: blood pressure returned to baseline and stable Postop Assessment: no apparent nausea or vomiting Anesthetic complications: no   No notable events documented.  Last Vitals:  Vitals:   12/09/20 1000 12/09/20 1015  BP: (!) 149/84 (!) 143/79  Pulse: 99 88  Resp: 16 15  Temp:    SpO2: 96% 96%    Last Pain:  Vitals:   12/09/20 1015  TempSrc:   PainSc: 0-No pain                 Laycie Schriner A.

## 2020-12-09 NOTE — Anesthesia Procedure Notes (Signed)
Procedure Name: LMA Insertion Date/Time: 12/09/2020 8:55 AM Performed by: Gwyndolyn Saxon, CRNA Pre-anesthesia Checklist: Patient identified, Emergency Drugs available, Suction available and Patient being monitored Patient Re-evaluated:Patient Re-evaluated prior to induction Oxygen Delivery Method: Circle system utilized Preoxygenation: Pre-oxygenation with 100% oxygen Induction Type: IV induction Ventilation: Mask ventilation without difficulty LMA: LMA inserted LMA Size: 4.0 Number of attempts: 1 Placement Confirmation: positive ETCO2 and breath sounds checked- equal and bilateral Tube secured with: Tape Dental Injury: Teeth and Oropharynx as per pre-operative assessment

## 2020-12-09 NOTE — H&P (Signed)
[name] is an 75 y.o. female.   Chief Complaint: left thigh mass HPI:  Patient is a 75 year old woman who is seen for evaluation for a left thigh soft tissue mass she is status post MRI scan.  Patient is currently in a wheelchair for pubic rami fractures.  Patient has had a DVT in the past and is currently on Coumadin.   Past Medical History:  Diagnosis Date   (HFpEF) heart failure with preserved ejection fraction ([name])    a. 06/2008 Echo: EF 55-60%, mild LVH, triv AI, mild to mod MS, mod to sev dil LA, mildly dil RA.   Acute right MCA stroke ([name])    a. [id number] Embolic stroke treated with TPA and thrombectomy with hemorrhagic transformation; Carotids 3/12 negative for ICA stenosis.   Arthritis    Bilateral Pulmonary Emboli    a. 08/2005 - s/p IVC filter.   CHF (congestive heart failure) (HCC)    Depression    Embolus of femoral artery ([name])    a. 06/2008 s/p bilateral embolectomies.   History of DVT (deep vein thrombosis)    a. s/p IVC filter.   HIT (heparin-induced thrombocytopenia) (HCC)    HTN (hypertension)    Lupus anticoagulant disorder (HCC)    Mitral stenosis    moderate-severe 09/04/20 echo   Obesity, unspecified    Permanent atrial fibrillation ([name])    a. On chronic Coumadin - INRs followed by PCP; b. CHA2DS2VASc = 5.   Popliteal artery embolism, right ([place])    a. 01/2017 in the setting of subRx INR-->s/p embolectomy.    Past Surgical History:  Procedure Laterality Date   distal radius fracture. (12,/16/2010)     EMBOLECTOMY Right 02/15/2017   Procedure: EMBOLECTOMY RIGHT LEG;  Surgeon: Melody Dutch, MD;  Location: [facility] OR;  Service: Vascular;  Laterality: Right;   EYE SURGERY Bilateral 2021   cataracts removed   I & D EXTREMITY Right 03/05/2017   Procedure: IRRIGATION AND DEBRIDEMENT RIGHT LEG HEMATOMA EVACUATION;  Surgeon: Melody Dutch, MD;  Location: [facility] OR;  Service: Vascular;  Laterality: Right;   IVC Placement 08/30/2005     Open reduction  and internal fixation of left intra-articular      Family History  Problem Relation Age of Onset   Coronary artery disease Other    Diabetes Other    Social History:  reports that she has never smoked. She has never used smokeless tobacco. She reports that she does not drink alcohol and does not use drugs.  Allergies:  Allergies  Allergen Reactions   Heparin Hives    Patient attests severe allergy- rash all over, severe itching, unable to take heparin   Other Itching and Rash    "Sheets and Linens used by [name]"    Medications Prior to Admission  Medication Sig Dispense Refill   acetaminophen (TYLENOL) 500 MG tablet Take 500 mg by mouth every 6 (six) hours as needed for mild pain.     aspirin 81 MG tablet Take 81 mg by mouth daily.       calcitonin, salmon, (MIACALCIN/FORTICAL) 200 UNIT/ACT nasal spray Place 1 spray into alternate nostrils daily.     Calcium Carb-Cholecalciferol (CALCIUM 600 + D PO) Take 1 tablet by mouth 2 (two) times daily.     Cholecalciferol (VITAMIN D) 50 MCG (2000 UT) tablet Take 2,000 Units by mouth daily.     FLUoxetine (PROZAC) 20 MG capsule Take 20 mg by mouth daily.     furosemide (  LASIX) 40 MG tablet Take 1 tablet by mouth once daily 90 tablet 0   lidocaine (LIDODERM) 5 % Place 1 patch onto the skin daily. Remove & Discard patch within 12 hours or as directed by MD (Patient taking differently: Place 1 patch onto the skin daily as needed (pain). Remove & Discard patch within 12 hours or as directed by MD) 15 patch 0   losartan (COZAAR) 100 MG tablet Take 1 tablet by mouth once daily 90 tablet 0   methocarbamol (ROBAXIN) 500 MG tablet Take 1 tablet (500 mg total) by mouth every 6 (six) hours as needed for muscle spasms.     metoprolol tartrate (LOPRESSOR) 25 MG tablet Take 1 tablet by mouth twice daily 180 tablet 0   potassium chloride (K-DUR) 10 MEQ tablet TAKE 1 TABLET BY MOUTH ONCE DAILY 90 tablet 1   traMADol (ULTRAM) 50 MG tablet Take 1 tablet (50  mg total) by mouth every 6 (six) hours as needed for moderate pain. 10 tablet 0   warfarin (COUMADIN) 3 MG tablet TAKE 1 TO 1&1/2 TABLETS BY MOUTH DAILY AS DIRECTED BY COUMADIN CLINIC (Patient taking differently: Take 3-4.5 mg by mouth See admin instructions. Take 4.5 mg daily except on Monday take 3 mg) 55 tablet 1   enoxaparin (LOVENOX) 120 MG/0.8ML injection Inject 0.8 mLs (120 mg total) into the skin every 12 (twelve) hours. 16 mL 1   metoprolol succinate (TOPROL-XL) 50 MG 24 hr tablet Take 1 tablet (50 mg total) by mouth daily. Take with or immediately following a meal. (Patient not taking: No sig reported)      No results found for this or any previous visit (from the past 48 hour(s)). No results found.  Review of Systems  All other systems reviewed and are negative.  There were no vitals taken for this visit. Physical Exam  Patient is alert, oriented, no adenopathy, well-dressed, normal affect, normal respiratory effort. Examination patient has a firm soft tissue mass lateral to the left thigh there is no cellulitis no tenderness to palpation no skin changes.  Reviewing the MRI scan the mass is 15 x 10 x 10 cm smoothly marginated well-circumscribed likely an encapsulated mass in the subcutaneous fat.  No bone changes.  There are no muscle lesions. Heart RRR Lungs Clear Assessment/Plan 1. Mass of soft tissue of thigh       Plan: We will plan for an excisional biopsy of the soft tissue mass.  Recommended holding the Coumadin 5 days before surgery.  Bevely Palmer Rejoice Heatwole, PA 12/09/2020, 6:32 AM

## 2020-12-09 NOTE — Op Note (Addendum)
12/09/2020  9:49 AM  PATIENT:  [name]    PRE-OPERATIVE DIAGNOSIS:  Lipoma mass Left Thigh  POST-OPERATIVE DIAGNOSIS: Necrotic soft tissue mass left thigh  PROCEDURE:  EXCISION SOFT TISSUE MASS LEFT THIGH,  APPLICATION OF WOUND VAC Local tissue rearrangement for wound closure 23 x 9 cm. Placement of Penrose drain. Tissue sent to pathology.   SURGEON:  [name], MD  PHYSICIAN ASSISTANT:None ANESTHESIA:   General  PREOPERATIVE INDICATIONS:  [name] is a  75 y.o. female with a diagnosis of Lipoma mass Left Thigh who failed conservative measures and elected for surgical management.    The risks benefits and alternatives were discussed with the patient preoperatively including but not limited to the risks of infection, bleeding, nerve injury, cardiopulmonary complications, the need for revision surgery, among others, and the patient was willing to proceed.  OPERATIVE IMPLANTS: Penrose drain and 20 cm Prevena wound VAC.  '@ENCIMAGES'$ @  OPERATIVE FINDINGS: Patient had a large necrotic soft tissue tumor approximately 15 cm in diameter.  This was noninvasive superficial to the fascia.  Tissue sent for pathology.  OPERATIVE PROCEDURE: Patient was brought the operating room and underwent a general anesthetic.  After adequate levels anesthesia were obtained patient's left lower extremity was prepped using DuraPrep draped into a sterile field a timeout was called.  Elliptical incision was made around the ulcerative skin and a 9 x 23 cm incision was made.  Dissection was carried down around the soft tissue mass which had hematoma and necrotic tissue this was noninvasive.  Electrocardio was used hemostasis the wound was irrigated with normal saline.  Local tissue rearrangement was used to close the wound that was 9 x 23 cm.  A 20 cm Prevena wound VAC was applied this was outlined with derma tack this had a good suction fit patient was extubated taken the PACU in stable condition.   The wound edges were locally infiltrated with 20 cc of quarter percent Marcaine plain.  Debridement type: Excisional Debridement  Side: left  Body Location: thigh   Tools used for debridement: scalpel and rongeur  Pre-debridement Wound size (cm):   Length: 0        Width: 0     Depth: 0   Post-debridement Wound size (cm):   Length: 23        Width: 9     Depth: 5   Debridement depth beyond dead/damaged tissue down to healthy viable tissue: yes  Tissue layer involved: skin, subcutaneous tissue  Nature of tissue removed: Slough, Necrotic, Devitalized Tissue, and Non-viable tissue  Irrigation volume: 1 liter     Irrigation fluid type: Normal Saline     DISCHARGE PLANNING:  Antibiotic duration: Preoperative antibiotics 3 g Kefzol  Weightbearing: Weightbearing as tolerated  Pain medication: Prescription for Vicodin  Dressing care/ Wound VAC: Continue wound VAC for 1 week  Ambulatory devices: Walker or crutches as needed  Discharge to: Home.  Follow-up: In the office 1 week post operative.

## 2020-12-09 NOTE — Interval H&P Note (Signed)
History and Physical Interval Note:  12/09/2020 6:38 AM  [name]  has presented today for surgery, with the diagnosis of Lipoma mass Left Thigh.  The various methods of treatment have been discussed with the patient and family. After consideration of risks, benefits and other options for treatment, the patient has consented to  Procedure(s): EXCISION LIPOMA LEFT THIGH (Left) as a surgical intervention.  The patient's history has been reviewed, patient examined, no change in status, stable for surgery.  I have reviewed the patient's chart and labs.  Questions were answered to the patient's satisfaction.     [name]

## 2020-12-10 ENCOUNTER — Encounter (HOSPITAL_COMMUNITY): Payer: Self-pay | Admitting: Orthopedic Surgery

## 2020-12-10 ENCOUNTER — Ambulatory Visit (INDEPENDENT_AMBULATORY_CARE_PROVIDER_SITE_OTHER): Payer: Medicare Other | Admitting: Orthopedic Surgery

## 2020-12-10 DIAGNOSIS — M7989 Other specified soft tissue disorders: Secondary | ICD-10-CM

## 2020-12-10 NOTE — Progress Notes (Signed)
Patient presents in follow-up status post excision of a large soft tissue mass left thigh.  Mass was sent to pathology for identification.  Patient has filled her 150 cc wound VAC canister with serosanguineous blood.  The canister was changed she will follow-up on Wednesday.

## 2020-12-11 ENCOUNTER — Ambulatory Visit (INDEPENDENT_AMBULATORY_CARE_PROVIDER_SITE_OTHER): Payer: Medicare Other

## 2020-12-11 DIAGNOSIS — M5136 Other intervertebral disc degeneration, lumbar region: Secondary | ICD-10-CM | POA: Diagnosis not present

## 2020-12-11 DIAGNOSIS — I5022 Chronic systolic (congestive) heart failure: Secondary | ICD-10-CM | POA: Diagnosis not present

## 2020-12-11 DIAGNOSIS — I739 Peripheral vascular disease, unspecified: Secondary | ICD-10-CM | POA: Diagnosis not present

## 2020-12-11 DIAGNOSIS — Z5181 Encounter for therapeutic drug level monitoring: Secondary | ICD-10-CM

## 2020-12-11 DIAGNOSIS — I4821 Permanent atrial fibrillation: Secondary | ICD-10-CM | POA: Diagnosis not present

## 2020-12-11 DIAGNOSIS — I050 Rheumatic mitral stenosis: Secondary | ICD-10-CM | POA: Diagnosis not present

## 2020-12-11 DIAGNOSIS — I110 Hypertensive heart disease with heart failure: Secondary | ICD-10-CM | POA: Diagnosis not present

## 2020-12-11 LAB — POCT INR: INR: 1.3 — AB (ref 2.0–3.0)

## 2020-12-11 NOTE — Patient Instructions (Signed)
Description   Spoke with Musician with [facility] instructed to have pt take 2 tablets today and 2 tablets tomorrow and then continue taking Warfarin 1.5 tablets daily except for 2 tablets on Wednesdays. Continue Lovenox injections. Recheck next Wednesday 12/16/20. Coumadin Clinic # [phone]

## 2020-12-14 ENCOUNTER — Inpatient Hospital Stay (HOSPITAL_COMMUNITY)
Admission: EM | Admit: 2020-12-14 | Discharge: 2020-12-23 | DRG: 920 | Disposition: A | Discharge status: Home Health | Payer: Medicare Other | Attending: Internal Medicine | Admitting: Internal Medicine

## 2020-12-14 ENCOUNTER — Other Ambulatory Visit: Payer: Self-pay

## 2020-12-14 ENCOUNTER — Encounter (HOSPITAL_COMMUNITY): Payer: Self-pay | Admitting: *Deleted

## 2020-12-14 DIAGNOSIS — R059 Cough, unspecified: Secondary | ICD-10-CM

## 2020-12-14 DIAGNOSIS — I110 Hypertensive heart disease with heart failure: Secondary | ICD-10-CM | POA: Diagnosis present

## 2020-12-14 DIAGNOSIS — Z79899 Other long term (current) drug therapy: Secondary | ICD-10-CM

## 2020-12-14 DIAGNOSIS — Z23 Encounter for immunization: Secondary | ICD-10-CM

## 2020-12-14 DIAGNOSIS — T148XXA Other injury of unspecified body region, initial encounter: Secondary | ICD-10-CM | POA: Diagnosis present

## 2020-12-14 DIAGNOSIS — Z20822 Contact with and (suspected) exposure to covid-19: Secondary | ICD-10-CM | POA: Diagnosis present

## 2020-12-14 DIAGNOSIS — R0902 Hypoxemia: Secondary | ICD-10-CM | POA: Diagnosis not present

## 2020-12-14 DIAGNOSIS — I4821 Permanent atrial fibrillation: Secondary | ICD-10-CM | POA: Diagnosis present

## 2020-12-14 DIAGNOSIS — I10 Essential (primary) hypertension: Secondary | ICD-10-CM | POA: Diagnosis present

## 2020-12-14 DIAGNOSIS — M1990 Unspecified osteoarthritis, unspecified site: Secondary | ICD-10-CM | POA: Diagnosis present

## 2020-12-14 DIAGNOSIS — Z8249 Family history of ischemic heart disease and other diseases of the circulatory system: Secondary | ICD-10-CM

## 2020-12-14 DIAGNOSIS — Z8673 Personal history of transient ischemic attack (TIA), and cerebral infarction without residual deficits: Secondary | ICD-10-CM

## 2020-12-14 DIAGNOSIS — D6862 Lupus anticoagulant syndrome: Secondary | ICD-10-CM | POA: Diagnosis present

## 2020-12-14 DIAGNOSIS — L7622 Postprocedural hemorrhage and hematoma of skin and subcutaneous tissue following other procedure: Secondary | ICD-10-CM | POA: Diagnosis not present

## 2020-12-14 DIAGNOSIS — Z833 Family history of diabetes mellitus: Secondary | ICD-10-CM

## 2020-12-14 DIAGNOSIS — I951 Orthostatic hypotension: Secondary | ICD-10-CM | POA: Diagnosis present

## 2020-12-14 DIAGNOSIS — Z6841 Body Mass Index (BMI) 40.0 and over, adult: Secondary | ICD-10-CM

## 2020-12-14 DIAGNOSIS — Z86711 Personal history of pulmonary embolism: Secondary | ICD-10-CM

## 2020-12-14 DIAGNOSIS — I5032 Chronic diastolic (congestive) heart failure: Secondary | ICD-10-CM | POA: Diagnosis present

## 2020-12-14 DIAGNOSIS — T8131XA Disruption of external operation (surgical) wound, not elsewhere classified, initial encounter: Principal | ICD-10-CM | POA: Diagnosis present

## 2020-12-14 DIAGNOSIS — R6889 Other general symptoms and signs: Secondary | ICD-10-CM | POA: Diagnosis not present

## 2020-12-14 DIAGNOSIS — R58 Hemorrhage, not elsewhere classified: Secondary | ICD-10-CM | POA: Diagnosis not present

## 2020-12-14 DIAGNOSIS — D649 Anemia, unspecified: Secondary | ICD-10-CM | POA: Diagnosis not present

## 2020-12-14 DIAGNOSIS — D62 Acute posthemorrhagic anemia: Secondary | ICD-10-CM | POA: Diagnosis present

## 2020-12-14 DIAGNOSIS — Z7982 Long term (current) use of aspirin: Secondary | ICD-10-CM

## 2020-12-14 DIAGNOSIS — I509 Heart failure, unspecified: Secondary | ICD-10-CM

## 2020-12-14 DIAGNOSIS — E669 Obesity, unspecified: Secondary | ICD-10-CM | POA: Diagnosis present

## 2020-12-14 DIAGNOSIS — Z95828 Presence of other vascular implants and grafts: Secondary | ICD-10-CM

## 2020-12-14 DIAGNOSIS — Z86718 Personal history of other venous thrombosis and embolism: Secondary | ICD-10-CM

## 2020-12-14 DIAGNOSIS — Z743 Need for continuous supervision: Secondary | ICD-10-CM | POA: Diagnosis not present

## 2020-12-14 DIAGNOSIS — Z888 Allergy status to other drugs, medicaments and biological substances status: Secondary | ICD-10-CM

## 2020-12-14 DIAGNOSIS — Z7901 Long term (current) use of anticoagulants: Secondary | ICD-10-CM | POA: Diagnosis not present

## 2020-12-14 DIAGNOSIS — Y793 Surgical instruments, materials and orthopedic devices (including sutures) associated with adverse incidents: Secondary | ICD-10-CM | POA: Diagnosis present

## 2020-12-14 LAB — PROTIME-INR
INR: 1.7 — ABNORMAL HIGH (ref 0.8–1.2)
Prothrombin Time: 19.9 seconds — ABNORMAL HIGH (ref 11.4–15.2)

## 2020-12-14 LAB — CBC WITH DIFFERENTIAL/PLATELET
Abs Immature Granulocytes: 0.04 10*3/uL (ref 0.00–0.07)
Basophils Absolute: 0.1 10*3/uL (ref 0.0–0.1)
Basophils Relative: 1 %
Eosinophils Absolute: 0.1 10*3/uL (ref 0.0–0.5)
Eosinophils Relative: 1 %
HCT: 25.3 % — ABNORMAL LOW (ref 36.0–46.0)
Hemoglobin: 7.8 g/dL — ABNORMAL LOW (ref 12.0–15.0)
Immature Granulocytes: 0 %
Lymphocytes Relative: 10 %
Lymphs Abs: 1 10*3/uL (ref 0.7–4.0)
MCH: 32.8 pg (ref 26.0–34.0)
MCHC: 30.8 g/dL (ref 30.0–36.0)
MCV: 106.3 fL — ABNORMAL HIGH (ref 80.0–100.0)
Monocytes Absolute: 1.2 10*3/uL — ABNORMAL HIGH (ref 0.1–1.0)
Monocytes Relative: 13 %
Neutro Abs: 7.4 10*3/uL (ref 1.7–7.7)
Neutrophils Relative %: 75 %
Platelets: 184 10*3/uL (ref 150–400)
RBC: 2.38 MIL/uL — ABNORMAL LOW (ref 3.87–5.11)
RDW: 13.8 % (ref 11.5–15.5)
WBC: 9.9 10*3/uL (ref 4.0–10.5)
nRBC: 0 % (ref 0.0–0.2)

## 2020-12-14 LAB — PREPARE RBC (CROSSMATCH)

## 2020-12-14 LAB — BASIC METABOLIC PANEL
Anion gap: 8 (ref 5–15)
BUN: 12 mg/dL (ref 8–23)
CO2: 25 mmol/L (ref 22–32)
Calcium: 8.1 mg/dL — ABNORMAL LOW (ref 8.9–10.3)
Chloride: 101 mmol/L (ref 98–111)
Creatinine, Ser: 1.03 mg/dL — ABNORMAL HIGH (ref 0.44–1.00)
GFR, Estimated: 57 mL/min — ABNORMAL LOW (ref >60)
Glucose, Bld: 201 mg/dL — ABNORMAL HIGH (ref 70–99)
Potassium: 4.1 mmol/L (ref 3.5–5.1)
Sodium: 134 mmol/L — ABNORMAL LOW (ref 135–145)

## 2020-12-14 LAB — RESP PANEL BY RT-PCR (FLU A&B, COVID) ARPGX2
Influenza A by PCR: NEGATIVE
Influenza B by PCR: NEGATIVE
SARS Coronavirus 2 by RT PCR: NEGATIVE

## 2020-12-14 MED ORDER — VITAMIN D 25 MCG (1000 UNIT) PO TABS
2000.0000 [IU] | ORAL_TABLET | Freq: Every day | ORAL | Status: DC
  Administered 2020-12-15 – 2020-12-23 (×9): 2000 [IU] via ORAL
  Filled 2020-12-14 (×9): qty 2

## 2020-12-14 MED ORDER — SODIUM CHLORIDE 0.9 % IV SOLN
10.0000 mL/h | Freq: Once | INTRAVENOUS | Status: DC
Start: 2020-12-14 — End: 2020-12-23

## 2020-12-14 MED ORDER — SODIUM CHLORIDE 0.9% FLUSH: 3.0000 mL | INTRAVENOUS | Status: DC | PRN

## 2020-12-14 MED ORDER — ACETAMINOPHEN 650 MG RE SUPP: 650.0000 mg | Freq: Four times a day (QID) | RECTAL | Status: DC | PRN

## 2020-12-14 MED ORDER — CALCIUM CARBONATE-VITAMIN D 500-200 MG-UNIT PO TABS
ORAL_TABLET | Freq: Two times a day (BID) | ORAL | Status: DC
  Administered 2020-12-14 – 2020-12-23 (×18): 1 via ORAL
  Filled 2020-12-14 (×18): qty 1

## 2020-12-14 MED ORDER — METOPROLOL TARTRATE 25 MG PO TABS
25.0000 mg | ORAL_TABLET | Freq: Two times a day (BID) | ORAL | Status: DC
  Administered 2020-12-14 – 2020-12-23 (×18): 25 mg via ORAL
  Filled 2020-12-14 (×18): qty 1

## 2020-12-14 MED ORDER — HYDROCODONE-ACETAMINOPHEN 5-325 MG PO TABS
1.0000 | ORAL_TABLET | ORAL | Status: DC | PRN
  Administered 2020-12-16: 1 via ORAL
  Filled 2020-12-14: qty 1

## 2020-12-14 MED ORDER — SODIUM CHLORIDE 0.9% FLUSH
3.0000 mL | Freq: Two times a day (BID) | INTRAVENOUS | Status: DC
  Administered 2020-12-14 – 2020-12-23 (×17): 3 mL via INTRAVENOUS

## 2020-12-14 MED ORDER — CALCITONIN (SALMON) 200 UNIT/ACT NA SOLN: 1.0000 | Freq: Every day | NASAL | Status: DC

## 2020-12-14 MED ORDER — ACETAMINOPHEN 325 MG PO TABS: 650.0000 mg | ORAL_TABLET | Freq: Four times a day (QID) | ORAL | Status: DC | PRN

## 2020-12-14 MED ORDER — POTASSIUM CHLORIDE CRYS ER 10 MEQ PO TBCR
10.0000 meq | EXTENDED_RELEASE_TABLET | Freq: Every day | ORAL | Status: DC
  Administered 2020-12-15 – 2020-12-23 (×9): 10 meq via ORAL
  Filled 2020-12-14 (×10): qty 1

## 2020-12-14 MED ORDER — METHOCARBAMOL 500 MG PO TABS: 500.0000 mg | ORAL_TABLET | Freq: Four times a day (QID) | ORAL | Status: DC | PRN

## 2020-12-14 MED ORDER — FLUOXETINE HCL 20 MG PO CAPS
20.0000 mg | ORAL_CAPSULE | Freq: Every day | ORAL | Status: DC
  Administered 2020-12-15 – 2020-12-23 (×9): 20 mg via ORAL
  Filled 2020-12-14 (×9): qty 1

## 2020-12-14 MED ORDER — SODIUM CHLORIDE 0.9 % IV SOLN: 250.0000 mL | INTRAVENOUS | Status: DC | PRN

## 2020-12-14 NOTE — H&P (Addendum)
History and Physical    [name] [id number] DOB: 12-29-1945 DOA: 12/14/2020  PCP: Josetta Huddle, MD Consultants:  cardiology: Dr. Percival Spanish, vascular: Dr. Donnetta Hutching  Patient coming from:  Home - lives alone, but has been with daughter since her surgery.   Chief Complaint: bleeding from surgical site.   HPI: [name] is a 75 y.o. female with medical history significant of atrial fibrillation on Coumadin, history of PE/DVT, history of CVA, hypertension, CHF who presented to the ED with worsening bleeding of her excision site s/p large soft tissue mass excision on 12/09/20 by Dr. Sharol Given. Daughter tells me they were changing the cartridge out at 1AM and at 10:30am it started to beep again and it was full again. It was full of all blood. She knew something was wrong so called EMS.  This morning she did tell her daughter she was hot, but has not complained of dizziness of lightheadedness. No fever/chills, no N/V/D, no shortness of breath or coughing, no chest pain or palpitations. She complains that the leg is sore, but no acute worsening pain.   Had excision of a large soft tissue mass on left thigh by Dr. Sharol Given on 12/09/2020.  Wound VAC was placed and   ED Course: vitals: Afebrile, blood pressure 122/94, heart rate 126, respiratory rate 21 oxygen 100% on room air. Pertinent labs: Hemoglobin 7.8, INR 1.7, 2 units of blood ordered by EDP, ortho consulted and we were asked to admit.   Review of Systems: As per HPI; otherwise review of systems reviewed and negative.   Ambulatory Status:  currently in [facility] due to pubic ramus fractures, but has been using walker to walk at home.    Past Medical History:  Diagnosis Date   (HFpEF) heart failure with preserved ejection fraction ([place])    a. 06/2008 Echo: EF 55-60%, mild LVH, triv AI, mild to mod MS, mod to sev dil LA, mildly dil RA.   Acute right MCA stroke ([place])    a. [id number] Embolic stroke treated with TPA and thrombectomy with hemorrhagic  transformation; Carotids 3/12 negative for ICA stenosis.   Arthritis    Bilateral Pulmonary Emboli    a. 08/2005 - s/p IVC filter.   CHF (congestive heart failure) (HCC)    Depression    Embolus of femoral artery ([place])    a. 06/2008 s/p bilateral embolectomies.   History of DVT (deep vein thrombosis)    a. s/p IVC filter.   HIT (heparin-induced thrombocytopenia) (HCC)    HTN (hypertension)    Lupus anticoagulant disorder (HCC)    Mitral stenosis    moderate-severe 09/04/20 echo   Obesity, unspecified    Permanent atrial fibrillation ([place])    a. On chronic Coumadin - INRs followed by PCP; b. CHA2DS2VASc = 5.   Popliteal artery embolism, right ([place])    a. 01/2017 in the setting of subRx INR-->s/p embolectomy.    Past Surgical History:  Procedure Laterality Date   APPLICATION OF WOUND VAC  12/09/2020   Procedure: APPLICATION OF WOUND VAC;  Surgeon: [name], MD;  Location: [place];  Service: Orthopedics;;   distal radius fracture. (12,/16/2010)     EMBOLECTOMY Right 02/15/2017   Procedure: EMBOLECTOMY RIGHT LEG;  Surgeon: Elam Dutch, MD;  Location: [place];  Service: Vascular;  Laterality: Right;   EYE SURGERY Bilateral 2021   cataracts removed   I & D EXTREMITY Right 03/05/2017   Procedure: IRRIGATION AND DEBRIDEMENT RIGHT LEG HEMATOMA EVACUATION;  Surgeon: Oneida Alar,  Jessy Oto, MD;  Location: [place];  Service: Vascular;  Laterality: Right;   IVC Placement 08/30/2005     LIPOMA EXCISION Left 12/09/2020   Procedure: EXCISION LIPOMA LEFT THIGH;  Surgeon: [name], MD;  Location: [place];  Service: Orthopedics;  Laterality: Left;   Open reduction and internal fixation of left intra-articular      Social History   Socioeconomic History   Marital status: Single    Spouse name: Not on file   Number of children: Not on file   Years of education: Not on file   Highest education level: Not on file  Occupational History   Not on file  Tobacco Use   Smoking status: Never    Smokeless tobacco: Never  Vaping Use   Vaping Use: Never used  Substance and Sexual Activity   Alcohol use: No   Drug use: No   Sexual activity: Not Currently  Other Topics Concern   Not on file  Social History Narrative    Lives alone.  She works at a [employer].   Daughter in area to assist if needed.  One-level home, 5-6 steps to   entry.  Functional history prior to admission was independent,   functional status upon admission to rehab services was +2, total assist   bed mobility,+2 total assist transfers, ambulation not tested, moderate   assist upper body, total assist lower body, activities of daily living.       Social Determinants of Health   Financial Resource Strain: Not on file  Food Insecurity: Not on file  Transportation Needs: Not on file  Physical Activity: Not on file  Stress: Not on file  Social Connections: Not on file  Intimate Partner Violence: Not on file    Allergies  Allergen Reactions   Heparin Hives    Patient attests severe allergy- rash all over, severe itching, unable to take heparin   Other Itching and Rash    "Sheets and Linens used by Zacarias Pontes"    Family History  Problem Relation Age of Onset   Coronary artery disease Other    Diabetes Other     Prior to Admission medications   Medication Sig Start Date End Date Taking? Authorizing Provider  aspirin 81 MG tablet Take 81 mg by mouth daily.      [provider]  calcitonin, salmon, (MIACALCIN/FORTICAL) 200 UNIT/ACT nasal spray Place 1 spray into alternate nostrils daily. 10/09/20   [provider]  Calcium Carb-Cholecalciferol (CALCIUM 600 + D PO) Take 1 tablet by mouth 2 (two) times daily.    [provider]  Cholecalciferol (VITAMIN D) 50 MCG (2000 UT) tablet Take 2,000 Units by mouth daily.    [provider]  enoxaparin (LOVENOX) 120 MG/0.8ML injection Inject 0.8 mLs (120 mg total) into the skin every 12 (twelve) hours. 12/03/20    Minus Breeding, MD  FLUoxetine (PROZAC) 20 MG capsule Take 20 mg by mouth daily. 04/12/17   [provider]  furosemide (LASIX) 40 MG tablet Take 1 tablet by mouth once daily 11/03/20   Lendon Colonel, NP  HYDROcodone-acetaminophen (NORCO/VICODIN) 5-325 MG tablet Take 1 tablet by mouth every 4 (four) hours as needed for moderate pain. 12/09/20   Persons, Bevely Palmer, PA  lidocaine (LIDODERM) 5 % Place 1 patch onto the skin daily. Remove & Discard patch within 12 hours or as directed by MD Patient taking differently: Place 1 patch onto the skin daily as needed (pain). Remove & Discard  patch within 12 hours or as directed by MD 09/19/19   Deliah Boston, PA-C  losartan (COZAAR) 100 MG tablet Take 1 tablet by mouth once daily 10/27/20   Minus Breeding, MD  methocarbamol (ROBAXIN) 500 MG tablet Take 1 tablet (500 mg total) by mouth every 6 (six) hours as needed for muscle spasms. 09/08/20   Oswald Hillock, MD  metoprolol tartrate (LOPRESSOR) 25 MG tablet Take 1 tablet by mouth twice daily 10/27/20   Minus Breeding, MD  potassium chloride (K-DUR) 10 MEQ tablet TAKE 1 TABLET BY MOUTH ONCE DAILY 04/23/18   Erlene Quan, PA-C  warfarin (COUMADIN) 3 MG tablet TAKE 1 TO 1&1/2 TABLETS BY MOUTH DAILY AS DIRECTED BY COUMADIN CLINIC Patient taking differently: Take 3-4.5 mg by mouth See admin instructions. Take 4.5 mg daily except on Monday take 3 mg 10/27/20   Minus Breeding, MD    Physical Exam: Vitals:   12/14/20 1234 12/14/20 1619 12/14/20 1630 12/14/20 1640  BP:  119/73 127/74 (!) 124/98  Pulse:  (!) 101 (!) 101 (!) 102  Resp:  '19 14 16  '$ Temp:  (!) 97.4 F (36.3 C)  98.5 F (36.9 C)  TempSrc:  Tympanic  Oral  SpO2:  100% 100% 100%  Weight: 110.2 kg     Height: '5\' 5"'$  (1.651 m)        General:  Appears calm and comfortable and is in NAD Eyes:  PERRL, EOMI, normal lids, iris ENT:  grossly normal hearing, lips & tongue, mmm; appropriate dentition Neck:  no LAD, masses or thyromegaly; no  carotid bruits Cardiovascular:  irregularly, irregular, no m/r/g. No LE edema.  Respiratory:   CTA bilaterally with no wheezes/rales/rhonchi.  Normal respiratory effort. Abdomen:  soft, NT, ND, NABS Back:   normal alignment, no CVAT Skin:  no rash or induration seen on limited exam. Left lateral thigh with excision that is not actively bleeding at this time.  Musculoskeletal:  grossly normal tone BUE/BLE, good ROM, no bony abnormality Lower extremity:  No LE edema.  Limited foot exam with no ulcerations.  2+ distal pulses. Psychiatric:  grossly normal mood and affect, speech fluent and appropriate, AOx3 Neurologic:  CN 2-12 grossly intact, moves all extremities in coordinated fashion, sensation intact    Radiological Exams on Admission: Independently reviewed - see discussion in A/P where applicable  No results found.  EKG: not released.    Labs on Admission: I have personally reviewed the available labs and imaging studies at the time of the admission.  Pertinent labs:  Hemoglobin 7.8,  INR 1.7, Bmp pending. Have called 2x regarding this.   Assessment/Plan Principal Problem:   Symptomatic anemia -75 year old female on anticoagulation presenting with bleeding from surgical excision site for large left thigh mass on 12-09-2020 by Dr. Sharol Given  found to have a hemoglobin down to 7.8. -Transfused 2 units in ED -will trend H&H's and transfuse to keep hemoglobin greater than 7. -Hold home Coumadin and aspirin as well as Lovenox that was being used post surgery she has 1 more day left on this. -Not actively bleeding at this time; however,  if starts to bleed and hemoglobin stays low may need Coumadin reversal. -INR 1.7, also on lovenox injections. Last took yesterday.  -with lupus anticoag will need to be started back on anticoagulation asap. ? Consult heme if no improvement.   Active Problems:   Disruption of surgical wound -Not actively bleeding at the moment.  -Orthopedic surgery  has been consulted and will  see patient -No signs of infection does have odd smell -will make n.p.o. at midnight just in case of any surgical intervention. -wound care consult placed     History of pulmonary embolism -Holding all anticoagulation. discussed risk of continuing this with anemia with both patient and her daughter -Hopefully be able to resume anticoagulation after transfusion and per orthopedic surgery's recommendation.    Permanent atrial fibrillation (HCC) -Rate controlled continue Lopressor.  Hold Coumadin    History of CVA (cerebrovascular accident) -Continue statin hold aspirin and Coumadin.    Essential hypertension -Blood pressures have been on the soft side.   will continue her home metoprolol for rate control.  -Hold home losartan and Lasix.    CHF (congestive heart failure) ([place]) -Appears euvolemic on exam Echo in June 2022 showed an EF of 55 to 60% with normal LV function.  Right ventricular systolic function moderately reduced.  Moderate to severe mitral stenosis. -Continue Lopressor.  Holding losartan and Lasix in setting of soft blood pressure. -Monitor intake and output and daily weights  Lupus anticoag disorder  -Holding anticoagulation in setting of symptomatic anemia in need of transfusion.  Daughter had concerns which I explained the risk and benefits of the need to hold anticoagulation in the setting of anemia.  Hopefully after she receives her blood transfusions and H&H is stay stable we can restart anticoagulation and also wait for input from orthopedic surgery or consult hematology.   Body mass index is 40.44 kg/m.    Level of care: Telemetry Medical DVT prophylaxis:  SCDs Code Status:  Full - confirmed with patient Family Communication: daughter at bedside: stephanie brown  Disposition Plan:  The patient is from: home/staying with her daughter   Anticipated d/c is [id number] house  Requires inpatient hospitalization and is at significant  risk of  worsening, requires constant monitoring, assessment and MDM with specialists.  Patient is currently: acutely ill Consults called: orthopedics by ED  Admission status:  observation   Dragon dictation used in completing this note.   Orma Flaming MD [facility]   How to contact the [facility] Attending or Consulting provider [place] or covering provider during after hours [place], for this patient?  Check the care team in [facility] and look for a) attending/consulting [facility] provider listed and b) the [facility] team listed Log into [email or web address] and use [place]'s universal password to access. If you do not have the password, please contact the hospital operator. Locate the [facility] provider you are looking for under [facility] and page to a number that you can be directly reached. If you still have difficulty reaching the provider, please page the [facility] (Director on Call) for the Hospitalists listed on amion for assistance.   12/14/2020, 5:18 PM

## 2020-12-14 NOTE — ED Provider Notes (Signed)
[facility] EMERGENCY DEPARTMENT Provider Note   CSN: [id number] Arrival date & time: 12/14/20  1219     History Chief Complaint  Patient presents with   Follow-up    [name] is a 75 y.o. female.  HPI Patient had a soft tissue mass removed from the left thigh by Dr. Sharol Given [id number].  A wound VAC was placed.  Patient is anticoagulated on Coumadin.  She reports she has changed the wound VAC container 5 times today and now there is blood filling all of the dressing that is over her lateral thigh.  Patient denies syncope.  She denies significant pain.  She reports it was more painful before but less so now.  No nausea no vomiting no fever    Past Medical History:  Diagnosis Date   (HFpEF) heart failure with preserved ejection fraction ([place])    a. 06/2008 Echo: EF 55-60%, mild LVH, triv AI, mild to mod MS, mod to sev dil LA, mildly dil RA.   Acute right MCA stroke ([place])    a. 09/25/624 Embolic stroke treated with TPA and thrombectomy with hemorrhagic transformation; Carotids 3/12 negative for ICA stenosis.   Arthritis    Bilateral Pulmonary Emboli    a. 08/2005 - s/p IVC filter.   CHF (congestive heart failure) (HCC)    Depression    Embolus of femoral artery ([place])    a. 06/2008 s/p bilateral embolectomies.   History of DVT (deep vein thrombosis)    a. s/p IVC filter.   HIT (heparin-induced thrombocytopenia) (HCC)    HTN (hypertension)    Lupus anticoagulant disorder (HCC)    Mitral stenosis    moderate-severe 09/04/20 echo   Obesity, unspecified    Permanent atrial fibrillation ([place])    a. On chronic Coumadin - INRs followed by PCP; b. CHA2DS2VASc = 5.   Popliteal artery embolism, right ([place])    a. 01/2017 in the setting of subRx INR-->s/p embolectomy.    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, left    Acute on chronic systolic HF (heart failure) ([place])  09/28/2020   Coccyx pain    CHF (congestive heart failure) ([place]) 09/04/2020   Acute respiratory failure with hypoxia ([place]) 09/04/2020   Fall at home, initial encounter 09/04/2020   Mitral valve stenosis    Essential hypertension 06/27/2017   History of lupus anticoagulant disorder 03/31/2017   NICM (nonischemic cardiomyopathy) ([place]) 03/31/2017   Streptococcal bacteremia    History of MRSA infection    Wound infection 03/24/2017   Cellulitis of right lower extremity 03/24/2017   Sepsis due to cellulitis ([place]) 03/04/2017   Popliteal artery embolus ([place]) 02/16/2017   Encounter for therapeutic drug monitoring 06/11/2013   History of CVA (cerebrovascular accident) 11/18/2010   Long term (current) use of anticoagulants 08/09/2010   Obesity, unspecified 08/14/2008   Permanent atrial fibrillation ([place]) 03/07/2008   History of pulmonary embolism 08/30/2005    Past Surgical History:  Procedure Laterality Date   APPLICATION OF WOUND VAC  12/09/2020   Procedure: APPLICATION OF WOUND VAC;  Surgeon: [name], MD;  Location: [facility];  Service: Orthopedics;;   distal radius fracture. (12,/16/2010)     EMBOLECTOMY Right 02/15/2017   Procedure: EMBOLECTOMY RIGHT LEG;  Surgeon: Elam Dutch, MD;  Location: [place];  Service: Vascular;  Laterality: Right;   EYE SURGERY Bilateral 2021  cataracts removed   I & D EXTREMITY Right 03/05/2017   Procedure: IRRIGATION AND DEBRIDEMENT RIGHT LEG HEMATOMA EVACUATION;  Surgeon: Elam Dutch, MD;  Location: [place];  Service: Vascular;  Laterality: Right;   IVC Placement 08/30/2005     LIPOMA EXCISION Left 12/09/2020   Procedure: EXCISION LIPOMA LEFT THIGH;  Surgeon: [name], MD;  Location: [place];  Service: Orthopedics;  Laterality: Left;   Open reduction and internal fixation of left intra-articular       OB History   No obstetric history on file.     Family History  Problem Relation Age of Onset   Coronary artery disease Other     Diabetes Other     Social History   Tobacco Use   Smoking status: Never   Smokeless tobacco: Never  Vaping Use   Vaping Use: Never used  Substance Use Topics   Alcohol use: No   Drug use: No    Home Medications Prior to Admission medications   Medication Sig Start Date End Date Taking? Authorizing Provider  aspirin 81 MG tablet Take 81 mg by mouth daily.      [provider]  calcitonin, salmon, (MIACALCIN/FORTICAL) 200 UNIT/ACT nasal spray Place 1 spray into alternate nostrils daily. 10/09/20   [provider]  Calcium Carb-Cholecalciferol (CALCIUM 600 + D PO) Take 1 tablet by mouth 2 (two) times daily.    [provider]  Cholecalciferol (VITAMIN D) 50 MCG (2000 UT) tablet Take 2,000 Units by mouth daily.    [provider]  enoxaparin (LOVENOX) 120 MG/0.8ML injection Inject 0.8 mLs (120 mg total) into the skin every 12 (twelve) hours. 12/03/20   Minus Breeding, MD  FLUoxetine (PROZAC) 20 MG capsule Take 20 mg by mouth daily. 04/12/17   [provider]  furosemide (LASIX) 40 MG tablet Take 1 tablet by mouth once daily 11/03/20   Lendon Colonel, NP  HYDROcodone-acetaminophen (NORCO/VICODIN) 5-325 MG tablet Take 1 tablet by mouth every 4 (four) hours as needed for moderate pain. 12/09/20   Persons, Bevely Palmer, PA  lidocaine (LIDODERM) 5 % Place 1 patch onto the skin daily. Remove & Discard patch within 12 hours or as directed by MD Patient taking differently: Place 1 patch onto the skin daily as needed (pain). Remove & Discard patch within 12 hours or as directed by MD 09/19/19   Deliah Boston, PA-C  losartan (COZAAR) 100 MG tablet Take 1 tablet by mouth once daily 10/27/20   Minus Breeding, MD  methocarbamol (ROBAXIN) 500 MG tablet Take 1 tablet (500 mg total) by mouth every 6 (six) hours as needed for muscle spasms. 09/08/20   Oswald Hillock, MD  metoprolol tartrate (LOPRESSOR) 25 MG tablet Take 1 tablet by mouth twice daily 10/27/20    Minus Breeding, MD  potassium chloride (K-DUR) 10 MEQ tablet TAKE 1 TABLET BY MOUTH ONCE DAILY 04/23/18   Erlene Quan, PA-C  warfarin (COUMADIN) 3 MG tablet TAKE 1 TO 1&1/2 TABLETS BY MOUTH DAILY AS DIRECTED BY COUMADIN CLINIC Patient taking differently: Take 3-4.5 mg by mouth See admin instructions. Take 4.5 mg daily except on Monday take 3 mg 10/27/20   Minus Breeding, MD    Allergies    Heparin and Other  Review of Systems   Review of Systems 10 systems reviewed and negative except as per HPI Physical Exam Updated Vital Signs BP 119/73   Pulse (!) 101   Temp (!) 97.4 F (36.3 C) (Tympanic)  Resp 19   Ht 5\' 5"  (1.651 m)   Wt 110.2 kg   SpO2 100%   BMI 40.44 kg/m   Physical Exam Constitutional:      Comments: Alert nontoxic.  Slightly pale in appearance.  No respiratory distress.  HENT:     Mouth/Throat:     Pharynx: Oropharynx is clear.  Eyes:     Extraocular Movements: Extraocular movements intact.  Cardiovascular:     Rate and Rhythm: Tachycardia present. Rhythm irregular.  Pulmonary:     Effort: Pulmonary effort is normal.     Breath sounds: Normal breath sounds.  Abdominal:     General: There is no distension.     Palpations: Abdomen is soft.     Tenderness: There is no abdominal tenderness. There is no guarding.  Musculoskeletal:     Comments: Large blood-filled occlusive dressing on left lateral thigh with wound VAC receptacle filled.  See attached images.  Skin:    General: Skin is warm and dry.     Coloration: Skin is pale.  Neurological:     General: No focal deficit present.     Mental Status: She is oriented to person, place, and time.     Coordination: Coordination normal.  Psychiatric:        Mood and Affect: Mood normal.     Dressing removed, there is a surgical wound with large sutures that is linear.  There is a Penrose drain in 1 portion of the wound.  There is a persistent but slow leak of dark thin blood beside the Penrose drain.  The  wound was cleaned with sterile saline and a sterile 4 x 4 gauze.  A sterile saline soaked gauze was placed over the wound then ABD pads then wound cloth tape. ED Results / Procedures / Treatments   Labs (all labs ordered are listed, but only abnormal results are displayed) Labs Reviewed  CBC WITH DIFFERENTIAL/PLATELET - Abnormal; Notable for the following components:      Result Value   RBC 2.38 (*)    Hemoglobin 7.8 (*)    HCT 25.3 (*)    MCV 106.3 (*)    Monocytes Absolute 1.2 (*)    All other components within normal limits  PROTIME-INR - Abnormal; Notable for the following components:   Prothrombin Time 19.9 (*)    INR 1.7 (*)    All other components within normal limits  RESP PANEL BY RT-PCR (FLU A&B, COVID) ARPGX2  BASIC METABOLIC PANEL  TYPE AND SCREEN  PREPARE RBC (CROSSMATCH)    EKG None  Radiology No results found.  Procedures Procedures   Medications Ordered in ED Medications  0.9 %  sodium chloride infusion (has no administration in time range)    ED Course  I have reviewed the triage vital signs and the nursing notes.  Pertinent labs & imaging results that were available during my care of the patient were reviewed by me and considered in my medical decision making (see chart for details).    MDM Rules/Calculators/A&P                           Patient is anticoagulated on Coumadin.  She has a left lateral surgical thigh wound from a soft tissue mass removal.  Patient has had extensive bleeding today.  We will proceed with INR, CBC and basic chemistry.  I will discuss management with Dr. Sharol Given (or on-call orthopedics)  Patient is significantly anemic compared  to her baseline.  At this time we will plan for admission for blood replacement and management of her persistently bleeding wound.  Patient is anticoagulated on Coumadin although she is subtherapeutic at this time.  Patient does have history of persistent atrial fibrillation.  She is in A. fib currently  rates are in the low 100s.  No sign of acute complications of A. fib at this.  Patient does not show signs of congestive heart failure. Final Clinical Impression(s) / ED Diagnoses Final diagnoses:  Symptomatic anemia  Anticoagulated on Coumadin  Bleeding from wound    Rx / DC Orders ED Discharge Orders     None        Charlesetta Shanks, MD 12/14/20 1642

## 2020-12-14 NOTE — Plan of Care (Signed)
?  Problem: Clinical Measurements: ?Goal: Will remain free from infection ?Outcome: Progressing ?  ?Problem: Activity: ?Goal: Risk for activity intolerance will decrease ?Outcome: Progressing ?  ?Problem: Elimination: ?Goal: Will not experience complications related to bowel motility ?Outcome: Progressing ?  ?Problem: Pain Managment: ?Goal: General experience of comfort will improve ?Outcome: Progressing ?  ?

## 2020-12-14 NOTE — ED Notes (Signed)
Dressing changed, incision site cleaned, new dressing applied.

## 2020-12-14 NOTE — ED Notes (Signed)
Dressing on left upper thigh dry.

## 2020-12-14 NOTE — ED Triage Notes (Signed)
Patient presents to ed via [facility] states she had a tumor removed from her left thigh on last Wed.  9/14 Has a wound vac, states she has used 5 today , wound is bleeding unsure if she pulled something loose. Patient is alert oriented. Denies pain

## 2020-12-15 DIAGNOSIS — Z20822 Contact with and (suspected) exposure to covid-19: Secondary | ICD-10-CM | POA: Diagnosis present

## 2020-12-15 DIAGNOSIS — Z7901 Long term (current) use of anticoagulants: Secondary | ICD-10-CM | POA: Diagnosis not present

## 2020-12-15 DIAGNOSIS — I4821 Permanent atrial fibrillation: Secondary | ICD-10-CM

## 2020-12-15 DIAGNOSIS — M1990 Unspecified osteoarthritis, unspecified site: Secondary | ICD-10-CM | POA: Diagnosis present

## 2020-12-15 DIAGNOSIS — R059 Cough, unspecified: Secondary | ICD-10-CM | POA: Diagnosis not present

## 2020-12-15 DIAGNOSIS — Z8249 Family history of ischemic heart disease and other diseases of the circulatory system: Secondary | ICD-10-CM | POA: Diagnosis not present

## 2020-12-15 DIAGNOSIS — Z86711 Personal history of pulmonary embolism: Secondary | ICD-10-CM

## 2020-12-15 DIAGNOSIS — I110 Hypertensive heart disease with heart failure: Secondary | ICD-10-CM | POA: Diagnosis present

## 2020-12-15 DIAGNOSIS — Z79899 Other long term (current) drug therapy: Secondary | ICD-10-CM | POA: Diagnosis not present

## 2020-12-15 DIAGNOSIS — Z95828 Presence of other vascular implants and grafts: Secondary | ICD-10-CM | POA: Diagnosis not present

## 2020-12-15 DIAGNOSIS — I517 Cardiomegaly: Secondary | ICD-10-CM | POA: Diagnosis not present

## 2020-12-15 DIAGNOSIS — Z7982 Long term (current) use of aspirin: Secondary | ICD-10-CM | POA: Diagnosis not present

## 2020-12-15 DIAGNOSIS — E669 Obesity, unspecified: Secondary | ICD-10-CM | POA: Diagnosis present

## 2020-12-15 DIAGNOSIS — D6862 Lupus anticoagulant syndrome: Secondary | ICD-10-CM

## 2020-12-15 DIAGNOSIS — D649 Anemia, unspecified: Secondary | ICD-10-CM | POA: Diagnosis not present

## 2020-12-15 DIAGNOSIS — I951 Orthostatic hypotension: Secondary | ICD-10-CM | POA: Diagnosis present

## 2020-12-15 DIAGNOSIS — Z6841 Body Mass Index (BMI) 40.0 and over, adult: Secondary | ICD-10-CM | POA: Diagnosis not present

## 2020-12-15 DIAGNOSIS — Y793 Surgical instruments, materials and orthopedic devices (including sutures) associated with adverse incidents: Secondary | ICD-10-CM | POA: Diagnosis present

## 2020-12-15 DIAGNOSIS — D62 Acute posthemorrhagic anemia: Secondary | ICD-10-CM | POA: Diagnosis present

## 2020-12-15 DIAGNOSIS — Z833 Family history of diabetes mellitus: Secondary | ICD-10-CM | POA: Diagnosis not present

## 2020-12-15 DIAGNOSIS — Z86718 Personal history of other venous thrombosis and embolism: Secondary | ICD-10-CM | POA: Diagnosis not present

## 2020-12-15 DIAGNOSIS — I5032 Chronic diastolic (congestive) heart failure: Secondary | ICD-10-CM | POA: Diagnosis present

## 2020-12-15 DIAGNOSIS — Z8673 Personal history of transient ischemic attack (TIA), and cerebral infarction without residual deficits: Secondary | ICD-10-CM | POA: Diagnosis not present

## 2020-12-15 DIAGNOSIS — T8131XA Disruption of external operation (surgical) wound, not elsewhere classified, initial encounter: Secondary | ICD-10-CM | POA: Diagnosis present

## 2020-12-15 DIAGNOSIS — T148XXA Other injury of unspecified body region, initial encounter: Secondary | ICD-10-CM

## 2020-12-15 DIAGNOSIS — Z888 Allergy status to other drugs, medicaments and biological substances status: Secondary | ICD-10-CM | POA: Diagnosis not present

## 2020-12-15 DIAGNOSIS — Z23 Encounter for immunization: Secondary | ICD-10-CM | POA: Diagnosis not present

## 2020-12-15 LAB — TYPE AND SCREEN
ABO/RH(D): O POS
Antibody Screen: NEGATIVE
Unit division: 0
Unit division: 0

## 2020-12-15 LAB — CBC
HCT: 24.8 % — ABNORMAL LOW (ref 36.0–46.0)
HCT: 24.8 % — ABNORMAL LOW (ref 36.0–46.0)
Hemoglobin: 8.1 g/dL — ABNORMAL LOW (ref 12.0–15.0)
Hemoglobin: 8 g/dL — ABNORMAL LOW (ref 12.0–15.0)
MCH: 32.3 pg (ref 26.0–34.0)
MCH: 32.5 pg (ref 26.0–34.0)
MCHC: 32.7 g/dL (ref 30.0–36.0)
MCHC: 32.3 g/dL (ref 30.0–36.0)
MCV: 98.8 fL (ref 80.0–100.0)
MCV: 100.8 fL — ABNORMAL HIGH (ref 80.0–100.0)
Platelets: 172 10*3/uL (ref 150–400)
Platelets: 130 10*3/uL — ABNORMAL LOW (ref 150–400)
RBC: 2.51 MIL/uL — ABNORMAL LOW (ref 3.87–5.11)
RBC: 2.46 MIL/uL — ABNORMAL LOW (ref 3.87–5.11)
RDW: 15.9 % — ABNORMAL HIGH (ref 11.5–15.5)
RDW: 15.9 % — ABNORMAL HIGH (ref 11.5–15.5)
WBC: 10.6 10*3/uL — ABNORMAL HIGH (ref 4.0–10.5)
WBC: 10.9 10*3/uL — ABNORMAL HIGH (ref 4.0–10.5)
nRBC: 0.3 % — ABNORMAL HIGH (ref 0.0–0.2)
nRBC: 0.3 % — ABNORMAL HIGH (ref 0.0–0.2)

## 2020-12-15 LAB — BPAM RBC
Blood Product Expiration Date: 202210202359
Blood Product Expiration Date: 202210202359
ISSUE DATE / TIME: 202209191552
ISSUE DATE / TIME: 202209191904
Unit Type and Rh: 5100
Unit Type and Rh: 5100

## 2020-12-15 LAB — BASIC METABOLIC PANEL
Anion gap: 9 (ref 5–15)
BUN: 13 mg/dL (ref 8–23)
CO2: 25 mmol/L (ref 22–32)
Calcium: 8.3 mg/dL — ABNORMAL LOW (ref 8.9–10.3)
Chloride: 101 mmol/L (ref 98–111)
Creatinine, Ser: 0.86 mg/dL (ref 0.44–1.00)
GFR, Estimated: >60 mL/min (ref >60)
Glucose, Bld: 132 mg/dL — ABNORMAL HIGH (ref 70–99)
Potassium: 4.5 mmol/L (ref 3.5–5.1)
Sodium: 135 mmol/L (ref 135–145)

## 2020-12-15 LAB — SURGICAL PATHOLOGY

## 2020-12-15 LAB — PROTIME-INR
INR: 1.5 — ABNORMAL HIGH (ref 0.8–1.2)
Prothrombin Time: 18.3 seconds — ABNORMAL HIGH (ref 11.4–15.2)

## 2020-12-15 MED ORDER — ENOXAPARIN SODIUM 120 MG/0.8ML IJ SOSY
110.0000 mg | PREFILLED_SYRINGE | Freq: Two times a day (BID) | INTRAMUSCULAR | Status: DC
  Administered 2020-12-16 – 2020-12-22 (×15): 110 mg via SUBCUTANEOUS
  Filled 2020-12-15 (×17): qty 0.74

## 2020-12-15 MED ORDER — ENOXAPARIN SODIUM 120 MG/0.8ML IJ SOSY
110.0000 mg | PREFILLED_SYRINGE | INTRAMUSCULAR | Status: AC
  Administered 2020-12-15: 110 mg via SUBCUTANEOUS
  Filled 2020-12-15: qty 0.74

## 2020-12-15 NOTE — Assessment & Plan Note (Signed)
--  resume apixaban

## 2020-12-15 NOTE — Progress Notes (Addendum)
0045 Pt blood transfusion complete. Pt denies any symptoms of reaction. Pt stated she feels better than before. Pt educated on symptoms to report to RN. RN will continue to monitor pt.  0100 Dressing changed, moderate drainage from incision site with saturated gauze and ABD pads,incision site cleaned,new dressing applied.  0630 Dressing changed,minimal drainage on gauze and ABD pads, incision site clean and dry, incision site cleaned.

## 2020-12-15 NOTE — Care Management Obs Status (Signed)
[place] NOTIFICATION   Patient Details  Name: [name] MRN: [id number] Date of Birth: [birth year]   Medicare Observation Status Notification Given:  Yes    Sharin Mons, RN 12/15/2020, 3:32 PM

## 2020-12-15 NOTE — Hospital Course (Addendum)
75 year old woman PMH atrial fibrillation on warfarin, history of PE and DVT, status post soft tissue mass excision 9/14 Dr. Sharol Given, presented with bleeding from wound.  Admitted for postoperative complication symptomatic acute blood loss anemia.  Patient was on Lovenox bridge and warfarin, INR was 1.7.

## 2020-12-15 NOTE — Progress Notes (Signed)
ANTICOAGULATION CONSULT NOTE - Initial Consult  Pharmacy Consult for Lovenox Indication: history of PE/DVT, history of CVA/Afib, Lupus anticoagulant   Allergies  Allergen Reactions   Heparin Hives    Patient attests severe allergy- rash all over, severe itching, unable to take heparin   Other Itching and Rash    "Sheets and Linens used by [facility]"    Patient Measurements: Height: 5\' 5"  (165.1 cm) Weight: 110.2 kg (243 lb) IBW/kg (Calculated) : 57  Vital Signs: Temp: 99.8 F (37.7 C) (09/20 0051) Temp Source: Oral (09/20 0051) BP: 116/95 (09/20 0843) Pulse Rate: 97 (09/20 0843)  Labs: Recent Labs    12/14/20 1231 12/14/20 2051 12/15/20 0324 12/15/20 0925  HGB 7.8*  --  8.1*  --   HCT 25.3*  --  24.8*  --   PLT 184  --  172  --   LABPROT 19.9*  --   --  18.3*  INR 1.7*  --   --  1.5*  CREATININE  --  1.03* 0.86  --     Estimated Creatinine Clearance: 69.9 mL/min (by C-G formula based on SCr of 0.86 mg/dL).   Medical History: Past Medical History:  Diagnosis Date   (HFpEF) heart failure with preserved ejection fraction ([place])    a. 06/2008 Echo: EF 55-60%, mild LVH, triv AI, mild to mod MS, mod to sev dil LA, mildly dil RA.   Acute right MCA stroke ([place])    a. 11/28/8099 Embolic stroke treated with TPA and thrombectomy with hemorrhagic transformation; Carotids 3/12 negative for ICA stenosis.   Arthritis    Bilateral Pulmonary Emboli    a. 08/2005 - s/p IVC filter.   CHF (congestive heart failure) (HCC)    Depression    Embolus of femoral artery ([place])    a. 06/2008 s/p bilateral embolectomies.   History of DVT (deep vein thrombosis)    a. s/p IVC filter.   HIT (heparin-induced thrombocytopenia) (HCC)    HTN (hypertension)    Lupus anticoagulant disorder (HCC)    Mitral stenosis    moderate-severe 09/04/20 echo   Obesity, unspecified    Permanent atrial fibrillation ([place])    a. On chronic Coumadin - INRs followed by PCP; b. CHA2DS2VASc = 5.   Popliteal artery  embolism, right ([place])    a. 01/2017 in the setting of subRx INR-->s/p embolectomy.   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 INR 1.7. Was on LMWH post-op until admission for bleeding 9/19.  Has heparin allergy but not HIT - Hgb 8.1 s/p 2 units. Plts 172.    Goal of Therapy:  Anti-Xa level 0.6-1 units/ml 4hrs after LMWH dose given Monitor platelets by anticoagulation protocol: Yes   Plan:  Lovenox 110mg /12h hrs.   Levern Pitter S. Alford Highland, PharmD, BCPS Clinical Staff Pharmacist [email or web address] Alford Highland, Ranvir Renovato Stillinger 12/15/2020,12:28 PM

## 2020-12-15 NOTE — Assessment & Plan Note (Signed)
--  resume warfarin

## 2020-12-15 NOTE — Consult Note (Signed)
WOC Nurse Consult Note: Reason for Consult:Post surgical removal of a lipoma on 12/09/20 with bleeding. Seen in office on 12/10/20.  NPWT in place. WOC is simultaneously consulted with Orthopedics (Dr. Sharol Given). Wound type:Surgical Pressure Injury POA: N/A  I have communicated with [name], Orthopedic PA-C via [place] to let him know that [name] will defer to Ortho for management of this wound.  [place] nursing team will not follow, but will remain available to this patient, the nursing and medical teams.  Please re-consult if needed. Thanks, Melody Flakes, MSN, RN, [place], [name]  Pager# [phone]

## 2020-12-15 NOTE — Assessment & Plan Note (Addendum)
--  resume Lovenox, metoprolol

## 2020-12-15 NOTE — Progress Notes (Addendum)
PROGRESS NOTE  [name] TFT:[id number] DOB: April 07, 1945 DOA: 12/14/2020 PCP: Josetta Huddle, MD  Brief History   75 year old woman PMH atrial fibrillation on warfarin, history of PE and DVT, status post soft tissue mass excision 9/14 Dr. Sharol Given, presented with bleeding from wound.  Admitted for postoperative complication symptomatic acute blood loss anemia.  Patient was on Lovenox bridge and warfarin, INR was 1.7.  A & P  Acute blood loss anemia -- Postoperative complication, status post excision large left thigh mass.  Complicated by Lovenox bridge and warfarin. -- Hemoglobin stable status post 2 units PRBC.  Check CBC in AM. -- Management per orthopedics in regard to wound.  Discussed with Dr. Sharol Given, okay to restart Lovenox.  Lupus anticoagulant syndrome (HCC) --resume apixaban  Bleeding from wound --Management per Dr Sharol Given  History of pulmonary embolism --resume Lovenox  Permanent atrial fibrillation ([place]) --resume Lovenox, metoprolol  History of CVA (cerebrovascular accident) --resume warfarin  Disposition Plan:  Discussion: No bleeding now, hemoglobin appropriately increased.  Trend hemoglobin.  Given propensity for VTE with patient's medical history, as well as external site of bleeding, resume enoxaparin follow-up.  If stable next 24 hours, can probably go home and resume Coumadin.  Status is: Inpatient  Remains inpatient appropriate because:Inpatient level of care appropriate due to severity of illness  Dispo: The patient is from: Home              Anticipated d/c is to: Home              Patient currently is not medically stable to d/c.   Difficult to place patient No DVT prophylaxis: SCDs Start: 12/14/20 1717   Code Status: Full Code Level of care: Med-Surg Family Communication: daughter at bedside  Murray Hodgkins, MD  [facility] Direct contact: see [email or web address] (further directions at bottom of note if needed) 7PM-7AM contact night coverage as at bottom  of note 12/15/2020, 5:38 PM  LOS: 0 days   Significant Hospital Events   9/19 admit for ABLA post-op complication   Consults:  Orthopedics    Procedures:  9/19 2 units PRBC   Significant Diagnostic Tests:     Micro Data:  None    Antimicrobials:  None   Interval History/Subjective  CC: f/u bleeding  Feels better, no bleeding  Objective   Vitals:  Vitals:   12/15/20 0843 12/15/20 1545  BP: (!) 116/95 (!) 105/47  Pulse: 97 84  Resp:  18  Temp:  98.6 F (37 C)  SpO2:  99%    Exam: Physical Exam Vitals reviewed.  Constitutional:      General: She is not in acute distress.    Appearance: She is not ill-appearing or toxic-appearing.  Cardiovascular:     Rate and Rhythm: Normal rate and regular rhythm.     Heart sounds: No murmur heard. Pulmonary:     Effort: No respiratory distress.     Breath sounds: No wheezing, rhonchi or rales.  Skin:    Comments: Left thigh wound covered, not inspected  Neurological:     Mental Status: She is alert.  Psychiatric:        Mood and Affect: Mood normal.        Behavior: Behavior normal.   I have personally reviewed the labs and other data, making special note of:   Today's Data  Hgb stable 8.0 s/p transfusion  Scheduled Meds:  calcium-vitamin D   Oral BID   cholecalciferol  2,000 Units Oral Daily   [START  ON 12/16/2020] enoxaparin (LOVENOX) injection  110 mg Subcutaneous Q12H   FLUoxetine  20 mg Oral Daily   metoprolol tartrate  25 mg Oral BID   potassium chloride  10 mEq Oral Daily   sodium chloride flush  3 mL Intravenous Q12H   Continuous Infusions:  sodium chloride     sodium chloride      Principal Problem:   Symptomatic anemia Active Problems:   Acute blood loss anemia   History of pulmonary embolism   Bleeding from wound   Lupus anticoagulant syndrome (HCC)   Permanent atrial fibrillation (HCC)   History of CVA (cerebrovascular accident)   Essential hypertension   CHF (congestive heart failure)  ([place])   LOS: 0 days   How to contact the [facility] Attending or Consulting provider 7A - 7P or covering provider during after hours [place], for this patient?  Check the care team in [facility] and look for a) attending/consulting TRH provider listed and b) the [facility] team listed Log into [email or web address] and use [place]'s universal password to access. If you do not have the password, please contact the hospital operator. Locate the [facility] provider you are looking for under [facility] and page to a number that you can be directly reached. If you still have difficulty reaching the provider, please page the [facility] (Director on Call) for the Hospitalists listed on amion for assistance.

## 2020-12-15 NOTE — Assessment & Plan Note (Signed)
--  resume Lovenox

## 2020-12-15 NOTE — Progress Notes (Signed)
Patient ID: [name], female   DOB: 01-28-1946, 75 y.o.   MRN: [id number] Patient is seen in follow-up status post removal of large tumor left thigh.  The wound VAC dressing was removed examination today the Penrose dressing is removed there is a small amount of clear serosanguineous drainage no active bleeding.  Patient's hemoglobin is 8.1 after transfusion with 2 units packed red blood cells.  White cell count 10.6.  Tissue pathology results pending.

## 2020-12-15 NOTE — Assessment & Plan Note (Signed)
--  Management per Dr Sharol Given

## 2020-12-15 NOTE — Assessment & Plan Note (Signed)
--   Postoperative complication, status post excision large left thigh mass.  Complicated by Lovenox bridge and warfarin. -- Hemoglobin stable status post 2 units PRBC.  Check CBC in AM. -- Management per orthopedics in regard to wound.  Discussed with Dr. Sharol Given, okay to restart Lovenox.

## 2020-12-16 ENCOUNTER — Encounter: Payer: Medicare Other | Admitting: Physician Assistant

## 2020-12-16 DIAGNOSIS — D649 Anemia, unspecified: Secondary | ICD-10-CM | POA: Diagnosis not present

## 2020-12-16 LAB — CBC
HCT: 24.2 % — ABNORMAL LOW (ref 36.0–46.0)
Hemoglobin: 7.8 g/dL — ABNORMAL LOW (ref 12.0–15.0)
MCH: 32.6 pg (ref 26.0–34.0)
MCHC: 32.2 g/dL (ref 30.0–36.0)
MCV: 101.3 fL — ABNORMAL HIGH (ref 80.0–100.0)
Platelets: 181 10*3/uL (ref 150–400)
RBC: 2.39 MIL/uL — ABNORMAL LOW (ref 3.87–5.11)
RDW: 15.5 % (ref 11.5–15.5)
WBC: 9.2 10*3/uL (ref 4.0–10.5)
nRBC: 0.4 % — ABNORMAL HIGH (ref 0.0–0.2)

## 2020-12-16 LAB — HEMOGLOBIN AND HEMATOCRIT, BLOOD
HCT: 23.5 % — ABNORMAL LOW (ref 36.0–46.0)
HCT: 24.5 % — ABNORMAL LOW (ref 36.0–46.0)
Hemoglobin: 7.5 g/dL — ABNORMAL LOW (ref 12.0–15.0)
Hemoglobin: 7.7 g/dL — ABNORMAL LOW (ref 12.0–15.0)

## 2020-12-16 LAB — PROTIME-INR
INR: 1.5 — ABNORMAL HIGH (ref 0.8–1.2)
Prothrombin Time: 17.7 seconds — ABNORMAL HIGH (ref 11.4–15.2)

## 2020-12-16 MED ORDER — FUROSEMIDE 40 MG PO TABS
40.0000 mg | ORAL_TABLET | Freq: Every day | ORAL | Status: DC
  Administered 2020-12-16 – 2020-12-23 (×8): 40 mg via ORAL
  Filled 2020-12-16 (×8): qty 1

## 2020-12-16 MED ORDER — INFLUENZA VAC A&B SA ADJ QUAD 0.5 ML IM PRSY
0.5000 mL | PREFILLED_SYRINGE | INTRAMUSCULAR | Status: AC
  Administered 2020-12-17: 0.5 mL via INTRAMUSCULAR
  Filled 2020-12-16: qty 0.5

## 2020-12-16 NOTE — Progress Notes (Signed)
PROGRESS NOTE    [name]  LPF:[id number] DOB: 04/30/1945 DOA: 12/14/2020 PCP: Josetta Huddle, MD   Chief Complaint  Patient presents with   Follow-up   Brief Narrative 75 yo with hx atrial fibrillation on warfarin, hx PT/DVT, hx lupus anticoagulant, with recent surgery with Dr. Sharol Given for the excision of Patricie Geeslin left thigh mass represents with acute blood loss anemia and post operative bleeding.  She was on lovenox bridge with warfarin at the time of admission.  See below for additional details   Assessment & Plan:   Principal Problem:   Symptomatic anemia Active Problems:   History of pulmonary embolism   Permanent atrial fibrillation (HCC)   History of CVA (cerebrovascular accident)   Essential hypertension   CHF (congestive heart failure) (HCC)   Bleeding from wound   Acute blood loss anemia   Lupus anticoagulant syndrome (HCC)  Acute Blood Loss Anemia  Post Operative Blood Loss - Hb 9/7 12.3 - hb 9/19 7.8 - s/p 2 units pRBC with inappropriate response to ~8 - Hb continues to downtrend today - will continue to trend H/H for now - continue lovenox for now - appreciate orthopedics c/s - wrap thigh with ace wrap   Lupus anticoagulant - continue lovenox for now - warfarin remains on hold   Hx DVT  PE - continue lovenox - holding warfarin for now  Hx CVA - noted, continue anticoagulation  DVT prophylaxis: lovenox Code Statusfull  Family Communication: daughter at bedside Disposition:   Status is: Inpatient  Remains inpatient appropriate because:Inpatient level of care appropriate due to severity of illness  Dispo: The patient is from: Home              Anticipated d/c is to: Home              Patient currently is not medically stable to d/c.   Difficult to place patient No       Consultants:  orthopedics  Procedures:  Excision soft tissue mass L thigh 9/14  Antimicrobials:  Anti-infectives (From admission, onward)    None           Subjective: No new complaints Hasn't been up and about yet  Objective: Vitals:   12/15/20 1545 12/15/20 2048 12/16/20 0700 12/16/20 1500  BP: (!) 105/47 122/86 (!) 110/57 (!) 110/94  Pulse: 84 100 88 (!) 103  Resp: 18 18 18 19   Temp: 98.6 F (37 C) 98.8 F (37.1 C) 97.9 F (36.6 C) 98.3 F (36.8 C)  TempSrc: Oral Oral Oral Oral  SpO2: 99% 98% 100% 98%  Weight:      Height:        Intake/Output Summary (Last 24 hours) at 12/16/2020 1756 Last data filed at 12/16/2020 1500 Gross per 24 hour  Intake 840 ml  Output 1400 ml  Net -560 ml   Filed Weights   12/14/20 1234  Weight: 110.2 kg    Examination:  General exam: Appears calm and comfortable  Respiratory system: unlabored Cardiovascular system: RRR Gastrointestinal system: Abdomen is nondistended, soft and nontender. Central nervous system: Alert and oriented. No focal neurological deficits. Extremities: left thigh with intact stiches - dressing over wound with some sanguinous bleed through on distal part of wound Skin: No rashes, lesions or ulcers Psychiatry: Judgement and insight appear normal. Mood & affect appropriate.     Data Reviewed: I have personally reviewed following labs and imaging studies  CBC: Recent Labs  Lab 12/14/20 1231 12/15/20 0324 12/15/20 0925 12/16/20  5852 12/16/20 1250  WBC 9.9 10.6* 10.9* 9.2  --   NEUTROABS 7.4  --   --   --   --   HGB 7.8* 8.1* 8.0* 7.8* 7.5*  HCT 25.3* 24.8* 24.8* 24.2* 23.5*  MCV 106.3* 98.8 100.8* 101.3*  --   PLT 184 172 130* 181  --     Basic Metabolic Panel: Recent Labs  Lab 12/14/20 2051 12/15/20 0324  NA 134* 135  K 4.1 4.5  CL 101 101  CO2 25 25  GLUCOSE 201* 132*  BUN 12 13  CREATININE 1.03* 0.86  CALCIUM 8.1* 8.3*    GFR: Estimated Creatinine Clearance: 69.9 mL/min (by C-G formula based on SCr of 0.86 mg/dL).  Liver Function Tests: No results for input(s): AST, ALT, ALKPHOS, BILITOT, PROT, ALBUMIN in the last 168  hours.  CBG: No results for input(s): GLUCAP in the last 168 hours.   Recent Results (from the past 240 hour(s))  Resp Panel by RT-PCR (Flu Kassidi Elza&B, Covid) Nasopharyngeal Swab     Status: None   Collection Time: 12/14/20  4:38 PM   Specimen: Nasopharyngeal Swab; Nasopharyngeal(NP) swabs in vial transport medium  Result Value Ref Range Status   SARS Coronavirus 2 by RT PCR NEGATIVE NEGATIVE Final    Comment: (NOTE) SARS-CoV-2 target nucleic acids are NOT DETECTED.  The SARS-CoV-2 RNA is generally detectable in upper respiratory specimens during the acute phase of infection. The lowest concentration of SARS-CoV-2 viral copies this assay can detect is 138 copies/mL. Takeria Marquina negative result does not preclude SARS-Cov-2 infection and should not be used as the sole basis for treatment or other patient management decisions. Lawsyn Heiler negative result may occur with  improper specimen collection/handling, submission of specimen other than nasopharyngeal swab, presence of viral mutation(s) within the areas targeted by this assay, and inadequate number of viral copies(<138 copies/mL). Jatia Musa negative result must be combined with clinical observations, patient history, and epidemiological information. The expected result is Negative.  Fact Sheet for Patients:  EntrepreneurPulse.com.au  Fact Sheet for Healthcare Providers:  IncredibleEmployment.be  This test is no t yet approved or cleared by the [place] FDA and  has been authorized for detection and/or diagnosis of SARS-CoV-2 by FDA under an Emergency Use Authorization (EUA). This EUA will remain  in effect (meaning this test can be used) for the duration of the COVID-19 declaration under Section 564(b)(1) of the Act, 21 U.S.C.section 360bbb-3(b)(1), unless the authorization is terminated  or revoked sooner.       Influenza Garo Heidelberg by PCR NEGATIVE NEGATIVE Final   Influenza B by PCR NEGATIVE NEGATIVE Final    Comment:  (NOTE) The Xpert Xpress SARS-CoV-2/FLU/RSV plus assay is intended as an aid in the diagnosis of influenza from Nasopharyngeal swab specimens and should not be used as Breanda Greenlaw sole basis for treatment. Nasal washings and aspirates are unacceptable for Xpert Xpress SARS-CoV-2/FLU/RSV testing.  Fact Sheet for Patients: EntrepreneurPulse.com.au  Fact Sheet for Healthcare Providers: IncredibleEmployment.be  This test is not yet approved or cleared by the [place] FDA and has been authorized for detection and/or diagnosis of SARS-CoV-2 by FDA under an Emergency Use Authorization (EUA). This EUA will remain in effect (meaning this test can be used) for the duration of the COVID-19 declaration under Section 564(b)(1) of the Act, 21 U.S.C. section 360bbb-3(b)(1), unless the authorization is terminated or revoked.  Performed at [facility], [place] [address], [place], [place] [address]          Radiology Studies:  No results found.      Scheduled Meds:  calcium-vitamin D   Oral BID   cholecalciferol  2,000 Units Oral Daily   enoxaparin (LOVENOX) injection  110 mg Subcutaneous Q12H   FLUoxetine  20 mg Oral Daily   furosemide  40 mg Oral Daily   [START ON 12/17/2020] influenza vaccine adjuvanted  0.5 mL Intramuscular Tomorrow-1000   metoprolol tartrate  25 mg Oral BID   potassium chloride  10 mEq Oral Daily   sodium chloride flush  3 mL Intravenous Q12H   Continuous Infusions:  sodium chloride     sodium chloride       LOS: 1 day    Time spent: over 30 min    Fayrene Helper, MD [facility]   To contact the attending provider between 7A-7P or the covering provider during after hours 7P-7A, please log into the web site [email or web address] and access using universal [place] password for that web site. If you do not have the password, please call the hospital operator.  12/16/2020, 5:56 PM

## 2020-12-16 NOTE — Evaluation (Signed)
Physical Therapy Evaluation Patient Details Name: [name] MRN: [id number] DOB: 12-Aug-1945 Today's Date: 12/16/2020  History of Present Illness  75 y.o. female presents to [facility] ED on 12/14/2020 with bleeding from wound, recently undergoing L thigh tumor excision on 9/14. Pt admitted for management of postoperative complication symptomatic acute blood loss anemia. PMH includes atrial fibrillation on warfarin, history of PE and DVT.  Clinical Impression  Pt presents to PT with deficits in power, strength, balance, activity tolerance, gait, and with L thigh pain. Pt currently requires physical assistance to perform bed mobility and transfers due to weakness. Pt with some improvement with verbal cues for transfer technique and anticipate continued improvement with further transfer training. Pt will benefit from continued aggressive mobilization to improve activity tolerance and to aide in a return to independence. PT recommends discharge home with HHPT and continued assistance of daughter.       Recommendations for follow up therapy are one component of a multi-disciplinary discharge planning process, led by the attending physician.  Recommendations may be updated based on patient status, additional functional criteria and insurance authorization.  Follow Up Recommendations Home health PT;Supervision for mobility/OOB    Equipment Recommendations  None recommended by PT    Recommendations for Other Services       Precautions / Restrictions Precautions Precautions: Fall Precaution Comments: L thigh wound Restrictions Weight Bearing Restrictions: No      Mobility  Bed Mobility Overal bed mobility: Needs Assistance Bed Mobility: Supine to Sit     Supine to sit: Min assist;HOB elevated     General bed mobility comments: use of bed rail    Transfers Overall transfer level: Needs assistance Equipment used: Rolling walker (2 wheeled) Transfers: Sit to/from Stand Sit to Stand: Min  assist;Min guard         General transfer comment: cues for increased trunk flexion and anterior lean  Ambulation/Gait Ambulation/Gait assistance: Min guard Gait Distance (Feet): 50 Feet Assistive device: Rolling walker (2 wheeled) Gait Pattern/deviations: Step-through pattern Gait velocity: reduced Gait velocity interpretation: <1.8 ft/sec, indicate of risk for recurrent falls General Gait Details: pt with slowed step-through gait, widened stance  Stairs            Wheelchair Mobility    Modified Rankin (Stroke Patients Only)       Balance Overall balance assessment: Needs assistance Sitting-balance support: No upper extremity supported;Feet supported Sitting balance-Leahy Scale: Good     Standing balance support: Bilateral upper extremity supported Standing balance-Leahy Scale: Poor Standing balance comment: pt reliant on UE support of RW                             Pertinent Vitals/Pain Pain Assessment: Faces Faces Pain Scale: Hurts even more Pain Location: L thigh Pain Descriptors / Indicators: Grimacing Pain Intervention(s): Monitored during session    Home Living Family/patient expects to be discharged to:: Private residence Living Arrangements: Alone (daughter has been living with patient since tumor excision 9/14) Available Help at Discharge: Family;Available 24 hours/day Type of Home: House Home Access: Stairs to enter Entrance Stairs-Rails: Right Entrance Stairs-Number of Steps: 2 Home Layout: One level Home Equipment: Grab bars - tub/shower;Walker - 2 wheels;Cane - single point;Bedside commode;Shower seat      Prior Function Level of Independence: Needs assistance   Gait / Transfers Assistance Needed: pt reports needing assistance with transfers since recent SNF admissionin July. Has been continuing with HHPT and improving  Hand Dominance   Dominant Hand: Right    Extremity/Trunk Assessment   Upper Extremity  Assessment Upper Extremity Assessment: Overall WFL for tasks assessed    Lower Extremity Assessment Lower Extremity Assessment: Generalized weakness    Cervical / Trunk Assessment Cervical / Trunk Assessment: Kyphotic  Communication   Communication: No difficulties  Cognition Arousal/Alertness: Awake/alert Behavior During Therapy: WFL for tasks assessed/performed Overall Cognitive Status: Within Functional Limits for tasks assessed                                        General Comments General comments (skin integrity, edema, etc.): VSS on RA    Exercises     Assessment/Plan    PT Assessment Patient needs continued PT services  PT Problem List Decreased strength;Decreased balance;Decreased activity tolerance;Decreased mobility;Pain       PT Treatment Interventions Gait training;DME instruction;Functional mobility training;Stair training;Therapeutic activities;Therapeutic exercise;Balance training;Neuromuscular re-education;Patient/family education    PT Goals (Current goals can be found in the Care Plan section)  Acute Rehab PT Goals Patient Stated Goal: to go home and improve activity tolerance PT Goal Formulation: With patient Time For Goal Achievement: 12/30/20 Potential to Achieve Goals: Good    Frequency Min 3X/week   Barriers to discharge        Co-evaluation               AM-PAC PT "6 Clicks" Mobility  Outcome Measure Help needed turning from your back to your side while in a flat bed without using bedrails?: A Little Help needed moving from lying on your back to sitting on the side of a flat bed without using bedrails?: A Little Help needed moving to and from a bed to a chair (including a wheelchair)?: A Little Help needed standing up from a chair using your arms (e.g., wheelchair or bedside chair)?: A Little Help needed to walk in hospital room?: A Little Help needed climbing 3-5 steps with a railing? : A Lot 6 Click Score:  17    End of Session   Activity Tolerance: Patient tolerated treatment well Patient left: in chair;with call bell/phone within reach;with chair alarm set;with family/visitor present Nurse Communication: Mobility status PT Visit Diagnosis: Other abnormalities of gait and mobility (R26.89);Muscle weakness (generalized) (M62.81);Pain Pain - Right/Left: Left Pain - part of body:  (thigh)    Time: 1779-3903 PT Time Calculation (min) (ACUTE ONLY): 30 min   Charges:   PT Evaluation $PT Eval Low Complexity: 1 Low PT Treatments $Therapeutic Activity: 8-22 mins        Zenaida Niece, PT, DPT Acute Rehabilitation Pager: [phone]   Zenaida Niece 12/16/2020, 3:41 PM

## 2020-12-16 NOTE — Plan of Care (Signed)
Problem: Education: Goal: Knowledge of General Education information will improve Description: Including pain rating scale, medication(s)/side effects and non-pharmacologic comfort measures Outcome: Progressing   Problem: Health Behavior/Discharge Planning: Goal: Ability to manage health-related needs will improve Outcome: Progressing   Problem: Clinical Measurements: Goal: Ability to maintain clinical measurements within normal limits will improve Outcome: Progressing Goal: Will remain free from infection Outcome: Progressing Goal: Diagnostic test results will improve Outcome: Progressing Goal: Respiratory complications will improve Outcome: Progressing Goal: Cardiovascular complication will be avoided Outcome: Progressing   Problem: Activity: Goal: Risk for activity intolerance will decrease Outcome: Progressing   Problem: Nutrition: Goal: Adequate nutrition will be maintained Outcome: Progressing   Problem: Coping: Goal: Level of anxiety will decrease Outcome: Progressing   Problem: Elimination: Goal: Will not experience complications related to bowel motility Outcome: Progressing Goal: Will not experience complications related to urinary retention Outcome: Progressing   Problem: Pain Managment: Goal: General experience of comfort will improve Outcome: Progressing   Problem: Safety: Goal: Ability to remain free from injury will improve Outcome: Progressing   Problem: Skin Integrity: Goal: Risk for impaired skin integrity will decrease Outcome: Progressing   Report received and care assumed from previous shift RN. VS obtained, shift assessments completed - see flowsheets. Denies need for pain intervention. Encouraged and educated patient on need to turn and reposition q2hr to prevention skin breakdown, verbalizes understanding. L hip dressing remains CDI this shift. Currently resting in bed, bed in lowest position. Denies needs. Call bell within reach. Bedalarm in  use at all times.

## 2020-12-17 DIAGNOSIS — D649 Anemia, unspecified: Secondary | ICD-10-CM | POA: Diagnosis not present

## 2020-12-17 LAB — CBC WITH DIFFERENTIAL/PLATELET
Abs Immature Granulocytes: 0.05 10*3/uL (ref 0.00–0.07)
Basophils Absolute: 0.1 10*3/uL (ref 0.0–0.1)
Basophils Relative: 1 %
Eosinophils Absolute: 0.1 10*3/uL (ref 0.0–0.5)
Eosinophils Relative: 1 %
HCT: 23.1 % — ABNORMAL LOW (ref 36.0–46.0)
Hemoglobin: 7.5 g/dL — ABNORMAL LOW (ref 12.0–15.0)
Immature Granulocytes: 1 %
Lymphocytes Relative: 9 %
Lymphs Abs: 1 10*3/uL (ref 0.7–4.0)
MCH: 33.2 pg (ref 26.0–34.0)
MCHC: 32.5 g/dL (ref 30.0–36.0)
MCV: 102.2 fL — ABNORMAL HIGH (ref 80.0–100.0)
Monocytes Absolute: 1.1 10*3/uL — ABNORMAL HIGH (ref 0.1–1.0)
Monocytes Relative: 11 %
Neutro Abs: 8 10*3/uL — ABNORMAL HIGH (ref 1.7–7.7)
Neutrophils Relative %: 77 %
Platelets: 226 10*3/uL (ref 150–400)
RBC: 2.26 MIL/uL — ABNORMAL LOW (ref 3.87–5.11)
RDW: 15.6 % — ABNORMAL HIGH (ref 11.5–15.5)
WBC: 10.3 10*3/uL (ref 4.0–10.5)
nRBC: 0.3 % — ABNORMAL HIGH (ref 0.0–0.2)

## 2020-12-17 LAB — COMPREHENSIVE METABOLIC PANEL
ALT: 11 U/L (ref 0–44)
AST: 12 U/L — ABNORMAL LOW (ref 15–41)
Albumin: 2.4 g/dL — ABNORMAL LOW (ref 3.5–5.0)
Alkaline Phosphatase: 58 U/L (ref 38–126)
Anion gap: 7 (ref 5–15)
BUN: 15 mg/dL (ref 8–23)
CO2: 28 mmol/L (ref 22–32)
Calcium: 8.3 mg/dL — ABNORMAL LOW (ref 8.9–10.3)
Chloride: 98 mmol/L (ref 98–111)
Creatinine, Ser: 1.07 mg/dL — ABNORMAL HIGH (ref 0.44–1.00)
GFR, Estimated: 54 mL/min — ABNORMAL LOW (ref >60)
Glucose, Bld: 186 mg/dL — ABNORMAL HIGH (ref 70–99)
Potassium: 4.1 mmol/L (ref 3.5–5.1)
Sodium: 133 mmol/L — ABNORMAL LOW (ref 135–145)
Total Bilirubin: 1.2 mg/dL (ref 0.3–1.2)
Total Protein: 5.8 g/dL — ABNORMAL LOW (ref 6.5–8.1)

## 2020-12-17 LAB — PROTIME-INR
INR: 1.3 — ABNORMAL HIGH (ref 0.8–1.2)
Prothrombin Time: 15.8 seconds — ABNORMAL HIGH (ref 11.4–15.2)

## 2020-12-17 MED ORDER — PHENOL 1.4 % MT LIQD
1.0000 | OROMUCOSAL | Status: DC | PRN
  Administered 2020-12-17: 1 via OROMUCOSAL
  Filled 2020-12-17: qty 177

## 2020-12-17 MED ORDER — WARFARIN - PHARMACIST DOSING INPATIENT: Freq: Every day | Status: DC

## 2020-12-17 MED ORDER — LORATADINE 10 MG PO TABS
10.0000 mg | ORAL_TABLET | Freq: Every day | ORAL | Status: DC | PRN
  Administered 2020-12-17 (×2): 10 mg via ORAL
  Filled 2020-12-17 (×2): qty 1

## 2020-12-17 MED ORDER — WARFARIN SODIUM 7.5 MG PO TABS
7.5000 mg | ORAL_TABLET | Freq: Once | ORAL | Status: AC
  Administered 2020-12-17: 7.5 mg via ORAL
  Filled 2020-12-17: qty 1

## 2020-12-17 NOTE — Progress Notes (Signed)
PROGRESS NOTE    [name]  JKK:[id number] DOB: 1946-02-07 DOA: 12/14/2020 PCP: Josetta Huddle, MD   Chief Complaint  Patient presents with   Follow-up   Brief Narrative 75 yo with hx atrial fibrillation on warfarin, hx PT/DVT, hx lupus anticoagulant, with recent surgery with Dr. Sharol Given for the excision of [name] left thigh mass represents with acute blood loss anemia and post operative bleeding.  She was on lovenox bridge with warfarin at the time of admission.  See below for additional details   Assessment & Plan:   Principal Problem:   Symptomatic anemia Active Problems:   History of pulmonary embolism   Permanent atrial fibrillation (HCC)   History of CVA (cerebrovascular accident)   Essential hypertension   CHF (congestive heart failure) (HCC)   Bleeding from wound   Acute blood loss anemia   Lupus anticoagulant syndrome (HCC)  Acute Blood Loss Anemia  Post Operative Blood Loss - Hb 9/7 12.3 - hb 9/19 7.8 at presentation - s/p 2 units pRBC with inappropriate response to ~8 - Hb 7.5 today - will continue to trend H/H for now - transfuse for symptomatic or <7 - doesn't appear symptomatic at this time - continue lovenox for now, start warfarin - given her complexity and need for lovenox bridge in setting of her risk for clotting, will maintain inpatient for now - appreciate orthopedics c/s - wrap thigh with ace wrap   Lupus anticoagulant - continue lovenox for now - will resume warfarin  Hx DVT  PE - continue lovenox - will resume warfarin  Hx CVA - noted, continue anticoagulation  Atrial fibrillation - continue warfarin  DVT prophylaxis: lovenox Code Statusfull  Family Communication: daughter at bedside Disposition:   Status is: Inpatient  Remains inpatient appropriate because:Inpatient level of care appropriate due to severity of illness  Dispo: The patient is from: Home              Anticipated d/c is to: Home              Patient currently is not  medically stable to d/c.   Difficult to place patient No       Consultants:  orthopedics  Procedures:  Excision soft tissue mass L thigh 9/14  Antimicrobials:  Anti-infectives (From admission, onward)    None          Subjective: No complaints Had some nausea briefly with therapy, that's resolved  Objective: Vitals:   12/16/20 1500 12/16/20 2111 12/17/20 0727 12/17/20 1229  BP: (!) 110/94 108/74 (!) 114/48 (!) 135/51  Pulse: (!) 103 (!) 106 78 100  Resp: 19 18 20 19   Temp: 98.3 F (36.8 C) 98 F (36.7 C) 98 F (36.7 C) 98.9 F (37.2 C)  TempSrc: Oral Oral Oral Oral  SpO2: 98% 97% 96% 97%  Weight:      Height:        Intake/Output Summary (Last 24 hours) at 12/17/2020 1936 Last data filed at 12/17/2020 1500 Gross per 24 hour  Intake --  Output 1650 ml  Net -1650 ml   Filed Weights   12/14/20 1234  Weight: 110.2 kg    Examination:  General: No acute distress. Cardiovascular: RRR Lungs: unlabored Abdomen: Soft, nontender, nondistended Neurological: Alert and oriented 3. Moves all extremities 4 with equal strength. Cranial nerves II through XII grossly intact. Skin: Warm and dry. No rashes or lesions. Extremities: LLE with intact dressing, some bleeding through 4x4s on wound     Data  Reviewed: I have personally reviewed following labs and imaging studies  CBC: Recent Labs  Lab 12/14/20 1231 12/15/20 0324 12/15/20 0925 12/16/20 0223 12/16/20 1250 12/16/20 2030 12/17/20 0847  WBC 9.9 10.6* 10.9* 9.2  --   --  10.3  NEUTROABS 7.4  --   --   --   --   --  8.0*  HGB 7.8* 8.1* 8.0* 7.8* 7.5* 7.7* 7.5*  HCT 25.3* 24.8* 24.8* 24.2* 23.5* 24.5* 23.1*  MCV 106.3* 98.8 100.8* 101.3*  --   --  102.2*  PLT 184 172 130* 181  --   --  355    Basic Metabolic Panel: Recent Labs  Lab 12/14/20 2051 12/15/20 0324 12/17/20 0847  NA 134* 135 133*  K 4.1 4.5 4.1  CL 101 101 98  CO2 25 25 28   GLUCOSE 201* 132* 186*  BUN 12 13 15   CREATININE  1.03* 0.86 1.07*  CALCIUM 8.1* 8.3* 8.3*    GFR: Estimated Creatinine Clearance: 56.2 mL/min (Lachelle Rissler) (by C-G formula based on SCr of 1.07 mg/dL (H)).  Liver Function Tests: Recent Labs  Lab 12/17/20 0847  AST 12*  ALT 11  ALKPHOS 58  BILITOT 1.2  PROT 5.8*  ALBUMIN 2.4*    CBG: No results for input(s): GLUCAP in the last 168 hours.   Recent Results (from the past 240 hour(s))  Resp Panel by RT-PCR (Flu Rainelle Sulewski&B, Covid) Nasopharyngeal Swab     Status: None   Collection Time: 12/14/20  4:38 PM   Specimen: Nasopharyngeal Swab; Nasopharyngeal(NP) swabs in vial transport medium  Result Value Ref Range Status   SARS Coronavirus 2 by RT PCR NEGATIVE NEGATIVE Final    Comment: (NOTE) SARS-CoV-2 target nucleic acids are NOT DETECTED.  The SARS-CoV-2 RNA is generally detectable in upper respiratory specimens during the acute phase of infection. The lowest concentration of SARS-CoV-2 viral copies this assay can detect is 138 copies/mL. Alexx Mcburney negative result does not preclude SARS-Cov-2 infection and should not be used as the sole basis for treatment or other patient management decisions. Jenalee Trevizo negative result may occur with  improper specimen collection/handling, submission of specimen other than nasopharyngeal swab, presence of viral mutation(s) within the areas targeted by this assay, and inadequate number of viral copies(<138 copies/mL). Edwards Mckelvie negative result must be combined with clinical observations, patient history, and epidemiological information. The expected result is Negative.  Fact Sheet for Patients:  EntrepreneurPulse.com.au  Fact Sheet for Healthcare Providers:  IncredibleEmployment.be  This test is no t yet approved or cleared by the [place] FDA and  has been authorized for detection and/or diagnosis of SARS-CoV-2 by FDA under an Emergency Use Authorization (EUA). This EUA will remain  in effect (meaning this test can be used) for the  duration of the COVID-19 declaration under Section 564(b)(1) of the Act, 21 U.S.C.section 360bbb-3(b)(1), unless the authorization is terminated  or revoked sooner.       Influenza Sameul Tagle by PCR NEGATIVE NEGATIVE Final   Influenza B by PCR NEGATIVE NEGATIVE Final    Comment: (NOTE) The Xpert Xpress SARS-CoV-2/FLU/RSV plus assay is intended as an aid in the diagnosis of influenza from Nasopharyngeal swab specimens and should not be used as Tiffaney Heimann sole basis for treatment. Nasal washings and aspirates are unacceptable for Xpert Xpress SARS-CoV-2/FLU/RSV testing.  Fact Sheet for Patients: EntrepreneurPulse.com.au  Fact Sheet for Healthcare Providers: IncredibleEmployment.be  This test is not yet approved or cleared by the [place] FDA and has been authorized for detection and/or diagnosis of  SARS-CoV-2 by FDA under an Emergency Use Authorization (EUA). This EUA will remain in effect (meaning this test can be used) for the duration of the COVID-19 declaration under Section 564(b)(1) of the Act, 21 U.S.C. section 360bbb-3(b)(1), unless the authorization is terminated or revoked.  Performed at [facility], [place] [address], [place], [place] [address]          Radiology Studies: No results found.      Scheduled Meds:  calcium-vitamin D   Oral BID   cholecalciferol  2,000 Units Oral Daily   enoxaparin (LOVENOX) injection  110 mg Subcutaneous Q12H   FLUoxetine  20 mg Oral Daily   furosemide  40 mg Oral Daily   metoprolol tartrate  25 mg Oral BID   potassium chloride  10 mEq Oral Daily   sodium chloride flush  3 mL Intravenous Q12H   warfarin  7.5 mg Oral Once   [START ON 12/18/2020] Warfarin - Pharmacist Dosing Inpatient   Does not apply q1600   Continuous Infusions:  sodium chloride     sodium chloride       LOS: 2 days    Time spent: over 30 min    Fayrene Helper, MD [facility]   To contact the attending  provider between 7A-7P or the covering provider during after hours 7P-7A, please log into the web site [email or web address] and access using universal [place] password for that web site. If you do not have the password, please call the hospital operator.  12/17/2020, 7:36 PM

## 2020-12-17 NOTE — Progress Notes (Signed)
Physical Therapy Treatment Patient Details Name: [name] MRN: [id number] DOB: 04/20/1945 Today's Date: 12/17/2020   History of Present Illness 75 y.o. female presents to [facility] ED on 12/14/2020 with bleeding from wound, recently undergoing L thigh tumor excision on 9/14. Pt admitted for management of postoperative complication symptomatic acute blood loss anemia. PMH includes atrial fibrillation on warfarin, history of PE and DVT.    PT Comments    Pt mentioned having pain in L thigh with activity and feeling weak at start of session. Performed bed mobility and transfers with similar assist compared to previous session (min A). Pt's ambulation distance decreased from previous session and pt declined stair training or further increasing gait due to fatigue. Given pt's functional status, continue to recommend HHPT upon d/c.   Recommendations for follow up therapy are one component of a multi-disciplinary discharge planning process, led by the attending physician.  Recommendations may be updated based on patient status, additional functional criteria and insurance authorization.  Follow Up Recommendations  Home health PT;Supervision for mobility/OOB     Equipment Recommendations  None recommended by PT    Recommendations for Other Services       Precautions / Restrictions Precautions Precautions: Fall Precaution Comments: L thigh wound Restrictions Weight Bearing Restrictions: No     Mobility  Bed Mobility Overal bed mobility: Needs Assistance Bed Mobility: Supine to Sit     Supine to sit: HOB elevated;Min assist     General bed mobility comments: Min A elevate trunk. Pt navigated LEs off EOB well. Performed with increased time. Able to scoot hips forward without assist Patient Response: Cooperative  Transfers Overall transfer level: Needs assistance Equipment used: Rolling walker (2 wheeled) Transfers: Sit to/from Stand Sit to Stand: Min assist         General  transfer comment: Min A to boost to stand and steady. Pt stated feeling weak when standing  Ambulation/Gait Ambulation/Gait assistance: Min guard Gait Distance (Feet): 20 Feet Assistive device: Rolling walker (2 wheeled) Gait Pattern/deviations: Trunk flexed;Decreased stance time - right;Step-through pattern;Decreased stride length;Wide base of support Gait velocity: Decreased Gait velocity interpretation: <1.8 ft/sec, indicate of risk for recurrent falls General Gait Details: Pt demonstrated step through pattern with wide base of support and incresed trunk flexion. Pt stated feeling very weak. Verbal cues to maintain upright posture   Stairs             Wheelchair Mobility    Modified Rankin (Stroke Patients Only)       Balance Overall balance assessment: Needs assistance Sitting-balance support: Feet unsupported;Single extremity supported Sitting balance-Leahy Scale: Good     Standing balance support: Bilateral upper extremity supported;During functional activity Standing balance-Leahy Scale: Poor Standing balance comment: Require BUE on RW                            Cognition Arousal/Alertness: Awake/alert Behavior During Therapy: WFL for tasks assessed/performed Overall Cognitive Status: Within Functional Limits for tasks assessed                                        Exercises      General Comments General comments (skin integrity, edema, etc.): VSS on RA      Pertinent Vitals/Pain Faces Pain Scale: Hurts a little bit Pain Location: L thigh with movement Pain Descriptors / Indicators: Grimacing Pain Intervention(s):  Monitored during session    Home Living                      Prior Function            PT Goals (current goals can now be found in the care plan section) Progress towards PT goals: Not progressing toward goals - comment (Pt limited by fatigue during session and declined stair training and increasing  gait)    Frequency    Min 3X/week      PT Plan Current plan remains appropriate    Co-evaluation              AM-PAC PT "6 Clicks" Mobility   Outcome Measure  Help needed turning from your back to your side while in a flat bed without using bedrails?: A Little Help needed moving from lying on your back to sitting on the side of a flat bed without using bedrails?: A Little Help needed moving to and from a bed to a chair (including a wheelchair)?: A Little Help needed standing up from a chair using your arms (e.g., wheelchair or bedside chair)?: A Little Help needed to walk in hospital room?: A Little Help needed climbing 3-5 steps with a railing? : A Lot 6 Click Score: 17    End of Session Equipment Utilized During Treatment: Gait belt Activity Tolerance: Patient limited by fatigue Patient left: in chair;with call bell/phone within reach;with chair alarm set;with nursing/sitter in room Nurse Communication: Mobility status PT Visit Diagnosis: Other abnormalities of gait and mobility (R26.89);Muscle weakness (generalized) (M62.81)     Time: 3559-7416 PT Time Calculation (min) (ACUTE ONLY): 19 min  Charges:  $Gait Training: 8-22 mins                     Louie Casa, SPT Acute Rehab: [phone]    Domingo Dimes 12/17/2020, 1:31 PM

## 2020-12-17 NOTE — TOC Initial Note (Signed)
Transition of Care [facility]) - Initial/Assessment Note    Patient Details  Name: [name] MRN: [id number] Date of Birth: 10/18/1945  [facility]) CM/SW Contact:    Bethena Roys, RN Phone Number: 12/17/2020, 2:26 PM  Clinical Narrative:  Case Manager spoke with the daughter regarding disposition needs for the patient. Prior to arrival patient was staying with her daughter at [place] [address]. The patient will return to this address and she is currently active with [facility] for RN, PT, OT -will add an aide for bath. Referral sent to [facility] and start of care to begin within 24-48 hours post transition home. Patient has durable medical equipment cane and rolling walker. Case Manager will follow for orders.           Expected Discharge Plan: [place] Barriers to Discharge: Continued Medical Work up   Patient Goals and CMS Choice Patient states their goals for this hospitalization and ongoing recovery are:: to return to daughters house.   Choice offered to / list presented to : Adult Children  Expected Discharge Plan and Services Expected Discharge Plan: [place] In-house Referral: NA Discharge Planning Services: CM Consult Post Acute Care Choice: Home Health Living arrangements for the past 2 months: Single Family Home                 DME Arranged: N/A DME Agency: NA      Prior Living Arrangements/Services Living arrangements for the past 2 months: Single Family Home Lives with:: Adult Children (staying with daughter post surgery.) Patient language and need for interpreter reviewed:: Yes Do you feel safe going back to the place where you live?: Yes      Need for Family Participation in Patient Care: Yes (Comment) Care giver support system in place?: Yes (comment) Current home services: DME (Patient has cane and rolling walker)    Activities of Daily  Living Home Assistive Devices/Equipment: Walker (specify type) ADL Screening (condition at time of admission) Patient's cognitive ability adequate to safely complete daily activities?: Yes Is the patient deaf or have difficulty hearing?: No Does the patient have difficulty seeing, even when wearing glasses/contacts?: No Does the patient have difficulty concentrating, remembering, or making decisions?: No Patient able to express need for assistance with ADLs?: Yes Does the patient have difficulty dressing or bathing?: No Independently performs ADLs?: Yes (appropriate for developmental age) Does the patient have difficulty walking or climbing stairs?: Yes Weakness of Legs: Left Weakness of Arms/Hands: None  Permission Sought/Granted Permission sought to share information with : Family Supports Permission granted to share information with : Yes, Verbal Permission Granted     Permission granted to share info w AGENCY: [facility]        Emotional Assessment   Attitude/Demeanor/Rapport: Engaged Affect (typically observed): Appropriate Orientation: : Oriented to  Time, Oriented to Place, Oriented to Situation Alcohol / Substance Use: Not Applicable Psych Involvement: No (comment)  Admission diagnosis:  Bleeding from wound [T14.8XXA] Anticoagulated on Coumadin [Z79.01] Symptomatic anemia [D64.9] Disruption of surgical wound [T81.31XA] Acute blood loss anemia [D62] Patient Active Problem List   Diagnosis Date Noted   Acute blood loss anemia 12/15/2020   Lupus anticoagulant syndrome ([place]) 12/15/2020   Bleeding from wound 12/14/2020   Symptomatic anemia 12/14/2020   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, left    Acute on chronic systolic HF (heart failure) ([place]) 09/28/2020  Coccyx pain    CHF (congestive heart failure) ([place]) 09/04/2020   Acute respiratory failure with hypoxia ([place]) 09/04/2020   Fall at home, initial encounter 09/04/2020   Mitral valve stenosis     Essential hypertension 06/27/2017   History of lupus anticoagulant disorder 03/31/2017   NICM (nonischemic cardiomyopathy) ([place]) 03/31/2017   Streptococcal bacteremia    History of MRSA infection    Wound infection 03/24/2017   Cellulitis of right lower extremity 03/24/2017   Sepsis due to cellulitis ([place]) 03/04/2017   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 [place] ([place]) [place] [address] Phone: [phone] Fax: [phone]  Readmission Risk Interventions No flowsheet data found.

## 2020-12-17 NOTE — Progress Notes (Signed)
[facility] for Lovenox / Warfarin Indication: history of PE/DVT, history of CVA/Afib, Lupus anticoagulant   Allergies  Allergen Reactions   Heparin Hives    Patient attests severe allergy- rash all over, severe itching, unable to take heparin   Other Itching and Rash    "Sheets and Linens used by [name]"    Patient Measurements: Height: 5\' 5"  (165.1 cm) Weight: 110.2 kg (243 lb) IBW/kg (Calculated) : 57  Vital Signs: Temp: 98.9 F (37.2 C) (09/22 1229) Temp Source: Oral (09/22 1229) BP: 135/51 (09/22 1229) Pulse Rate: 100 (09/22 1229)  Labs: Recent Labs    12/14/20 2051 12/15/20 0324 12/15/20 0324 12/15/20 0925 12/16/20 0223 12/16/20 1250 12/16/20 2030 12/17/20 0023 12/17/20 0847  HGB  --  8.1*   < > 8.0* 7.8* 7.5* 7.7*  --  7.5*  HCT  --  24.8*   < > 24.8* 24.2* 23.5* 24.5*  --  23.1*  PLT  --  172   < > 130* 181  --   --   --  226  LABPROT  --   --   --  18.3* 17.7*  --   --  15.8*  --   INR  --   --   --  1.5* 1.5*  --   --  1.3*  --   CREATININE 1.03* 0.86  --   --   --   --   --   --  1.07*   < > = values in this interval not displayed.     Estimated Creatinine Clearance: 56.2 mL/min (A) (by C-G formula based on SCr of 1.07 mg/dL (H)).   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 INR 1.7 on admission Was on LMWH post-op until admission for bleeding 9/19.  Has heparin allergy but not HIT  Resuming warfarin 9/22 - > INR 1.3  Goal of Therapy:  Anti-Xa level 0.6-1 units/ml 4hrs after LMWH dose given Monitor platelets by anticoagulation protocol: Yes    Plan:  Continue Lovenox 110mg /12h hrs. Warfarin 7.5 mg po x 1 dose tonight Daily INR  Thank you Anette Guarneri, PharmD  12/17/2020,7:09 PM

## 2020-12-17 NOTE — Plan of Care (Signed)

## 2020-12-17 NOTE — Plan of Care (Signed)

## 2020-12-18 ENCOUNTER — Inpatient Hospital Stay (HOSPITAL_COMMUNITY): Payer: Medicare Other

## 2020-12-18 DIAGNOSIS — D649 Anemia, unspecified: Secondary | ICD-10-CM | POA: Diagnosis not present

## 2020-12-18 LAB — COMPREHENSIVE METABOLIC PANEL
ALT: 10 U/L (ref 0–44)
AST: 14 U/L — ABNORMAL LOW (ref 15–41)
Albumin: 2.5 g/dL — ABNORMAL LOW (ref 3.5–5.0)
Alkaline Phosphatase: 58 U/L (ref 38–126)
Anion gap: 7 (ref 5–15)
BUN: 13 mg/dL (ref 8–23)
CO2: 29 mmol/L (ref 22–32)
Calcium: 8.4 mg/dL — ABNORMAL LOW (ref 8.9–10.3)
Chloride: 100 mmol/L (ref 98–111)
Creatinine, Ser: 1 mg/dL (ref 0.44–1.00)
GFR, Estimated: 59 mL/min — ABNORMAL LOW (ref >60)
Glucose, Bld: 98 mg/dL (ref 70–99)
Potassium: 3.7 mmol/L (ref 3.5–5.1)
Sodium: 136 mmol/L (ref 135–145)
Total Bilirubin: 1.5 mg/dL — ABNORMAL HIGH (ref 0.3–1.2)
Total Protein: 5.9 g/dL — ABNORMAL LOW (ref 6.5–8.1)

## 2020-12-18 LAB — CBC WITH DIFFERENTIAL/PLATELET
Abs Immature Granulocytes: 0.04 10*3/uL (ref 0.00–0.07)
Basophils Absolute: 0 10*3/uL (ref 0.0–0.1)
Basophils Relative: 1 %
Eosinophils Absolute: 0.3 10*3/uL (ref 0.0–0.5)
Eosinophils Relative: 5 %
HCT: 24.6 % — ABNORMAL LOW (ref 36.0–46.0)
Hemoglobin: 7.8 g/dL — ABNORMAL LOW (ref 12.0–15.0)
Immature Granulocytes: 1 %
Lymphocytes Relative: 18 %
Lymphs Abs: 1.3 10*3/uL (ref 0.7–4.0)
MCH: 32.2 pg (ref 26.0–34.0)
MCHC: 31.7 g/dL (ref 30.0–36.0)
MCV: 101.7 fL — ABNORMAL HIGH (ref 80.0–100.0)
Monocytes Absolute: 0.9 10*3/uL (ref 0.1–1.0)
Monocytes Relative: 13 %
Neutro Abs: 4.3 10*3/uL (ref 1.7–7.7)
Neutrophils Relative %: 62 %
Platelets: 232 10*3/uL (ref 150–400)
RBC: 2.42 MIL/uL — ABNORMAL LOW (ref 3.87–5.11)
RDW: 15.7 % — ABNORMAL HIGH (ref 11.5–15.5)
WBC: 6.9 10*3/uL (ref 4.0–10.5)
nRBC: 0.3 % — ABNORMAL HIGH (ref 0.0–0.2)

## 2020-12-18 LAB — PROTIME-INR
INR: 1.3 — ABNORMAL HIGH (ref 0.8–1.2)
Prothrombin Time: 16.4 seconds — ABNORMAL HIGH (ref 11.4–15.2)

## 2020-12-18 LAB — MAGNESIUM: Magnesium: 1.9 mg/dL (ref 1.7–2.4)

## 2020-12-18 LAB — PHOSPHORUS: Phosphorus: 3.7 mg/dL (ref 2.5–4.6)

## 2020-12-18 MED ORDER — FLUTICASONE PROPIONATE 50 MCG/ACT NA SUSP
2.0000 | Freq: Every day | NASAL | Status: DC
  Administered 2020-12-18 – 2020-12-23 (×6): 2 via NASAL
  Filled 2020-12-18: qty 16

## 2020-12-18 MED ORDER — WARFARIN SODIUM 4 MG PO TABS
4.5000 mg | ORAL_TABLET | Freq: Once | ORAL | Status: AC
  Administered 2020-12-18: 4.5 mg via ORAL
  Filled 2020-12-18: qty 1

## 2020-12-18 NOTE — Plan of Care (Signed)
  Problem: Health Behavior/Discharge Planning: Goal: Ability to manage health-related needs will improve Outcome: Progressing   Problem: Clinical Measurements: Goal: Ability to maintain clinical measurements within normal limits will improve Outcome: Progressing Goal: Will remain free from infection Outcome: Progressing   

## 2020-12-18 NOTE — Progress Notes (Signed)
[place] for Lovenox / Coumadin Indication: history of PE/DVT, history of CVA/Afib, Lupus anticoagulant   Allergies  Allergen Reactions   Heparin Hives    Patient attests severe allergy- rash all over, severe itching, unable to take heparin   Other Itching and Rash    "Sheets and Linens used by Zacarias Pontes"    Patient Measurements: Height: 5\' 5"  (165.1 cm) Weight: 110.2 kg (243 lb) IBW/kg (Calculated) : 57  Vital Signs: Temp: 98.9 F (37.2 C) (09/22 2003) Temp Source: Oral (09/22 2003) BP: 122/70 (09/22 2003) Pulse Rate: 106 (09/22 2003)  Labs: Recent Labs    12/16/20 0223 12/16/20 1250 12/16/20 2030 12/17/20 0023 12/17/20 0847 12/18/20 0133  HGB 7.8*   < > 7.7*  --  7.5* 7.8*  HCT 24.2*   < > 24.5*  --  23.1* 24.6*  PLT 181  --   --   --  226 232  LABPROT 17.7*  --   --  15.8*  --  16.4*  INR 1.5*  --   --  1.3*  --  1.3*  CREATININE  --   --   --   --  1.07* 1.00   < > = values in this interval not displayed.    Estimated Creatinine Clearance: 60.1 mL/min (by C-G formula based on SCr of 1 mg/dL).   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 INR 1.7 on admission Was on LMWH post-op until admission for bleeding 9/19.  Has heparin allergy but not HIT  Resumed Coumadin 9/22 - > INR 1.3, expect to see rise tomorrow.  Hgb 7's-stable, Platelets within normal limits.   Home regimen: Coumadin 4.5mg  daily except 6mg  on Wednesdays.   Goal of Therapy:  Anti-Xa level 0.6-1 units/ml 4hrs after LMWH dose given Monitor platelets by anticoagulation protocol: Yes    Plan:  Continue Lovenox 110mg /12h hrs. Coumadin 4.5 mg po x 1 dose tonight Daily INR  Thank you Sloan Leiter, PharmD, BCPS, BCCCP Clinical Pharmacist Clinical phone 12/18/2020 until 3PM [phone] Please refer to [facility] for [place] numbers 12/18/2020,8:01 AM

## 2020-12-18 NOTE — Progress Notes (Signed)
PROGRESS NOTE    ELLOUISE MCWHIRTER  ZYS:[id number] DOB: Jan 02, 1946 DOA: 12/14/2020 PCP: Josetta Huddle, MD   Chief Complaint  Patient presents with   Follow-up   Brief Narrative 75 yo with hx atrial fibrillation on warfarin, hx PT/DVT, hx lupus anticoagulant, with recent surgery with Dr. Sharol Given for the excision of [place] left thigh mass represents with acute blood loss anemia and post operative bleeding.  She was on lovenox bridge with warfarin at the time of admission.  See below for additional details   Assessment & Plan:   Principal Problem:   Symptomatic anemia Active Problems:   History of pulmonary embolism   Permanent atrial fibrillation (HCC)   History of CVA (cerebrovascular accident)   Essential hypertension   CHF (congestive heart failure) (HCC)   Bleeding from wound   Acute blood loss anemia   Lupus anticoagulant syndrome (HCC)  Acute Blood Loss Anemia  Post Operative Blood Loss - Hb 9/7 12.3 - hb 9/19 7.8 at presentation - s/p 2 units pRBC with inappropriate response to ~8 - Hb 7.8 today - will continue to trend H/H for now - transfuse for symptomatic or <7 - doesn't appear symptomatic at this time - continue lovenox for now, start warfarin - given her complexity and need for lovenox bridge in setting of her risk for clotting - with hemodynamically significant bleed prior to admission, will maintain inpatient for now - some mild oozing from her L leg, very mild, will continue to monitor - appreciate orthopedics c/s - wrap thigh with ace wrap   Lupus anticoagulant  Hx VTE (DVTs, biliateral Pe's)  Hx IVC Filter  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 in 11/3233  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   Difficult to place patient No       Consultants:  orthopedics  Procedures:  Excision soft tissue mass L thigh 9/14  Antimicrobials:  Anti-infectives (From admission, onward)    None          Subjective: No complaints She and daughter rather anxious about wound and bleeding  Objective: Vitals:   12/17/20 1229 12/17/20 2003 12/18/20 0833 12/18/20 1252  BP: (!) 135/51 122/70 133/73 (!) 113/59  Pulse: 100 (!) 106 86 77  Resp: 19 16 17 18   Temp: 98.9 F (37.2 C) 98.9 F (37.2 C) 98.2 F (36.8 C) 98.2 F (36.8 C)  TempSrc: Oral Oral Oral Oral  SpO2: 97% 97% 96% 90%  Weight:      Height:        Intake/Output Summary (Last 24 hours) at 12/18/2020 1657 Last data filed at 12/18/2020 0800 Gross per 24 hour  Intake 480 ml  Output 1200 ml  Net -720 ml   Filed Weights   12/14/20 1234  Weight: 110.2 kg  Examination:  General: No acute distress. Cardiovascular: RRR Lungs: unlabored, CTAB - cough  Abdomen: Soft, nontender, nondistended  Neurological: Alert and oriented 3. Moves all extremities 4 . Cranial nerves II through XII grossly intact. Skin: Warm and dry. No rashes or lesions. Extremities: L thigh swollen, very mild oozing noted from wound      Data Reviewed: I have personally reviewed following labs and imaging studies  CBC: Recent Labs  Lab 12/14/20 1231 12/15/20 0324 12/15/20 0925 12/16/20 0223 12/16/20 1250 12/16/20 2030 12/17/20 0847 12/18/20 0133  WBC 9.9 10.6* 10.9* 9.2  --   --  10.3 6.9  NEUTROABS 7.4  --   --   --   --   --  8.0* 4.3  HGB 7.8* 8.1*  8.0* 7.8* 7.5* 7.7* 7.5* 7.8*  HCT 25.3* 24.8* 24.8* 24.2* 23.5* 24.5* 23.1* 24.6*  MCV 106.3* 98.8 100.8* 101.3*  --   --  102.2* 101.7*  PLT 184 172 130* 181  --   --  226 638    Basic Metabolic Panel: Recent Labs  Lab 12/14/20 2051 12/15/20 0324 12/17/20 0847 12/18/20 0133  NA 134* 135 133* 136  K 4.1 4.5 4.1 3.7  CL 101 101 98 100  CO2 25 25 28 29   GLUCOSE 201* 132* 186* 98  BUN 12 13 15 13   CREATININE 1.03* 0.86 1.07* 1.00  CALCIUM 8.1* 8.3* 8.3* 8.4*  MG  --   --   --  1.9  PHOS  --   --   --  3.7    GFR: Estimated Creatinine Clearance: 60.1 mL/min (by C-G formula based on SCr of 1 mg/dL).  Liver Function Tests: Recent Labs  Lab 12/17/20 0847 12/18/20 0133  AST 12* 14*  ALT 11 10  ALKPHOS 58 58  BILITOT 1.2 1.5*  PROT 5.8* 5.9*  ALBUMIN 2.4* 2.5*    CBG: No results for input(s): GLUCAP in the last 168 hours.   Recent Results (from the past 240 hour(s))  Resp Panel by RT-PCR (Flu Yari Szeliga&B, Covid) Nasopharyngeal Swab     Status: None   Collection Time: 12/14/20  4:38 PM   Specimen: Nasopharyngeal Swab; Nasopharyngeal(NP) swabs in vial transport medium  Result Value Ref Range Status   SARS Coronavirus 2 by RT PCR NEGATIVE NEGATIVE Final    Comment: (NOTE) SARS-CoV-2 target nucleic acids are NOT DETECTED.  The SARS-CoV-2 RNA is generally detectable in upper respiratory specimens during the acute phase of infection. The lowest concentration of SARS-CoV-2 viral copies this assay can detect is 138 copies/mL. Paislee Szatkowski negative result does not preclude SARS-Cov-2 infection and should not be used as the sole basis for treatment or other patient management decisions. Captain Blucher negative result may occur with  improper specimen collection/handling, submission of specimen other than nasopharyngeal swab, presence of viral mutation(s) within the areas targeted by this assay, and inadequate number of viral copies(<138 copies/mL). Alazia Crocket negative result must be combined with clinical  observations, patient history, and epidemiological information. The expected result is Negative.  Fact Sheet for Patients:  EntrepreneurPulse.com.au  Fact Sheet for Healthcare Providers:  IncredibleEmployment.be  This test is no t yet approved or cleared by the [place] FDA and  has been authorized for detection and/or diagnosis of SARS-CoV-2 by FDA under an Emergency Use Authorization (EUA). This EUA will remain  in effect (meaning this test can be used) for the duration of the COVID-19 declaration under Section 564(b)(1) of the Act, 21 U.S.C.section 360bbb-3(b)(1), unless the authorization is terminated  or revoked sooner.       Influenza Areebah Meinders by PCR NEGATIVE NEGATIVE Final   Influenza B by PCR NEGATIVE NEGATIVE Final    Comment: (NOTE) The Xpert Xpress SARS-CoV-2/FLU/RSV plus assay is intended as an aid in the diagnosis of influenza from Nasopharyngeal swab specimens and should not be used as Catie Chiao sole basis for treatment. Nasal washings and aspirates are unacceptable for Xpert Xpress SARS-CoV-2/FLU/RSV testing.  Fact Sheet for Patients: EntrepreneurPulse.com.au  Fact Sheet for Healthcare Providers: IncredibleEmployment.be  This test is not yet approved or cleared by the [place] FDA and has been authorized for detection and/or diagnosis of SARS-CoV-2 by FDA under an Emergency Use Authorization (EUA). This EUA will remain in effect (meaning this test can be used) for the duration of the COVID-19 declaration under Section 564(b)(1) of the Act, 21 U.S.C. section 360bbb-3(b)(1), unless the authorization is terminated or revoked.  Performed at [facility], [place] [address] [place],  [address]          Radiology Studies: No results found.      Scheduled Meds:  calcium-vitamin D   Oral BID   cholecalciferol  2,000 Units Oral Daily   enoxaparin (LOVENOX) injection  110 mg  Subcutaneous Q12H   FLUoxetine  20 mg Oral Daily   furosemide  40 mg Oral Daily   metoprolol tartrate  25 mg Oral BID   potassium chloride  10 mEq Oral Daily   sodium chloride flush  3 mL Intravenous Q12H   Warfarin - Pharmacist Dosing Inpatient   Does not apply q1600   Continuous Infusions:  sodium chloride     sodium chloride       LOS: 3 days    Time spent: over 30 min    Fayrene Helper, MD [facility]   To contact the attending provider between 7A-7P or the covering provider during after hours 7P-7A, please log into the web site [email or web address] and access using universal [place] password for that web site. If you do not have the password, please call the hospital operator.  12/18/2020, 4:57 PM

## 2020-12-18 NOTE — Plan of Care (Signed)

## 2020-12-18 NOTE — Care Management Important Message (Signed)
Important Message  Patient Details  Name: KARIANNE NOGUEIRA MRN: [id number] Date of Birth: [birth year]   Medicare Important Message Given:  Yes     Reco Shonk 12/18/2020, 4:00 PM

## 2020-12-19 DIAGNOSIS — D649 Anemia, unspecified: Secondary | ICD-10-CM | POA: Diagnosis not present

## 2020-12-19 LAB — COMPREHENSIVE METABOLIC PANEL
ALT: 9 U/L (ref 0–44)
AST: 14 U/L — ABNORMAL LOW (ref 15–41)
Albumin: 2.5 g/dL — ABNORMAL LOW (ref 3.5–5.0)
Alkaline Phosphatase: 61 U/L (ref 38–126)
Anion gap: 7 (ref 5–15)
BUN: 14 mg/dL (ref 8–23)
CO2: 30 mmol/L (ref 22–32)
Calcium: 8.6 mg/dL — ABNORMAL LOW (ref 8.9–10.3)
Chloride: 101 mmol/L (ref 98–111)
Creatinine, Ser: 0.98 mg/dL (ref 0.44–1.00)
GFR, Estimated: >60 mL/min (ref >60)
Glucose, Bld: 102 mg/dL — ABNORMAL HIGH (ref 70–99)
Potassium: 3.7 mmol/L (ref 3.5–5.1)
Sodium: 138 mmol/L (ref 135–145)
Total Bilirubin: 1.1 mg/dL (ref 0.3–1.2)
Total Protein: 6.2 g/dL — ABNORMAL LOW (ref 6.5–8.1)

## 2020-12-19 LAB — MAGNESIUM: Magnesium: 1.9 mg/dL (ref 1.7–2.4)

## 2020-12-19 LAB — CBC
HCT: 25 % — ABNORMAL LOW (ref 36.0–46.0)
Hemoglobin: 7.9 g/dL — ABNORMAL LOW (ref 12.0–15.0)
MCH: 32.2 pg (ref 26.0–34.0)
MCHC: 31.6 g/dL (ref 30.0–36.0)
MCV: 102 fL — ABNORMAL HIGH (ref 80.0–100.0)
Platelets: 262 10*3/uL (ref 150–400)
RBC: 2.45 MIL/uL — ABNORMAL LOW (ref 3.87–5.11)
RDW: 15.6 % — ABNORMAL HIGH (ref 11.5–15.5)
WBC: 7.1 10*3/uL (ref 4.0–10.5)
nRBC: 0 % (ref 0.0–0.2)

## 2020-12-19 LAB — PROTIME-INR
INR: >10 (ref 0.8–1.2)
INR: 1.5 — ABNORMAL HIGH (ref 0.8–1.2)
Prothrombin Time: >90 seconds — ABNORMAL HIGH (ref 11.4–15.2)
Prothrombin Time: 18.1 seconds — ABNORMAL HIGH (ref 11.4–15.2)

## 2020-12-19 LAB — PHOSPHORUS: Phosphorus: 3.6 mg/dL (ref 2.5–4.6)

## 2020-12-19 MED ORDER — WARFARIN SODIUM 4 MG PO TABS
4.5000 mg | ORAL_TABLET | Freq: Once | ORAL | Status: AC
  Administered 2020-12-19: 4.5 mg via ORAL
  Filled 2020-12-19: qty 1

## 2020-12-19 MED ORDER — FERROUS SULFATE 325 (65 FE) MG PO TABS
325.0000 mg | ORAL_TABLET | Freq: Every day | ORAL | Status: DC
  Administered 2020-12-20 – 2020-12-23 (×4): 325 mg via ORAL
  Filled 2020-12-19 (×4): qty 1

## 2020-12-19 NOTE — Plan of Care (Signed)
  Problem: Pain Managment: Goal: General experience of comfort will improve Outcome: Progressing   Problem: Nutrition: Goal: Adequate nutrition will be maintained Outcome: Progressing   

## 2020-12-19 NOTE — Progress Notes (Addendum)
PROGRESS NOTE    [name]  RAQ:[id number] DOB: 12/09/1945 DOA: 12/14/2020 PCP: Josetta Huddle, MD   Chief Complaint  Patient presents with   Follow-up   Brief Narrative 75 yo with hx atrial fibrillation on warfarin, hx PT/DVT, hx lupus anticoagulant, with recent surgery with Dr. Sharol Given for the excision of [name] left thigh mass represents with acute blood loss anemia and post operative bleeding.  She was on lovenox bridge with warfarin at the time of admission.  See below for additional details   Assessment & Plan:   Principal Problem:   Symptomatic anemia Active Problems:   History of pulmonary embolism   Permanent atrial fibrillation (HCC)   History of CVA (cerebrovascular accident)   Essential hypertension   CHF (congestive heart failure) (HCC)   Bleeding from wound   Acute blood loss anemia   Lupus anticoagulant syndrome (HCC)  Acute Blood Loss Anemia  Post Operative Blood Loss - Hb 9/7 12.3 - hb 9/19 7.8 at presentation - s/p 2 units pRBC with inappropriate response to ~8 - Hb 7.9 today - will continue to trend H/H for now - transfuse for symptomatic or <7 - doesn't appear symptomatic at this time - continue lovenox for now, start warfarin - given her complexity and need for lovenox bridge in setting of her risk for clotting - with hemodynamically significant bleed prior to admission, will maintain inpatient for now - some mild oozing from her L leg, very mild, will continue to monitor - appreciate orthopedics c/s - wrap thigh with ace wrap  - 9/14 path from surgery - hematoma involving adipose tissue, traumatized lipoma?  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  - continue lovenox for now - will resume warfarin - negative HIT ab 01/2017  Hx DVT  PE - continue lovenox - will resume warfarin  Hx  CVA - noted, continue anticoagulation  Atrial fibrillation - continue warfarin  Cough - CXR negative, follow   DVT prophylaxis: lovenox Code Statusfull  Family Communication: daughter at bedside 9/23 Disposition:   Status is: Inpatient  Remains inpatient appropriate because:Inpatient level of care appropriate due to severity of illness  Dispo: The patient is from: Home              Anticipated d/c is to: Home              Patient currently is not medically stable to d/c.   Difficult to place patient No       Consultants:  orthopedics  Procedures:  Excision soft tissue mass L thigh 9/14  Antimicrobials:  Anti-infectives (From admission, onward)    None          Subjective: No complaints She and daughter rather anxious about wound and bleeding  Objective: Vitals:   12/18/20 0833 12/18/20 1252 12/18/20 2104 12/19/20 0812  BP: 133/73 (!) 113/59 120/68 107/73  Pulse: 86 77 90 79  Resp: 17 18 18 14   Temp: 98.2 F (36.8 C) 98.2 F (36.8 C) 98.8 F (37.1 C) 97.8 F (36.6 C)  TempSrc: Oral Oral  Oral  SpO2: 96% 90% 99% 99%  Weight:      Height:        Intake/Output Summary (Last 24 hours) at 12/19/2020 1341 Last data filed at 12/19/2020 0900 Gross per 24 hour  Intake 460 ml  Output --  Net 460 ml   Filed Weights   12/14/20 1234  Weight: 110.2 kg    Examination:  General: No acute distress. Cardiovascular: RRR Lungs: unlabored Abdomen: Soft, nontender, nondistended Neurological: Alert and oriented 3. Moves all extremities 4. Cranial nerves II through XII grossly intact. Extremities: L thigh swollen (soft compartments), oozing from surgical wound, minimal     Data Reviewed: I have personally reviewed following labs and imaging studies  CBC: Recent Labs  Lab 12/14/20 1231 12/15/20 0324 12/15/20 0925 12/16/20 0223 12/16/20 1250 12/16/20 2030 12/17/20 0847 12/18/20 0133 12/19/20 0414  WBC 9.9   < > 10.9* 9.2  --   --  10.3 6.9 7.1   NEUTROABS 7.4  --   --   --   --   --  8.0* 4.3  --   HGB 7.8*   < > 8.0* 7.8* 7.5* 7.7* 7.5* 7.8* 7.9*  HCT 25.3*   < > 24.8* 24.2* 23.5* 24.5* 23.1* 24.6* 25.0*  MCV 106.3*   < > 100.8* 101.3*  --   --  102.2* 101.7* 102.0*  PLT 184   < > 130* 181  --   --  226 232 262   < > = values in this interval not displayed.    Basic Metabolic Panel: Recent Labs  Lab 12/14/20 2051 12/15/20 0324 12/17/20 0847 12/18/20 0133 12/19/20 0604  NA 134* 135 133* 136 138  K 4.1 4.5 4.1 3.7 3.7  CL 101 101 98 100 101  CO2 25 25 28 29 30   GLUCOSE 201* 132* 186* 98 102*  BUN 12 13 15 13 14   CREATININE 1.03* 0.86 1.07* 1.00 0.98  CALCIUM 8.1* 8.3* 8.3* 8.4* 8.6*  MG  --   --   --  1.9 1.9  PHOS  --   --   --  3.7 3.6    GFR: Estimated Creatinine Clearance: 61.3 mL/min (by C-G formula based on SCr of 0.98 mg/dL).  Liver Function Tests: Recent Labs  Lab 12/17/20 0847 12/18/20 0133 12/19/20 0604  AST 12* 14* 14*  ALT 11 10 9   ALKPHOS 58 58 61  BILITOT 1.2 1.5* 1.1  PROT 5.8* 5.9* 6.2*  ALBUMIN 2.4* 2.5* 2.5*    CBG: No results for input(s): GLUCAP in the last 168 hours.   Recent Results (from the past 240 hour(s))  Resp Panel by RT-PCR (Flu [name]&B, Covid) Nasopharyngeal Swab     Status: None   Collection Time: 12/14/20  4:38 PM   Specimen: Nasopharyngeal Swab; Nasopharyngeal(NP) swabs in vial transport medium  Result Value Ref Range Status   SARS Coronavirus 2 by RT PCR NEGATIVE NEGATIVE Final    Comment: (NOTE) SARS-CoV-2 target nucleic acids are NOT DETECTED.  The SARS-CoV-2 RNA is generally detectable in upper respiratory specimens during the acute phase of infection. The lowest concentration of SARS-CoV-2 viral copies this assay can detect is 138 copies/mL. [name] negative result does not preclude SARS-Cov-2 infection and should not be used as the sole basis for treatment or other patient management decisions. [name] negative result may occur with  improper specimen  collection/handling, submission of specimen other than nasopharyngeal swab, presence of viral mutation(s) within the areas targeted by this assay, and inadequate number of viral copies(<138 copies/mL). [name] negative result must be combined with clinical observations, patient history, and epidemiological information. The expected result is Negative.  Fact Sheet for Patients:  EntrepreneurPulse.com.au  Fact Sheet for Healthcare Providers:  IncredibleEmployment.be  This test is no t yet approved or  cleared by the [place] and  has been authorized for detection and/or diagnosis of SARS-CoV-2 by FDA under an Emergency Use Authorization (EUA). This EUA will remain  in effect (meaning this test can be used) for the duration of the COVID-19 declaration under Section 564(b)(1) of the Act, 21 U.S.C.section 360bbb-3(b)(1), unless the authorization is terminated  or revoked sooner.       Influenza Julia Kulzer by PCR NEGATIVE NEGATIVE Final   Influenza B by PCR NEGATIVE NEGATIVE Final    Comment: (NOTE) The Xpert Xpress SARS-CoV-2/FLU/RSV plus assay is intended as an aid in the diagnosis of influenza from Nasopharyngeal swab specimens and should not be used as Londin Antone sole basis for treatment. Nasal washings and aspirates are unacceptable for Xpert Xpress SARS-CoV-2/FLU/RSV testing.  Fact Sheet for Patients: EntrepreneurPulse.com.au  Fact Sheet for Healthcare Providers: IncredibleEmployment.be  This test is not yet approved or cleared by the [place] FDA and has been authorized for detection and/or diagnosis of SARS-CoV-2 by FDA under an Emergency Use Authorization (EUA). This EUA will remain in effect (meaning this test can be used) for the duration of the COVID-19 declaration under Section 564(b)(1) of the Act, 21 U.S.C. section 360bbb-3(b)(1), unless the authorization is terminated or revoked.  Performed at [facility], [place] [address] [place], [place] [address]          Radiology Studies: DG CHEST PORT 1 VIEW  Result Date: 12/18/2020 CLINICAL DATA:  Cough EXAM: PORTABLE CHEST 1 VIEW COMPARISON:  09/03/2020 FINDINGS: Cardiomegaly. No focal opacity, pleural effusion, or pneumothorax. Aortic atherosclerosis. IMPRESSION: No active disease.  Cardiomegaly Electronically Signed   By: Donavan Foil M.D.   On: 12/18/2020 21:02        Scheduled Meds:  calcium-vitamin D   Oral BID   cholecalciferol  2,000 Units Oral Daily   enoxaparin (LOVENOX) injection  110 mg Subcutaneous Q12H   FLUoxetine  20 mg Oral Daily   fluticasone  2 spray Each Nare Daily   furosemide  40 mg Oral Daily   metoprolol tartrate  25 mg Oral BID   potassium chloride  10 mEq Oral Daily   sodium chloride flush  3 mL Intravenous Q12H   warfarin  4.5 mg Oral ONCE-1600   Warfarin - Pharmacist Dosing Inpatient   Does not apply q1600   Continuous Infusions:  sodium chloride     sodium chloride       LOS: 4 days    Time spent: over 30 min    Fayrene Helper, MD [facility]   To contact the attending provider between 7A-7P or the covering provider during after hours 7P-7A, please log into the web site [email or web address] and access using universal [place] password for that web site. If you do not have the password, please call the hospital operator.  12/19/2020, 1:41 PM

## 2020-12-19 NOTE — Progress Notes (Signed)
[place] for Lovenox / Coumadin Indication: history of PE/DVT, history of CVA/Afib, Lupus anticoagulant   Allergies  Allergen Reactions   Heparin Hives    Patient attests severe allergy- rash all over, severe itching, unable to take heparin   Other Itching and Rash    "Sheets and Linens used by Zacarias Pontes"    Patient Measurements: Height: 5\' 5"  (165.1 cm) Weight: 110.2 kg (243 lb) IBW/kg (Calculated) : 57  Vital Signs: Temp: 97.8 F (36.6 C) (09/24 0812) Temp Source: Oral (09/24 0812) BP: 107/73 (09/24 0812) Pulse Rate: 79 (09/24 0812)  Labs: Recent Labs    12/17/20 0847 12/18/20 0133 12/19/20 0414 12/19/20 0604 12/19/20 0745  HGB 7.5* 7.8* 7.9*  --   --   HCT 23.1* 24.6* 25.0*  --   --   PLT 226 232 262  --   --   LABPROT  --  16.4* >90.0*  --  18.1*  INR  --  1.3* >10.0*  --  1.5*  CREATININE 1.07* 1.00  --  0.98  --      Estimated Creatinine Clearance: 61.3 mL/min (by C-G formula based on SCr of 0.98 mg/dL).   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 INR 1.7 on admission Was on LMWH post-op until admission for bleeding 9/19.  Has heparin allergy but not HIT Home regimen: Coumadin 4.5mg  daily except 6mg  on Wednesdays. Resumed Coumadin 9/22. Repeat INR this morning is 1.5 trending up on PTA warfarin dosing. Hgb stable low and PLT wnl.   Goal of Therapy:  Anti-Xa level 0.6-1 units/ml 4hrs after LMWH dose given Monitor platelets by anticoagulation protocol: Yes INR Goal: 2.5-3.5 per previous anticoagulation notes   Plan:  Continue Lovenox 110mg /12h  hrs. Coumadin 4.5 mg po x 1 dose tonight Daily INR  Cephus Slater, PharmD, [place] Resident [phone] 12/19/2020 9:11 AM

## 2020-12-20 DIAGNOSIS — D649 Anemia, unspecified: Secondary | ICD-10-CM | POA: Diagnosis not present

## 2020-12-20 LAB — CBC
HCT: 26.1 % — ABNORMAL LOW (ref 36.0–46.0)
Hemoglobin: 8.3 g/dL — ABNORMAL LOW (ref 12.0–15.0)
MCH: 32.7 pg (ref 26.0–34.0)
MCHC: 31.8 g/dL (ref 30.0–36.0)
MCV: 102.8 fL — ABNORMAL HIGH (ref 80.0–100.0)
Platelets: 292 10*3/uL (ref 150–400)
RBC: 2.54 MIL/uL — ABNORMAL LOW (ref 3.87–5.11)
RDW: 15.8 % — ABNORMAL HIGH (ref 11.5–15.5)
WBC: 8.7 10*3/uL (ref 4.0–10.5)
nRBC: 0 % (ref 0.0–0.2)

## 2020-12-20 LAB — PROTIME-INR
INR: 1.7 — ABNORMAL HIGH (ref 0.8–1.2)
Prothrombin Time: 19.5 seconds — ABNORMAL HIGH (ref 11.4–15.2)

## 2020-12-20 MED ORDER — BENZONATATE 100 MG PO CAPS
100.0000 mg | ORAL_CAPSULE | Freq: Three times a day (TID) | ORAL | Status: DC | PRN
  Administered 2020-12-20 – 2020-12-21 (×2): 100 mg via ORAL
  Filled 2020-12-20 (×2): qty 1

## 2020-12-20 MED ORDER — WARFARIN SODIUM 6 MG PO TABS: 6.0000 mg | ORAL_TABLET | Freq: Once | ORAL | Status: DC

## 2020-12-20 MED ORDER — WARFARIN SODIUM 4 MG PO TABS: 4.5000 mg | ORAL_TABLET | Freq: Once | ORAL | Status: DC

## 2020-12-20 MED ORDER — WARFARIN SODIUM 5 MG PO TABS
5.0000 mg | ORAL_TABLET | Freq: Once | ORAL | Status: AC
  Administered 2020-12-20: 5 mg via ORAL
  Filled 2020-12-20: qty 1

## 2020-12-20 NOTE — Progress Notes (Addendum)
[place] for Lovenox / Coumadin Indication: history of PE/DVT, history of CVA/Afib, Lupus anticoagulant   Allergies  Allergen Reactions   Heparin Hives    Patient attests severe allergy- rash all over, severe itching, unable to take heparin   Other Itching and Rash    "Sheets and Linens used by Zacarias Pontes"    Patient Measurements: Height: 5\' 5"  (165.1 cm) Weight: 110.2 kg (243 lb) IBW/kg (Calculated) : 57  Vital Signs: Temp: 98.6 F (37 C) (09/24 2140) Temp Source: Oral (09/24 2140) BP: 113/78 (09/24 2140) Pulse Rate: 94 (09/24 2140)  Labs: Recent Labs    12/17/20 0847 12/17/20 0847 12/18/20 0133 12/19/20 0414 12/19/20 0604 12/19/20 0745 12/20/20 0447  HGB 7.5*  --  7.8* 7.9*  --   --  8.3*  HCT 23.1*  --  24.6* 25.0*  --   --  26.1*  PLT 226  --  232 262  --   --  292  LABPROT  --    < > 16.4* >90.0*  --  18.1* 19.5*  INR  --    < > 1.3* >10.0*  --  1.5* 1.7*  CREATININE 1.07*  --  1.00  --  0.98  --   --    < > = values in this interval not displayed.     Estimated Creatinine Clearance: 61.3 mL/min (by C-G formula based on SCr of 0.98 mg/dL).   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 INR 1.7 on admission Was on LMWH post-op until admission for bleeding 9/19.  Has heparin allergy but not HIT Home regimen: Coumadin 4.5mg  daily except 6mg  on Wednesdays. Resumed Coumadin 9/22. INR this morning is 1.7 trending up on PTA warfarin dosing, but still below goal. Hgb stable low and PLT wnl.   Goal of Therapy:  Anti-Xa level 0.6-1 units/ml 4hrs after LMWH dose  given Monitor platelets by anticoagulation protocol: Yes INR Goal: 2.5-3.5 per previous anticoagulation notes   Plan:  Continue Lovenox 110mg /12h hrs. Coumadin 5 mg po x 1 dose tonight Daily INR  Cephus Slater, PharmD, [place] Resident [phone] 12/20/2020 7:31 AM

## 2020-12-20 NOTE — Plan of Care (Signed)

## 2020-12-20 NOTE — Progress Notes (Signed)
PROGRESS NOTE    JERA HEADINGS  OQH:[id number] DOB: Sep 14, 1945 DOA: 12/14/2020 PCP: Josetta Huddle, MD   Chief Complaint  Patient presents with   Follow-up   Brief Narrative 75 yo with hx atrial fibrillation on warfarin, hx PT/DVT, hx lupus anticoagulant, with recent surgery with Dr. Sharol Given for the excision of Raechel Marcos left thigh mass represents with acute blood loss anemia and post operative bleeding.  She was on lovenox bridge with warfarin at the time of admission.  See below for additional details   Assessment & Plan:   Principal Problem:   Symptomatic anemia Active Problems:   History of pulmonary embolism   Permanent atrial fibrillation (HCC)   History of CVA (cerebrovascular accident)   Essential hypertension   CHF (congestive heart failure) (HCC)   Bleeding from wound   Acute blood loss anemia   Lupus anticoagulant syndrome (HCC)  Acute Blood Loss Anemia  Post Operative Blood Loss - Hb 9/7 12.3 - hb 9/19 7.8 at presentation - s/p 2 units pRBC with inappropriate response to ~8 - Hb 8.3 today, INR 1.7 - will continue to trend H/H for now - transfuse for symptomatic or <7 - doesn't appear symptomatic at this time -> repeat orthostatics - continue lovenox for now, start warfarin - given her complexity and need for lovenox bridge in setting of her risk for clotting - with hemodynamically significant bleed prior to admission, will maintain inpatient for now while awaiting therapeutic INR - some mild oozing from her L leg, very mild, will continue to monitor - appreciate orthopedics c/s - wrap thigh with ace wrap  - 9/14 path from surgery - hematoma involving adipose tissue, traumatized lipoma? 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   - continue lovenox for now - will resume warfarin - negative HIT ab 01/2017  Hx DVT  PE - continue lovenox - will resume warfarin  Hx CVA - noted, continue anticoagulation  Atrial fibrillation - continue warfarin/lovenox  Cough - CXR negative, follow  - tessalon  DVT prophylaxis: lovenox Code Statusfull  Family Communication: daughter at bedside 9/23 Disposition:   Status is: Inpatient  Remains inpatient appropriate because:Inpatient level of care appropriate due to severity of illness  Dispo: The patient is from: Home              Anticipated d/c is to: Home              Patient currently is not medically stable to d/c.   Difficult to place patient No       Consultants:  orthopedics  Procedures:  Excision soft tissue mass L thigh 9/14  Antimicrobials:  Anti-infectives (From admission, onward)    None          Subjective: No complaints today Continued soreness to L thigh Felt lightheaded last night when standing  Objective: Vitals:   12/19/20 0812 12/19/20 1413 12/19/20 2140 12/20/20 0827  BP: 107/73 113/73 113/78 138/60  Pulse: 79 73 94 86  Resp: 14 17 16 18   Temp: 97.8 F (36.6 C) 98.4 F (36.9 C) 98.6 F (37 C) 97.7 F (36.5 C)  TempSrc: Oral Oral Oral Oral  SpO2: 99%  98% 100%  Weight:      Height:        Intake/Output Summary (  Last 24 hours) at 12/20/2020 1119 Last data filed at 12/20/2020 0500 Gross per 24 hour  Intake 480 ml  Output 1300 ml  Net -820 ml   Filed Weights   12/14/20 1234  Weight: 110.2 kg    Examination:  General: No acute distress. Eating lunch Cardiovascular: RRR Lungs: unlabored Abdomen: Soft, nontender, nondistended Neurological: Alert and oriented 3. Moves all extremities 4 . Cranial nerves II through XII grossly intact. Extremities: swollen L thigh with soft compartments, ace in place   Data Reviewed: I have personally reviewed following labs and imaging studies  CBC: Recent Labs  Lab  12/14/20 1231 12/15/20 0324 12/16/20 0223 12/16/20 1250 12/16/20 2030 12/17/20 0847 12/18/20 0133 12/19/20 0414 12/20/20 0447  WBC 9.9   < > 9.2  --   --  10.3 6.9 7.1 8.7  NEUTROABS 7.4  --   --   --   --  8.0* 4.3  --   --   HGB 7.8*   < > 7.8*   < > 7.7* 7.5* 7.8* 7.9* 8.3*  HCT 25.3*   < > 24.2*   < > 24.5* 23.1* 24.6* 25.0* 26.1*  MCV 106.3*   < > 101.3*  --   --  102.2* 101.7* 102.0* 102.8*  PLT 184   < > 181  --   --  226 232 262 292   < > = values in this interval not displayed.    Basic Metabolic Panel: Recent Labs  Lab 12/14/20 2051 12/15/20 0324 12/17/20 0847 12/18/20 0133 12/19/20 0604  NA 134* 135 133* 136 138  K 4.1 4.5 4.1 3.7 3.7  CL 101 101 98 100 101  CO2 25 25 28 29 30   GLUCOSE 201* 132* 186* 98 102*  BUN 12 13 15 13 14   CREATININE 1.03* 0.86 1.07* 1.00 0.98  CALCIUM 8.1* 8.3* 8.3* 8.4* 8.6*  MG  --   --   --  1.9 1.9  PHOS  --   --   --  3.7 3.6    GFR: Estimated Creatinine Clearance: 61.3 mL/min (by C-G formula based on SCr of 0.98 mg/dL).  Liver Function Tests: Recent Labs  Lab 12/17/20 0847 12/18/20 0133 12/19/20 0604  AST 12* 14* 14*  ALT 11 10 9   ALKPHOS 58 58 61  BILITOT 1.2 1.5* 1.1  PROT 5.8* 5.9* 6.2*  ALBUMIN 2.4* 2.5* 2.5*    CBG: No results for input(s): GLUCAP in the last 168 hours.   Recent Results (from the past 240 hour(s))  Resp Panel by RT-PCR (Flu Hellena Pridgen&B, Covid) Nasopharyngeal Swab     Status: None   Collection Time: 12/14/20  4:38 PM   Specimen: Nasopharyngeal Swab; Nasopharyngeal(NP) swabs in vial transport medium  Result Value Ref Range Status   SARS Coronavirus 2 by RT PCR NEGATIVE NEGATIVE Final    Comment: (NOTE) SARS-CoV-2 target nucleic acids are NOT DETECTED.  The SARS-CoV-2 RNA is generally detectable in upper respiratory specimens during the acute phase of infection. The lowest concentration of SARS-CoV-2 viral copies this assay can detect is 138 copies/mL. Waylin Dorko negative result does not preclude  SARS-Cov-2 infection and should not be used as the sole basis for treatment or other patient management decisions. Malaika Arnall negative result may occur with  improper specimen collection/handling, submission of specimen other than nasopharyngeal swab, presence of viral mutation(s) within the areas targeted by this assay, and inadequate number of viral copies(<138 copies/mL). Valincia Touch negative result must be combined with clinical observations, patient history, and  epidemiological information. The expected result is Negative.  Fact Sheet for Patients:  EntrepreneurPulse.com.au  Fact Sheet for Healthcare Providers:  IncredibleEmployment.be  This test is no t yet approved or cleared by the [place] FDA and  has been authorized for detection and/or diagnosis of SARS-CoV-2 by FDA under an Emergency Use Authorization (EUA). This EUA will remain  in effect (meaning this test can be used) for the duration of the COVID-19 declaration under Section 564(b)(1) of the Act, 21 U.S.C.section 360bbb-3(b)(1), unless the authorization is terminated  or revoked sooner.       Influenza Marcine Gadway by PCR NEGATIVE NEGATIVE Final   Influenza B by PCR NEGATIVE NEGATIVE Final    Comment: (NOTE) The Xpert Xpress SARS-CoV-2/FLU/RSV plus assay is intended as an aid in the diagnosis of influenza from Nasopharyngeal swab specimens and should not be used as Ziyonna Christner sole basis for treatment. Nasal washings and aspirates are unacceptable for Xpert Xpress SARS-CoV-2/FLU/RSV testing.  Fact Sheet for Patients: EntrepreneurPulse.com.au  Fact Sheet for Healthcare Providers: IncredibleEmployment.be  This test is not yet approved or cleared by the [place] FDA and has been authorized for detection and/or diagnosis of SARS-CoV-2 by FDA under an Emergency Use Authorization (EUA). This EUA will remain in effect (meaning this test can be used) for the duration of  the COVID-19 declaration under Section 564(b)(1) of the Act, 21 U.S.C. section 360bbb-3(b)(1), unless the authorization is terminated or revoked.  Performed at [facility], [place] [address], [place], [place] [address]          Radiology Studies: DG CHEST PORT 1 VIEW  Result Date: 12/18/2020 CLINICAL DATA:  Cough EXAM: PORTABLE CHEST 1 VIEW COMPARISON:  09/03/2020 FINDINGS: Cardiomegaly. No focal opacity, pleural effusion, or pneumothorax. Aortic atherosclerosis. IMPRESSION: No active disease.  Cardiomegaly Electronically Signed   By: Donavan Foil M.D.   On: 12/18/2020 21:02        Scheduled Meds:  calcium-vitamin D   Oral BID   cholecalciferol  2,000 Units Oral Daily   enoxaparin (LOVENOX) injection  110 mg Subcutaneous Q12H   ferrous sulfate  325 mg Oral Q breakfast   FLUoxetine  20 mg Oral Daily   fluticasone  2 spray Each Nare Daily   furosemide  40 mg Oral Daily   metoprolol tartrate  25 mg Oral BID   potassium chloride  10 mEq Oral Daily   sodium chloride flush  3 mL Intravenous Q12H   warfarin  5 mg Oral ONCE-1600   Warfarin - Pharmacist Dosing Inpatient   Does not apply q1600   Continuous Infusions:  sodium chloride     sodium chloride       LOS: 5 days    Time spent: over 30 min    Fayrene Helper, MD [facility]   To contact the attending provider between 7A-7P or the covering provider during after hours 7P-7A, please log into the web site [email or web address] and access using universal [place] password for that web site. If you do not have the password, please call the hospital operator.  12/20/2020, 11:19 AM

## 2020-12-21 DIAGNOSIS — D649 Anemia, unspecified: Secondary | ICD-10-CM | POA: Diagnosis not present

## 2020-12-21 LAB — BASIC METABOLIC PANEL
Anion gap: 9 (ref 5–15)
BUN: 17 mg/dL (ref 8–23)
CO2: 30 mmol/L (ref 22–32)
Calcium: 8.7 mg/dL — ABNORMAL LOW (ref 8.9–10.3)
Chloride: 98 mmol/L (ref 98–111)
Creatinine, Ser: 1.07 mg/dL — ABNORMAL HIGH (ref 0.44–1.00)
GFR, Estimated: 54 mL/min — ABNORMAL LOW (ref >60)
Glucose, Bld: 98 mg/dL (ref 70–99)
Potassium: 3.5 mmol/L (ref 3.5–5.1)
Sodium: 137 mmol/L (ref 135–145)

## 2020-12-21 LAB — PHOSPHORUS: Phosphorus: 3.5 mg/dL (ref 2.5–4.6)

## 2020-12-21 LAB — CBC
HCT: 26.5 % — ABNORMAL LOW (ref 36.0–46.0)
Hemoglobin: 8.4 g/dL — ABNORMAL LOW (ref 12.0–15.0)
MCH: 32.6 pg (ref 26.0–34.0)
MCHC: 31.7 g/dL (ref 30.0–36.0)
MCV: 102.7 fL — ABNORMAL HIGH (ref 80.0–100.0)
Platelets: 304 10*3/uL (ref 150–400)
RBC: 2.58 MIL/uL — ABNORMAL LOW (ref 3.87–5.11)
RDW: 15.9 % — ABNORMAL HIGH (ref 11.5–15.5)
WBC: 9.4 10*3/uL (ref 4.0–10.5)
nRBC: 0 % (ref 0.0–0.2)

## 2020-12-21 LAB — TYPE AND SCREEN
ABO/RH(D): O POS
Antibody Screen: NEGATIVE

## 2020-12-21 LAB — PROTIME-INR
INR: 1.8 — ABNORMAL HIGH (ref 0.8–1.2)
Prothrombin Time: 20.6 seconds — ABNORMAL HIGH (ref 11.4–15.2)

## 2020-12-21 LAB — TROPONIN I (HIGH SENSITIVITY): Troponin I (High Sensitivity): 42 ng/L — ABNORMAL HIGH (ref <18)

## 2020-12-21 LAB — HEMOGLOBIN AND HEMATOCRIT, BLOOD
HCT: 28.7 % — ABNORMAL LOW (ref 36.0–46.0)
Hemoglobin: 9.2 g/dL — ABNORMAL LOW (ref 12.0–15.0)

## 2020-12-21 LAB — MAGNESIUM: Magnesium: 1.9 mg/dL (ref 1.7–2.4)

## 2020-12-21 MED ORDER — WARFARIN SODIUM 6 MG PO TABS
6.0000 mg | ORAL_TABLET | Freq: Once | ORAL | Status: AC
  Administered 2020-12-21: 6 mg via ORAL
  Filled 2020-12-21: qty 1

## 2020-12-21 NOTE — Progress Notes (Signed)
PROGRESS NOTE    [name]  ZOX:[id number] DOB: 21-Jun-1945 DOA: 12/14/2020 PCP: Josetta Huddle, MD   Chief Complaint  Patient presents with   Follow-up   Brief Narrative 75 yo with hx atrial fibrillation on warfarin, hx PT/DVT, hx lupus anticoagulant, with recent surgery with Dr. Sharol Given for the excision of Leiani Enright left thigh mass represents with acute blood loss anemia and post operative bleeding.  She was on lovenox bridge with warfarin at the time of admission.  See below for additional details   Assessment & Plan:   Principal Problem:   Symptomatic anemia Active Problems:   History of pulmonary embolism   Permanent atrial fibrillation (HCC)   History of CVA (cerebrovascular accident)   Essential hypertension   CHF (congestive heart failure) (HCC)   Bleeding from wound   Acute blood loss anemia   Lupus anticoagulant syndrome (HCC)  Acute Blood Loss Anemia  Post Operative Blood Loss - Hb 9/7 12.3 - hb 9/19 7.8 at presentation - s/p 2 units pRBC with inappropriate response to ~8 - Hb 8.4 today, INR 1.8 -- continued oozing today (more than previous days), though Hb is stable at this time -> discussed with Dr. Sharol Given who will evaluate wound tomorrow - will continue to trend H/H for now - transfuse for symptomatic or <7 - doesn't appear symptomatic at this time  - continue lovenox for now, start warfarin - given her complexity and need for lovenox bridge in setting of her risk for clotting - with hemodynamically significant bleed prior to admission, will maintain inpatient for now while awaiting therapeutic INR >2 - some mild oozing from her L leg, very mild, will continue to monitor - appreciate orthopedics c/s - wrap thigh with ace wrap  - 9/14 path from surgery - hematoma involving adipose tissue, traumatized lipoma? 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  - continue lovenox for now - will resume warfarin - negative HIT ab 01/2017  Hx DVT  PE - continue lovenox - will resume warfarin  Hx CVA - noted, continue anticoagulation  Atrial fibrillation - continue warfarin/lovenox  Cough - CXR negative, follow  - tessalon  DVT prophylaxis: lovenox Code Statusfull  Family Communication: none  Disposition:   Status is: Inpatient  Remains inpatient appropriate because:Inpatient level of care appropriate due to severity of illness  Dispo: The patient is from: Home              Anticipated d/c is to: Home              Patient currently is not medically stable to d/c.   Difficult to place patient No       Consultants:  orthopedics  Procedures:  Excision soft tissue mass L thigh 9/14  Antimicrobials:  Anti-infectives (From admission, onward)    None          Subjective: No new complaints today  Objective: Vitals:   12/20/20 1531 12/20/20 1533 12/20/20 2306 12/21/20 0738  BP: (!) 98/51 122/68 (!) 150/77 115/74  Pulse: 91 98 (!) 105 69  Resp: 18  18 16   Temp: 98 F (36.7 C)  98.5 F (36.9 C) 98.1 F (36.7 C)  TempSrc:   Oral Oral  SpO2: 98% 99% 98% (!) 87%  Weight:      Height:  Intake/Output Summary (Last 24 hours) at 12/21/2020 1038 Last data filed at 12/21/2020 0900 Gross per 24 hour  Intake 720 ml  Output 900 ml  Net -180 ml   Filed Weights   12/14/20 1234  Weight: 110.2 kg    Examination:  General: No acute distress. Cardiovascular: RRR Lungs: unlabored Abdomen: Soft, nontender, nondistended Neurological: Alert and oriented 3. Moves all extremities 4. Cranial nerves II through XII grossly intact. Skin: Warm and dry. No rashes or lesions. Extremities: L thigh edema, seems relatively stable - more bleed through of dressing - wound oozing from proximal and distal incision sites    Data Reviewed: I have personally  reviewed following labs and imaging studies  CBC: Recent Labs  Lab 12/14/20 1231 12/15/20 0324 12/17/20 0847 12/18/20 0133 12/19/20 0414 12/20/20 0447 12/21/20 0218  WBC 9.9   < > 10.3 6.9 7.1 8.7 9.4  NEUTROABS 7.4  --  8.0* 4.3  --   --   --   HGB 7.8*   < > 7.5* 7.8* 7.9* 8.3* 8.4*  HCT 25.3*   < > 23.1* 24.6* 25.0* 26.1* 26.5*  MCV 106.3*   < > 102.2* 101.7* 102.0* 102.8* 102.7*  PLT 184   < > 226 232 262 292 304   < > = values in this interval not displayed.    Basic Metabolic Panel: Recent Labs  Lab 12/15/20 0324 12/17/20 0847 12/18/20 0133 12/19/20 0604 12/21/20 0218  NA 135 133* 136 138 137  K 4.5 4.1 3.7 3.7 3.5  CL 101 98 100 101 98  CO2 25 28 29 30 30   GLUCOSE 132* 186* 98 102* 98  BUN 13 15 13 14 17   CREATININE 0.86 1.07* 1.00 0.98 1.07*  CALCIUM 8.3* 8.3* 8.4* 8.6* 8.7*  MG  --   --  1.9 1.9 1.9  PHOS  --   --  3.7 3.6 3.5    GFR: Estimated Creatinine Clearance: 56.2 mL/min (Lauriann Milillo) (by C-G formula based on SCr of 1.07 mg/dL (H)).  Liver Function Tests: Recent Labs  Lab 12/17/20 0847 12/18/20 0133 12/19/20 0604  AST 12* 14* 14*  ALT 11 10 9   ALKPHOS 58 58 61  BILITOT 1.2 1.5* 1.1  PROT 5.8* 5.9* 6.2*  ALBUMIN 2.4* 2.5* 2.5*    CBG: No results for input(s): GLUCAP in the last 168 hours.   Recent Results (from the past 240 hour(s))  Resp Panel by RT-PCR (Flu Ladora Osterberg&B, Covid) Nasopharyngeal Swab     Status: None   Collection Time: 12/14/20  4:38 PM   Specimen: Nasopharyngeal Swab; Nasopharyngeal(NP) swabs in vial transport medium  Result Value Ref Range Status   SARS Coronavirus 2 by RT PCR NEGATIVE NEGATIVE Final    Comment: (NOTE) SARS-CoV-2 target nucleic acids are NOT DETECTED.  The SARS-CoV-2 RNA is generally detectable in upper respiratory specimens during the acute phase of infection. The lowest concentration of SARS-CoV-2 viral copies this assay can detect is 138 copies/mL. Madison Albea negative result does not preclude SARS-Cov-2 infection and  should not be used as the sole basis for treatment or other patient management decisions. Logen Heintzelman negative result may occur with  improper specimen collection/handling, submission of specimen other than nasopharyngeal swab, presence of viral mutation(s) within the areas targeted by this assay, and inadequate number of viral copies(<138 copies/mL). Camren Lipsett negative result must be combined with clinical observations, patient history, and epidemiological information. The expected result is Negative.  Fact Sheet for Patients:  EntrepreneurPulse.com.au  Fact Sheet for Healthcare Providers:  IncredibleEmployment.be  This test is no t yet approved or cleared by the [place] and  has been authorized for detection and/or diagnosis of SARS-CoV-2 by FDA under an Emergency Use Authorization (EUA). This EUA will remain  in effect (meaning this test can be used) for the duration of the COVID-19 declaration under Section 564(b)(1) of the Act, 21 U.S.C.section 360bbb-3(b)(1), unless the authorization is terminated  or revoked sooner.       Influenza Shaneeka Scarboro by PCR NEGATIVE NEGATIVE Final   Influenza B by PCR NEGATIVE NEGATIVE Final    Comment: (NOTE) The Xpert Xpress SARS-CoV-2/FLU/RSV plus assay is intended as an aid in the diagnosis of influenza from Nasopharyngeal swab specimens and should not be used as Anntoinette Haefele sole basis for treatment. Nasal washings and aspirates are unacceptable for Xpert Xpress SARS-CoV-2/FLU/RSV testing.  Fact Sheet for Patients: EntrepreneurPulse.com.au  Fact Sheet for Healthcare Providers: IncredibleEmployment.be  This test is not yet approved or cleared by the [place] FDA and has been authorized for detection and/or diagnosis of SARS-CoV-2 by FDA under an Emergency Use Authorization (EUA). This EUA will remain in effect (meaning this test can be used) for the duration of the COVID-19 declaration  under Section 564(b)(1) of the Act, 21 U.S.C. section 360bbb-3(b)(1), unless the authorization is terminated or revoked.  Performed at [facility], [place] [address] [place], [place] [address]          Radiology Studies: No results found.      Scheduled Meds:  calcium-vitamin D   Oral BID   cholecalciferol  2,000 Units Oral Daily   enoxaparin (LOVENOX) injection  110 mg Subcutaneous Q12H   ferrous sulfate  325 mg Oral Q breakfast   FLUoxetine  20 mg Oral Daily   fluticasone  2 spray Each Nare Daily   furosemide  40 mg Oral Daily   metoprolol tartrate  25 mg Oral BID   potassium chloride  10 mEq Oral Daily   sodium chloride flush  3 mL Intravenous Q12H   warfarin  6 mg Oral ONCE-1600   Warfarin - Pharmacist Dosing Inpatient   Does not apply q1600   Continuous Infusions:  sodium chloride     sodium chloride       LOS: 6 days    Time spent: over 30 min    Fayrene Helper, MD [facility]   To contact the attending provider between 7A-7P or the covering provider during after hours 7P-7A, please log into the web site [email or web address] and access using universal [place] password for that web site. If you do not have the password, please call the hospital operator.  12/21/2020, 10:38 AM

## 2020-12-21 NOTE — Progress Notes (Signed)
Received an order for EKG, and night shift RN made aware.

## 2020-12-21 NOTE — Plan of Care (Signed)

## 2020-12-21 NOTE — Progress Notes (Signed)
[place] for Lovenox / Coumadin Indication: history of PE/DVT, history of CVA/Afib, Lupus anticoagulant   Allergies  Allergen Reactions   Heparin Hives    Patient attests severe allergy- rash all over, severe itching, unable to take heparin   Other Itching and Rash    "Sheets and Linens used by Zacarias Pontes"    Patient Measurements: Height: 5\' 5"  (165.1 cm) Weight: 110.2 kg (243 lb) IBW/kg (Calculated) : 57  Vital Signs: Temp: 98.1 F (36.7 C) (09/26 0738) Temp Source: Oral (09/26 0738) BP: 115/74 (09/26 0738) Pulse Rate: 69 (09/26 0738)  Labs: Recent Labs    12/19/20 0414 12/19/20 0604 12/19/20 0745 12/20/20 0447 12/21/20 0218  HGB 7.9*  --   --  8.3* 8.4*  HCT 25.0*  --   --  26.1* 26.5*  PLT 262  --   --  292 304  LABPROT >90.0*  --  18.1* 19.5* 20.6*  INR >10.0*  --  1.5* 1.7* 1.8*  CREATININE  --  0.98  --   --  1.07*     Estimated Creatinine Clearance: 56.2 mL/min (A) (by C-G formula based on SCr of 1.07 mg/dL (H)).   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 INR 1.7 on admission Was on LMWH post-op until admission for bleeding 9/19.  Has heparin allergy but not HIT Home regimen: Coumadin 4.5mg  daily except 6mg  on Wednesdays. Resumed Coumadin 9/22. INR this morning is 1.8 trending up on PTA warfarin dosing, but still below goal. Hgb stable low and PLT wnl.   Goal of Therapy:  Anti-Xa level 0.6-1 units/ml 4hrs after LMWH dose given Monitor platelets by anticoagulation protocol: Yes INR Goal: 2.5-3.5 per previous anticoagulation notes   Plan:  Continue  Lovenox 110mg /12h hrs. Coumadin 6 mg po x 1 dose tonight Daily INR  Alanda Slim, PharmD, [facility] Clinical Pharmacist Please see AMION for all Pharmacists' Contact Phone Numbers 12/21/2020, 8:38 AM

## 2020-12-21 NOTE — Progress Notes (Addendum)
PROGRESS NOTE    [name]  MVH:[id number] DOB: 1945-10-12 DOA: 12/14/2020 PCP: Josetta Huddle, MD   Chief Complaint  Patient presents with   Follow-up   Brief Narrative 75 yo with hx atrial fibrillation on warfarin, hx PT/DVT, hx lupus anticoagulant, with recent surgery with Dr. Sharol Given for the excision of Jhonathan Desroches left thigh mass represents with acute blood loss anemia and post operative bleeding.  She was on lovenox bridge with warfarin at the time of admission.  See below for additional details   Assessment & Plan:   Principal Problem:   Symptomatic anemia Active Problems:   History of pulmonary embolism   Permanent atrial fibrillation (HCC)   History of CVA (cerebrovascular accident)   Essential hypertension   CHF (congestive heart failure) (HCC)   Bleeding from wound   Acute blood loss anemia   Lupus anticoagulant syndrome (HCC)  Addendum On review of therapy note, patient noted to have SBP of 54 systolic and nausea/vomiting while working with therapy.  Concerning for continued symptomatic anemia.  Hb this AM was stable at 8.4.   Stat EKG, repeat vitals Repeat orthostatics Repeat Hb, troponin  Will plan to transfuse 1 unit prbc - pending her labs and vitals  Acute Blood Loss Anemia  Post Operative Blood Loss - Hb 9/7 12.3 - hb 9/19 7.8 at presentation - s/p 2 units pRBC with inappropriate response to ~8 - Hb 8.4 today, INR 1.8 -- continued oozing today (more than previous days), though Hb is stable at this time -> discussed with Dr. Sharol Given who will evaluate wound tomorrow - will continue to trend H/H for now - transfuse for symptomatic or <7 - doesn't appear symptomatic at this time  - continue lovenox for now, start warfarin - given her complexity and need for lovenox bridge in setting of her risk for clotting - with hemodynamically significant bleed prior to admission, will maintain inpatient for now while awaiting therapeutic INR >2 - some mild oozing from her L leg,  very mild, will continue to monitor - appreciate orthopedics c/s - wrap thigh with ace wrap  - 9/14 path from surgery - hematoma involving adipose tissue, traumatized lipoma? 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  - continue lovenox for now - will resume warfarin - negative HIT ab 01/2017  Hx DVT  PE - continue lovenox - will resume warfarin  Hx CVA - noted, continue anticoagulation  Atrial fibrillation - continue warfarin/lovenox  Cough - CXR negative, follow  - tessalon  DVT prophylaxis: lovenox Code Statusfull  Family Communication: none  Disposition:   Status is: Inpatient  Remains inpatient appropriate because:Inpatient level of care appropriate due to severity of illness  Dispo: The patient is from: Home              Anticipated d/c is to: Home              Patient currently is not medically stable to d/c.   Difficult to place patient No       Consultants:  orthopedics  Procedures:  Excision soft tissue mass L thigh 9/14  Antimicrobials:  Anti-infectives (From admission, onward)    None          Subjective: No new complaints today  Objective: Vitals:   12/20/20 1533 12/20/20 2306 12/21/20 4401 12/21/20 1549  BP: 122/68 (!) 150/77 115/74 110/87  Pulse: 98 (!) 105 69 87  Resp:  18 16 15   Temp:  98.5 F (36.9 C) 98.1 F (36.7 C) 98.1 F (36.7 C)  TempSrc:  Oral Oral Oral  SpO2: 99% 98% (!) 87% 95%  Weight:      Height:        Intake/Output Summary (Last 24 hours) at 12/21/2020 1859 Last data filed at 12/21/2020 1500 Gross per 24 hour  Intake 480 ml  Output --  Net 480 ml   Filed Weights   12/14/20 1234  Weight: 110.2 kg    Examination:  General: No acute distress. Cardiovascular: RRR Lungs: unlabored Abdomen: Soft, nontender,  nondistended Neurological: Alert and oriented 3. Moves all extremities 4. Cranial nerves II through XII grossly intact. Skin: Warm and dry. No rashes or lesions. Extremities: L thigh edema, seems relatively stable - more bleed through of dressing - wound oozing from proximal and distal incision sites    Data Reviewed: I have personally reviewed following labs and imaging studies  CBC: Recent Labs  Lab 12/17/20 0847 12/18/20 0133 12/19/20 0414 12/20/20 0447 12/21/20 0218  WBC 10.3 6.9 7.1 8.7 9.4  NEUTROABS 8.0* 4.3  --   --   --   HGB 7.5* 7.8* 7.9* 8.3* 8.4*  HCT 23.1* 24.6* 25.0* 26.1* 26.5*  MCV 102.2* 101.7* 102.0* 102.8* 102.7*  PLT 226 232 262 292 030    Basic Metabolic Panel: Recent Labs  Lab 12/15/20 0324 12/17/20 0847 12/18/20 0133 12/19/20 0604 12/21/20 0218  NA 135 133* 136 138 137  K 4.5 4.1 3.7 3.7 3.5  CL 101 98 100 101 98  CO2 25 28 29 30 30   GLUCOSE 132* 186* 98 102* 98  BUN 13 15 13 14 17   CREATININE 0.86 1.07* 1.00 0.98 1.07*  CALCIUM 8.3* 8.3* 8.4* 8.6* 8.7*  MG  --   --  1.9 1.9 1.9  PHOS  --   --  3.7 3.6 3.5    GFR: Estimated Creatinine Clearance: 56.2 mL/min (Balraj Brayfield) (by C-G formula based on SCr of 1.07 mg/dL (H)).  Liver Function Tests: Recent Labs  Lab 12/17/20 0847 12/18/20 0133 12/19/20 0604  AST 12* 14* 14*  ALT 11 10 9   ALKPHOS 58 58 61  BILITOT 1.2 1.5* 1.1  PROT 5.8* 5.9* 6.2*  ALBUMIN 2.4* 2.5* 2.5*    CBG: No results for input(s): GLUCAP in the last 168 hours.   Recent Results (from the past 240 hour(s))  Resp Panel by RT-PCR (Flu Jeremiah Tarpley&B, Covid) Nasopharyngeal Swab     Status: None   Collection Time: 12/14/20  4:38 PM   Specimen: Nasopharyngeal Swab; Nasopharyngeal(NP) swabs in vial transport medium  Result Value Ref Range Status   SARS Coronavirus 2 by RT PCR NEGATIVE NEGATIVE Final    Comment: (NOTE) SARS-CoV-2 target nucleic acids are NOT DETECTED.  The SARS-CoV-2 RNA is generally detectable in upper  respiratory specimens during the acute phase of infection. The lowest concentration of SARS-CoV-2 viral copies this assay can detect is 138 copies/mL. Palmer Fahrner negative result does not preclude SARS-Cov-2 infection and should not be used as the sole basis for treatment or other patient management decisions. Jamez Ambrocio negative result may occur with  improper specimen collection/handling, submission of specimen other than nasopharyngeal swab, presence of viral mutation(s) within the areas targeted by this assay, and inadequate number of viral copies(<138 copies/mL). Kierre Deines negative result must be combined with clinical observations, patient history, and epidemiological information. The  expected result is Negative.  Fact Sheet for Patients:  EntrepreneurPulse.com.au  Fact Sheet for Healthcare Providers:  IncredibleEmployment.be  This test is no t yet approved or cleared by the [place] FDA and  has been authorized for detection and/or diagnosis of SARS-CoV-2 by FDA under an Emergency Use Authorization (EUA). This EUA will remain  in effect (meaning this test can be used) for the duration of the COVID-19 declaration under Section 564(b)(1) of the Act, 21 U.S.C.section 360bbb-3(b)(1), unless the authorization is terminated  or revoked sooner.       Influenza Brindley Madarang by PCR NEGATIVE NEGATIVE Final   Influenza B by PCR NEGATIVE NEGATIVE Final    Comment: (NOTE) The Xpert Xpress SARS-CoV-2/FLU/RSV plus assay is intended as an aid in the diagnosis of influenza from Nasopharyngeal swab specimens and should not be used as Dreydon Cardenas sole basis for treatment. Nasal washings and aspirates are unacceptable for Xpert Xpress SARS-CoV-2/FLU/RSV testing.  Fact Sheet for Patients: EntrepreneurPulse.com.au  Fact Sheet for Healthcare Providers: IncredibleEmployment.be  This test is not yet approved or cleared by the [place] FDA and has been  authorized for detection and/or diagnosis of SARS-CoV-2 by FDA under an Emergency Use Authorization (EUA). This EUA will remain in effect (meaning this test can be used) for the duration of the COVID-19 declaration under Section 564(b)(1) of the Act, 21 U.S.C. section 360bbb-3(b)(1), unless the authorization is terminated or revoked.  Performed at [facility], [place] [address] [place], [place] [address]          Radiology Studies: No results found.      Scheduled Meds:  calcium-vitamin D   Oral BID   cholecalciferol  2,000 Units Oral Daily   enoxaparin (LOVENOX) injection  110 mg Subcutaneous Q12H   ferrous sulfate  325 mg Oral Q breakfast   FLUoxetine  20 mg Oral Daily   fluticasone  2 spray Each Nare Daily   furosemide  40 mg Oral Daily   metoprolol tartrate  25 mg Oral BID   potassium chloride  10 mEq Oral Daily   sodium chloride flush  3 mL Intravenous Q12H   Warfarin - Pharmacist Dosing Inpatient   Does not apply q1600   Continuous Infusions:  sodium chloride     sodium chloride       LOS: 6 days    Time spent: over 30 min    Fayrene Helper, MD [facility]   To contact the attending provider between 7A-7P or the covering provider during after hours 7P-7A, please log into the web site [email or web address] and access using universal [place] password for that web site. If you do not have the password, please call the hospital operator.  12/21/2020, 6:59 PM

## 2020-12-21 NOTE — Progress Notes (Signed)
Physical Therapy Treatment Patient Details Name: [name] MRN: [id number] DOB: [birth year] Today's Date: 12/21/2020   History of Present Illness 75 y.o. female presents to [facility] ED on 12/14/2020 with bleeding from wound, recently undergoing L thigh tumor excision on 9/14. Pt admitted for management of postoperative complication symptomatic acute blood loss anemia. PMH includes atrial fibrillation on warfarin, history of PE and DVT.    PT Comments    Pt admitted with above diagnosis. Pt reported feeling weak upon standing.  Some BP fluctuations (see below) during treatment as well as incr HR and pt vomited as well.  Pt was able to ambulate incr distance today and does not need a lot of physical assist.  Pts daughter was present and feels that she can care of pt when she is medically stable. Pt currently with functional limitations due to the deficits listed below (see PT Problem List). Pt will benefit from skilled PT to increase their independence and safety with mobility to allow discharge to the venue listed below.     BP sitting after first short walk was 129/84 with HR 79 bpm.  After longer walk BP 54/37 with HR 124 bpm and pt vomited. Called and notified nursing. BP at end of treatment 117/88 and HR 91 bpm.  Recommendations for follow up therapy are one component of a multi-disciplinary discharge planning process, led by the attending physician.  Recommendations may be updated based on patient status, additional functional criteria and insurance authorization.  Follow Up Recommendations  Home health PT;Supervision for mobility/OOB     Equipment Recommendations  None recommended by PT    Recommendations for Other Services       Precautions / Restrictions Precautions Precautions: Fall Precaution Comments: L thigh wound Restrictions Weight Bearing Restrictions: No     Mobility  Bed Mobility Overal bed mobility: Needs Assistance Bed Mobility: Supine to Sit     Supine to sit:  HOB elevated;Min assist     General bed mobility comments: Pt navigated LEs off EOB well and raised trunk on her own. Performed with increased time. Able to scoot hips forward without assist    Transfers Overall transfer level: Needs assistance Equipment used: Rolling walker (2 wheeled) Transfers: Sit to/from Stand Sit to Stand: Min guard         General transfer comment: No assist to boost to stand.Pt stated feeling weak when standing  Ambulation/Gait Ambulation/Gait assistance: Min guard Gait Distance (Feet): 45 Feet Assistive device: Rolling walker (2 wheeled) Gait Pattern/deviations: Trunk flexed;Decreased stance time - right;Step-through pattern;Decreased stride length;Wide base of support Gait velocity: Decreased Gait velocity interpretation: <1.8 ft/sec, indicate of risk for recurrent falls General Gait Details: Pt demonstrated step through pattern with wide base of support and increased trunk flexion. Pt stated feeling very weak. Verbal cues to maintain upright posture. Also reported dizziness.  BP sitting after first short walk was 129/84 with HR 79 bpm.  After longer walk BP 54/37 with HR 124 bpm and pt vomited. Called and notified nursing. Pt then stated she needed to have BM.  Assisted her to 3N1 and she used bathroom.  Stool dark therefore notified nurse again. Pt was cleaned by PT and left in reclinr. BP at end of treatment 117/88 and HR 91 bpm.   Stairs             Wheelchair Mobility    Modified Rankin (Stroke Patients Only)       Balance Overall balance assessment: Needs assistance Sitting-balance support: Feet unsupported;Single  extremity supported Sitting balance-Leahy Scale: Good     Standing balance support: Bilateral upper extremity supported;During functional activity Standing balance-Leahy Scale: Poor Standing balance comment: Require BUE on RW                            Cognition Arousal/Alertness: Awake/alert Behavior During  Therapy: WFL for tasks assessed/performed Overall Cognitive Status: Within Functional Limits for tasks assessed                                        Exercises      General Comments        Pertinent Vitals/Pain Pain Assessment: Faces Faces Pain Scale: Hurts a little bit Pain Location: L thigh with movement Pain Descriptors / Indicators: Grimacing Pain Intervention(s): Limited activity within patient's tolerance;Monitored during session;Repositioned    Home Living                      Prior Function            PT Goals (current goals can now be found in the care plan section) Acute Rehab PT Goals Patient Stated Goal: to go home and improve activity tolerance Progress towards PT goals: Progressing toward goals    Frequency    Min 3X/week      PT Plan Current plan remains appropriate    Co-evaluation              AM-PAC PT "6 Clicks" Mobility   Outcome Measure  Help needed turning from your back to your side while in a flat bed without using bedrails?: A Little Help needed moving from lying on your back to sitting on the side of a flat bed without using bedrails?: A Little Help needed moving to and from a bed to a chair (including a wheelchair)?: A Little Help needed standing up from a chair using your arms (e.g., wheelchair or bedside chair)?: A Little Help needed to walk in hospital room?: A Little Help needed climbing 3-5 steps with a railing? : A Lot 6 Click Score: 17    End of Session Equipment Utilized During Treatment: Gait belt Activity Tolerance: Patient limited by fatigue Patient left: in chair;with call bell/phone within reach;with chair alarm set;with family/visitor present Nurse Communication: Mobility status PT Visit Diagnosis: Other abnormalities of gait and mobility (R26.89);Muscle weakness (generalized) (M62.81) Pain - Right/Left: Left Pain - part of body:  (thigh)     Time: 9628-3662 PT Time Calculation  (min) (ACUTE ONLY): 41 min  Charges:  $Gait Training: 8-22 mins $Therapeutic Activity: 8-22 mins $Self Care/Home Management: 8-22                     [facility] M,PT Acute Rehab Services [phone] (pager)    Alvira Philips 12/21/2020, 1:17 PM

## 2020-12-21 NOTE — Progress Notes (Signed)
This Probation officer has been informed by PT that she had an episodex1 of hyptensive/tachycardic while walking but at the end of tx bp was 117/88 and HR 91. Pt seen in room,alert/oriented in no apparent distress,c/o nauseated, but refused meds. No emesis noted at this time. Denies any discomfort/pain at this time.Pt monitored and resting in recliner chair comfortably

## 2020-12-22 LAB — MAGNESIUM: Magnesium: 2 mg/dL (ref 1.7–2.4)

## 2020-12-22 LAB — COMPREHENSIVE METABOLIC PANEL
ALT: 10 U/L (ref 0–44)
AST: 18 U/L (ref 15–41)
Albumin: 2.7 g/dL — ABNORMAL LOW (ref 3.5–5.0)
Alkaline Phosphatase: 69 U/L (ref 38–126)
Anion gap: 7 (ref 5–15)
BUN: 18 mg/dL (ref 8–23)
CO2: 30 mmol/L (ref 22–32)
Calcium: 8.9 mg/dL (ref 8.9–10.3)
Chloride: 99 mmol/L (ref 98–111)
Creatinine, Ser: 1.13 mg/dL — ABNORMAL HIGH (ref 0.44–1.00)
GFR, Estimated: 51 mL/min — ABNORMAL LOW (ref >60)
Glucose, Bld: 107 mg/dL — ABNORMAL HIGH (ref 70–99)
Potassium: 3.5 mmol/L (ref 3.5–5.1)
Sodium: 136 mmol/L (ref 135–145)
Total Bilirubin: 1.3 mg/dL — ABNORMAL HIGH (ref 0.3–1.2)
Total Protein: 6.5 g/dL (ref 6.5–8.1)

## 2020-12-22 LAB — CBC WITH DIFFERENTIAL/PLATELET
Abs Immature Granulocytes: 0.05 10*3/uL (ref 0.00–0.07)
Basophils Absolute: 0 10*3/uL (ref 0.0–0.1)
Basophils Relative: 0 %
Eosinophils Absolute: 0.3 10*3/uL (ref 0.0–0.5)
Eosinophils Relative: 3 %
HCT: 28.5 % — ABNORMAL LOW (ref 36.0–46.0)
Hemoglobin: 9 g/dL — ABNORMAL LOW (ref 12.0–15.0)
Immature Granulocytes: 1 %
Lymphocytes Relative: 21 %
Lymphs Abs: 2.3 10*3/uL (ref 0.7–4.0)
MCH: 32.7 pg (ref 26.0–34.0)
MCHC: 31.6 g/dL (ref 30.0–36.0)
MCV: 103.6 fL — ABNORMAL HIGH (ref 80.0–100.0)
Monocytes Absolute: 0.9 10*3/uL (ref 0.1–1.0)
Monocytes Relative: 9 %
Neutro Abs: 7.1 10*3/uL (ref 1.7–7.7)
Neutrophils Relative %: 66 %
Platelets: 321 10*3/uL (ref 150–400)
RBC: 2.75 MIL/uL — ABNORMAL LOW (ref 3.87–5.11)
RDW: 16.3 % — ABNORMAL HIGH (ref 11.5–15.5)
WBC: 10.6 10*3/uL — ABNORMAL HIGH (ref 4.0–10.5)
nRBC: 0.2 % (ref 0.0–0.2)

## 2020-12-22 LAB — TROPONIN I (HIGH SENSITIVITY): Troponin I (High Sensitivity): 41 ng/L — ABNORMAL HIGH (ref <18)

## 2020-12-22 LAB — PHOSPHORUS: Phosphorus: 3.5 mg/dL (ref 2.5–4.6)

## 2020-12-22 LAB — PROTIME-INR
INR: 1.8 — ABNORMAL HIGH (ref 0.8–1.2)
Prothrombin Time: 21.2 seconds — ABNORMAL HIGH (ref 11.4–15.2)

## 2020-12-22 MED ORDER — WARFARIN SODIUM 7.5 MG PO TABS
7.5000 mg | ORAL_TABLET | Freq: Once | ORAL | Status: AC
  Administered 2020-12-22: 7.5 mg via ORAL
  Filled 2020-12-22: qty 1

## 2020-12-22 MED ORDER — LACTATED RINGERS IV BOLUS
500.0000 mL | Freq: Once | INTRAVENOUS | Status: AC
  Administered 2020-12-22: 500 mL via INTRAVENOUS

## 2020-12-22 NOTE — Progress Notes (Signed)
[facility] for Lovenox / Coumadin Indication: history of PE/DVT, history of CVA/Afib, Lupus anticoagulant   Allergies  Allergen Reactions   Heparin Hives    Patient attests severe allergy- rash all over, severe itching, unable to take heparin   Other Itching and Rash    "Sheets and Linens used by Zacarias Pontes"    Patient Measurements: Height: 5\' 5"  (165.1 cm) Weight: 110.2 kg (243 lb) IBW/kg (Calculated) : 57  Vital Signs: Temp: 97.8 F (36.6 C) (09/27 0758) Temp Source: Oral (09/27 0758) BP: 114/68 (09/27 0758) Pulse Rate: 86 (09/27 0758)  Labs: Recent Labs    12/20/20 0447 12/21/20 0218 12/21/20 2000 12/22/20 0147  HGB 8.3* 8.4* 9.2* 9.0*  HCT 26.1* 26.5* 28.7* 28.5*  PLT 292 304  --  321  LABPROT 19.5* 20.6*  --  21.2*  INR 1.7* 1.8*  --  1.8*  CREATININE  --  1.07*  --  1.13*  TROPONINIHS  --   --  42*  --      Estimated Creatinine Clearance: 53.2 mL/min (A) (by C-G formula based on SCr of 1.13 mg/dL (H)).   Assessment: 17 YOF s/p removal of large tumor (lipoma) 9/14 left thigh with worsening bleeding of her excision site. On Coumadin PTA for h/o PE/DVT/CVA/Afib and lupus anticoagulant. Has an allergy of hives listed to heparin but tolerating Lovenox without allergic issues.   Home regimen: Coumadin 4.5mg  daily except 6mg  on Wednesdays.  Resumed Coumadin 9/22. INR this morning is unchanged and subtherapeutic at 1.8. Hgb slowly improving and PLT wnl. Per RN, ortho inspected wound and stated that wound and oozing has improved although slight oozing persists.   Goal of Therapy:  Anti-Xa level 0.6-1 units/ml 4hrs after LMWH dose given Monitor platelets by anticoagulation protocol: Yes INR Goal: 2.5-3.5 per previous anticoagulation notes   Plan:  Continue Lovenox 110 mg q12 hours  Coumadin 7.5 mg po x 1 dose tonight Daily INR  Albertina Parr, PharmD., BCPS, BCCCP Clinical Pharmacist Please refer to [facility] for  unit-specific pharmacist

## 2020-12-22 NOTE — Plan of Care (Signed)

## 2020-12-22 NOTE — Progress Notes (Addendum)
PROGRESS NOTE    [name]  MWU:[id number] DOB: February 22, 1946 DOA: 12/14/2020 PCP: Josetta Huddle, MD   Chief Complaint  Patient presents with   Follow-up   Brief Narrative 75 yo with hx atrial fibrillation on warfarin, hx PT/DVT, hx lupus anticoagulant, with recent surgery with Dr. Sharol Given for the excision of Shamaria Kavan left thigh mass represents with acute blood loss anemia and post operative bleeding.  She was on lovenox bridge with warfarin at the time of admission.  She presented with post operative bleeding.  She was transfused 2 units of pRBC with inappropriate response.  She's remained for lovenox/warfarin bridge in house due to need for bridging anticoagulation with subtherapeutic INR and risk of bleeding.    INR approaching 2, when >2 can d/c home on home warfarin.    Suspect in next 24 -48 hrs.    Assessment & Plan:   Principal Problem:   Symptomatic anemia Active Problems:   History of pulmonary embolism   Permanent atrial fibrillation (HCC)   History of CVA (cerebrovascular accident)   Essential hypertension   CHF (congestive heart failure) (HCC)   Bleeding from wound   Acute blood loss anemia   Lupus anticoagulant syndrome (HCC)  Orthostatic Hypotension  Nausea and Vomiting On review of therapy note from 9/26, patient noted to have SBP of 54 systolic and nausea/vomiting while working with therapy.   Concerning for continued symptomatic anemia, but Hb 9 on repeat Orthostatics 9/27 AM + (dropped from ~129 lying to 99 systolic when standing at 3 min) Bolused 500 cc, with improved orthostatics EKG 9/26 with atrial fibrillation -> troponin mildly elevated, but stable - suspect demand Repeat orthostatics 9/28 AM, follow how she does walking around room to see if she develops n/v again  Acute Blood Loss Anemia  Post Operative Blood Loss - Hb on 9/7 was 12.3 - hb 9/19 7.8 on the day of presentation - s/p 2 units pRBC with inappropriate response to ~8 - Hb 9 today, INR 1.8  -- Dr. Sharol Given evaluated the wound today, recommended dry dressing changes daily, washing leg with soap and water and continue ace circumferential compression  - transfuse for symptomatic or <7 - orthostasis today improved with IVF bolus, follow in Am - continue lovenox for now, start warfarin - given her complexity and need for lovenox bridge in setting of her risk for clotting - with hemodynamically significant bleed prior to admission, will maintain inpatient for now while awaiting therapeutic INR >2 - some mild oozing from her L leg, very mild, will continue to monitor - 9/14 path from surgery - hematoma involving adipose tissue, traumatized lipoma? 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  - continue lovenox for now - will resume warfarin - negative HIT ab 01/2017 - of note, outpatient INR goal 2.5-3.5, planning to bridge with lovenox to INR of 2, then will stop bridge (discussed this with her primary cardiologist)  Hx DVT  PE - continue lovenox - continue warfarin  Hx CVA - noted, continue anticoagulation  Atrial fibrillation - continue warfarin/lovenox  Cough - CXR negative, follow  - tessalon  DVT prophylaxis: lovenox Code Statusfull  Family Communication: none  Disposition:   Status is: Inpatient  Remains inpatient appropriate because:Inpatient level of care appropriate due to severity of illness  Dispo: The patient is from: Home  Anticipated d/c is to: Home              Patient currently is not medically stable to d/c.   Difficult to place patient No       Consultants:  orthopedics  Procedures:  Excision soft tissue mass L thigh 9/14  Antimicrobials:  Anti-infectives (From admission, onward)    None          Subjective: Denies any complaints at this  time  Objective: Vitals:   12/22/20 0758 12/22/20 1239 12/22/20 1840 12/22/20 1842  BP: 114/68 128/65 117/73   Pulse: 86 62 79   Resp: 18 18 18    Temp: 97.8 F (36.6 C) 98.2 F (36.8 C) 98.1 F (36.7 C)   TempSrc: Oral Oral Oral   SpO2: 97% 98% 93% 100%  Weight:      Height:        Intake/Output Summary (Last 24 hours) at 12/22/2020 1854 Last data filed at 12/22/2020 1500 Gross per 24 hour  Intake 500.72 ml  Output --  Net 500.72 ml   Filed Weights   12/14/20 1234  Weight: 110.2 kg    Examination:  General: No acute distress. Cardiovascular:RRr Lungs: unlabored Abdomen: Soft, nontender, nondistended . Neurological: Alert and oriented 3. Moves all extremities 4  Cranial nerves II through XII grossly intact. Skin: Warm and dry. No rashes or lesions. Extremities: dressing intact to LLE, mild swelling noted    Data Reviewed: I have personally reviewed following labs and imaging studies  CBC: Recent Labs  Lab 12/17/20 0847 12/18/20 0133 12/19/20 0414 12/20/20 0447 12/21/20 0218 12/21/20 2000 12/22/20 0147  WBC 10.3 6.9 7.1 8.7 9.4  --  10.6*  NEUTROABS 8.0* 4.3  --   --   --   --  7.1  HGB 7.5* 7.8* 7.9* 8.3* 8.4* 9.2* 9.0*  HCT 23.1* 24.6* 25.0* 26.1* 26.5* 28.7* 28.5*  MCV 102.2* 101.7* 102.0* 102.8* 102.7*  --  103.6*  PLT 226 232 262 292 304  --  175    Basic Metabolic Panel: Recent Labs  Lab 12/17/20 0847 12/18/20 0133 12/19/20 0604 12/21/20 0218 12/22/20 0147  NA 133* 136 138 137 136  K 4.1 3.7 3.7 3.5 3.5  CL 98 100 101 98 99  CO2 28 29 30 30 30   GLUCOSE 186* 98 102* 98 107*  BUN 15 13 14 17 18   CREATININE 1.07* 1.00 0.98 1.07* 1.13*  CALCIUM 8.3* 8.4* 8.6* 8.7* 8.9  MG  --  1.9 1.9 1.9 2.0  PHOS  --  3.7 3.6 3.5 3.5    GFR: Estimated Creatinine Clearance: 53.2 mL/min (Lajoy [place]) (by C-G formula based on SCr of 1.13 mg/dL (H)).  Liver Function Tests: Recent Labs  Lab 12/17/20 0847 12/18/20 0133 12/19/20 0604 12/22/20 0147  AST 12*  14* 14* 18  ALT 11 10 9 10   ALKPHOS 58 58 61 69  BILITOT 1.2 1.5* 1.1 1.3*  PROT 5.8* 5.9* 6.2* 6.5  ALBUMIN 2.4* 2.5* 2.5* 2.7*    CBG: No results for input(s): GLUCAP in the last 168 hours.   Recent Results (from the past 240 hour(s))  Resp Panel by RT-PCR (Flu Emrik Erhard&B, Covid) Nasopharyngeal Swab     Status: None   Collection Time: 12/14/20  4:38 PM   Specimen: Nasopharyngeal Swab; Nasopharyngeal(NP) swabs in vial transport medium  Result Value Ref Range Status   SARS Coronavirus 2 by RT PCR NEGATIVE NEGATIVE Final    Comment: (NOTE) SARS-CoV-2 target nucleic acids are NOT DETECTED.  The SARS-CoV-2 RNA is generally detectable in upper respiratory specimens during the acute phase of infection. The lowest concentration of SARS-CoV-2 viral copies this assay can detect is 138 copies/mL. Shavaughn Seidl negative result does not preclude SARS-Cov-2 infection and should not be used as the sole basis for treatment or other patient management decisions. Domique Reardon negative result may occur with  improper specimen collection/handling, submission of specimen other than nasopharyngeal swab, presence of viral mutation(s) within the areas targeted by this assay, and inadequate number of viral copies(<138 copies/mL). Breydon Senters negative result must be combined with clinical observations, patient history, and epidemiological information. The expected result is Negative.  Fact Sheet for Patients:  EntrepreneurPulse.com.au  Fact Sheet for Healthcare Providers:  IncredibleEmployment.be  This test is no t yet approved or cleared by the [place] FDA and  has been authorized for detection and/or diagnosis of SARS-CoV-2 by FDA under an Emergency Use Authorization (EUA). This EUA will remain  in effect (meaning this test can be used) for the duration of the COVID-19 declaration under Section 564(b)(1) of the Act, 21 U.S.C.section 360bbb-3(b)(1), unless the authorization is terminated  or  revoked sooner.       Influenza Marguerita Stapp by PCR NEGATIVE NEGATIVE Final   Influenza B by PCR NEGATIVE NEGATIVE Final    Comment: (NOTE) The Xpert Xpress SARS-CoV-2/FLU/RSV plus assay is intended as an aid in the diagnosis of influenza from Nasopharyngeal swab specimens and should not be used as Asia Favata sole basis for treatment. Nasal washings and aspirates are unacceptable for Xpert Xpress SARS-CoV-2/FLU/RSV testing.  Fact Sheet for Patients: EntrepreneurPulse.com.au  Fact Sheet for Healthcare Providers: IncredibleEmployment.be  This test is not yet approved or cleared by the [place] FDA and has been authorized for detection and/or diagnosis of SARS-CoV-2 by FDA under an Emergency Use Authorization (EUA). This EUA will remain in effect (meaning this test can be used) for the duration of the COVID-19 declaration under Section 564(b)(1) of the Act, 21 U.S.C. section 360bbb-3(b)(1), unless the authorization is terminated or revoked.  Performed at [facility], [place] [address] [place], [place] [address]          Radiology Studies: No results found.      Scheduled Meds:  calcium-vitamin D   Oral BID   cholecalciferol  2,000 Units Oral Daily   enoxaparin (LOVENOX) injection  110 mg Subcutaneous Q12H   ferrous sulfate  325 mg Oral Q breakfast   FLUoxetine  20 mg Oral Daily   fluticasone  2 spray Each Nare Daily   furosemide  40 mg Oral Daily   metoprolol tartrate  25 mg Oral BID   potassium chloride  10 mEq Oral Daily   sodium chloride flush  3 mL Intravenous Q12H   Warfarin - Pharmacist Dosing Inpatient   Does not apply q1600   Continuous Infusions:  sodium chloride     sodium chloride       LOS: 7 days    Time spent: over 30 min    Fayrene Helper, MD [facility]   To contact the attending provider between 7A-7P or the covering provider during after hours 7P-7A, please log into the web site [email or web address] and  access using universal [place] password for that web site. If you do not have the password, please call the hospital operator.  12/22/2020, 6:54 PM

## 2020-12-22 NOTE — Plan of Care (Signed)

## 2020-12-22 NOTE — Progress Notes (Signed)
Patient ID: [name], female   DOB: July 20, 1945, 75 y.o.   MRN: [id number] Patient is seen in follow-up for debridement left hip wound.  The surgical incision is well approximated there is no cellulitis there is a small amount of resolving hematoma.  Would recommend continue with dry dressing changes daily washing the leg with soap and water and continue with the Ace circumferential compression to help decrease the swelling.  The final pathology indicates hematoma with associated necrotic soft tissue without malignancy.

## 2020-12-23 LAB — CBC WITH DIFFERENTIAL/PLATELET
Abs Immature Granulocytes: 0.04 10*3/uL (ref 0.00–0.07)
Basophils Absolute: 0 10*3/uL (ref 0.0–0.1)
Basophils Relative: 1 %
Eosinophils Absolute: 0.3 10*3/uL (ref 0.0–0.5)
Eosinophils Relative: 3 %
HCT: 27.6 % — ABNORMAL LOW (ref 36.0–46.0)
Hemoglobin: 8.7 g/dL — ABNORMAL LOW (ref 12.0–15.0)
Immature Granulocytes: 1 %
Lymphocytes Relative: 24 %
Lymphs Abs: 2 10*3/uL (ref 0.7–4.0)
MCH: 32.8 pg (ref 26.0–34.0)
MCHC: 31.5 g/dL (ref 30.0–36.0)
MCV: 104.2 fL — ABNORMAL HIGH (ref 80.0–100.0)
Monocytes Absolute: 0.7 10*3/uL (ref 0.1–1.0)
Monocytes Relative: 8 %
Neutro Abs: 5.3 10*3/uL (ref 1.7–7.7)
Neutrophils Relative %: 63 %
Platelets: 321 10*3/uL (ref 150–400)
RBC: 2.65 MIL/uL — ABNORMAL LOW (ref 3.87–5.11)
RDW: 16.3 % — ABNORMAL HIGH (ref 11.5–15.5)
WBC: 8.4 10*3/uL (ref 4.0–10.5)
nRBC: 0.2 % (ref 0.0–0.2)

## 2020-12-23 LAB — COMPREHENSIVE METABOLIC PANEL
ALT: 10 U/L (ref 0–44)
AST: 23 U/L (ref 15–41)
Albumin: 2.7 g/dL — ABNORMAL LOW (ref 3.5–5.0)
Alkaline Phosphatase: 67 U/L (ref 38–126)
Anion gap: 6 (ref 5–15)
BUN: 14 mg/dL (ref 8–23)
CO2: 32 mmol/L (ref 22–32)
Calcium: 9 mg/dL (ref 8.9–10.3)
Chloride: 99 mmol/L (ref 98–111)
Creatinine, Ser: 1.08 mg/dL — ABNORMAL HIGH (ref 0.44–1.00)
GFR, Estimated: 54 mL/min — ABNORMAL LOW (ref >60)
Glucose, Bld: 91 mg/dL (ref 70–99)
Potassium: 3.7 mmol/L (ref 3.5–5.1)
Sodium: 137 mmol/L (ref 135–145)
Total Bilirubin: 1 mg/dL (ref 0.3–1.2)
Total Protein: 6.4 g/dL — ABNORMAL LOW (ref 6.5–8.1)

## 2020-12-23 LAB — PROTIME-INR
INR: 2.7 — ABNORMAL HIGH (ref 0.8–1.2)
Prothrombin Time: 28.8 seconds — ABNORMAL HIGH (ref 11.4–15.2)

## 2020-12-23 LAB — PHOSPHORUS: Phosphorus: 3.7 mg/dL (ref 2.5–4.6)

## 2020-12-23 LAB — MAGNESIUM: Magnesium: 2.1 mg/dL (ref 1.7–2.4)

## 2020-12-23 MED ORDER — WARFARIN SODIUM 4 MG PO TABS
4.5000 mg | ORAL_TABLET | Freq: Once | ORAL | Status: DC
  Filled 2020-12-23: qty 1

## 2020-12-23 MED ORDER — SENNA 8.6 MG PO TABS
2.0000 | ORAL_TABLET | Freq: Two times a day (BID) | ORAL | 0 refills | Status: DC | PRN
Start: 2020-12-23 — End: 2020-12-23

## 2020-12-23 MED ORDER — FERROUS SULFATE 325 (65 FE) MG PO TABS: 325.0000 mg | ORAL_TABLET | Freq: Every day | ORAL | 0 refills | Status: DC

## 2020-12-23 MED ORDER — FERROUS SULFATE 325 (65 FE) MG PO TABS: 325.0000 mg | ORAL_TABLET | Freq: Every day | ORAL | 3 refills | Status: DC

## 2020-12-23 MED ORDER — HYDROCODONE-ACETAMINOPHEN 5-325 MG PO TABS: 1.0000 | ORAL_TABLET | ORAL | 0 refills | Status: DC | PRN

## 2020-12-23 MED ORDER — METHOCARBAMOL 500 MG PO TABS: 500.0000 mg | ORAL_TABLET | Freq: Four times a day (QID) | ORAL | 0 refills | Status: DC | PRN

## 2020-12-23 MED ORDER — SENNA 8.6 MG PO TABS: 2.0000 | ORAL_TABLET | Freq: Two times a day (BID) | ORAL | 0 refills | Status: DC | PRN

## 2020-12-23 NOTE — Progress Notes (Signed)
Physical Therapy Treatment Patient Details Name: ARIEON SCALZO MRN: [id number] DOB: 07/22/1945 Today's Date: 12/23/2020   History of Present Illness 75 y.o. female presents to [facility] ED on 12/14/2020 with bleeding from wound, recently undergoing L thigh tumor excision on 9/14. Pt admitted for management of postoperative complication symptomatic acute blood loss anemia. PMH includes atrial fibrillation on warfarin, history of PE and DVT.    PT Comments    Patient received in recliner, pleasant and cooperative with therapy. Orthostatics negative but she did become a bit tachycardic with mobility, tolerated gait training into the bathroom with cues for proximity to RW and needed some extra time to have a BM. Fatigued after using bathroom and declined further gait training or step practice. Left up in recliner with all needs met, family present. Will continue efforts while she remains in house.    Recommendations for follow up therapy are one component of a multi-disciplinary discharge planning process, led by the attending physician.  Recommendations may be updated based on patient status, additional functional criteria and insurance authorization.  Follow Up Recommendations  Home health PT;Supervision for mobility/OOB     Equipment Recommendations  None recommended by PT    Recommendations for Other Services       Precautions / Restrictions Precautions Precautions: Fall Precaution Comments: L thigh wound Restrictions Weight Bearing Restrictions: Yes     Mobility  Bed Mobility               General bed mobility comments: up in recliner upon entry    Transfers Overall transfer level: Needs assistance Equipment used: Rolling walker (2 wheeled) Transfers: Sit to/from Stand Sit to Stand: Min guard         General transfer comment: generally weak but min guard only needed for safety, no physical assist given  Ambulation/Gait Ambulation/Gait assistance: Min guard Gait  Distance (Feet): 24 Feet (33fx2) Assistive device: Rolling walker (2 wheeled) Gait Pattern/deviations: Trunk flexed;Decreased stance time - right;Step-through pattern;Decreased stride length;Wide base of support Gait velocity: Decreased   General Gait Details: able to gait train in room with min guard, did need cues for safe proximity to RW especially when she was rushing to get to the bathroom; fatigued after walking to/from bathroom so deferred further gait training and steps today   Stairs             Wheelchair Mobility    Modified Rankin (Stroke Patients Only)       Balance Overall balance assessment: Needs assistance Sitting-balance support: Feet unsupported;Single extremity supported Sitting balance-Leahy Scale: Good     Standing balance support: Bilateral upper extremity supported;During functional activity Standing balance-Leahy Scale: Fair Standing balance comment: Require BUE on RW                            Cognition Arousal/Alertness: Awake/alert Behavior During Therapy: WFL for tasks assessed/performed Overall Cognitive Status: Within Functional Limits for tasks assessed                                        Exercises      General Comments        Pertinent Vitals/Pain Pain Assessment: Faces Faces Pain Scale: Hurts a little bit Pain Location: L thigh with movement Pain Descriptors / Indicators: Aching;Sore Pain Intervention(s): Limited activity within patient's tolerance;Monitored during session    Home Living  Prior Function            PT Goals (current goals can now be found in the care plan section) Acute Rehab PT Goals Patient Stated Goal: to go home and improve activity tolerance PT Goal Formulation: With patient Time For Goal Achievement: 12/30/20 Potential to Achieve Goals: Good Progress towards PT goals: Progressing toward goals    Frequency    Min 3X/week       PT Plan Current plan remains appropriate    Co-evaluation              AM-PAC PT "6 Clicks" Mobility   Outcome Measure  Help needed turning from your back to your side while in a flat bed without using bedrails?: A Little Help needed moving from lying on your back to sitting on the side of a flat bed without using bedrails?: A Little Help needed moving to and from a bed to a chair (including a wheelchair)?: A Little Help needed standing up from a chair using your arms (e.g., wheelchair or bedside chair)?: A Little Help needed to walk in hospital room?: A Little Help needed climbing 3-5 steps with a railing? : A Lot 6 Click Score: 17    End of Session Equipment Utilized During Treatment: Gait belt Activity Tolerance: Patient limited by fatigue Patient left: in chair;with call bell/phone within reach;with family/visitor present Nurse Communication: Mobility status PT Visit Diagnosis: Other abnormalities of gait and mobility (R26.89);Muscle weakness (generalized) (M62.81) Pain - Right/Left: Left Pain - part of body:  (thigh)     Time: 1686-1042 PT Time Calculation (min) (ACUTE ONLY): 32 min  Charges:  $Gait Training: 8-22 mins $Therapeutic Activity: 8-22 mins                    Windell Norfolk, DPT, PN2   Supplemental Physical Therapist [place]    Pager [phone] Acute Rehab Office [phone]

## 2020-12-23 NOTE — Progress Notes (Signed)
[place] for Lovenox / Coumadin Indication: history of PE/DVT, history of CVA/Afib, Lupus anticoagulant   Allergies  Allergen Reactions   Heparin Hives    Patient attests severe allergy- rash all over, severe itching, unable to take heparin   Other Itching and Rash    "Sheets and Linens used by Zacarias Pontes"    Patient Measurements: Height: 5\' 5"  (165.1 cm) Weight: 110.2 kg (243 lb) IBW/kg (Calculated) : 57  Vital Signs: Temp: 97.9 F (36.6 C) (09/28 0700) Temp Source: Oral (09/28 0700) BP: 125/81 (09/28 0700) Pulse Rate: 63 (09/28 0700)  Labs: Recent Labs    12/21/20 0218 12/21/20 2000 12/22/20 0147 12/22/20 0911 12/23/20 0313  HGB 8.4* 9.2* 9.0*  --  8.7*  HCT 26.5* 28.7* 28.5*  --  27.6*  PLT 304  --  321  --  321  LABPROT 20.6*  --  21.2*  --  28.8*  INR 1.8*  --  1.8*  --  2.7*  CREATININE 1.07*  --  1.13*  --  1.08*  TROPONINIHS  --  42*  --  41*  --      Estimated Creatinine Clearance: 55.6 mL/min (A) (by C-G formula based on SCr of 1.08 mg/dL (H)).   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 INR 1.7 on admission Was on LMWH post-op until admission for bleeding 9/19.  Has heparin allergy but not HIT  Home regimen: Coumadin 4.5mg  daily except 6mg  on Wednesdays. (Has 3mg  tablets, TTR 52% mostly low INRs)  Resumed Coumadin 9/22. INR is 2.7, up from 1.8, and at goal. Per ortho, small amount of resolving hematoma. H/H, plt stable. Eating 100%.   Goal of Therapy:   Monitor platelets by anticoagulation protocol: Yes INR Goal: 2.5-3.5 per previous  anticoagulation notes   Plan:  Stop Lovenox   Warfarin 4.5mg  x1 Monitor daily INR, weekly CBC  Monitor for signs/symptoms of bleeding  For discharge, suggest resuming home dose of 4.5mg  daily except 6mg  on Wednesdays with INR check by Monday 10/3   Benetta Spar, PharmD, BCPS, BCCP Clinical Pharmacist  Please check AMION for all [place] phone numbers After 10:00 PM, call [place] [phone]

## 2020-12-23 NOTE — TOC Transition Note (Addendum)
Transition of Care [facility]) - CM/SW Discharge Note   Patient Details  Name: [name] MRN: [id number] Date of Birth: 06-Jun-1945  [facility]) CM/SW Contact:  Sharin Mons, RN Phone Number: 12/23/2020, 10:44 AM   Clinical Narrative:    Patient will DC to: home Anticipated DC date: 12/23/2020 Family notified: yes Transport by:car  Per MD patient ready for DC today. RN, patient, patient's family, and facility notified of DC.  Resumption of home health services will be provided by [facility] post d/c., start of care within 48 post d/c. Pt without DME needs. Daughter Colletta [place] to provide transportation to home. [name] (Daughter)      [phone]      Post hospital f/u noted on AVS.  RNCM will sign off for now as intervention is no longer needed. Please consult [place] again if new needs arise.    Final next level of care: Home w Home Health Services Barriers to Discharge: No Barriers Identified   Patient Goals and CMS Choice Patient states their goals for this hospitalization and ongoing recovery are:: to return to daughters house.   Choice offered to / list presented to : Patient  Discharge Placement                       Discharge Plan and Services In-house Referral: NA Discharge Planning Services: CM Consult Post Acute Care Choice: Home Health          DME Arranged: N/A DME Agency: NA       HH Arranged: OT, PT, RN, Nurse's Aide [place] Agency: [place] ([facility]) Date HH Agency Contacted: 12/23/20 Time [place]: 1003 Representative spoke with at [place]: [place] ([place]) Interventions     Readmission Risk Interventions No flowsheet data found.

## 2020-12-23 NOTE — Plan of Care (Signed)

## 2020-12-23 NOTE — Consult Note (Signed)
   [facility] Inpatient Consult   12/23/2020  [name] [birth year] [id number]  [place] [facility] [ACO] Patient:  Medicare CMS DCE  Primary Care Provider:  Josetta Huddle, MD [facility] Physician  Patient screened for length of stay hospitalization and disposition for post hospital transition.  Review of patient's medical record reveals patient is recommended for home with home health care.  Plan:  Patient could benefit from [employer] follow up for post hospital transition support.     For questions contact:   Natividad Brood, RN BSN [facility] Liaison  [phone] business mobile phone Toll free office [phone]  Fax number: [phone] [email or web address]

## 2020-12-23 NOTE — Discharge Summary (Addendum)
Physician Discharge Summary  [name] EFE:[id number] DOB: 1946/01/14 DOA: 12/14/2020  PCP: Josetta Huddle, MD  Admit date: 12/14/2020 Discharge date: 12/23/2020  Admitted From: Home Disposition: Home with [facility]  Recommendations for Outpatient Follow-up:  Follow up with PCP in 1-2 weeks Please obtain BMP/CBC in one week your next doctors visit.   Would recommend continue with dry dressing changes daily washing the leg with soap and water and continue with the Ace circumferential compression to help decrease the swelling. Follow-up patient orthopedic, Dr. Sharol Given in 1-2 weeks Losartan discontinued for now, slowly reintroduce when appropriate.  Discharge Condition: Stable CODE STATUS: Full  Diet recommendation: Heart Healthy.   Brief/Interim Summary: 75 yo with hx atrial fibrillation on warfarin, hx PT/DVT, hx lupus anticoagulant, with recent surgery with Dr. Sharol Given for the excision of a left thigh mass represents with acute blood loss anemia and post operative bleeding.  She was on lovenox bridge with warfarin at the time of admission.   See below for additional details  Patient is no longer orthostatic and doing better.     Assessment & Plan:   Principal Problem:   Symptomatic anemia Active Problems:   History of pulmonary embolism   Permanent atrial fibrillation (HCC)   History of CVA (cerebrovascular accident)   Essential hypertension   CHF (congestive heart failure) (HCC)   Bleeding from wound   Acute blood loss anemia   Lupus anticoagulant syndrome (HCC)   Acute Blood Loss Anemia  Post Operative Blood Loss -Status post PRBC transfusion 2 units.  Hemoglobin now remained stable.  Dressing appears to be stable.  Dressing changes recommendations as stated above.  Follow-up outpatient orthopedic doctor due to an 1-2 weeks.  No further bleeding noted at this time.   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  Continue outpatient Coumadin, discontinue Lovenox.  Large soft tissue left hip mass status post excision by Dr. Sharol Given on 12/09/2020 - Follow-up patient   Hx DVT  PE -Continue Coumadin   Hx CVA -On Coumadin   Atrial fibrillation -Coumadin and metoprolol    Body mass index is 40.44 kg/m.  Pressure Injury 03/24/17 (Active)  03/24/17 0200  Location: Sacrum  Location Orientation:   Staging:   Wound Description (Comments):   Present on Admission:         Discharge Diagnoses:  Principal Problem:   Symptomatic anemia Active Problems:   History of pulmonary embolism   Permanent atrial fibrillation (HCC)   History of CVA (cerebrovascular accident)   Essential hypertension   CHF (congestive heart failure) (HCC)   Bleeding from wound   Acute blood loss anemia   Lupus anticoagulant syndrome ([place])      Consultations: Orthopedic  Subjective: Patient feels great no complaints.  Orthostasis appears to have resolved this morning.  She is excited to be discharged.  No evidence of bleeding from the surgical site.  Discharge Exam: Vitals:   12/23/20 0700 12/23/20 0900  BP: 125/81 122/61  Pulse: 63   Resp:    Temp: 97.9 F (36.6 C) 97.7 F (36.5 C)  SpO2: 100% 100%   Vitals:   12/22/20 1842 12/22/20 2021 12/23/20 0700 12/23/20 0900  BP:  132/68 125/81 122/61  Pulse:  83 63   Resp:  17    Temp:  98.2 F (36.8 C) 97.9 F (  36.6 C) 97.7 F (36.5 C)  TempSrc:  Oral Oral Oral  SpO2: 100% 98% 100% 100%  Weight:      Height:        General: Pt is alert, awake, not in acute distress Cardiovascular: RRR, S1/S2 +, no rubs, no gallops Respiratory: CTA bilaterally, no wheezing, no rhonchi Abdominal: Soft, NT, ND, bowel sounds + Extremities: no edema, no cyanosis Left knee dressing in place  Discharge Instructions  Discharge Instructions      Discharge wound care:   Complete by: As directed    Would recommend continue with dry dressing changes daily washing the leg with soap and water and continue with the Ace circumferential compression to help decrease the swelling.      Allergies as of 12/23/2020       Reactions   Heparin Hives   Patient attests severe allergy- rash all over, severe itching, unable to take heparin   Other Itching, Rash   "Sheets and Linens used by Zacarias Pontes"        Medication List     STOP taking these medications    enoxaparin 120 MG/0.8ML injection Commonly known as: LOVENOX   losartan 100 MG tablet Commonly known as: COZAAR       TAKE these medications    aspirin 81 MG tablet Take 81 mg by mouth daily.   CALCIUM 600 + D PO Take 1 tablet by mouth 2 (two) times daily.   ferrous sulfate 325 (65 FE) MG tablet Take 1 tablet (325 mg total) by mouth daily with breakfast. Start taking on: December 24, 2020   FLUoxetine 20 MG capsule Commonly known as: PROZAC Take 20 mg by mouth daily.   furosemide 40 MG tablet Commonly known as: LASIX Take 1 tablet by mouth once daily   HYDROcodone-acetaminophen 5-325 MG tablet Commonly known as: NORCO/VICODIN Take 1 tablet by mouth every 4 (four) hours as needed for moderate pain.   lidocaine 5 % Commonly known as: Lidoderm Place 1 patch onto the skin daily. Remove & Discard patch within 12 hours or as directed by MD   methocarbamol 500 MG tablet Commonly known as: ROBAXIN Take 1 tablet (500 mg total) by mouth every 6 (six) hours as needed for muscle spasms.   metoprolol tartrate 25 MG tablet Commonly known as: LOPRESSOR Take 1 tablet by mouth twice daily   potassium chloride 10 MEQ tablet Commonly known as: KLOR-CON TAKE 1 TABLET BY MOUTH ONCE DAILY   senna 8.6 MG Tabs tablet Commonly known as: SENOKOT Take 2 tablets (17.2 mg total) by mouth 2 (two) times daily as needed for mild constipation or moderate constipation.   Vitamin D  50 MCG (2000 UT) tablet Take 2,000 Units by mouth daily.   warfarin 3 MG tablet Commonly known as: COUMADIN Take as directed. If you are unsure how to take this medication, talk to your nurse or doctor. Original instructions: TAKE 1 TO 1&1/2 TABLETS BY MOUTH DAILY AS DIRECTED BY COUMADIN CLINIC What changed:  how much to take how to take this when to take this additional instructions               Discharge Care Instructions  (From admission, onward)           Start     Ordered   12/23/20 0000  Discharge wound care:       Comments: Would recommend continue with dry dressing changes daily washing the leg with soap and water and continue with  the Ace circumferential compression to help decrease the swelling.   12/23/20 1031            Follow-up Information     [facility] Follow up.   Specialty: Home Health Services Why: Registered Nurse, Physical Therapy, Occupational Therapy, Aide-office to call the daughter with a visit time.        Josetta Huddle, MD. Schedule an appointment as soon as possible for a visit.   Specialty: Internal Medicine Contact information: [address] [place] [address] [phone]         Minus Breeding, MD .   Specialty: Cardiology Contact information: [address] [place] [address] [phone]                Allergies  Allergen Reactions   Heparin Hives    Patient attests severe allergy- rash all over, severe itching, unable to take heparin   Other Itching and Rash    "Sheets and Linens used by Zacarias Pontes"    You were cared for by a hospitalist during your hospital stay. If you have any questions about your discharge medications or the care you received while you were in the hospital after you are discharged, you can call the unit and asked to speak with the hospitalist on call if the hospitalist that took care of you is not available. Once you are  discharged, your primary care physician will handle any further medical issues. Please note that no refills for any discharge medications will be authorized once you are discharged, as it is imperative that you return to your primary care physician (or establish a relationship with a primary care physician if you do not have one) for your aftercare needs so that they can reassess your need for medications and monitor your lab values.   Procedures/Studies: DG CHEST PORT 1 VIEW  Result Date: 12/18/2020 CLINICAL DATA:  Cough EXAM: PORTABLE CHEST 1 VIEW COMPARISON:  09/03/2020 FINDINGS: Cardiomegaly. No focal opacity, pleural effusion, or pneumothorax. Aortic atherosclerosis. IMPRESSION: No active disease.  Cardiomegaly Electronically Signed   By: Donavan Foil M.D.   On: 12/18/2020 21:02     The results of significant diagnostics from this hospitalization (including imaging, microbiology, ancillary and laboratory) are listed below for reference.     Microbiology: Recent Results (from the past 240 hour(s))  Resp Panel by RT-PCR (Flu A&B, Covid) Nasopharyngeal Swab     Status: None   Collection Time: 12/14/20  4:38 PM   Specimen: Nasopharyngeal Swab; Nasopharyngeal(NP) swabs in vial transport medium  Result Value Ref Range Status   SARS Coronavirus 2 by RT PCR NEGATIVE NEGATIVE Final    Comment: (NOTE) SARS-CoV-2 target nucleic acids are NOT DETECTED.  The SARS-CoV-2 RNA is generally detectable in upper respiratory specimens during the acute phase of infection. The lowest concentration of SARS-CoV-2 viral copies this assay can detect is 138 copies/mL. A negative result does not preclude SARS-Cov-2 infection and should not be used as the sole basis for treatment or other patient management decisions. A negative result may occur with  improper specimen collection/handling, submission of specimen other than nasopharyngeal swab, presence of viral mutation(s) within the areas targeted by this  assay, and inadequate number of viral copies(<138 copies/mL). A negative result must be combined with clinical observations, patient history, and epidemiological information. The expected result is Negative.  Fact Sheet for Patients:  EntrepreneurPulse.com.au  Fact Sheet for Healthcare Providers:  IncredibleEmployment.be  This test is no  t yet approved or cleared by the [place] and  has been authorized for detection and/or diagnosis of SARS-CoV-2 by FDA under an Emergency Use Authorization (EUA). This EUA will remain  in effect (meaning this test can be used) for the duration of the COVID-19 declaration under Section 564(b)(1) of the Act, 21 U.S.C.section 360bbb-3(b)(1), unless the authorization is terminated  or revoked sooner.       Influenza A by PCR NEGATIVE NEGATIVE Final   Influenza B by PCR NEGATIVE NEGATIVE Final    Comment: (NOTE) The Xpert Xpress SARS-CoV-2/FLU/RSV plus assay is intended as an aid in the diagnosis of influenza from Nasopharyngeal swab specimens and should not be used as a sole basis for treatment. Nasal washings and aspirates are unacceptable for Xpert Xpress SARS-CoV-2/FLU/RSV testing.  Fact Sheet for Patients: EntrepreneurPulse.com.au  Fact Sheet for Healthcare Providers: IncredibleEmployment.be  This test is not yet approved or cleared by the [place] FDA and has been authorized for detection and/or diagnosis of SARS-CoV-2 by FDA under an Emergency Use Authorization (EUA). This EUA will remain in effect (meaning this test can be used) for the duration of the COVID-19 declaration under Section 564(b)(1) of the Act, 21 U.S.C. section 360bbb-3(b)(1), unless the authorization is terminated or revoked.  Performed at [facility], [place] [address] [place], [place] [address]      Labs: BNP (last 3 results) Recent Labs    09/04/20 0907  BNP 540.9*    Basic Metabolic Panel: Recent Labs  Lab 12/18/20 0133 12/19/20 0604 12/21/20 0218 12/22/20 0147 12/23/20 0313  NA 136 138 137 136 137  K 3.7 3.7 3.5 3.5 3.7  CL 100 101 98 99 99  CO2 29 30 30 30  32  GLUCOSE 98 102* 98 107* 91  BUN 13 14 17 18 14   CREATININE 1.00 0.98 1.07* 1.13* 1.08*  CALCIUM 8.4* 8.6* 8.7* 8.9 9.0  MG 1.9 1.9 1.9 2.0 2.1  PHOS 3.7 3.6 3.5 3.5 3.7   Liver Function Tests: Recent Labs  Lab 12/17/20 0847 12/18/20 0133 12/19/20 0604 12/22/20 0147 12/23/20 0313  AST 12* 14* 14* 18 23  ALT 11 10 9 10 10   ALKPHOS 58 58 61 69 67  BILITOT 1.2 1.5* 1.1 1.3* 1.0  PROT 5.8* 5.9* 6.2* 6.5 6.4*  ALBUMIN 2.4* 2.5* 2.5* 2.7* 2.7*   No results for input(s): LIPASE, AMYLASE in the last 168 hours. No results for input(s): AMMONIA in the last 168 hours. CBC: Recent Labs  Lab 12/17/20 0847 12/18/20 0133 12/19/20 0414 12/20/20 0447 12/21/20 0218 12/21/20 2000 12/22/20 0147 12/23/20 0313  WBC 10.3 6.9 7.1 8.7 9.4  --  10.6* 8.4  NEUTROABS 8.0* 4.3  --   --   --   --  7.1 5.3  HGB 7.5* 7.8* 7.9* 8.3* 8.4* 9.2* 9.0* 8.7*  HCT 23.1* 24.6* 25.0* 26.1* 26.5* 28.7* 28.5* 27.6*  MCV 102.2* 101.7* 102.0* 102.8* 102.7*  --  103.6* 104.2*  PLT 226 232 262 292 304  --  321 321   Cardiac Enzymes: No results for input(s): CKTOTAL, CKMB, CKMBINDEX, TROPONINI in the last 168 hours. BNP: Invalid input(s): POCBNP CBG: No results for input(s): GLUCAP in the last 168 hours. D-Dimer No results for input(s): DDIMER in the last 72 hours. Hgb A1c No results for input(s): HGBA1C in the last 72 hours. Lipid Profile No results for input(s): CHOL, HDL, LDLCALC, TRIG, CHOLHDL, LDLDIRECT in the last 72 hours. Thyroid function studies No results for input(s): TSH,  T4TOTAL, T3FREE, THYROIDAB in the last 72 hours.  Invalid input(s): FREET3 Anemia work up No results for input(s): VITAMINB12, FOLATE, FERRITIN, TIBC, IRON, RETICCTPCT in the last 72 hours. Urinalysis    Component  Value Date/Time   COLORURINE STRAW (A) 09/04/2020 2116   APPEARANCEUR CLEAR 09/04/2020 2116   LABSPEC 1.005 09/04/2020 2116   PHURINE 7.0 09/04/2020 2116   GLUCOSEU NEGATIVE 09/04/2020 2116   HGBUR MODERATE (A) 09/04/2020 2116   BILIRUBINUR NEGATIVE 09/04/2020 2116   KETONESUR NEGATIVE 09/04/2020 2116   PROTEINUR NEGATIVE 09/04/2020 2116   UROBILINOGEN 1.0 07/07/2010 1500   NITRITE NEGATIVE 09/04/2020 2116   LEUKOCYTESUR NEGATIVE 09/04/2020 2116   Sepsis Labs Invalid input(s): PROCALCITONIN,  WBC,  LACTICIDVEN Microbiology Recent Results (from the past 240 hour(s))  Resp Panel by RT-PCR (Flu A&B, Covid) Nasopharyngeal Swab     Status: None   Collection Time: 12/14/20  4:38 PM   Specimen: Nasopharyngeal Swab; Nasopharyngeal(NP) swabs in vial transport medium  Result Value Ref Range Status   SARS Coronavirus 2 by RT PCR NEGATIVE NEGATIVE Final    Comment: (NOTE) SARS-CoV-2 target nucleic acids are NOT DETECTED.  The SARS-CoV-2 RNA is generally detectable in upper respiratory specimens during the acute phase of infection. The lowest concentration of SARS-CoV-2 viral copies this assay can detect is 138 copies/mL. A negative result does not preclude SARS-Cov-2 infection and should not be used as the sole basis for treatment or other patient management decisions. A negative result may occur with  improper specimen collection/handling, submission of specimen other than nasopharyngeal swab, presence of viral mutation(s) within the areas targeted by this assay, and inadequate number of viral copies(<138 copies/mL). A negative result must be combined with clinical observations, patient history, and epidemiological information. The expected result is Negative.  Fact Sheet for Patients:  EntrepreneurPulse.com.au  Fact Sheet for Healthcare Providers:  IncredibleEmployment.be  This test is no t yet approved or cleared by the [place] FDA and   has been authorized for detection and/or diagnosis of SARS-CoV-2 by FDA under an Emergency Use Authorization (EUA). This EUA will remain  in effect (meaning this test can be used) for the duration of the COVID-19 declaration under Section 564(b)(1) of the Act, 21 U.S.C.section 360bbb-3(b)(1), unless the authorization is terminated  or revoked sooner.       Influenza A by PCR NEGATIVE NEGATIVE Final   Influenza B by PCR NEGATIVE NEGATIVE Final    Comment: (NOTE) The Xpert Xpress SARS-CoV-2/FLU/RSV plus assay is intended as an aid in the diagnosis of influenza from Nasopharyngeal swab specimens and should not be used as a sole basis for treatment. Nasal washings and aspirates are unacceptable for Xpert Xpress SARS-CoV-2/FLU/RSV testing.  Fact Sheet for Patients: EntrepreneurPulse.com.au  Fact Sheet for Healthcare Providers: IncredibleEmployment.be  This test is not yet approved or cleared by the [place] FDA and has been authorized for detection and/or diagnosis of SARS-CoV-2 by FDA under an Emergency Use Authorization (EUA). This EUA will remain in effect (meaning this test can be used) for the duration of the COVID-19 declaration under Section 564(b)(1) of the Act, 21 U.S.C. section 360bbb-3(b)(1), unless the authorization is terminated or revoked.  Performed at [facility], [place] [address], [place], [place] [address]      Time coordinating discharge:  I have spent 35 minutes face to face with the patient and on the ward discussing the patients care, assessment, plan and disposition with other care givers. >50% of the time was devoted counseling the  patient about the risks and benefits of treatment/Discharge disposition and coordinating care.   SIGNED:   Damita Lack, MD  [facility] 12/23/2020, 12:51 PM   If 7PM-7AM, please contact night-coverage

## 2020-12-24 ENCOUNTER — Telehealth: Payer: Self-pay | Admitting: Family

## 2020-12-24 ENCOUNTER — Telehealth: Payer: Self-pay

## 2020-12-24 DIAGNOSIS — M5136 Other intervertebral disc degeneration, lumbar region: Secondary | ICD-10-CM | POA: Diagnosis not present

## 2020-12-24 DIAGNOSIS — I739 Peripheral vascular disease, unspecified: Secondary | ICD-10-CM | POA: Diagnosis not present

## 2020-12-24 DIAGNOSIS — I5022 Chronic systolic (congestive) heart failure: Secondary | ICD-10-CM | POA: Diagnosis not present

## 2020-12-24 DIAGNOSIS — I050 Rheumatic mitral stenosis: Secondary | ICD-10-CM | POA: Diagnosis not present

## 2020-12-24 DIAGNOSIS — I110 Hypertensive heart disease with heart failure: Secondary | ICD-10-CM | POA: Diagnosis not present

## 2020-12-24 DIAGNOSIS — I4821 Permanent atrial fibrillation: Secondary | ICD-10-CM | POA: Diagnosis not present

## 2020-12-24 NOTE — Telephone Encounter (Signed)
Katie, [place] called and stated pt was discharged from [facility] yesterday. Instructed for pt to have INR checked on Monday, 12/28/20. Verbalized understanding.

## 2020-12-24 NOTE — Telephone Encounter (Signed)
Home health called wondering about dressing change for pt. Is it daily or every other day? PT had a slow bloody ooze from incision. She drew blood and would like to fax result.  CB [phone]

## 2020-12-25 NOTE — Telephone Encounter (Signed)
Verbal order given  

## 2020-12-26 DIAGNOSIS — I110 Hypertensive heart disease with heart failure: Secondary | ICD-10-CM | POA: Diagnosis not present

## 2020-12-26 DIAGNOSIS — I5022 Chronic systolic (congestive) heart failure: Secondary | ICD-10-CM | POA: Diagnosis not present

## 2020-12-26 DIAGNOSIS — I050 Rheumatic mitral stenosis: Secondary | ICD-10-CM | POA: Diagnosis not present

## 2020-12-26 DIAGNOSIS — I739 Peripheral vascular disease, unspecified: Secondary | ICD-10-CM | POA: Diagnosis not present

## 2020-12-26 DIAGNOSIS — I4821 Permanent atrial fibrillation: Secondary | ICD-10-CM | POA: Diagnosis not present

## 2020-12-26 DIAGNOSIS — M5136 Other intervertebral disc degeneration, lumbar region: Secondary | ICD-10-CM | POA: Diagnosis not present

## 2020-12-28 ENCOUNTER — Ambulatory Visit (INDEPENDENT_AMBULATORY_CARE_PROVIDER_SITE_OTHER): Payer: Medicare Other

## 2020-12-28 DIAGNOSIS — I5022 Chronic systolic (congestive) heart failure: Secondary | ICD-10-CM | POA: Diagnosis not present

## 2020-12-28 DIAGNOSIS — I739 Peripheral vascular disease, unspecified: Secondary | ICD-10-CM | POA: Diagnosis not present

## 2020-12-28 DIAGNOSIS — I4821 Permanent atrial fibrillation: Secondary | ICD-10-CM | POA: Diagnosis not present

## 2020-12-28 DIAGNOSIS — M5136 Other intervertebral disc degeneration, lumbar region: Secondary | ICD-10-CM | POA: Diagnosis not present

## 2020-12-28 DIAGNOSIS — I110 Hypertensive heart disease with heart failure: Secondary | ICD-10-CM | POA: Diagnosis not present

## 2020-12-28 DIAGNOSIS — Z5181 Encounter for therapeutic drug level monitoring: Secondary | ICD-10-CM

## 2020-12-28 DIAGNOSIS — I050 Rheumatic mitral stenosis: Secondary | ICD-10-CM | POA: Diagnosis not present

## 2020-12-28 LAB — POCT INR: INR: 3.7 — AB (ref 2.0–3.0)

## 2020-12-28 NOTE — Patient Instructions (Signed)
Description   Spoke with Stanton Kidney RN with [facility] instructed for pt to only take 0.5 tablet today and then continue taking Warfarin 1.5 tablets daily except for 2 tablets on Wednesdays. Recheck INR in 1 week. Coumadin Clinic # [phone]

## 2020-12-29 DIAGNOSIS — I4821 Permanent atrial fibrillation: Secondary | ICD-10-CM | POA: Diagnosis not present

## 2020-12-29 DIAGNOSIS — M5136 Other intervertebral disc degeneration, lumbar region: Secondary | ICD-10-CM | POA: Diagnosis not present

## 2020-12-29 DIAGNOSIS — I739 Peripheral vascular disease, unspecified: Secondary | ICD-10-CM | POA: Diagnosis not present

## 2020-12-29 DIAGNOSIS — I110 Hypertensive heart disease with heart failure: Secondary | ICD-10-CM | POA: Diagnosis not present

## 2020-12-29 DIAGNOSIS — I050 Rheumatic mitral stenosis: Secondary | ICD-10-CM | POA: Diagnosis not present

## 2020-12-29 DIAGNOSIS — I5022 Chronic systolic (congestive) heart failure: Secondary | ICD-10-CM | POA: Diagnosis not present

## 2020-12-30 DIAGNOSIS — I5022 Chronic systolic (congestive) heart failure: Secondary | ICD-10-CM | POA: Diagnosis not present

## 2020-12-30 DIAGNOSIS — I4821 Permanent atrial fibrillation: Secondary | ICD-10-CM | POA: Diagnosis not present

## 2020-12-30 DIAGNOSIS — I739 Peripheral vascular disease, unspecified: Secondary | ICD-10-CM | POA: Diagnosis not present

## 2020-12-30 DIAGNOSIS — I110 Hypertensive heart disease with heart failure: Secondary | ICD-10-CM | POA: Diagnosis not present

## 2020-12-30 DIAGNOSIS — I050 Rheumatic mitral stenosis: Secondary | ICD-10-CM | POA: Diagnosis not present

## 2020-12-30 DIAGNOSIS — M5136 Other intervertebral disc degeneration, lumbar region: Secondary | ICD-10-CM | POA: Diagnosis not present

## 2021-01-01 ENCOUNTER — Encounter: Payer: Self-pay | Admitting: Family

## 2021-01-01 ENCOUNTER — Ambulatory Visit (INDEPENDENT_AMBULATORY_CARE_PROVIDER_SITE_OTHER): Payer: Medicare Other | Admitting: Family

## 2021-01-01 DIAGNOSIS — Z9889 Other specified postprocedural states: Secondary | ICD-10-CM

## 2021-01-01 DIAGNOSIS — Z86018 Personal history of other benign neoplasm: Secondary | ICD-10-CM

## 2021-01-01 NOTE — Progress Notes (Signed)
Post-Op Visit Note   Patient: [name]           Date of Birth: [birth year]           MRN: [id number] Visit Date: 01/01/2021 PCP: Josetta Huddle, MD  Chief Complaint: No chief complaint on file.   HPI:  HPI The patient is a 75 year old woman who presents today status post excision of lipoma left thigh.  She did unfortunately have some complications with bleeding and was hospitalized for symptomatic anemia after the surgery.  Her sutures remain in place they have been doing dry dressings with Ace wrap to the thigh  Ortho Exam On examination of the left thigh her incision appears to be healing well there are 2 remaining open areas where she does have moderate bloody drainage there is no surrounding erythema no purulence no odor no warmth  Visit Diagnoses: No diagnosis found.  Plan: Harvested some of her sutures today plan to harvest remaining sutures in 1 week they will continue with daily Dial soap cleansing and compression dressings with gauze and Ace wrap  Follow-Up Instructions: No follow-ups on file.   Imaging: No results found.  Orders:  No orders of the defined types were placed in this encounter.  No orders of the defined types were placed in this encounter.    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, left    Acute on chronic systolic HF (heart failure) ([place]) 09/28/2020   Coccyx pain    CHF (congestive heart failure) ([place]) 09/04/2020   Acute respiratory failure with hypoxia ([place]) 09/04/2020   Fall at home, initial encounter 09/04/2020   Mitral valve stenosis    Essential hypertension 06/27/2017   History of lupus anticoagulant disorder 03/31/2017   NICM (nonischemic cardiomyopathy) ([place]) 03/31/2017   Streptococcal bacteremia    History  of MRSA infection    Wound infection 03/24/2017   Cellulitis of right lower extremity 03/24/2017   Sepsis due to cellulitis ([place]) 03/04/2017   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 06/2008 Echo: EF 55-60%, mild LVH, triv AI, mild to mod MS, mod to sev dil LA, mildly dil RA.   Acute right MCA stroke ([place])    a. 0/11/3265 Embolic stroke treated with TPA and thrombectomy with hemorrhagic transformation; Carotids 3/12 negative for ICA stenosis.   Arthritis    Bilateral Pulmonary Emboli    a. 08/2005 - s/p IVC filter.   CHF (congestive heart failure) (HCC)    Depression    Embolus of femoral artery ([place])    a. 06/2008 s/p bilateral embolectomies.   History of DVT (deep vein thrombosis)    a. s/p IVC filter.   HIT (heparin-induced thrombocytopenia)    HTN (hypertension)    Lupus anticoagulant disorder (HCC)    Mitral stenosis    moderate-severe 09/04/20 echo   Obesity, unspecified    Permanent atrial fibrillation ([place])  a. On chronic Coumadin - INRs followed by PCP; b. CHA2DS2VASc = 5.   Popliteal artery embolism, right ([place])    a. 01/2017 in the setting of subRx INR-->s/p embolectomy.    Family History  Problem Relation Age of Onset   Coronary artery disease Other    Diabetes Other     Past Surgical History:  Procedure Laterality Date   APPLICATION OF WOUND VAC  12/09/2020   Procedure: APPLICATION OF WOUND VAC;  Surgeon: [name], MD;  Location: [facility];  Service: Orthopedics;;   distal radius fracture. (12,/16/2010)     EMBOLECTOMY Right 02/15/2017   Procedure: EMBOLECTOMY  RIGHT LEG;  Surgeon: Elam Dutch, MD;  Location: [facility];  Service: Vascular;  Laterality: Right;   EYE SURGERY Bilateral 2021   cataracts removed   I & D EXTREMITY Right 03/05/2017   Procedure: IRRIGATION AND DEBRIDEMENT RIGHT LEG HEMATOMA EVACUATION;  Surgeon: Elam Dutch, MD;  Location: [facility];  Service: Vascular;  Laterality: Right;   IVC Placement 08/30/2005     LIPOMA EXCISION Left 12/09/2020   Procedure: EXCISION LIPOMA LEFT THIGH;  Surgeon: [name], MD;  Location: [facility];  Service: Orthopedics;  Laterality: Left;   Open reduction and internal fixation of left intra-articular     Social History   Occupational History   Not on file  Tobacco Use   Smoking status: Never   Smokeless tobacco: Never  Vaping Use   Vaping Use: Never used  Substance and Sexual Activity   Alcohol use: No   Drug use: No   Sexual activity: Not Currently

## 2021-01-02 DIAGNOSIS — Z86718 Personal history of other venous thrombosis and embolism: Secondary | ICD-10-CM | POA: Diagnosis not present

## 2021-01-02 DIAGNOSIS — I5022 Chronic systolic (congestive) heart failure: Secondary | ICD-10-CM | POA: Diagnosis not present

## 2021-01-02 DIAGNOSIS — Z4801 Encounter for change or removal of surgical wound dressing: Secondary | ICD-10-CM | POA: Diagnosis not present

## 2021-01-02 DIAGNOSIS — D6862 Lupus anticoagulant syndrome: Secondary | ICD-10-CM | POA: Diagnosis not present

## 2021-01-02 DIAGNOSIS — E669 Obesity, unspecified: Secondary | ICD-10-CM | POA: Diagnosis not present

## 2021-01-02 DIAGNOSIS — Z7982 Long term (current) use of aspirin: Secondary | ICD-10-CM | POA: Diagnosis not present

## 2021-01-02 DIAGNOSIS — Z4889 Encounter for other specified surgical aftercare: Secondary | ICD-10-CM | POA: Diagnosis not present

## 2021-01-02 DIAGNOSIS — Z8673 Personal history of transient ischemic attack (TIA), and cerebral infarction without residual deficits: Secondary | ICD-10-CM | POA: Diagnosis not present

## 2021-01-02 DIAGNOSIS — I739 Peripheral vascular disease, unspecified: Secondary | ICD-10-CM | POA: Diagnosis not present

## 2021-01-02 DIAGNOSIS — I050 Rheumatic mitral stenosis: Secondary | ICD-10-CM | POA: Diagnosis not present

## 2021-01-02 DIAGNOSIS — I4821 Permanent atrial fibrillation: Secondary | ICD-10-CM | POA: Diagnosis not present

## 2021-01-02 DIAGNOSIS — Z86711 Personal history of pulmonary embolism: Secondary | ICD-10-CM | POA: Diagnosis not present

## 2021-01-02 DIAGNOSIS — Z7901 Long term (current) use of anticoagulants: Secondary | ICD-10-CM | POA: Diagnosis not present

## 2021-01-02 DIAGNOSIS — Z9181 History of falling: Secondary | ICD-10-CM | POA: Diagnosis not present

## 2021-01-02 DIAGNOSIS — M5136 Other intervertebral disc degeneration, lumbar region: Secondary | ICD-10-CM | POA: Diagnosis not present

## 2021-01-02 DIAGNOSIS — I110 Hypertensive heart disease with heart failure: Secondary | ICD-10-CM | POA: Diagnosis not present

## 2021-01-02 DIAGNOSIS — Z6839 Body mass index (BMI) 39.0-39.9, adult: Secondary | ICD-10-CM | POA: Diagnosis not present

## 2021-01-04 ENCOUNTER — Telehealth: Payer: Self-pay | Admitting: Cardiology

## 2021-01-04 ENCOUNTER — Ambulatory Visit (INDEPENDENT_AMBULATORY_CARE_PROVIDER_SITE_OTHER): Payer: Medicare Other

## 2021-01-04 DIAGNOSIS — I5022 Chronic systolic (congestive) heart failure: Secondary | ICD-10-CM | POA: Diagnosis not present

## 2021-01-04 DIAGNOSIS — Z5181 Encounter for therapeutic drug level monitoring: Secondary | ICD-10-CM

## 2021-01-04 DIAGNOSIS — Z86711 Personal history of pulmonary embolism: Secondary | ICD-10-CM | POA: Diagnosis not present

## 2021-01-04 DIAGNOSIS — I4821 Permanent atrial fibrillation: Secondary | ICD-10-CM | POA: Diagnosis not present

## 2021-01-04 DIAGNOSIS — I110 Hypertensive heart disease with heart failure: Secondary | ICD-10-CM | POA: Diagnosis not present

## 2021-01-04 DIAGNOSIS — Z9889 Other specified postprocedural states: Secondary | ICD-10-CM | POA: Diagnosis not present

## 2021-01-04 DIAGNOSIS — T148XXA Other injury of unspecified body region, initial encounter: Secondary | ICD-10-CM | POA: Diagnosis not present

## 2021-01-04 DIAGNOSIS — Z86718 Personal history of other venous thrombosis and embolism: Secondary | ICD-10-CM | POA: Diagnosis not present

## 2021-01-04 DIAGNOSIS — Z4801 Encounter for change or removal of surgical wound dressing: Secondary | ICD-10-CM | POA: Diagnosis not present

## 2021-01-04 DIAGNOSIS — I050 Rheumatic mitral stenosis: Secondary | ICD-10-CM | POA: Diagnosis not present

## 2021-01-04 DIAGNOSIS — D6859 Other primary thrombophilia: Secondary | ICD-10-CM | POA: Diagnosis not present

## 2021-01-04 DIAGNOSIS — M5136 Other intervertebral disc degeneration, lumbar region: Secondary | ICD-10-CM | POA: Diagnosis not present

## 2021-01-04 DIAGNOSIS — R2242 Localized swelling, mass and lump, left lower limb: Secondary | ICD-10-CM | POA: Diagnosis not present

## 2021-01-04 LAB — POCT INR: INR: 2.9 (ref 2.0–3.0)

## 2021-01-04 NOTE — Patient Instructions (Signed)
Spoke with Clarise Cruz, RN with [facility] instructed for pt continue taking Warfarin 1.5 tablets daily except for 2 tablets on Wednesdays. Recheck INR in 1 week. Coumadin Clinic # [phone]

## 2021-01-04 NOTE — Telephone Encounter (Signed)
Clarise Cruz from [facility] is calling to report this patients INR

## 2021-01-04 NOTE — Telephone Encounter (Signed)
Please see anti-coag note for today. 

## 2021-01-05 DIAGNOSIS — I5022 Chronic systolic (congestive) heart failure: Secondary | ICD-10-CM | POA: Diagnosis not present

## 2021-01-05 DIAGNOSIS — I050 Rheumatic mitral stenosis: Secondary | ICD-10-CM | POA: Diagnosis not present

## 2021-01-05 DIAGNOSIS — I4821 Permanent atrial fibrillation: Secondary | ICD-10-CM | POA: Diagnosis not present

## 2021-01-05 DIAGNOSIS — Z4801 Encounter for change or removal of surgical wound dressing: Secondary | ICD-10-CM | POA: Diagnosis not present

## 2021-01-05 DIAGNOSIS — M5136 Other intervertebral disc degeneration, lumbar region: Secondary | ICD-10-CM | POA: Diagnosis not present

## 2021-01-05 DIAGNOSIS — I110 Hypertensive heart disease with heart failure: Secondary | ICD-10-CM | POA: Diagnosis not present

## 2021-01-08 ENCOUNTER — Other Ambulatory Visit: Payer: Self-pay

## 2021-01-08 ENCOUNTER — Encounter: Payer: Self-pay | Admitting: Family

## 2021-01-08 ENCOUNTER — Ambulatory Visit (INDEPENDENT_AMBULATORY_CARE_PROVIDER_SITE_OTHER): Payer: Medicare Other | Admitting: Family

## 2021-01-08 DIAGNOSIS — T148XXA Other injury of unspecified body region, initial encounter: Secondary | ICD-10-CM

## 2021-01-08 NOTE — Progress Notes (Signed)
Post-Op Visit Note   Patient: [name]           Date of Birth: 07-Mar-1946           MRN: [id number] Visit Date: 01/08/2021 PCP: Josetta Huddle, MD  Chief Complaint:  Chief Complaint  Patient presents with   Left Leg - Routine Post Op    HPI:  HPI Patient is a 75 year old woman seen postoperatively status post I&D of her left hip.  Remaining sutures to be harvested today she is not having any new concerns or pain she does continue having some bloody drainage from her incision and 3 open areas  Ortho Exam Incision is healing well overall there are 3 remaining open areas these are 5 mm in length.  There is no active drainage on exam no surrounding erythema odor no sign of infection compartments are soft.  No sign of hematoma.  Visit Diagnoses: No diagnosis found.  Plan: Continue daily Dial soap cleansing.  Dry dressing changes.  Remaining sutures harvested today without incident.  Follow-Up Instructions: Return in about 2 weeks (around 01/22/2021).   Imaging: No results found.  Orders:  No orders of the defined types were placed in this encounter.  No orders of the defined types were placed in this encounter.    PMFS History: Patient Active Problem List   Diagnosis Date Noted   Acute blood loss anemia 12/15/2020   Lupus anticoagulant syndrome ([place]) 12/15/2020   Bleeding from wound 12/14/2020   Symptomatic anemia 12/14/2020   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, left    Acute on chronic systolic HF (heart failure) ([place]) 09/28/2020   Coccyx pain    CHF (congestive heart failure) ([place]) 09/04/2020   Acute respiratory failure with hypoxia ([place]) 09/04/2020   Fall at home, initial encounter 09/04/2020   Mitral valve stenosis    Essential hypertension 06/27/2017   History of lupus anticoagulant disorder 03/31/2017   NICM (nonischemic cardiomyopathy) ([place]) 03/31/2017   Streptococcal bacteremia    History of MRSA infection    Wound infection 03/24/2017    Cellulitis of right lower extremity 03/24/2017   Sepsis due to cellulitis ([place]) 03/04/2017   Popliteal artery embolus ([place]) 02/16/2017   Encounter for therapeutic drug monitoring 06/11/2013   History of CVA (cerebrovascular accident) 11/18/2010   Long term (current) use of anticoagulants 08/09/2010   Obesity, unspecified 08/14/2008   Permanent atrial fibrillation ([place]) 03/07/2008   History of pulmonary embolism 08/30/2005   Past Medical History:  Diagnosis Date   (HFpEF) heart failure with preserved ejection fraction ([place])    a. 06/2008 Echo: EF 55-60%, mild LVH, triv AI, mild to mod MS, mod to sev dil LA, mildly dil RA.   Acute right MCA stroke ([place])    a. 07/29/6642 Embolic stroke treated with TPA and thrombectomy with hemorrhagic transformation; Carotids 3/12 negative for ICA stenosis.   Arthritis    Bilateral Pulmonary Emboli    a. 08/2005 - s/p IVC filter.   CHF (congestive heart failure) (HCC)    Depression    Embolus of femoral artery ([place])    a. 06/2008 s/p bilateral embolectomies.   History of DVT (deep vein thrombosis)    a. s/p IVC filter.   HIT (heparin-induced thrombocytopenia)    HTN (hypertension)    Lupus anticoagulant disorder (HCC)    Mitral stenosis    moderate-severe 09/04/20 echo   Obesity, unspecified    Permanent atrial fibrillation ([place])    a. On chronic Coumadin -  INRs followed by PCP; b. CHA2DS2VASc = 5.   Popliteal artery embolism, right ([place])    a. 01/2017 in the setting of subRx INR-->s/p embolectomy.    Family History  Problem Relation Age of Onset   Coronary artery disease Other    Diabetes Other     Past Surgical History:  Procedure Laterality Date   APPLICATION OF WOUND VAC  12/09/2020   Procedure: APPLICATION OF WOUND VAC;  Surgeon: [name], MD;  Location: [place];  Service: Orthopedics;;   distal radius fracture. (12,/16/2010)     EMBOLECTOMY Right 02/15/2017   Procedure: EMBOLECTOMY RIGHT LEG;  Surgeon: Elam Dutch, MD;  Location:  [facility];  Service: Vascular;  Laterality: Right;   EYE SURGERY Bilateral 2021   cataracts removed   I & D EXTREMITY Right 03/05/2017   Procedure: IRRIGATION AND DEBRIDEMENT RIGHT LEG HEMATOMA EVACUATION;  Surgeon: Elam Dutch, MD;  Location: [facility];  Service: Vascular;  Laterality: Right;   IVC Placement 08/30/2005     LIPOMA EXCISION Left 12/09/2020   Procedure: EXCISION LIPOMA LEFT THIGH;  Surgeon: [name], MD;  Location: [facility];  Service: Orthopedics;  Laterality: Left;   Open reduction and internal fixation of left intra-articular     Social History   Occupational History   Not on file  Tobacco Use   Smoking status: Never   Smokeless tobacco: Never  Vaping Use   Vaping Use: Never used  Substance and Sexual Activity   Alcohol use: No   Drug use: No   Sexual activity: Not Currently

## 2021-01-11 ENCOUNTER — Ambulatory Visit (INDEPENDENT_AMBULATORY_CARE_PROVIDER_SITE_OTHER): Payer: Medicare Other

## 2021-01-11 DIAGNOSIS — Z5181 Encounter for therapeutic drug level monitoring: Secondary | ICD-10-CM | POA: Diagnosis not present

## 2021-01-11 DIAGNOSIS — I743 Embolism and thrombosis of arteries of the lower extremities: Secondary | ICD-10-CM | POA: Diagnosis not present

## 2021-01-11 DIAGNOSIS — Z86711 Personal history of pulmonary embolism: Secondary | ICD-10-CM

## 2021-01-11 DIAGNOSIS — I5022 Chronic systolic (congestive) heart failure: Secondary | ICD-10-CM | POA: Diagnosis not present

## 2021-01-11 DIAGNOSIS — M5136 Other intervertebral disc degeneration, lumbar region: Secondary | ICD-10-CM | POA: Diagnosis not present

## 2021-01-11 DIAGNOSIS — I050 Rheumatic mitral stenosis: Secondary | ICD-10-CM | POA: Diagnosis not present

## 2021-01-11 DIAGNOSIS — I4821 Permanent atrial fibrillation: Secondary | ICD-10-CM | POA: Diagnosis not present

## 2021-01-11 DIAGNOSIS — Z4801 Encounter for change or removal of surgical wound dressing: Secondary | ICD-10-CM | POA: Diagnosis not present

## 2021-01-11 DIAGNOSIS — I110 Hypertensive heart disease with heart failure: Secondary | ICD-10-CM | POA: Diagnosis not present

## 2021-01-11 LAB — POCT INR: INR: 3.3 — AB (ref 2.0–3.0)

## 2021-01-11 NOTE — Patient Instructions (Signed)
Spoke with Clarise Cruz, RN with [facility] instructed for pt continue taking Warfarin 1.5 tablets daily except for 2 tablets on Wednesdays. Recheck INR in 1 week. Coumadin Clinic # [phone]

## 2021-01-12 DIAGNOSIS — M5136 Other intervertebral disc degeneration, lumbar region: Secondary | ICD-10-CM | POA: Diagnosis not present

## 2021-01-12 DIAGNOSIS — I050 Rheumatic mitral stenosis: Secondary | ICD-10-CM | POA: Diagnosis not present

## 2021-01-12 DIAGNOSIS — Z4801 Encounter for change or removal of surgical wound dressing: Secondary | ICD-10-CM | POA: Diagnosis not present

## 2021-01-12 DIAGNOSIS — I5022 Chronic systolic (congestive) heart failure: Secondary | ICD-10-CM | POA: Diagnosis not present

## 2021-01-12 DIAGNOSIS — I4821 Permanent atrial fibrillation: Secondary | ICD-10-CM | POA: Diagnosis not present

## 2021-01-12 DIAGNOSIS — I110 Hypertensive heart disease with heart failure: Secondary | ICD-10-CM | POA: Diagnosis not present

## 2021-01-14 DIAGNOSIS — I050 Rheumatic mitral stenosis: Secondary | ICD-10-CM | POA: Diagnosis not present

## 2021-01-14 DIAGNOSIS — I110 Hypertensive heart disease with heart failure: Secondary | ICD-10-CM | POA: Diagnosis not present

## 2021-01-14 DIAGNOSIS — I5022 Chronic systolic (congestive) heart failure: Secondary | ICD-10-CM | POA: Diagnosis not present

## 2021-01-14 DIAGNOSIS — Z4801 Encounter for change or removal of surgical wound dressing: Secondary | ICD-10-CM | POA: Diagnosis not present

## 2021-01-14 DIAGNOSIS — M5136 Other intervertebral disc degeneration, lumbar region: Secondary | ICD-10-CM | POA: Diagnosis not present

## 2021-01-14 DIAGNOSIS — I4821 Permanent atrial fibrillation: Secondary | ICD-10-CM | POA: Diagnosis not present

## 2021-01-18 ENCOUNTER — Ambulatory Visit (INDEPENDENT_AMBULATORY_CARE_PROVIDER_SITE_OTHER): Payer: Medicare Other

## 2021-01-18 DIAGNOSIS — I743 Embolism and thrombosis of arteries of the lower extremities: Secondary | ICD-10-CM

## 2021-01-18 DIAGNOSIS — Z5181 Encounter for therapeutic drug level monitoring: Secondary | ICD-10-CM | POA: Diagnosis not present

## 2021-01-18 DIAGNOSIS — Z86711 Personal history of pulmonary embolism: Secondary | ICD-10-CM | POA: Diagnosis not present

## 2021-01-18 DIAGNOSIS — M5136 Other intervertebral disc degeneration, lumbar region: Secondary | ICD-10-CM | POA: Diagnosis not present

## 2021-01-18 DIAGNOSIS — Z4801 Encounter for change or removal of surgical wound dressing: Secondary | ICD-10-CM | POA: Diagnosis not present

## 2021-01-18 DIAGNOSIS — I110 Hypertensive heart disease with heart failure: Secondary | ICD-10-CM | POA: Diagnosis not present

## 2021-01-18 DIAGNOSIS — I4821 Permanent atrial fibrillation: Secondary | ICD-10-CM | POA: Diagnosis not present

## 2021-01-18 DIAGNOSIS — I5022 Chronic systolic (congestive) heart failure: Secondary | ICD-10-CM | POA: Diagnosis not present

## 2021-01-18 DIAGNOSIS — I050 Rheumatic mitral stenosis: Secondary | ICD-10-CM | POA: Diagnosis not present

## 2021-01-18 LAB — POCT INR: INR: 3.2 — AB (ref 2.0–3.0)

## 2021-01-18 NOTE — Patient Instructions (Signed)
Description   Spoke with Clarise Cruz, RN with [facility] instructed for pt continue taking Warfarin 1.5 tablets daily except for 2 tablets on Wednesdays. Recheck INR in 1 week. Coumadin Clinic # [phone]

## 2021-01-19 DIAGNOSIS — Z4801 Encounter for change or removal of surgical wound dressing: Secondary | ICD-10-CM | POA: Diagnosis not present

## 2021-01-19 DIAGNOSIS — I5022 Chronic systolic (congestive) heart failure: Secondary | ICD-10-CM | POA: Diagnosis not present

## 2021-01-19 DIAGNOSIS — I110 Hypertensive heart disease with heart failure: Secondary | ICD-10-CM | POA: Diagnosis not present

## 2021-01-19 DIAGNOSIS — M5136 Other intervertebral disc degeneration, lumbar region: Secondary | ICD-10-CM | POA: Diagnosis not present

## 2021-01-19 DIAGNOSIS — I4821 Permanent atrial fibrillation: Secondary | ICD-10-CM | POA: Diagnosis not present

## 2021-01-19 DIAGNOSIS — I050 Rheumatic mitral stenosis: Secondary | ICD-10-CM | POA: Diagnosis not present

## 2021-01-21 ENCOUNTER — Ambulatory Visit (INDEPENDENT_AMBULATORY_CARE_PROVIDER_SITE_OTHER): Payer: Medicare Other | Admitting: Orthopedic Surgery

## 2021-01-21 DIAGNOSIS — M7989 Other specified soft tissue disorders: Secondary | ICD-10-CM

## 2021-01-22 DIAGNOSIS — I5022 Chronic systolic (congestive) heart failure: Secondary | ICD-10-CM | POA: Diagnosis not present

## 2021-01-22 DIAGNOSIS — M5136 Other intervertebral disc degeneration, lumbar region: Secondary | ICD-10-CM | POA: Diagnosis not present

## 2021-01-22 DIAGNOSIS — I4821 Permanent atrial fibrillation: Secondary | ICD-10-CM | POA: Diagnosis not present

## 2021-01-22 DIAGNOSIS — I110 Hypertensive heart disease with heart failure: Secondary | ICD-10-CM | POA: Diagnosis not present

## 2021-01-22 DIAGNOSIS — Z4801 Encounter for change or removal of surgical wound dressing: Secondary | ICD-10-CM | POA: Diagnosis not present

## 2021-01-22 DIAGNOSIS — I050 Rheumatic mitral stenosis: Secondary | ICD-10-CM | POA: Diagnosis not present

## 2021-01-25 DIAGNOSIS — I110 Hypertensive heart disease with heart failure: Secondary | ICD-10-CM | POA: Diagnosis not present

## 2021-01-25 DIAGNOSIS — M5136 Other intervertebral disc degeneration, lumbar region: Secondary | ICD-10-CM | POA: Diagnosis not present

## 2021-01-25 DIAGNOSIS — Z4801 Encounter for change or removal of surgical wound dressing: Secondary | ICD-10-CM | POA: Diagnosis not present

## 2021-01-25 DIAGNOSIS — I5022 Chronic systolic (congestive) heart failure: Secondary | ICD-10-CM | POA: Diagnosis not present

## 2021-01-25 DIAGNOSIS — I050 Rheumatic mitral stenosis: Secondary | ICD-10-CM | POA: Diagnosis not present

## 2021-01-25 DIAGNOSIS — I4821 Permanent atrial fibrillation: Secondary | ICD-10-CM | POA: Diagnosis not present

## 2021-01-26 DIAGNOSIS — I050 Rheumatic mitral stenosis: Secondary | ICD-10-CM | POA: Diagnosis not present

## 2021-01-26 DIAGNOSIS — Z4801 Encounter for change or removal of surgical wound dressing: Secondary | ICD-10-CM | POA: Diagnosis not present

## 2021-01-26 DIAGNOSIS — I110 Hypertensive heart disease with heart failure: Secondary | ICD-10-CM | POA: Diagnosis not present

## 2021-01-26 DIAGNOSIS — I5022 Chronic systolic (congestive) heart failure: Secondary | ICD-10-CM | POA: Diagnosis not present

## 2021-01-26 DIAGNOSIS — M5136 Other intervertebral disc degeneration, lumbar region: Secondary | ICD-10-CM | POA: Diagnosis not present

## 2021-01-26 DIAGNOSIS — I4821 Permanent atrial fibrillation: Secondary | ICD-10-CM | POA: Diagnosis not present

## 2021-01-27 ENCOUNTER — Ambulatory Visit (INDEPENDENT_AMBULATORY_CARE_PROVIDER_SITE_OTHER): Payer: Medicare Other

## 2021-01-27 ENCOUNTER — Telehealth: Payer: Self-pay | Admitting: Cardiology

## 2021-01-27 DIAGNOSIS — M5136 Other intervertebral disc degeneration, lumbar region: Secondary | ICD-10-CM | POA: Diagnosis not present

## 2021-01-27 DIAGNOSIS — Z86711 Personal history of pulmonary embolism: Secondary | ICD-10-CM

## 2021-01-27 DIAGNOSIS — I110 Hypertensive heart disease with heart failure: Secondary | ICD-10-CM | POA: Diagnosis not present

## 2021-01-27 DIAGNOSIS — I4821 Permanent atrial fibrillation: Secondary | ICD-10-CM | POA: Diagnosis not present

## 2021-01-27 DIAGNOSIS — I743 Embolism and thrombosis of arteries of the lower extremities: Secondary | ICD-10-CM | POA: Diagnosis not present

## 2021-01-27 DIAGNOSIS — Z5181 Encounter for therapeutic drug level monitoring: Secondary | ICD-10-CM | POA: Diagnosis not present

## 2021-01-27 DIAGNOSIS — I5022 Chronic systolic (congestive) heart failure: Secondary | ICD-10-CM | POA: Diagnosis not present

## 2021-01-27 DIAGNOSIS — I050 Rheumatic mitral stenosis: Secondary | ICD-10-CM | POA: Diagnosis not present

## 2021-01-27 DIAGNOSIS — Z4801 Encounter for change or removal of surgical wound dressing: Secondary | ICD-10-CM | POA: Diagnosis not present

## 2021-01-27 LAB — POCT INR: INR: 2.4 (ref 2.0–3.0)

## 2021-01-27 NOTE — Patient Instructions (Signed)
Description   Spoke with Sharyn Lull, RN with [facility] while in pt's home, advised to have pt take 2.5 tablets today, then resume same dosage of Warfarin 1.5 tablets daily except for 2 tablets on Wednesdays. Recheck INR in 1 week. Coumadin Clinic # [phone]

## 2021-01-27 NOTE — Telephone Encounter (Signed)
I did not need this encounter. °

## 2021-01-29 DIAGNOSIS — Z4801 Encounter for change or removal of surgical wound dressing: Secondary | ICD-10-CM | POA: Diagnosis not present

## 2021-01-29 DIAGNOSIS — M5136 Other intervertebral disc degeneration, lumbar region: Secondary | ICD-10-CM | POA: Diagnosis not present

## 2021-01-29 DIAGNOSIS — I050 Rheumatic mitral stenosis: Secondary | ICD-10-CM | POA: Diagnosis not present

## 2021-01-29 DIAGNOSIS — I5022 Chronic systolic (congestive) heart failure: Secondary | ICD-10-CM | POA: Diagnosis not present

## 2021-01-29 DIAGNOSIS — I4821 Permanent atrial fibrillation: Secondary | ICD-10-CM | POA: Diagnosis not present

## 2021-01-29 DIAGNOSIS — I110 Hypertensive heart disease with heart failure: Secondary | ICD-10-CM | POA: Diagnosis not present

## 2021-02-01 ENCOUNTER — Ambulatory Visit (INDEPENDENT_AMBULATORY_CARE_PROVIDER_SITE_OTHER): Payer: Medicare Other

## 2021-02-01 DIAGNOSIS — I739 Peripheral vascular disease, unspecified: Secondary | ICD-10-CM | POA: Diagnosis not present

## 2021-02-01 DIAGNOSIS — I743 Embolism and thrombosis of arteries of the lower extremities: Secondary | ICD-10-CM | POA: Diagnosis not present

## 2021-02-01 DIAGNOSIS — Z9181 History of falling: Secondary | ICD-10-CM | POA: Diagnosis not present

## 2021-02-01 DIAGNOSIS — Z86711 Personal history of pulmonary embolism: Secondary | ICD-10-CM | POA: Diagnosis not present

## 2021-02-01 DIAGNOSIS — I110 Hypertensive heart disease with heart failure: Secondary | ICD-10-CM | POA: Diagnosis not present

## 2021-02-01 DIAGNOSIS — E669 Obesity, unspecified: Secondary | ICD-10-CM | POA: Diagnosis not present

## 2021-02-01 DIAGNOSIS — I4821 Permanent atrial fibrillation: Secondary | ICD-10-CM | POA: Diagnosis not present

## 2021-02-01 DIAGNOSIS — D6862 Lupus anticoagulant syndrome: Secondary | ICD-10-CM | POA: Diagnosis not present

## 2021-02-01 DIAGNOSIS — Z7982 Long term (current) use of aspirin: Secondary | ICD-10-CM | POA: Diagnosis not present

## 2021-02-01 DIAGNOSIS — I050 Rheumatic mitral stenosis: Secondary | ICD-10-CM | POA: Diagnosis not present

## 2021-02-01 DIAGNOSIS — I5022 Chronic systolic (congestive) heart failure: Secondary | ICD-10-CM | POA: Diagnosis not present

## 2021-02-01 DIAGNOSIS — Z5181 Encounter for therapeutic drug level monitoring: Secondary | ICD-10-CM

## 2021-02-01 DIAGNOSIS — Z4801 Encounter for change or removal of surgical wound dressing: Secondary | ICD-10-CM | POA: Diagnosis not present

## 2021-02-01 DIAGNOSIS — M5136 Other intervertebral disc degeneration, lumbar region: Secondary | ICD-10-CM | POA: Diagnosis not present

## 2021-02-01 DIAGNOSIS — Z7901 Long term (current) use of anticoagulants: Secondary | ICD-10-CM | POA: Diagnosis not present

## 2021-02-01 LAB — POCT INR: INR: 2.3 (ref 2.0–3.0)

## 2021-02-01 NOTE — Patient Instructions (Signed)
Spoke with Sharyn Lull, RN with [facility] while in pt's home, advised to have pt start taking new dosage of Warfarin 1.5 tablets daily except for 2 tablets on Mondays &  Wednesdays. Recheck INR in 1 week. Coumadin Clinic # [phone]

## 2021-02-02 ENCOUNTER — Encounter: Payer: Self-pay | Admitting: Orthopedic Surgery

## 2021-02-02 DIAGNOSIS — I110 Hypertensive heart disease with heart failure: Secondary | ICD-10-CM | POA: Diagnosis not present

## 2021-02-02 DIAGNOSIS — M5136 Other intervertebral disc degeneration, lumbar region: Secondary | ICD-10-CM | POA: Diagnosis not present

## 2021-02-02 DIAGNOSIS — I5022 Chronic systolic (congestive) heart failure: Secondary | ICD-10-CM | POA: Diagnosis not present

## 2021-02-02 DIAGNOSIS — I4821 Permanent atrial fibrillation: Secondary | ICD-10-CM | POA: Diagnosis not present

## 2021-02-02 DIAGNOSIS — I050 Rheumatic mitral stenosis: Secondary | ICD-10-CM | POA: Diagnosis not present

## 2021-02-02 DIAGNOSIS — I739 Peripheral vascular disease, unspecified: Secondary | ICD-10-CM | POA: Diagnosis not present

## 2021-02-02 NOTE — Progress Notes (Signed)
Office Visit Note   Patient: [name]           Date of Birth: 11/05/1945           MRN: [id number] Visit Date: 01/21/2021              Requested by: Josetta Huddle, MD [address] [place],  [place] [address] PCP: Josetta Huddle, MD  Chief Complaint  Patient presents with   Left Hip - Routine Post Op    12/09/20 I&D left hip       HPI: Patient is status post excision of a traumatized lipoma left thigh.  Assessment & Plan: Visit Diagnoses:  1. Mass of soft tissue of thigh     Plan: Follow-up as needed continue with dry dressing as needed.  Pathology report consistent with traumatized lipoma.  Follow-Up Instructions: Return if symptoms worsen or fail to improve.   Ortho Exam  Patient is alert, oriented, no adenopathy, well-dressed, normal affect, normal respiratory effort. Examination the incision is well-healed there is no drainage no redness.  Pathology report was consistent with traumatized lipoma.  Imaging: No results found. No images are attached to the encounter.  Labs: Lab Results  Component Value Date   HGBA1C 6.0 (H) 09/04/2020   HGBA1C (H) 06/04/2010    6.3 (NOTE)                                                                       According to the ADA Clinical Practice Recommendations for 2011, when HbA1c is used as a screening test:   >=6.5%   Diagnostic of Diabetes Mellitus           (if abnormal result  is confirmed)  5.7-6.4%   Increased risk of developing Diabetes Mellitus  References:Diagnosis and Classification of Diabetes Mellitus,Diabetes XBJY,7829,56(OZHYQ 1):S62-S69 and Standards of Medical Care in         Diabetes - 2011,Diabetes MVHQ,4696,29  (Suppl 1):S11-S61.   HGBA1C (H) 07/24/2008    6.3 (NOTE) The ADA recommends the following therapeutic goal for glycemic control related to Hgb A1c measurement: Goal of therapy: <6.5 Hgb A1c  Reference: American Diabetes Association: Clinical Practice Recommendations 2010, Diabetes  Care, 2010, 33: (Suppl  1).   ESRSEDRATE 49 (H) 03/23/2017   CRP 7.1 (H) 03/23/2017   REPTSTATUS 03/30/2017 FINAL 03/25/2017   GRAMSTAIN  03/05/2017    MODERATE WBC PRESENT, PREDOMINANTLY PMN RARE GRAM POSITIVE COCCI IN PAIRS    CULT NO GROWTH 5 DAYS 03/25/2017   LABORGA STREPTOCOCCUS GROUP G 03/23/2017     Lab Results  Component Value Date   ALBUMIN 2.7 (L) 12/23/2020   ALBUMIN 2.7 (L) 12/22/2020   ALBUMIN 2.5 (L) 12/19/2020    Lab Results  Component Value Date   MG 2.1 12/23/2020   MG 2.0 12/22/2020   MG 1.9 12/21/2020   No results found for: VD25OH  No results found for: PREALBUMIN CBC EXTENDED Latest Ref Rng & Units 12/23/2020 12/22/2020 12/21/2020  WBC 4.0 - 10.5 K/uL 8.4 10.6(H) -  RBC 3.87 - 5.11 MIL/uL 2.65(L) 2.75(L) -  HGB 12.0 - 15.0 g/dL 8.7(L) 9.0(L) 9.2(L)  HCT 36.0 - 46.0 % 27.6(L) 28.5(L) 28.7(L)  PLT 150 - 400 K/uL 321  321 -  NEUTROABS 1.7 - 7.7 K/uL 5.3 7.1 -  LYMPHSABS 0.7 - 4.0 K/uL 2.0 2.3 -     There is no height or weight on file to calculate BMI.  Orders:  No orders of the defined types were placed in this encounter.  No orders of the defined types were placed in this encounter.    Procedures: No procedures performed  Clinical Data: No additional findings.  ROS:  All other systems negative, except as noted in the HPI. Review of Systems  Objective: Vital Signs: There were no vitals taken for this visit.  Specialty Comments:  No specialty comments available.  PMFS History: Patient Active Problem List   Diagnosis Date Noted   Acute blood loss anemia 12/15/2020   Lupus anticoagulant syndrome ([place]) 12/15/2020   Bleeding from wound 12/14/2020   Symptomatic anemia 12/14/2020   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, left    Acute on chronic systolic HF (heart failure) ([place]) 09/28/2020   Coccyx pain    CHF (congestive heart failure) ([place]) 09/04/2020   Acute respiratory failure with hypoxia ([place]) 09/04/2020   Fall at home,  initial encounter 09/04/2020   Mitral valve stenosis    Essential hypertension 06/27/2017   History of lupus anticoagulant disorder 03/31/2017   NICM (nonischemic cardiomyopathy) ([place]) 03/31/2017   Streptococcal bacteremia    History of MRSA infection    Wound infection 03/24/2017   Cellulitis of right lower extremity 03/24/2017   Sepsis due to cellulitis ([place]) 03/04/2017   Popliteal artery embolus ([place]) 02/16/2017   Encounter for therapeutic drug monitoring 06/11/2013   History of CVA (cerebrovascular accident) 11/18/2010   Long term (current) use of anticoagulants 08/09/2010   Obesity, unspecified 08/14/2008   Permanent atrial fibrillation ([place]) 03/07/2008   History of pulmonary embolism 08/30/2005   Past Medical History:  Diagnosis Date   (HFpEF) heart failure with preserved ejection fraction ([place])    a. 06/2008 Echo: EF 55-60%, mild LVH, triv AI, mild to mod MS, mod to sev dil LA, mildly dil RA.   Acute right MCA stroke ([place])    a. 0/11/3816 Embolic stroke treated with TPA and thrombectomy with hemorrhagic transformation; Carotids 3/12 negative for ICA stenosis.   Arthritis    Bilateral Pulmonary Emboli    a. 08/2005 - s/p IVC filter.   CHF (congestive heart failure) (HCC)    Depression    Embolus of femoral artery ([place])    a. 06/2008 s/p bilateral embolectomies.   History of DVT (deep vein thrombosis)    a. s/p IVC filter.   HIT (heparin-induced thrombocytopenia)    HTN (hypertension)    Lupus anticoagulant disorder (HCC)    Mitral stenosis    moderate-severe 09/04/20 echo   Obesity, unspecified    Permanent atrial fibrillation ([employer])    a. On chronic Coumadin - INRs followed by PCP; b. CHA2DS2VASc = 5.   Popliteal artery embolism, right ([place])    a. 01/2017 in the setting of subRx INR-->s/p embolectomy.    Family History  Problem Relation Age of Onset   Coronary artery disease Other    Diabetes Other     Past Surgical History:  Procedure Laterality Date   APPLICATION  OF WOUND VAC  12/09/2020   Procedure: APPLICATION OF WOUND VAC;  Surgeon: [name], MD;  Location: [facility];  Service: Orthopedics;;   distal radius fracture. (12,/16/2010)     EMBOLECTOMY Right 02/15/2017   Procedure: EMBOLECTOMY RIGHT LEG;  Surgeon: Ruta Hinds  E, MD;  Location: [facility];  Service: Vascular;  Laterality: Right;   EYE SURGERY Bilateral 2021   cataracts removed   I & D EXTREMITY Right 03/05/2017   Procedure: IRRIGATION AND DEBRIDEMENT RIGHT LEG HEMATOMA EVACUATION;  Surgeon: Elam Dutch, MD;  Location: [facility];  Service: Vascular;  Laterality: Right;   IVC Placement 08/30/2005     LIPOMA EXCISION Left 12/09/2020   Procedure: EXCISION LIPOMA LEFT THIGH;  Surgeon: [name], MD;  Location: [facility];  Service: Orthopedics;  Laterality: Left;   Open reduction and internal fixation of left intra-articular     Social History   Occupational History   Not on file  Tobacco Use   Smoking status: Never   Smokeless tobacco: Never  Vaping Use   Vaping Use: Never used  Substance and Sexual Activity   Alcohol use: No   Drug use: No   Sexual activity: Not Currently

## 2021-02-08 ENCOUNTER — Telehealth: Payer: Self-pay | Admitting: Cardiology

## 2021-02-08 DIAGNOSIS — I050 Rheumatic mitral stenosis: Secondary | ICD-10-CM | POA: Diagnosis not present

## 2021-02-08 DIAGNOSIS — I110 Hypertensive heart disease with heart failure: Secondary | ICD-10-CM | POA: Diagnosis not present

## 2021-02-08 DIAGNOSIS — I739 Peripheral vascular disease, unspecified: Secondary | ICD-10-CM | POA: Diagnosis not present

## 2021-02-08 DIAGNOSIS — I4821 Permanent atrial fibrillation: Secondary | ICD-10-CM | POA: Diagnosis not present

## 2021-02-08 DIAGNOSIS — I5022 Chronic systolic (congestive) heart failure: Secondary | ICD-10-CM | POA: Diagnosis not present

## 2021-02-08 DIAGNOSIS — M5136 Other intervertebral disc degeneration, lumbar region: Secondary | ICD-10-CM | POA: Diagnosis not present

## 2021-02-08 LAB — POCT INR: INR: 3.9 — AB (ref 2.0–3.0)

## 2021-02-08 NOTE — Telephone Encounter (Signed)
Sarah with [facility] is calling to report patient's INR of 3.9, taken today. She states this is a little high for the patient. Please return call to Judson Roch to discuss at [phone].

## 2021-02-09 ENCOUNTER — Ambulatory Visit (INDEPENDENT_AMBULATORY_CARE_PROVIDER_SITE_OTHER): Payer: Medicare Other

## 2021-02-09 DIAGNOSIS — Z86711 Personal history of pulmonary embolism: Secondary | ICD-10-CM | POA: Diagnosis not present

## 2021-02-09 DIAGNOSIS — I4821 Permanent atrial fibrillation: Secondary | ICD-10-CM

## 2021-02-09 DIAGNOSIS — I5022 Chronic systolic (congestive) heart failure: Secondary | ICD-10-CM | POA: Diagnosis not present

## 2021-02-09 DIAGNOSIS — Z5181 Encounter for therapeutic drug level monitoring: Secondary | ICD-10-CM | POA: Diagnosis not present

## 2021-02-09 DIAGNOSIS — I050 Rheumatic mitral stenosis: Secondary | ICD-10-CM | POA: Diagnosis not present

## 2021-02-09 DIAGNOSIS — M5136 Other intervertebral disc degeneration, lumbar region: Secondary | ICD-10-CM | POA: Diagnosis not present

## 2021-02-09 DIAGNOSIS — I739 Peripheral vascular disease, unspecified: Secondary | ICD-10-CM | POA: Diagnosis not present

## 2021-02-09 DIAGNOSIS — I743 Embolism and thrombosis of arteries of the lower extremities: Secondary | ICD-10-CM

## 2021-02-09 DIAGNOSIS — I110 Hypertensive heart disease with heart failure: Secondary | ICD-10-CM | POA: Diagnosis not present

## 2021-02-09 NOTE — Telephone Encounter (Signed)
Attempted to call Judson Roch, [facility] RN back regarding pt's INR, LMOM TCB.  See anticoagulation note in Epic.

## 2021-02-09 NOTE — Patient Instructions (Signed)
Description   Spoke with Judson Roch, LPN with [facility] advised to have pt hold today's dose then resume taking Warfarin 1.5 tablets daily except for 2 tablets on Mondays &  Wednesdays. Recheck INR in 1 week. Coumadin Clinic # [phone]

## 2021-02-11 ENCOUNTER — Other Ambulatory Visit: Payer: Self-pay | Admitting: Cardiology

## 2021-02-15 ENCOUNTER — Ambulatory Visit (INDEPENDENT_AMBULATORY_CARE_PROVIDER_SITE_OTHER): Payer: Medicare Other

## 2021-02-15 DIAGNOSIS — I743 Embolism and thrombosis of arteries of the lower extremities: Secondary | ICD-10-CM | POA: Diagnosis not present

## 2021-02-15 DIAGNOSIS — Z5181 Encounter for therapeutic drug level monitoring: Secondary | ICD-10-CM

## 2021-02-15 DIAGNOSIS — I739 Peripheral vascular disease, unspecified: Secondary | ICD-10-CM | POA: Diagnosis not present

## 2021-02-15 DIAGNOSIS — M5136 Other intervertebral disc degeneration, lumbar region: Secondary | ICD-10-CM | POA: Diagnosis not present

## 2021-02-15 DIAGNOSIS — I050 Rheumatic mitral stenosis: Secondary | ICD-10-CM | POA: Diagnosis not present

## 2021-02-15 DIAGNOSIS — Z86711 Personal history of pulmonary embolism: Secondary | ICD-10-CM

## 2021-02-15 DIAGNOSIS — I4821 Permanent atrial fibrillation: Secondary | ICD-10-CM | POA: Diagnosis not present

## 2021-02-15 DIAGNOSIS — I110 Hypertensive heart disease with heart failure: Secondary | ICD-10-CM | POA: Diagnosis not present

## 2021-02-15 DIAGNOSIS — I5022 Chronic systolic (congestive) heart failure: Secondary | ICD-10-CM | POA: Diagnosis not present

## 2021-02-15 LAB — POCT INR: INR: 3.5 — AB (ref 2.0–3.0)

## 2021-02-15 NOTE — Patient Instructions (Signed)
Description   Spoke with Judson Roch, LPN with [facility] advised to have pt start taking Warfarin 1.5 tablets daily except for 2 tablets on Wednesdays. Recheck INR in 1 week will be HH last visit, will need follow-up appt scheduled in office. Coumadin Clinic # [phone]

## 2021-02-16 DIAGNOSIS — M5136 Other intervertebral disc degeneration, lumbar region: Secondary | ICD-10-CM | POA: Diagnosis not present

## 2021-02-16 DIAGNOSIS — I5022 Chronic systolic (congestive) heart failure: Secondary | ICD-10-CM | POA: Diagnosis not present

## 2021-02-16 DIAGNOSIS — I739 Peripheral vascular disease, unspecified: Secondary | ICD-10-CM | POA: Diagnosis not present

## 2021-02-16 DIAGNOSIS — I4821 Permanent atrial fibrillation: Secondary | ICD-10-CM | POA: Diagnosis not present

## 2021-02-16 DIAGNOSIS — I110 Hypertensive heart disease with heart failure: Secondary | ICD-10-CM | POA: Diagnosis not present

## 2021-02-16 DIAGNOSIS — I050 Rheumatic mitral stenosis: Secondary | ICD-10-CM | POA: Diagnosis not present

## 2021-02-23 DIAGNOSIS — M5136 Other intervertebral disc degeneration, lumbar region: Secondary | ICD-10-CM | POA: Diagnosis not present

## 2021-02-23 DIAGNOSIS — I4821 Permanent atrial fibrillation: Secondary | ICD-10-CM | POA: Diagnosis not present

## 2021-02-23 DIAGNOSIS — I739 Peripheral vascular disease, unspecified: Secondary | ICD-10-CM | POA: Diagnosis not present

## 2021-02-23 DIAGNOSIS — I050 Rheumatic mitral stenosis: Secondary | ICD-10-CM | POA: Diagnosis not present

## 2021-02-23 DIAGNOSIS — I5022 Chronic systolic (congestive) heart failure: Secondary | ICD-10-CM | POA: Diagnosis not present

## 2021-02-23 DIAGNOSIS — I110 Hypertensive heart disease with heart failure: Secondary | ICD-10-CM | POA: Diagnosis not present

## 2021-02-24 ENCOUNTER — Ambulatory Visit (INDEPENDENT_AMBULATORY_CARE_PROVIDER_SITE_OTHER): Payer: Medicare Other | Admitting: *Deleted

## 2021-02-24 DIAGNOSIS — Z5181 Encounter for therapeutic drug level monitoring: Secondary | ICD-10-CM | POA: Diagnosis not present

## 2021-02-24 DIAGNOSIS — M5136 Other intervertebral disc degeneration, lumbar region: Secondary | ICD-10-CM | POA: Diagnosis not present

## 2021-02-24 DIAGNOSIS — Z86711 Personal history of pulmonary embolism: Secondary | ICD-10-CM | POA: Diagnosis not present

## 2021-02-24 DIAGNOSIS — I743 Embolism and thrombosis of arteries of the lower extremities: Secondary | ICD-10-CM

## 2021-02-24 DIAGNOSIS — I739 Peripheral vascular disease, unspecified: Secondary | ICD-10-CM | POA: Diagnosis not present

## 2021-02-24 DIAGNOSIS — I4821 Permanent atrial fibrillation: Secondary | ICD-10-CM

## 2021-02-24 DIAGNOSIS — I5022 Chronic systolic (congestive) heart failure: Secondary | ICD-10-CM | POA: Diagnosis not present

## 2021-02-24 DIAGNOSIS — I110 Hypertensive heart disease with heart failure: Secondary | ICD-10-CM | POA: Diagnosis not present

## 2021-02-24 DIAGNOSIS — I050 Rheumatic mitral stenosis: Secondary | ICD-10-CM | POA: Diagnosis not present

## 2021-02-24 LAB — POCT INR: INR: 2.1 (ref 2.0–3.0)

## 2021-02-24 NOTE — Patient Instructions (Signed)
Description   Spoke with Stanton Kidney, RN with [facility] advised to have pt take 2.5 tablets today then continue taking Warfarin 1.5 tablets daily except for 2 tablets on Wednesdays. Recheck INR in 1 week in office. Coumadin Clinic # [phone]

## 2021-03-02 DIAGNOSIS — M5136 Other intervertebral disc degeneration, lumbar region: Secondary | ICD-10-CM | POA: Diagnosis not present

## 2021-03-02 DIAGNOSIS — I4821 Permanent atrial fibrillation: Secondary | ICD-10-CM | POA: Diagnosis not present

## 2021-03-02 DIAGNOSIS — I739 Peripheral vascular disease, unspecified: Secondary | ICD-10-CM | POA: Diagnosis not present

## 2021-03-02 DIAGNOSIS — I110 Hypertensive heart disease with heart failure: Secondary | ICD-10-CM | POA: Diagnosis not present

## 2021-03-02 DIAGNOSIS — I5022 Chronic systolic (congestive) heart failure: Secondary | ICD-10-CM | POA: Diagnosis not present

## 2021-03-02 DIAGNOSIS — I050 Rheumatic mitral stenosis: Secondary | ICD-10-CM | POA: Diagnosis not present

## 2021-03-03 DIAGNOSIS — Z7901 Long term (current) use of anticoagulants: Secondary | ICD-10-CM | POA: Diagnosis not present

## 2021-03-03 DIAGNOSIS — E669 Obesity, unspecified: Secondary | ICD-10-CM | POA: Diagnosis not present

## 2021-03-03 DIAGNOSIS — M5136 Other intervertebral disc degeneration, lumbar region: Secondary | ICD-10-CM | POA: Diagnosis not present

## 2021-03-03 DIAGNOSIS — Z9181 History of falling: Secondary | ICD-10-CM | POA: Diagnosis not present

## 2021-03-03 DIAGNOSIS — I5022 Chronic systolic (congestive) heart failure: Secondary | ICD-10-CM | POA: Diagnosis not present

## 2021-03-03 DIAGNOSIS — I050 Rheumatic mitral stenosis: Secondary | ICD-10-CM | POA: Diagnosis not present

## 2021-03-03 DIAGNOSIS — I4821 Permanent atrial fibrillation: Secondary | ICD-10-CM | POA: Diagnosis not present

## 2021-03-03 DIAGNOSIS — I739 Peripheral vascular disease, unspecified: Secondary | ICD-10-CM | POA: Diagnosis not present

## 2021-03-03 DIAGNOSIS — I110 Hypertensive heart disease with heart failure: Secondary | ICD-10-CM | POA: Diagnosis not present

## 2021-03-03 DIAGNOSIS — D6862 Lupus anticoagulant syndrome: Secondary | ICD-10-CM | POA: Diagnosis not present

## 2021-03-03 DIAGNOSIS — Z7982 Long term (current) use of aspirin: Secondary | ICD-10-CM | POA: Diagnosis not present

## 2021-03-03 DIAGNOSIS — Z4801 Encounter for change or removal of surgical wound dressing: Secondary | ICD-10-CM | POA: Diagnosis not present

## 2021-03-04 ENCOUNTER — Other Ambulatory Visit: Payer: Self-pay

## 2021-03-04 ENCOUNTER — Ambulatory Visit (INDEPENDENT_AMBULATORY_CARE_PROVIDER_SITE_OTHER): Payer: Medicare Other | Admitting: *Deleted

## 2021-03-04 DIAGNOSIS — I482 Chronic atrial fibrillation, unspecified: Secondary | ICD-10-CM | POA: Insufficient documentation

## 2021-03-04 DIAGNOSIS — I4821 Permanent atrial fibrillation: Secondary | ICD-10-CM

## 2021-03-04 DIAGNOSIS — Z5181 Encounter for therapeutic drug level monitoring: Secondary | ICD-10-CM

## 2021-03-04 DIAGNOSIS — I743 Embolism and thrombosis of arteries of the lower extremities: Secondary | ICD-10-CM

## 2021-03-04 DIAGNOSIS — Z86711 Personal history of pulmonary embolism: Secondary | ICD-10-CM

## 2021-03-04 LAB — POCT INR: INR: 1.7 — AB (ref 2.0–3.0)

## 2021-03-04 NOTE — Patient Instructions (Addendum)
Description   Today and tomorrow take 2 tablets then start taking Warfarin 1.5 tablets daily except for 2 tablets on Sundays and Wednesdays. Keep leafy veggies twice a week. Recheck INR in 2 weeks. Coumadin Clinic # [phone]

## 2021-03-04 NOTE — Progress Notes (Signed)
Cardiology Office Note   Date:  03/05/2021   ID:  SHERRA KIMMONS, DOB [birth year], MRN [id number]  PCP:  Josetta Huddle, MD  Cardiologist:   Minus Breeding, MD   Chief Complaint  Patient presents with   Atrial Fibrillation        History of Present Illness: [name] is a 75 y.o. female who presents for management of atrial fibrillation, with other history to include DVTs and bilateral PEs, with an IVC filter.  She has also had bilateral femoral artery embolectomy on 06/2008, history of embolic CVA in 2703 requiring TPA with subsequent hemorrhage conversion.  She also was found to have a right lower extremity popliteal embolism in the setting of subtherapeutic INR of 1.43 requiring surgical intervention.   Post surgery she developed a wound infection requiring surgery again on 03/05/2017 and a wound VAC was placed.   The patient also has a history of lupus anticoagulation disorder, obesity, nonischemic cardiomyopathy with EF of 40% to 45%.  (More recent EF 55 - 60% 6/22)  She had a leg lesion resected earlier this year and had hospitalization with bleeding following this.  I reviewed these records for this visit.      Since that hospitalization she has done okay.  She gets around with a walker if she goes long distances.  She uses her cane when she is in the house.  She moves slowly.  The patient denies any new symptoms such as chest discomfort, neck or arm discomfort. There has been no new shortness of breath, PND or orthopnea. There have been no reported palpitations, presyncope or syncope.  She really does not feel her atrial fibrillation.  She tolerates her anticoagulation and has had no bleeding.   Past Medical History:  Diagnosis Date   (HFpEF) heart failure with preserved ejection fraction ([place])    a. 06/2008 Echo: EF 55-60%, mild LVH, triv AI, mild to mod MS, mod to sev dil LA, mildly dil RA.   Acute right MCA stroke ([place])    a. 5/0/0938 Embolic stroke treated with TPA  and thrombectomy with hemorrhagic transformation; Carotids 3/12 negative for ICA stenosis.   Arthritis    Bilateral Pulmonary Emboli    a. 08/2005 - s/p IVC filter.   CHF (congestive heart failure) (HCC)    Depression    Embolus of femoral artery ([place])    a. 06/2008 s/p bilateral embolectomies.   History of DVT (deep vein thrombosis)    a. s/p IVC filter.   HIT (heparin-induced thrombocytopenia)    HTN (hypertension)    Lupus anticoagulant disorder (HCC)    Mitral stenosis    moderate-severe 09/04/20 echo   Obesity, unspecified    Permanent atrial fibrillation ([place])    a. On chronic Coumadin - INRs followed by PCP; b. CHA2DS2VASc = 5.   Popliteal artery embolism, right ([place])    a. 01/2017 in the setting of subRx INR-->s/p embolectomy.    Past Surgical History:  Procedure Laterality Date   APPLICATION OF WOUND VAC  12/09/2020   Procedure: APPLICATION OF WOUND VAC;  Surgeon: [name], MD;  Location: [place];  Service: Orthopedics;;   distal radius fracture. (12,/16/2010)     EMBOLECTOMY Right 02/15/2017   Procedure: EMBOLECTOMY RIGHT LEG;  Surgeon: Elam Dutch, MD;  Location: [place];  Service: Vascular;  Laterality: Right;   EYE SURGERY Bilateral 2021   cataracts removed   I & D EXTREMITY Right 03/05/2017   Procedure: IRRIGATION AND  DEBRIDEMENT RIGHT LEG HEMATOMA EVACUATION;  Surgeon: Elam Dutch, MD;  Location: [place];  Service: Vascular;  Laterality: Right;   IVC Placement 08/30/2005     LIPOMA EXCISION Left 12/09/2020   Procedure: EXCISION LIPOMA LEFT THIGH;  Surgeon: [name], MD;  Location: [place];  Service: Orthopedics;  Laterality: Left;   Open reduction and internal fixation of left intra-articular       Current Outpatient Medications  Medication Sig Dispense Refill   aspirin 81 MG tablet Take 81 mg by mouth daily.       Calcium Carb-Cholecalciferol (CALCIUM 600 + D PO) Take 1 tablet by mouth 2 (two) times daily.     Cholecalciferol (VITAMIN D) 50 MCG  (2000 UT) tablet Take 2,000 Units by mouth daily.     ferrous sulfate 325 (65 FE) MG tablet Take 1 tablet (325 mg total) by mouth daily with breakfast. 90 tablet 0   FLUoxetine (PROZAC) 20 MG capsule Take 20 mg by mouth daily.     furosemide (LASIX) 40 MG tablet Take 1 tablet by mouth once daily 90 tablet 0   HYDROcodone-acetaminophen (NORCO/VICODIN) 5-325 MG tablet Take 1 tablet by mouth every 4 (four) hours as needed for moderate pain. 15 tablet 0   lidocaine (LIDODERM) 5 % Place 1 patch onto the skin daily. Remove & Discard patch within 12 hours or as directed by MD 15 patch 0   methocarbamol (ROBAXIN) 500 MG tablet Take 1 tablet (500 mg total) by mouth every 6 (six) hours as needed for muscle spasms. 10 tablet 0   metoprolol tartrate (LOPRESSOR) 25 MG tablet Take 1 tablet by mouth twice daily 180 tablet 1   potassium chloride (K-DUR) 10 MEQ tablet TAKE 1 TABLET BY MOUTH ONCE DAILY (Patient taking differently: Take 10 mEq by mouth daily.) 90 tablet 1   senna (SENOKOT) 8.6 MG TABS tablet Take 2 tablets (17.2 mg total) by mouth 2 (two) times daily as needed for mild constipation or moderate constipation. 120 tablet 0   warfarin (COUMADIN) 3 MG tablet TAKE 1 TO 1 & 1/2 TABLETS BY MOUTH DAILY AS DIRECTED BY COUMADIN CLINIC 55 tablet 0   No current facility-administered medications for this visit.    Allergies:   Heparin and Other    ROS:  Please see the history of present illness.   Otherwise, review of systems are positive for none.   All other systems are reviewed and negative.    PHYSICAL EXAM: VS:  BP (!) 162/94   Pulse (!) 56   Ht 5\' 6"  (1.676 m)   Wt 240 lb (108.9 kg)   SpO2 95%   BMI 38.74 kg/m  , BMI Body mass index is 38.74 kg/m. GEN:  No distress NECK:  No jugular venous distention at 90 degrees, waveform within normal limits, carotid upstroke brisk and symmetric, no bruits, no thyromegaly LYMPHATICS:  No cervical adenopathy LUNGS:  Clear to auscultation bilaterally BACK:   No CVA tenderness CHEST:  Unremarkable HEART:  S1 and S2 within normal limits, no S3, no S4, no clicks, no rubs, no murmurs, difficult exam with distant heart sounds, irregular  ABD:  Positive bowel sounds normal in frequency in pitch, no bruits, no rebound, no guarding, unable to assess midline mass or bruit with the patient seated. EXT:  2 plus pulses throughout, mild left upper thigh edema trace ankle bilateral edema, no cyanosis no clubbing   EKG:  EKG is not ordered today. The ekg ordered 12/21/2020 demonstrates atrial  fibrillation, rate 77, axis within normal limits, low voltage limb leads, no acute ST-T wave changes.   Recent Labs: 09/04/2020: B Natriuretic Peptide 540.9; TSH 3.381 12/23/2020: ALT 10; BUN 14; Creatinine, Ser 1.08; Hemoglobin 8.7; Magnesium 2.1; Platelets 321; Potassium 3.7; Sodium 137    Lipid Panel    Component Value Date/Time   CHOL  06/04/2010 0359    139        ATP III CLASSIFICATION:  <200     mg/dL   Desirable  200-239  mg/dL   Borderline High  >=240    mg/dL   High          TRIG 71 06/04/2010 0359   HDL 33 (L) 06/04/2010 0359   CHOLHDL 4.2 06/04/2010 0359   VLDL 14 06/04/2010 0359   LDLCALC  06/04/2010 0359    92        Total Cholesterol/HDL:CHD Risk Coronary Heart Disease Risk Table                     Men   Women  1/2 Average Risk   3.4   3.3  Average Risk       5.0   4.4  2 X Average Risk   9.6   7.1  3 X Average Risk  23.4   11.0        Use the calculated Patient Ratio above and the CHD Risk Table to determine the patient's CHD Risk.        ATP III CLASSIFICATION (LDL):  <100     mg/dL   Optimal  100-129  mg/dL   Near or Above                    Optimal  130-159  mg/dL   Borderline  160-189  mg/dL   High  >190     mg/dL   Very High      Wt Readings from Last 3 Encounters:  03/05/21 240 lb (108.9 kg)  12/14/20 243 lb (110.2 kg)  12/09/20 243 lb (110.2 kg)      Other studies Reviewed: Additional studies/ records that were  reviewed today include: Hospital records. Review of the above records demonstrates:  Please see elsewhere in the note.     ASSESSMENT AND PLAN:   Chronic systolic heart failure: She seems to be euvolemic.  No change in therapy.  Chronic atrial fib:  [name] has a CHA2DS2 - VASc score of 6..  Moderate to severe mitral stenosis/ Mild MR:  Echo June 2022.   I will follow another echo when I see her back in 1 year.  Hypertension: Her blood pressure is at target.  No change in therapy.  She says it is elevated today because she did not take her medicines but is typically in the 130s.     Current medicines are reviewed at length with the patient today.  The patient does not have concerns regarding medicines.  The following changes have been made:  None Labs/ tests ordered today include:  None  No orders of the defined types were placed in this encounter.     Disposition:   FU with me in one year.    Signed, Minus Breeding, MD  03/05/2021 9:19 AM    [facility]

## 2021-03-05 ENCOUNTER — Ambulatory Visit (INDEPENDENT_AMBULATORY_CARE_PROVIDER_SITE_OTHER): Payer: Medicare Other | Admitting: Cardiology

## 2021-03-05 ENCOUNTER — Other Ambulatory Visit: Payer: Self-pay

## 2021-03-05 ENCOUNTER — Encounter: Payer: Self-pay | Admitting: Cardiology

## 2021-03-05 VITALS — BP 162/94 | HR 56 | Ht 66.0 in | Wt 240.0 lb

## 2021-03-05 DIAGNOSIS — I743 Embolism and thrombosis of arteries of the lower extremities: Secondary | ICD-10-CM | POA: Diagnosis not present

## 2021-03-05 DIAGNOSIS — I10 Essential (primary) hypertension: Secondary | ICD-10-CM | POA: Diagnosis not present

## 2021-03-05 DIAGNOSIS — I482 Chronic atrial fibrillation, unspecified: Secondary | ICD-10-CM | POA: Diagnosis not present

## 2021-03-05 DIAGNOSIS — I5022 Chronic systolic (congestive) heart failure: Secondary | ICD-10-CM | POA: Diagnosis not present

## 2021-03-05 DIAGNOSIS — I050 Rheumatic mitral stenosis: Secondary | ICD-10-CM

## 2021-03-05 MED ORDER — WARFARIN SODIUM 3 MG PO TABS: ORAL_TABLET | ORAL | 0 refills | Status: DC

## 2021-03-05 NOTE — Patient Instructions (Signed)
Medication Instructions:  °Your Physician recommend you continue on your current medication as directed.   ° °*If you need a refill on your cardiac medications before your next appointment, please call your pharmacy* ° °Follow-Up: °At [facility], you and your health needs are our priority.  As part of our continuing mission to provide you with exceptional heart care, we have created designated Provider Care Teams.  These Care Teams include your primary Cardiologist (physician) and Advanced Practice Providers (APPs -  Physician Assistants and Nurse Practitioners) who all work together to provide you with the care you need, when you need it. ° °We recommend signing up for the patient portal called "MyChart".  Sign up information is provided on this After Visit Summary.  MyChart is used to connect with patients for Virtual Visits (Telemedicine).  Patients are able to view lab/test results, encounter notes, upcoming appointments, etc.  Non-urgent messages can be sent to your provider as well.   °To learn more about what you can do with MyChart, go to [email or web address].   ° °Your next appointment:   °12 month(s) ° °The format for your next appointment:   °In Person ° °Provider:   °James Hochrein, MD  ° ° ° °

## 2021-03-09 DIAGNOSIS — I5022 Chronic systolic (congestive) heart failure: Secondary | ICD-10-CM | POA: Diagnosis not present

## 2021-03-09 DIAGNOSIS — M5136 Other intervertebral disc degeneration, lumbar region: Secondary | ICD-10-CM | POA: Diagnosis not present

## 2021-03-09 DIAGNOSIS — I050 Rheumatic mitral stenosis: Secondary | ICD-10-CM | POA: Diagnosis not present

## 2021-03-09 DIAGNOSIS — I739 Peripheral vascular disease, unspecified: Secondary | ICD-10-CM | POA: Diagnosis not present

## 2021-03-09 DIAGNOSIS — I4821 Permanent atrial fibrillation: Secondary | ICD-10-CM | POA: Diagnosis not present

## 2021-03-09 DIAGNOSIS — I110 Hypertensive heart disease with heart failure: Secondary | ICD-10-CM | POA: Diagnosis not present

## 2021-03-15 DIAGNOSIS — Z23 Encounter for immunization: Secondary | ICD-10-CM | POA: Diagnosis not present

## 2021-03-16 DIAGNOSIS — M5136 Other intervertebral disc degeneration, lumbar region: Secondary | ICD-10-CM | POA: Diagnosis not present

## 2021-03-16 DIAGNOSIS — I110 Hypertensive heart disease with heart failure: Secondary | ICD-10-CM | POA: Diagnosis not present

## 2021-03-16 DIAGNOSIS — I739 Peripheral vascular disease, unspecified: Secondary | ICD-10-CM | POA: Diagnosis not present

## 2021-03-16 DIAGNOSIS — I050 Rheumatic mitral stenosis: Secondary | ICD-10-CM | POA: Diagnosis not present

## 2021-03-16 DIAGNOSIS — I5022 Chronic systolic (congestive) heart failure: Secondary | ICD-10-CM | POA: Diagnosis not present

## 2021-03-16 DIAGNOSIS — I4821 Permanent atrial fibrillation: Secondary | ICD-10-CM | POA: Diagnosis not present

## 2021-03-22 ENCOUNTER — Other Ambulatory Visit: Payer: Self-pay | Admitting: Cardiology

## 2021-03-23 ENCOUNTER — Ambulatory Visit (INDEPENDENT_AMBULATORY_CARE_PROVIDER_SITE_OTHER): Payer: Medicare Other | Admitting: *Deleted

## 2021-03-23 ENCOUNTER — Other Ambulatory Visit: Payer: Self-pay

## 2021-03-23 DIAGNOSIS — Z5181 Encounter for therapeutic drug level monitoring: Secondary | ICD-10-CM

## 2021-03-23 DIAGNOSIS — I743 Embolism and thrombosis of arteries of the lower extremities: Secondary | ICD-10-CM | POA: Diagnosis not present

## 2021-03-23 DIAGNOSIS — I4821 Permanent atrial fibrillation: Secondary | ICD-10-CM

## 2021-03-23 DIAGNOSIS — Z86711 Personal history of pulmonary embolism: Secondary | ICD-10-CM | POA: Diagnosis not present

## 2021-03-23 LAB — POCT INR: INR: 2.7 (ref 2.0–3.0)

## 2021-03-23 NOTE — Telephone Encounter (Signed)
Prescription refill request received for warfarin Lov: 03/05/21 (Hochrein)  Next INR check: 03/23/21 Warfarin tablet strength: 3mg   Appropriate dose and refill sent to requested pharmacy.

## 2021-03-23 NOTE — Patient Instructions (Signed)
Description   Continue taking Warfarin 1.5 tablets daily except for 2 tablets on Sundays and Wednesdays. Keep leafy veggies twice a week. Recheck INR in 4 weeks. Coumadin Clinic # [phone]

## 2021-03-30 ENCOUNTER — Ambulatory Visit (HOSPITAL_COMMUNITY): Payer: Medicare Other | Attending: Cardiovascular Disease

## 2021-03-30 ENCOUNTER — Other Ambulatory Visit: Payer: Self-pay

## 2021-03-30 DIAGNOSIS — I342 Nonrheumatic mitral (valve) stenosis: Secondary | ICD-10-CM | POA: Insufficient documentation

## 2021-03-30 LAB — ECHOCARDIOGRAM COMPLETE
AR max vel: 1.25 cm2
AV Area VTI: 1.23 cm2
AV Area mean vel: 1.25 cm2
AV Mean grad: 11 mmHg
AV Peak grad: 16.6 mmHg
Ao pk vel: 2.04 m/s
Area-P 1/2: 1.63 cm2
MV M vel: 5.99 m/s
MV Peak grad: 143.3 mmHg
MV VTI: 0.98 cm2
P 1/2 time: 532 msec
Radius: 0.6 cm
S' Lateral: 3.3 cm

## 2021-04-20 ENCOUNTER — Ambulatory Visit (INDEPENDENT_AMBULATORY_CARE_PROVIDER_SITE_OTHER): Payer: Medicare Other

## 2021-04-20 ENCOUNTER — Other Ambulatory Visit: Payer: Self-pay

## 2021-04-20 DIAGNOSIS — I4821 Permanent atrial fibrillation: Secondary | ICD-10-CM | POA: Diagnosis not present

## 2021-04-20 DIAGNOSIS — Z5181 Encounter for therapeutic drug level monitoring: Secondary | ICD-10-CM

## 2021-04-20 DIAGNOSIS — Z86711 Personal history of pulmonary embolism: Secondary | ICD-10-CM | POA: Diagnosis not present

## 2021-04-20 DIAGNOSIS — I743 Embolism and thrombosis of arteries of the lower extremities: Secondary | ICD-10-CM

## 2021-04-20 LAB — POCT INR: INR: 1.7 — AB (ref 2.0–3.0)

## 2021-04-20 NOTE — Patient Instructions (Signed)
-   take extra 1/2 tablet tonight and tomorrow, then,  - on Thursday, resume taking Warfarin 1.5 tablets daily except for 2 tablets on Sundays and Wednesdays.  Keep leafy veggies twice a week.  - Recheck INR in 4 weeks. Coumadin Clinic # [phone]

## 2021-05-07 ENCOUNTER — Other Ambulatory Visit: Payer: Self-pay | Admitting: Cardiology

## 2021-05-17 ENCOUNTER — Other Ambulatory Visit: Payer: Self-pay | Admitting: Cardiology

## 2021-05-18 ENCOUNTER — Ambulatory Visit (INDEPENDENT_AMBULATORY_CARE_PROVIDER_SITE_OTHER): Payer: Medicare Other

## 2021-05-18 ENCOUNTER — Other Ambulatory Visit: Payer: Self-pay

## 2021-05-18 DIAGNOSIS — I4821 Permanent atrial fibrillation: Secondary | ICD-10-CM | POA: Diagnosis not present

## 2021-05-18 DIAGNOSIS — Z86711 Personal history of pulmonary embolism: Secondary | ICD-10-CM

## 2021-05-18 DIAGNOSIS — I743 Embolism and thrombosis of arteries of the lower extremities: Secondary | ICD-10-CM

## 2021-05-18 DIAGNOSIS — Z5181 Encounter for therapeutic drug level monitoring: Secondary | ICD-10-CM

## 2021-05-18 LAB — POCT INR: INR: 1.7 — AB (ref 2.0–3.0)

## 2021-05-18 NOTE — Patient Instructions (Signed)
Description   Take 2 tablets today, then start taking 2 tablets daily except 1.5 tablets on Tuesdays, Thursdays, and Saturdays.  Recheck in 2 weeks. Coumadin Clinic # [phone]

## 2021-05-21 ENCOUNTER — Telehealth: Payer: Self-pay | Admitting: Cardiology

## 2021-05-21 ENCOUNTER — Other Ambulatory Visit: Payer: Self-pay | Admitting: Cardiology

## 2021-05-21 DIAGNOSIS — I10 Essential (primary) hypertension: Secondary | ICD-10-CM

## 2021-05-21 NOTE — Telephone Encounter (Signed)
Spoke with pt, according to the discharge summary from the hospital 9/22 the losartan was stopped. She reports she has continued to take losartan 100 mg once daily. Will forward to dr hochrein to see if she needs to continue.

## 2021-05-21 NOTE — Telephone Encounter (Signed)
°*  STAT* If patient is at the pharmacy, call can be transferred to refill team.   1. Which medications need to be refilled? (please list name of each medication and dose if known) losartan (COZAAR) 100 MG tablet   2. Which pharmacy/location (including street and city if local pharmacy) is medication to be sent to?  3. Do they need a 30 day or 90 day supply? 90  Patient said her pharmacy did not refill this medication. She was not sure if Dr. Percival Spanish wanted her to continue taking this medicine

## 2021-05-27 MED ORDER — LOSARTAN POTASSIUM 100 MG PO TABS: 100.0000 mg | ORAL_TABLET | Freq: Every day | ORAL | 3 refills | Status: DC

## 2021-05-27 NOTE — Telephone Encounter (Signed)
Spoke with pt, aware of dr hochrein's recommendations. ?New script sent to the pharmacy  ?Lab orders mailed to the pt  ?

## 2021-05-31 ENCOUNTER — Other Ambulatory Visit: Payer: Self-pay | Admitting: Cardiology

## 2021-06-01 ENCOUNTER — Ambulatory Visit (INDEPENDENT_AMBULATORY_CARE_PROVIDER_SITE_OTHER): Payer: Medicare Other | Admitting: *Deleted

## 2021-06-01 ENCOUNTER — Other Ambulatory Visit: Payer: Self-pay

## 2021-06-01 DIAGNOSIS — I4821 Permanent atrial fibrillation: Secondary | ICD-10-CM | POA: Diagnosis not present

## 2021-06-01 DIAGNOSIS — Z5181 Encounter for therapeutic drug level monitoring: Secondary | ICD-10-CM

## 2021-06-01 DIAGNOSIS — I743 Embolism and thrombosis of arteries of the lower extremities: Secondary | ICD-10-CM | POA: Diagnosis not present

## 2021-06-01 DIAGNOSIS — Z86711 Personal history of pulmonary embolism: Secondary | ICD-10-CM | POA: Diagnosis not present

## 2021-06-01 LAB — POCT INR: INR: 3.3 — AB (ref 2.0–3.0)

## 2021-06-01 MED ORDER — WARFARIN SODIUM 3 MG PO TABS: ORAL_TABLET | ORAL | 2 refills | Status: DC

## 2021-06-01 NOTE — Patient Instructions (Signed)
Description   ?Continue taking Warfarin 2 tablets daily except 1.5 tablets on Tuesdays, Thursdays, and Saturdays. Recheck in 3 weeks. Coumadin Clinic # [phone] ?  ?  ?

## 2021-06-29 ENCOUNTER — Ambulatory Visit (INDEPENDENT_AMBULATORY_CARE_PROVIDER_SITE_OTHER): Payer: Medicare Other

## 2021-06-29 DIAGNOSIS — I743 Embolism and thrombosis of arteries of the lower extremities: Secondary | ICD-10-CM | POA: Diagnosis not present

## 2021-06-29 DIAGNOSIS — I4821 Permanent atrial fibrillation: Secondary | ICD-10-CM

## 2021-06-29 DIAGNOSIS — Z5181 Encounter for therapeutic drug level monitoring: Secondary | ICD-10-CM

## 2021-06-29 DIAGNOSIS — Z86711 Personal history of pulmonary embolism: Secondary | ICD-10-CM

## 2021-06-29 LAB — POCT INR: INR: 1.6 — AB (ref 2.0–3.0)

## 2021-06-29 NOTE — Patient Instructions (Signed)
Description   ?Take 2 tablets today, then take 2.5 tablets tomorrow, then resume same dosage of Warfarin 2 tablets daily except 1.5 tablets on Tuesdays, Thursdays, and Saturdays. Recheck in 2 weeks. Coumadin Clinic # [phone] ?  ?  ?

## 2021-06-30 ENCOUNTER — Other Ambulatory Visit: Payer: Self-pay | Admitting: Cardiology

## 2021-07-05 ENCOUNTER — Telehealth: Payer: Self-pay

## 2021-07-05 NOTE — Telephone Encounter (Signed)
Pt called to let us know that she ran out of warfarin and missed 2 doses over the weekend.  ?Her normal dose of warfarin is 3 mg - 2 tablets daily except 1.5 tablets on Tuesdays, Thursdays, and Saturdays. ?Advised her to take extra 1/2 tablet today & tomorrow and keep appt for next week. ?She verbalizes understanding and is appreciative.  ?

## 2021-07-22 ENCOUNTER — Ambulatory Visit (INDEPENDENT_AMBULATORY_CARE_PROVIDER_SITE_OTHER): Payer: Medicare Other | Admitting: *Deleted

## 2021-07-22 DIAGNOSIS — I4821 Permanent atrial fibrillation: Secondary | ICD-10-CM

## 2021-07-22 DIAGNOSIS — I743 Embolism and thrombosis of arteries of the lower extremities: Secondary | ICD-10-CM | POA: Diagnosis not present

## 2021-07-22 DIAGNOSIS — Z5181 Encounter for therapeutic drug level monitoring: Secondary | ICD-10-CM

## 2021-07-22 DIAGNOSIS — Z86711 Personal history of pulmonary embolism: Secondary | ICD-10-CM

## 2021-07-22 LAB — POCT INR: INR: 2.7 (ref 2.0–3.0)

## 2021-07-22 NOTE — Patient Instructions (Signed)
Description   ?Continue taking Warfarin 2 tablets daily except 1.5 tablets on Tuesdays, Thursdays, and Saturdays. Recheck in 3 weeks. Coumadin Clinic # [phone] ?  ?  ?

## 2021-08-12 ENCOUNTER — Ambulatory Visit (INDEPENDENT_AMBULATORY_CARE_PROVIDER_SITE_OTHER): Payer: Medicare Other

## 2021-08-12 DIAGNOSIS — I743 Embolism and thrombosis of arteries of the lower extremities: Secondary | ICD-10-CM

## 2021-08-12 DIAGNOSIS — I4821 Permanent atrial fibrillation: Secondary | ICD-10-CM | POA: Diagnosis not present

## 2021-08-12 DIAGNOSIS — Z5181 Encounter for therapeutic drug level monitoring: Secondary | ICD-10-CM

## 2021-08-12 DIAGNOSIS — Z86711 Personal history of pulmonary embolism: Secondary | ICD-10-CM

## 2021-08-12 LAB — POCT INR: INR: 1.6 — AB (ref 2.0–3.0)

## 2021-08-12 NOTE — Patient Instructions (Signed)
Description   Take 2 tablets today and 2.5 tablets tomorrow and then continue taking Warfarin 2 tablets daily except 1.5 tablets on Tuesdays, Thursdays, and Saturdays.  Recheck in 1 week.  Coumadin Clinic # [phone]

## 2021-08-19 ENCOUNTER — Ambulatory Visit (INDEPENDENT_AMBULATORY_CARE_PROVIDER_SITE_OTHER): Payer: Medicare Other | Admitting: *Deleted

## 2021-08-19 DIAGNOSIS — I743 Embolism and thrombosis of arteries of the lower extremities: Secondary | ICD-10-CM | POA: Diagnosis not present

## 2021-08-19 DIAGNOSIS — I4821 Permanent atrial fibrillation: Secondary | ICD-10-CM | POA: Diagnosis not present

## 2021-08-19 DIAGNOSIS — Z5181 Encounter for therapeutic drug level monitoring: Secondary | ICD-10-CM | POA: Diagnosis not present

## 2021-08-19 DIAGNOSIS — Z86711 Personal history of pulmonary embolism: Secondary | ICD-10-CM

## 2021-08-19 LAB — POCT INR: INR: 3.5 — AB (ref 2.0–3.0)

## 2021-08-19 NOTE — Patient Instructions (Signed)
Description   Continue taking Warfarin 2 tablets daily except 1.5 tablets on Tuesdays, Thursdays, and Saturdays. Resume at least 2 veggies daily. Recheck in 2 weeks. Coumadin Clinic # [phone]

## 2021-08-31 ENCOUNTER — Other Ambulatory Visit: Payer: Self-pay | Admitting: Cardiology

## 2021-09-03 ENCOUNTER — Ambulatory Visit (INDEPENDENT_AMBULATORY_CARE_PROVIDER_SITE_OTHER): Payer: Medicare Other

## 2021-09-03 DIAGNOSIS — Z5181 Encounter for therapeutic drug level monitoring: Secondary | ICD-10-CM

## 2021-09-03 DIAGNOSIS — Z86711 Personal history of pulmonary embolism: Secondary | ICD-10-CM

## 2021-09-03 DIAGNOSIS — I743 Embolism and thrombosis of arteries of the lower extremities: Secondary | ICD-10-CM | POA: Diagnosis not present

## 2021-09-03 DIAGNOSIS — I4821 Permanent atrial fibrillation: Secondary | ICD-10-CM | POA: Diagnosis not present

## 2021-09-03 LAB — POCT INR: INR: 2.3 (ref 2.0–3.0)

## 2021-09-03 NOTE — Patient Instructions (Signed)
Description   Take 3 tablets today and then continue taking Warfarin 2 tablets daily except 1.5 tablets on Tuesdays, Thursdays, and Saturdays.  Stay consistent with greens each week (2 per week)  Recheck in 2 weeks.  Coumadin Clinic # [phone]

## 2021-09-14 DIAGNOSIS — H26493 Other secondary cataract, bilateral: Secondary | ICD-10-CM | POA: Diagnosis not present

## 2021-09-14 DIAGNOSIS — H524 Presbyopia: Secondary | ICD-10-CM | POA: Diagnosis not present

## 2021-09-21 ENCOUNTER — Ambulatory Visit (INDEPENDENT_AMBULATORY_CARE_PROVIDER_SITE_OTHER): Payer: Medicare Other | Admitting: *Deleted

## 2021-09-21 DIAGNOSIS — I4821 Permanent atrial fibrillation: Secondary | ICD-10-CM | POA: Diagnosis not present

## 2021-09-21 DIAGNOSIS — Z5181 Encounter for therapeutic drug level monitoring: Secondary | ICD-10-CM | POA: Diagnosis not present

## 2021-09-21 DIAGNOSIS — Z86711 Personal history of pulmonary embolism: Secondary | ICD-10-CM

## 2021-09-21 DIAGNOSIS — I743 Embolism and thrombosis of arteries of the lower extremities: Secondary | ICD-10-CM | POA: Diagnosis not present

## 2021-09-21 LAB — POCT INR: INR: 2.5 (ref 2.0–3.0)

## 2021-10-12 ENCOUNTER — Other Ambulatory Visit: Payer: Self-pay | Admitting: Cardiology

## 2021-10-12 DIAGNOSIS — I4821 Permanent atrial fibrillation: Secondary | ICD-10-CM

## 2021-10-14 ENCOUNTER — Other Ambulatory Visit: Payer: Self-pay | Admitting: Cardiology

## 2021-10-14 DIAGNOSIS — I4821 Permanent atrial fibrillation: Secondary | ICD-10-CM

## 2021-10-14 NOTE — Telephone Encounter (Signed)
Pt contacted this coumadin clinic, [facility], requesting refill of warfarin. Pt has warfarin managed with cardiology, not [facility]. Pt gave verbal authorization to enter her chart to determine if refill had been sent. Advised cardiology sent refill on 7/20. Advised if any problems to contact cardiology clinic. Pt verbalized understanding and was very appreciative.

## 2021-10-19 ENCOUNTER — Ambulatory Visit (INDEPENDENT_AMBULATORY_CARE_PROVIDER_SITE_OTHER): Payer: Medicare Other

## 2021-10-19 DIAGNOSIS — Z5181 Encounter for therapeutic drug level monitoring: Secondary | ICD-10-CM

## 2021-10-19 DIAGNOSIS — I743 Embolism and thrombosis of arteries of the lower extremities: Secondary | ICD-10-CM | POA: Diagnosis not present

## 2021-10-19 DIAGNOSIS — I4821 Permanent atrial fibrillation: Secondary | ICD-10-CM | POA: Diagnosis not present

## 2021-10-19 DIAGNOSIS — Z86711 Personal history of pulmonary embolism: Secondary | ICD-10-CM | POA: Diagnosis not present

## 2021-10-19 LAB — POCT INR: INR: 1.7 — AB (ref 2.0–3.0)

## 2021-10-19 NOTE — Patient Instructions (Signed)
Description   Take 2.5 tablets today and then continue taking Warfarin 2 tablets daily except 1.5 tablets on Tuesdays, Thursdays, and Saturdays. Stay consistent with greens each week (2 per week)  Recheck in 3 weeks. Coumadin Clinic # [phone]

## 2021-11-09 ENCOUNTER — Ambulatory Visit (INDEPENDENT_AMBULATORY_CARE_PROVIDER_SITE_OTHER): Payer: Medicare Other

## 2021-11-09 DIAGNOSIS — I743 Embolism and thrombosis of arteries of the lower extremities: Secondary | ICD-10-CM

## 2021-11-09 DIAGNOSIS — Z86711 Personal history of pulmonary embolism: Secondary | ICD-10-CM | POA: Diagnosis not present

## 2021-11-09 DIAGNOSIS — I4821 Permanent atrial fibrillation: Secondary | ICD-10-CM | POA: Diagnosis not present

## 2021-11-09 DIAGNOSIS — Z5181 Encounter for therapeutic drug level monitoring: Secondary | ICD-10-CM | POA: Diagnosis not present

## 2021-11-09 LAB — POCT INR: INR: 2.3 (ref 2.0–3.0)

## 2021-11-09 NOTE — Patient Instructions (Signed)
Take 2 tablets today and then continue taking Warfarin 2 tablets daily except 1.5 tablets on Tuesdays, Thursdays, and Saturdays. Stay consistent with greens each week (2 per week)  Recheck in 4 weeks. Coumadin Clinic # [phone]

## 2021-12-07 ENCOUNTER — Ambulatory Visit: Payer: Medicare Other | Attending: Cardiology | Admitting: *Deleted

## 2021-12-07 DIAGNOSIS — Z86711 Personal history of pulmonary embolism: Secondary | ICD-10-CM

## 2021-12-07 DIAGNOSIS — I4821 Permanent atrial fibrillation: Secondary | ICD-10-CM | POA: Diagnosis not present

## 2021-12-07 DIAGNOSIS — Z5181 Encounter for therapeutic drug level monitoring: Secondary | ICD-10-CM

## 2021-12-07 DIAGNOSIS — I743 Embolism and thrombosis of arteries of the lower extremities: Secondary | ICD-10-CM

## 2021-12-07 LAB — POCT INR: INR: 3.2 — AB (ref 2.0–3.0)

## 2021-12-07 NOTE — Patient Instructions (Signed)
Description   Continue taking Warfarin 2 tablets daily except 1.5 tablets on Tuesdays, Thursdays, and Saturdays. Stay consistent with greens each week (2 per week).   Recheck in 5 weeks. Coumadin Clinic # [phone] or [phone]

## 2022-01-03 DIAGNOSIS — Z23 Encounter for immunization: Secondary | ICD-10-CM | POA: Diagnosis not present

## 2022-01-14 ENCOUNTER — Ambulatory Visit: Payer: Medicare Other

## 2022-01-24 ENCOUNTER — Ambulatory Visit: Payer: Medicare Other

## 2022-02-01 ENCOUNTER — Ambulatory Visit: Payer: Medicare Other | Attending: Cardiology

## 2022-02-01 DIAGNOSIS — Z5181 Encounter for therapeutic drug level monitoring: Secondary | ICD-10-CM

## 2022-02-01 DIAGNOSIS — Z86711 Personal history of pulmonary embolism: Secondary | ICD-10-CM | POA: Diagnosis not present

## 2022-02-01 DIAGNOSIS — I743 Embolism and thrombosis of arteries of the lower extremities: Secondary | ICD-10-CM | POA: Diagnosis not present

## 2022-02-01 DIAGNOSIS — I4821 Permanent atrial fibrillation: Secondary | ICD-10-CM

## 2022-02-01 LAB — POCT INR: INR: 5.4 — AB (ref 2.0–3.0)

## 2022-02-01 NOTE — Patient Instructions (Signed)
HOLD TODAY AND TAKE ONLY 1 TABLET WEDNESDAY then Continue taking Warfarin 2 tablets daily except 1.5 tablets on Tuesdays, Thursdays, and Saturdays. Stay consistent with greens each week (2 per week).   Recheck in 1 week. Coumadin Clinic # [phone] or [phone]

## 2022-02-20 ENCOUNTER — Inpatient Hospital Stay (HOSPITAL_COMMUNITY)
Admission: EM | Admit: 2022-02-20 | Discharge: 2022-02-25 | DRG: 871 | Disposition: A | Discharge status: Home Health | Payer: Medicare Other | Attending: Internal Medicine | Admitting: Internal Medicine

## 2022-02-20 ENCOUNTER — Emergency Department (HOSPITAL_COMMUNITY): Payer: Medicare Other

## 2022-02-20 ENCOUNTER — Encounter (HOSPITAL_COMMUNITY): Payer: Self-pay

## 2022-02-20 ENCOUNTER — Other Ambulatory Visit: Payer: Self-pay

## 2022-02-20 DIAGNOSIS — I5042 Chronic combined systolic (congestive) and diastolic (congestive) heart failure: Secondary | ICD-10-CM | POA: Diagnosis present

## 2022-02-20 DIAGNOSIS — R059 Cough, unspecified: Secondary | ICD-10-CM | POA: Diagnosis not present

## 2022-02-20 DIAGNOSIS — I482 Chronic atrial fibrillation, unspecified: Secondary | ICD-10-CM | POA: Diagnosis present

## 2022-02-20 DIAGNOSIS — I342 Nonrheumatic mitral (valve) stenosis: Secondary | ICD-10-CM | POA: Diagnosis not present

## 2022-02-20 DIAGNOSIS — F32A Depression, unspecified: Secondary | ICD-10-CM | POA: Diagnosis present

## 2022-02-20 DIAGNOSIS — I340 Nonrheumatic mitral (valve) insufficiency: Secondary | ICD-10-CM | POA: Diagnosis not present

## 2022-02-20 DIAGNOSIS — E860 Dehydration: Secondary | ICD-10-CM | POA: Diagnosis not present

## 2022-02-20 DIAGNOSIS — A419 Sepsis, unspecified organism: Secondary | ICD-10-CM | POA: Diagnosis present

## 2022-02-20 DIAGNOSIS — Z7982 Long term (current) use of aspirin: Secondary | ICD-10-CM

## 2022-02-20 DIAGNOSIS — Z8673 Personal history of transient ischemic attack (TIA), and cerebral infarction without residual deficits: Secondary | ICD-10-CM | POA: Diagnosis not present

## 2022-02-20 DIAGNOSIS — N3000 Acute cystitis without hematuria: Secondary | ICD-10-CM

## 2022-02-20 DIAGNOSIS — R0902 Hypoxemia: Secondary | ICD-10-CM | POA: Diagnosis not present

## 2022-02-20 DIAGNOSIS — Z79899 Other long term (current) drug therapy: Secondary | ICD-10-CM

## 2022-02-20 DIAGNOSIS — R6889 Other general symptoms and signs: Secondary | ICD-10-CM | POA: Diagnosis not present

## 2022-02-20 DIAGNOSIS — J9690 Respiratory failure, unspecified, unspecified whether with hypoxia or hypercapnia: Secondary | ICD-10-CM | POA: Diagnosis not present

## 2022-02-20 DIAGNOSIS — Z888 Allergy status to other drugs, medicaments and biological substances status: Secondary | ICD-10-CM

## 2022-02-20 DIAGNOSIS — I050 Rheumatic mitral stenosis: Secondary | ICD-10-CM | POA: Diagnosis not present

## 2022-02-20 DIAGNOSIS — E876 Hypokalemia: Secondary | ICD-10-CM | POA: Insufficient documentation

## 2022-02-20 DIAGNOSIS — Z95828 Presence of other vascular implants and grafts: Secondary | ICD-10-CM

## 2022-02-20 DIAGNOSIS — N390 Urinary tract infection, site not specified: Secondary | ICD-10-CM | POA: Diagnosis not present

## 2022-02-20 DIAGNOSIS — J9601 Acute respiratory failure with hypoxia: Secondary | ICD-10-CM | POA: Diagnosis present

## 2022-02-20 DIAGNOSIS — I10 Essential (primary) hypertension: Secondary | ICD-10-CM | POA: Diagnosis not present

## 2022-02-20 DIAGNOSIS — B9620 Unspecified Escherichia coli [E. coli] as the cause of diseases classified elsewhere: Secondary | ICD-10-CM | POA: Diagnosis present

## 2022-02-20 DIAGNOSIS — I4821 Permanent atrial fibrillation: Secondary | ICD-10-CM | POA: Diagnosis not present

## 2022-02-20 DIAGNOSIS — N179 Acute kidney failure, unspecified: Secondary | ICD-10-CM | POA: Diagnosis present

## 2022-02-20 DIAGNOSIS — I499 Cardiac arrhythmia, unspecified: Secondary | ICD-10-CM | POA: Diagnosis not present

## 2022-02-20 DIAGNOSIS — D6862 Lupus anticoagulant syndrome: Secondary | ICD-10-CM | POA: Diagnosis present

## 2022-02-20 DIAGNOSIS — K529 Noninfective gastroenteritis and colitis, unspecified: Secondary | ICD-10-CM | POA: Diagnosis present

## 2022-02-20 DIAGNOSIS — I110 Hypertensive heart disease with heart failure: Secondary | ICD-10-CM | POA: Diagnosis present

## 2022-02-20 DIAGNOSIS — D539 Nutritional anemia, unspecified: Secondary | ICD-10-CM | POA: Diagnosis present

## 2022-02-20 DIAGNOSIS — R791 Abnormal coagulation profile: Secondary | ICD-10-CM | POA: Insufficient documentation

## 2022-02-20 DIAGNOSIS — I4891 Unspecified atrial fibrillation: Secondary | ICD-10-CM | POA: Diagnosis not present

## 2022-02-20 DIAGNOSIS — R531 Weakness: Secondary | ICD-10-CM | POA: Diagnosis not present

## 2022-02-20 DIAGNOSIS — Z6841 Body Mass Index (BMI) 40.0 and over, adult: Secondary | ICD-10-CM | POA: Diagnosis not present

## 2022-02-20 DIAGNOSIS — Z7901 Long term (current) use of anticoagulants: Secondary | ICD-10-CM

## 2022-02-20 DIAGNOSIS — Z86711 Personal history of pulmonary embolism: Secondary | ICD-10-CM

## 2022-02-20 DIAGNOSIS — Z20822 Contact with and (suspected) exposure to covid-19: Secondary | ICD-10-CM | POA: Diagnosis present

## 2022-02-20 DIAGNOSIS — J189 Pneumonia, unspecified organism: Secondary | ICD-10-CM | POA: Diagnosis present

## 2022-02-20 DIAGNOSIS — Z8249 Family history of ischemic heart disease and other diseases of the circulatory system: Secondary | ICD-10-CM | POA: Diagnosis not present

## 2022-02-20 DIAGNOSIS — Z743 Need for continuous supervision: Secondary | ICD-10-CM | POA: Diagnosis not present

## 2022-02-20 DIAGNOSIS — Z9841 Cataract extraction status, right eye: Secondary | ICD-10-CM

## 2022-02-20 DIAGNOSIS — Z86718 Personal history of other venous thrombosis and embolism: Secondary | ICD-10-CM | POA: Diagnosis not present

## 2022-02-20 DIAGNOSIS — J121 Respiratory syncytial virus pneumonia: Secondary | ICD-10-CM | POA: Diagnosis not present

## 2022-02-20 DIAGNOSIS — R0602 Shortness of breath: Secondary | ICD-10-CM | POA: Diagnosis not present

## 2022-02-20 DIAGNOSIS — Z9842 Cataract extraction status, left eye: Secondary | ICD-10-CM

## 2022-02-20 LAB — CBC WITH DIFFERENTIAL/PLATELET
Abs Immature Granulocytes: 0.17 10*3/uL — ABNORMAL HIGH (ref 0.00–0.07)
Basophils Absolute: 0 10*3/uL (ref 0.0–0.1)
Basophils Relative: 0 %
Eosinophils Absolute: 0 10*3/uL (ref 0.0–0.5)
Eosinophils Relative: 0 %
HCT: 42.9 % (ref 36.0–46.0)
Hemoglobin: 14 g/dL (ref 12.0–15.0)
Immature Granulocytes: 1 %
Lymphocytes Relative: 7 %
Lymphs Abs: 1 10*3/uL (ref 0.7–4.0)
MCH: 34.1 pg — ABNORMAL HIGH (ref 26.0–34.0)
MCHC: 32.6 g/dL (ref 30.0–36.0)
MCV: 104.6 fL — ABNORMAL HIGH (ref 80.0–100.0)
Monocytes Absolute: 1.3 10*3/uL — ABNORMAL HIGH (ref 0.1–1.0)
Monocytes Relative: 9 %
Neutro Abs: 11.3 10*3/uL — ABNORMAL HIGH (ref 1.7–7.7)
Neutrophils Relative %: 83 %
Platelets: 196 10*3/uL (ref 150–400)
RBC: 4.1 MIL/uL (ref 3.87–5.11)
RDW: 13.5 % (ref 11.5–15.5)
WBC: 13.8 10*3/uL — ABNORMAL HIGH (ref 4.0–10.5)
nRBC: 0.1 % (ref 0.0–0.2)

## 2022-02-20 LAB — COMPREHENSIVE METABOLIC PANEL
ALT: 15 U/L (ref 0–44)
AST: 21 U/L (ref 15–41)
Albumin: 2.8 g/dL — ABNORMAL LOW (ref 3.5–5.0)
Alkaline Phosphatase: 74 U/L (ref 38–126)
Anion gap: 15 (ref 5–15)
BUN: 42 mg/dL — ABNORMAL HIGH (ref 8–23)
CO2: 24 mmol/L (ref 22–32)
Calcium: 9 mg/dL (ref 8.9–10.3)
Chloride: 102 mmol/L (ref 98–111)
Creatinine, Ser: 1.64 mg/dL — ABNORMAL HIGH (ref 0.44–1.00)
GFR, Estimated: 32 mL/min — ABNORMAL LOW (ref >60)
Glucose, Bld: 183 mg/dL — ABNORMAL HIGH (ref 70–99)
Potassium: 3.3 mmol/L — ABNORMAL LOW (ref 3.5–5.1)
Sodium: 141 mmol/L (ref 135–145)
Total Bilirubin: 1.4 mg/dL — ABNORMAL HIGH (ref 0.3–1.2)
Total Protein: 7.2 g/dL (ref 6.5–8.1)

## 2022-02-20 LAB — RESP PANEL BY RT-PCR (FLU A&B, COVID) ARPGX2
Influenza A by PCR: NEGATIVE
Influenza B by PCR: NEGATIVE
SARS Coronavirus 2 by RT PCR: NEGATIVE

## 2022-02-20 LAB — URINALYSIS, ROUTINE W REFLEX MICROSCOPIC
Bilirubin Urine: NEGATIVE
Glucose, UA: NEGATIVE mg/dL
Ketones, ur: NEGATIVE mg/dL
Leukocytes,Ua: NEGATIVE
Nitrite: POSITIVE — AB
Protein, ur: 100 mg/dL — AB
Specific Gravity, Urine: 1.026 (ref 1.005–1.030)
pH: 5 (ref 5.0–8.0)

## 2022-02-20 LAB — PROTIME-INR
INR: 6.8 (ref 0.8–1.2)
Prothrombin Time: 58.7 seconds — ABNORMAL HIGH (ref 11.4–15.2)

## 2022-02-20 LAB — PHOSPHORUS: Phosphorus: 2 mg/dL — ABNORMAL LOW (ref 2.5–4.6)

## 2022-02-20 LAB — MAGNESIUM: Magnesium: 2.2 mg/dL (ref 1.7–2.4)

## 2022-02-20 MED ORDER — SODIUM CHLORIDE 0.9 % IV SOLN
2.0000 g | Freq: Once | INTRAVENOUS | Status: AC
  Administered 2022-02-20: 2 g via INTRAVENOUS
  Filled 2022-02-20: qty 20

## 2022-02-20 MED ORDER — POTASSIUM PHOSPHATES 15 MMOLE/5ML IV SOLN
15.0000 mmol | Freq: Once | INTRAVENOUS | Status: AC
  Administered 2022-02-21: 15 mmol via INTRAVENOUS
  Filled 2022-02-20: qty 5

## 2022-02-20 MED ORDER — SODIUM CHLORIDE 0.9 % IV SOLN: INTRAVENOUS | Status: DC

## 2022-02-20 MED ORDER — ALBUTEROL SULFATE HFA 108 (90 BASE) MCG/ACT IN AERS
2.0000 | INHALATION_SPRAY | Freq: Once | RESPIRATORY_TRACT | Status: AC
  Administered 2022-02-20: 2 via RESPIRATORY_TRACT
  Filled 2022-02-20: qty 6.7

## 2022-02-20 MED ORDER — WARFARIN - PHARMACIST DOSING INPATIENT: Freq: Every day | Status: DC

## 2022-02-20 MED ORDER — LACTATED RINGERS IV BOLUS
1000.0000 mL | Freq: Once | INTRAVENOUS | Status: AC
  Administered 2022-02-20: 1000 mL via INTRAVENOUS

## 2022-02-20 MED ORDER — METOPROLOL TARTRATE 5 MG/5ML IV SOLN
5.0000 mg | Freq: Four times a day (QID) | INTRAVENOUS | Status: DC | PRN
  Administered 2022-02-21 (×2): 5 mg via INTRAVENOUS
  Filled 2022-02-20 (×2): qty 5

## 2022-02-20 MED ORDER — AEROCHAMBER PLUS FLO-VU LARGE MISC
Status: AC
  Filled 2022-02-20: qty 1

## 2022-02-20 MED ORDER — POTASSIUM CHLORIDE CRYS ER 20 MEQ PO TBCR
40.0000 meq | EXTENDED_RELEASE_TABLET | Freq: Once | ORAL | Status: AC
  Administered 2022-02-20: 40 meq via ORAL
  Filled 2022-02-20: qty 2

## 2022-02-20 MED ORDER — METOPROLOL TARTRATE 5 MG/5ML IV SOLN
5.0000 mg | Freq: Once | INTRAVENOUS | Status: AC
  Administered 2022-02-20: 5 mg via INTRAVENOUS
  Filled 2022-02-20: qty 5

## 2022-02-20 MED ORDER — SODIUM CHLORIDE 0.9 % IV SOLN
2.0000 g | INTRAVENOUS | Status: AC
  Administered 2022-02-21 – 2022-02-24 (×4): 2 g via INTRAVENOUS
  Filled 2022-02-20 (×4): qty 20

## 2022-02-20 MED ORDER — SODIUM CHLORIDE 0.9 % IV SOLN
500.0000 mg | INTRAVENOUS | Status: DC
  Administered 2022-02-20 – 2022-02-23 (×3): 500 mg via INTRAVENOUS
  Filled 2022-02-20 (×5): qty 5

## 2022-02-20 NOTE — ED Notes (Signed)
Hospitalist at bedside 

## 2022-02-20 NOTE — Progress Notes (Signed)
ANTICOAGULATION CONSULT NOTE - Initial Consult  Pharmacy Consult for warfarin Indication: atrial fibrillation  Allergies  Allergen Reactions   Heparin Hives    Patient attests severe allergy- rash all over, severe itching, unable to take heparin   Other Itching and Rash    "Sheets and Linens used by [facility]"    Patient Measurements: Height: '5\' 5"'$  (165.1 cm) Weight: 121.1 kg (267 lb) IBW/kg (Calculated) : 57 Heparin Dosing Weight: 86.2 kg   Vital Signs: Temp: 99.4 F (37.4 C) (11/26 2104) Temp Source: Oral (11/26 1547) BP: 133/91 (11/26 2104) Pulse Rate: 113 (11/26 2104)  Labs: Recent Labs    02/20/22 1620 02/20/22 1639  HGB 14.0  --   HCT 42.9  --   PLT 196  --   LABPROT  --  58.7*  INR  --  6.8*  CREATININE 1.64*  --     Estimated Creatinine Clearance: 38.1 mL/min (A) (by C-G formula based on SCr of 1.64 mg/dL (H)).   Medical History: Past Medical History:  Diagnosis Date   (HFpEF) heart failure with preserved ejection fraction ([place])    a. 06/2008 Echo: EF 55-60%, mild LVH, triv AI, mild to mod MS, mod to sev dil LA, mildly dil RA.   Acute right MCA stroke ([place])    a. 06/03/1015 Embolic stroke treated with TPA and thrombectomy with hemorrhagic transformation; Carotids 3/12 negative for ICA stenosis.   Arthritis    Bilateral Pulmonary Emboli    a. 08/2005 - s/p IVC filter.   CHF (congestive heart failure) (HCC)    Depression    Embolus of femoral artery ([place])    a. 06/2008 s/p bilateral embolectomies.   History of DVT (deep vein thrombosis)    a. s/p IVC filter.   HIT (heparin-induced thrombocytopenia) (HCC)    HTN (hypertension)    Lupus anticoagulant disorder (HCC)    Mitral stenosis    moderate-severe 09/04/20 echo   Obesity, unspecified    Permanent atrial fibrillation ([place])    a. On chronic Coumadin - INRs followed by PCP; b. CHA2DS2VASc = 5.   Popliteal artery embolism, right ([place])    a. 01/2017 in the setting of subRx INR-->s/p embolectomy.     Medications:  Scheduled:   Assessment: 68 yof presenting with cough, congestion and weakness for 5 days. On warfarin PTA for hx Afib and VTE. Last appt on 11/7 regimen was 6 mg daily except 4.5 mg TThSat.   INR today on admission is elevated at 6.8. Hgb 14, plt 196. No reported bleeding.   Goal of Therapy:  INR 2.5-3.5 Monitor platelets by anticoagulation protocol: Yes   Plan:  Hold warfarin tonight Monitor daily INR, CBC, and for s/sx of bleeding   Antonietta Jewel, PharmD, BCCCP Clinical Pharmacist  Phone: [phone] 02/20/2022 10:40 PM  Please check AMION for all [place] phone numbers After 10:00 PM, call [place] [phone]

## 2022-02-20 NOTE — ED Notes (Signed)
Attempted to ambulate patient to monitor spO2 response to this activity. Pt unable to tolerate standing. SpO2 remains 96% or higher while getting to edge of bed and sitting, but this activity causes her to feel nauseated and dizzy. In and out cath performed; while performing peri care and this procedure, noted redness and open excoriations to perineum. Barrier cream applied.

## 2022-02-20 NOTE — ED Provider Notes (Signed)
[facility] EMERGENCY DEPARTMENT Provider Note   CSN: [id number] Arrival date & time: 02/20/22  1536     History  Chief Complaint  Patient presents with  . Weakness    [name] is a 76 y.o. female.  Patient is a 76 year old female with a past medical history of A-fib on Coumadin, hypertension, CHF to the emergency department with cough, congestion and generalized weakness.  The patient states that since last Monday she has had a cough and congestion and has a decreased appetite and has been feeling more generally weak all over.  She states that she has been nauseous but denies any vomiting.  She states she had 1 episode of diarrhea earlier in the week.  She denies any black or bloody stools.  She denies any dysuria or hematuria.  She denies any chest pain but reports she feels mildly short of breath.  The history is provided by the patient and a relative.  Weakness      Home Medications Prior to Admission medications   Medication Sig Start Date End Date Taking? Authorizing Provider  aspirin 81 MG tablet Take 81 mg by mouth daily.      [provider]  Calcium Carb-Cholecalciferol (CALCIUM 600 + D PO) Take 1 tablet by mouth 2 (two) times daily.    [provider]  Cholecalciferol (VITAMIN D) 50 MCG (2000 UT) tablet Take 2,000 Units by mouth daily.    [provider]  ferrous sulfate 325 (65 FE) MG tablet Take 1 tablet (325 mg total) by mouth daily with breakfast. 12/24/20 03/24/21  Amin, Jeanella Flattery, MD  FLUoxetine (PROZAC) 20 MG capsule Take 20 mg by mouth daily. 04/12/17   [provider]  furosemide (LASIX) 40 MG tablet Take 1 tablet by mouth once daily 11/03/20   Lendon Colonel, NP  HYDROcodone-acetaminophen (NORCO/VICODIN) 5-325 MG tablet Take 1 tablet by mouth every 4 (four) hours as needed for moderate pain. 12/23/20   Amin, Ankit Chirag, MD  lidocaine (LIDODERM) 5 % Place 1 patch onto the skin daily. Remove &  Discard patch within 12 hours or as directed by MD 09/19/19   Nuala Alpha A, PA-C  losartan (COZAAR) 100 MG tablet Take 1 tablet (100 mg total) by mouth daily. 05/27/21 08/25/21  Minus Breeding, MD  methocarbamol (ROBAXIN) 500 MG tablet Take 1 tablet (500 mg total) by mouth every 6 (six) hours as needed for muscle spasms. 12/23/20   Damita Lack, MD  metoprolol tartrate (LOPRESSOR) 25 MG tablet Take 1 tablet by mouth twice daily 09/01/21   Minus Breeding, MD  potassium chloride (K-DUR) 10 MEQ tablet TAKE 1 TABLET BY MOUTH ONCE DAILY Patient taking differently: Take 10 mEq by mouth daily. 04/23/18   Erlene Quan, PA-C  senna (SENOKOT) 8.6 MG TABS tablet Take 2 tablets (17.2 mg total) by mouth 2 (two) times daily as needed for mild constipation or moderate constipation. 12/23/20   Amin, Jeanella Flattery, MD  warfarin (COUMADIN) 3 MG tablet TAKE 2 TABLET BY MOUTH DAILY EXCEPT 1 AND 1/2 TABLETS ON TUESDAY,THURSDAY AND SATURDAY OR AS DIRECTED BY COUMADIN CLINIC 10/14/21   Minus Breeding, MD      Allergies    Heparin and Other    Review of Systems   Review of Systems  Neurological:  Positive for weakness.    Physical Exam Updated Vital Signs BP (!) 133/91   Pulse 71   Temp 99.5 F (37.5 C) (Oral)   Resp 20  Ht '5\' 5"'$  (1.651 m)   Wt 121.1 kg   SpO2 96%   BMI 44.43 kg/m  Physical Exam Vitals and nursing note reviewed.  Constitutional:      General: She is not in acute distress.    Appearance: She is obese. She is ill-appearing.  HENT:     Head: Normocephalic and atraumatic.     Nose: Nose normal.     Mouth/Throat:     Mouth: Mucous membranes are moist.     Pharynx: Oropharynx is clear.  Eyes:     Extraocular Movements: Extraocular movements intact.     Conjunctiva/sclera: Conjunctivae normal.  Cardiovascular:     Rate and Rhythm: Tachycardia present. Rhythm irregular.     Pulses: Normal pulses.     Heart sounds: Normal heart sounds.  Pulmonary:     Effort: Pulmonary effort  is normal.     Breath sounds: Rales (throughout) present.  Abdominal:     General: Abdomen is flat.     Palpations: Abdomen is soft.     Tenderness: There is no abdominal tenderness.  Musculoskeletal:        General: Normal range of motion.     Cervical back: Normal range of motion and neck supple.     Right lower leg: No edema.     Left lower leg: No edema.  Skin:    General: Skin is warm and dry.  Neurological:     General: No focal deficit present.     Mental Status: She is alert and oriented to person, place, and time.     Cranial Nerves: No cranial nerve deficit.     Sensory: No sensory deficit.     Motor: No weakness.     Coordination: Coordination normal.  Psychiatric:        Mood and Affect: Mood normal.        Behavior: Behavior normal.     ED Results / Procedures / Treatments   Labs (all labs ordered are listed, but only abnormal results are displayed) Labs Reviewed  CBC WITH DIFFERENTIAL/PLATELET - Abnormal; Notable for the following components:      Result Value   WBC 13.8 (*)    MCV 104.6 (*)    MCH 34.1 (*)    Neutro Abs 11.3 (*)    Monocytes Absolute 1.3 (*)    Abs Immature Granulocytes 0.17 (*)    All other components within normal limits  COMPREHENSIVE METABOLIC PANEL - Abnormal; Notable for the following components:   Potassium 3.3 (*)    Glucose, Bld 183 (*)    BUN 42 (*)    Creatinine, Ser 1.64 (*)    Albumin 2.8 (*)    Total Bilirubin 1.4 (*)    GFR, Estimated 32 (*)    All other components within normal limits  PHOSPHORUS - Abnormal; Notable for the following components:   Phosphorus 2.0 (*)    All other components within normal limits  PROTIME-INR - Abnormal; Notable for the following components:   Prothrombin Time 58.7 (*)    INR 6.8 (*)    All other components within normal limits  RESP PANEL BY RT-PCR (FLU A&B, COVID) ARPGX2  MAGNESIUM  URINALYSIS, ROUTINE W REFLEX MICROSCOPIC    EKG EKG Interpretation  Date/Time:  Sunday  February 20 2022 15:46:14 EST Ventricular Rate:  134 PR Interval:    QRS Duration: 81 QT Interval:  321 QTC Calculation: 480 R Axis:   125 Text Interpretation: Atrial fibrillation Right axis deviation Repolarization  abnormality, prob rate related No significant change since last tracing Confirmed by Leanord Asal ([id number]) on 02/20/2022 4:01:17 PM  Radiology DG Chest 2 View  Result Date: 02/20/2022 CLINICAL DATA:  Shortness of breath, weakness for 5 days, cough EXAM: CHEST - 2 VIEW COMPARISON:  12/18/2020 FINDINGS: Cardiomegaly. Mild, diffuse interstitial pulmonary opacity. Disc degenerative disease of the thoracic spine. IMPRESSION: Cardiomegaly with mild, diffuse interstitial pulmonary opacity, consistent with edema or atypical/viral infection. No focal airspace opacity. Electronically Signed   By: Delanna Ahmadi M.D.   On: 02/20/2022 17:08    Procedures Procedures    Medications Ordered in ED Medications  lactated ringers bolus 1,000 mL (has no administration in time range)  lactated ringers bolus 1,000 mL (0 mLs Intravenous Stopped 02/20/22 1753)  potassium chloride SA (KLOR-CON M) CR tablet 40 mEq (40 mEq Oral Given 02/20/22 1801)  metoprolol tartrate (LOPRESSOR) injection 5 mg (5 mg Intravenous Given 02/20/22 1801)  albuterol (VENTOLIN HFA) 108 (90 Base) MCG/ACT inhaler 2 puff (2 puffs Inhalation Given 02/20/22 1925)  AeroChamber Plus Flo-Vu Large MISC (  Given 02/20/22 1928)    ED Course/ Medical Decision Making/ A&P Clinical Course as of 02/20/22 2114  Sun Feb 20, 2022  1751 HR has remained elevated despite IVF. She will be given dose of lopressor. INR ordered due to coumadin. Amb pulse ox will be performed. [VK]  1857 INR elevated, no active bleeding. [VK]  2045 Attempted to stand to ambulate to the bathroom but began to feel very weak and dizzy.  Patient has not become hypoxic will likely will require admission for continued fluid resuscitation. [VK]  2113 UA positive,  will be treated with ceftriaxone. Patient signed out to hospitalist [VK]    Clinical Course User Index [VK] Kemper Durie, DO                           Medical Decision Making This patient presents to the ED with chief complaint(s) of cough, congestion and generalized weakness with pertinent past medical history of A-fib and prior PEs on Coumadin, hypertension, CHF which further complicates the presenting complaint. The complaint involves an extensive differential diagnosis and also carries with it a high risk of complications and morbidity.    The differential diagnosis includes arrhythmia, pneumonia, pneumothorax, pulmonary edema, pleural effusion, viral syndrome, dehydration, electrolyte abnormality, UTI, arrhythmia, no focal neurologic deficits making stroke unlikely, anemia  Additional history obtained: Additional history obtained from family Records reviewed cardiology INR records  ED Course and Reassessment: Upon patient's arrival to the ED she is awake and alert and ill-appearing but in no acute distress.  She was satting well on room air but was placed on 2 L nasal cannula for comfort.  She will have labs, chest x-ray viral panel and urine performed to evaluate for cause of her symptoms.  She will be given IV fluids for dehydration and her heart rate will be closely reassessed for rate control.  Independent labs interpretation:  The following labs were independently interpreted: mild hypokalemia, AKI, supratherapeutic INR  Independent visualization of imaging: - I independently visualized the following imaging with scope of interpretation limited to determining acute life threatening conditions related to emergency care: CXR, which revealed interstitial pulmonary opacity concerning for viral infection  Consultation: - Consulted or discussed management/test interpretation w/ external professional: hospitalist  Consideration for admission or further workup: She requires  admission for further work-up and management of her dehydration and generalized weakness Social Determinants  of health: N/A    Amount and/or Complexity of Data Reviewed Labs: ordered. Radiology: ordered.  Risk Prescription drug management. Decision regarding hospitalization.          Final Clinical Impression(s) / ED Diagnoses Final diagnoses:  Generalized weakness  Supratherapeutic INR  Gastroenteritis  Hypokalemia  AKI (acute kidney injury) [facility])    Rx / DC Orders ED Discharge Orders          Ordered    Amb referral to AFIB Clinic        02/20/22 [place], [place], DO 02/20/22 2049

## 2022-02-20 NOTE — ED Triage Notes (Signed)
Pt bib [facility] from home where she has been feeling bad with n/v/d and weakness for 5 days. Pt called ems today for dizziness upon standing.  EMS found pt to be in afib rvr at a rate of 130s and hypotensive 90SBP

## 2022-02-20 NOTE — H&P (Signed)
History and Physical    SWAYZE KOZUCH BJY:[id number] DOB: 16-Aug-1945 DOA: 02/20/2022  PCP: Josetta Huddle, MD  Patient coming from: home  I have personally briefly reviewed patient's old medical records in [place]  Chief Complaint: n/v/d weakness /dizziness x 5 days  HPI: [name] is a 76 y.o. female with medical history significant of  Afib, DVT/ b/l PE s/p ivc filter,  Embolic CVA (requiring TPA with sbsequent hemorrhage conversion), Lupus anticoagulant, obesity , HIT, right popliteal artery embolism in setting of subtherapeutic inr  s/p embolectomy who presented to ED BIB ems with complaints of n/v/d/weakness /dizziness x 6 days. IN field patient was found to be in Afib rvr with hr 086V  and systolic bp of 90.Per patient at onset felt as she had a common cold, however symptoms progressed and she become more fatigue , lost her appetite and has episode of n/v, and dizziness. She notes no sore throat no HA or sinus pain, no body aches or joint aches. She also denies chest pain. She notes sob occurs with coughing spells. She notes no increase swelling in lower extremity.   ED Course:  In ED  Vitals: Tmx 99.5, hr 119/71, hr 128-140's, rr 20  sat 99% on [place] EKG noted afib with rvr, patient was given ivfs bolus  and metoprolol IV With hr decreasing to 110's.  Labs; Wbc 13.8, hgb 14, MC 104.6,  pmn 11.3 Na 141, K 3.3, glu 183, cr 1.64 ( 1.08) Phos 2 INR 6.8 Cxr MPRESSION: Cardiomegaly with mild, diffuse interstitial pulmonary opacity, consistent with edema or atypical/viral infection. No focal airspace opacity.  Ua +nitrate,bacteria, wbc 6-10 Respiratory panel: neg  Tx metoprolol '5mg'$  iv x 1, KCL 40 meq, LR 1L, ctx 2gram Review of Systems: As per HPI otherwise 10 point review of systems negative.   Past Medical History:  Diagnosis Date   (HFpEF) heart failure with preserved ejection fraction ([place])    a. 06/2008 Echo: EF 55-60%, mild LVH, triv AI, mild to mod MS, mod  to sev dil LA, mildly dil RA.   Acute right MCA stroke ([place])    a. 10/03/4694 Embolic stroke treated with TPA and thrombectomy with hemorrhagic transformation; Carotids 3/12 negative for ICA stenosis.   Arthritis    Bilateral Pulmonary Emboli    a. 08/2005 - s/p IVC filter.   CHF (congestive heart failure) (HCC)    Depression    Embolus of femoral artery ([place])    a. 06/2008 s/p bilateral embolectomies.   History of DVT (deep vein thrombosis)    a. s/p IVC filter.   HIT (heparin-induced thrombocytopenia) (HCC)    HTN (hypertension)    Lupus anticoagulant disorder (HCC)    Mitral stenosis    moderate-severe 09/04/20 echo   Obesity, unspecified    Permanent atrial fibrillation ([place])    a. On chronic Coumadin - INRs followed by PCP; b. CHA2DS2VASc = 5.   Popliteal artery embolism, right ([place])    a. 01/2017 in the setting of subRx INR-->s/p embolectomy.    Past Surgical History:  Procedure Laterality Date   APPLICATION OF WOUND VAC  12/09/2020   Procedure: APPLICATION OF WOUND VAC;  Surgeon: [name], MD;  Location: [facility];  Service: Orthopedics;;   distal radius fracture. (12,/16/2010)     EMBOLECTOMY Right 02/15/2017   Procedure: EMBOLECTOMY RIGHT LEG;  Surgeon: Elam Dutch, MD;  Location: [facility];  Service: Vascular;  Laterality: Right;   EYE SURGERY Bilateral 2021  cataracts removed   I & D EXTREMITY Right 03/05/2017   Procedure: IRRIGATION AND DEBRIDEMENT RIGHT LEG HEMATOMA EVACUATION;  Surgeon: Elam Dutch, MD;  Location: [facility];  Service: Vascular;  Laterality: Right;   IVC Placement 08/30/2005     LIPOMA EXCISION Left 12/09/2020   Procedure: EXCISION LIPOMA LEFT THIGH;  Surgeon: [name], MD;  Location: [facility];  Service: Orthopedics;  Laterality: Left;   Open reduction and internal fixation of left intra-articular       reports that she has never smoked. She has never used smokeless tobacco. She reports that she does not drink alcohol and does not use  drugs.  Allergies  Allergen Reactions   Heparin Hives    Patient attests severe allergy- rash all over, severe itching, unable to take heparin   Other Itching and Rash    "Sheets and Linens used by [facility] Pontes"    Family History  Problem Relation Age of Onset   Coronary artery disease Other    Diabetes Other     Prior to Admission medications   Medication Sig Start Date End Date Taking? Authorizing Provider  aspirin 81 MG tablet Take 81 mg by mouth daily.      [provider]  Calcium Carb-Cholecalciferol (CALCIUM 600 + D PO) Take 1 tablet by mouth 2 (two) times daily.    [provider]  Cholecalciferol (VITAMIN D) 50 MCG (2000 UT) tablet Take 2,000 Units by mouth daily.    [provider]  ferrous sulfate 325 (65 FE) MG tablet Take 1 tablet (325 mg total) by mouth daily with breakfast. 12/24/20 03/24/21  Amin, Jeanella Flattery, MD  FLUoxetine (PROZAC) 20 MG capsule Take 20 mg by mouth daily. 04/12/17   [provider]  furosemide (LASIX) 40 MG tablet Take 1 tablet by mouth once daily 11/03/20   Lendon Colonel, NP  HYDROcodone-acetaminophen (NORCO/VICODIN) 5-325 MG tablet Take 1 tablet by mouth every 4 (four) hours as needed for moderate pain. 12/23/20   Amin, Ankit Chirag, MD  lidocaine (LIDODERM) 5 % Place 1 patch onto the skin daily. Remove & Discard patch within 12 hours or as directed by MD 09/19/19   Nuala Alpha A, PA-C  losartan (COZAAR) 100 MG tablet Take 1 tablet (100 mg total) by mouth daily. 05/27/21 08/25/21  Minus Breeding, MD  methocarbamol (ROBAXIN) 500 MG tablet Take 1 tablet (500 mg total) by mouth every 6 (six) hours as needed for muscle spasms. 12/23/20   Damita Lack, MD  metoprolol tartrate (LOPRESSOR) 25 MG tablet Take 1 tablet by mouth twice daily 09/01/21   Minus Breeding, MD  potassium chloride (K-DUR) 10 MEQ tablet TAKE 1 TABLET BY MOUTH ONCE DAILY Patient taking differently: Take 10 mEq by mouth daily. 04/23/18   Erlene Quan, PA-C  senna (SENOKOT) 8.6 MG TABS tablet Take 2 tablets (17.2 mg total) by mouth 2 (two) times daily as needed for mild constipation or moderate constipation. 12/23/20   Amin, Jeanella Flattery, MD  warfarin (COUMADIN) 3 MG tablet TAKE 2 TABLET BY MOUTH DAILY EXCEPT 1 AND 1/2 TABLETS ON TUESDAY,THURSDAY AND SATURDAY OR AS DIRECTED BY COUMADIN CLINIC 10/14/21   Minus Breeding, MD    Physical Exam: Vitals:   02/20/22 1915 02/20/22 1930 02/20/22 2000 02/20/22 2104  BP: (!) 130/98 (!) 144/99 (!) 133/91 (!) 133/91  Pulse: (!) 51 (!) 104 71 (!) 113  Resp: '19 19 20 20  '$ Temp:    99.4 F (37.4 C)  TempSrc:      SpO2: 94% 98% 96% 96%  Weight:      Height:        Constitutional: NAD, calm, comfortable Vitals:   02/20/22 1915 02/20/22 1930 02/20/22 2000 02/20/22 2104  BP: (!) 130/98 (!) 144/99 (!) 133/91 (!) 133/91  Pulse: (!) 51 (!) 104 71 (!) 113  Resp: '19 19 20 20  '$ Temp:    99.4 F (37.4 C)  TempSrc:      SpO2: 94% 98% 96% 96%  Weight:      Height:       Eyes: PERRL, lids and conjunctivae normal ENMT: Mucous membranes are moist. Posterior pharynx clear of any exudate or lesions.Normal dentition.  Neck: normal, supple, no masses, no thyromegaly Respiratory: clear to auscultation bilaterally, no wheezing, no crackles. Normal respiratory effort. No accessory muscle use.  Cardiovascular: Regular rate and rhythm, no murmurs / rubs / gallops. No extremity edema. 2+ pedal pulses. No carotid bruits.  Abdomen: no tenderness, no masses palpated. No hepatosplenomegaly. Bowel sounds positive.  Musculoskeletal: no clubbing / cyanosis. No joint deformity upper and lower extremities. Good ROM, no contractures. Normal muscle tone.  Skin: no rashes, lesions, ulcers. No induration Neurologic: CN 2-12 grossly intact. Sensation intact, DTR normal. Strength 5/5 in all 4.  Psychiatric: Normal judgment and insight. Alert and oriented x 3. Normal mood.    Labs on Admission: I have personally reviewed  following labs and imaging studies  CBC: Recent Labs  Lab 02/20/22 1620  WBC 13.8*  NEUTROABS 11.3*  HGB 14.0  HCT 42.9  MCV 104.6*  PLT 893   Basic Metabolic Panel: Recent Labs  Lab 02/20/22 1620  NA 141  K 3.3*  CL 102  CO2 24  GLUCOSE 183*  BUN 42*  CREATININE 1.64*  CALCIUM 9.0  MG 2.2  PHOS 2.0*   GFR: Estimated Creatinine Clearance: 38.1 mL/min (A) (by C-G formula based on SCr of 1.64 mg/dL (H)). Liver Function Tests: Recent Labs  Lab 02/20/22 1620  AST 21  ALT 15  ALKPHOS 74  BILITOT 1.4*  PROT 7.2  ALBUMIN 2.8*   No results for input(s): "LIPASE", "AMYLASE" in the last 168 hours. No results for input(s): "AMMONIA" in the last 168 hours. Coagulation Profile: Recent Labs  Lab 02/20/22 1639  INR 6.8*   Cardiac Enzymes: No results for input(s): "CKTOTAL", "CKMB", "CKMBINDEX", "TROPONINI" in the last 168 hours. BNP (last 3 results) No results for input(s): "PROBNP" in the last 8760 hours. HbA1C: No results for input(s): "HGBA1C" in the last 72 hours. CBG: No results for input(s): "GLUCAP" in the last 168 hours. Lipid Profile: No results for input(s): "CHOL", "HDL", "LDLCALC", "TRIG", "CHOLHDL", "LDLDIRECT" in the last 72 hours. Thyroid Function Tests: No results for input(s): "TSH", "T4TOTAL", "FREET4", "T3FREE", "THYROIDAB" in the last 72 hours. Anemia Panel: No results for input(s): "VITAMINB12", "FOLATE", "FERRITIN", "TIBC", "IRON", "RETICCTPCT" in the last 72 hours. Urine analysis:    Component Value Date/Time   COLORURINE AMBER (A) 02/20/2022 2030   APPEARANCEUR HAZY (A) 02/20/2022 2030   LABSPEC 1.026 02/20/2022 2030   PHURINE 5.0 02/20/2022 2030   GLUCOSEU NEGATIVE 02/20/2022 2030   HGBUR MODERATE (A) 02/20/2022 2030   BILIRUBINUR NEGATIVE 02/20/2022 2030   [place] 02/20/2022 2030   PROTEINUR 100 (A) 02/20/2022 2030   UROBILINOGEN 1.0 07/07/2010 1500   NITRITE POSITIVE (A) 02/20/2022 2030   LEUKOCYTESUR NEGATIVE  02/20/2022 2030    Radiological Exams on Admission: DG Chest 2 View  Result Date:  02/20/2022 CLINICAL DATA:  Shortness of breath, weakness for 5 days, cough EXAM: CHEST - 2 VIEW COMPARISON:  12/18/2020 FINDINGS: Cardiomegaly. Mild, diffuse interstitial pulmonary opacity. Disc degenerative disease of the thoracic spine. IMPRESSION: Cardiomegaly with mild, diffuse interstitial pulmonary opacity, consistent with edema or atypical/viral infection. No focal airspace opacity. Electronically Signed   By: Delanna Ahmadi M.D.   On: 02/20/2022 17:08    EKG: Independently reviewed.see above  Assessment/Plan   CAP w/o hypoxemia associated sepsis -increase wbc, HR, noted infection,  -cxr with opacities ? Infection vs element of fluid  -patient with cough congestion, low grade fever wbc count  - start start on CTX/azithromycin  -f/u on blood/sputum cultures, urinary ag  -pulmonary toiltet   Afib RVR  -insetting of dehydration and infection  -resume home metoprolol  - hr now in 110's with ivfs  - continue with gentle ivfs  -prn metoprolol iv for persistent rvr  -tx underlying infeciton -admit to progressive care   UTI  -abn UA  -f/u with urine culture  - on abx   AKI  -hold nephrotoxic medications  - ivfs  -monitor strict I/o  -insetting of poor po and infection as well as use of  arb  Hypokalemia  -repleted in ED  -monitor labs  -check magnesium level   Hypophosphatemia  -replete  -monitor level   Supra therapeutic INR -hold warfarin  -pharmacy consult to manage  DVT/ b/l PE  -s/p ivc filter -resume coumadin as able   Embolic CVA  -(requiring TPA with sbsequent hemorrhage conversion) -resume coumadin as able   Lupus anticoagulant -no active issue  -no current concerns as patient in on chronic AC  Obesity -nutrition consult    Hx of HIT -avoid heparin  Hx of Rght popliteal artery embolism  -in setting of subtherapeutic inr  s/p embolectomy  DVT prophylaxis:  supra therapeutic inr  Code Status:  Family Communication:  dtr at bedside [name] (Daughter) [phone] (Mobile)  Disposition Plan: patient  expected to be admitted greater than 2 midnights  Consults called: n/a Admission status: progressive care    Clance Boll MD [facility]  If 7PM-7AM, please contact night-coverage [email or web address] Password [facility]  02/20/2022, 9:26 PM

## 2022-02-21 DIAGNOSIS — R791 Abnormal coagulation profile: Secondary | ICD-10-CM

## 2022-02-21 DIAGNOSIS — N179 Acute kidney failure, unspecified: Secondary | ICD-10-CM | POA: Diagnosis not present

## 2022-02-21 DIAGNOSIS — I10 Essential (primary) hypertension: Secondary | ICD-10-CM

## 2022-02-21 DIAGNOSIS — J189 Pneumonia, unspecified organism: Secondary | ICD-10-CM | POA: Diagnosis not present

## 2022-02-21 DIAGNOSIS — Z8673 Personal history of transient ischemic attack (TIA), and cerebral infarction without residual deficits: Secondary | ICD-10-CM

## 2022-02-21 DIAGNOSIS — I482 Chronic atrial fibrillation, unspecified: Secondary | ICD-10-CM | POA: Diagnosis not present

## 2022-02-21 LAB — CBC WITH DIFFERENTIAL/PLATELET
Abs Immature Granulocytes: 0.16 10*3/uL — ABNORMAL HIGH (ref 0.00–0.07)
Basophils Absolute: 0 10*3/uL (ref 0.0–0.1)
Basophils Relative: 0 %
Eosinophils Absolute: 0 10*3/uL (ref 0.0–0.5)
Eosinophils Relative: 0 %
HCT: 35.2 % — ABNORMAL LOW (ref 36.0–46.0)
Hemoglobin: 11.3 g/dL — ABNORMAL LOW (ref 12.0–15.0)
Immature Granulocytes: 1 %
Lymphocytes Relative: 10 %
Lymphs Abs: 1.5 10*3/uL (ref 0.7–4.0)
MCH: 33.7 pg (ref 26.0–34.0)
MCHC: 32.1 g/dL (ref 30.0–36.0)
MCV: 105.1 fL — ABNORMAL HIGH (ref 80.0–100.0)
Monocytes Absolute: 1.8 10*3/uL — ABNORMAL HIGH (ref 0.1–1.0)
Monocytes Relative: 12 %
Neutro Abs: 11.3 10*3/uL — ABNORMAL HIGH (ref 1.7–7.7)
Neutrophils Relative %: 77 %
Platelets: 172 10*3/uL (ref 150–400)
RBC: 3.35 MIL/uL — ABNORMAL LOW (ref 3.87–5.11)
RDW: 13.8 % (ref 11.5–15.5)
WBC: 14.7 10*3/uL — ABNORMAL HIGH (ref 4.0–10.5)
nRBC: 0.3 % — ABNORMAL HIGH (ref 0.0–0.2)

## 2022-02-21 LAB — TROPONIN I (HIGH SENSITIVITY)
Troponin I (High Sensitivity): 56 ng/L — ABNORMAL HIGH (ref <18)
Troponin I (High Sensitivity): 55 ng/L — ABNORMAL HIGH (ref <18)

## 2022-02-21 LAB — HIV ANTIBODY (ROUTINE TESTING W REFLEX): HIV Screen 4th Generation wRfx: NONREACTIVE

## 2022-02-21 LAB — BASIC METABOLIC PANEL
Anion gap: 10 (ref 5–15)
BUN: 33 mg/dL — ABNORMAL HIGH (ref 8–23)
CO2: 25 mmol/L (ref 22–32)
Calcium: 8.3 mg/dL — ABNORMAL LOW (ref 8.9–10.3)
Chloride: 107 mmol/L (ref 98–111)
Creatinine, Ser: 1.13 mg/dL — ABNORMAL HIGH (ref 0.44–1.00)
GFR, Estimated: 50 mL/min — ABNORMAL LOW (ref >60)
Glucose, Bld: 144 mg/dL — ABNORMAL HIGH (ref 70–99)
Potassium: 4.3 mmol/L (ref 3.5–5.1)
Sodium: 142 mmol/L (ref 135–145)

## 2022-02-21 LAB — PROTIME-INR
INR: 5.9 (ref 0.8–1.2)
Prothrombin Time: 52.6 seconds — ABNORMAL HIGH (ref 11.4–15.2)

## 2022-02-21 LAB — BRAIN NATRIURETIC PEPTIDE: B Natriuretic Peptide: 462.3 pg/mL — ABNORMAL HIGH (ref 0.0–100.0)

## 2022-02-21 LAB — PHOSPHORUS: Phosphorus: 3.8 mg/dL (ref 2.5–4.6)

## 2022-02-21 LAB — PROCALCITONIN: Procalcitonin: <0.1 ng/mL

## 2022-02-21 MED ORDER — FLUOXETINE HCL 20 MG PO CAPS
20.0000 mg | ORAL_CAPSULE | Freq: Every day | ORAL | Status: DC
  Administered 2022-02-21 – 2022-02-25 (×5): 20 mg via ORAL
  Filled 2022-02-21 (×5): qty 1

## 2022-02-21 MED ORDER — METOPROLOL TARTRATE 25 MG PO TABS
25.0000 mg | ORAL_TABLET | Freq: Two times a day (BID) | ORAL | Status: DC
  Administered 2022-02-21 – 2022-02-25 (×9): 25 mg via ORAL
  Filled 2022-02-21 (×9): qty 1

## 2022-02-21 NOTE — Progress Notes (Signed)
[facility] PROGRESS NOTE  [name] (DOB: [birth year]) RUE:[id number] PCP: Josetta Huddle, MD  Brief Narrative: BEKAH IGOE is a 76 y.o. 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 In the ED temperature 99.21F, rapid AFib with rates 120s-140s confirmed by ECG, tachypneic at 20/min. Neutrophilic leukocytosis (WBC 13.8k; 11.8k PMNs), macrocytosis with hgb 14, MVC 104.6, hypokalemia (K 3.3) and hypophosphatemia (phos level 2), supratherapeutic INR (6.8). CXR revealed diffuse interstitial opacities without focal airspace infiltrate. UA positive, though the patient denies urinary symptoms. Covid, influenza PCR negative, full respiratory viral panel pending.  HR improved with IVF and metoprolol to 110's. Potassium and phosphorus supplemented  Subjective: Feeling better than last night when interviewed still in the ED this morning. She denies chest pain, +palpitations still but less severe. Feels short of breath and still diffusely weak/fatigued. Ate some breakfast. She denies dysuria, urinary frequency or urgency.   Objective: BP 115/82   Pulse 93   Temp 97.6 F (36.4 C) (Oral)   Resp (!) 23   Ht '5\' 5"'$  (1.651 m)   Wt 121.1 kg   SpO2 100%   BMI 44.43 kg/m   Gen: Tired-appearing female in no distress Pulm: No basilar crackles or wheezes, nonlabored on supplemental oxygen  CV: Irreg irreg, rate in 14'N, +systolic murmur at base and suspected at apex but exam limited by tachypnea, patient positioning. No edema or JVD. GI: Soft, NT, ND, +BS. No suprapubic tenderness. Neuro: Alert and oriented. No new focal deficits. Ext: Warm, no deformities Skin: No rashes, lesions or ulcers  on visualized skin   Assessment & Plan: Sepsis and acute hypoxic respiratory failure due to multifocal/interstitial pneumonia: Suspect viral etiology based on preceding symptoms, negative PCT, though did have neutrophilic leukocytosis.  - Continue IVF - Monitor blood and sputum cultures. Will continue IV antibiotics pending blood culture data. If RVP positive, would be able to stop abx. - Note +UA though the patient does deny any urinary symptoms. Came in purely for respiratory symptoms in addition to constitutional.   Valvular AFib with RVR: Rate control improved.  - Continue metoprolol '25mg'$  po BID and IV prn - With borderline LV systolic function at last echo and rheumatic mod-severe MS, mild MR, will recheck echocardiogram. If LVEF down (was 50% per cardiologist, Dr. Percival Spanish), would have to avoid CCBs.  - Remains appropriate for progressive level of care.  AKI: Improving with IVF, BNP 462. - Will stop IVF and push oral intake now that AKI improved, need to avoid overload (note borderline LVEF in the past and cardiomegaly with interstitial opacities (atypical vs. viral infection vs. interstitial pulmonary edema).  - Hold ARB - Treat sepsis.  Supratherapeutic INR: No bleeding, at high risk of clots if reversed, so allowing to trend down.  - Recheck daily  History of HIT, lupus anticoagulant, DVT, PE:  - Anticoagulation.  Macrocytic anemia:  - Check panel. Drop in hgb suspected to be dilutional.  Hypokalemia: Resolved.   Hypophosphatemia: Resolved with supplementation  Depression: Quiescent - Continue SSRI  History of CVA: No new deficits.  Morbid obesity: Body mass index is 44.43 kg/m.   Patrecia Pour, MD [facility] [email or web address] 02/21/2022, 3:29 PM

## 2022-02-22 ENCOUNTER — Inpatient Hospital Stay (HOSPITAL_COMMUNITY): Payer: Medicare Other

## 2022-02-22 DIAGNOSIS — I4891 Unspecified atrial fibrillation: Secondary | ICD-10-CM

## 2022-02-22 DIAGNOSIS — J189 Pneumonia, unspecified organism: Secondary | ICD-10-CM | POA: Diagnosis not present

## 2022-02-22 DIAGNOSIS — I342 Nonrheumatic mitral (valve) stenosis: Secondary | ICD-10-CM | POA: Diagnosis not present

## 2022-02-22 DIAGNOSIS — I482 Chronic atrial fibrillation, unspecified: Secondary | ICD-10-CM | POA: Diagnosis not present

## 2022-02-22 DIAGNOSIS — R791 Abnormal coagulation profile: Secondary | ICD-10-CM | POA: Diagnosis not present

## 2022-02-22 DIAGNOSIS — N179 Acute kidney failure, unspecified: Secondary | ICD-10-CM | POA: Diagnosis not present

## 2022-02-22 DIAGNOSIS — I340 Nonrheumatic mitral (valve) insufficiency: Secondary | ICD-10-CM

## 2022-02-22 LAB — CBC WITH DIFFERENTIAL/PLATELET
Abs Immature Granulocytes: 0.14 10*3/uL — ABNORMAL HIGH (ref 0.00–0.07)
Basophils Absolute: 0 10*3/uL (ref 0.0–0.1)
Basophils Relative: 0 %
Eosinophils Absolute: 0.2 10*3/uL (ref 0.0–0.5)
Eosinophils Relative: 1 %
HCT: 34 % — ABNORMAL LOW (ref 36.0–46.0)
Hemoglobin: 10.2 g/dL — ABNORMAL LOW (ref 12.0–15.0)
Immature Granulocytes: 1 %
Lymphocytes Relative: 10 %
Lymphs Abs: 1.3 10*3/uL (ref 0.7–4.0)
MCH: 32.7 pg (ref 26.0–34.0)
MCHC: 30 g/dL (ref 30.0–36.0)
MCV: 109 fL — ABNORMAL HIGH (ref 80.0–100.0)
Monocytes Absolute: 1.2 10*3/uL — ABNORMAL HIGH (ref 0.1–1.0)
Monocytes Relative: 10 %
Neutro Abs: 9.9 10*3/uL — ABNORMAL HIGH (ref 1.7–7.7)
Neutrophils Relative %: 78 %
Platelets: 159 10*3/uL (ref 150–400)
RBC: 3.12 MIL/uL — ABNORMAL LOW (ref 3.87–5.11)
RDW: 13.8 % (ref 11.5–15.5)
WBC: 12.7 10*3/uL — ABNORMAL HIGH (ref 4.0–10.5)
nRBC: 0.4 % — ABNORMAL HIGH (ref 0.0–0.2)

## 2022-02-22 LAB — ECHOCARDIOGRAM COMPLETE
AR max vel: 1.04 cm2
AV Area VTI: 1.06 cm2
AV Area mean vel: 0.99 cm2
AV Mean grad: 10.6 mmHg
AV Peak grad: 19.1 mmHg
Ao pk vel: 2.19 m/s
Area-P 1/2: 1.01 cm2
Height: 65 in
MV VTI: 0.74 cm2
S' Lateral: 3 cm
Weight: 4272 oz

## 2022-02-22 LAB — FERRITIN: Ferritin: 146 ng/mL (ref 11–307)

## 2022-02-22 LAB — RETICULOCYTES
Immature Retic Fract: 30.9 % — ABNORMAL HIGH (ref 2.3–15.9)
RBC.: 3.1 MIL/uL — ABNORMAL LOW (ref 3.87–5.11)
Retic Count, Absolute: 91.8 10*3/uL (ref 19.0–186.0)
Retic Ct Pct: 3 % (ref 0.4–3.1)

## 2022-02-22 LAB — RESPIRATORY PANEL BY PCR

## 2022-02-22 LAB — VITAMIN B12: Vitamin B-12: 349 pg/mL (ref 180–914)

## 2022-02-22 LAB — BASIC METABOLIC PANEL
Anion gap: 5 (ref 5–15)
BUN: 20 mg/dL (ref 8–23)
CO2: 30 mmol/L (ref 22–32)
Calcium: 8.3 mg/dL — ABNORMAL LOW (ref 8.9–10.3)
Chloride: 107 mmol/L (ref 98–111)
Creatinine, Ser: 0.96 mg/dL (ref 0.44–1.00)
GFR, Estimated: >60 mL/min (ref >60)
Glucose, Bld: 115 mg/dL — ABNORMAL HIGH (ref 70–99)
Potassium: 4.1 mmol/L (ref 3.5–5.1)
Sodium: 142 mmol/L (ref 135–145)

## 2022-02-22 LAB — IRON AND TIBC
Iron: 38 ug/dL (ref 28–170)
Saturation Ratios: 21 % (ref 10.4–31.8)
TIBC: 183 ug/dL — ABNORMAL LOW (ref 250–450)
UIBC: 145 ug/dL

## 2022-02-22 LAB — PROTIME-INR
INR: 4.9 (ref 0.8–1.2)
Prothrombin Time: 45 seconds — ABNORMAL HIGH (ref 11.4–15.2)

## 2022-02-22 LAB — FOLATE: Folate: 8.9 ng/mL (ref >5.9)

## 2022-02-22 LAB — PHOSPHORUS: Phosphorus: 3.3 mg/dL (ref 2.5–4.6)

## 2022-02-22 MED ORDER — WARFARIN SODIUM 3 MG PO TABS
3.0000 mg | ORAL_TABLET | Freq: Once | ORAL | Status: AC
  Administered 2022-02-22: 3 mg via ORAL
  Filled 2022-02-22: qty 1

## 2022-02-22 NOTE — ED Notes (Signed)
ED TO INPATIENT HANDOFF REPORT  ED Nurse Name and Phone #: Iona Coach Name/Age/Gender [name] 76 y.o. female Room/Bed: 002C/002C  Code Status   Code Status: Full Code  Home/SNF/Other Home Patient oriented to: self, place, time, and situation Is this baseline? Yes   Triage Complete: Triage complete  Chief Complaint UTI (urinary tract infection) [N39.0]  Triage Note Pt bib [facility] from home where she has been feeling bad with n/v/d and weakness for 5 days. Pt called ems today for dizziness upon standing.  EMS found pt to be in afib rvr at a rate of 130s and hypotensive 90SBP   Allergies Allergies  Allergen Reactions   Heparin Hives    Patient attests severe allergy- rash all over, severe itching, unable to take heparin   Other Itching and Rash    "Sheets and Linens used by [facility]" Mainly while getting the heparin    Level of Care/Admitting Diagnosis ED Disposition     ED Disposition  Admit   Condition  --   [place]: [place] [[id number]]  Level of Care: Progressive [102]  Admit to Progressive based on following criteria: CARDIOVASCULAR & THORACIC of moderate stability with acute coronary syndrome symptoms/low risk myocardial infarction/hypertensive urgency/arrhythmias/heart failure potentially compromising stability and stable post cardiovascular intervention patients.  Admit to Progressive based on following criteria: MULTISYSTEM THREATS such as stable sepsis, metabolic/electrolyte imbalance with or without encephalopathy that is responding to early treatment.  May admit patient to [facility] or [facility] if equivalent level of care is available:: No  Covid Evaluation: Confirmed COVID Negative  Diagnosis: UTI (urinary tract infection) [182993]  Admitting Physician: Clance Boll [[id number]]  Attending Physician: Clance Boll [[id number]]  Certification:: I certify this patient will need inpatient services for at  least 2 midnights  Estimated Length of Stay: 3          B Medical/Surgery History Past Medical History:  Diagnosis Date   (HFpEF) heart failure with preserved ejection fraction ([place])    a. 06/2008 Echo: EF 55-60%, mild LVH, triv AI, mild to mod MS, mod to sev dil LA, mildly dil RA.   Acute right MCA stroke ([place])    a. 07/26/256 Embolic stroke treated with TPA and thrombectomy with hemorrhagic transformation; Carotids 3/12 negative for ICA stenosis.   Arthritis    Bilateral Pulmonary Emboli    a. 08/2005 - s/p IVC filter.   CHF (congestive heart failure) (HCC)    Depression    Embolus of femoral artery ([place])    a. 06/2008 s/p bilateral embolectomies.   History of DVT (deep vein thrombosis)    a. s/p IVC filter.   HIT (heparin-induced thrombocytopenia) (HCC)    HTN (hypertension)    Lupus anticoagulant disorder (HCC)    Mitral stenosis    moderate-severe 09/04/20 echo   Obesity, unspecified    Permanent atrial fibrillation ([place])    a. On chronic Coumadin - INRs followed by PCP; b. CHA2DS2VASc = 5.   Popliteal artery embolism, right ([place])    a. 01/2017 in the setting of subRx INR-->s/p embolectomy.   Past Surgical History:  Procedure Laterality Date   APPLICATION OF WOUND VAC  12/09/2020   Procedure: APPLICATION OF WOUND VAC;  Surgeon: [name], MD;  Location: [facility];  Service: Orthopedics;;   distal radius fracture. (12,/16/2010)     EMBOLECTOMY Right 02/15/2017   Procedure: EMBOLECTOMY RIGHT LEG;  Surgeon: Elam Dutch, MD;  Location:  [facility];  Service: Vascular;  Laterality: Right;   EYE SURGERY Bilateral 2021   cataracts removed   I & D EXTREMITY Right 03/05/2017   Procedure: IRRIGATION AND DEBRIDEMENT RIGHT LEG HEMATOMA EVACUATION;  Surgeon: Elam Dutch, MD;  Location: [facility];  Service: Vascular;  Laterality: Right;   IVC Placement 08/30/2005     LIPOMA EXCISION Left 12/09/2020   Procedure: EXCISION LIPOMA LEFT THIGH;  Surgeon: [name], MD;  Location:  [facility];  Service: Orthopedics;  Laterality: Left;   Open reduction and internal fixation of left intra-articular       A IV Location/Drains/Wounds Patient Lines/Drains/Airways Status     Active Line/Drains/Airways     Name Placement date Placement time Site Days   Peripheral IV 02/20/22 20 G Right Antecubital 02/20/22  1551  Antecubital  2   Open Drain 1 Left;Anterior;Lateral Thigh 12/09/20  0924  Thigh  440   Negative Pressure Wound Therapy Leg Right;Upper 03/27/17  --  --  1793   External Urinary Catheter 09/08/20  1425  --  532   Incision (Closed) 12/09/20 Leg Left 12/09/20  0908  -- 440   Pressure Injury 03/24/17 03/24/17  0200  -- 1796   Wound / Incision (Open or Dehisced) 03/24/17 Incision - Open Leg Anterior;Lower;Proximal;Right 03/24/17  1359  Leg  1796            Intake/Output Last 24 hours  Intake/Output Summary (Last 24 hours) at 02/22/2022 0947 Last data filed at 02/22/2022 0256 Gross per 24 hour  Intake 1427.34 ml  Output --  Net 1427.34 ml    Labs/Imaging Results for orders placed or performed during the hospital encounter of 02/20/22 (from the past 48 hour(s))  CBC with Differential     Status: Abnormal   Collection Time: 02/20/22  4:20 PM  Result Value Ref Range   WBC 13.8 (H) 4.0 - 10.5 K/uL   RBC 4.10 3.87 - 5.11 MIL/uL   Hemoglobin 14.0 12.0 - 15.0 g/dL   HCT 42.9 36.0 - 46.0 %   MCV 104.6 (H) 80.0 - 100.0 fL   MCH 34.1 (H) 26.0 - 34.0 pg   MCHC 32.6 30.0 - 36.0 g/dL   RDW 13.5 11.5 - 15.5 %   Platelets 196 150 - 400 K/uL    Comment: REPEATED TO VERIFY   nRBC 0.1 0.0 - 0.2 %   Neutrophils Relative % 83 %   Neutro Abs 11.3 (H) 1.7 - 7.7 K/uL   Lymphocytes Relative 7 %   Lymphs Abs 1.0 0.7 - 4.0 K/uL   Monocytes Relative 9 %   Monocytes Absolute 1.3 (H) 0.1 - 1.0 K/uL   Eosinophils Relative 0 %   Eosinophils Absolute 0.0 0.0 - 0.5 K/uL   Basophils Relative 0 %   Basophils Absolute 0.0 0.0 - 0.1 K/uL   Immature Granulocytes 1 %   Abs  Immature Granulocytes 0.17 (H) 0.00 - 0.07 K/uL    Comment: Performed at [facility], [address], [place], [place] [address]  Comprehensive metabolic panel     Status: Abnormal   Collection Time: 02/20/22  4:20 PM  Result Value Ref Range   Sodium 141 135 - 145 mmol/L   Potassium 3.3 (L) 3.5 - 5.1 mmol/L   Chloride 102 98 - 111 mmol/L   CO2 24 22 - 32 mmol/L   Glucose, Bld 183 (H) 70 - 99 mg/dL    Comment: Glucose reference range applies only to samples taken  after fasting for at least 8 hours.   BUN 42 (H) 8 - 23 mg/dL   Creatinine, Ser 1.64 (H) 0.44 - 1.00 mg/dL   Calcium 9.0 8.9 - 10.3 mg/dL   Total Protein 7.2 6.5 - 8.1 g/dL   Albumin 2.8 (L) 3.5 - 5.0 g/dL   AST 21 15 - 41 U/L   ALT 15 0 - 44 U/L   Alkaline Phosphatase 74 38 - 126 U/L   Total Bilirubin 1.4 (H) 0.3 - 1.2 mg/dL   GFR, Estimated 32 (L) >60 mL/min    Comment: (NOTE) Calculated using the CKD-EPI Creatinine Equation (2021)    Anion gap 15 5 - 15    Comment: Performed at [facility], [place] [address] [place], [place] [address]  Magnesium     Status: None   Collection Time: 02/20/22  4:20 PM  Result Value Ref Range   Magnesium 2.2 1.7 - 2.4 mg/dL    Comment: Performed at [facility] [address] [place], [place] [address]  Phosphorus     Status: Abnormal   Collection Time: 02/20/22  4:20 PM  Result Value Ref Range   Phosphorus 2.0 (L) 2.5 - 4.6 mg/dL    Comment: Performed at [facility] [address] [place], [place] [address]  Protime-INR     Status: Abnormal   Collection Time: 02/20/22  4:39 PM  Result Value Ref Range   Prothrombin Time 58.7 (H) 11.4 - 15.2 seconds   INR 6.8 (HH) 0.8 - 1.2    Comment: CRITICAL RESULT CALLED TO, READ BACK BY AND VERIFIED WITH: REPEATED TO VERIFY SPECIMEN CHECKED FOR CLOTS C. COBB RN 02/20/22 1830 LIN AUNG (NOTE) INR goal varies based on device and disease states. Performed at [facility], [place] [address] [place],   [address]   Resp Panel by RT-PCR (Flu A&B, Covid) Anterior Nasal Swab     Status: None   Collection Time: 02/20/22  4:54 PM   Specimen: Anterior Nasal Swab  Result Value Ref Range   SARS Coronavirus 2 by RT PCR NEGATIVE NEGATIVE    Comment: (NOTE) SARS-CoV-2 target nucleic acids are NOT DETECTED.  The SARS-CoV-2 RNA is generally detectable in upper respiratory specimens during the acute phase of infection. The lowest concentration of SARS-CoV-2 viral copies this assay can detect is 138 copies/mL. A negative result does not preclude SARS-Cov-2 infection and should not be used as the sole basis for treatment or other patient management decisions. A negative result may occur with  improper specimen collection/handling, submission of specimen other than nasopharyngeal swab, presence of viral mutation(s) within the areas targeted by this assay, and inadequate number of viral copies(<138 copies/mL). A negative result must be combined with clinical observations, patient history, and epidemiological information. The expected result is Negative.  Fact Sheet for Patients:  EntrepreneurPulse.com.au  Fact Sheet for Healthcare Providers:  IncredibleEmployment.be  This test is no t yet approved or cleared by the [place] FDA and  has been authorized for detection and/or diagnosis of SARS-CoV-2 by FDA under an Emergency Use Authorization (EUA). This EUA will remain  in effect (meaning this test can be used) for the duration of the COVID-19 declaration under Section 564(b)(1) of the Act, 21 U.S.C.section 360bbb-3(b)(1), unless the authorization is terminated  or revoked sooner.       Influenza A by PCR NEGATIVE NEGATIVE   Influenza B by PCR NEGATIVE NEGATIVE    Comment: (NOTE) The Xpert Xpress  SARS-CoV-2/FLU/RSV plus assay is intended as an aid in the diagnosis of influenza from Nasopharyngeal swab specimens and should not be used as a sole basis  for treatment. Nasal washings and aspirates are unacceptable for Xpert Xpress SARS-CoV-2/FLU/RSV testing.  Fact Sheet for Patients: EntrepreneurPulse.com.au  Fact Sheet for Healthcare Providers: IncredibleEmployment.be  This test is not yet approved or cleared by the [place] FDA and has been authorized for detection and/or diagnosis of SARS-CoV-2 by FDA under an Emergency Use Authorization (EUA). This EUA will remain in effect (meaning this test can be used) for the duration of the COVID-19 declaration under Section 564(b)(1) of the Act, 21 U.S.C. section 360bbb-3(b)(1), unless the authorization is terminated or revoked.  Performed at [facility], [place] [address] [place], [place] [address]   Urinalysis, Routine w reflex microscopic Urine, In & Out Cath     Status: Abnormal   Collection Time: 02/20/22  8:30 PM  Result Value Ref Range   Color, Urine AMBER (A) YELLOW    Comment: BIOCHEMICALS MAY BE AFFECTED BY COLOR   APPearance HAZY (A) CLEAR   Specific Gravity, Urine 1.026 1.005 - 1.030   pH 5.0 5.0 - 8.0   Glucose, UA NEGATIVE NEGATIVE mg/dL   Hgb urine dipstick MODERATE (A) NEGATIVE   Bilirubin Urine NEGATIVE NEGATIVE   Ketones, ur NEGATIVE NEGATIVE mg/dL   Protein, ur 100 (A) NEGATIVE mg/dL   Nitrite POSITIVE (A) NEGATIVE   Leukocytes,Ua NEGATIVE NEGATIVE   RBC / HPF 6-10 0 - 5 RBC/hpf   WBC, UA 6-10 0 - 5 WBC/hpf   Bacteria, UA MANY (A) NONE SEEN   Squamous Epithelial / LPF 0-5 0 - 5   Mucus PRESENT    Hyaline Casts, UA PRESENT     Comment: Performed at [facility], [address], [place], [place] [address]  Troponin I (High Sensitivity)     Status: Abnormal   Collection Time: 02/20/22 11:11 PM  Result Value Ref Range   Troponin I (High Sensitivity) 56 (H) <18 ng/L    Comment: (NOTE) Elevated high sensitivity troponin I (hsTnI) values and significant  changes across serial measurements may suggest ACS but many  other  chronic and acute conditions are known to elevate hsTnI results.  Refer to the "Links" section for chest pain algorithms and additional  guidance. Performed at [facility], [place] [address] [place], [place] [address]   Procalcitonin - Baseline     Status: None   Collection Time: 02/20/22 11:11 PM  Result Value Ref Range   Procalcitonin <0.10 ng/mL    Comment:        Interpretation: PCT (Procalcitonin) <= 0.5 ng/mL: Systemic infection (sepsis) is not likely. Local bacterial infection is possible. (NOTE)       Sepsis PCT Algorithm           Lower Respiratory Tract                                      Infection PCT Algorithm    ----------------------------     ----------------------------         PCT < 0.25 ng/mL                PCT < 0.10 ng/mL          Strongly encourage             Strongly discourage  discontinuation of antibiotics    initiation of antibiotics    ----------------------------     -----------------------------       PCT 0.25 - 0.50 ng/mL            PCT 0.10 - 0.25 ng/mL               OR       >80% decrease in PCT            Discourage initiation of                                            antibiotics      Encourage discontinuation           of antibiotics    ----------------------------     -----------------------------         PCT >= 0.50 ng/mL              PCT 0.26 - 0.50 ng/mL               AND        <80% decrease in PCT             Encourage initiation of                                             antibiotics       Encourage continuation           of antibiotics    ----------------------------     -----------------------------        PCT >= 0.50 ng/mL                  PCT > 0.50 ng/mL               AND         increase in PCT                  Strongly encourage                                      initiation of antibiotics    Strongly encourage escalation           of antibiotics                                      -----------------------------                                           PCT <= 0.25 ng/mL                                                 OR                                        >  80% decrease in PCT                                      Discontinue / Do not initiate                                             antibiotics  Performed at [facility], [place] [address], [place], [place] [address]   HIV Antibody (routine testing w rflx)     Status: None   Collection Time: 02/20/22 11:11 PM  Result Value Ref Range   HIV Screen 4th Generation wRfx Non Reactive Non Reactive    Comment: Performed at [facility], [place] [address] [place], [place] [address]  Brain natriuretic peptide     Status: Abnormal   Collection Time: 02/20/22 11:11 PM  Result Value Ref Range   B Natriuretic Peptide 462.3 (H) 0.0 - 100.0 pg/mL    Comment: Performed at [facility] [address], [place], [place] [address]  Troponin I (High Sensitivity)     Status: Abnormal   Collection Time: 02/21/22  1:30 AM  Result Value Ref Range   Troponin I (High Sensitivity) 55 (H) <18 ng/L    Comment: (NOTE) Elevated high sensitivity troponin I (hsTnI) values and significant  changes across serial measurements may suggest ACS but many other  chronic and acute conditions are known to elevate hsTnI results.  Refer to the "Links" section for chest pain algorithms and additional  guidance. Performed at [facility], [place] [address], [place], [place] [address]   CBC with Differential/Platelet     Status: Abnormal   Collection Time: 02/21/22  7:00 AM  Result Value Ref Range   WBC 14.7 (H) 4.0 - 10.5 K/uL   RBC 3.35 (L) 3.87 - 5.11 MIL/uL   Hemoglobin 11.3 (L) 12.0 - 15.0 g/dL   HCT 35.2 (L) 36.0 - 46.0 %   MCV 105.1 (H) 80.0 - 100.0 fL   MCH 33.7 26.0 - 34.0 pg   MCHC 32.1 30.0 - 36.0 g/dL   RDW 13.8 11.5 - 15.5 %   Platelets 172 150 - 400 K/uL    Comment: REPEATED TO VERIFY   nRBC 0.3 (H) 0.0 - 0.2  %   Neutrophils Relative % 77 %   Neutro Abs 11.3 (H) 1.7 - 7.7 K/uL   Lymphocytes Relative 10 %   Lymphs Abs 1.5 0.7 - 4.0 K/uL   Monocytes Relative 12 %   Monocytes Absolute 1.8 (H) 0.1 - 1.0 K/uL   Eosinophils Relative 0 %   Eosinophils Absolute 0.0 0.0 - 0.5 K/uL   Basophils Relative 0 %   Basophils Absolute 0.0 0.0 - 0.1 K/uL   Immature Granulocytes 1 %   Abs Immature Granulocytes 0.16 (H) 0.00 - 0.07 K/uL    Comment: Performed at [facility] [address] [place], [place] [address]  Basic metabolic panel     Status: Abnormal   Collection Time: 02/21/22  7:00 AM  Result Value Ref Range   Sodium 142 135 - 145 mmol/L   Potassium 4.3 3.5 - 5.1 mmol/L   Chloride 107 98 - 111 mmol/L   CO2 25 22 - 32 mmol/L   Glucose, Bld 144 (H) 70 - 99  mg/dL    Comment: Glucose reference range applies only to samples taken after fasting for at least 8 hours.   BUN 33 (H) 8 - 23 mg/dL   Creatinine, Ser 1.13 (H) 0.44 - 1.00 mg/dL   Calcium 8.3 (L) 8.9 - 10.3 mg/dL   GFR, Estimated 50 (L) >60 mL/min    Comment: (NOTE) Calculated using the CKD-EPI Creatinine Equation (2021)    Anion gap 10 5 - 15    Comment: Performed at [facility] [address] [place], [place] [address]  Phosphorus     Status: None   Collection Time: 02/21/22  7:00 AM  Result Value Ref Range   Phosphorus 3.8 2.5 - 4.6 mg/dL    Comment: Performed at [facility] [address] [place], [place] [address]  Protime-INR     Status: Abnormal   Collection Time: 02/21/22  3:40 PM  Result Value Ref Range   Prothrombin Time 52.6 (H) 11.4 - 15.2 seconds   INR 5.9 (HH) 0.8 - 1.2    Comment: REPEATED TO VERIFY CRITICAL RESULT CALLED TO, READ BACK BY AND VERIFIED WITH: K.SWORD,RN '@1630'$  02/21/2022 VANG.J (NOTE) INR goal varies based on device and disease states. Performed at [facility], [place] [address], [place], [place] [address]   Protime-INR     Status: Abnormal   Collection Time: 02/22/22   7:42 AM  Result Value Ref Range   Prothrombin Time 45.0 (H) 11.4 - 15.2 seconds   INR 4.9 (HH) 0.8 - 1.2    Comment: REPEATED TO VERIFY CRITICAL RESULT CALLED TO, READ BACK BY AND VERIFIED WITH: Shon Hale, RN [phone] 02/22/2022 BY MACEDA,J. (NOTE) INR goal varies based on device and disease states. Performed at [facility], [place] [address], [place], [place] [address]   CBC with Differential/Platelet     Status: Abnormal   Collection Time: 02/22/22  7:42 AM  Result Value Ref Range   WBC 12.7 (H) 4.0 - 10.5 K/uL   RBC 3.12 (L) 3.87 - 5.11 MIL/uL   Hemoglobin 10.2 (L) 12.0 - 15.0 g/dL   HCT 34.0 (L) 36.0 - 46.0 %   MCV 109.0 (H) 80.0 - 100.0 fL   MCH 32.7 26.0 - 34.0 pg   MCHC 30.0 30.0 - 36.0 g/dL   RDW 13.8 11.5 - 15.5 %   Platelets 159 150 - 400 K/uL   nRBC 0.4 (H) 0.0 - 0.2 %   Neutrophils Relative % 78 %   Neutro Abs 9.9 (H) 1.7 - 7.7 K/uL   Lymphocytes Relative 10 %   Lymphs Abs 1.3 0.7 - 4.0 K/uL   Monocytes Relative 10 %   Monocytes Absolute 1.2 (H) 0.1 - 1.0 K/uL   Eosinophils Relative 1 %   Eosinophils Absolute 0.2 0.0 - 0.5 K/uL   Basophils Relative 0 %   Basophils Absolute 0.0 0.0 - 0.1 K/uL   Immature Granulocytes 1 %   Abs Immature Granulocytes 0.14 (H) 0.00 - 0.07 K/uL    Comment: Performed at [facility] [address] [place], [place] [address]  Basic metabolic panel     Status: Abnormal   Collection Time: 02/22/22  7:42 AM  Result Value Ref Range   Sodium 142 135 - 145 mmol/L   Potassium 4.1 3.5 - 5.1 mmol/L   Chloride 107 98 - 111 mmol/L   CO2 30 22 - 32 mmol/L   Glucose, Bld 115 (H) 70 - 99 mg/dL  Comment: Glucose reference range applies only to samples taken after fasting for at least 8 hours.   BUN 20 8 - 23 mg/dL   Creatinine, Ser 0.96 0.44 - 1.00 mg/dL   Calcium 8.3 (L) 8.9 - 10.3 mg/dL   GFR, Estimated >60 >60 mL/min    Comment: (NOTE) Calculated using the CKD-EPI Creatinine Equation (2021)    Anion gap 5 5 - 15    Comment:  Performed at [facility] [address] [place], [place] [address]  Phosphorus     Status: None   Collection Time: 02/22/22  7:42 AM  Result Value Ref Range   Phosphorus 3.3 2.5 - 4.6 mg/dL    Comment: Performed at [facility] [address], [place], [place] [address]  Vitamin B12     Status: None   Collection Time: 02/22/22  7:42 AM  Result Value Ref Range   Vitamin B-12 349 180 - 914 pg/mL    Comment: (NOTE) This assay is not validated for testing neonatal or myeloproliferative syndrome specimens for Vitamin B12 levels. Performed at [facility], [place] [address] [place], [place] [address]   Folate     Status: None   Collection Time: 02/22/22  7:42 AM  Result Value Ref Range   Folate 8.9 >5.9 ng/mL    Comment: Performed at [facility] [address], [place], [place] [address]  Iron and TIBC     Status: Abnormal   Collection Time: 02/22/22  7:42 AM  Result Value Ref Range   Iron 38 28 - 170 ug/dL   TIBC 183 (L) 250 - 450 ug/dL   Saturation Ratios 21 10.4 - 31.8 %   UIBC 145 ug/dL    Comment: Performed at [facility], [place] [address] [place], [place] [address]  Ferritin     Status: None   Collection Time: 02/22/22  7:42 AM  Result Value Ref Range   Ferritin 146 11 - 307 ng/mL    Comment: Performed at [facility], [place] [address] [place], [place] [address]  Reticulocytes     Status: Abnormal   Collection Time: 02/22/22  7:42 AM  Result Value Ref Range   Retic Ct Pct 3.0 0.4 - 3.1 %   RBC. 3.10 (L) 3.87 - 5.11 MIL/uL   Retic Count, Absolute 91.8 19.0 - 186.0 K/uL   Immature Retic Fract 30.9 (H) 2.3 - 15.9 %    Comment: Performed at [facility] [address], [place],  [address]   DG Chest 2 View  Result Date: 02/20/2022 CLINICAL DATA:  Shortness of breath, weakness for 5 days, cough EXAM: CHEST - 2 VIEW COMPARISON:  12/18/2020 FINDINGS: Cardiomegaly. Mild, diffuse interstitial pulmonary opacity. Disc  degenerative disease of the thoracic spine. IMPRESSION: Cardiomegaly with mild, diffuse interstitial pulmonary opacity, consistent with edema or atypical/viral infection. No focal airspace opacity. Electronically Signed   By: Delanna Ahmadi M.D.   On: 02/20/2022 17:08    Pending Labs Unresulted Labs (From admission, onward)     Start     Ordered   02/22/22 0843  Respiratory (~20 pathogens) panel by PCR  (Respiratory panel by PCR (~20 pathogens, ~24 hr TAT)  w precautions)  ONCE - STAT,   STAT        02/22/22 0842   02/22/22 0627  Respiratory (~20 pathogens) panel by PCR  (Respiratory panel by PCR (~20 pathogens, ~24 hr TAT)  w precautions)  ONCE - STAT,   STAT        02/22/22 0626   02/21/22 0500  Protime-INR  Daily at 5am,   R      02/20/22 2242   02/20/22 2227  Legionella Pneumophila Serogp 1 Ur Ag  Once,   R        02/20/22 2227   02/20/22 2227  Strep pneumoniae urinary antigen  Once,   R        02/20/22 2227   02/20/22 2224  Urine Culture  (Urine Culture)  Once,   R       Question:  Indication  Answer:  Dysuria   02/20/22 2224            Vitals/Pain Today's Vitals   02/22/22 0740 02/22/22 0745 02/22/22 0800 02/22/22 0845  BP: 129/66  124/69 101/73  Pulse: 80 80 71 100  Resp: (!) 26 18 (!) 24 14  Temp:      TempSrc:      SpO2: 100% 100% 100% 100%  Weight:      Height:      PainSc:        Isolation Precautions Droplet precaution  Medications Medications  metoprolol tartrate (LOPRESSOR) injection 5 mg (5 mg Intravenous Given 02/21/22 0622)  cefTRIAXone (ROCEPHIN) 2 g in sodium chloride 0.9 % 100 mL IVPB (0 g Intravenous Stopped 02/21/22 2050)  azithromycin (ZITHROMAX) 500 mg in sodium chloride 0.9 % 250 mL IVPB (0 mg Intravenous Stopped 02/22/22 0036)  Warfarin - Pharmacist Dosing Inpatient ( Does not apply Not Given 02/21/22 1643)  FLUoxetine (PROZAC) capsule 20 mg (20 mg Oral Given 02/21/22 1216)  metoprolol tartrate (LOPRESSOR) tablet 25 mg (25 mg Oral Given  02/21/22 2120)  warfarin (COUMADIN) tablet 3 mg (has no administration in time range)  lactated ringers bolus 1,000 mL (0 mLs Intravenous Stopped 02/20/22 1753)  potassium chloride SA (KLOR-CON M) CR tablet 40 mEq (40 mEq Oral Given 02/20/22 1801)  metoprolol tartrate (LOPRESSOR) injection 5 mg (5 mg Intravenous Given 02/20/22 1801)  albuterol (VENTOLIN HFA) 108 (90 Base) MCG/ACT inhaler 2 puff (2 puffs Inhalation Given 02/20/22 1925)  AeroChamber Plus Flo-Vu Large MISC (  Given 02/20/22 1928)  lactated ringers bolus 1,000 mL (0 mLs Intravenous Stopped 02/21/22 0129)  cefTRIAXone (ROCEPHIN) 2 g in sodium chloride 0.9 % 100 mL IVPB (0 g Intravenous Stopped 02/20/22 2238)  potassium PHOSPHATE 15 mmol in dextrose 5 % 250 mL infusion (0 mmol Intravenous Stopped 02/21/22 0705)    Mobility walks with device Moderate fall risk   Focused Assessments    R Recommendations: See Admitting Provider Note  Report given to:   Additional Notes:

## 2022-02-22 NOTE — Care Management (Signed)
  Transition of Care [facility]) Screening Note   Patient Details  Name: [name] Date of Birth: 1945/06/04   Transition of Care [facility]) CM/SW Contact:    Carles Collet, RN Phone Number: 02/22/2022, 11:10 AM    Transition of Care Department [facility]) has reviewed patient and is following patient advancement through interdisciplinary progression rounds. If new patient transition needs arise, please place a TOC consult.  Admitted from home, PT OT evals pending.

## 2022-02-22 NOTE — Progress Notes (Signed)
[place] for warfarin Indication: atrial fibrillation  Allergies  Allergen Reactions   Heparin Hives    Patient attests severe allergy- rash all over, severe itching, unable to take heparin   Other Itching and Rash    "Sheets and Linens used by [facility] Pontes" Mainly while getting the heparin    Patient Measurements: Height: '5\' 5"'$  (165.1 cm) Weight: 121.1 kg (267 lb) IBW/kg (Calculated) : 57 Heparin Dosing Weight: 86.2 kg   Vital Signs: Temp: 97.7 F (36.5 C) (11/28 0738) Temp Source: Oral (11/28 0738) BP: 101/73 (11/28 0845) Pulse Rate: 100 (11/28 0845)  Labs: Recent Labs    02/20/22 1620 02/20/22 1639 02/20/22 2311 02/21/22 0130 02/21/22 0700 02/21/22 1540 02/22/22 0742  HGB 14.0  --   --   --  11.3*  --  10.2*  HCT 42.9  --   --   --  35.2*  --  34.0*  PLT 196  --   --   --  172  --  159  LABPROT  --  58.7*  --   --   --  52.6* 45.0*  INR  --  6.8*  --   --   --  5.9* 4.9*  CREATININE 1.64*  --   --   --  1.13*  --  0.96  TROPONINIHS  --   --  56* 55*  --   --   --      Estimated Creatinine Clearance: 65 mL/min (by C-G formula based on SCr of 0.96 mg/dL).   Medical History: Past Medical History:  Diagnosis Date   (HFpEF) heart failure with preserved ejection fraction ([place])    a. 06/2008 Echo: EF 55-60%, mild LVH, triv AI, mild to mod MS, mod to sev dil LA, mildly dil RA.   Acute right MCA stroke ([place])    a. 08/02/8500 Embolic stroke treated with TPA and thrombectomy with hemorrhagic transformation; Carotids 3/12 negative for ICA stenosis.   Arthritis    Bilateral Pulmonary Emboli    a. 08/2005 - s/p IVC filter.   CHF (congestive heart failure) (HCC)    Depression    Embolus of femoral artery ([place])    a. 06/2008 s/p bilateral embolectomies.   History of DVT (deep vein thrombosis)    a. s/p IVC filter.   HIT (heparin-induced thrombocytopenia) (HCC)    HTN (hypertension)    Lupus anticoagulant disorder (HCC)    Mitral  stenosis    moderate-severe 09/04/20 echo   Obesity, unspecified    Permanent atrial fibrillation ([place])    a. On chronic Coumadin - INRs followed by PCP; b. CHA2DS2VASc = 5.   Popliteal artery embolism, right ([place])    a. 01/2017 in the setting of subRx INR-->s/p embolectomy.    Medications:  Scheduled:   FLUoxetine  20 mg Oral Daily   metoprolol tartrate  25 mg Oral BID   Warfarin - Pharmacist Dosing Inpatient   Does not apply q1600    Assessment: 4 yof presenting with cough, congestion and weakness for 5 days. On warfarin PTA for hx Afib and VTE. Last appt on 11/7 regimen was 6 mg daily except 4.5 mg TThSat.   INR supratherapeutic, drifting down to 4.9 s/p doses held and admission INR of 6.8, will start lower dose today considering sig INR downtrend and 2 doses held  Goal of Therapy:  INR 2.5-3.5 Monitor platelets by anticoagulation protocol: Yes   Plan:  Warfarin 3 mg PO x 1 today  Daily INR, s/s bleeding  Bertis Ruddy, PharmD Clinical Pharmacist ED Pharmacist Phone # [phone] 02/22/2022 9:15 AM

## 2022-02-22 NOTE — Progress Notes (Signed)
[facility] PROGRESS NOTE  FANCHON PAPANIA (DOB: 12/14/1945) RDE:[id number] PCP: Josetta Huddle, MD  Brief Narrative: [name] is a 76 y.o. 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 In the ED temperature 99.73F, rapid AFib with rates 120s-140s confirmed by ECG, tachypneic at 20/min. Neutrophilic leukocytosis (WBC 13.8k; 11.8k PMNs), macrocytosis with hgb 14, MVC 104.6, hypokalemia (K 3.3) and hypophosphatemia (phos level 2), supratherapeutic INR (6.8). CXR revealed diffuse interstitial opacities without focal airspace infiltrate. UA positive, though the patient denies urinary symptoms. Covid, influenza PCR negative, full respiratory viral panel pending.  HR improved with IVF and metoprolol to 110's. Potassium and phosphorus supplemented  Subjective: Feeling better than last night when interviewed still in the ED this morning. She denies chest pain, +palpitations still but less severe. Feels short of breath and still diffusely weak/fatigued. Ate some breakfast. She denies dysuria, urinary frequency or urgency.   Objective: BP (!) 108/91   Pulse 86   Temp 97.7 F (36.5 C) (Oral)   Resp 19   Ht '5\' 5"'$  (1.651 m)   Wt 121.1 kg   SpO2 98%   BMI 44.43 kg/m   Gen: Pleasant older female in no distress Pulm: Clear anterolaterally, nonlabored on supplemental oxygen  CV: Irreg, stable murmurs, no worsening edema.  GI: Soft, NT, ND, +BS  Neuro: Alert and oriented. No new focal deficits. Ext: Warm, no deformities Skin: No rashes, lesions or ulcers on visualized skin   Assessment & Plan: Sepsis and acute hypoxic respiratory failure due to multifocal/interstitial pneumonia:  Suspect viral etiology based on preceding symptoms, negative PCT, though did have neutrophilic leukocytosis.  - Does not appear blood and sputum cultures were sent. WBC improving. Continue empiric antibiotics. If RVP positive, would be able to stop abx. - Will recheck CXR in AM if remains hypoxemic. - Note +UA though the patient does deny any urinary symptoms. Came in purely for respiratory symptoms in addition to constitutional. Culture growing GNRs.  - Encourage incentive spirometry, OOB, PT/OT consulted.  Valvular AFib with RVR: Rate remains controlled for the most part. - Continue metoprolol '25mg'$  po BID and IV prn - With borderline LV systolic function at last echo and rheumatic mod-severe MS, mild MR, will recheck echocardiogram. If LVEF down (was 50% per cardiologist, Dr. Percival Spanish), would have to avoid CCBs.   AKI: Improved with IVF and then improved further after fluids stopped, BNP 462. - Holding ARB - Treat sepsis.  Supratherapeutic INR: No bleeding, at high risk of clots if reversed, so allowing to trend down.  - Continues slow trend downward, 4.9 this AM. Recheck daily  History of HIT, lupus anticoagulant, DVT, PE:  - Anticoagulation.  Macrocytic anemia: U31, folic acid, iron panel largely wnl. Isolated low TIBC of uncertain significance. - Drop in hgb suspected to be dilutional, no bleeding noted. Will continue monitoring.  Hypokalemia: Resolved.   Hypophosphatemia: Resolved with supplementation  Depression: Quiescent - Continue SSRI  History of CVA: No new deficits.  Morbid obesity: Body mass index is 44.43 kg/m.   Patrecia Pour, MD [facility] [email or web address] 02/22/2022, 12:15 PM

## 2022-02-22 NOTE — Progress Notes (Signed)
  Echocardiogram 2D Echocardiogram has been performed.  [name] 02/22/2022, 5:14 PM

## 2022-02-23 ENCOUNTER — Inpatient Hospital Stay (HOSPITAL_COMMUNITY): Payer: Medicare Other

## 2022-02-23 ENCOUNTER — Ambulatory Visit: Payer: Medicare Other

## 2022-02-23 DIAGNOSIS — N3000 Acute cystitis without hematuria: Secondary | ICD-10-CM

## 2022-02-23 DIAGNOSIS — J121 Respiratory syncytial virus pneumonia: Secondary | ICD-10-CM

## 2022-02-23 DIAGNOSIS — N179 Acute kidney failure, unspecified: Secondary | ICD-10-CM | POA: Diagnosis not present

## 2022-02-23 LAB — PROTIME-INR
INR: 3.9 — ABNORMAL HIGH (ref 0.8–1.2)
Prothrombin Time: 38.2 seconds — ABNORMAL HIGH (ref 11.4–15.2)

## 2022-02-23 LAB — URINE CULTURE: Culture: >=100000 — AB

## 2022-02-23 LAB — CBC
HCT: 31 % — ABNORMAL LOW (ref 36.0–46.0)
Hemoglobin: 9.7 g/dL — ABNORMAL LOW (ref 12.0–15.0)
MCH: 33.6 pg (ref 26.0–34.0)
MCHC: 31.3 g/dL (ref 30.0–36.0)
MCV: 107.3 fL — ABNORMAL HIGH (ref 80.0–100.0)
Platelets: 163 10*3/uL (ref 150–400)
RBC: 2.89 MIL/uL — ABNORMAL LOW (ref 3.87–5.11)
RDW: 13.8 % (ref 11.5–15.5)
WBC: 12 10*3/uL — ABNORMAL HIGH (ref 4.0–10.5)
nRBC: 0.3 % — ABNORMAL HIGH (ref 0.0–0.2)

## 2022-02-23 LAB — BASIC METABOLIC PANEL
Anion gap: 6 (ref 5–15)
BUN: 14 mg/dL (ref 8–23)
CO2: 32 mmol/L (ref 22–32)
Calcium: 8.3 mg/dL — ABNORMAL LOW (ref 8.9–10.3)
Chloride: 106 mmol/L (ref 98–111)
Creatinine, Ser: 0.85 mg/dL (ref 0.44–1.00)
GFR, Estimated: >60 mL/min (ref >60)
Glucose, Bld: 107 mg/dL — ABNORMAL HIGH (ref 70–99)
Potassium: 3.9 mmol/L (ref 3.5–5.1)
Sodium: 144 mmol/L (ref 135–145)

## 2022-02-23 MED ORDER — WARFARIN SODIUM 4 MG PO TABS
4.5000 mg | ORAL_TABLET | Freq: Once | ORAL | Status: AC
  Administered 2022-02-23: 4.5 mg via ORAL
  Filled 2022-02-23: qty 1

## 2022-02-23 NOTE — Evaluation (Signed)
Physical Therapy Evaluation Patient Details Name: [name] MRN: [id number] DOB: 1945-06-09 Today's Date: 02/23/2022  History of Present Illness  76 y.o. female presents 11/26 with nausea, vomiting, diarrhea, weakness, and dizziness for 6 days. CXR revealed diffuse interstitial opacities without focal airspace infiltrate. UA positive. PMH includes atrial fibrillation, HIT, R popliteal artery embolism, lupus anticoagulant, history of PE and DVT.      Clinical Impression  [name] is 76 y.o. female admitted with above HPI and diagnosis. Patient is currently limited by functional impairments below (see PT problem list). Patient lives alone and is independent at baseline. Pt was able to complete transfers and gait with min-mod assist for safety and due to low surfaces. From elevated surface pt requires Min assist only. Pt ambulated around room with assist to guide RW position during turn. Pt reports family is close by and able to assist her as needed and all of her furniture is elevated (toilet, bed, lift chair). Patient will benefit from continued skilled PT interventions to address impairments and progress independence with mobility, recommending HHPT with frequent assist from family. Acute PT will follow and progress as able.        Recommendations for follow up therapy are one component of a multi-disciplinary discharge planning process, led by the attending physician.  Recommendations may be updated based on patient status, additional functional criteria and insurance authorization.  PT Recommendation   Follow Up Recommendations Home health PT Filed 02/23/2022 8182  Assistance recommended at discharge Frequent or constant Supervision/Assistance Filed 02/23/2022 9937  Patient can return home with the following A lot of help with walking and/or transfers, A lot of help with bathing/dressing/bathroom, Assistance with cooking/housework, Direct supervision/assist for medications  management, Assist for transportation, Help with stairs or ramp for entrance [facility] 02/23/2022 0937  Functional Status Assessment Patient has had a recent decline in their functional status and demonstrates the ability to make significant improvements in function in a reasonable and predictable amount of time. Filed 02/23/2022 0937  PT equipment None recommended by PT [facility] 02/23/2022 0937   Precautions / Restrictions Precautions Precautions: Fall Restrictions Weight Bearing Restrictions: No      Mobility  Bed Mobility Overal bed mobility: Needs Assistance Bed Mobility: Supine to Sit     Supine to sit: Mod assist     General bed mobility comments: Cues to use bed rail and pt able to initiate reach and pull up to raise trunk. Mod assist with use of bed pad to pivot hips and scoot to EOB.    Transfers Overall transfer level: Needs assistance Equipment used: Rolling walker (2 wheels) Transfers: Sit to/from Stand Sit to Stand: Min assist, Mod assist, From elevated surface           General transfer comment: pt unable to stand from low EOB, min assist to rise from elevated EOB. Mod assist to stand from recliner with cues to use bil UE's on armrests for power up. Pt tends to drop down to sit with cues needed to reach back for controlled lowering.    Ambulation/Gait Ambulation/Gait assistance: Min assist Gait Distance (Feet): 15 Feet Assistive device: Rolling walker (2 wheels) Gait Pattern/deviations: Step-through pattern, Decreased step length - left, Decreased step length - right, Decreased stride length, Trunk flexed Gait velocity: decr     General Gait Details: Cues for position to RW and min assist to steady balance with turn to ambulate short bout in room and turn to walk to recliner.  Stairs  Wheelchair Mobility    Modified Rankin (Stroke Patients Only)       Balance Overall balance assessment: Needs assistance Sitting-balance support: Feet  supported Sitting balance-Leahy Scale: Fair     Standing balance support: Bilateral upper extremity supported, During functional activity, Reliant on assistive device for balance Standing balance-Leahy Scale: Poor                               Pertinent Vitals/Pain Pain Assessment Pain Assessment: No/denies pain    Home Living Family/patient expects to be discharged to:: Private residence Living Arrangements: Alone Available Help at Discharge: Family;Available PRN/intermittently Type of Home: House Home Access: Stairs to enter Entrance Stairs-Rails: Can reach both Entrance Stairs-Number of Steps: 2   Home Layout: Two level;Able to live on main level with bedroom/bathroom Home Equipment: Rolling Walker (2 wheels);BSC/3in1;Grab bars - toilet;Grab bars - tub/shower Additional Comments: two borthers and two sisters, and a daughter (everyone is within 5 miles)    Prior Function Prior Level of Function : Independent/Modified Independent                     Hand Dominance   Dominant Hand: Right    Extremity/Trunk Assessment             Cervical / Trunk Assessment Cervical / Trunk Assessment: Other exceptions Cervical / Trunk Exceptions: habitus  Communication      Cognition Arousal/Alertness: Awake/alert Behavior During Therapy: WFL for tasks assessed/performed Overall Cognitive Status: Within Functional Limits for tasks assessed                                            02/23/22 0937  PT - End of Session  Equipment Utilized During Treatment Gait belt  Activity Tolerance Patient tolerated treatment well  Patient left in chair;with call bell/phone within reach;with chair alarm set  Nurse Communication Mobility status  PT Assessment  PT Recommendation/Assessment Patient needs continued PT services  PT Visit Diagnosis Muscle weakness (generalized) (M62.81);Difficulty in walking, not elsewhere classified (R26.2);Other  abnormalities of gait and mobility (R26.89)  PT Problem List Decreased strength;Decreased range of motion;Decreased activity tolerance;Decreased balance;Decreased mobility;Decreased knowledge of use of DME;Decreased safety awareness;Decreased knowledge of precautions  PT Plan  PT Frequency (ACUTE ONLY) Min 3X/week  PT Treatment/Interventions (ACUTE ONLY) DME instruction;Gait training;Stair training;Functional mobility training;Therapeutic activities;Therapeutic exercise;Balance training;Patient/family education  AM-PAC PT "6 Clicks" Mobility Outcome Measure (Version 2)  Help needed turning from your back to your side while in a flat bed without using bedrails? 3  Help needed moving from lying on your back to sitting on the side of a flat bed without using bedrails? 3  Help needed moving to and from a bed to a chair (including a wheelchair)? 2  Help needed standing up from a chair using your arms (e.g., wheelchair or bedside chair)? 2  Help needed to walk in hospital room? 3  Help needed climbing 3-5 steps with a railing?  2  6 Click Score 15  Consider Recommendation of Discharge To: CIR/SNF/LTACH  Progressive Mobility  What is the highest level of mobility based on the progressive mobility assessment? Level 4 (Walks with assist in room) - Balance while marching in place and cannot step forward and back - Complete  Activity Ambulated with assistance in room  PT Recommendation  Follow Up  Recommendations Home health PT  Assistance recommended at discharge Frequent or constant Supervision/Assistance  Patient can return home with the following A lot of help with walking and/or transfers;A lot of help with bathing/dressing/bathroom;Assistance with cooking/housework;Direct supervision/assist for medications management;Assist for transportation;Help with stairs or ramp for entrance  Functional Status Assessment Patient has had a recent decline in their functional status and demonstrates the ability to  make significant improvements in function in a reasonable and predictable amount of time.  PT equipment None recommended by PT  Individuals Consulted  Consulted and Agree with Results and Recommendations Patient  Acute Rehab PT Goals  Patient Stated Goal get better and return home  PT Goal Formulation With patient  Time For Goal Achievement 03/09/22  Potential to Achieve Goals Good  PT Time Calculation  PT Start Time (ACUTE ONLY) 0930  PT Stop Time (ACUTE ONLY) 1000  PT Time Calculation (min) (ACUTE ONLY) 30 min  PT General Charges  $$ ACUTE PT VISIT 1 Visit  PT Evaluation  $PT Eval Moderate Complexity 1 Mod  PT Treatments  $Gait Training 8-22 mins     Verner Mould, DPT Acute Rehabilitation Services Office [phone]  02/23/22 2:03 PM

## 2022-02-23 NOTE — Progress Notes (Signed)
[place] for warfarin Indication: atrial fibrillation  Allergies  Allergen Reactions   Heparin Hives    Patient attests severe allergy- rash all over, severe itching, unable to take heparin   Other Itching and Rash    "Sheets and Linens used by [facility] Pontes" Mainly while getting the heparin    Patient Measurements: Height: '5\' 5"'$  (165.1 cm) Weight: 121.1 kg (267 lb) IBW/kg (Calculated) : 57 Heparin Dosing Weight: 86.2 kg   Vital Signs: Temp: 97.9 F (36.6 C) (11/29 0807) Temp Source: Oral (11/29 0807) BP: 161/91 (11/29 0807) Pulse Rate: 83 (11/29 0807)  Labs: Recent Labs    02/20/22 2311 02/21/22 0130 02/21/22 0700 02/21/22 1540 02/22/22 0742 02/23/22 0843  HGB  --   --  11.3*  --  10.2* 9.7*  HCT  --   --  35.2*  --  34.0* 31.0*  PLT  --   --  172  --  159 163  LABPROT  --   --   --  52.6* 45.0* 38.2*  INR  --   --   --  5.9* 4.9* 3.9*  CREATININE  --   --  1.13*  --  0.96 0.85  TROPONINIHS 56* 55*  --   --   --   --      Estimated Creatinine Clearance: 73.4 mL/min (by C-G formula based on SCr of 0.85 mg/dL).   Medical History: Past Medical History:  Diagnosis Date   (HFpEF) heart failure with preserved ejection fraction ([place])    a. 06/2008 Echo: EF 55-60%, mild LVH, triv AI, mild to mod MS, mod to sev dil LA, mildly dil RA.   Acute right MCA stroke ([place])    a. 06/30/3644 Embolic stroke treated with TPA and thrombectomy with hemorrhagic transformation; Carotids 3/12 negative for ICA stenosis.   Arthritis    Bilateral Pulmonary Emboli    a. 08/2005 - s/p IVC filter.   CHF (congestive heart failure) (HCC)    Depression    Embolus of femoral artery ([place])    a. 06/2008 s/p bilateral embolectomies.   History of DVT (deep vein thrombosis)    a. s/p IVC filter.   HIT (heparin-induced thrombocytopenia) (HCC)    HTN (hypertension)    Lupus anticoagulant disorder (HCC)    Mitral stenosis    moderate-severe 09/04/20 echo    Obesity, unspecified    Permanent atrial fibrillation ([place])    a. On chronic Coumadin - INRs followed by PCP; b. CHA2DS2VASc = 5.   Popliteal artery embolism, right ([place])    a. 01/2017 in the setting of subRx INR-->s/p embolectomy.    Medications:  Scheduled:   FLUoxetine  20 mg Oral Daily   metoprolol tartrate  25 mg Oral BID   Warfarin - Pharmacist Dosing Inpatient   Does not apply q1600    Assessment: 77 yof presenting with cough, congestion and weakness for 5 days. On warfarin PTA for hx Afib and VTE. Last appt on 11/7 regimen was 6 mg daily except 4.5 mg TThSat.   INR supratherapeutic, but continues to drift down after smaller dose of warfarin. INR 3.9 today  Goal of Therapy:  INR 2.5-3.5 Monitor platelets by anticoagulation protocol: Yes   Plan:  Warfarin 4.5 mg PO x 1 today Daily INR, s/s bleeding  Thank you Anette Guarneri, PharmD 02/23/2022 11:04 AM

## 2022-02-23 NOTE — Evaluation (Signed)
Occupational Therapy Evaluation Patient Details Name: [name] MRN: [id number] DOB: [birth year] Today's Date: 02/23/2022   History of Present Illness 76 y.o. female presents 11/26 with nausea, vomiting, diarrhea, weakness, and dizziness for 6 days. CXR revealed diffuse interstitial opacities without focal airspace infiltrate. UA positive. PMH includes atrial fibrillation, HIT, R popliteal artery embolism, lupus anticoagulant, history of PE and DVT.   Clinical Impression   PTA, pt was independent in ADL and IADL. Upon eval, pt presents with decreased activity tolerance, safety awareness, and strength. Pt performing UB ADL with set-up and LB ADL with min A with use of adaptive equipment. Pt overall min guard A for transfers from elevated surfaces, but requires up to mod A from recliner. Pt reporting she has lift chair, elevated toilet seat at home and does not have to rise from surface as low as recliner in pt hospital room. Pt with decreased safety with use of RW, requiring min cues for navigation/placement of RW. Pt with family nearby who can assist as needed. Recommending HHOT to optimize safety and independence in ADL and IADL.     Recommendations for follow up therapy are one component of a multi-disciplinary discharge planning process, led by the attending physician.  Recommendations may be updated based on patient status, additional functional criteria and insurance authorization.   Follow Up Recommendations  Home health OT     Assistance Recommended at Discharge Intermittent Supervision/Assistance  Patient can return home with the following A little help with walking and/or transfers;A little help with bathing/dressing/bathroom;Assistance with cooking/housework;Assist for transportation;Help with stairs or ramp for entrance    Functional Status Assessment  Patient has had a recent decline in their functional status and demonstrates the ability to make significant improvements in  function in a reasonable and predictable amount of time.  Equipment Recommendations  None recommended by OT    Recommendations for Other Services       Precautions / Restrictions Precautions Precautions: Fall Restrictions Weight Bearing Restrictions: No      Mobility Bed Mobility Overal bed mobility: Needs Assistance Bed Mobility: Sit to Supine       Sit to supine: Min assist   General bed mobility comments: guidance to bring BLE into bed    Transfers Overall transfer level: Needs assistance Equipment used: Rolling walker (2 wheels) Transfers: Sit to/from Stand Sit to Stand: Min guard, Mod assist           General transfer comment: Min guard from elevated surface of bed and BSC, but mod A from recliner. Pt reports she does not have low surfaces at home.      Balance Overall balance assessment: Needs assistance Sitting-balance support: Feet supported Sitting balance-Leahy Scale: Fair     Standing balance support: Bilateral upper extremity supported, During functional activity, Reliant on assistive device for balance Standing balance-Leahy Scale: Poor Standing balance comment: Min guard A for standing taskss. Poor safety with RW                           ADL either performed or assessed with clinical judgement   ADL Overall ADL's : Needs assistance/impaired Eating/Feeding: Modified independent;Sitting   Grooming: Supervision/safety;Standing;Min guard   Upper Body Bathing: Set up;Sitting   Lower Body Bathing: With adaptive equipment;Sit to/from stand;Min guard;Set up   Upper Body Dressing : Set up;Sitting   Lower Body Dressing: Min guard;Set up;With adaptive equipment;Sit to/from stand   Toilet Transfer: Moderate assistance;Ambulation;Rolling walker (2 wheels);BSC/3in1 Toilet  Transfer Details (indicate cue type and reason): Mod A up from recliner to [facility], but min guard up from [facility]. Per pt report, none of the surfaces at her home are as low as  the recliner in her room. Toileting- Clothing Manipulation and Hygiene: Maximal assistance;Sit to/from stand Toileting - Clothing Manipulation Details (indicate cue type and reason): Pt performing anterior pericare but reliant on OT for posterior pericare due to fear of getting IV soiled     Functional mobility during ADLs: Min guard;Rolling walker (2 wheels) General ADL Comments: limited by activity tolerance, weakness     Vision Baseline Vision/History: 1 Wears glasses Ability to See in Adequate Light: 0 Adequate Patient Visual Report: No change from baseline Vision Assessment?: No apparent visual deficits Additional Comments: WFL for tasks assessed     Perception Perception Perception Tested?: No   Praxis Praxis Praxis tested?: Not tested    Pertinent Vitals/Pain       Hand Dominance Right   Extremity/Trunk Assessment Upper Extremity Assessment Upper Extremity Assessment: Generalized weakness   Lower Extremity Assessment Lower Extremity Assessment: Generalized weakness   Cervical / Trunk Assessment Cervical / Trunk Assessment: Other exceptions Cervical / Trunk Exceptions: habitus   Communication Communication Communication: No difficulties   Cognition Arousal/Alertness: Awake/alert Behavior During Therapy: WFL for tasks assessed/performed Overall Cognitive Status: Impaired/Different from baseline Area of Impairment: Memory, Problem solving, Safety/judgement                     Memory: Decreased short-term memory   Safety/Judgement: Decreased awareness of safety   Problem Solving: Slow processing General Comments: Pt recalling date and day of the week with increased time. Pt following all commands, but suspect difficulty following multistep commands. Able to count backward. Will continue to assess. Poor safety with RW, bumping into items in room and rather than attempting to navigate around, pulling walker back and bumping into surface again      General Comments  VSS on 2L    Exercises     Shoulder Instructions      Home Living Family/patient expects to be discharged to:: Private residence Living Arrangements: Alone Available Help at Discharge: Family;Available PRN/intermittently Type of Home: House Home Access: Stairs to enter [employer] of Steps: 2 Entrance Stairs-Rails: Can reach both Home Layout: Two level;Able to live on main level with bedroom/bathroom     Bathroom Shower/Tub: Tub/shower unit         Home Equipment: Conservation officer, nature (2 wheels);BSC/3in1;Grab bars - toilet;Grab bars - tub/shower   Additional Comments: two borthers and two sisters, and a daughter (everyone is within 5 miles)      Prior Functioning/Environment Prior Level of Function : Independent/Modified Independent             Mobility Comments: RW ADLs Comments: independent in ADL, IADL, and driving per pt report, but also reports that she "does not get out of the house much"        OT Problem List: Decreased strength;Decreased activity tolerance;Impaired balance (sitting and/or standing);Decreased safety awareness;Decreased cognition;Decreased knowledge of use of DME or AE      OT Treatment/Interventions: Self-care/ADL training;Therapeutic exercise;DME and/or AE instruction;Therapeutic activities;Cognitive remediation/compensation;Patient/family education;Balance training    OT Goals(Current goals can be found in the care plan section) Acute Rehab OT Goals Patient Stated Goal: get better OT Goal Formulation: With patient Time For Goal Achievement: 03/09/22 Potential to Achieve Goals: Good  OT Frequency: Min 2X/week    Co-evaluation  AM-PAC OT "6 Clicks" Daily Activity     Outcome Measure Help from another person eating meals?: None Help from another person taking care of personal grooming?: A Little Help from another person toileting, which includes using toliet, bedpan, or urinal?: A  Little Help from another person bathing (including washing, rinsing, drying)?: A Little Help from another person to put on and taking off regular upper body clothing?: A Little Help from another person to put on and taking off regular lower body clothing?: A Little 6 Click Score: 19   End of Session Equipment Utilized During Treatment: Gait belt;Rolling walker (2 wheels) Nurse Communication: Mobility status  Activity Tolerance: Patient tolerated treatment well Patient left: in bed;with call bell/phone within reach;with bed alarm set;with nursing/sitter in room  OT Visit Diagnosis: Unsteadiness on feet (R26.81);Muscle weakness (generalized) (M62.81);Other symptoms and signs involving cognitive function                Time: 1140-1210 OT Time Calculation (min): 30 min Charges:  OT General Charges $OT Visit: 1 Visit OT Evaluation $OT Eval Moderate Complexity: 1 Mod OT Treatments $Self Care/Home Management : 8-22 mins  Elder Cyphers, OTR/L [facility] Acute Rehabilitation Office: [phone]   Magnus Ivan 02/23/2022, 2:12 PM

## 2022-02-23 NOTE — TOC Initial Note (Signed)
Transition of Care [facility]) - Initial/Assessment Note    Patient Details  Name: [name] MRN: [id number] Date of Birth: 1945/09/09  [facility] CM/SW Contact:    Cyndi Bender, RN Phone Number: 02/23/2022, 4:30 PM  Clinical Narrative:                 Spoke to patient and her daughter regarding transition needs.  Patient has used [facility] in the past and would like to use them again. Patient has all needed DME except TOC will follow for possible home 02 needs.   Daughter can help with transportation needs.   Expected Discharge Plan: [place] Barriers to Discharge: Continued Medical Work up   Patient Goals and CMS Choice Patient states their goals for this hospitalization and ongoing recovery are:: return home CMS Medicare.gov Compare Post Acute Care list provided to:: Patient Choice offered to / list presented to : Patient  Expected Discharge Plan and Services Expected Discharge Plan: [place]   Discharge Planning Services: CM Consult Post Acute Care Choice: [place] arrangements for the past 2 months: [place]: [place] ([place]) Date [place]: 02/23/22 Time [place]: 1629 Representative spoke with at [place]: [place] Arrangements/Services Living arrangements for the past 2 months: [place]   Patient language and need for interpreter reviewed:: Yes Do you feel safe going back to the place where you live?: Yes      Need for Family Participation in Patient Care: Yes (Comment) Care giver support system in place?: Yes (comment)   Criminal Activity/Legal Involvement Pertinent to Current Situation/Hospitalization: No - Comment as needed  Activities of Daily Living      Permission Sought/Granted         Permission granted to share info w AGENCY: HH        Emotional  Assessment Appearance:: Appears stated age Attitude/Demeanor/Rapport: Engaged Affect (typically observed): Accepting Orientation: : Oriented to Self, Oriented to Place, Oriented to  Time, Oriented to Situation Alcohol / Substance Use: Not Applicable Psych Involvement: No (comment)  Admission diagnosis:  Hypokalemia [E87.6] Gastroenteritis [K52.9] UTI (urinary tract infection) [N39.0] Acute cystitis without hematuria [N30.00] Generalized weakness [R53.1] AKI (acute kidney injury) ([place]) [N17.9] Supratherapeutic INR [R79.1] Patient Active Problem List   Diagnosis Date Noted   UTI (urinary tract infection) 02/20/2022   CAP (community acquired pneumonia) 02/20/2022   Hypokalemia 02/20/2022   Hypophosphatemia 02/20/2022   AKI (acute kidney injury) ([place]) 02/20/2022   Elevated INR 02/20/2022   Chronic atrial fibrillation with RVR ([place]) 03/04/2021   Acute blood loss anemia 12/15/2020   Lupus anticoagulant syndrome ([place]) 12/15/2020   Bleeding from wound 12/14/2020   Symptomatic anemia 12/14/2020   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, left    Acute on chronic systolic HF (heart failure) ([place]) 09/28/2020   Coccyx pain    CHF (congestive heart failure) ([place]) 09/04/2020   Acute respiratory failure with hypoxia ([place]) 09/04/2020   Fall at home, initial encounter 09/04/2020   Mitral valve stenosis    Essential hypertension 06/27/2017   History of lupus anticoagulant disorder 03/31/2017   NICM (nonischemic cardiomyopathy) ([place]) 03/31/2017   Streptococcal bacteremia    History of MRSA infection    Wound infection  03/24/2017   Cellulitis of right lower extremity 03/24/2017   Sepsis due to cellulitis ([place]) 03/04/2017   Popliteal artery embolus ([place]) 02/16/2017   Encounter for therapeutic drug monitoring 06/11/2013   History of CVA (cerebrovascular accident) 11/18/2010   Long term (current) use of anticoagulants 08/09/2010   Obesity, unspecified 08/14/2008   Permanent atrial  fibrillation ([place]) 03/07/2008   History of pulmonary embolism 08/30/2005   PCP:  Josetta Huddle, MD Pharmacy:   [facility] ([facility] [place] [address]  ([place]) [place] [address] Phone: [phone] Fax: [phone]     Social Determinants of Health (SDOH) Interventions    Readmission Risk Interventions     No data to display

## 2022-02-23 NOTE — Progress Notes (Signed)
PROGRESS NOTE    [name]  YNW:[id number] DOB: April 27, 1945 DOA: 02/20/2022 PCP: Josetta Huddle, MD   Chief Complaint  Patient presents with   Weakness    Brief Narrative:   [name] is a 76 y.o. 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 In the ED temperature 99.30F, rapid AFib with rates 120s-140s confirmed by ECG, tachypneic at 20/min. Neutrophilic leukocytosis (WBC 13.8k; 11.8k PMNs), macrocytosis with hgb 14, MVC 104.6, hypokalemia (K 3.3) and hypophosphatemia (phos level 2), supratherapeutic INR (6.8). CXR revealed diffuse interstitial opacities without focal airspace infiltrate. UA positive, though the patient denies urinary symptoms. Covid, influenza PCR negative, full respiratory viral panel pending.   HR improved with IVF and metoprolol to 110's. Potassium and phosphorus supplemented  Assessment & Plan:   Principal Problem:   UTI (urinary tract infection) Active Problems:   History of CVA (cerebrovascular accident)   Essential hypertension   Chronic atrial fibrillation with RVR (HCC)   CAP (community acquired pneumonia)   Hypokalemia   Hypophosphatemia   AKI (acute kidney injury) ([place])   Elevated INR   Sepsis and acute hypoxic respiratory failure due to multifocal/interstitial pneumonia>> RSV vial pneumonia - Does not appear blood and sputum cultures were sent. WBC improving. Continue empiric antibiotics. If RVP positive, would be able to stop abx. - Note +UA though the patient does deny any urinary symptoms. Came in purely for respiratory symptoms in addition to constitutional. Culture growing GNRs.  - Encourage incentive spirometry, OOB, PT/OT  consulted.   UTI -Patient with positive UA, endorses pyuria, urine culture growing E. coli, continue with IV antibiotics   Valvular AFib with RVR: Rate remains controlled for the most part. Chronic systolic CHF - Continue metoprolol '25mg'$  po BID and IV prn -EF around 50%, repeat echo this admission EF remained stable around 45 to 50%.   - last echo and rheumatic mod-severe MS, mild MR, will recheck echocardiogram. If LVEF down (was 50% per cardiologist, Dr. Percival Spanish), would have to avoid CCBs.    AKI: Improved with IVF and then improved further after fluids stopped, BNP 462. - Holding ARB - Treat sepsis.   Supratherapeutic INR: No bleeding, at high risk of clots if reversed, so allowing to trend down.  - Continues slow trend downward, 4.9 this AM. Recheck daily   History of HIT, lupus anticoagulant, DVT, PE:  - Anticoagulation.   Macrocytic anemia: M57, folic acid, iron panel largely wnl. Isolated low TIBC of uncertain significance. - Drop in hgb suspected to be dilutional, no bleeding noted. Will continue monitoring.   Hypokalemia: Resolved.    Hypophosphatemia: Resolved with supplementation   Depression: Quiescent - Continue SSRI   History of CVA: No new deficits.   Morbid obesity: Body mass index is 44.43 kg/m.     DVT prophylaxis: On warfarin Code Status: Full code Family Communication: None at bedside Disposition:   Status is: Inpatient    Consultants:  None  Subjective:  Interval generalized weakness, fatigue, reports cough, minimal dyspnea, she does endorse some dysuria over the last few days when I asked her today.  Objective: Vitals:   02/22/22 2120 02/23/22 0002 02/23/22 0312 02/23/22 0807  BP: 126/88 116/73 (!) 128/93 (!) 161/91  Pulse: 83 82 92 83  Resp: '18 20 19 20  '$ Temp: (!) 97.2 F (36.2 C) 97.6 F (36.4 C) 97.7 F (36.5 C) 97.9 F (36.6 C)  TempSrc: Oral Oral Oral Oral  SpO2: 100% 100% 100% 100%  Weight:      Height:       No intake  or output data in the 24 hours ending 02/23/22 1417 Filed Weights   02/20/22 1548  Weight: 121.1 kg    Examination:  Awake Alert, Oriented X 3, frail, deconditioned Symmetrical Chest wall movement, Good air movement bilaterally, CTAB RRR,No Gallops,Rubs or new Murmurs, No Parasternal Heave +ve B.Sounds, Abd Soft, No tenderness, No rebound - guarding or rigidity. No Cyanosis, Clubbing or edema, No new Rash or bruise      Data Reviewed: I have personally reviewed following labs and imaging studies  CBC: Recent Labs  Lab 02/20/22 1620 02/21/22 0700 02/22/22 0742 02/23/22 0843  WBC 13.8* 14.7* 12.7* 12.0*  NEUTROABS 11.3* 11.3* 9.9*  --   HGB 14.0 11.3* 10.2* 9.7*  HCT 42.9 35.2* 34.0* 31.0*  MCV 104.6* 105.1* 109.0* 107.3*  PLT 196 172 159 213    Basic Metabolic Panel: Recent Labs  Lab 02/20/22 1620 02/21/22 0700 02/22/22 0742 02/23/22 0843  NA 141 142 142 144  K 3.3* 4.3 4.1 3.9  CL 102 107 107 106  CO2 '24 25 30 '$ 32  GLUCOSE 183* 144* 115* 107*  BUN 42* 33* 20 14  CREATININE 1.64* 1.13* 0.96 0.85  CALCIUM 9.0 8.3* 8.3* 8.3*  MG 2.2  --   --   --   PHOS 2.0* 3.8 3.3  --     GFR: Estimated Creatinine Clearance: 73.4 mL/min (by C-G formula based on SCr of 0.85 mg/dL).  Liver Function Tests: Recent Labs  Lab 02/20/22 1620  AST 21  ALT 15  ALKPHOS 74  BILITOT 1.4*  PROT 7.2  ALBUMIN 2.8*    CBG: No results for input(s): "GLUCAP" in the last 168 hours.   Recent Results (from the past 240 hour(s))  Resp Panel by RT-PCR (Flu A&B, Covid) Anterior Nasal Swab     Status: None   Collection Time: 02/20/22  4:54 PM   Specimen: Anterior Nasal Swab  Result Value Ref Range Status   SARS Coronavirus 2 by RT PCR NEGATIVE NEGATIVE Final    Comment: (NOTE) SARS-CoV-2 target nucleic acids are NOT DETECTED.  The SARS-CoV-2 RNA is generally detectable in upper respiratory specimens during the acute phase of infection. The lowest concentration of SARS-CoV-2 viral  copies this assay can detect is 138 copies/mL. A negative result does not preclude SARS-Cov-2 infection and should not be used as the sole basis for treatment or other patient management decisions. A negative result may occur with  improper specimen collection/handling, submission of specimen other than nasopharyngeal swab, presence of viral mutation(s) within the areas targeted by this assay, and inadequate number of viral copies(<138 copies/mL). A negative result must be combined with clinical observations, patient history, and epidemiological information. The expected result is Negative.  Fact Sheet for Patients:  EntrepreneurPulse.com.au  Fact Sheet for Healthcare Providers:  IncredibleEmployment.be  This test is no t yet approved or cleared by the [place] FDA and  has been authorized for detection and/or diagnosis of SARS-CoV-2 by FDA under an Emergency Use Authorization (EUA). This EUA will remain  in effect (meaning this test can be used) for the duration of the COVID-19 declaration under Section 564(b)(1) of the Act, 21 U.S.C.section  360bbb-3(b)(1), unless the authorization is terminated  or revoked sooner.       Influenza A by PCR NEGATIVE NEGATIVE Final   Influenza B by PCR NEGATIVE NEGATIVE Final    Comment: (NOTE) The Xpert Xpress SARS-CoV-2/FLU/RSV plus assay is intended as an aid in the diagnosis of influenza from Nasopharyngeal swab specimens and should not be used as a sole basis for treatment. Nasal washings and aspirates are unacceptable for Xpert Xpress SARS-CoV-2/FLU/RSV testing.  Fact Sheet for Patients: EntrepreneurPulse.com.au  Fact Sheet for Healthcare Providers: IncredibleEmployment.be  This test is not yet approved or cleared by the [place] FDA and has been authorized for detection and/or diagnosis of SARS-CoV-2 by FDA under an Emergency Use Authorization (EUA). This  EUA will remain in effect (meaning this test can be used) for the duration of the COVID-19 declaration under Section 564(b)(1) of the Act, 21 U.S.C. section 360bbb-3(b)(1), unless the authorization is terminated or revoked.  Performed at [facility], [place] [address], [place], [place] [address]   Urine Culture     Status: Abnormal   Collection Time: 02/20/22 10:24 PM   Specimen: Urine, Clean Catch  Result Value Ref Range Status   Specimen Description URINE, CLEAN CATCH  Final   Special Requests   Final    NONE Performed at [facility], [place] [address], [place],  [address]    Culture >=100,000 COLONIES/mL ESCHERICHIA COLI (A)  Final   Report Status 02/23/2022 FINAL  Final   Organism ID, Bacteria ESCHERICHIA COLI (A)  Final      Susceptibility   Escherichia coli - MIC*    AMPICILLIN >=32 RESISTANT Resistant     CEFAZOLIN 8 SENSITIVE Sensitive     CEFEPIME <=0.12 SENSITIVE Sensitive     CEFTRIAXONE <=0.25 SENSITIVE Sensitive     CIPROFLOXACIN <=0.25 SENSITIVE Sensitive     GENTAMICIN <=1 SENSITIVE Sensitive     IMIPENEM <=0.25 SENSITIVE Sensitive     NITROFURANTOIN <=16 SENSITIVE Sensitive     TRIMETH/SULFA <=20 SENSITIVE Sensitive     AMPICILLIN/SULBACTAM >=32 RESISTANT Resistant     PIP/TAZO <=4 SENSITIVE Sensitive     * >=100,000 COLONIES/mL ESCHERICHIA COLI  Respiratory (~20 pathogens) panel by PCR     Status: Abnormal   Collection Time: 02/22/22  6:27 AM   Specimen: Nasopharyngeal Swab; Respiratory  Result Value Ref Range Status   Adenovirus NOT DETECTED NOT DETECTED Final   Coronavirus 229E NOT DETECTED NOT DETECTED Final    Comment: (NOTE) The Coronavirus on the Respiratory Panel, DOES NOT test for the novel  Coronavirus (2019 nCoV)    Coronavirus HKU1 NOT DETECTED NOT DETECTED Final   Coronavirus NL63 NOT DETECTED NOT DETECTED Final   Coronavirus OC43 NOT DETECTED NOT DETECTED Final   Metapneumovirus NOT DETECTED NOT DETECTED Final   Rhinovirus  / Enterovirus NOT DETECTED NOT DETECTED Final   Influenza A NOT DETECTED NOT DETECTED Final   Influenza B NOT DETECTED NOT DETECTED Final   Parainfluenza Virus 1 NOT DETECTED NOT DETECTED Final   Parainfluenza Virus 2 NOT DETECTED NOT DETECTED Final   Parainfluenza Virus 3 NOT DETECTED NOT DETECTED Final   Parainfluenza Virus 4 NOT DETECTED NOT DETECTED Final   Respiratory Syncytial Virus DETECTED (A) NOT DETECTED Final   Bordetella pertussis NOT DETECTED NOT DETECTED Final   Bordetella Parapertussis NOT DETECTED NOT DETECTED Final   Chlamydophila pneumoniae NOT DETECTED NOT DETECTED Final   Mycoplasma pneumoniae NOT DETECTED NOT DETECTED Final    Comment:  Performed at [facility], [place] [address] [place], [place] [address]         Radiology Studies: DG CHEST PORT 1 VIEW  Result Date: 02/23/2022 CLINICAL DATA:  [id number] with acute respiratory failure with hypoxia. EXAM: PORTABLE CHEST 1 VIEW COMPARISON:  AP lateral 02/20/2022 FINDINGS: The heart is moderately enlarged. There is mild central vascular distension without overt findings of edema. No pleural effusion is seen. There is calcification of the transverse aorta with unremarkable mediastinum. The lungs are clear of infiltrates. IMPRESSION: 1. Cardiomegaly with mild central vascular congestion but no overt findings of edema or pneumonia. 2. Aortic atherosclerosis. Electronically Signed   By: Telford Nab M.D.   On: 02/23/2022 06:15   ECHOCARDIOGRAM COMPLETE  Result Date: 02/22/2022    ECHOCARDIOGRAM REPORT   Patient Name:   Kahmari HENDY BRINDLE Date of Exam: 02/22/2022 Medical Rec #:  [id number]       Height:       65.0 in Accession #:    [id number]      Weight:       267.0 lb Date of Birth:  Jul 04, 1945       BSA:          2.237 m Patient Age:    12 years        BP:           131/73 mmHg Patient Gender: F               HR:           86 bpm. Exam Location:  Inpatient Procedure: 2D Echo, Cardiac Doppler and Color Doppler Indications:     atrial fibrillation. cardiomegaly. mitral valve disorder.  History:        Patient has prior history of Echocardiogram examinations, most                 recent 03/30/2021. History of stroke, Arrythmias:Atrial                 Fibrillation; Risk Factors:Hypertension.  Sonographer:    Johny Chess RDCS Referring Phys: [place] Comments: Patient is obese. Image acquisition challenging due to patient body habitus and coughing. IMPRESSIONS  1. Left ventricular ejection fraction, by estimation, is 45 to 50%. The left ventricle has mildly decreased function. The left ventricle demonstrates global hypokinesis. Left ventricular diastolic function could not be evaluated.  2. Right ventricular systolic function is normal. The right ventricular size is normal. There is mildly elevated pulmonary artery systolic pressure. The estimated right ventricular systolic pressure is 71.2 mmHg.  3. Left atrial size was severely dilated.  4. Right atrial size was moderately dilated.  5. The mitral valve is rheumatic. Mild to moderate mitral valve regurgitation. Moderate to severe mitral stenosis. The mean mitral valve gradient is 10.2 mmHg with average heart rate of 80 bpm. Moderate mitral annular calcification.  6. Low flow low gradient moderate aortic stenosis. The aortic valve is tricuspid. Aortic valve regurgitation is mild to moderate. Moderate aortic valve stenosis.  7. The inferior vena cava is normal in size with greater than 50% respiratory variability, suggesting right atrial pressure of 3 mmHg. Comparison(s): No significant change from prior study. Prior images reviewed side by side. FINDINGS  Left Ventricle: Left ventricular ejection fraction, by estimation, is 45 to 50%. The left ventricle has mildly decreased function. The left ventricle demonstrates global hypokinesis. The left ventricular internal cavity size was normal in size. There is  borderline  concentric left ventricular hypertrophy. Left  ventricular diastolic function could not be evaluated due to atrial fibrillation. Left ventricular diastolic function could not be evaluated. Right Ventricle: The right ventricular size is normal. No increase in right ventricular wall thickness. Right ventricular systolic function is normal. There is mildly elevated pulmonary artery systolic pressure. The tricuspid regurgitant velocity is 3.10  m/s, and with an assumed right atrial pressure of 3 mmHg, the estimated right ventricular systolic pressure is 70.3 mmHg. Left Atrium: Left atrial size was severely dilated. Right Atrium: Right atrial size was moderately dilated. Pericardium: There is no evidence of pericardial effusion. Mitral Valve: The mitral valve is rheumatic. There is severe thickening of the anterior and posterior mitral valve leaflet(s). There is moderate calcification of the mitral valve leaflet(s). Severely decreased mobility of the mitral valve leaflets. Moderate mitral annular calcification. Mild to moderate mitral valve regurgitation, with centrally-directed jet. Moderate to severe mitral valve stenosis. Mitral valve area by planimetry is 1.03 cm, and by the pressure half-time method is calculated to be 1.01 cm. The mean transmitral gradient is 10.2 mmHg. MV peak gradient, 22.8 mmHg. The mean mitral valve gradient is 10.2 mmHg with average heart rate of 80 bpm. Tricuspid Valve: The tricuspid valve is normal in structure. Tricuspid valve regurgitation is mild. Aortic Valve: Low flow low gradient moderate aortic stenosis. The aortic valve is tricuspid. Aortic valve regurgitation is mild to moderate. Moderate aortic stenosis is present. Aortic valve mean gradient measures 10.6 mmHg. Aortic valve peak gradient measures 19.1 mmHg. Aortic valve area, by VTI measures 1.06 cm. Pulmonic Valve: The pulmonic valve was grossly normal. Pulmonic valve regurgitation is not visualized. Aorta: The aortic root and ascending aorta are structurally normal, with  no evidence of dilitation. Venous: The inferior vena cava is normal in size with greater than 50% respiratory variability, suggesting right atrial pressure of 3 mmHg. IAS/Shunts: There is right bowing of the interatrial septum, suggestive of elevated left atrial pressure. No atrial level shunt detected by color flow Doppler.  LEFT VENTRICLE PLAX 2D LVIDd:         3.90 cm LVIDs:         3.00 cm LV PW:         1.20 cm LV IVS:        1.00 cm LVOT diam:     1.90 cm LV SV:         51 LV SV Index:   23 LVOT Area:     2.84 cm  RIGHT VENTRICLE          IVC RV Basal diam:  2.80 cm  IVC diam: 1.80 cm LEFT ATRIUM              Index         RIGHT ATRIUM           Index LA diam:        5.90 cm  2.64 cm/m    RA Area:     23.50 cm LA Vol (A2C):   270.0 ml 120.72 ml/m  RA Volume:   61.00 ml  27.27 ml/m LA Vol (A4C):   194.0 ml 86.74 ml/m LA Biplane Vol: 231.0 ml 103.28 ml/m  AORTIC VALVE AV Area (Vmax):    1.04 cm AV Area (Vmean):   0.99 cm AV Area (VTI):     1.06 cm AV Vmax:           218.80 cm/s AV Vmean:  151.600 cm/s AV VTI:            0.476 m AV Peak Grad:      19.1 mmHg AV Mean Grad:      10.6 mmHg LVOT Vmax:         80.26 cm/s LVOT Vmean:        52.900 cm/s LVOT VTI:          0.179 m LVOT/AV VTI ratio: 0.38  AORTA Ao Root diam: 2.80 cm Ao Asc diam:  3.30 cm MITRAL VALVE                TRICUSPID VALVE MV Area (PHT):  1.01 cm    TR Peak grad:   38.4 mmHg MV Area (plan): 1.03 cm    TR Vmax:        310.00 cm/s MV Area VTI:    0.74 cm MV Peak grad:   22.8 mmHg   SHUNTS MV Mean grad:   10.2 mmHg   Systemic VTI:  0.18 m MV Vmax:        2.39 m/s    Systemic Diam: 1.90 cm MV Vmean:       150.2 cm/s MV Decel Time:  751 msec Mihai Croitoru MD Electronically signed by Sanda Klein MD Signature Date/Time: 02/22/2022/5:53:27 PM    Final         Scheduled Meds:  FLUoxetine  20 mg Oral Daily   metoprolol tartrate  25 mg Oral BID   warfarin  4.5 mg Oral ONCE-1600   Warfarin - Pharmacist Dosing Inpatient    Does not apply q1600   Continuous Infusions:  cefTRIAXone (ROCEPHIN)  IV Stopped (02/22/22 2330)     LOS: 3 days     Phillips Climes, MD [facility]   To contact the attending provider between 7A-7P or the covering provider during after hours 7P-7A, please log into the web site [email or web address] and access using universal [place] password for that web site. If you do not have the password, please call the hospital operator.  02/23/2022, 2:17 PM

## 2022-02-24 DIAGNOSIS — I482 Chronic atrial fibrillation, unspecified: Secondary | ICD-10-CM | POA: Diagnosis not present

## 2022-02-24 DIAGNOSIS — J121 Respiratory syncytial virus pneumonia: Secondary | ICD-10-CM | POA: Diagnosis not present

## 2022-02-24 DIAGNOSIS — N179 Acute kidney failure, unspecified: Secondary | ICD-10-CM | POA: Diagnosis not present

## 2022-02-24 DIAGNOSIS — N3000 Acute cystitis without hematuria: Secondary | ICD-10-CM | POA: Diagnosis not present

## 2022-02-24 LAB — PROTIME-INR
INR: 4.2 (ref 0.8–1.2)
Prothrombin Time: 40 seconds — ABNORMAL HIGH (ref 11.4–15.2)

## 2022-02-24 MED ORDER — GUAIFENESIN-DM 100-10 MG/5ML PO SYRP
5.0000 mL | ORAL_SOLUTION | ORAL | Status: DC | PRN
  Administered 2022-02-24: 5 mL via ORAL
  Filled 2022-02-24: qty 5

## 2022-02-24 MED ORDER — WARFARIN SODIUM 1 MG PO TABS
1.0000 mg | ORAL_TABLET | Freq: Once | ORAL | Status: AC
  Administered 2022-02-24: 1 mg via ORAL
  Filled 2022-02-24: qty 1

## 2022-02-24 NOTE — Progress Notes (Signed)
PROGRESS NOTE    [name]  KWI:[id number] DOB: [birth year] DOA: 02/20/2022 PCP: Josetta Huddle, MD   Chief Complaint  Patient presents with   Weakness    Brief Narrative:   [name] is a 76 y.o. 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 In the ED temperature 99.32F, rapid AFib with rates 120s-140s confirmed by ECG, tachypneic at 20/min. Neutrophilic leukocytosis (WBC 13.8k; 11.8k PMNs), macrocytosis with hgb 14, MVC 104.6, hypokalemia (K 3.3) and hypophosphatemia (phos level 2), supratherapeutic INR (6.8). CXR revealed diffuse interstitial opacities without focal airspace infiltrate. UA positive, though the patient denies urinary symptoms. Covid, influenza PCR negative, full respiratory viral panel pending.   HR improved with IVF and metoprolol to 110's. Potassium and phosphorus supplemented  Assessment & Plan:   Principal Problem:   UTI (urinary tract infection) Active Problems:   History of CVA (cerebrovascular accident)   Essential hypertension   Chronic atrial fibrillation with RVR (HCC)   CAP (community acquired pneumonia)   Hypokalemia   Hypophosphatemia   AKI (acute kidney injury) ([facility])   Elevated INR   Sepsis and acute hypoxic respiratory failure due to multifocal/interstitial pneumonia>> RSV vial pneumonia - Does not appear blood and sputum cultures were sent. WBC improving. Continue empiric antibiotics. If RVP positive, would be able to stop abx. - Note +UA though the patient does deny any urinary symptoms. Came in purely for respiratory symptoms in addition to constitutional. Culture growing GNRs.  - Encourage incentive spirometry, OOB, PT/OT  consulted. -She remains on 2 L oxygen via nasal cannula today, she was encouraged acanthus incentive spirometry and flutter valve, will try to wean as tolerated   UTI -Patient with positive UA, endorses pyuria, urine culture growing E. coli, continue with IV antibiotics   Valvular AFib with RVR:  - Rate remains controlled for the most part.  Chronic systolic CHF - Continue metoprolol '25mg'$  po BID and IV prn -EF around 50%, repeat echo this admission EF remained stable around 45 to 50%.   - last echo and rheumatic mod-severe MS, mild MR, will recheck echocardiogram. If LVEF down (was 50% per cardiologist, Dr. Percival Spanish), would have to avoid CCBs.    AKI: - Improved with IVF and then improved further after fluids stopped, BNP 462. - Holding ARB - Treat sepsis.   Supratherapeutic INR: No bleeding, at high risk of clots if reversed, so allowing to trend down.  - Continues slow trend downward, 4.9 this AM. Recheck daily   History of HIT, lupus anticoagulant, DVT, PE:  - Anticoagulation.   Macrocytic anemia: M42, folic acid, iron panel largely wnl. Isolated low TIBC of uncertain significance. - Drop in hgb suspected to be dilutional, no bleeding noted. Will continue monitoring.   Hypokalemia: Resolved.    Hypophosphatemia: Resolved with supplementation   Depression: Quiescent - Continue SSRI   History of CVA: No new deficits.   Morbid obesity: Body mass index is 44.43 kg/m.     DVT prophylaxis: On warfarin Code Status: Full code Family Communication: None at bedside Disposition:   Status is: Inpatient    Consultants:  None  Subjective:  Still reports cough, shortness of breath, but report is much improved, still reports generalized weakness and  fatigue.    Objective: Vitals:   02/24/22 0300 02/24/22 0800 02/24/22 0931 02/24/22 1145  BP: 129/76 (!) 109/57 (!) 104/59 (!) 130/57  Pulse: 70 65 (!) 102 72  Resp: '18 17  18  '$ Temp: 98 F (36.7 C) 98.2 F (36.8 C)   98.2 F (36.8 C)  TempSrc: Oral Oral  Oral  SpO2:  98%  100%  Weight:      Height:        Intake/Output Summary (Last 24 hours) at 02/24/2022 1536 Last data filed at 02/24/2022 1410 Gross per 24 hour  Intake 340 ml  Output 500 ml  Net -160 ml   Filed Weights   02/20/22 1548  Weight: 121.1 kg    Examination:  Awake Alert, Oriented X 3, frail, deconditioned Symmetrical Chest wall movement, Good air movement bilaterally, CTAB RRR,No Gallops,Rubs or new Murmurs, No Parasternal Heave +ve B.Sounds, Abd Soft, No tenderness, No rebound - guarding or rigidity. No Cyanosis, Clubbing or edema, No new Rash or bruise       Data Reviewed: I have personally reviewed following labs and imaging studies  CBC: Recent Labs  Lab 02/20/22 1620 02/21/22 0700 02/22/22 0742 02/23/22 0843  WBC 13.8* 14.7* 12.7* 12.0*  NEUTROABS 11.3* 11.3* 9.9*  --   HGB 14.0 11.3* 10.2* 9.7*  HCT 42.9 35.2* 34.0* 31.0*  MCV 104.6* 105.1* 109.0* 107.3*  PLT 196 172 159 353    Basic Metabolic Panel: Recent Labs  Lab 02/20/22 1620 02/21/22 0700 02/22/22 0742 02/23/22 0843  NA 141 142 142 144  K 3.3* 4.3 4.1 3.9  CL 102 107 107 106  CO2 '24 25 30 '$ 32  GLUCOSE 183* 144* 115* 107*  BUN 42* 33* 20 14  CREATININE 1.64* 1.13* 0.96 0.85  CALCIUM 9.0 8.3* 8.3* 8.3*  MG 2.2  --   --   --   PHOS 2.0* 3.8 3.3  --     GFR: Estimated Creatinine Clearance: 73.4 mL/min (by C-G formula based on SCr of 0.85 mg/dL).  Liver Function Tests: Recent Labs  Lab 02/20/22 1620  AST 21  ALT 15  ALKPHOS 74  BILITOT 1.4*  PROT 7.2  ALBUMIN 2.8*    CBG: No results for input(s): "GLUCAP" in the last 168 hours.   Recent Results (from the past 240 hour(s))  Resp Panel by RT-PCR (Flu A&B, Covid) Anterior Nasal Swab     Status: None   Collection Time: 02/20/22  4:54 PM   Specimen: Anterior Nasal Swab  Result Value Ref Range Status   SARS Coronavirus 2 by RT PCR NEGATIVE NEGATIVE Final    Comment:  (NOTE) SARS-CoV-2 target nucleic acids are NOT DETECTED.  The SARS-CoV-2 RNA is generally detectable in upper respiratory specimens during the acute phase of infection. The lowest concentration of SARS-CoV-2 viral copies this assay can detect is 138 copies/mL. A negative result does not preclude SARS-Cov-2 infection and should not be used as the sole basis for treatment or other patient management decisions. A negative result may occur with  improper specimen collection/handling, submission of specimen other than nasopharyngeal swab, presence of viral mutation(s) within the areas targeted by this assay, and inadequate number of viral copies(<138 copies/mL). A negative result must be combined with clinical observations, patient history, and epidemiological information. The expected result is Negative.  Fact Sheet for Patients:  EntrepreneurPulse.com.au  Fact Sheet for Healthcare Providers:  IncredibleEmployment.be  This test is no t yet approved or cleared by the [place] and  has been authorized for detection and/or diagnosis of SARS-CoV-2 by FDA under an Emergency Use Authorization (EUA). This EUA will remain  in effect (meaning this test can be used) for the duration of the COVID-19 declaration under Section 564(b)(1) of the Act, 21 U.S.C.section 360bbb-3(b)(1), unless the authorization is terminated  or revoked sooner.       Influenza A by PCR NEGATIVE NEGATIVE Final   Influenza B by PCR NEGATIVE NEGATIVE Final    Comment: (NOTE) The Xpert Xpress SARS-CoV-2/FLU/RSV plus assay is intended as an aid in the diagnosis of influenza from Nasopharyngeal swab specimens and should not be used as a sole basis for treatment. Nasal washings and aspirates are unacceptable for Xpert Xpress SARS-CoV-2/FLU/RSV testing.  Fact Sheet for Patients: EntrepreneurPulse.com.au  Fact Sheet for Healthcare  Providers: IncredibleEmployment.be  This test is not yet approved or cleared by the [place] FDA and has been authorized for detection and/or diagnosis of SARS-CoV-2 by FDA under an Emergency Use Authorization (EUA). This EUA will remain in effect (meaning this test can be used) for the duration of the COVID-19 declaration under Section 564(b)(1) of the Act, 21 U.S.C. section 360bbb-3(b)(1), unless the authorization is terminated or revoked.  Performed at [facility], [place] [address] [place], [place] [address]   Urine Culture     Status: Abnormal   Collection Time: 02/20/22 10:24 PM   Specimen: Urine, Clean Catch  Result Value Ref Range Status   Specimen Description URINE, CLEAN CATCH  Final   Special Requests   Final    NONE Performed at [facility], [place] [address], [place], [place] [address]    Culture >=100,000 COLONIES/mL ESCHERICHIA COLI (A)  Final   Report Status 02/23/2022 FINAL  Final   Organism ID, Bacteria ESCHERICHIA COLI (A)  Final      Susceptibility   Escherichia coli - MIC*    AMPICILLIN >=32 RESISTANT Resistant     CEFAZOLIN 8 SENSITIVE Sensitive     CEFEPIME <=0.12 SENSITIVE Sensitive     CEFTRIAXONE <=0.25 SENSITIVE Sensitive     CIPROFLOXACIN <=0.25 SENSITIVE Sensitive     GENTAMICIN <=1 SENSITIVE Sensitive     IMIPENEM <=0.25 SENSITIVE Sensitive     NITROFURANTOIN <=16 SENSITIVE Sensitive     TRIMETH/SULFA <=20 SENSITIVE Sensitive     AMPICILLIN/SULBACTAM >=32 RESISTANT Resistant     PIP/TAZO <=4 SENSITIVE Sensitive     * >=100,000 COLONIES/mL ESCHERICHIA COLI  Respiratory (~20 pathogens) panel by PCR     Status: Abnormal   Collection Time: 02/22/22  6:27 AM   Specimen: Nasopharyngeal Swab; Respiratory  Result Value Ref Range Status   Adenovirus NOT DETECTED NOT DETECTED Final   Coronavirus 229E NOT DETECTED NOT DETECTED Final    Comment: (NOTE) The Coronavirus on the Respiratory Panel, DOES NOT test for the novel   Coronavirus (2019 nCoV)    Coronavirus HKU1 NOT DETECTED NOT DETECTED Final   Coronavirus NL63 NOT DETECTED NOT DETECTED Final   Coronavirus OC43 NOT DETECTED NOT DETECTED Final   Metapneumovirus NOT DETECTED NOT DETECTED Final   Rhinovirus / Enterovirus NOT DETECTED NOT DETECTED Final   Influenza A NOT DETECTED NOT DETECTED Final   Influenza B NOT DETECTED NOT DETECTED Final   Parainfluenza Virus 1 NOT DETECTED NOT DETECTED Final   Parainfluenza Virus 2 NOT DETECTED NOT DETECTED Final   Parainfluenza Virus 3 NOT DETECTED NOT DETECTED Final   Parainfluenza Virus 4 NOT DETECTED NOT DETECTED Final   Respiratory Syncytial Virus  DETECTED (A) NOT DETECTED Final   Bordetella pertussis NOT DETECTED NOT DETECTED Final   Bordetella Parapertussis NOT DETECTED NOT DETECTED Final   Chlamydophila pneumoniae NOT DETECTED NOT DETECTED Final   Mycoplasma pneumoniae NOT DETECTED NOT DETECTED Final    Comment: Performed at [facility], [place] [address] [place], [place] [address]         Radiology Studies: DG CHEST PORT 1 VIEW  Result Date: 02/23/2022 CLINICAL DATA:  [id number] with acute respiratory failure with hypoxia. EXAM: PORTABLE CHEST 1 VIEW COMPARISON:  AP lateral 02/20/2022 FINDINGS: The heart is moderately enlarged. There is mild central vascular distension without overt findings of edema. No pleural effusion is seen. There is calcification of the transverse aorta with unremarkable mediastinum. The lungs are clear of infiltrates. IMPRESSION: 1. Cardiomegaly with mild central vascular congestion but no overt findings of edema or pneumonia. 2. Aortic atherosclerosis. Electronically Signed   By: Telford Nab M.D.   On: 02/23/2022 06:15   ECHOCARDIOGRAM COMPLETE  Result Date: 02/22/2022    ECHOCARDIOGRAM REPORT   Patient Name:   [name] Date of Exam: 02/22/2022 Medical Rec #:  [id number]       Height:       65.0 in Accession #:    [id number]      Weight:       267.0 lb Date of  Birth:  [birth year]       BSA:          2.237 m Patient Age:    28 years        BP:           131/73 mmHg Patient Gender: F               HR:           86 bpm. Exam Location:  Inpatient Procedure: 2D Echo, Cardiac Doppler and Color Doppler Indications:    atrial fibrillation. cardiomegaly. mitral valve disorder.  History:        Patient has prior history of Echocardiogram examinations, most                 recent 03/30/2021. History of stroke, Arrythmias:Atrial                 Fibrillation; Risk Factors:Hypertension.  Sonographer:    Johny Chess RDCS Referring Phys: [place] Comments: Patient is obese. Image acquisition challenging due to patient body habitus and coughing. IMPRESSIONS  1. Left ventricular ejection fraction, by estimation, is 45 to 50%. The left ventricle has mildly decreased function. The left ventricle demonstrates global hypokinesis. Left ventricular diastolic function could not be evaluated.  2. Right ventricular systolic function is normal. The right ventricular size is normal. There is mildly elevated pulmonary artery systolic pressure. The estimated right ventricular systolic pressure is 28.7 mmHg.  3. Left atrial size was severely dilated.  4. Right atrial size was moderately dilated.  5. The mitral valve is rheumatic. Mild to moderate mitral valve regurgitation. Moderate to severe mitral stenosis. The mean mitral valve gradient is 10.2 mmHg with average heart rate of 80 bpm. Moderate mitral annular calcification.  6. Low flow low gradient moderate aortic stenosis. The aortic valve is tricuspid. Aortic valve regurgitation is mild to moderate. Moderate aortic valve stenosis.  7. The inferior vena cava is normal in size with greater than 50% respiratory variability, suggesting right atrial pressure of 3 mmHg. Comparison(s): No significant change from prior study. Prior images  reviewed side by side. FINDINGS  Left Ventricle: Left ventricular ejection fraction, by  estimation, is 45 to 50%. The left ventricle has mildly decreased function. The left ventricle demonstrates global hypokinesis. The left ventricular internal cavity size was normal in size. There is  borderline concentric left ventricular hypertrophy. Left ventricular diastolic function could not be evaluated due to atrial fibrillation. Left ventricular diastolic function could not be evaluated. Right Ventricle: The right ventricular size is normal. No increase in right ventricular wall thickness. Right ventricular systolic function is normal. There is mildly elevated pulmonary artery systolic pressure. The tricuspid regurgitant velocity is 3.10  m/s, and with an assumed right atrial pressure of 3 mmHg, the estimated right ventricular systolic pressure is 74.2 mmHg. Left Atrium: Left atrial size was severely dilated. Right Atrium: Right atrial size was moderately dilated. Pericardium: There is no evidence of pericardial effusion. Mitral Valve: The mitral valve is rheumatic. There is severe thickening of the anterior and posterior mitral valve leaflet(s). There is moderate calcification of the mitral valve leaflet(s). Severely decreased mobility of the mitral valve leaflets. Moderate mitral annular calcification. Mild to moderate mitral valve regurgitation, with centrally-directed jet. Moderate to severe mitral valve stenosis. Mitral valve area by planimetry is 1.03 cm, and by the pressure half-time method is calculated to be 1.01 cm. The mean transmitral gradient is 10.2 mmHg. MV peak gradient, 22.8 mmHg. The mean mitral valve gradient is 10.2 mmHg with average heart rate of 80 bpm. Tricuspid Valve: The tricuspid valve is normal in structure. Tricuspid valve regurgitation is mild. Aortic Valve: Low flow low gradient moderate aortic stenosis. The aortic valve is tricuspid. Aortic valve regurgitation is mild to moderate. Moderate aortic stenosis is present. Aortic valve mean gradient measures 10.6 mmHg. Aortic valve  peak gradient measures 19.1 mmHg. Aortic valve area, by VTI measures 1.06 cm. Pulmonic Valve: The pulmonic valve was grossly normal. Pulmonic valve regurgitation is not visualized. Aorta: The aortic root and ascending aorta are structurally normal, with no evidence of dilitation. Venous: The inferior vena cava is normal in size with greater than 50% respiratory variability, suggesting right atrial pressure of 3 mmHg. IAS/Shunts: There is right bowing of the interatrial septum, suggestive of elevated left atrial pressure. No atrial level shunt detected by color flow Doppler.  LEFT VENTRICLE PLAX 2D LVIDd:         3.90 cm LVIDs:         3.00 cm LV PW:         1.20 cm LV IVS:        1.00 cm LVOT diam:     1.90 cm LV SV:         51 LV SV Index:   23 LVOT Area:     2.84 cm  RIGHT VENTRICLE          IVC RV Basal diam:  2.80 cm  IVC diam: 1.80 cm LEFT ATRIUM              Index         RIGHT ATRIUM           Index LA diam:        5.90 cm  2.64 cm/m    RA Area:     23.50 cm LA Vol (A2C):   270.0 ml 120.72 ml/m  RA Volume:   61.00 ml  27.27 ml/m LA Vol (A4C):   194.0 ml 86.74 ml/m LA Biplane Vol: 231.0 ml 103.28 ml/m  AORTIC VALVE AV Area (Vmax):  1.04 cm AV Area (Vmean):   0.99 cm AV Area (VTI):     1.06 cm AV Vmax:           218.80 cm/s AV Vmean:          151.600 cm/s AV VTI:            0.476 m AV Peak Grad:      19.1 mmHg AV Mean Grad:      10.6 mmHg LVOT Vmax:         80.26 cm/s LVOT Vmean:        52.900 cm/s LVOT VTI:          0.179 m LVOT/AV VTI ratio: 0.38  AORTA Ao Root diam: 2.80 cm Ao Asc diam:  3.30 cm MITRAL VALVE                TRICUSPID VALVE MV Area (PHT):  1.01 cm    TR Peak grad:   38.4 mmHg MV Area (plan): 1.03 cm    TR Vmax:        310.00 cm/s MV Area VTI:    0.74 cm MV Peak grad:   22.8 mmHg   SHUNTS MV Mean grad:   10.2 mmHg   Systemic VTI:  0.18 m MV Vmax:        2.39 m/s    Systemic Diam: 1.90 cm MV Vmean:       150.2 cm/s MV Decel Time:  751 msec Mihai Croitoru MD Electronically signed  by Sanda Klein MD Signature Date/Time: 02/22/2022/5:53:27 PM    Final         Scheduled Meds:  FLUoxetine  20 mg Oral Daily   metoprolol tartrate  25 mg Oral BID   warfarin  1 mg Oral ONCE-1600   Warfarin - Pharmacist Dosing Inpatient   Does not apply q1600   Continuous Infusions:  cefTRIAXone (ROCEPHIN)  IV 2 g (02/23/22 1959)     LOS: 4 days     Phillips Climes, MD [facility]   To contact the attending provider between 7A-7P or the covering provider during after hours 7P-7A, please log into the web site [email or web address] and access using universal [place] password for that web site. If you do not have the password, please call the hospital operator.  02/24/2022, 3:36 PM

## 2022-02-24 NOTE — Progress Notes (Signed)
[facility] for warfarin Indication: atrial fibrillation, hx DVT/PE s/p IVC filter, + lupus anticoagulant  Allergies  Allergen Reactions   Heparin Hives    Patient attests severe allergy- rash all over, severe itching, unable to take heparin   Other Itching and Rash    "Sheets and Linens used by [facility]" Mainly while getting the heparin    Patient Measurements: Height: '5\' 5"'$  (165.1 cm) Weight: 121.1 kg (267 lb) IBW/kg (Calculated) : 57 Heparin Dosing Weight: 86.2 kg   Vital Signs: Temp: 98.2 F (36.8 C) (11/30 0800) Temp Source: Oral (11/30 0800) BP: 109/57 (11/30 0800) Pulse Rate: 65 (11/30 0800)  Labs: Recent Labs    02/22/22 0742 02/23/22 0843 02/24/22 0501  HGB 10.2* 9.7*  --   HCT 34.0* 31.0*  --   PLT 159 163  --   LABPROT 45.0* 38.2* 40.0*  INR 4.9* 3.9* 4.2*  CREATININE 0.96 0.85  --      Estimated Creatinine Clearance: 73.4 mL/min (by C-G formula based on SCr of 0.85 mg/dL).   Medical History: Past Medical History:  Diagnosis Date   (HFpEF) heart failure with preserved ejection fraction ([place])    a. 06/2008 Echo: EF 55-60%, mild LVH, triv AI, mild to mod MS, mod to sev dil LA, mildly dil RA.   Acute right MCA stroke ([place])    a. 08/02/8500 Embolic stroke treated with TPA and thrombectomy with hemorrhagic transformation; Carotids 3/12 negative for ICA stenosis.   Arthritis    Bilateral Pulmonary Emboli    a. 08/2005 - s/p IVC filter.   CHF (congestive heart failure) (HCC)    Depression    Embolus of femoral artery ([place])    a. 06/2008 s/p bilateral embolectomies.   History of DVT (deep vein thrombosis)    a. s/p IVC filter.   HIT (heparin-induced thrombocytopenia) (HCC)    HTN (hypertension)    Lupus anticoagulant disorder (HCC)    Mitral stenosis    moderate-severe 09/04/20 echo   Obesity, unspecified    Permanent atrial fibrillation ([place])    a. On chronic Coumadin - INRs followed by PCP; b. CHA2DS2VASc = 5.    Popliteal artery embolism, right ([place])    a. 01/2017 in the setting of subRx INR-->s/p embolectomy.    Medications:  Scheduled:   FLUoxetine  20 mg Oral Daily   metoprolol tartrate  25 mg Oral BID   Warfarin - Pharmacist Dosing Inpatient   Does not apply q1600    Assessment: 76 yo F presenting with cough, congestion and weakness for 5 days.  Found to have RSV + Ecoli UTI.  Abx to finish 11/30.  Patient on warfarin PTA 6 mg daily except 4.5 mg TThSat.  Last outpatient INR on this regimen elevated at 5.4.   INR supratherapeutic, with bump today after increased warfarin dose.  Will continue lower dose to prevent subtherapeutic INR given significant clot risk.  Goal of Therapy:  INR 2.5-3.5 Monitor platelets by anticoagulation protocol: Yes   Plan:  Warfarin 1 mg PO x 1 today Daily INR, s/s bleeding  [employer], Pharm.D., BCPS Clinical Pharmacist Clinical phone for 02/24/2022 from 7:30-3:00 is [phone].  **Pharmacist phone directory can be found on [email or web address] listed under [place].  02/24/2022 9:21 AM

## 2022-02-24 NOTE — Care Management Important Message (Signed)
Important Message  Patient Details  Name: [name] MRN: [id number] Date of Birth: 06/01/1945   Medicare Important Message Given:  Yes  Called the patient in her Room to let her know that I have an important Message from Medicare Patient would like me to send to her home address   Orbie Pyo 02/24/2022, 2:27 PM

## 2022-02-24 NOTE — Progress Notes (Signed)
PT Cancellation Note  Patient Details Name: [name] MRN: [id number] DOB: Nov 29, 1945   Cancelled Treatment:    Reason Eval/Treat Not Completed: Other (comment).  Pt is getting up to chair for her meal and will reattempt at another time.   Ramond Dial 02/24/2022, 2:04 PM  Mee Hives, PT PhD Acute Rehab Dept. Number: [place] [facility] [place]

## 2022-02-24 NOTE — Plan of Care (Signed)

## 2022-02-25 DIAGNOSIS — N3000 Acute cystitis without hematuria: Secondary | ICD-10-CM | POA: Diagnosis not present

## 2022-02-25 DIAGNOSIS — R531 Weakness: Secondary | ICD-10-CM

## 2022-02-25 DIAGNOSIS — N179 Acute kidney failure, unspecified: Secondary | ICD-10-CM | POA: Diagnosis not present

## 2022-02-25 LAB — BASIC METABOLIC PANEL
Anion gap: 4 — ABNORMAL LOW (ref 5–15)
BUN: 10 mg/dL (ref 8–23)
CO2: 32 mmol/L (ref 22–32)
Calcium: 8.1 mg/dL — ABNORMAL LOW (ref 8.9–10.3)
Chloride: 103 mmol/L (ref 98–111)
Creatinine, Ser: 0.78 mg/dL (ref 0.44–1.00)
GFR, Estimated: >60 mL/min (ref >60)
Glucose, Bld: 115 mg/dL — ABNORMAL HIGH (ref 70–99)
Potassium: 4 mmol/L (ref 3.5–5.1)
Sodium: 139 mmol/L (ref 135–145)

## 2022-02-25 LAB — PROTIME-INR
INR: 4 — ABNORMAL HIGH (ref 0.8–1.2)
Prothrombin Time: 38.7 seconds — ABNORMAL HIGH (ref 11.4–15.2)

## 2022-02-25 LAB — CBC
HCT: 30.4 % — ABNORMAL LOW (ref 36.0–46.0)
Hemoglobin: 9.4 g/dL — ABNORMAL LOW (ref 12.0–15.0)
MCH: 32.9 pg (ref 26.0–34.0)
MCHC: 30.9 g/dL (ref 30.0–36.0)
MCV: 106.3 fL — ABNORMAL HIGH (ref 80.0–100.0)
Platelets: 170 10*3/uL (ref 150–400)
RBC: 2.86 MIL/uL — ABNORMAL LOW (ref 3.87–5.11)
RDW: 13.9 % (ref 11.5–15.5)
WBC: 12.8 10*3/uL — ABNORMAL HIGH (ref 4.0–10.5)
nRBC: 0 % (ref 0.0–0.2)

## 2022-02-25 LAB — PHOSPHORUS: Phosphorus: 2.5 mg/dL (ref 2.5–4.6)

## 2022-02-25 MED ORDER — FUROSEMIDE 40 MG PO TABS: 40.0000 mg | ORAL_TABLET | Freq: Every day | ORAL | 0 refills | Status: DC

## 2022-02-25 MED ORDER — WARFARIN 0.5 MG HALF TABLET: 0.5000 mg | ORAL_TABLET | Freq: Once | ORAL | Status: DC

## 2022-02-25 MED ORDER — WARFARIN SODIUM 3 MG PO TABS: ORAL_TABLET | ORAL | 3 refills | Status: DC

## 2022-02-25 MED ORDER — GUAIFENESIN ER 600 MG PO TB12: 600.0000 mg | ORAL_TABLET | Freq: Two times a day (BID) | ORAL | 0 refills | Status: AC

## 2022-02-25 NOTE — TOC Transition Note (Signed)
Transition of Care [facility]) - CM/SW Discharge Note   Patient Details  Name: MARESA MORASH MRN: [id number] Date of Birth: 05/24/1945  Transition of Care [facility]) CM/SW Contact:  Verdell Carmine, RN Phone Number: 02/25/2022, 2:15 PM   Clinical Narrative:    Spoke to patient and daughter at the bedside, outlined Home health ( assessments, make a plan with you about visits) patient wishes to have a Bedside Commode. Discussed with patient that [employer] will let them know if not covered, and they can always pay out of pocket.  Left message with Cyril Mourning at [employer].    Final next level of care: [place] Barriers to Discharge: No Barriers Identified   Patient Goals and CMS Choice Patient states their goals for this hospitalization and ongoing recovery are:: return home CMS Medicare.gov Compare Post Acute Care list provided to:: Patient Choice offered to / list presented to : Patient  Discharge Placement                       Discharge Plan and Services   Discharge Planning Services: CM Consult Post Acute Care Choice: Home Health          DME Arranged: Bedside commode DME Agency: [employer] Date DME Agency Contacted: 02/25/22 Time DME Agency Contacted: [phone]   [place] Arranged: PT, OT HH Agency: [place] ([place]) Date [place]: 02/23/22 Time [place]: 1629 Representative spoke with at [place]: [place] Determinants of Health ([place]) Interventions     Readmission Risk Interventions     No data to display

## 2022-02-25 NOTE — Progress Notes (Signed)
[place] for warfarin Indication: atrial fibrillation, hx DVT/PE s/p IVC filter, + lupus anticoagulant  Allergies  Allergen Reactions   Heparin Hives    Patient attests severe allergy- rash all over, severe itching, unable to take heparin   Other Itching and Rash    "Sheets and Linens used by [facility]" Mainly while getting the heparin    Patient Measurements: Height: '5\' 5"'$  (165.1 cm) Weight: 121.1 kg (267 lb) IBW/kg (Calculated) : 57 Heparin Dosing Weight: 86.2 kg   Vital Signs: Temp: 97.7 F (36.5 C) (12/01 0105) Temp Source: Axillary (12/01 0105) BP: 138/76 (12/01 0105) Pulse Rate: 81 (12/01 0105)  Labs: Recent Labs    02/23/22 0843 02/24/22 0501 02/25/22 0511  HGB 9.7*  --  9.4*  HCT 31.0*  --  30.4*  PLT 163  --  170  LABPROT 38.2* 40.0* 38.7*  INR 3.9* 4.2* 4.0*  CREATININE 0.85  --  0.78     Estimated Creatinine Clearance: 78 mL/min (by C-G formula based on SCr of 0.78 mg/dL).   Medical History: Past Medical History:  Diagnosis Date   (HFpEF) heart failure with preserved ejection fraction ([place])    a. 06/2008 Echo: EF 55-60%, mild LVH, triv AI, mild to mod MS, mod to sev dil LA, mildly dil RA.   Acute right MCA stroke ([place])    a. 07/01/6597 Embolic stroke treated with TPA and thrombectomy with hemorrhagic transformation; Carotids 3/12 negative for ICA stenosis.   Arthritis    Bilateral Pulmonary Emboli    a. 08/2005 - s/p IVC filter.   CHF (congestive heart failure) (HCC)    Depression    Embolus of femoral artery ([place])    a. 06/2008 s/p bilateral embolectomies.   History of DVT (deep vein thrombosis)    a. s/p IVC filter.   HIT (heparin-induced thrombocytopenia) (HCC)    HTN (hypertension)    Lupus anticoagulant disorder (HCC)    Mitral stenosis    moderate-severe 09/04/20 echo   Obesity, unspecified    Permanent atrial fibrillation ([place])    a. On chronic Coumadin - INRs followed by PCP; b. CHA2DS2VASc = 5.    Popliteal artery embolism, right ([place])    a. 01/2017 in the setting of subRx INR-->s/p embolectomy.    Medications:  Scheduled:   FLUoxetine  20 mg Oral Daily   metoprolol tartrate  25 mg Oral BID   Warfarin - Pharmacist Dosing Inpatient   Does not apply q1600    Assessment: 76 yo F presenting with cough, congestion and weakness for 5 days.  Found to have RSV + Ecoli UTI.  Abx to finish 11/30.  Patient on warfarin PTA 6 mg daily except 4.5 mg TThSat.  Last outpatient INR on this regimen elevated at 5.4.   INR supratherapeutic, but trending down  Goal of Therapy:  INR 2.5-3.5 Monitor platelets by anticoagulation protocol: Yes   Plan:  Warfarin 0.5 mg PO x 1 today Daily INR, s/s bleeding  Thank you Anette Guarneri, PharmD  **Pharmacist phone directory can be found on [email or web address] listed under [place].  02/25/2022 7:47 AM

## 2022-02-25 NOTE — Plan of Care (Signed)

## 2022-02-25 NOTE — Discharge Summary (Signed)
Physician Discharge Summary  [name] SFS:[id number] DOB: [birth year] DOA: 02/20/2022  PCP: Josetta Huddle, MD  Admit date: 02/20/2022 Discharge date: 02/25/2022  Admitted From: (Home) Disposition:  (Home)  Recommendations for Outpatient Follow-up:  Follow up with PCP in 1-2 weeks Please obtain BMP/CBC in one week  Home Health: (YES)   Discharge Condition: (Stable) Diet recommendation: Heart Healthy  Brief/Interim Summary:  [name] is a 76 y.o. 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 In the ED temperature 99.35F, rapid AFib with rates 120s-140s confirmed by ECG, tachypneic at 20/min. Neutrophilic leukocytosis (WBC 13.8k; 11.8k PMNs), macrocytosis with hgb 14, MVC 104.6, hypokalemia (K 3.3) and hypophosphatemia (phos level 2), supratherapeutic INR (6.8). CXR revealed diffuse interstitial opacities without focal airspace infiltrate. UA positive, though the patient denies urinary symptoms. Covid, influenza PCR negative, full respiratory viral panel, significant for RSV, as well she had UTI, she reports dysuria, positive urine cultures.      Sepsis POA  and acute hypoxic respiratory failure due to multifocal/interstitial pneumonia>> RSV vial pneumonia -Initially she was started on IV Rocephin and azithromycin to treat pneumonia, panel significant for RSV, so azithromycin has been discontinued as no evidence of bacterial pneumonia . -She was requiring 2 L initially, this all has resolved, this morning she is saturating 95% on room air . -Was instructed to check her incentive spirometer and flutter valve with her to keep using it . -Physiology of sepsis has resolved at  time of discharge .     UTI -Patient with positive UA, endorses pyuria, urine culture growing E. coli, pansensitive, treated with IV Rocephin, no indication for further antibiotics at time of discharge .    Valvular AFib with RVR:  -[place] heart rate is controlled, continue with home dose metoprolol, continue with warfarin   Chronic systolic CHF -EF around 34%, repeat echo this admission EF remained stable around 45 to 50%.   - last echo and rheumatic mod-severe MS, mild MR, will recheck echocardiogram. If LVEF down (was 50% per cardiologist, Dr. Percival Spanish). -She is initially dry on presentation, her Lasix has been held, she is currently euvolemic, to resume   AKI: - Improved with IVF, her ARB has been held, renal function back to baseline.     Supratherapeutic INR: -  No bleeding, at high risk of clots if reversed, so allowing to trend down.  INR is 4 at time of discharge, so warfarin to be resumed tomorrow at home .   History of HIT, lupus anticoagulant, DVT, PE:  - on anticoagulation.   Macrocytic anemia: D56, folic acid, iron panel largely wnl. Isolated low TIBC of uncertain significance. - Drop in hgb suspected to be dilutional, no bleeding noted.   Hypokalemia: Resolved.    Hypophosphatemia: Resolved with supplementation   Depression:  - continue with home meds   History of CVA: No new deficits.   Morbid obesity: Body mass index is 44.43 kg/m.   Discharge Diagnoses:  Principal Problem:   UTI (urinary tract infection) Active Problems:   History of CVA (cerebrovascular accident)   Essential hypertension   Chronic atrial fibrillation with RVR (HCC)   CAP (community acquired pneumonia)   Hypokalemia   Hypophosphatemia   AKI (acute  kidney injury) ([place])   Elevated INR    Discharge Instructions  Discharge Instructions     Amb referral to AFIB Clinic   Complete by: As directed    Diet - low sodium heart healthy   Complete by: As directed    Discharge  instructions   Complete by: As directed    Follow with Primary MD Josetta Huddle, MD in 7 days   Get CBC, CMP,  checked  by Primary MD next visit.    Activity: As tolerated with Full fall precautions use walker/cane & assistance as needed   Disposition Home    Diet: Heart Healthy    On your next visit with your primary care physician please Get Medicines reviewed and adjusted.   Please request your Prim.MD to go over all Hospital Tests and Procedure/Radiological results at the follow up, please get all Hospital records sent to your Prim MD by signing hospital release before you go home.   If you experience worsening of your admission symptoms, develop shortness of breath, life threatening emergency, suicidal or homicidal thoughts you must seek medical attention immediately by calling 911 or calling your MD immediately  if symptoms less severe.  You Must read complete instructions/literature along with all the possible adverse reactions/side effects for all the Medicines you take and that have been prescribed to you. Take any new Medicines after you have completely understood and accpet all the possible adverse reactions/side effects.   Do not drive, operating heavy machinery, perform activities at heights, swimming or participation in water activities or provide baby sitting services if your were admitted for syncope or siezures until you have seen by Primary MD or a Neurologist and advised to do so again.  Do not drive when taking Pain medications.    Do not take more than prescribed Pain, Sleep and Anxiety Medications  Special Instructions: If you have smoked or chewed Tobacco  in the last 2 yrs please stop smoking, stop any regular Alcohol  and or any Recreational drug use.  Wear Seat belts while driving.   Please note  You were cared for by a hospitalist during your hospital stay. If you have any questions about your discharge medications or the care you received while you  were in the hospital after you are discharged, you can call the unit and asked to speak with the hospitalist on call if the hospitalist that took care of you is not available. Once you are discharged, your primary care physician will handle any further medical issues. Please note that NO REFILLS for any discharge medications will be authorized once you are discharged, as it is imperative that you return to your primary care physician (or establish a relationship with a primary care physician if you do not have one) for your aftercare needs so that they can reassess your need for medications and monitor your lab values.   Increase activity slowly   Complete by: As directed       Allergies as of 02/25/2022       Reactions   Heparin Hives   Patient attests severe allergy- rash all over, severe itching, unable to take heparin   Other Itching, Rash   "Sheets and Linens used by Zac[facility] Pontes" Mainly while getting the heparin        Medication List     TAKE these medications    aspirin 81 MG tablet Take 81 mg by mouth daily.   CALCIUM 600 + D PO Take 1 tablet by  mouth 2 (two) times daily.   FLUoxetine 20 MG capsule Commonly known as: PROZAC Take 20 mg by mouth daily.   furosemide 40 MG tablet Commonly known as: LASIX Take 1 tablet (40 mg total) by mouth daily. Start taking on: February 28, 2022 What changed: These instructions start on February 28, 2022. If you are unsure what to do until then, ask your doctor or other care provider.   losartan 100 MG tablet Commonly known as: COZAAR Take 1 tablet (100 mg total) by mouth daily.   metoprolol tartrate 25 MG tablet Commonly known as: LOPRESSOR Take 1 tablet by mouth twice daily   potassium chloride 10 MEQ tablet Commonly known as: KLOR-CON TAKE 1 TABLET BY MOUTH ONCE DAILY   Vitamin D 50 MCG (2000 UT) tablet Take 2,000 Units by mouth daily.   warfarin 3 MG tablet Commonly known as: COUMADIN Take as directed. If you are unsure  how to take this medication, talk to your nurse or doctor. Original instructions: Hold warfarin today and start tomorrow on 02/26/2022 Start taking on: February 26, 2022 What changed: See the new instructions.        Follow-up [place] Follow up.   Why: Home health has been arranged. They will contact you to schedule apt. Contact information: [address] [place] [address] [phone]                Allergies  Allergen Reactions   Heparin Hives    Patient attests severe allergy- rash all over, severe itching, unable to take heparin   Other Itching and Rash    "Sheets and Linens used by Zacarias Pontes" Mainly while getting the heparin    Consultations: None   Procedures/Studies: DG CHEST PORT 1 VIEW  Result Date: 02/23/2022 CLINICAL DATA:  [id number] with acute respiratory failure with hypoxia. EXAM: PORTABLE CHEST 1 VIEW COMPARISON:  AP lateral 02/20/2022 FINDINGS: The heart is moderately enlarged. There is mild central vascular distension without overt findings of edema. No pleural effusion is seen. There is calcification of the transverse aorta with unremarkable mediastinum. The lungs are clear of infiltrates. IMPRESSION: 1. Cardiomegaly with mild central vascular congestion but no overt findings of edema or pneumonia. 2. Aortic atherosclerosis. Electronically Signed   By: Telford Nab M.D.   On: 02/23/2022 06:15   ECHOCARDIOGRAM COMPLETE  Result Date: 02/22/2022    ECHOCARDIOGRAM REPORT   Patient Name:   [name] Date of Exam: 02/22/2022 Medical Rec #:  [id number]       Height:       65.0 in Accession #:    [id number]      Weight:       267.0 lb Date of Birth:  [birth year]       BSA:          2.237 m Patient Age:    13 years        BP:           131/73 mmHg Patient Gender: F               HR:           86 bpm. Exam Location:  Inpatient Procedure: 2D Echo, Cardiac Doppler and Color Doppler Indications:    atrial  fibrillation. cardiomegaly. mitral valve disorder.  History:        Patient has prior history of Echocardiogram examinations, most  recent 03/30/2021. History of stroke, Arrythmias:Atrial                 Fibrillation; Risk Factors:Hypertension.  Sonographer:    Johny Chess RDCS Referring Phys: [place] Comments: Patient is obese. Image acquisition challenging due to patient body habitus and coughing. IMPRESSIONS  1. Left ventricular ejection fraction, by estimation, is 45 to 50%. The left ventricle has mildly decreased function. The left ventricle demonstrates global hypokinesis. Left ventricular diastolic function could not be evaluated.  2. Right ventricular systolic function is normal. The right ventricular size is normal. There is mildly elevated pulmonary artery systolic pressure. The estimated right ventricular systolic pressure is 01.7 mmHg.  3. Left atrial size was severely dilated.  4. Right atrial size was moderately dilated.  5. The mitral valve is rheumatic. Mild to moderate mitral valve regurgitation. Moderate to severe mitral stenosis. The mean mitral valve gradient is 10.2 mmHg with average heart rate of 80 bpm. Moderate mitral annular calcification.  6. Low flow low gradient moderate aortic stenosis. The aortic valve is tricuspid. Aortic valve regurgitation is mild to moderate. Moderate aortic valve stenosis.  7. The inferior vena cava is normal in size with greater than 50% respiratory variability, suggesting right atrial pressure of 3 mmHg. Comparison(s): No significant change from prior study. Prior images reviewed side by side. FINDINGS  Left Ventricle: Left ventricular ejection fraction, by estimation, is 45 to 50%. The left ventricle has mildly decreased function. The left ventricle demonstrates global hypokinesis. The left ventricular internal cavity size was normal in size. There is  borderline concentric left ventricular hypertrophy. Left ventricular  diastolic function could not be evaluated due to atrial fibrillation. Left ventricular diastolic function could not be evaluated. Right Ventricle: The right ventricular size is normal. No increase in right ventricular wall thickness. Right ventricular systolic function is normal. There is mildly elevated pulmonary artery systolic pressure. The tricuspid regurgitant velocity is 3.10  m/s, and with an assumed right atrial pressure of 3 mmHg, the estimated right ventricular systolic pressure is 49.4 mmHg. Left Atrium: Left atrial size was severely dilated. Right Atrium: Right atrial size was moderately dilated. Pericardium: There is no evidence of pericardial effusion. Mitral Valve: The mitral valve is rheumatic. There is severe thickening of the anterior and posterior mitral valve leaflet(s). There is moderate calcification of the mitral valve leaflet(s). Severely decreased mobility of the mitral valve leaflets. Moderate mitral annular calcification. Mild to moderate mitral valve regurgitation, with centrally-directed jet. Moderate to severe mitral valve stenosis. Mitral valve area by planimetry is 1.03 cm, and by the pressure half-time method is calculated to be 1.01 cm. The mean transmitral gradient is 10.2 mmHg. MV peak gradient, 22.8 mmHg. The mean mitral valve gradient is 10.2 mmHg with average heart rate of 80 bpm. Tricuspid Valve: The tricuspid valve is normal in structure. Tricuspid valve regurgitation is mild. Aortic Valve: Low flow low gradient moderate aortic stenosis. The aortic valve is tricuspid. Aortic valve regurgitation is mild to moderate. Moderate aortic stenosis is present. Aortic valve mean gradient measures 10.6 mmHg. Aortic valve peak gradient measures 19.1 mmHg. Aortic valve area, by VTI measures 1.06 cm. Pulmonic Valve: The pulmonic valve was grossly normal. Pulmonic valve regurgitation is not visualized. Aorta: The aortic root and ascending aorta are structurally normal, with no evidence  of dilitation. Venous: The inferior vena cava is normal in size with greater than 50% respiratory variability, suggesting right atrial pressure of 3 mmHg. IAS/Shunts: There is right  bowing of the interatrial septum, suggestive of elevated left atrial pressure. No atrial level shunt detected by color flow Doppler.  LEFT VENTRICLE PLAX 2D LVIDd:         3.90 cm LVIDs:         3.00 cm LV PW:         1.20 cm LV IVS:        1.00 cm LVOT diam:     1.90 cm LV SV:         51 LV SV Index:   23 LVOT Area:     2.84 cm  RIGHT VENTRICLE          IVC RV Basal diam:  2.80 cm  IVC diam: 1.80 cm LEFT ATRIUM              Index         RIGHT ATRIUM           Index LA diam:        5.90 cm  2.64 cm/m    RA Area:     23.50 cm LA Vol (A2C):   270.0 ml 120.72 ml/m  RA Volume:   61.00 ml  27.27 ml/m LA Vol (A4C):   194.0 ml 86.74 ml/m LA Biplane Vol: 231.0 ml 103.28 ml/m  AORTIC VALVE AV Area (Vmax):    1.04 cm AV Area (Vmean):   0.99 cm AV Area (VTI):     1.06 cm AV Vmax:           218.80 cm/s AV Vmean:          151.600 cm/s AV VTI:            0.476 m AV Peak Grad:      19.1 mmHg AV Mean Grad:      10.6 mmHg LVOT Vmax:         80.26 cm/s LVOT Vmean:        52.900 cm/s LVOT VTI:          0.179 m LVOT/AV VTI ratio: 0.38  AORTA Ao Root diam: 2.80 cm Ao Asc diam:  3.30 cm MITRAL VALVE                TRICUSPID VALVE MV Area (PHT):  1.01 cm    TR Peak grad:   38.4 mmHg MV Area (plan): 1.03 cm    TR Vmax:        310.00 cm/s MV Area VTI:    0.74 cm MV Peak grad:   22.8 mmHg   SHUNTS MV Mean grad:   10.2 mmHg   Systemic VTI:  0.18 m MV Vmax:        2.39 m/s    Systemic Diam: 1.90 cm MV Vmean:       150.2 cm/s MV Decel Time:  751 msec Mihai Croitoru MD Electronically signed by Sanda Klein MD Signature Date/Time: 02/22/2022/5:53:27 PM    Final    DG Chest 2 View  Result Date: 02/20/2022 CLINICAL DATA:  Shortness of breath, weakness for 5 days, cough EXAM: CHEST - 2 VIEW COMPARISON:  12/18/2020 FINDINGS: Cardiomegaly. Mild,  diffuse interstitial pulmonary opacity. Disc degenerative disease of the thoracic spine. IMPRESSION: Cardiomegaly with mild, diffuse interstitial pulmonary opacity, consistent with edema or atypical/viral infection. No focal airspace opacity. Electronically Signed   By: Delanna Ahmadi M.D.   On: 02/20/2022 17:08      Subjective:  She reports generalized weakness, fatigue, she reports cough is improving.  Discharge Exam:  Vitals:   02/25/22 0850 02/25/22 0951  BP:    Pulse: 82 78  Resp:  19  Temp:    SpO2:  95%   Vitals:   02/25/22 0800 02/25/22 0820 02/25/22 0850 02/25/22 0951  BP: (!) 146/90     Pulse: 73 79 82 78  Resp: '19 19  19  '$ Temp: 98 F (36.7 C)     TempSrc: Axillary     SpO2: 99% 99%  95%  Weight:      Height:        General: Pt is alert, awake, not in acute distress Cardiovascular: RRR, S1/S2 +, no rubs, no gallops Respiratory: CTA bilaterally, no wheezing, no rhonchi Abdominal: Soft, NT, ND, bowel sounds + Extremities: no edema, no cyanosis    The results of significant diagnostics from this hospitalization (including imaging, microbiology, ancillary and laboratory) are listed below for reference.     Microbiology: Recent Results (from the past 240 hour(s))  Resp Panel by RT-PCR (Flu A&B, Covid) Anterior Nasal Swab     Status: None   Collection Time: 02/20/22  4:54 PM   Specimen: Anterior Nasal Swab  Result Value Ref Range Status   SARS Coronavirus 2 by RT PCR NEGATIVE NEGATIVE Final    Comment: (NOTE) SARS-CoV-2 target nucleic acids are NOT DETECTED.  The SARS-CoV-2 RNA is generally detectable in upper respiratory specimens during the acute phase of infection. The lowest concentration of SARS-CoV-2 viral copies this assay can detect is 138 copies/mL. A negative result does not preclude SARS-Cov-2 infection and should not be used as the sole basis for treatment or other patient management decisions. A negative result may occur with  improper specimen  collection/handling, submission of specimen other than nasopharyngeal swab, presence of viral mutation(s) within the areas targeted by this assay, and inadequate number of viral copies(<138 copies/mL). A negative result must be combined with clinical observations, patient history, and epidemiological information. The expected result is Negative.  Fact Sheet for Patients:  EntrepreneurPulse.com.au  Fact Sheet for Healthcare Providers:  IncredibleEmployment.be  This test is no t yet approved or cleared by the [place] FDA and  has been authorized for detection and/or diagnosis of SARS-CoV-2 by FDA under an Emergency Use Authorization (EUA). This EUA will remain  in effect (meaning this test can be used) for the duration of the COVID-19 declaration under Section 564(b)(1) of the Act, 21 U.S.C.section 360bbb-3(b)(1), unless the authorization is terminated  or revoked sooner.       Influenza A by PCR NEGATIVE NEGATIVE Final   Influenza B by PCR NEGATIVE NEGATIVE Final    Comment: (NOTE) The Xpert Xpress SARS-CoV-2/FLU/RSV plus assay is intended as an aid in the diagnosis of influenza from Nasopharyngeal swab specimens and should not be used as a sole basis for treatment. Nasal washings and aspirates are unacceptable for Xpert Xpress SARS-CoV-2/FLU/RSV testing.  Fact Sheet for Patients: EntrepreneurPulse.com.au  Fact Sheet for Healthcare Providers: IncredibleEmployment.be  This test is not yet approved or cleared by the [place] FDA and has been authorized for detection and/or diagnosis of SARS-CoV-2 by FDA under an Emergency Use Authorization (EUA). This EUA will remain in effect (meaning this test can be used) for the duration of the COVID-19 declaration under Section 564(b)(1) of the Act, 21 U.S.C. section 360bbb-3(b)(1), unless the authorization is terminated or revoked.  Performed at [facility], [place] [address] [place], [place] [address]   Urine Culture     Status: Abnormal   Collection  Time: 02/20/22 10:24 PM   Specimen: Urine, Clean Catch  Result Value Ref Range Status   Specimen Description URINE, CLEAN CATCH  Final   Special Requests   Final    NONE Performed at [facility], [address], [place], [place] [address]    Culture >=100,000 COLONIES/mL ESCHERICHIA COLI (A)  Final   Report Status 02/23/2022 FINAL  Final   Organism ID, Bacteria ESCHERICHIA COLI (A)  Final      Susceptibility   Escherichia coli - MIC*    AMPICILLIN >=32 RESISTANT Resistant     CEFAZOLIN 8 SENSITIVE Sensitive     CEFEPIME <=0.12 SENSITIVE Sensitive     CEFTRIAXONE <=0.25 SENSITIVE Sensitive     CIPROFLOXACIN <=0.25 SENSITIVE Sensitive     GENTAMICIN <=1 SENSITIVE Sensitive     IMIPENEM <=0.25 SENSITIVE Sensitive     NITROFURANTOIN <=16 SENSITIVE Sensitive     TRIMETH/SULFA <=20 SENSITIVE Sensitive     AMPICILLIN/SULBACTAM >=32 RESISTANT Resistant     PIP/TAZO <=4 SENSITIVE Sensitive     * >=100,000 COLONIES/mL ESCHERICHIA COLI  Respiratory (~20 pathogens) panel by PCR     Status: Abnormal   Collection Time: 02/22/22  6:27 AM   Specimen: Nasopharyngeal Swab; Respiratory  Result Value Ref Range Status   Adenovirus NOT DETECTED NOT DETECTED Final   Coronavirus 229E NOT DETECTED NOT DETECTED Final    Comment: (NOTE) The Coronavirus on the Respiratory Panel, DOES NOT test for the novel  Coronavirus (2019 nCoV)    Coronavirus HKU1 NOT DETECTED NOT DETECTED Final   Coronavirus NL63 NOT DETECTED NOT DETECTED Final   Coronavirus OC43 NOT DETECTED NOT DETECTED Final   Metapneumovirus NOT DETECTED NOT DETECTED Final   Rhinovirus / Enterovirus NOT DETECTED NOT DETECTED Final   Influenza A NOT DETECTED NOT DETECTED Final   Influenza B NOT DETECTED NOT DETECTED Final   Parainfluenza Virus 1 NOT DETECTED NOT DETECTED Final   Parainfluenza Virus 2 NOT DETECTED NOT DETECTED  Final   Parainfluenza Virus 3 NOT DETECTED NOT DETECTED Final   Parainfluenza Virus 4 NOT DETECTED NOT DETECTED Final   Respiratory Syncytial Virus DETECTED (A) NOT DETECTED Final   Bordetella pertussis NOT DETECTED NOT DETECTED Final   Bordetella Parapertussis NOT DETECTED NOT DETECTED Final   Chlamydophila pneumoniae NOT DETECTED NOT DETECTED Final   Mycoplasma pneumoniae NOT DETECTED NOT DETECTED Final    Comment: Performed at [facility], [place] [address], [place], [place] [address]     Labs: BNP (last 3 results) Recent Labs    02/20/22 2311  BNP 242.3*   Basic Metabolic Panel: Recent Labs  Lab 02/20/22 1620 02/21/22 0700 02/22/22 0742 02/23/22 0843 02/25/22 0511  NA 141 142 142 144 139  K 3.3* 4.3 4.1 3.9 4.0  CL 102 107 107 106 103  CO2 '24 25 30 '$ 32 32  GLUCOSE 183* 144* 115* 107* 115*  BUN 42* 33* '20 14 10  '$ CREATININE 1.64* 1.13* 0.96 0.85 0.78  CALCIUM 9.0 8.3* 8.3* 8.3* 8.1*  MG 2.2  --   --   --   --   PHOS 2.0* 3.8 3.3  --  2.5   Liver Function Tests: Recent Labs  Lab 02/20/22 1620  AST 21  ALT 15  ALKPHOS 74  BILITOT 1.4*  PROT 7.2  ALBUMIN 2.8*   No results for input(s): "LIPASE", "AMYLASE" in the last 168 hours. No results for input(s): "AMMONIA" in the last 168 hours. CBC: Recent Labs  Lab  02/20/22 1620 02/21/22 0700 02/22/22 0742 02/23/22 0843 02/25/22 0511  WBC 13.8* 14.7* 12.7* 12.0* 12.8*  NEUTROABS 11.3* 11.3* 9.9*  --   --   HGB 14.0 11.3* 10.2* 9.7* 9.4*  HCT 42.9 35.2* 34.0* 31.0* 30.4*  MCV 104.6* 105.1* 109.0* 107.3* 106.3*  PLT 196 172 159 163 170   Cardiac Enzymes: No results for input(s): "CKTOTAL", "CKMB", "CKMBINDEX", "TROPONINI" in the last 168 hours. BNP: Invalid input(s): "POCBNP" CBG: No results for input(s): "GLUCAP" in the last 168 hours. D-Dimer No results for input(s): "DDIMER" in the last 72 hours. Hgb A1c No results for input(s): "HGBA1C" in the last 72 hours. Lipid Profile No results for input(s):  "CHOL", "HDL", "LDLCALC", "TRIG", "CHOLHDL", "LDLDIRECT" in the last 72 hours. Thyroid function studies No results for input(s): "TSH", "T4TOTAL", "T3FREE", "THYROIDAB" in the last 72 hours.  Invalid input(s): "FREET3" Anemia work up No results for input(s): "VITAMINB12", "FOLATE", "FERRITIN", "TIBC", "IRON", "RETICCTPCT" in the last 72 hours. Urinalysis    Component Value Date/Time   COLORURINE AMBER (A) 02/20/2022 2030   APPEARANCEUR HAZY (A) 02/20/2022 2030   LABSPEC 1.026 02/20/2022 2030   PHURINE 5.0 02/20/2022 2030   GLUCOSEU NEGATIVE 02/20/2022 2030   HGBUR MODERATE (A) 02/20/2022 2030   BILIRUBINUR NEGATIVE 02/20/2022 2030   KETONESUR NEGATIVE 02/20/2022 2030   PROTEINUR 100 (A) 02/20/2022 2030   UROBILINOGEN 1.0 07/07/2010 1500   NITRITE POSITIVE (A) 02/20/2022 2030   LEUKOCYTESUR NEGATIVE 02/20/2022 2030   Sepsis Labs Recent Labs  Lab 02/21/22 0700 02/22/22 0742 02/23/22 0843 02/25/22 0511  WBC 14.7* 12.7* 12.0* 12.8*   Microbiology Recent Results (from the past 240 hour(s))  Resp Panel by RT-PCR (Flu A&B, Covid) Anterior Nasal Swab     Status: None   Collection Time: 02/20/22  4:54 PM   Specimen: Anterior Nasal Swab  Result Value Ref Range Status   SARS Coronavirus 2 by RT PCR NEGATIVE NEGATIVE Final    Comment: (NOTE) SARS-CoV-2 target nucleic acids are NOT DETECTED.  The SARS-CoV-2 RNA is generally detectable in upper respiratory specimens during the acute phase of infection. The lowest concentration of SARS-CoV-2 viral copies this assay can detect is 138 copies/mL. A negative result does not preclude SARS-Cov-2 infection and should not be used as the sole basis for treatment or other patient management decisions. A negative result may occur with  improper specimen collection/handling, submission of specimen other than nasopharyngeal swab, presence of viral mutation(s) within the areas targeted by this assay, and inadequate number of viral copies(<138  copies/mL). A negative result must be combined with clinical observations, patient history, and epidemiological information. The expected result is Negative.  Fact Sheet for Patients:  EntrepreneurPulse.com.au  Fact Sheet for Healthcare Providers:  IncredibleEmployment.be  This test is no t yet approved or cleared by the [place] FDA and  has been authorized for detection and/or diagnosis of SARS-CoV-2 by FDA under an Emergency Use Authorization (EUA). This EUA will remain  in effect (meaning this test can be used) for the duration of the COVID-19 declaration under Section 564(b)(1) of the Act, 21 U.S.C.section 360bbb-3(b)(1), unless the authorization is terminated  or revoked sooner.       Influenza A by PCR NEGATIVE NEGATIVE Final   Influenza B by PCR NEGATIVE NEGATIVE Final    Comment: (NOTE) The Xpert Xpress SARS-CoV-2/FLU/RSV plus assay is intended as an aid in the diagnosis of influenza from Nasopharyngeal swab specimens and should not be used as a sole basis for treatment. Nasal washings  and aspirates are unacceptable for Xpert Xpress SARS-CoV-2/FLU/RSV testing.  Fact Sheet for Patients: EntrepreneurPulse.com.au  Fact Sheet for Healthcare Providers: IncredibleEmployment.be  This test is not yet approved or cleared by the [place] FDA and has been authorized for detection and/or diagnosis of SARS-CoV-2 by FDA under an Emergency Use Authorization (EUA). This EUA will remain in effect (meaning this test can be used) for the duration of the COVID-19 declaration under Section 564(b)(1) of the Act, 21 U.S.C. section 360bbb-3(b)(1), unless the authorization is terminated or revoked.  Performed at [facility], [place] [address], [place], [place] [address]   Urine Culture     Status: Abnormal   Collection Time: 02/20/22 10:24 PM   Specimen: Urine, Clean Catch  Result Value Ref Range  Status   Specimen Description URINE, CLEAN CATCH  Final   Special Requests   Final    NONE Performed at [facility], [place] [address], [place], [place] [address]    Culture >=100,000 COLONIES/mL ESCHERICHIA COLI (A)  Final   Report Status 02/23/2022 FINAL  Final   Organism ID, Bacteria ESCHERICHIA COLI (A)  Final      Susceptibility   Escherichia coli - MIC*    AMPICILLIN >=32 RESISTANT Resistant     CEFAZOLIN 8 SENSITIVE Sensitive     CEFEPIME <=0.12 SENSITIVE Sensitive     CEFTRIAXONE <=0.25 SENSITIVE Sensitive     CIPROFLOXACIN <=0.25 SENSITIVE Sensitive     GENTAMICIN <=1 SENSITIVE Sensitive     IMIPENEM <=0.25 SENSITIVE Sensitive     NITROFURANTOIN <=16 SENSITIVE Sensitive     TRIMETH/SULFA <=20 SENSITIVE Sensitive     AMPICILLIN/SULBACTAM >=32 RESISTANT Resistant     PIP/TAZO <=4 SENSITIVE Sensitive     * >=100,000 COLONIES/mL ESCHERICHIA COLI  Respiratory (~20 pathogens) panel by PCR     Status: Abnormal   Collection Time: 02/22/22  6:27 AM   Specimen: Nasopharyngeal Swab; Respiratory  Result Value Ref Range Status   Adenovirus NOT DETECTED NOT DETECTED Final   Coronavirus 229E NOT DETECTED NOT DETECTED Final    Comment: (NOTE) The Coronavirus on the Respiratory Panel, DOES NOT test for the novel  Coronavirus (2019 nCoV)    Coronavirus HKU1 NOT DETECTED NOT DETECTED Final   Coronavirus NL63 NOT DETECTED NOT DETECTED Final   Coronavirus OC43 NOT DETECTED NOT DETECTED Final   Metapneumovirus NOT DETECTED NOT DETECTED Final   Rhinovirus / Enterovirus NOT DETECTED NOT DETECTED Final   Influenza A NOT DETECTED NOT DETECTED Final   Influenza B NOT DETECTED NOT DETECTED Final   Parainfluenza Virus 1 NOT DETECTED NOT DETECTED Final   Parainfluenza Virus 2 NOT DETECTED NOT DETECTED Final   Parainfluenza Virus 3 NOT DETECTED NOT DETECTED Final   Parainfluenza Virus 4 NOT DETECTED NOT DETECTED Final   Respiratory Syncytial Virus DETECTED (A) NOT DETECTED Final    Bordetella pertussis NOT DETECTED NOT DETECTED Final   Bordetella Parapertussis NOT DETECTED NOT DETECTED Final   Chlamydophila pneumoniae NOT DETECTED NOT DETECTED Final   Mycoplasma pneumoniae NOT DETECTED NOT DETECTED Final    Comment: Performed at [facility], [place] [address], [place],  [address]     Time coordinating discharge: Over 30 minutes  SIGNED:   Phillips Climes, MD  [facility] 02/25/2022, 10:16 AM Pager   If 7PM-7AM, please contact night-coverage [email or web address]

## 2022-02-25 NOTE — Discharge Instructions (Addendum)
Follow with Primary MD Josetta Huddle, MD in 7 days   Get CBC, CMP,  checked  by Primary MD next visit.    Activity: As tolerated with Full fall precautions use walker/cane & assistance as needed   Disposition Home    Diet: Heart Healthy    On your next visit with your primary care physician please Get Medicines reviewed and adjusted.   Please request your Prim.MD to go over all Hospital Tests and Procedure/Radiological results at the follow up, please get all Hospital records sent to your Prim MD by signing hospital release before you go home.   If you experience worsening of your admission symptoms, develop shortness of breath, life threatening emergency, suicidal or homicidal thoughts you must seek medical attention immediately by calling 911 or calling your MD immediately  if symptoms less severe.  You Must read complete instructions/literature along with all the possible adverse reactions/side effects for all the Medicines you take and that have been prescribed to you. Take any new Medicines after you have completely understood and accpet all the possible adverse reactions/side effects.   Do not drive, operating heavy machinery, perform activities at heights, swimming or participation in water activities or provide baby sitting services if your were admitted for syncope or siezures until you have seen by Primary MD or a Neurologist and advised to do so again.  Do not drive when taking Pain medications.    Do not take more than prescribed Pain, Sleep and Anxiety Medications  Special Instructions: If you have smoked or chewed Tobacco  in the last 2 yrs please stop smoking, stop any regular Alcohol  and or any Recreational drug use.  Wear Seat belts while driving.   Please note  You were cared for by a hospitalist during your hospital stay. If you have any questions about your discharge medications or the care you received while you were in the hospital after you are  discharged, you can call the unit and asked to speak with the hospitalist on call if the hospitalist that took care of you is not available. Once you are discharged, your primary care physician will handle any further medical issues. Please note that NO REFILLS for any discharge medications will be authorized once you are discharged, as it is imperative that you return to your primary care physician (or establish a relationship with a primary care physician if you do not have one) for your aftercare needs so that they can reassess your need for medications and monitor your lab values.

## 2022-02-25 NOTE — TOC Transition Note (Signed)
Transition of Care [facility]) - CM/SW Discharge Note   Patient Details  Name: [name] MRN: [id number] Date of Birth: 1946-03-08  [facility] CM/SW Contact:  Verdell Carmine, RN Phone Number: 02/25/2022, 9:59 AM   Clinical Narrative:     Patient will be DC [facility] notified of DC  No further needs identified  Final next level of care: Home w Home Health Services Barriers to Discharge: No Barriers Identified   Patient Goals and CMS Choice Patient states their goals for this hospitalization and ongoing recovery are:: return home CMS Medicare.gov Compare Post Acute Care list provided to:: Patient Choice offered to / list presented to : Patient  Discharge Placement                       Discharge Plan and Services   Discharge Planning Services: CM Consult Post Acute Care Choice: Home Health                    HH Arranged: PT, OT [facility] Agency: [place] ([facility]) Date [facility] Agency Contacted: 02/23/22 Time [place]: 1629 Representative spoke with at [place]: [place] ([place]) Interventions     Readmission Risk Interventions     No data to display

## 2022-02-25 NOTE — Progress Notes (Signed)
[place] for warfarin Indication: atrial fibrillation, hx DVT/PE s/p IVC filter, + lupus anticoagulant  Allergies  Allergen Reactions   Heparin Hives    Patient attests severe allergy- rash all over, severe itching, unable to take heparin   Other Itching and Rash    "Sheets and Linens used by Zac[facility] Pontes" Mainly while getting the heparin    Patient Measurements: Height: '5\' 5"'$  (165.1 cm) Weight: 121.1 kg (267 lb) IBW/kg (Calculated) : 57 Heparin Dosing Weight: 86.2 kg   Vital Signs: Temp: 98 F (36.7 C) (12/01 0800) Temp Source: Axillary (12/01 0800) BP: 146/90 (12/01 0800) Pulse Rate: 78 (12/01 0951)  Labs: Recent Labs    02/23/22 0843 02/24/22 0501 02/25/22 0511  HGB 9.7*  --  9.4*  HCT 31.0*  --  30.4*  PLT 163  --  170  LABPROT 38.2* 40.0* 38.7*  INR 3.9* 4.2* 4.0*  CREATININE 0.85  --  0.78     Estimated Creatinine Clearance: 78 mL/min (by C-G formula based on SCr of 0.78 mg/dL).   Medical History: Past Medical History:  Diagnosis Date   (HFpEF) heart failure with preserved ejection fraction ([place])    a. 06/2008 Echo: EF 55-60%, mild LVH, triv AI, mild to mod MS, mod to sev dil LA, mildly dil RA.   Acute right MCA stroke ([place])    a. 09/02/1273 Embolic stroke treated with TPA and thrombectomy with hemorrhagic transformation; Carotids 3/12 negative for ICA stenosis.   Arthritis    Bilateral Pulmonary Emboli    a. 08/2005 - s/p IVC filter.   CHF (congestive heart failure) (HCC)    Depression    Embolus of femoral artery ([place])    a. 06/2008 s/p bilateral embolectomies.   History of DVT (deep vein thrombosis)    a. s/p IVC filter.   HIT (heparin-induced thrombocytopenia) (HCC)    HTN (hypertension)    Lupus anticoagulant disorder (HCC)    Mitral stenosis    moderate-severe 09/04/20 echo   Obesity, unspecified    Permanent atrial fibrillation ([place])    a. On chronic Coumadin - INRs followed by PCP; b. CHA2DS2VASc = 5.    Popliteal artery embolism, right ([place])    a. 01/2017 in the setting of subRx INR-->s/p embolectomy.    Medications:  Scheduled:   FLUoxetine  20 mg Oral Daily   metoprolol tartrate  25 mg Oral BID   Warfarin - Pharmacist Dosing Inpatient   Does not apply q1600    Assessment: 76 yo F presenting with cough, congestion and weakness for 5 days.  Found to have RSV + Ecoli UTI.  Abx to finish 11/30.  Patient on warfarin PTA 6 mg daily except 4.5 mg TThSat.  Last outpatient INR on this regimen elevated at 5.4.   INR supratherapeutic, but trending down  Goal of Therapy:  INR 2.5-3.5 Monitor platelets by anticoagulation protocol: Yes   Plan:  Warfarin 0.5 mg PO x 1 today Daily INR, s/s bleeding  Thank you Anette Guarneri, PharmD  **Pharmacist phone directory can be found on [email or web address] listed under [place].  02/25/2022 10:04 AM  Discharging today -- will hold dose and resume home dose 12/2 Anette Guarneri, PharmD

## 2022-02-26 DIAGNOSIS — D6862 Lupus anticoagulant syndrome: Secondary | ICD-10-CM | POA: Diagnosis not present

## 2022-02-26 DIAGNOSIS — Z8673 Personal history of transient ischemic attack (TIA), and cerebral infarction without residual deficits: Secondary | ICD-10-CM | POA: Diagnosis not present

## 2022-02-26 DIAGNOSIS — I4821 Permanent atrial fibrillation: Secondary | ICD-10-CM | POA: Diagnosis not present

## 2022-02-26 DIAGNOSIS — Z87440 Personal history of urinary (tract) infections: Secondary | ICD-10-CM | POA: Diagnosis not present

## 2022-02-26 DIAGNOSIS — D539 Nutritional anemia, unspecified: Secondary | ICD-10-CM | POA: Diagnosis not present

## 2022-02-26 DIAGNOSIS — I050 Rheumatic mitral stenosis: Secondary | ICD-10-CM | POA: Diagnosis not present

## 2022-02-26 DIAGNOSIS — F32A Depression, unspecified: Secondary | ICD-10-CM | POA: Diagnosis not present

## 2022-02-26 DIAGNOSIS — I110 Hypertensive heart disease with heart failure: Secondary | ICD-10-CM | POA: Diagnosis not present

## 2022-02-26 DIAGNOSIS — Z6841 Body Mass Index (BMI) 40.0 and over, adult: Secondary | ICD-10-CM | POA: Diagnosis not present

## 2022-02-26 DIAGNOSIS — Z7982 Long term (current) use of aspirin: Secondary | ICD-10-CM | POA: Diagnosis not present

## 2022-02-26 DIAGNOSIS — M1990 Unspecified osteoarthritis, unspecified site: Secondary | ICD-10-CM | POA: Diagnosis not present

## 2022-02-26 DIAGNOSIS — Z86718 Personal history of other venous thrombosis and embolism: Secondary | ICD-10-CM | POA: Diagnosis not present

## 2022-02-26 DIAGNOSIS — I5022 Chronic systolic (congestive) heart failure: Secondary | ICD-10-CM | POA: Diagnosis not present

## 2022-02-26 DIAGNOSIS — Z9181 History of falling: Secondary | ICD-10-CM | POA: Diagnosis not present

## 2022-02-26 DIAGNOSIS — Z86711 Personal history of pulmonary embolism: Secondary | ICD-10-CM | POA: Diagnosis not present

## 2022-02-26 DIAGNOSIS — Z8701 Personal history of pneumonia (recurrent): Secondary | ICD-10-CM | POA: Diagnosis not present

## 2022-02-26 DIAGNOSIS — I5030 Unspecified diastolic (congestive) heart failure: Secondary | ICD-10-CM | POA: Diagnosis not present

## 2022-02-26 DIAGNOSIS — Z7901 Long term (current) use of anticoagulants: Secondary | ICD-10-CM | POA: Diagnosis not present

## 2022-03-01 DIAGNOSIS — I050 Rheumatic mitral stenosis: Secondary | ICD-10-CM | POA: Diagnosis not present

## 2022-03-01 DIAGNOSIS — I4821 Permanent atrial fibrillation: Secondary | ICD-10-CM | POA: Diagnosis not present

## 2022-03-01 DIAGNOSIS — D6862 Lupus anticoagulant syndrome: Secondary | ICD-10-CM | POA: Diagnosis not present

## 2022-03-01 DIAGNOSIS — I5030 Unspecified diastolic (congestive) heart failure: Secondary | ICD-10-CM | POA: Diagnosis not present

## 2022-03-01 DIAGNOSIS — I110 Hypertensive heart disease with heart failure: Secondary | ICD-10-CM | POA: Diagnosis not present

## 2022-03-01 DIAGNOSIS — I5022 Chronic systolic (congestive) heart failure: Secondary | ICD-10-CM | POA: Diagnosis not present

## 2022-03-02 DIAGNOSIS — I5030 Unspecified diastolic (congestive) heart failure: Secondary | ICD-10-CM | POA: Diagnosis not present

## 2022-03-02 DIAGNOSIS — I050 Rheumatic mitral stenosis: Secondary | ICD-10-CM | POA: Diagnosis not present

## 2022-03-02 DIAGNOSIS — I5022 Chronic systolic (congestive) heart failure: Secondary | ICD-10-CM | POA: Diagnosis not present

## 2022-03-02 DIAGNOSIS — I4821 Permanent atrial fibrillation: Secondary | ICD-10-CM | POA: Diagnosis not present

## 2022-03-02 DIAGNOSIS — I110 Hypertensive heart disease with heart failure: Secondary | ICD-10-CM | POA: Diagnosis not present

## 2022-03-02 DIAGNOSIS — D6862 Lupus anticoagulant syndrome: Secondary | ICD-10-CM | POA: Diagnosis not present

## 2022-03-03 DIAGNOSIS — D6859 Other primary thrombophilia: Secondary | ICD-10-CM | POA: Diagnosis not present

## 2022-03-03 DIAGNOSIS — N390 Urinary tract infection, site not specified: Secondary | ICD-10-CM | POA: Diagnosis not present

## 2022-03-03 DIAGNOSIS — B974 Respiratory syncytial virus as the cause of diseases classified elsewhere: Secondary | ICD-10-CM | POA: Diagnosis not present

## 2022-03-03 DIAGNOSIS — I4821 Permanent atrial fibrillation: Secondary | ICD-10-CM | POA: Diagnosis not present

## 2022-03-03 DIAGNOSIS — Z09 Encounter for follow-up examination after completed treatment for conditions other than malignant neoplasm: Secondary | ICD-10-CM | POA: Diagnosis not present

## 2022-03-03 DIAGNOSIS — A419 Sepsis, unspecified organism: Secondary | ICD-10-CM | POA: Diagnosis not present

## 2022-03-03 DIAGNOSIS — I4891 Unspecified atrial fibrillation: Secondary | ICD-10-CM | POA: Diagnosis not present

## 2022-03-03 DIAGNOSIS — F322 Major depressive disorder, single episode, severe without psychotic features: Secondary | ICD-10-CM | POA: Diagnosis not present

## 2022-03-04 DIAGNOSIS — D6862 Lupus anticoagulant syndrome: Secondary | ICD-10-CM | POA: Diagnosis not present

## 2022-03-04 DIAGNOSIS — I110 Hypertensive heart disease with heart failure: Secondary | ICD-10-CM | POA: Diagnosis not present

## 2022-03-04 DIAGNOSIS — I5022 Chronic systolic (congestive) heart failure: Secondary | ICD-10-CM | POA: Diagnosis not present

## 2022-03-04 DIAGNOSIS — I050 Rheumatic mitral stenosis: Secondary | ICD-10-CM | POA: Diagnosis not present

## 2022-03-04 DIAGNOSIS — I5030 Unspecified diastolic (congestive) heart failure: Secondary | ICD-10-CM | POA: Diagnosis not present

## 2022-03-04 DIAGNOSIS — I4821 Permanent atrial fibrillation: Secondary | ICD-10-CM | POA: Diagnosis not present

## 2022-03-07 DIAGNOSIS — I4821 Permanent atrial fibrillation: Secondary | ICD-10-CM | POA: Diagnosis not present

## 2022-03-07 DIAGNOSIS — D6862 Lupus anticoagulant syndrome: Secondary | ICD-10-CM | POA: Diagnosis not present

## 2022-03-07 DIAGNOSIS — I110 Hypertensive heart disease with heart failure: Secondary | ICD-10-CM | POA: Diagnosis not present

## 2022-03-07 DIAGNOSIS — I5022 Chronic systolic (congestive) heart failure: Secondary | ICD-10-CM | POA: Diagnosis not present

## 2022-03-07 DIAGNOSIS — I050 Rheumatic mitral stenosis: Secondary | ICD-10-CM | POA: Diagnosis not present

## 2022-03-07 DIAGNOSIS — I5030 Unspecified diastolic (congestive) heart failure: Secondary | ICD-10-CM | POA: Diagnosis not present

## 2022-03-08 DIAGNOSIS — I5030 Unspecified diastolic (congestive) heart failure: Secondary | ICD-10-CM | POA: Diagnosis not present

## 2022-03-08 DIAGNOSIS — I5022 Chronic systolic (congestive) heart failure: Secondary | ICD-10-CM | POA: Diagnosis not present

## 2022-03-08 DIAGNOSIS — D6862 Lupus anticoagulant syndrome: Secondary | ICD-10-CM | POA: Diagnosis not present

## 2022-03-08 DIAGNOSIS — I050 Rheumatic mitral stenosis: Secondary | ICD-10-CM | POA: Diagnosis not present

## 2022-03-08 DIAGNOSIS — I110 Hypertensive heart disease with heart failure: Secondary | ICD-10-CM | POA: Diagnosis not present

## 2022-03-08 DIAGNOSIS — I4821 Permanent atrial fibrillation: Secondary | ICD-10-CM | POA: Diagnosis not present

## 2022-03-10 DIAGNOSIS — I050 Rheumatic mitral stenosis: Secondary | ICD-10-CM | POA: Diagnosis not present

## 2022-03-10 DIAGNOSIS — I4821 Permanent atrial fibrillation: Secondary | ICD-10-CM | POA: Diagnosis not present

## 2022-03-10 DIAGNOSIS — D6862 Lupus anticoagulant syndrome: Secondary | ICD-10-CM | POA: Diagnosis not present

## 2022-03-10 DIAGNOSIS — I5022 Chronic systolic (congestive) heart failure: Secondary | ICD-10-CM | POA: Diagnosis not present

## 2022-03-10 DIAGNOSIS — I5030 Unspecified diastolic (congestive) heart failure: Secondary | ICD-10-CM | POA: Diagnosis not present

## 2022-03-10 DIAGNOSIS — I110 Hypertensive heart disease with heart failure: Secondary | ICD-10-CM | POA: Diagnosis not present

## 2022-03-16 DIAGNOSIS — I5022 Chronic systolic (congestive) heart failure: Secondary | ICD-10-CM | POA: Diagnosis not present

## 2022-03-16 DIAGNOSIS — I110 Hypertensive heart disease with heart failure: Secondary | ICD-10-CM | POA: Diagnosis not present

## 2022-03-16 DIAGNOSIS — I050 Rheumatic mitral stenosis: Secondary | ICD-10-CM | POA: Diagnosis not present

## 2022-03-16 DIAGNOSIS — I4821 Permanent atrial fibrillation: Secondary | ICD-10-CM | POA: Diagnosis not present

## 2022-03-16 DIAGNOSIS — I5030 Unspecified diastolic (congestive) heart failure: Secondary | ICD-10-CM | POA: Diagnosis not present

## 2022-03-16 DIAGNOSIS — D6862 Lupus anticoagulant syndrome: Secondary | ICD-10-CM | POA: Diagnosis not present

## 2022-03-17 DIAGNOSIS — I110 Hypertensive heart disease with heart failure: Secondary | ICD-10-CM | POA: Diagnosis not present

## 2022-03-17 DIAGNOSIS — I5022 Chronic systolic (congestive) heart failure: Secondary | ICD-10-CM | POA: Diagnosis not present

## 2022-03-17 DIAGNOSIS — D6862 Lupus anticoagulant syndrome: Secondary | ICD-10-CM | POA: Diagnosis not present

## 2022-03-17 DIAGNOSIS — I5030 Unspecified diastolic (congestive) heart failure: Secondary | ICD-10-CM | POA: Diagnosis not present

## 2022-03-17 DIAGNOSIS — I050 Rheumatic mitral stenosis: Secondary | ICD-10-CM | POA: Diagnosis not present

## 2022-03-17 DIAGNOSIS — I4821 Permanent atrial fibrillation: Secondary | ICD-10-CM | POA: Diagnosis not present

## 2022-03-22 DIAGNOSIS — I5022 Chronic systolic (congestive) heart failure: Secondary | ICD-10-CM | POA: Diagnosis not present

## 2022-03-22 DIAGNOSIS — I4821 Permanent atrial fibrillation: Secondary | ICD-10-CM | POA: Diagnosis not present

## 2022-03-22 DIAGNOSIS — I5030 Unspecified diastolic (congestive) heart failure: Secondary | ICD-10-CM | POA: Diagnosis not present

## 2022-03-22 DIAGNOSIS — I050 Rheumatic mitral stenosis: Secondary | ICD-10-CM | POA: Diagnosis not present

## 2022-03-22 DIAGNOSIS — I110 Hypertensive heart disease with heart failure: Secondary | ICD-10-CM | POA: Diagnosis not present

## 2022-03-22 DIAGNOSIS — D6862 Lupus anticoagulant syndrome: Secondary | ICD-10-CM | POA: Diagnosis not present

## 2022-03-25 DIAGNOSIS — D6862 Lupus anticoagulant syndrome: Secondary | ICD-10-CM | POA: Diagnosis not present

## 2022-03-25 DIAGNOSIS — I5030 Unspecified diastolic (congestive) heart failure: Secondary | ICD-10-CM | POA: Diagnosis not present

## 2022-03-25 DIAGNOSIS — I110 Hypertensive heart disease with heart failure: Secondary | ICD-10-CM | POA: Diagnosis not present

## 2022-03-25 DIAGNOSIS — I050 Rheumatic mitral stenosis: Secondary | ICD-10-CM | POA: Diagnosis not present

## 2022-03-25 DIAGNOSIS — I5022 Chronic systolic (congestive) heart failure: Secondary | ICD-10-CM | POA: Diagnosis not present

## 2022-03-25 DIAGNOSIS — I4821 Permanent atrial fibrillation: Secondary | ICD-10-CM | POA: Diagnosis not present

## 2022-03-28 DIAGNOSIS — Z87440 Personal history of urinary (tract) infections: Secondary | ICD-10-CM | POA: Diagnosis not present

## 2022-03-28 DIAGNOSIS — I5030 Unspecified diastolic (congestive) heart failure: Secondary | ICD-10-CM | POA: Diagnosis not present

## 2022-03-28 DIAGNOSIS — D539 Nutritional anemia, unspecified: Secondary | ICD-10-CM | POA: Diagnosis not present

## 2022-03-28 DIAGNOSIS — F32A Depression, unspecified: Secondary | ICD-10-CM | POA: Diagnosis not present

## 2022-03-28 DIAGNOSIS — Z7901 Long term (current) use of anticoagulants: Secondary | ICD-10-CM | POA: Diagnosis not present

## 2022-03-28 DIAGNOSIS — Z6841 Body Mass Index (BMI) 40.0 and over, adult: Secondary | ICD-10-CM | POA: Diagnosis not present

## 2022-03-28 DIAGNOSIS — Z86718 Personal history of other venous thrombosis and embolism: Secondary | ICD-10-CM | POA: Diagnosis not present

## 2022-03-28 DIAGNOSIS — I110 Hypertensive heart disease with heart failure: Secondary | ICD-10-CM | POA: Diagnosis not present

## 2022-03-28 DIAGNOSIS — D6862 Lupus anticoagulant syndrome: Secondary | ICD-10-CM | POA: Diagnosis not present

## 2022-03-28 DIAGNOSIS — Z8701 Personal history of pneumonia (recurrent): Secondary | ICD-10-CM | POA: Diagnosis not present

## 2022-03-28 DIAGNOSIS — M1990 Unspecified osteoarthritis, unspecified site: Secondary | ICD-10-CM | POA: Diagnosis not present

## 2022-03-28 DIAGNOSIS — I5022 Chronic systolic (congestive) heart failure: Secondary | ICD-10-CM | POA: Diagnosis not present

## 2022-03-28 DIAGNOSIS — I050 Rheumatic mitral stenosis: Secondary | ICD-10-CM | POA: Diagnosis not present

## 2022-03-28 DIAGNOSIS — Z9181 History of falling: Secondary | ICD-10-CM | POA: Diagnosis not present

## 2022-03-28 DIAGNOSIS — I4821 Permanent atrial fibrillation: Secondary | ICD-10-CM | POA: Diagnosis not present

## 2022-03-28 DIAGNOSIS — Z7982 Long term (current) use of aspirin: Secondary | ICD-10-CM | POA: Diagnosis not present

## 2022-03-28 DIAGNOSIS — Z8673 Personal history of transient ischemic attack (TIA), and cerebral infarction without residual deficits: Secondary | ICD-10-CM | POA: Diagnosis not present

## 2022-03-28 DIAGNOSIS — Z86711 Personal history of pulmonary embolism: Secondary | ICD-10-CM | POA: Diagnosis not present

## 2022-03-29 DIAGNOSIS — I110 Hypertensive heart disease with heart failure: Secondary | ICD-10-CM | POA: Diagnosis not present

## 2022-03-29 DIAGNOSIS — I5022 Chronic systolic (congestive) heart failure: Secondary | ICD-10-CM | POA: Diagnosis not present

## 2022-03-29 DIAGNOSIS — D6862 Lupus anticoagulant syndrome: Secondary | ICD-10-CM | POA: Diagnosis not present

## 2022-03-29 DIAGNOSIS — I5030 Unspecified diastolic (congestive) heart failure: Secondary | ICD-10-CM | POA: Diagnosis not present

## 2022-03-29 DIAGNOSIS — I050 Rheumatic mitral stenosis: Secondary | ICD-10-CM | POA: Diagnosis not present

## 2022-03-29 DIAGNOSIS — I4821 Permanent atrial fibrillation: Secondary | ICD-10-CM | POA: Diagnosis not present

## 2022-03-31 DIAGNOSIS — I5022 Chronic systolic (congestive) heart failure: Secondary | ICD-10-CM | POA: Diagnosis not present

## 2022-03-31 DIAGNOSIS — I4821 Permanent atrial fibrillation: Secondary | ICD-10-CM | POA: Diagnosis not present

## 2022-03-31 DIAGNOSIS — I050 Rheumatic mitral stenosis: Secondary | ICD-10-CM | POA: Diagnosis not present

## 2022-03-31 DIAGNOSIS — I5030 Unspecified diastolic (congestive) heart failure: Secondary | ICD-10-CM | POA: Diagnosis not present

## 2022-03-31 DIAGNOSIS — I110 Hypertensive heart disease with heart failure: Secondary | ICD-10-CM | POA: Diagnosis not present

## 2022-03-31 DIAGNOSIS — D6862 Lupus anticoagulant syndrome: Secondary | ICD-10-CM | POA: Diagnosis not present

## 2022-04-03 ENCOUNTER — Other Ambulatory Visit: Payer: Self-pay | Admitting: Cardiology

## 2022-04-03 DIAGNOSIS — I4821 Permanent atrial fibrillation: Secondary | ICD-10-CM

## 2022-04-04 DIAGNOSIS — I4821 Permanent atrial fibrillation: Secondary | ICD-10-CM | POA: Diagnosis not present

## 2022-04-04 DIAGNOSIS — I5030 Unspecified diastolic (congestive) heart failure: Secondary | ICD-10-CM | POA: Diagnosis not present

## 2022-04-04 DIAGNOSIS — I5022 Chronic systolic (congestive) heart failure: Secondary | ICD-10-CM | POA: Diagnosis not present

## 2022-04-04 DIAGNOSIS — D6862 Lupus anticoagulant syndrome: Secondary | ICD-10-CM | POA: Diagnosis not present

## 2022-04-04 DIAGNOSIS — I050 Rheumatic mitral stenosis: Secondary | ICD-10-CM | POA: Diagnosis not present

## 2022-04-04 DIAGNOSIS — I110 Hypertensive heart disease with heart failure: Secondary | ICD-10-CM | POA: Diagnosis not present

## 2022-04-06 ENCOUNTER — Other Ambulatory Visit: Payer: Self-pay | Admitting: Cardiology

## 2022-04-06 DIAGNOSIS — I4821 Permanent atrial fibrillation: Secondary | ICD-10-CM

## 2022-04-07 NOTE — Telephone Encounter (Signed)
INR appt scheduled for 1/12

## 2022-04-08 ENCOUNTER — Other Ambulatory Visit: Payer: Self-pay

## 2022-04-08 ENCOUNTER — Ambulatory Visit: Payer: Medicare Other | Attending: Cardiology

## 2022-04-08 DIAGNOSIS — I743 Embolism and thrombosis of arteries of the lower extremities: Secondary | ICD-10-CM | POA: Diagnosis not present

## 2022-04-08 DIAGNOSIS — Z86711 Personal history of pulmonary embolism: Secondary | ICD-10-CM

## 2022-04-08 DIAGNOSIS — I4821 Permanent atrial fibrillation: Secondary | ICD-10-CM

## 2022-04-08 DIAGNOSIS — Z5181 Encounter for therapeutic drug level monitoring: Secondary | ICD-10-CM | POA: Diagnosis not present

## 2022-04-08 LAB — POCT INR: INR: 2 (ref 2.0–3.0)

## 2022-04-08 MED ORDER — WARFARIN SODIUM 3 MG PO TABS: ORAL_TABLET | ORAL | 2 refills | Status: DC

## 2022-04-08 NOTE — Telephone Encounter (Signed)
Prescription refill request received for warfarin [place]: 03/05/21 ([place])  Next INR check: 04/22/22 Warfarin tablet strength: '3mg'$   Appropriate dose and refill sent to requested pharmacy.

## 2022-04-08 NOTE — Patient Instructions (Signed)
Description   Take 2.5 tablets today and 2 tablets tomorrow and then continue taking Warfarin 2 tablets daily except 1.5 tablets on Tuesdays, Thursdays, and Saturdays.  Stay consistent with greens each week (2 per week).   Recheck in 2 week.  [facility] # [phone]

## 2022-04-11 DIAGNOSIS — I050 Rheumatic mitral stenosis: Secondary | ICD-10-CM | POA: Diagnosis not present

## 2022-04-11 DIAGNOSIS — I4821 Permanent atrial fibrillation: Secondary | ICD-10-CM | POA: Diagnosis not present

## 2022-04-11 DIAGNOSIS — D6862 Lupus anticoagulant syndrome: Secondary | ICD-10-CM | POA: Diagnosis not present

## 2022-04-11 DIAGNOSIS — I110 Hypertensive heart disease with heart failure: Secondary | ICD-10-CM | POA: Diagnosis not present

## 2022-04-11 DIAGNOSIS — I5022 Chronic systolic (congestive) heart failure: Secondary | ICD-10-CM | POA: Diagnosis not present

## 2022-04-11 DIAGNOSIS — I5030 Unspecified diastolic (congestive) heart failure: Secondary | ICD-10-CM | POA: Diagnosis not present

## 2022-04-18 DIAGNOSIS — I5030 Unspecified diastolic (congestive) heart failure: Secondary | ICD-10-CM | POA: Diagnosis not present

## 2022-04-18 DIAGNOSIS — I5022 Chronic systolic (congestive) heart failure: Secondary | ICD-10-CM | POA: Diagnosis not present

## 2022-04-18 DIAGNOSIS — D6862 Lupus anticoagulant syndrome: Secondary | ICD-10-CM | POA: Diagnosis not present

## 2022-04-18 DIAGNOSIS — I4821 Permanent atrial fibrillation: Secondary | ICD-10-CM | POA: Diagnosis not present

## 2022-04-18 DIAGNOSIS — I110 Hypertensive heart disease with heart failure: Secondary | ICD-10-CM | POA: Diagnosis not present

## 2022-04-18 DIAGNOSIS — I050 Rheumatic mitral stenosis: Secondary | ICD-10-CM | POA: Diagnosis not present

## 2022-04-22 ENCOUNTER — Ambulatory Visit: Payer: Medicare Other

## 2022-04-22 DIAGNOSIS — I5030 Unspecified diastolic (congestive) heart failure: Secondary | ICD-10-CM | POA: Diagnosis not present

## 2022-04-22 DIAGNOSIS — I050 Rheumatic mitral stenosis: Secondary | ICD-10-CM | POA: Diagnosis not present

## 2022-04-22 DIAGNOSIS — I5022 Chronic systolic (congestive) heart failure: Secondary | ICD-10-CM | POA: Diagnosis not present

## 2022-04-22 DIAGNOSIS — I110 Hypertensive heart disease with heart failure: Secondary | ICD-10-CM | POA: Diagnosis not present

## 2022-04-22 DIAGNOSIS — I4821 Permanent atrial fibrillation: Secondary | ICD-10-CM | POA: Diagnosis not present

## 2022-04-22 DIAGNOSIS — D6862 Lupus anticoagulant syndrome: Secondary | ICD-10-CM | POA: Diagnosis not present

## 2022-04-25 DIAGNOSIS — I5022 Chronic systolic (congestive) heart failure: Secondary | ICD-10-CM | POA: Diagnosis not present

## 2022-04-25 DIAGNOSIS — I110 Hypertensive heart disease with heart failure: Secondary | ICD-10-CM | POA: Diagnosis not present

## 2022-04-25 DIAGNOSIS — I050 Rheumatic mitral stenosis: Secondary | ICD-10-CM | POA: Diagnosis not present

## 2022-04-25 DIAGNOSIS — I5030 Unspecified diastolic (congestive) heart failure: Secondary | ICD-10-CM | POA: Diagnosis not present

## 2022-04-25 DIAGNOSIS — I4821 Permanent atrial fibrillation: Secondary | ICD-10-CM | POA: Diagnosis not present

## 2022-04-25 DIAGNOSIS — D6862 Lupus anticoagulant syndrome: Secondary | ICD-10-CM | POA: Diagnosis not present

## 2022-04-26 ENCOUNTER — Ambulatory Visit: Payer: Medicare Other | Attending: Cardiology

## 2022-04-26 DIAGNOSIS — Z86711 Personal history of pulmonary embolism: Secondary | ICD-10-CM | POA: Diagnosis not present

## 2022-04-26 DIAGNOSIS — I4821 Permanent atrial fibrillation: Secondary | ICD-10-CM

## 2022-04-26 DIAGNOSIS — Z5181 Encounter for therapeutic drug level monitoring: Secondary | ICD-10-CM | POA: Diagnosis not present

## 2022-04-26 DIAGNOSIS — I743 Embolism and thrombosis of arteries of the lower extremities: Secondary | ICD-10-CM

## 2022-04-26 LAB — POCT INR: INR: 4.5 — AB (ref 2.0–3.0)

## 2022-04-26 NOTE — Patient Instructions (Signed)
HOLD TODAY ONLY and then continue taking Warfarin 2 tablets daily except 1.5 tablets on Tuesdays, Thursdays, and Saturdays.  Stay consistent with greens each week (2 per week).   Recheck in 2 week.  Cou[facility]in Clinic # [phone]

## 2022-05-10 ENCOUNTER — Ambulatory Visit: Payer: Medicare Other | Attending: Cardiology | Admitting: *Deleted

## 2022-05-10 DIAGNOSIS — Z86711 Personal history of pulmonary embolism: Secondary | ICD-10-CM | POA: Insufficient documentation

## 2022-05-10 DIAGNOSIS — I4821 Permanent atrial fibrillation: Secondary | ICD-10-CM

## 2022-05-10 DIAGNOSIS — I743 Embolism and thrombosis of arteries of the lower extremities: Secondary | ICD-10-CM | POA: Insufficient documentation

## 2022-05-10 DIAGNOSIS — Z5181 Encounter for therapeutic drug level monitoring: Secondary | ICD-10-CM | POA: Insufficient documentation

## 2022-05-10 LAB — POCT INR: INR: 4.9 — AB (ref 2.0–3.0)

## 2022-05-10 NOTE — Patient Instructions (Signed)
Description   HOLD TODAY AND TOMORROW'S DOSE OF WARFARIN THEN START TAKING  Warfarin 1.5 tablets daily except 2 tablets on Mondays and Fridays. Stay consistent with greens each week (2 per week). Recheck in 2 weeks.  Coumadin Clinic # [id number]

## 2022-05-24 ENCOUNTER — Ambulatory Visit: Payer: Medicare Other

## 2022-05-26 ENCOUNTER — Ambulatory Visit: Payer: Medicare Other

## 2022-05-30 ENCOUNTER — Emergency Department (HOSPITAL_COMMUNITY): Payer: PRIVATE HEALTH INSURANCE

## 2022-05-30 ENCOUNTER — Encounter (HOSPITAL_COMMUNITY): Payer: Self-pay | Admitting: Internal Medicine

## 2022-05-30 ENCOUNTER — Inpatient Hospital Stay (HOSPITAL_COMMUNITY)
Admission: EM | Admit: 2022-05-30 | Discharge: 2022-06-03 | DRG: 964 | Disposition: A | Discharge status: Skilled Nursing Facility | Payer: PRIVATE HEALTH INSURANCE | Attending: Internal Medicine | Admitting: Internal Medicine

## 2022-05-30 DIAGNOSIS — Z7982 Long term (current) use of aspirin: Secondary | ICD-10-CM | POA: Diagnosis not present

## 2022-05-30 DIAGNOSIS — S7002XA Contusion of left hip, initial encounter: Secondary | ICD-10-CM | POA: Diagnosis present

## 2022-05-30 DIAGNOSIS — I428 Other cardiomyopathies: Secondary | ICD-10-CM | POA: Diagnosis not present

## 2022-05-30 DIAGNOSIS — T148XXA Other injury of unspecified body region, initial encounter: Secondary | ICD-10-CM | POA: Diagnosis not present

## 2022-05-30 DIAGNOSIS — S2241XA Multiple fractures of ribs, right side, initial encounter for closed fracture: Secondary | ICD-10-CM

## 2022-05-30 DIAGNOSIS — Z95828 Presence of other vascular implants and grafts: Secondary | ICD-10-CM

## 2022-05-30 DIAGNOSIS — Z86718 Personal history of other venous thrombosis and embolism: Secondary | ICD-10-CM | POA: Diagnosis not present

## 2022-05-30 DIAGNOSIS — S301XXD Contusion of abdominal wall, subsequent encounter: Secondary | ICD-10-CM | POA: Diagnosis not present

## 2022-05-30 DIAGNOSIS — Z8249 Family history of ischemic heart disease and other diseases of the circulatory system: Secondary | ICD-10-CM | POA: Diagnosis not present

## 2022-05-30 DIAGNOSIS — S301XXA Contusion of abdominal wall, initial encounter: Secondary | ICD-10-CM

## 2022-05-30 DIAGNOSIS — S7012XA Contusion of left thigh, initial encounter: Secondary | ICD-10-CM | POA: Diagnosis not present

## 2022-05-30 DIAGNOSIS — Z79899 Other long term (current) drug therapy: Secondary | ICD-10-CM | POA: Diagnosis not present

## 2022-05-30 DIAGNOSIS — I110 Hypertensive heart disease with heart failure: Secondary | ICD-10-CM | POA: Diagnosis present

## 2022-05-30 DIAGNOSIS — Z833 Family history of diabetes mellitus: Secondary | ICD-10-CM

## 2022-05-30 DIAGNOSIS — F32A Depression, unspecified: Secondary | ICD-10-CM | POA: Diagnosis present

## 2022-05-30 DIAGNOSIS — K683 Retroperitoneal hematoma: Secondary | ICD-10-CM | POA: Diagnosis not present

## 2022-05-30 DIAGNOSIS — Z888 Allergy status to other drugs, medicaments and biological substances status: Secondary | ICD-10-CM | POA: Diagnosis not present

## 2022-05-30 DIAGNOSIS — I5022 Chronic systolic (congestive) heart failure: Secondary | ICD-10-CM | POA: Diagnosis not present

## 2022-05-30 DIAGNOSIS — S3210XA Unspecified fracture of sacrum, initial encounter for closed fracture: Secondary | ICD-10-CM | POA: Diagnosis not present

## 2022-05-30 DIAGNOSIS — S32119A Unspecified Zone I fracture of sacrum, initial encounter for closed fracture: Secondary | ICD-10-CM | POA: Diagnosis present

## 2022-05-30 DIAGNOSIS — I4821 Permanent atrial fibrillation: Secondary | ICD-10-CM | POA: Diagnosis not present

## 2022-05-30 DIAGNOSIS — Y92410 Unspecified street and highway as the place of occurrence of the external cause: Secondary | ICD-10-CM

## 2022-05-30 DIAGNOSIS — S36892A Contusion of other intra-abdominal organs, initial encounter: Secondary | ICD-10-CM | POA: Diagnosis not present

## 2022-05-30 DIAGNOSIS — M6281 Muscle weakness (generalized): Secondary | ICD-10-CM | POA: Diagnosis not present

## 2022-05-30 DIAGNOSIS — Z6841 Body Mass Index (BMI) 40.0 and over, adult: Secondary | ICD-10-CM

## 2022-05-30 DIAGNOSIS — Z7401 Bed confinement status: Secondary | ICD-10-CM | POA: Diagnosis not present

## 2022-05-30 DIAGNOSIS — R791 Abnormal coagulation profile: Secondary | ICD-10-CM | POA: Diagnosis not present

## 2022-05-30 DIAGNOSIS — D509 Iron deficiency anemia, unspecified: Secondary | ICD-10-CM | POA: Diagnosis not present

## 2022-05-30 DIAGNOSIS — D62 Acute posthemorrhagic anemia: Secondary | ICD-10-CM | POA: Diagnosis not present

## 2022-05-30 DIAGNOSIS — Z8673 Personal history of transient ischemic attack (TIA), and cerebral infarction without residual deficits: Secondary | ICD-10-CM | POA: Diagnosis not present

## 2022-05-30 DIAGNOSIS — S0990XA Unspecified injury of head, initial encounter: Secondary | ICD-10-CM | POA: Diagnosis not present

## 2022-05-30 DIAGNOSIS — M79652 Pain in left thigh: Secondary | ICD-10-CM | POA: Diagnosis not present

## 2022-05-30 DIAGNOSIS — F339 Major depressive disorder, recurrent, unspecified: Secondary | ICD-10-CM | POA: Diagnosis not present

## 2022-05-30 DIAGNOSIS — M79606 Pain in leg, unspecified: Secondary | ICD-10-CM | POA: Diagnosis not present

## 2022-05-30 DIAGNOSIS — R102 Pelvic and perineal pain: Secondary | ICD-10-CM | POA: Diagnosis not present

## 2022-05-30 DIAGNOSIS — S3210XD Unspecified fracture of sacrum, subsequent encounter for fracture with routine healing: Secondary | ICD-10-CM | POA: Diagnosis not present

## 2022-05-30 DIAGNOSIS — E66813 Obesity, class 3: Secondary | ICD-10-CM

## 2022-05-30 DIAGNOSIS — Z743 Need for continuous supervision: Secondary | ICD-10-CM | POA: Diagnosis not present

## 2022-05-30 DIAGNOSIS — M159 Polyosteoarthritis, unspecified: Secondary | ICD-10-CM | POA: Diagnosis not present

## 2022-05-30 DIAGNOSIS — I5042 Chronic combined systolic (congestive) and diastolic (congestive) heart failure: Secondary | ICD-10-CM | POA: Diagnosis present

## 2022-05-30 DIAGNOSIS — I10 Essential (primary) hypertension: Secondary | ICD-10-CM | POA: Diagnosis present

## 2022-05-30 DIAGNOSIS — M6289 Other specified disorders of muscle: Secondary | ICD-10-CM | POA: Diagnosis not present

## 2022-05-30 DIAGNOSIS — E8809 Other disorders of plasma-protein metabolism, not elsewhere classified: Secondary | ICD-10-CM | POA: Diagnosis not present

## 2022-05-30 DIAGNOSIS — Z86711 Personal history of pulmonary embolism: Secondary | ICD-10-CM | POA: Diagnosis not present

## 2022-05-30 DIAGNOSIS — I6782 Cerebral ischemia: Secondary | ICD-10-CM | POA: Diagnosis not present

## 2022-05-30 DIAGNOSIS — Z862 Personal history of diseases of the blood and blood-forming organs and certain disorders involving the immune mechanism: Secondary | ICD-10-CM | POA: Diagnosis not present

## 2022-05-30 DIAGNOSIS — D6862 Lupus anticoagulant syndrome: Secondary | ICD-10-CM | POA: Diagnosis not present

## 2022-05-30 DIAGNOSIS — S2241XD Multiple fractures of ribs, right side, subsequent encounter for fracture with routine healing: Secondary | ICD-10-CM | POA: Diagnosis not present

## 2022-05-30 DIAGNOSIS — Z7901 Long term (current) use of anticoagulants: Secondary | ICD-10-CM

## 2022-05-30 DIAGNOSIS — K8020 Calculus of gallbladder without cholecystitis without obstruction: Secondary | ICD-10-CM | POA: Diagnosis not present

## 2022-05-30 DIAGNOSIS — I342 Nonrheumatic mitral (valve) stenosis: Secondary | ICD-10-CM | POA: Diagnosis not present

## 2022-05-30 DIAGNOSIS — E559 Vitamin D deficiency, unspecified: Secondary | ICD-10-CM | POA: Diagnosis not present

## 2022-05-30 DIAGNOSIS — I482 Chronic atrial fibrillation, unspecified: Secondary | ICD-10-CM | POA: Diagnosis not present

## 2022-05-30 DIAGNOSIS — R531 Weakness: Secondary | ICD-10-CM | POA: Diagnosis not present

## 2022-05-30 DIAGNOSIS — R2689 Other abnormalities of gait and mobility: Secondary | ICD-10-CM | POA: Diagnosis not present

## 2022-05-30 DIAGNOSIS — S32129A Unspecified Zone II fracture of sacrum, initial encounter for closed fracture: Secondary | ICD-10-CM | POA: Diagnosis not present

## 2022-05-30 DIAGNOSIS — K5730 Diverticulosis of large intestine without perforation or abscess without bleeding: Secondary | ICD-10-CM | POA: Diagnosis not present

## 2022-05-30 HISTORY — DX: Benign neoplasm of connective and other soft tissue of left lower limb, including hip: D21.22

## 2022-05-30 LAB — CBC
HCT: 30.3 % — ABNORMAL LOW (ref 36.0–46.0)
Hemoglobin: 10 g/dL — ABNORMAL LOW (ref 12.0–15.0)
MCH: 33.4 pg (ref 26.0–34.0)
MCHC: 33 g/dL (ref 30.0–36.0)
MCV: 101.3 fL — ABNORMAL HIGH (ref 80.0–100.0)
Platelets: 192 10*3/uL (ref 150–400)
RBC: 2.99 MIL/uL — ABNORMAL LOW (ref 3.87–5.11)
RDW: 12.9 % (ref 11.5–15.5)
WBC: 12.7 10*3/uL — ABNORMAL HIGH (ref 4.0–10.5)
nRBC: 0.2 % (ref 0.0–0.2)

## 2022-05-30 LAB — COMPREHENSIVE METABOLIC PANEL
ALT: 18 U/L (ref 0–44)
AST: 45 U/L — ABNORMAL HIGH (ref 15–41)
Albumin: 3.5 g/dL (ref 3.5–5.0)
Alkaline Phosphatase: 77 U/L (ref 38–126)
Anion gap: 12 (ref 5–15)
BUN: 18 mg/dL (ref 8–23)
CO2: 26 mmol/L (ref 22–32)
Calcium: 8.9 mg/dL (ref 8.9–10.3)
Chloride: 102 mmol/L (ref 98–111)
Creatinine, Ser: 1.04 mg/dL — ABNORMAL HIGH (ref 0.44–1.00)
GFR, Estimated: 56 mL/min — ABNORMAL LOW (ref >60)
Glucose, Bld: 172 mg/dL — ABNORMAL HIGH (ref 70–99)
Potassium: 3.9 mmol/L (ref 3.5–5.1)
Sodium: 140 mmol/L (ref 135–145)
Total Bilirubin: 2.2 mg/dL — ABNORMAL HIGH (ref 0.3–1.2)
Total Protein: 7.4 g/dL (ref 6.5–8.1)

## 2022-05-30 LAB — PROTIME-INR
INR: 5.7 (ref 0.8–1.2)
Prothrombin Time: 50.7 seconds — ABNORMAL HIGH (ref 11.4–15.2)

## 2022-05-30 LAB — TYPE AND SCREEN
ABO/RH(D): O POS
Antibody Screen: NEGATIVE

## 2022-05-30 MED ORDER — HYDROCODONE-ACETAMINOPHEN 5-325 MG PO TABS: 1.0000 | ORAL_TABLET | ORAL | Status: DC | PRN

## 2022-05-30 MED ORDER — WARFARIN - PHARMACIST DOSING INPATIENT: Freq: Every day | Status: DC

## 2022-05-30 MED ORDER — ONDANSETRON HCL 4 MG/2ML IJ SOLN
4.0000 mg | Freq: Once | INTRAMUSCULAR | Status: AC
  Administered 2022-05-30: 4 mg via INTRAVENOUS
  Filled 2022-05-30: qty 2

## 2022-05-30 MED ORDER — MORPHINE SULFATE (PF) 4 MG/ML IV SOLN
4.0000 mg | Freq: Once | INTRAVENOUS | Status: AC
  Administered 2022-05-30: 4 mg via INTRAVENOUS
  Filled 2022-05-30: qty 1

## 2022-05-30 MED ORDER — MORPHINE SULFATE (PF) 4 MG/ML IV SOLN: 4.0000 mg | INTRAVENOUS | Status: DC | PRN

## 2022-05-30 MED ORDER — IOHEXOL 350 MG/ML SOLN
100.0000 mL | Freq: Once | INTRAVENOUS | Status: AC | PRN
  Administered 2022-05-30: 100 mL via INTRAVENOUS
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
## 2022-05-30 NOTE — Assessment & Plan Note (Signed)
Stable. On lopresor.

## 2022-05-30 NOTE — ED Notes (Signed)
[name]- Daughter- [phone]
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
## 2022-05-30 NOTE — ED Notes (Signed)
Bruising noted to pt left upper leg.

## 2022-05-30 NOTE — Assessment & Plan Note (Signed)
Monitor HgB q6h. Trauma surgeon Dr. Redmond Pulling consulted by EDP.

## 2022-05-30 NOTE — Assessment & Plan Note (Signed)
Stable. 
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
## 2022-05-30 NOTE — Assessment & Plan Note (Signed)
On coumadin for hx of afib and hx of DVT/PE/lupus anticoagulant

## 2022-05-30 NOTE — Assessment & Plan Note (Signed)
Hold losartan and lasix for now given blood loss. Continue with lopressor for afib rate control.

## 2022-05-30 NOTE — Assessment & Plan Note (Addendum)
Pharmacy to monitor. Will need to hold coumadin for now. Given hx of PE/DVT, will hold off on reversal unless pt's HgB starts to drop rapidly

## 2022-05-30 NOTE — Assessment & Plan Note (Signed)
BMI 41.6

## 2022-05-30 NOTE — Assessment & Plan Note (Addendum)
Observation med/tele bed. Monitor HgB q6h. Trauma surgeon Dr. Redmond Pulling consulted by EDP. Norco 5/325 mg prn for moderate pain. Morphine 4 mg IV prn for severe pain

## 2022-05-30 NOTE — Assessment & Plan Note (Addendum)
Monitor HgB q6h. Trauma surgeon Dr. Redmond Pulling consulted by EDP.

## 2022-05-30 NOTE — ED Notes (Signed)
Patient had BM once roomed. Patient rolled with another to assist this tech with peri care and given clean brief. Patients belongings placed into belongings bag. Provided pt with gown and updated vitals. RN is at bedside at this time.
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
## 2022-05-30 NOTE — ED Provider Notes (Signed)
Received patient in turnover from Dr. Jeanell Sparrow.  Please see their note for further details of Hx, PE.  Briefly patient is a 77 y.o. 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  Supratherapeutic INR.  Hematoma to the abdomen.  Hematoma to the thigh.  Patient pending CT imaging.  CT as result with 3 rib fractures on the right and a small collection of fluid at the right lung base.  She has a questionable acute versus chronic sacral ala fracture on the left.  She has a likely intramuscular hematoma to the left iliac [place] area with some retroperitoneal stranding.   She denies any chest pain or difficulty breathing or fevers to me.  Will discuss with trauma.  Trauma Dr. Redmond Pulling, recommends discussion with ortho.   I discussed with Dr. Stann Mainland, orthopedics based on the location of the hematoma but no likely surgical intervention.  Recommended medical evaluation and thoughts and observation of her INR.  Will discuss with medicine.  CRITICAL CARE Performed by: Cecilio Asper   Total critical care time: 35 minutes  Critical care time was exclusive of separately billable procedures and treating other patients.  Critical care was necessary to treat or prevent imminent or life-threatening deterioration.  Critical care was time spent personally by me on the following activities: development of treatment plan with patient and/or surrogate as well as nursing, discussions with consultants, evaluation of patient's response to treatment, examination of patient, obtaining history from patient or surrogate, ordering and performing treatments and interventions, ordering and review of laboratory studies, ordering and review of radiographic studies, pulse oximetry and re-evaluation of patient's condition.    Deno Etienne, DO 05/30/22 2106

## 2022-05-30 NOTE — Assessment & Plan Note (Signed)
On coumadin 
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
## 2022-05-31 ENCOUNTER — Other Ambulatory Visit: Payer: Self-pay

## 2022-05-31 DIAGNOSIS — Z79899 Other long term (current) drug therapy: Secondary | ICD-10-CM | POA: Diagnosis not present

## 2022-05-31 DIAGNOSIS — I110 Hypertensive heart disease with heart failure: Secondary | ICD-10-CM | POA: Diagnosis present

## 2022-05-31 DIAGNOSIS — Z86711 Personal history of pulmonary embolism: Secondary | ICD-10-CM | POA: Diagnosis not present

## 2022-05-31 DIAGNOSIS — I342 Nonrheumatic mitral (valve) stenosis: Secondary | ICD-10-CM | POA: Diagnosis not present

## 2022-05-31 DIAGNOSIS — Z95828 Presence of other vascular implants and grafts: Secondary | ICD-10-CM | POA: Diagnosis not present

## 2022-05-31 DIAGNOSIS — Z888 Allergy status to other drugs, medicaments and biological substances status: Secondary | ICD-10-CM | POA: Diagnosis not present

## 2022-05-31 DIAGNOSIS — D509 Iron deficiency anemia, unspecified: Secondary | ICD-10-CM | POA: Diagnosis not present

## 2022-05-31 DIAGNOSIS — Z6841 Body Mass Index (BMI) 40.0 and over, adult: Secondary | ICD-10-CM | POA: Diagnosis not present

## 2022-05-31 DIAGNOSIS — S301XXD Contusion of abdominal wall, subsequent encounter: Secondary | ICD-10-CM | POA: Diagnosis not present

## 2022-05-31 DIAGNOSIS — Z86718 Personal history of other venous thrombosis and embolism: Secondary | ICD-10-CM | POA: Diagnosis not present

## 2022-05-31 DIAGNOSIS — D6862 Lupus anticoagulant syndrome: Secondary | ICD-10-CM | POA: Diagnosis not present

## 2022-05-31 DIAGNOSIS — I5042 Chronic combined systolic (congestive) and diastolic (congestive) heart failure: Secondary | ICD-10-CM | POA: Diagnosis present

## 2022-05-31 DIAGNOSIS — S2241XD Multiple fractures of ribs, right side, subsequent encounter for fracture with routine healing: Secondary | ICD-10-CM | POA: Diagnosis not present

## 2022-05-31 DIAGNOSIS — D62 Acute posthemorrhagic anemia: Secondary | ICD-10-CM | POA: Diagnosis not present

## 2022-05-31 DIAGNOSIS — M6281 Muscle weakness (generalized): Secondary | ICD-10-CM | POA: Diagnosis not present

## 2022-05-31 DIAGNOSIS — K683 Retroperitoneal hematoma: Secondary | ICD-10-CM | POA: Diagnosis not present

## 2022-05-31 DIAGNOSIS — S32129A Unspecified Zone II fracture of sacrum, initial encounter for closed fracture: Secondary | ICD-10-CM | POA: Diagnosis not present

## 2022-05-31 DIAGNOSIS — S7012XA Contusion of left thigh, initial encounter: Secondary | ICD-10-CM | POA: Diagnosis not present

## 2022-05-31 DIAGNOSIS — I428 Other cardiomyopathies: Secondary | ICD-10-CM | POA: Diagnosis present

## 2022-05-31 DIAGNOSIS — Z833 Family history of diabetes mellitus: Secondary | ICD-10-CM | POA: Diagnosis not present

## 2022-05-31 DIAGNOSIS — I4821 Permanent atrial fibrillation: Secondary | ICD-10-CM | POA: Diagnosis present

## 2022-05-31 DIAGNOSIS — F32A Depression, unspecified: Secondary | ICD-10-CM | POA: Diagnosis present

## 2022-05-31 DIAGNOSIS — I10 Essential (primary) hypertension: Secondary | ICD-10-CM | POA: Diagnosis not present

## 2022-05-31 DIAGNOSIS — Z8673 Personal history of transient ischemic attack (TIA), and cerebral infarction without residual deficits: Secondary | ICD-10-CM | POA: Diagnosis not present

## 2022-05-31 DIAGNOSIS — Z8249 Family history of ischemic heart disease and other diseases of the circulatory system: Secondary | ICD-10-CM | POA: Diagnosis not present

## 2022-05-31 DIAGNOSIS — Z743 Need for continuous supervision: Secondary | ICD-10-CM | POA: Diagnosis not present

## 2022-05-31 DIAGNOSIS — R531 Weakness: Secondary | ICD-10-CM | POA: Diagnosis not present

## 2022-05-31 DIAGNOSIS — S36892A Contusion of other intra-abdominal organs, initial encounter: Secondary | ICD-10-CM

## 2022-05-31 DIAGNOSIS — E559 Vitamin D deficiency, unspecified: Secondary | ICD-10-CM | POA: Diagnosis not present

## 2022-05-31 DIAGNOSIS — Z7401 Bed confinement status: Secondary | ICD-10-CM | POA: Diagnosis not present

## 2022-05-31 DIAGNOSIS — I6782 Cerebral ischemia: Secondary | ICD-10-CM | POA: Diagnosis not present

## 2022-05-31 DIAGNOSIS — M6289 Other specified disorders of muscle: Secondary | ICD-10-CM | POA: Diagnosis not present

## 2022-05-31 DIAGNOSIS — S2241XA Multiple fractures of ribs, right side, initial encounter for closed fracture: Secondary | ICD-10-CM | POA: Diagnosis present

## 2022-05-31 DIAGNOSIS — R2689 Other abnormalities of gait and mobility: Secondary | ICD-10-CM | POA: Diagnosis not present

## 2022-05-31 DIAGNOSIS — S32119A Unspecified Zone I fracture of sacrum, initial encounter for closed fracture: Secondary | ICD-10-CM | POA: Diagnosis present

## 2022-05-31 DIAGNOSIS — F339 Major depressive disorder, recurrent, unspecified: Secondary | ICD-10-CM | POA: Diagnosis not present

## 2022-05-31 DIAGNOSIS — I5022 Chronic systolic (congestive) heart failure: Secondary | ICD-10-CM | POA: Diagnosis not present

## 2022-05-31 DIAGNOSIS — M159 Polyosteoarthritis, unspecified: Secondary | ICD-10-CM | POA: Diagnosis not present

## 2022-05-31 DIAGNOSIS — E8809 Other disorders of plasma-protein metabolism, not elsewhere classified: Secondary | ICD-10-CM | POA: Diagnosis not present

## 2022-05-31 DIAGNOSIS — S7002XA Contusion of left hip, initial encounter: Secondary | ICD-10-CM | POA: Diagnosis present

## 2022-05-31 DIAGNOSIS — Y92410 Unspecified street and highway as the place of occurrence of the external cause: Secondary | ICD-10-CM | POA: Diagnosis not present

## 2022-05-31 DIAGNOSIS — S3210XD Unspecified fracture of sacrum, subsequent encounter for fracture with routine healing: Secondary | ICD-10-CM | POA: Diagnosis not present

## 2022-05-31 DIAGNOSIS — Z7982 Long term (current) use of aspirin: Secondary | ICD-10-CM | POA: Diagnosis not present

## 2022-05-31 DIAGNOSIS — Z7901 Long term (current) use of anticoagulants: Secondary | ICD-10-CM | POA: Diagnosis not present

## 2022-05-31 DIAGNOSIS — I482 Chronic atrial fibrillation, unspecified: Secondary | ICD-10-CM | POA: Diagnosis not present

## 2022-05-31 LAB — HEMOGLOBIN AND HEMATOCRIT, BLOOD
HCT: 24.6 % — ABNORMAL LOW (ref 36.0–46.0)
HCT: 26.2 % — ABNORMAL LOW (ref 36.0–46.0)
HCT: 24.3 % — ABNORMAL LOW (ref 36.0–46.0)
Hemoglobin: 8.3 g/dL — ABNORMAL LOW (ref 12.0–15.0)
Hemoglobin: 8.5 g/dL — ABNORMAL LOW (ref 12.0–15.0)
Hemoglobin: 8.1 g/dL — ABNORMAL LOW (ref 12.0–15.0)

## 2022-05-31 LAB — COMPREHENSIVE METABOLIC PANEL
ALT: 16 U/L (ref 0–44)
AST: 37 U/L (ref 15–41)
Albumin: 2.8 g/dL — ABNORMAL LOW (ref 3.5–5.0)
Alkaline Phosphatase: 65 U/L (ref 38–126)
Anion gap: 6 (ref 5–15)
BUN: 14 mg/dL (ref 8–23)
CO2: 27 mmol/L (ref 22–32)
Calcium: 8.4 mg/dL — ABNORMAL LOW (ref 8.9–10.3)
Chloride: 103 mmol/L (ref 98–111)
Creatinine, Ser: 0.89 mg/dL (ref 0.44–1.00)
GFR, Estimated: >60 mL/min (ref >60)
Glucose, Bld: 133 mg/dL — ABNORMAL HIGH (ref 70–99)
Potassium: 3.7 mmol/L (ref 3.5–5.1)
Sodium: 136 mmol/L (ref 135–145)
Total Bilirubin: 1.9 mg/dL — ABNORMAL HIGH (ref 0.3–1.2)
Total Protein: 6.2 g/dL — ABNORMAL LOW (ref 6.5–8.1)

## 2022-05-31 LAB — MAGNESIUM: Magnesium: 1.9 mg/dL (ref 1.7–2.4)

## 2022-05-31 LAB — VITAMIN D 25 HYDROXY (VIT D DEFICIENCY, FRACTURES): Vit D, 25-Hydroxy: 48.73 ng/mL (ref 30–100)

## 2022-05-31 LAB — PROTIME-INR
INR: 6.1 (ref 0.8–1.2)
Prothrombin Time: 53.7 seconds — ABNORMAL HIGH (ref 11.4–15.2)

## 2022-05-31 MED ORDER — METOPROLOL TARTRATE 25 MG PO TABS
25.0000 mg | ORAL_TABLET | Freq: Two times a day (BID) | ORAL | Status: DC
  Administered 2022-05-31 – 2022-06-03 (×7): 25 mg via ORAL
  Filled 2022-05-31 (×8): qty 1

## 2022-05-31 MED ORDER — FUROSEMIDE 40 MG PO TABS
40.0000 mg | ORAL_TABLET | Freq: Every day | ORAL | Status: DC
  Administered 2022-05-31: 40 mg via ORAL
  Filled 2022-05-31: qty 1

## 2022-05-31 MED ORDER — ONDANSETRON HCL 4 MG/2ML IJ SOLN: 4.0000 mg | Freq: Four times a day (QID) | INTRAMUSCULAR | Status: DC | PRN

## 2022-05-31 MED ORDER — OXYCODONE HCL 5 MG PO TABS: 5.0000 mg | ORAL_TABLET | Freq: Four times a day (QID) | ORAL | Status: DC | PRN

## 2022-05-31 MED ORDER — FLUOXETINE HCL 20 MG PO CAPS
20.0000 mg | ORAL_CAPSULE | Freq: Every day | ORAL | Status: DC
  Administered 2022-05-31 – 2022-06-03 (×4): 20 mg via ORAL
  Filled 2022-05-31 (×4): qty 1

## 2022-05-31 MED ORDER — ONDANSETRON HCL 4 MG PO TABS: 4.0000 mg | ORAL_TABLET | Freq: Four times a day (QID) | ORAL | Status: DC | PRN

## 2022-05-31 MED ORDER — ASPIRIN 81 MG PO TBEC: 81.0000 mg | DELAYED_RELEASE_TABLET | Freq: Every day | ORAL | Status: DC

## 2022-05-31 MED ORDER — POTASSIUM CHLORIDE CRYS ER 10 MEQ PO TBCR
10.0000 meq | EXTENDED_RELEASE_TABLET | Freq: Every day | ORAL | Status: DC
  Administered 2022-05-31 – 2022-06-03 (×4): 10 meq via ORAL
  Filled 2022-05-31 (×7): qty 1

## 2022-05-31 MED ORDER — LIDOCAINE 5 % EX PTCH
1.0000 | MEDICATED_PATCH | CUTANEOUS | Status: DC
  Administered 2022-05-31 – 2022-06-03 (×4): 1 via TRANSDERMAL
  Filled 2022-05-31 (×4): qty 1

## 2022-05-31 MED ORDER — ACETAMINOPHEN 650 MG RE SUPP: 650.0000 mg | Freq: Four times a day (QID) | RECTAL | Status: DC | PRN

## 2022-05-31 MED ORDER — ACETAMINOPHEN 325 MG PO TABS: 650.0000 mg | ORAL_TABLET | Freq: Four times a day (QID) | ORAL | Status: DC | PRN

## 2022-05-31 MED ORDER — MORPHINE SULFATE (PF) 2 MG/ML IV SOLN: 1.0000 mg | INTRAVENOUS | Status: DC | PRN

## 2022-05-31 MED ORDER — HYDRALAZINE HCL 20 MG/ML IJ SOLN: 10.0000 mg | Freq: Four times a day (QID) | INTRAMUSCULAR | Status: DC | PRN

## 2022-05-31 MED ORDER — ACETAMINOPHEN 500 MG PO TABS
1000.0000 mg | ORAL_TABLET | Freq: Three times a day (TID) | ORAL | Status: DC
  Administered 2022-05-31 – 2022-06-03 (×10): 1000 mg via ORAL
  Filled 2022-05-31 (×11): qty 2
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
## 2022-05-31 NOTE — Progress Notes (Signed)
Date and time results received: 05/31/22 0507 (use smartphrase ".now" to insert current time)  Test: INR Critical Value: 6.1   Name of Provider Notified: Kathryne Eriksson NP  Orders Received? Or Actions Taken?: no new orders

## 2022-05-31 NOTE — TOC Initial Note (Addendum)
Transition of Care [facility]) - Initial/Assessment Note    Patient Details  Name: [name] MRN: [id number] Date of Birth: [birth year]  Transition of Care [facility]) CM/SW Contact:    Joanne Chars, LCSW Phone Number: 05/31/2022, 2:39 PM  Clinical Narrative:     CSW met with pt regarding DC recommendation for SNF.  Pt agreeable to this.  Discussed that pt is medicare and is under observation, need to confirm if pt is eligible for waiver.  Pt requesting [facility] if possible.  Pt lives alone, no current services, just completed [place] recently.  Permission given to speak with daughter Colletta Maryland, sister Langley Gauss.  Pt isvaccinated for covid and boosted.  CSW confirmed with [facility] email that pt is eligible for SNF waiver.  Referral sent out to SNFs that are eligible for waiver, reached out to [facility] to review.       1510: [facility] cannot offer bed due to [facility]          Expected Discharge Plan: Skilled Nursing Facility Barriers to Discharge: SNF Pending bed offer   Patient Goals and CMS Choice Patient states their goals for this hospitalization and ongoing recovery are:: back to normal activities   Choice offered to / list presented to : Patient      Expected Discharge Plan and Services In-house Referral: Clinical Social Work Discharge Planning Services: CM Consult Post Acute Care Choice: [place] Living arrangements for the past 2 months: [place]                                      Prior Living Arrangements/Services Living arrangements for the past 2 months: Single Family Home Lives with:: Self Patient language and need for interpreter reviewed:: Yes Do you feel safe going back to the place where you live?: Yes      Need for Family Participation in Patient Care: No (Comment) Care giver support system in place?: Yes (comment)   Criminal Activity/Legal Involvement Pertinent to Current Situation/Hospitalization: No - Comment as  needed  Activities of Daily Living Home Assistive Devices/Equipment: Cane (specify quad or straight), Walker (specify type) ADL Screening (condition at time of admission) Patient's cognitive ability adequate to safely complete daily activities?: Yes Is the patient deaf or have difficulty hearing?: No Does the patient have difficulty seeing, even when wearing glasses/contacts?: No Does the patient have difficulty concentrating, remembering, or making decisions?: No Patient able to express need for assistance with ADLs?: Yes Does the patient have difficulty dressing or bathing?: No Independently performs ADLs?: Yes (appropriate for developmental age) Does the patient have difficulty walking or climbing stairs?: No Weakness of Legs: Left Weakness of Arms/Hands: None  Permission Sought/Granted Permission sought to share information with : Family Supports Permission granted to share information with : Yes, Verbal Permission Granted  Share Information with NAME: daughter Colletta Maryland, sister Langley Gauss  Permission granted to share info w AGENCY: SNF        Emotional Assessment Appearance:: Appears stated age Attitude/Demeanor/Rapport: Engaged Affect (typically observed): Appropriate, Pleasant Orientation: :  (not recored, appears oriented)      Admission diagnosis:  Traumatic retroperitoneal hematoma [id number] Intramuscular hematoma [T14.8XXA] Closed fracture of multiple ribs of right side, initial encounter [S22.41XA] Patient Active Problem List   Diagnosis Date Noted   Traumatic retroperitoneal hematoma 05/30/2022   Traumatic hematoma of abdominal wall 05/30/2022   Multiple closed fractures of ribs of right side  05/30/2022   Hematoma of muscle - left quadriceps muscle, left iliacus muscle 05/30/2022   Sacral fracture, closed ([place]) 05/30/2022   Hypokalemia 02/20/2022   Hypophosphatemia 02/20/2022   Elevated INR 02/20/2022   Chronic atrial fibrillation with RVR ([place]) 03/04/2021    Lupus anticoagulant syndrome ([place]) 12/15/2020   CHF (congestive heart failure) ([place]) 09/04/2020   Mitral valve stenosis    Essential hypertension 06/27/2017   History of lupus anticoagulant disorder 03/31/2017   NICM (nonischemic cardiomyopathy) ([place]) 03/31/2017   History of MRSA infection    Popliteal artery embolus ([place]) 02/16/2017   Encounter for therapeutic drug monitoring 06/11/2013   History of CVA (cerebrovascular accident) 11/18/2010   Long term (current) use of anticoagulants 08/09/2010   Morbid obesity with BMI of 40.0-44.9, adult ([place]) 08/14/2008   Permanent atrial fibrillation ([place]) 03/07/2008   History of pulmonary embolism 08/30/2005   PCP:  Josetta Huddle, MD Pharmacy:   [facility] ([place]), [place] [id number] [place] ([place]) [place] [address] Phone: [phone] Fax: [phone]  [place], [place] [address] Phone: [phone] Fax: [phone]     Social Determinants of Health (SDOH) Social History: SDOH Screenings   Food Insecurity: No Food Insecurity (05/31/2022)  Housing: Low Risk  (05/31/2022)  Transportation Needs: No Transportation Needs (05/31/2022)  Utilities: Not At Risk (05/31/2022)  Tobacco Use: Low Risk  (05/30/2022)   SDOH Interventions:     Readmission Risk Interventions     No data to display

## 2022-05-31 NOTE — TOC Initial Note (Addendum)
Transition of Care [facility]) - Initial/Assessment Note    Patient Details  Name: [name] MRN: [id number] Date of Birth: 1945/08/04  [facility]) CM/SW Contact:    Carles Collet, RN Phone Number: 05/31/2022, 2:02 PM  Clinical Narrative:                  [name] w patient at bedside, reviewed MOON letter and discussed PT recs. She states she would like to DC to SNF, she states that she went to [facility] in the past and that she would like to try there, "They were really good." Updated CSW Patient has [facility] banner for SNF under observation  Expected Discharge Plan: Skilled Nursing Facility Barriers to Discharge: Continued Medical Work up   Patient Goals and CMS Choice            Expected Discharge Plan and Services In-house Referral: Clinical Social Work Discharge Planning Services: CM Consult   Living arrangements for the past 2 months: [place]                                      Prior Living Arrangements/Services Living arrangements for the past 2 months: [place] with:: Self                   Activities of Daily Living Home Assistive Devices/Equipment: Radio producer (specify quad or straight), Walker (specify type) ADL Screening (condition at time of admission) Patient's cognitive ability adequate to safely complete daily activities?: Yes Is the patient deaf or have difficulty hearing?: No Does the patient have difficulty seeing, even when wearing glasses/contacts?: No Does the patient have difficulty concentrating, remembering, or making decisions?: No Patient able to express need for assistance with ADLs?: Yes Does the patient have difficulty dressing or bathing?: No Independently performs ADLs?: Yes (appropriate for developmental age) Does the patient have difficulty walking or climbing stairs?: No Weakness of Legs: Left Weakness of Arms/Hands: None  Permission Sought/Granted                  Emotional  Assessment              Admission diagnosis:  Traumatic retroperitoneal hematoma [S36.892A] Intramuscular hematoma [T14.8XXA] Closed fracture of multiple ribs of right side, initial encounter [S22.41XA] Patient Active Problem List   Diagnosis Date Noted   Traumatic retroperitoneal hematoma 05/30/2022   Traumatic hematoma of abdominal wall 05/30/2022   Multiple closed fractures of ribs of right side 05/30/2022   Hematoma of muscle - left quadriceps muscle, left iliacus muscle 05/30/2022   Sacral fracture, closed ([place]) 05/30/2022   Hypokalemia 02/20/2022   Hypophosphatemia 02/20/2022   Elevated INR 02/20/2022   Chronic atrial fibrillation with RVR ([place]) 03/04/2021   Lupus anticoagulant syndrome ([place]) 12/15/2020   CHF (congestive heart failure) ([place]) 09/04/2020   Mitral valve stenosis    Essential hypertension 06/27/2017   History of lupus anticoagulant disorder 03/31/2017   NICM (nonischemic cardiomyopathy) ([place]) 03/31/2017   History of MRSA infection    Popliteal artery embolus ([place]) 02/16/2017   Encounter for therapeutic drug monitoring 06/11/2013   History of CVA (cerebrovascular accident) 11/18/2010   Long term (current) use of anticoagulants 08/09/2010   Morbid obesity with BMI of 40.0-44.9, adult ([place]) 08/14/2008   Permanent atrial fibrillation ([place]) 03/07/2008   History of pulmonary embolism 08/30/2005   PCP:  Josetta Huddle, MD Pharmacy:  [facility] ([address] [place] - [id number] [place] [address] Phone: [phone] Fax: [phone]  [place], [place] [address] Phone: [phone] Fax: [phone]     Social Determinants of Health (SDOH) Social History: SDOH Screenings   Food Insecurity: No Food Insecurity (05/31/2022)  Housing: Low Risk  (05/31/2022)  Transportation Needs: No Transportation Needs (05/31/2022)  Utilities:  Not At Risk (05/31/2022)  Tobacco Use: Low Risk  (05/30/2022)   SDOH Interventions:     Readmission Risk Interventions     No data to display

## 2022-05-31 NOTE — Progress Notes (Signed)
   Secure chat with Dr. Redmond Pulling. He recommends ortho consult regarding sacral fracture.  Kristopher Oppenheim, DO [facility]

## 2022-05-31 NOTE — Plan of Care (Signed)
  Problem: Health Behavior/Discharge Planning: Goal: Ability to manage health-related needs will improve Outcome: Progressing   Problem: Activity: Goal: Risk for activity intolerance will decrease Outcome: Progressing   Problem: Coping: Goal: Level of anxiety will decrease Outcome: Progressing   

## 2022-05-31 NOTE — NC FL2 (Signed)
[place] LEVEL OF CARE FORM     IDENTIFICATION  Patient Name: [name] Birthdate: 1945-08-26 Sex: female Admission Date (Current Location): 05/30/2022  [facility] and [place] Number:  Herbalist and Address:  [facility] [place], [place] [address], [place], [place] [address]      Provider Number: [id number]  Attending Physician Name and Address:  Terrilee Croak, MD  Relative Name and Phone Number:  [name] Daughter [phone]    Current Level of Care: Hospital Recommended Level of Care: [place] Prior Approval Number:    Date Approved/Denied:   PASRR Number: [id number]  Discharge Plan: SNF    Current Diagnoses: Patient Active Problem List   Diagnosis Date Noted   Traumatic retroperitoneal hematoma 05/30/2022   Traumatic hematoma of abdominal wall 05/30/2022   Multiple closed fractures of ribs of right side 05/30/2022   Hematoma of muscle - left quadriceps muscle, left iliacus muscle 05/30/2022   Sacral fracture, closed ([place]) 05/30/2022   Hypokalemia 02/20/2022   Hypophosphatemia 02/20/2022   Elevated INR 02/20/2022   Chronic atrial fibrillation with RVR ([place]) 03/04/2021   Lupus anticoagulant syndrome ([place]) 12/15/2020   CHF (congestive heart failure) ([place]) 09/04/2020   Mitral valve stenosis    Essential hypertension 06/27/2017   History of lupus anticoagulant disorder 03/31/2017   NICM (nonischemic cardiomyopathy) ([place]) 03/31/2017   History of MRSA infection    Popliteal artery embolus ([place]) 02/16/2017   Encounter for therapeutic drug monitoring 06/11/2013   History of CVA (cerebrovascular accident) 11/18/2010   Long term (current) use of anticoagulants 08/09/2010   Morbid obesity with BMI of 40.0-44.9, adult ([place]) 08/14/2008   Permanent atrial fibrillation ([place]) 03/07/2008   History of pulmonary embolism 08/30/2005    Orientation RESPIRATION BLADDER Height & Weight     Self, Time, Situation,  Place  Normal Continent Weight: 248 lb 7.3 oz (112.7 kg) Height:  '5\' 5"'$  (165.1 cm)  BEHAVIORAL SYMPTOMS/MOOD NEUROLOGICAL BOWEL NUTRITION STATUS      Continent Diet (see discharge summary)  AMBULATORY STATUS COMMUNICATION OF NEEDS Skin   Total Care Verbally Normal                       Personal Care Assistance Level of Assistance  Bathing, Feeding, Dressing Bathing Assistance: Limited assistance Feeding assistance: Limited assistance Dressing Assistance: Limited assistance     Functional Limitations Info  Sight, Hearing, Speech Sight Info: Adequate Hearing Info: Adequate Speech Info: Adequate    SPECIAL CARE FACTORS FREQUENCY  PT (By licensed PT), OT (By licensed OT)     PT Frequency: 5x week OT Frequency: 5x week            Contractures Contractures Info: Not present    Additional Factors Info  Code Status, Allergies Code Status Info: full Allergies Info: Heparin, Other           Current Medications (05/31/2022):  This is the current hospital active medication list Current Facility-Administered Medications  Medication Dose Route Frequency Provider Last Rate Last Admin   acetaminophen (TYLENOL) tablet 1,000 mg  1,000 mg Oral TID Terrilee Croak, MD   1,000 mg at 05/31/22 1131   FLUoxetine (PROZAC) capsule 20 mg  20 mg Oral Daily Kristopher Oppenheim, DO   20 mg at 05/31/22 [id number]   hydrALAZINE (APRESOLINE) injection 10 mg  10 mg Intravenous Q6H PRN Dahal, Binaya, MD       lidocaine (LIDODERM) 5 % 1 patch  1  patch Transdermal Q24H Terrilee Croak, MD   1 patch at 05/31/22 1131   metoprolol tartrate (LOPRESSOR) tablet 25 mg  25 mg Oral BID Kristopher Oppenheim, DO   25 mg at 05/31/22 0443   morphine (PF) 2 MG/ML injection 1 mg  1 mg Intravenous Q4H PRN Dahal, Marlowe Aschoff, MD       ondansetron (ZOFRAN) tablet 4 mg  4 mg Oral Q6H PRN Kristopher Oppenheim, DO       Or   ondansetron [facility]) injection 4 mg  4 mg Intravenous Q6H PRN Kristopher Oppenheim, DO       oxyCODONE (Oxy IR/ROXICODONE) immediate release  tablet 5 mg  5 mg Oral Q6H PRN Dahal, Binaya, MD       potassium chloride (KLOR-CON) CR tablet 10 mEq  10 mEq Oral Daily Kristopher Oppenheim, DO   10 mEq at 05/31/22 0725   Warfarin - Pharmacist Dosing Inpatient   Does not apply Pass Christian, DO         Discharge Medications: Please see discharge summary for a list of discharge medications.  Relevant Imaging Results:  Relevant Lab Results:   Additional Information SSN: [id number], Pt is vaccinated for covid with booster.  Joanne Chars, LCSW

## 2022-05-31 NOTE — Progress Notes (Signed)
Patient evaluated in the afternoon. She has worked with therapies and orthopedics has evaluated - no intervention recommended. She is doing well with pain control - only needing oral meds and lidocaine patch. She denies pain from rib fractures or shortness of breath. She pulls 750 on IS. Most recent hgb 8.5 from 8.3 from 10 which is not unexpected with hematomas and coumadin. Expect this to continue to stabilize and transfusion prn per primary. Trauma team will sign off but please do not hesitate to contact us with any questions or concerns or reconsult if needed.  Winferd Humphrey, [facility] 05/31/2022, 2:30 PM Please see Amion for pager number during day hours 7:00am-4:30pm
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
## 2022-05-31 NOTE — Care Management Obs Status (Signed)
[facility] NOTIFICATION   Patient Details  Name: [name] MRN: [id number] Date of Birth: Feb 09, 1946   Medicare Observation Status Notification Given:  Yes    Carles Collet, RN 05/31/2022, 2:12 PM
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
## 2022-06-01 DIAGNOSIS — S36892A Contusion of other intra-abdominal organs, initial encounter: Secondary | ICD-10-CM | POA: Diagnosis not present

## 2022-06-01 LAB — CBC WITH DIFFERENTIAL/PLATELET
Abs Immature Granulocytes: 0.06 10*3/uL (ref 0.00–0.07)
Basophils Absolute: 0 10*3/uL (ref 0.0–0.1)
Basophils Relative: 0 %
Eosinophils Absolute: 0.4 10*3/uL (ref 0.0–0.5)
Eosinophils Relative: 4 %
HCT: 26.5 % — ABNORMAL LOW (ref 36.0–46.0)
Hemoglobin: 8.5 g/dL — ABNORMAL LOW (ref 12.0–15.0)
Immature Granulocytes: 1 %
Lymphocytes Relative: 4 %
Lymphs Abs: 0.5 10*3/uL — ABNORMAL LOW (ref 0.7–4.0)
MCH: 33.3 pg (ref 26.0–34.0)
MCHC: 32.1 g/dL (ref 30.0–36.0)
MCV: 103.9 fL — ABNORMAL HIGH (ref 80.0–100.0)
Monocytes Absolute: 0.8 10*3/uL (ref 0.1–1.0)
Monocytes Relative: 7 %
Neutro Abs: 9.6 10*3/uL — ABNORMAL HIGH (ref 1.7–7.7)
Neutrophils Relative %: 84 %
Platelets: 178 10*3/uL (ref 150–400)
RBC: 2.55 MIL/uL — ABNORMAL LOW (ref 3.87–5.11)
RDW: 13.2 % (ref 11.5–15.5)
WBC: 11.3 10*3/uL — ABNORMAL HIGH (ref 4.0–10.5)
nRBC: 0.4 % — ABNORMAL HIGH (ref 0.0–0.2)

## 2022-06-01 LAB — PROTIME-INR
INR: 5.5 (ref 0.8–1.2)
Prothrombin Time: 49.8 seconds — ABNORMAL HIGH (ref 11.4–15.2)

## 2022-06-01 LAB — BASIC METABOLIC PANEL
Anion gap: 8 (ref 5–15)
BUN: 17 mg/dL (ref 8–23)
CO2: 28 mmol/L (ref 22–32)
Calcium: 8.3 mg/dL — ABNORMAL LOW (ref 8.9–10.3)
Chloride: 102 mmol/L (ref 98–111)
Creatinine, Ser: 1.17 mg/dL — ABNORMAL HIGH (ref 0.44–1.00)
GFR, Estimated: 48 mL/min — ABNORMAL LOW (ref >60)
Glucose, Bld: 120 mg/dL — ABNORMAL HIGH (ref 70–99)
Potassium: 3.8 mmol/L (ref 3.5–5.1)
Sodium: 138 mmol/L (ref 135–145)

## 2022-06-01 LAB — GLUCOSE, CAPILLARY: Glucose-Capillary: 157 mg/dL — ABNORMAL HIGH (ref 70–99)

## 2022-06-01 MED ORDER — DIPHENHYDRAMINE-ZINC ACETATE 2-0.1 % EX CREA
TOPICAL_CREAM | Freq: Two times a day (BID) | CUTANEOUS | Status: DC | PRN
  Filled 2022-06-01: qty 28
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
## 2022-06-01 NOTE — TOC Progression Note (Signed)
Transition of Care [facility]) - Progression Note    Patient Details  Name: [name] MRN: [id number] Date of Birth: Sep 26, 1945  [facility] of [facility]) CM/SW Contact  Joanne Chars, LCSW Phone Number: 06/01/2022, 2:43 PM  Clinical Narrative:  No SNF bed offers.  Pt now has inpatient order from 05/31/22.  Referral sent out to additional SNF facilities in the hub.       Expected Discharge Plan: Skilled Nursing Facility Barriers to Discharge: SNF Pending bed offer  Expected Discharge Plan and Services In-house Referral: Clinical Social Work Discharge Planning Services: CM Consult Post Acute Care Choice: [place] arrangements for the past 2 months: Single Family Home                                       Social Determinants of Health (SDOH) Interventions SDOH Screenings   Food Insecurity: No Food Insecurity (05/31/2022)  Housing: Low Risk  (05/31/2022)  Transportation Needs: No Transportation Needs (05/31/2022)  Utilities: Not At Risk (05/31/2022)  Tobacco Use: Low Risk  (05/30/2022)    Readmission Risk Interventions     No data to display
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
## 2022-06-02 DIAGNOSIS — S36892A Contusion of other intra-abdominal organs, initial encounter: Secondary | ICD-10-CM | POA: Diagnosis not present

## 2022-06-02 LAB — PROTIME-INR
INR: 3.8 — ABNORMAL HIGH (ref 0.8–1.2)
Prothrombin Time: 37.5 seconds — ABNORMAL HIGH (ref 11.4–15.2)

## 2022-06-02 MED ORDER — SODIUM CHLORIDE 0.9 % IV SOLN: INTRAVENOUS | Status: AC

## 2022-06-02 MED ORDER — WARFARIN SODIUM 3 MG PO TABS
3.0000 mg | ORAL_TABLET | Freq: Once | ORAL | Status: AC
  Administered 2022-06-02: 3 mg via ORAL
  Filled 2022-06-02: qty 1

## 2022-06-02 NOTE — TOC Progression Note (Signed)
Transition of Care [facility]) - Progression Note    Patient Details  Name: HATTYE CASTER MRN: [id number] Date of Birth: 1945/06/26  [facility] CM/SW Contact  Joanne Chars, LCSW Phone Number: 06/02/2022, 12:56 PM  Clinical Narrative:   CSW met with pt and presented bed offers on medicare choice document.  Pt will discuss with her daughter and make decision.  CSW also attempted to call daughter, LM.    Expected Discharge Plan: Skilled Nursing Facility Barriers to Discharge: SNF Pending bed offer  Expected Discharge Plan and Services In-house Referral: Clinical Social Work Discharge Planning Services: CM Consult Post Acute Care Choice: [place] arrangements for the past 2 months: Single Family Home                                       Social Determinants of Health (SDOH) Interventions SDOH Screenings   Food Insecurity: No Food Insecurity (05/31/2022)  Housing: Low Risk  (05/31/2022)  Transportation Needs: No Transportation Needs (05/31/2022)  Utilities: Not At Risk (05/31/2022)  Tobacco Use: Low Risk  (05/30/2022)    Readmission Risk Interventions     No data to display
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
## 2022-06-02 NOTE — Plan of Care (Signed)
  Problem: Education: Goal: Knowledge of General Education information will improve Description: Including pain rating scale, medication(s)/side effects and non-pharmacologic comfort measures Outcome: Progressing   Problem: Activity: Goal: Risk for activity intolerance will decrease Outcome: Progressing   Problem: Pain Managment: Goal: General experience of comfort will improve Outcome: Progressing   
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
## 2022-06-02 NOTE — Plan of Care (Signed)
  Problem: Education: Goal: Knowledge of General Education information will improve Description: Including pain rating scale, medication(s)/side effects and non-pharmacologic comfort measures Outcome: Progressing   Problem: Clinical Measurements: Goal: Diagnostic test results will improve Outcome: Progressing   Problem: Activity: Goal: Risk for activity intolerance will decrease Outcome: Progressing   Problem: Skin Integrity: Goal: Risk for impaired skin integrity will decrease Outcome: Progressing

## 2022-06-03 DIAGNOSIS — R531 Weakness: Secondary | ICD-10-CM | POA: Diagnosis not present

## 2022-06-03 DIAGNOSIS — M6289 Other specified disorders of muscle: Secondary | ICD-10-CM | POA: Diagnosis not present

## 2022-06-03 DIAGNOSIS — Z7401 Bed confinement status: Secondary | ICD-10-CM | POA: Diagnosis not present

## 2022-06-03 DIAGNOSIS — S2241XD Multiple fractures of ribs, right side, subsequent encounter for fracture with routine healing: Secondary | ICD-10-CM | POA: Diagnosis not present

## 2022-06-03 DIAGNOSIS — I428 Other cardiomyopathies: Secondary | ICD-10-CM | POA: Diagnosis not present

## 2022-06-03 DIAGNOSIS — Z743 Need for continuous supervision: Secondary | ICD-10-CM | POA: Diagnosis not present

## 2022-06-03 DIAGNOSIS — S36892A Contusion of other intra-abdominal organs, initial encounter: Secondary | ICD-10-CM | POA: Diagnosis not present

## 2022-06-03 DIAGNOSIS — L299 Pruritus, unspecified: Secondary | ICD-10-CM | POA: Diagnosis not present

## 2022-06-03 DIAGNOSIS — Z86718 Personal history of other venous thrombosis and embolism: Secondary | ICD-10-CM | POA: Diagnosis not present

## 2022-06-03 DIAGNOSIS — E559 Vitamin D deficiency, unspecified: Secondary | ICD-10-CM | POA: Diagnosis not present

## 2022-06-03 DIAGNOSIS — I6782 Cerebral ischemia: Secondary | ICD-10-CM | POA: Diagnosis not present

## 2022-06-03 DIAGNOSIS — D6862 Lupus anticoagulant syndrome: Secondary | ICD-10-CM | POA: Diagnosis not present

## 2022-06-03 DIAGNOSIS — F419 Anxiety disorder, unspecified: Secondary | ICD-10-CM | POA: Diagnosis not present

## 2022-06-03 DIAGNOSIS — R051 Acute cough: Secondary | ICD-10-CM | POA: Diagnosis not present

## 2022-06-03 DIAGNOSIS — M79605 Pain in left leg: Secondary | ICD-10-CM | POA: Diagnosis not present

## 2022-06-03 DIAGNOSIS — R29898 Other symptoms and signs involving the musculoskeletal system: Secondary | ICD-10-CM | POA: Diagnosis not present

## 2022-06-03 DIAGNOSIS — I5022 Chronic systolic (congestive) heart failure: Secondary | ICD-10-CM | POA: Diagnosis not present

## 2022-06-03 DIAGNOSIS — J069 Acute upper respiratory infection, unspecified: Secondary | ICD-10-CM | POA: Diagnosis not present

## 2022-06-03 DIAGNOSIS — Z86711 Personal history of pulmonary embolism: Secondary | ICD-10-CM | POA: Diagnosis not present

## 2022-06-03 DIAGNOSIS — I10 Essential (primary) hypertension: Secondary | ICD-10-CM | POA: Diagnosis not present

## 2022-06-03 DIAGNOSIS — Z7901 Long term (current) use of anticoagulants: Secondary | ICD-10-CM | POA: Diagnosis not present

## 2022-06-03 DIAGNOSIS — M6281 Muscle weakness (generalized): Secondary | ICD-10-CM | POA: Diagnosis not present

## 2022-06-03 DIAGNOSIS — B372 Candidiasis of skin and nail: Secondary | ICD-10-CM | POA: Diagnosis not present

## 2022-06-03 DIAGNOSIS — M159 Polyosteoarthritis, unspecified: Secondary | ICD-10-CM | POA: Diagnosis not present

## 2022-06-03 DIAGNOSIS — R059 Cough, unspecified: Secondary | ICD-10-CM | POA: Diagnosis not present

## 2022-06-03 DIAGNOSIS — E8809 Other disorders of plasma-protein metabolism, not elsewhere classified: Secondary | ICD-10-CM | POA: Diagnosis not present

## 2022-06-03 DIAGNOSIS — G4700 Insomnia, unspecified: Secondary | ICD-10-CM | POA: Diagnosis not present

## 2022-06-03 DIAGNOSIS — F411 Generalized anxiety disorder: Secondary | ICD-10-CM | POA: Diagnosis not present

## 2022-06-03 DIAGNOSIS — S3210XD Unspecified fracture of sacrum, subsequent encounter for fracture with routine healing: Secondary | ICD-10-CM | POA: Diagnosis not present

## 2022-06-03 DIAGNOSIS — D509 Iron deficiency anemia, unspecified: Secondary | ICD-10-CM | POA: Diagnosis not present

## 2022-06-03 DIAGNOSIS — I4821 Permanent atrial fibrillation: Secondary | ICD-10-CM | POA: Diagnosis not present

## 2022-06-03 DIAGNOSIS — I342 Nonrheumatic mitral (valve) stenosis: Secondary | ICD-10-CM | POA: Diagnosis not present

## 2022-06-03 DIAGNOSIS — R2689 Other abnormalities of gait and mobility: Secondary | ICD-10-CM | POA: Diagnosis not present

## 2022-06-03 DIAGNOSIS — R0989 Other specified symptoms and signs involving the circulatory and respiratory systems: Secondary | ICD-10-CM | POA: Diagnosis not present

## 2022-06-03 DIAGNOSIS — S301XXD Contusion of abdominal wall, subsequent encounter: Secondary | ICD-10-CM | POA: Diagnosis not present

## 2022-06-03 DIAGNOSIS — L209 Atopic dermatitis, unspecified: Secondary | ICD-10-CM | POA: Diagnosis not present

## 2022-06-03 DIAGNOSIS — Z8673 Personal history of transient ischemic attack (TIA), and cerebral infarction without residual deficits: Secondary | ICD-10-CM | POA: Diagnosis not present

## 2022-06-03 DIAGNOSIS — E669 Obesity, unspecified: Secondary | ICD-10-CM | POA: Diagnosis not present

## 2022-06-03 DIAGNOSIS — K683 Retroperitoneal hematoma: Secondary | ICD-10-CM | POA: Diagnosis not present

## 2022-06-03 DIAGNOSIS — F339 Major depressive disorder, recurrent, unspecified: Secondary | ICD-10-CM | POA: Diagnosis not present

## 2022-06-03 DIAGNOSIS — F5105 Insomnia due to other mental disorder: Secondary | ICD-10-CM | POA: Diagnosis not present

## 2022-06-03 LAB — PROTIME-INR
INR: 3.6 — ABNORMAL HIGH (ref 0.8–1.2)
Prothrombin Time: 35.5 seconds — ABNORMAL HIGH (ref 11.4–15.2)

## 2022-06-03 MED ORDER — WARFARIN SODIUM 3 MG PO TABS: 4.0000 mg | ORAL_TABLET | ORAL | Status: DC

## 2022-06-03 MED ORDER — OXYCODONE HCL 5 MG PO TABS: 5.0000 mg | ORAL_TABLET | Freq: Four times a day (QID) | ORAL | 0 refills | Status: AC | PRN

## 2022-06-03 MED ORDER — FUROSEMIDE 40 MG PO TABS: 40.0000 mg | ORAL_TABLET | Freq: Every day | ORAL | 0 refills | Status: DC | PRN

## 2022-06-03 MED ORDER — ACETAMINOPHEN 500 MG PO TABS: 1000.0000 mg | ORAL_TABLET | Freq: Three times a day (TID) | ORAL | 0 refills | Status: AC

## 2022-06-03 MED ORDER — WARFARIN SODIUM 6 MG PO TABS
6.0000 mg | ORAL_TABLET | Freq: Once | ORAL | Status: DC
  Filled 2022-06-03: qty 1
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
## 2022-06-03 NOTE — TOC Progression Note (Addendum)
Transition of Care [facility]) - Progression Note    Patient Details  Name: [name] MRN: [id number] Date of Birth: October 14, 1945  Transition of Care [facility]) CM/SW Contact  Joanne Chars, LCSW Phone Number: 06/03/2022, 8:57 AM  Clinical Narrative:   CSW spoke with pt regarding SNF choice.  She said her daughter wants to visit SNF.  Discussed potential DC today.  Pt attempted to call daughter but she did not answer.  CSW reached out to daughter as well.  0900: TC Daughter Colletta [place], discussed bed offers and she will call back.  1000: TC Daughter Colletta [place].  She wants to accept offer at [facility].  [facility] informed.   1150: [facility] confirms can accept pt today.    1200: CSW discussed providing transportation to [facility] with [place] and she can do this, will be at main entrance in about an hour.   1310: CSW called to room, daughter Colletta [place] there, states pt cannot move enough that she feels it is safe for her to transport by private vehicle.  CSW reviewed PT note, which does say pt cleared to go by private vehicle, discussed that pt could be billed if PTAR is used but pt and daughter both prefer to transport by PTAR, agreeable to pay if they do receive bill.     Expected Discharge Plan: Skilled Nursing Facility Barriers to Discharge: SNF Pending bed offer  Expected Discharge Plan and Services In-house Referral: Clinical Social Work Discharge Planning Services: CM Consult Post Acute Care Choice: [place] arrangements for the past 2 months: Single Family Home                                       Social Determinants of Health (SDOH) Interventions SDOH Screenings   Food Insecurity: No Food Insecurity (05/31/2022)  Housing: Low Risk  (05/31/2022)  Transportation Needs: No Transportation Needs (05/31/2022)  Utilities: Not At Risk (05/31/2022)  Tobacco Use: Low Risk  (05/30/2022)    Readmission Risk Interventions     No data to display

## 2022-06-03 NOTE — Progress Notes (Signed)
Mobility Specialist Progress Note   06/03/22 0945  Mobility  Activity Dangled on edge of bed;Stood at bedside  Level of Assistance Moderate assist, patient does 50-74%  Assistive Device Front wheel walker  RLE Weight Bearing WBAT  LLE Weight Bearing WBAT   Patient received in supine and pleasantly agreeable to participate. Required min A for bed mobility and stood with min-mod A + cues for hand placement. Completed x3 STS total. Tolerated without complaint or incident. Was left in supine with all needs met, call bell in reach.   Martinique Halden Phegley, BS EXP Mobility Specialist Please contact via SecureChat or Rehab office at [phone]

## 2022-06-03 NOTE — Progress Notes (Signed)
Report is given to the nurse [facility] from [facility].

## 2022-06-03 NOTE — Plan of Care (Signed)
  Problem: Education: Goal: Knowledge of General Education information will improve Description: Including pain rating scale, medication(s)/side effects and non-pharmacologic comfort measures Outcome: Progressing   Problem: Activity: Goal: Risk for activity intolerance will decrease Outcome: Progressing   Problem: Nutrition: Goal: Adequate nutrition will be maintained Outcome: Progressing   

## 2022-06-03 NOTE — Care Management Important Message (Signed)
Important Message  Patient Details  Name: [name] MRN: [id number] Date of Birth: 08/30/1945   Medicare Important Message Given:  Yes     Hannah Beat 06/03/2022, 12:23 PM

## 2022-06-03 NOTE — TOC Transition Note (Signed)
Transition of Care [facility]) - CM/SW Discharge Note   Patient Details  Name: [name] MRN: [id number] Date of Birth: July 17, 1945  Transition of Care [facility]) CM/SW Contact:  Joanne Chars, LCSW Phone Number: 06/03/2022, 12:03 PM   Clinical Narrative:   Pt discharging to [facility], room [place] call report to [phone].  Pt will be transported by daughter [name].  She come to the main [facility] tower entrance and call when she is close, will need pt brought down and assistance getting her into the vehicle.  Final next level of care: Skilled Nursing Facility Barriers to Discharge: Barriers Resolved   Patient Goals and CMS Choice   Choice offered to / list presented to : Patient  Discharge Placement                Patient chooses bed at: [facility] Patient to be transferred to facility by: daughter [name] Name of family member notified: daughter [name] Patient and family notified of of transfer: 06/03/22  Discharge Plan and Services Additional resources added to the After Visit Summary for   In-house Referral: Clinical Social Work Discharge Planning Services: [employer] Consult Post Acute Care Choice: [place]                               Social Determinants of Health (SDOH) Interventions SDOH Screenings   Food Insecurity: No Food Insecurity (05/31/2022)  Housing: Low Risk  (05/31/2022)  Transportation Needs: No Transportation Needs (05/31/2022)  Utilities: Not At Risk (05/31/2022)  Tobacco Use: Low Risk  (05/30/2022)     Readmission Risk Interventions     No data to display
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
## 2022-06-06 DIAGNOSIS — E559 Vitamin D deficiency, unspecified: Secondary | ICD-10-CM | POA: Diagnosis not present

## 2022-06-06 DIAGNOSIS — I4821 Permanent atrial fibrillation: Secondary | ICD-10-CM | POA: Diagnosis not present

## 2022-06-06 DIAGNOSIS — F339 Major depressive disorder, recurrent, unspecified: Secondary | ICD-10-CM | POA: Diagnosis not present

## 2022-06-06 DIAGNOSIS — I5022 Chronic systolic (congestive) heart failure: Secondary | ICD-10-CM | POA: Diagnosis not present

## 2022-06-06 DIAGNOSIS — D6862 Lupus anticoagulant syndrome: Secondary | ICD-10-CM | POA: Diagnosis not present

## 2022-06-06 DIAGNOSIS — I10 Essential (primary) hypertension: Secondary | ICD-10-CM | POA: Diagnosis not present

## 2022-06-06 DIAGNOSIS — E669 Obesity, unspecified: Secondary | ICD-10-CM | POA: Diagnosis not present

## 2022-06-06 DIAGNOSIS — D509 Iron deficiency anemia, unspecified: Secondary | ICD-10-CM | POA: Diagnosis not present

## 2022-06-07 ENCOUNTER — Telehealth: Payer: Self-pay | Admitting: *Deleted

## 2022-06-07 ENCOUNTER — Ambulatory Visit: Payer: Medicare Other | Attending: Cardiovascular Disease

## 2022-06-07 NOTE — Telephone Encounter (Signed)
Called pt since she missed her appt in the Anticoagulation Clinic today. There was no voicemail so unable to leave a message. Will try back at a later date/time.

## 2022-06-08 ENCOUNTER — Other Ambulatory Visit: Payer: Self-pay | Admitting: *Deleted

## 2022-06-08 DIAGNOSIS — M79605 Pain in left leg: Secondary | ICD-10-CM | POA: Diagnosis not present

## 2022-06-08 NOTE — Patient Outreach (Signed)
Ms. Wrightsman resides in [facility] skilled nursing facility. Screening for potential care coordination services as benefit of health plan and Primary Care Provider.  Update received from [place], [facility] social worker. Ms. Tabora is from home alone. Plans to return home post SNF.   PCP office [facility] has [facility] care management.   Will continue to follow.   Marthenia Rolling, MSN, RN,BSN [place] Acute Care Coordinator [phone] (Direct dial)

## 2022-06-09 ENCOUNTER — Telehealth: Payer: Self-pay | Admitting: *Deleted

## 2022-06-09 DIAGNOSIS — M79605 Pain in left leg: Secondary | ICD-10-CM | POA: Diagnosis not present

## 2022-06-09 NOTE — Telephone Encounter (Signed)
Called pt since she needs an appt with Anticoagulation Clinic; there was no answer and no voicemail set up. Will try back at a later date/time.

## 2022-06-10 DIAGNOSIS — I4821 Permanent atrial fibrillation: Secondary | ICD-10-CM | POA: Diagnosis not present

## 2022-06-10 DIAGNOSIS — F339 Major depressive disorder, recurrent, unspecified: Secondary | ICD-10-CM | POA: Diagnosis not present

## 2022-06-10 DIAGNOSIS — I5022 Chronic systolic (congestive) heart failure: Secondary | ICD-10-CM | POA: Diagnosis not present

## 2022-06-10 DIAGNOSIS — D6862 Lupus anticoagulant syndrome: Secondary | ICD-10-CM | POA: Diagnosis not present

## 2022-06-10 DIAGNOSIS — I10 Essential (primary) hypertension: Secondary | ICD-10-CM | POA: Diagnosis not present

## 2022-06-13 DIAGNOSIS — L209 Atopic dermatitis, unspecified: Secondary | ICD-10-CM | POA: Diagnosis not present

## 2022-06-13 DIAGNOSIS — L299 Pruritus, unspecified: Secondary | ICD-10-CM | POA: Diagnosis not present

## 2022-06-14 DIAGNOSIS — L209 Atopic dermatitis, unspecified: Secondary | ICD-10-CM | POA: Diagnosis not present

## 2022-06-14 DIAGNOSIS — L299 Pruritus, unspecified: Secondary | ICD-10-CM | POA: Diagnosis not present

## 2022-06-16 DIAGNOSIS — Z7901 Long term (current) use of anticoagulants: Secondary | ICD-10-CM | POA: Diagnosis not present

## 2022-06-16 DIAGNOSIS — I4821 Permanent atrial fibrillation: Secondary | ICD-10-CM | POA: Diagnosis not present

## 2022-06-17 DIAGNOSIS — L209 Atopic dermatitis, unspecified: Secondary | ICD-10-CM | POA: Diagnosis not present

## 2022-06-17 DIAGNOSIS — L299 Pruritus, unspecified: Secondary | ICD-10-CM | POA: Diagnosis not present

## 2022-06-20 DIAGNOSIS — Z7901 Long term (current) use of anticoagulants: Secondary | ICD-10-CM | POA: Diagnosis not present

## 2022-06-20 DIAGNOSIS — R0989 Other specified symptoms and signs involving the circulatory and respiratory systems: Secondary | ICD-10-CM | POA: Diagnosis not present

## 2022-06-20 DIAGNOSIS — L209 Atopic dermatitis, unspecified: Secondary | ICD-10-CM | POA: Diagnosis not present

## 2022-06-20 DIAGNOSIS — R051 Acute cough: Secondary | ICD-10-CM | POA: Diagnosis not present

## 2022-06-21 DIAGNOSIS — L209 Atopic dermatitis, unspecified: Secondary | ICD-10-CM | POA: Diagnosis not present

## 2022-06-21 DIAGNOSIS — J069 Acute upper respiratory infection, unspecified: Secondary | ICD-10-CM | POA: Diagnosis not present

## 2022-06-21 DIAGNOSIS — L299 Pruritus, unspecified: Secondary | ICD-10-CM | POA: Diagnosis not present

## 2022-06-22 DIAGNOSIS — L299 Pruritus, unspecified: Secondary | ICD-10-CM | POA: Diagnosis not present

## 2022-06-22 DIAGNOSIS — L209 Atopic dermatitis, unspecified: Secondary | ICD-10-CM | POA: Diagnosis not present

## 2022-06-22 DIAGNOSIS — J069 Acute upper respiratory infection, unspecified: Secondary | ICD-10-CM | POA: Diagnosis not present

## 2022-06-23 DIAGNOSIS — J069 Acute upper respiratory infection, unspecified: Secondary | ICD-10-CM | POA: Diagnosis not present

## 2022-06-23 DIAGNOSIS — L209 Atopic dermatitis, unspecified: Secondary | ICD-10-CM | POA: Diagnosis not present

## 2022-06-24 DIAGNOSIS — Z7901 Long term (current) use of anticoagulants: Secondary | ICD-10-CM | POA: Diagnosis not present

## 2022-06-24 DIAGNOSIS — J069 Acute upper respiratory infection, unspecified: Secondary | ICD-10-CM | POA: Diagnosis not present

## 2022-06-24 DIAGNOSIS — I4821 Permanent atrial fibrillation: Secondary | ICD-10-CM | POA: Diagnosis not present

## 2022-06-27 DIAGNOSIS — J069 Acute upper respiratory infection, unspecified: Secondary | ICD-10-CM | POA: Diagnosis not present

## 2022-06-28 DIAGNOSIS — L299 Pruritus, unspecified: Secondary | ICD-10-CM | POA: Diagnosis not present

## 2022-06-28 DIAGNOSIS — L209 Atopic dermatitis, unspecified: Secondary | ICD-10-CM | POA: Diagnosis not present

## 2022-06-30 DIAGNOSIS — I4821 Permanent atrial fibrillation: Secondary | ICD-10-CM | POA: Diagnosis not present

## 2022-06-30 DIAGNOSIS — Z7901 Long term (current) use of anticoagulants: Secondary | ICD-10-CM | POA: Diagnosis not present

## 2022-07-01 DIAGNOSIS — L299 Pruritus, unspecified: Secondary | ICD-10-CM | POA: Diagnosis not present

## 2022-07-01 DIAGNOSIS — L209 Atopic dermatitis, unspecified: Secondary | ICD-10-CM | POA: Diagnosis not present

## 2022-07-04 DIAGNOSIS — R051 Acute cough: Secondary | ICD-10-CM | POA: Diagnosis not present

## 2022-07-04 DIAGNOSIS — R0989 Other specified symptoms and signs involving the circulatory and respiratory systems: Secondary | ICD-10-CM | POA: Diagnosis not present

## 2022-07-04 DIAGNOSIS — L209 Atopic dermatitis, unspecified: Secondary | ICD-10-CM | POA: Diagnosis not present

## 2022-07-04 DIAGNOSIS — J069 Acute upper respiratory infection, unspecified: Secondary | ICD-10-CM | POA: Diagnosis not present

## 2022-07-06 ENCOUNTER — Other Ambulatory Visit: Payer: Self-pay | Admitting: *Deleted

## 2022-07-06 DIAGNOSIS — L209 Atopic dermatitis, unspecified: Secondary | ICD-10-CM | POA: Diagnosis not present

## 2022-07-06 DIAGNOSIS — L299 Pruritus, unspecified: Secondary | ICD-10-CM | POA: Diagnosis not present

## 2022-07-07 DIAGNOSIS — L299 Pruritus, unspecified: Secondary | ICD-10-CM | POA: Diagnosis not present

## 2022-07-07 DIAGNOSIS — Z7901 Long term (current) use of anticoagulants: Secondary | ICD-10-CM | POA: Diagnosis not present

## 2022-07-07 DIAGNOSIS — L209 Atopic dermatitis, unspecified: Secondary | ICD-10-CM | POA: Diagnosis not present

## 2022-07-07 DIAGNOSIS — I4821 Permanent atrial fibrillation: Secondary | ICD-10-CM | POA: Diagnosis not present

## 2022-07-11 DIAGNOSIS — I4821 Permanent atrial fibrillation: Secondary | ICD-10-CM | POA: Diagnosis not present

## 2022-07-11 DIAGNOSIS — Z7901 Long term (current) use of anticoagulants: Secondary | ICD-10-CM | POA: Diagnosis not present

## 2022-07-14 DIAGNOSIS — L209 Atopic dermatitis, unspecified: Secondary | ICD-10-CM | POA: Diagnosis not present

## 2022-07-14 DIAGNOSIS — L299 Pruritus, unspecified: Secondary | ICD-10-CM | POA: Diagnosis not present

## 2022-07-18 DIAGNOSIS — I4821 Permanent atrial fibrillation: Secondary | ICD-10-CM | POA: Diagnosis not present

## 2022-07-18 DIAGNOSIS — Z7901 Long term (current) use of anticoagulants: Secondary | ICD-10-CM | POA: Diagnosis not present

## 2022-07-20 DIAGNOSIS — M79605 Pain in left leg: Secondary | ICD-10-CM | POA: Diagnosis not present

## 2022-07-20 DIAGNOSIS — R531 Weakness: Secondary | ICD-10-CM | POA: Diagnosis not present

## 2022-07-21 DIAGNOSIS — F339 Major depressive disorder, recurrent, unspecified: Secondary | ICD-10-CM | POA: Diagnosis not present

## 2022-07-21 DIAGNOSIS — F419 Anxiety disorder, unspecified: Secondary | ICD-10-CM | POA: Diagnosis not present

## 2022-07-21 DIAGNOSIS — G4700 Insomnia, unspecified: Secondary | ICD-10-CM | POA: Diagnosis not present

## 2022-07-22 DIAGNOSIS — I4821 Permanent atrial fibrillation: Secondary | ICD-10-CM | POA: Diagnosis not present

## 2022-07-22 DIAGNOSIS — Z7901 Long term (current) use of anticoagulants: Secondary | ICD-10-CM | POA: Diagnosis not present

## 2022-07-26 DIAGNOSIS — Z7901 Long term (current) use of anticoagulants: Secondary | ICD-10-CM | POA: Diagnosis not present

## 2022-07-26 DIAGNOSIS — I4821 Permanent atrial fibrillation: Secondary | ICD-10-CM | POA: Diagnosis not present

## 2022-08-01 DIAGNOSIS — M79605 Pain in left leg: Secondary | ICD-10-CM | POA: Diagnosis not present

## 2022-08-01 DIAGNOSIS — R29898 Other symptoms and signs involving the musculoskeletal system: Secondary | ICD-10-CM | POA: Diagnosis not present

## 2022-08-03 ENCOUNTER — Other Ambulatory Visit: Payer: Self-pay | Admitting: *Deleted

## 2022-08-03 DIAGNOSIS — Z7901 Long term (current) use of anticoagulants: Secondary | ICD-10-CM | POA: Diagnosis not present

## 2022-08-03 DIAGNOSIS — I4821 Permanent atrial fibrillation: Secondary | ICD-10-CM | POA: Diagnosis not present

## 2022-08-03 NOTE — Patient Outreach (Signed)
Per [facility] Ms. Burley resides in [facility] skilled nursing facility. Screening for potential [facility] care coordination services as benefit of health plan and Primary Care Provider.   Update received from [place], [facility] discharge planner indicating home visit is scheduled for next week. Likely transition home soon after home visit.   [facility] office [facility] has [facility] care management services.  Will send notification to [facility] team upon skilled nursing facility discharge.  Will continue to follow.   Raiford Noble, MSN, RN,BSN [facility] Post Acute Care Coordinator [phone] (Direct dial)

## 2022-08-05 DIAGNOSIS — R0989 Other specified symptoms and signs involving the circulatory and respiratory systems: Secondary | ICD-10-CM | POA: Diagnosis not present

## 2022-08-05 DIAGNOSIS — D6862 Lupus anticoagulant syndrome: Secondary | ICD-10-CM | POA: Diagnosis not present

## 2022-08-05 DIAGNOSIS — L209 Atopic dermatitis, unspecified: Secondary | ICD-10-CM | POA: Diagnosis not present

## 2022-08-05 DIAGNOSIS — I5022 Chronic systolic (congestive) heart failure: Secondary | ICD-10-CM | POA: Diagnosis not present

## 2022-08-05 DIAGNOSIS — Z7901 Long term (current) use of anticoagulants: Secondary | ICD-10-CM | POA: Diagnosis not present

## 2022-08-05 DIAGNOSIS — L299 Pruritus, unspecified: Secondary | ICD-10-CM | POA: Diagnosis not present

## 2022-08-05 DIAGNOSIS — J069 Acute upper respiratory infection, unspecified: Secondary | ICD-10-CM | POA: Diagnosis not present

## 2022-08-05 DIAGNOSIS — R051 Acute cough: Secondary | ICD-10-CM | POA: Diagnosis not present

## 2022-08-05 DIAGNOSIS — F339 Major depressive disorder, recurrent, unspecified: Secondary | ICD-10-CM | POA: Diagnosis not present

## 2022-08-05 DIAGNOSIS — I10 Essential (primary) hypertension: Secondary | ICD-10-CM | POA: Diagnosis not present

## 2022-08-05 DIAGNOSIS — M79605 Pain in left leg: Secondary | ICD-10-CM | POA: Diagnosis not present

## 2022-08-05 DIAGNOSIS — I4821 Permanent atrial fibrillation: Secondary | ICD-10-CM | POA: Diagnosis not present

## 2022-08-09 ENCOUNTER — Telehealth: Payer: Self-pay | Admitting: *Deleted

## 2022-08-09 DIAGNOSIS — R531 Weakness: Secondary | ICD-10-CM | POA: Diagnosis not present

## 2022-08-09 DIAGNOSIS — Z7901 Long term (current) use of anticoagulants: Secondary | ICD-10-CM | POA: Diagnosis not present

## 2022-08-09 DIAGNOSIS — R29898 Other symptoms and signs involving the musculoskeletal system: Secondary | ICD-10-CM | POA: Diagnosis not present

## 2022-08-09 DIAGNOSIS — I4821 Permanent atrial fibrillation: Secondary | ICD-10-CM | POA: Diagnosis not present

## 2022-08-09 NOTE — Telephone Encounter (Signed)
Called pt since she is overdue; there was no answer and no voicemail set up. Will try back at later date/time.

## 2022-08-12 DIAGNOSIS — I4821 Permanent atrial fibrillation: Secondary | ICD-10-CM | POA: Diagnosis not present

## 2022-08-12 DIAGNOSIS — Z7901 Long term (current) use of anticoagulants: Secondary | ICD-10-CM | POA: Diagnosis not present

## 2022-08-14 DIAGNOSIS — B372 Candidiasis of skin and nail: Secondary | ICD-10-CM | POA: Diagnosis not present

## 2022-08-18 DIAGNOSIS — F5105 Insomnia due to other mental disorder: Secondary | ICD-10-CM | POA: Diagnosis not present

## 2022-08-18 DIAGNOSIS — F411 Generalized anxiety disorder: Secondary | ICD-10-CM | POA: Diagnosis not present

## 2022-08-18 DIAGNOSIS — F339 Major depressive disorder, recurrent, unspecified: Secondary | ICD-10-CM | POA: Diagnosis not present

## 2022-08-19 DIAGNOSIS — F339 Major depressive disorder, recurrent, unspecified: Secondary | ICD-10-CM | POA: Diagnosis not present

## 2022-08-19 DIAGNOSIS — Z7901 Long term (current) use of anticoagulants: Secondary | ICD-10-CM | POA: Diagnosis not present

## 2022-08-19 DIAGNOSIS — E559 Vitamin D deficiency, unspecified: Secondary | ICD-10-CM | POA: Diagnosis not present

## 2022-08-19 DIAGNOSIS — D509 Iron deficiency anemia, unspecified: Secondary | ICD-10-CM | POA: Diagnosis not present

## 2022-08-19 DIAGNOSIS — I4821 Permanent atrial fibrillation: Secondary | ICD-10-CM | POA: Diagnosis not present

## 2022-08-19 DIAGNOSIS — E669 Obesity, unspecified: Secondary | ICD-10-CM | POA: Diagnosis not present

## 2022-08-22 DIAGNOSIS — I4821 Permanent atrial fibrillation: Secondary | ICD-10-CM | POA: Diagnosis not present

## 2022-08-22 DIAGNOSIS — I10 Essential (primary) hypertension: Secondary | ICD-10-CM | POA: Diagnosis not present

## 2022-08-22 DIAGNOSIS — F339 Major depressive disorder, recurrent, unspecified: Secondary | ICD-10-CM | POA: Diagnosis not present

## 2022-08-22 DIAGNOSIS — R531 Weakness: Secondary | ICD-10-CM | POA: Diagnosis not present

## 2022-08-22 DIAGNOSIS — I5022 Chronic systolic (congestive) heart failure: Secondary | ICD-10-CM | POA: Diagnosis not present

## 2022-08-24 DIAGNOSIS — Z6841 Body Mass Index (BMI) 40.0 and over, adult: Secondary | ICD-10-CM | POA: Diagnosis not present

## 2022-08-24 DIAGNOSIS — F32A Depression, unspecified: Secondary | ICD-10-CM | POA: Diagnosis not present

## 2022-08-24 DIAGNOSIS — M329 Systemic lupus erythematosus, unspecified: Secondary | ICD-10-CM | POA: Diagnosis not present

## 2022-08-24 DIAGNOSIS — M1990 Unspecified osteoarthritis, unspecified site: Secondary | ICD-10-CM | POA: Diagnosis not present

## 2022-08-24 DIAGNOSIS — S2241XD Multiple fractures of ribs, right side, subsequent encounter for fracture with routine healing: Secondary | ICD-10-CM | POA: Diagnosis not present

## 2022-08-24 DIAGNOSIS — I5040 Unspecified combined systolic (congestive) and diastolic (congestive) heart failure: Secondary | ICD-10-CM | POA: Diagnosis not present

## 2022-08-24 DIAGNOSIS — I110 Hypertensive heart disease with heart failure: Secondary | ICD-10-CM | POA: Diagnosis not present

## 2022-08-24 DIAGNOSIS — I050 Rheumatic mitral stenosis: Secondary | ICD-10-CM | POA: Diagnosis not present

## 2022-08-24 DIAGNOSIS — I428 Other cardiomyopathies: Secondary | ICD-10-CM | POA: Diagnosis not present

## 2022-08-24 DIAGNOSIS — Z86718 Personal history of other venous thrombosis and embolism: Secondary | ICD-10-CM | POA: Diagnosis not present

## 2022-08-24 DIAGNOSIS — Z7901 Long term (current) use of anticoagulants: Secondary | ICD-10-CM | POA: Diagnosis not present

## 2022-08-24 DIAGNOSIS — S72001D Fracture of unspecified part of neck of right femur, subsequent encounter for closed fracture with routine healing: Secondary | ICD-10-CM | POA: Diagnosis not present

## 2022-08-24 DIAGNOSIS — Z86711 Personal history of pulmonary embolism: Secondary | ICD-10-CM | POA: Diagnosis not present

## 2022-08-24 DIAGNOSIS — Z8673 Personal history of transient ischemic attack (TIA), and cerebral infarction without residual deficits: Secondary | ICD-10-CM | POA: Diagnosis not present

## 2022-08-24 DIAGNOSIS — B372 Candidiasis of skin and nail: Secondary | ICD-10-CM | POA: Diagnosis not present

## 2022-08-24 DIAGNOSIS — I4821 Permanent atrial fibrillation: Secondary | ICD-10-CM | POA: Diagnosis not present

## 2022-08-29 ENCOUNTER — Other Ambulatory Visit: Payer: Self-pay | Admitting: *Deleted

## 2022-08-29 DIAGNOSIS — I110 Hypertensive heart disease with heart failure: Secondary | ICD-10-CM | POA: Diagnosis not present

## 2022-08-29 DIAGNOSIS — S72001D Fracture of unspecified part of neck of right femur, subsequent encounter for closed fracture with routine healing: Secondary | ICD-10-CM | POA: Diagnosis not present

## 2022-08-29 DIAGNOSIS — I428 Other cardiomyopathies: Secondary | ICD-10-CM | POA: Diagnosis not present

## 2022-08-29 DIAGNOSIS — I4821 Permanent atrial fibrillation: Secondary | ICD-10-CM | POA: Diagnosis not present

## 2022-08-29 DIAGNOSIS — I5040 Unspecified combined systolic (congestive) and diastolic (congestive) heart failure: Secondary | ICD-10-CM | POA: Diagnosis not present

## 2022-08-29 DIAGNOSIS — S2241XD Multiple fractures of ribs, right side, subsequent encounter for fracture with routine healing: Secondary | ICD-10-CM | POA: Diagnosis not present

## 2022-08-29 NOTE — Patient Outreach (Signed)
Per [facility] Ms. Houp discharged from [facility] skilled nursing facility on 08/23/22. Screening for potential care coordination services as benefit of health plan and Primary Care Provider.  Per [facility] social worker, [facility] was arranged.   PCP office [facility] [place] has [facility] care management services available.   Secure communication sent to [facility] to make aware of skilled nursing discharge.   Raiford Noble, MSN, RN,BSN [facility] Post Acute Care Coordinator [phone] (Direct dial)

## 2022-08-30 DIAGNOSIS — I4821 Permanent atrial fibrillation: Secondary | ICD-10-CM | POA: Diagnosis not present

## 2022-08-30 DIAGNOSIS — I110 Hypertensive heart disease with heart failure: Secondary | ICD-10-CM | POA: Diagnosis not present

## 2022-08-30 DIAGNOSIS — I5040 Unspecified combined systolic (congestive) and diastolic (congestive) heart failure: Secondary | ICD-10-CM | POA: Diagnosis not present

## 2022-08-30 DIAGNOSIS — S72001D Fracture of unspecified part of neck of right femur, subsequent encounter for closed fracture with routine healing: Secondary | ICD-10-CM | POA: Diagnosis not present

## 2022-08-30 DIAGNOSIS — S2241XD Multiple fractures of ribs, right side, subsequent encounter for fracture with routine healing: Secondary | ICD-10-CM | POA: Diagnosis not present

## 2022-08-30 DIAGNOSIS — I428 Other cardiomyopathies: Secondary | ICD-10-CM | POA: Diagnosis not present

## 2022-09-02 DIAGNOSIS — I4821 Permanent atrial fibrillation: Secondary | ICD-10-CM | POA: Diagnosis not present

## 2022-09-02 DIAGNOSIS — I5040 Unspecified combined systolic (congestive) and diastolic (congestive) heart failure: Secondary | ICD-10-CM | POA: Diagnosis not present

## 2022-09-02 DIAGNOSIS — I428 Other cardiomyopathies: Secondary | ICD-10-CM | POA: Diagnosis not present

## 2022-09-02 DIAGNOSIS — I110 Hypertensive heart disease with heart failure: Secondary | ICD-10-CM | POA: Diagnosis not present

## 2022-09-02 DIAGNOSIS — S2241XD Multiple fractures of ribs, right side, subsequent encounter for fracture with routine healing: Secondary | ICD-10-CM | POA: Diagnosis not present

## 2022-09-02 DIAGNOSIS — S72001D Fracture of unspecified part of neck of right femur, subsequent encounter for closed fracture with routine healing: Secondary | ICD-10-CM | POA: Diagnosis not present

## 2022-09-06 DIAGNOSIS — S2241XD Multiple fractures of ribs, right side, subsequent encounter for fracture with routine healing: Secondary | ICD-10-CM | POA: Diagnosis not present

## 2022-09-06 DIAGNOSIS — S72001D Fracture of unspecified part of neck of right femur, subsequent encounter for closed fracture with routine healing: Secondary | ICD-10-CM | POA: Diagnosis not present

## 2022-09-06 DIAGNOSIS — I110 Hypertensive heart disease with heart failure: Secondary | ICD-10-CM | POA: Diagnosis not present

## 2022-09-06 DIAGNOSIS — I428 Other cardiomyopathies: Secondary | ICD-10-CM | POA: Diagnosis not present

## 2022-09-06 DIAGNOSIS — I4821 Permanent atrial fibrillation: Secondary | ICD-10-CM | POA: Diagnosis not present

## 2022-09-06 DIAGNOSIS — I5040 Unspecified combined systolic (congestive) and diastolic (congestive) heart failure: Secondary | ICD-10-CM | POA: Diagnosis not present

## 2022-09-07 DIAGNOSIS — I739 Peripheral vascular disease, unspecified: Secondary | ICD-10-CM | POA: Diagnosis not present

## 2022-09-07 DIAGNOSIS — I050 Rheumatic mitral stenosis: Secondary | ICD-10-CM | POA: Diagnosis not present

## 2022-09-07 DIAGNOSIS — D6862 Lupus anticoagulant syndrome: Secondary | ICD-10-CM | POA: Diagnosis not present

## 2022-09-07 DIAGNOSIS — Z79899 Other long term (current) drug therapy: Secondary | ICD-10-CM | POA: Diagnosis not present

## 2022-09-07 DIAGNOSIS — I4891 Unspecified atrial fibrillation: Secondary | ICD-10-CM | POA: Diagnosis not present

## 2022-09-07 DIAGNOSIS — D649 Anemia, unspecified: Secondary | ICD-10-CM | POA: Diagnosis not present

## 2022-09-07 DIAGNOSIS — I10 Essential (primary) hypertension: Secondary | ICD-10-CM | POA: Diagnosis not present

## 2022-09-07 DIAGNOSIS — D6859 Other primary thrombophilia: Secondary | ICD-10-CM | POA: Diagnosis not present

## 2022-09-07 DIAGNOSIS — E559 Vitamin D deficiency, unspecified: Secondary | ICD-10-CM | POA: Diagnosis not present

## 2022-09-07 DIAGNOSIS — I5022 Chronic systolic (congestive) heart failure: Secondary | ICD-10-CM | POA: Diagnosis not present

## 2022-09-07 DIAGNOSIS — E669 Obesity, unspecified: Secondary | ICD-10-CM | POA: Diagnosis not present

## 2022-09-07 DIAGNOSIS — R5381 Other malaise: Secondary | ICD-10-CM | POA: Diagnosis not present

## 2022-09-12 DIAGNOSIS — I5040 Unspecified combined systolic (congestive) and diastolic (congestive) heart failure: Secondary | ICD-10-CM | POA: Diagnosis not present

## 2022-09-12 DIAGNOSIS — S72001D Fracture of unspecified part of neck of right femur, subsequent encounter for closed fracture with routine healing: Secondary | ICD-10-CM | POA: Diagnosis not present

## 2022-09-12 DIAGNOSIS — I428 Other cardiomyopathies: Secondary | ICD-10-CM | POA: Diagnosis not present

## 2022-09-12 DIAGNOSIS — I4821 Permanent atrial fibrillation: Secondary | ICD-10-CM | POA: Diagnosis not present

## 2022-09-12 DIAGNOSIS — I110 Hypertensive heart disease with heart failure: Secondary | ICD-10-CM | POA: Diagnosis not present

## 2022-09-12 DIAGNOSIS — S2241XD Multiple fractures of ribs, right side, subsequent encounter for fracture with routine healing: Secondary | ICD-10-CM | POA: Diagnosis not present

## 2022-09-20 DIAGNOSIS — S72001D Fracture of unspecified part of neck of right femur, subsequent encounter for closed fracture with routine healing: Secondary | ICD-10-CM | POA: Diagnosis not present

## 2022-09-20 DIAGNOSIS — I428 Other cardiomyopathies: Secondary | ICD-10-CM | POA: Diagnosis not present

## 2022-09-20 DIAGNOSIS — I110 Hypertensive heart disease with heart failure: Secondary | ICD-10-CM | POA: Diagnosis not present

## 2022-09-20 DIAGNOSIS — I4821 Permanent atrial fibrillation: Secondary | ICD-10-CM | POA: Diagnosis not present

## 2022-09-20 DIAGNOSIS — I5040 Unspecified combined systolic (congestive) and diastolic (congestive) heart failure: Secondary | ICD-10-CM | POA: Diagnosis not present

## 2022-09-20 DIAGNOSIS — S2241XD Multiple fractures of ribs, right side, subsequent encounter for fracture with routine healing: Secondary | ICD-10-CM | POA: Diagnosis not present

## 2022-09-23 DIAGNOSIS — Z86718 Personal history of other venous thrombosis and embolism: Secondary | ICD-10-CM | POA: Diagnosis not present

## 2022-09-23 DIAGNOSIS — S2241XD Multiple fractures of ribs, right side, subsequent encounter for fracture with routine healing: Secondary | ICD-10-CM | POA: Diagnosis not present

## 2022-09-23 DIAGNOSIS — F32A Depression, unspecified: Secondary | ICD-10-CM | POA: Diagnosis not present

## 2022-09-23 DIAGNOSIS — Z8673 Personal history of transient ischemic attack (TIA), and cerebral infarction without residual deficits: Secondary | ICD-10-CM | POA: Diagnosis not present

## 2022-09-23 DIAGNOSIS — I050 Rheumatic mitral stenosis: Secondary | ICD-10-CM | POA: Diagnosis not present

## 2022-09-23 DIAGNOSIS — Z6841 Body Mass Index (BMI) 40.0 and over, adult: Secondary | ICD-10-CM | POA: Diagnosis not present

## 2022-09-23 DIAGNOSIS — I110 Hypertensive heart disease with heart failure: Secondary | ICD-10-CM | POA: Diagnosis not present

## 2022-09-23 DIAGNOSIS — M1990 Unspecified osteoarthritis, unspecified site: Secondary | ICD-10-CM | POA: Diagnosis not present

## 2022-09-23 DIAGNOSIS — M329 Systemic lupus erythematosus, unspecified: Secondary | ICD-10-CM | POA: Diagnosis not present

## 2022-09-23 DIAGNOSIS — S72001D Fracture of unspecified part of neck of right femur, subsequent encounter for closed fracture with routine healing: Secondary | ICD-10-CM | POA: Diagnosis not present

## 2022-09-23 DIAGNOSIS — I428 Other cardiomyopathies: Secondary | ICD-10-CM | POA: Diagnosis not present

## 2022-09-23 DIAGNOSIS — Z86711 Personal history of pulmonary embolism: Secondary | ICD-10-CM | POA: Diagnosis not present

## 2022-09-23 DIAGNOSIS — I4821 Permanent atrial fibrillation: Secondary | ICD-10-CM | POA: Diagnosis not present

## 2022-09-23 DIAGNOSIS — B372 Candidiasis of skin and nail: Secondary | ICD-10-CM | POA: Diagnosis not present

## 2022-09-23 DIAGNOSIS — Z7901 Long term (current) use of anticoagulants: Secondary | ICD-10-CM | POA: Diagnosis not present

## 2022-09-23 DIAGNOSIS — I5040 Unspecified combined systolic (congestive) and diastolic (congestive) heart failure: Secondary | ICD-10-CM | POA: Diagnosis not present

## 2022-09-26 ENCOUNTER — Other Ambulatory Visit: Payer: Self-pay | Admitting: Cardiology

## 2022-09-26 DIAGNOSIS — S72001D Fracture of unspecified part of neck of right femur, subsequent encounter for closed fracture with routine healing: Secondary | ICD-10-CM | POA: Diagnosis not present

## 2022-09-26 DIAGNOSIS — I428 Other cardiomyopathies: Secondary | ICD-10-CM | POA: Diagnosis not present

## 2022-09-26 DIAGNOSIS — I4821 Permanent atrial fibrillation: Secondary | ICD-10-CM | POA: Diagnosis not present

## 2022-09-26 DIAGNOSIS — I5040 Unspecified combined systolic (congestive) and diastolic (congestive) heart failure: Secondary | ICD-10-CM | POA: Diagnosis not present

## 2022-09-26 DIAGNOSIS — S2241XD Multiple fractures of ribs, right side, subsequent encounter for fracture with routine healing: Secondary | ICD-10-CM | POA: Diagnosis not present

## 2022-09-26 DIAGNOSIS — I110 Hypertensive heart disease with heart failure: Secondary | ICD-10-CM | POA: Diagnosis not present

## 2022-09-26 NOTE — Telephone Encounter (Signed)
Patient has requested refill for Metoprolol Tartrate 25 MG. Last office was 02/01/22. I can't open the encounter, it says the encounter was inferred from claims data. Patient hasn't made any of the last 7 office visits and doesn't have an upcoming visit either.

## 2022-09-30 DIAGNOSIS — S2241XD Multiple fractures of ribs, right side, subsequent encounter for fracture with routine healing: Secondary | ICD-10-CM | POA: Diagnosis not present

## 2022-09-30 DIAGNOSIS — I5040 Unspecified combined systolic (congestive) and diastolic (congestive) heart failure: Secondary | ICD-10-CM | POA: Diagnosis not present

## 2022-09-30 DIAGNOSIS — I4821 Permanent atrial fibrillation: Secondary | ICD-10-CM | POA: Diagnosis not present

## 2022-09-30 DIAGNOSIS — I428 Other cardiomyopathies: Secondary | ICD-10-CM | POA: Diagnosis not present

## 2022-09-30 DIAGNOSIS — I110 Hypertensive heart disease with heart failure: Secondary | ICD-10-CM | POA: Diagnosis not present

## 2022-09-30 DIAGNOSIS — S72001D Fracture of unspecified part of neck of right femur, subsequent encounter for closed fracture with routine healing: Secondary | ICD-10-CM | POA: Diagnosis not present

## 2022-10-03 DIAGNOSIS — I5040 Unspecified combined systolic (congestive) and diastolic (congestive) heart failure: Secondary | ICD-10-CM | POA: Diagnosis not present

## 2022-10-03 DIAGNOSIS — I110 Hypertensive heart disease with heart failure: Secondary | ICD-10-CM | POA: Diagnosis not present

## 2022-10-03 DIAGNOSIS — S72001D Fracture of unspecified part of neck of right femur, subsequent encounter for closed fracture with routine healing: Secondary | ICD-10-CM | POA: Diagnosis not present

## 2022-10-03 DIAGNOSIS — I4821 Permanent atrial fibrillation: Secondary | ICD-10-CM | POA: Diagnosis not present

## 2022-10-03 DIAGNOSIS — S2241XD Multiple fractures of ribs, right side, subsequent encounter for fracture with routine healing: Secondary | ICD-10-CM | POA: Diagnosis not present

## 2022-10-03 DIAGNOSIS — I428 Other cardiomyopathies: Secondary | ICD-10-CM | POA: Diagnosis not present

## 2022-10-05 DIAGNOSIS — I4821 Permanent atrial fibrillation: Secondary | ICD-10-CM | POA: Diagnosis not present

## 2022-10-05 DIAGNOSIS — I110 Hypertensive heart disease with heart failure: Secondary | ICD-10-CM | POA: Diagnosis not present

## 2022-10-05 DIAGNOSIS — I5040 Unspecified combined systolic (congestive) and diastolic (congestive) heart failure: Secondary | ICD-10-CM | POA: Diagnosis not present

## 2022-10-05 DIAGNOSIS — S2241XD Multiple fractures of ribs, right side, subsequent encounter for fracture with routine healing: Secondary | ICD-10-CM | POA: Diagnosis not present

## 2022-10-05 DIAGNOSIS — S72001D Fracture of unspecified part of neck of right femur, subsequent encounter for closed fracture with routine healing: Secondary | ICD-10-CM | POA: Diagnosis not present

## 2022-10-05 DIAGNOSIS — I428 Other cardiomyopathies: Secondary | ICD-10-CM | POA: Diagnosis not present

## 2022-10-10 DIAGNOSIS — I5040 Unspecified combined systolic (congestive) and diastolic (congestive) heart failure: Secondary | ICD-10-CM | POA: Diagnosis not present

## 2022-10-10 DIAGNOSIS — I4821 Permanent atrial fibrillation: Secondary | ICD-10-CM | POA: Diagnosis not present

## 2022-10-10 DIAGNOSIS — S72001D Fracture of unspecified part of neck of right femur, subsequent encounter for closed fracture with routine healing: Secondary | ICD-10-CM | POA: Diagnosis not present

## 2022-10-10 DIAGNOSIS — I110 Hypertensive heart disease with heart failure: Secondary | ICD-10-CM | POA: Diagnosis not present

## 2022-10-10 DIAGNOSIS — I428 Other cardiomyopathies: Secondary | ICD-10-CM | POA: Diagnosis not present

## 2022-10-10 DIAGNOSIS — S2241XD Multiple fractures of ribs, right side, subsequent encounter for fracture with routine healing: Secondary | ICD-10-CM | POA: Diagnosis not present

## 2022-10-12 DIAGNOSIS — I428 Other cardiomyopathies: Secondary | ICD-10-CM | POA: Diagnosis not present

## 2022-10-12 DIAGNOSIS — S2241XD Multiple fractures of ribs, right side, subsequent encounter for fracture with routine healing: Secondary | ICD-10-CM | POA: Diagnosis not present

## 2022-10-12 DIAGNOSIS — I4821 Permanent atrial fibrillation: Secondary | ICD-10-CM | POA: Diagnosis not present

## 2022-10-12 DIAGNOSIS — I5040 Unspecified combined systolic (congestive) and diastolic (congestive) heart failure: Secondary | ICD-10-CM | POA: Diagnosis not present

## 2022-10-12 DIAGNOSIS — I110 Hypertensive heart disease with heart failure: Secondary | ICD-10-CM | POA: Diagnosis not present

## 2022-10-12 DIAGNOSIS — S72001D Fracture of unspecified part of neck of right femur, subsequent encounter for closed fracture with routine healing: Secondary | ICD-10-CM | POA: Diagnosis not present

## 2022-10-19 DIAGNOSIS — I428 Other cardiomyopathies: Secondary | ICD-10-CM | POA: Diagnosis not present

## 2022-10-19 DIAGNOSIS — I5040 Unspecified combined systolic (congestive) and diastolic (congestive) heart failure: Secondary | ICD-10-CM | POA: Diagnosis not present

## 2022-10-19 DIAGNOSIS — I110 Hypertensive heart disease with heart failure: Secondary | ICD-10-CM | POA: Diagnosis not present

## 2022-10-19 DIAGNOSIS — I4821 Permanent atrial fibrillation: Secondary | ICD-10-CM | POA: Diagnosis not present

## 2022-10-19 DIAGNOSIS — S72001D Fracture of unspecified part of neck of right femur, subsequent encounter for closed fracture with routine healing: Secondary | ICD-10-CM | POA: Diagnosis not present

## 2022-10-19 DIAGNOSIS — S2241XD Multiple fractures of ribs, right side, subsequent encounter for fracture with routine healing: Secondary | ICD-10-CM | POA: Diagnosis not present

## 2022-10-23 DIAGNOSIS — I509 Heart failure, unspecified: Secondary | ICD-10-CM | POA: Diagnosis not present

## 2022-10-23 DIAGNOSIS — I110 Hypertensive heart disease with heart failure: Secondary | ICD-10-CM | POA: Diagnosis not present

## 2022-10-23 DIAGNOSIS — I5040 Unspecified combined systolic (congestive) and diastolic (congestive) heart failure: Secondary | ICD-10-CM | POA: Diagnosis not present

## 2022-10-23 DIAGNOSIS — Z86718 Personal history of other venous thrombosis and embolism: Secondary | ICD-10-CM | POA: Diagnosis not present

## 2022-10-23 DIAGNOSIS — Z6841 Body Mass Index (BMI) 40.0 and over, adult: Secondary | ICD-10-CM | POA: Diagnosis not present

## 2022-10-23 DIAGNOSIS — Z8673 Personal history of transient ischemic attack (TIA), and cerebral infarction without residual deficits: Secondary | ICD-10-CM | POA: Diagnosis not present

## 2022-10-23 DIAGNOSIS — S2241XD Multiple fractures of ribs, right side, subsequent encounter for fracture with routine healing: Secondary | ICD-10-CM | POA: Diagnosis not present

## 2022-10-23 DIAGNOSIS — I4821 Permanent atrial fibrillation: Secondary | ICD-10-CM | POA: Diagnosis not present

## 2022-10-23 DIAGNOSIS — M329 Systemic lupus erythematosus, unspecified: Secondary | ICD-10-CM | POA: Diagnosis not present

## 2022-10-23 DIAGNOSIS — Z7901 Long term (current) use of anticoagulants: Secondary | ICD-10-CM | POA: Diagnosis not present

## 2022-10-23 DIAGNOSIS — I428 Other cardiomyopathies: Secondary | ICD-10-CM | POA: Diagnosis not present

## 2022-10-23 DIAGNOSIS — F32A Depression, unspecified: Secondary | ICD-10-CM | POA: Diagnosis not present

## 2022-10-23 DIAGNOSIS — B372 Candidiasis of skin and nail: Secondary | ICD-10-CM | POA: Diagnosis not present

## 2022-10-23 DIAGNOSIS — M1990 Unspecified osteoarthritis, unspecified site: Secondary | ICD-10-CM | POA: Diagnosis not present

## 2022-10-23 DIAGNOSIS — S72001D Fracture of unspecified part of neck of right femur, subsequent encounter for closed fracture with routine healing: Secondary | ICD-10-CM | POA: Diagnosis not present

## 2022-10-23 DIAGNOSIS — Z86711 Personal history of pulmonary embolism: Secondary | ICD-10-CM | POA: Diagnosis not present

## 2022-10-23 DIAGNOSIS — I050 Rheumatic mitral stenosis: Secondary | ICD-10-CM | POA: Diagnosis not present

## 2022-10-24 DIAGNOSIS — I428 Other cardiomyopathies: Secondary | ICD-10-CM | POA: Diagnosis not present

## 2022-10-24 DIAGNOSIS — S72001D Fracture of unspecified part of neck of right femur, subsequent encounter for closed fracture with routine healing: Secondary | ICD-10-CM | POA: Diagnosis not present

## 2022-10-24 DIAGNOSIS — I5040 Unspecified combined systolic (congestive) and diastolic (congestive) heart failure: Secondary | ICD-10-CM | POA: Diagnosis not present

## 2022-10-24 DIAGNOSIS — I110 Hypertensive heart disease with heart failure: Secondary | ICD-10-CM | POA: Diagnosis not present

## 2022-10-24 DIAGNOSIS — I4821 Permanent atrial fibrillation: Secondary | ICD-10-CM | POA: Diagnosis not present

## 2022-10-24 DIAGNOSIS — S2241XD Multiple fractures of ribs, right side, subsequent encounter for fracture with routine healing: Secondary | ICD-10-CM | POA: Diagnosis not present

## 2022-10-27 ENCOUNTER — Other Ambulatory Visit: Payer: Self-pay | Admitting: Cardiology

## 2022-10-31 ENCOUNTER — Telehealth: Payer: Self-pay | Admitting: Cardiology

## 2022-10-31 DIAGNOSIS — S72001D Fracture of unspecified part of neck of right femur, subsequent encounter for closed fracture with routine healing: Secondary | ICD-10-CM | POA: Diagnosis not present

## 2022-10-31 DIAGNOSIS — S2241XD Multiple fractures of ribs, right side, subsequent encounter for fracture with routine healing: Secondary | ICD-10-CM | POA: Diagnosis not present

## 2022-10-31 DIAGNOSIS — I4821 Permanent atrial fibrillation: Secondary | ICD-10-CM | POA: Diagnosis not present

## 2022-10-31 DIAGNOSIS — I110 Hypertensive heart disease with heart failure: Secondary | ICD-10-CM | POA: Diagnosis not present

## 2022-10-31 DIAGNOSIS — I5040 Unspecified combined systolic (congestive) and diastolic (congestive) heart failure: Secondary | ICD-10-CM | POA: Diagnosis not present

## 2022-10-31 DIAGNOSIS — I428 Other cardiomyopathies: Secondary | ICD-10-CM | POA: Diagnosis not present

## 2022-10-31 MED ORDER — METOPROLOL TARTRATE 25 MG PO TABS: 25.0000 mg | ORAL_TABLET | Freq: Two times a day (BID) | ORAL | 0 refills | Status: DC

## 2022-10-31 NOTE — Telephone Encounter (Signed)
*  STAT* If patient is at the pharmacy, call can be transferred to refill team.   1. Which medications need to be refilled? (please list name of each medication and dose if known)   metoprolol tartrate (LOPRESSOR) 25 MG tablet    2. Which pharmacy/location (including street and city if local pharmacy) is medication to be sent to?  [facility] -  ([place]), [place] - [address]      3. Do they need a 30 day or 90 day supply? 90 day   Pt is completely out of medication and is scheduled to come into office on 10/30.

## 2022-10-31 NOTE — Telephone Encounter (Signed)
Pt's medication was sent to pt's pharmacy as requested. Confirmation received.  °

## 2022-11-07 DIAGNOSIS — I4821 Permanent atrial fibrillation: Secondary | ICD-10-CM | POA: Diagnosis not present

## 2022-11-07 DIAGNOSIS — I110 Hypertensive heart disease with heart failure: Secondary | ICD-10-CM | POA: Diagnosis not present

## 2022-11-07 DIAGNOSIS — I5040 Unspecified combined systolic (congestive) and diastolic (congestive) heart failure: Secondary | ICD-10-CM | POA: Diagnosis not present

## 2022-11-07 DIAGNOSIS — I428 Other cardiomyopathies: Secondary | ICD-10-CM | POA: Diagnosis not present

## 2022-11-07 DIAGNOSIS — S72001D Fracture of unspecified part of neck of right femur, subsequent encounter for closed fracture with routine healing: Secondary | ICD-10-CM | POA: Diagnosis not present

## 2022-11-07 DIAGNOSIS — S2241XD Multiple fractures of ribs, right side, subsequent encounter for fracture with routine healing: Secondary | ICD-10-CM | POA: Diagnosis not present

## 2022-11-08 DIAGNOSIS — I428 Other cardiomyopathies: Secondary | ICD-10-CM | POA: Diagnosis not present

## 2022-11-08 DIAGNOSIS — S72001D Fracture of unspecified part of neck of right femur, subsequent encounter for closed fracture with routine healing: Secondary | ICD-10-CM | POA: Diagnosis not present

## 2022-11-08 DIAGNOSIS — I4821 Permanent atrial fibrillation: Secondary | ICD-10-CM | POA: Diagnosis not present

## 2022-11-08 DIAGNOSIS — I110 Hypertensive heart disease with heart failure: Secondary | ICD-10-CM | POA: Diagnosis not present

## 2022-11-08 DIAGNOSIS — S2241XD Multiple fractures of ribs, right side, subsequent encounter for fracture with routine healing: Secondary | ICD-10-CM | POA: Diagnosis not present

## 2022-11-08 DIAGNOSIS — I5040 Unspecified combined systolic (congestive) and diastolic (congestive) heart failure: Secondary | ICD-10-CM | POA: Diagnosis not present

## 2022-11-14 DIAGNOSIS — I110 Hypertensive heart disease with heart failure: Secondary | ICD-10-CM | POA: Diagnosis not present

## 2022-11-14 DIAGNOSIS — S72001D Fracture of unspecified part of neck of right femur, subsequent encounter for closed fracture with routine healing: Secondary | ICD-10-CM | POA: Diagnosis not present

## 2022-11-14 DIAGNOSIS — I428 Other cardiomyopathies: Secondary | ICD-10-CM | POA: Diagnosis not present

## 2022-11-14 DIAGNOSIS — S2241XD Multiple fractures of ribs, right side, subsequent encounter for fracture with routine healing: Secondary | ICD-10-CM | POA: Diagnosis not present

## 2022-11-14 DIAGNOSIS — I4821 Permanent atrial fibrillation: Secondary | ICD-10-CM | POA: Diagnosis not present

## 2022-11-14 DIAGNOSIS — I5040 Unspecified combined systolic (congestive) and diastolic (congestive) heart failure: Secondary | ICD-10-CM | POA: Diagnosis not present

## 2022-11-15 DIAGNOSIS — S72001D Fracture of unspecified part of neck of right femur, subsequent encounter for closed fracture with routine healing: Secondary | ICD-10-CM | POA: Diagnosis not present

## 2022-11-15 DIAGNOSIS — I110 Hypertensive heart disease with heart failure: Secondary | ICD-10-CM | POA: Diagnosis not present

## 2022-11-15 DIAGNOSIS — I428 Other cardiomyopathies: Secondary | ICD-10-CM | POA: Diagnosis not present

## 2022-11-15 DIAGNOSIS — I4821 Permanent atrial fibrillation: Secondary | ICD-10-CM | POA: Diagnosis not present

## 2022-11-15 DIAGNOSIS — S2241XD Multiple fractures of ribs, right side, subsequent encounter for fracture with routine healing: Secondary | ICD-10-CM | POA: Diagnosis not present

## 2022-11-15 DIAGNOSIS — I5040 Unspecified combined systolic (congestive) and diastolic (congestive) heart failure: Secondary | ICD-10-CM | POA: Diagnosis not present

## 2022-11-22 DIAGNOSIS — Z6841 Body Mass Index (BMI) 40.0 and over, adult: Secondary | ICD-10-CM | POA: Diagnosis not present

## 2022-11-22 DIAGNOSIS — Z7901 Long term (current) use of anticoagulants: Secondary | ICD-10-CM | POA: Diagnosis not present

## 2022-11-22 DIAGNOSIS — I428 Other cardiomyopathies: Secondary | ICD-10-CM | POA: Diagnosis not present

## 2022-11-22 DIAGNOSIS — S72001D Fracture of unspecified part of neck of right femur, subsequent encounter for closed fracture with routine healing: Secondary | ICD-10-CM | POA: Diagnosis not present

## 2022-11-22 DIAGNOSIS — I5040 Unspecified combined systolic (congestive) and diastolic (congestive) heart failure: Secondary | ICD-10-CM | POA: Diagnosis not present

## 2022-11-22 DIAGNOSIS — M329 Systemic lupus erythematosus, unspecified: Secondary | ICD-10-CM | POA: Diagnosis not present

## 2022-11-22 DIAGNOSIS — F32A Depression, unspecified: Secondary | ICD-10-CM | POA: Diagnosis not present

## 2022-11-22 DIAGNOSIS — Z86718 Personal history of other venous thrombosis and embolism: Secondary | ICD-10-CM | POA: Diagnosis not present

## 2022-11-22 DIAGNOSIS — I050 Rheumatic mitral stenosis: Secondary | ICD-10-CM | POA: Diagnosis not present

## 2022-11-22 DIAGNOSIS — I509 Heart failure, unspecified: Secondary | ICD-10-CM | POA: Diagnosis not present

## 2022-11-22 DIAGNOSIS — I110 Hypertensive heart disease with heart failure: Secondary | ICD-10-CM | POA: Diagnosis not present

## 2022-11-22 DIAGNOSIS — Z86711 Personal history of pulmonary embolism: Secondary | ICD-10-CM | POA: Diagnosis not present

## 2022-11-22 DIAGNOSIS — I4821 Permanent atrial fibrillation: Secondary | ICD-10-CM | POA: Diagnosis not present

## 2022-11-22 DIAGNOSIS — Z8673 Personal history of transient ischemic attack (TIA), and cerebral infarction without residual deficits: Secondary | ICD-10-CM | POA: Diagnosis not present

## 2022-11-22 DIAGNOSIS — S2241XD Multiple fractures of ribs, right side, subsequent encounter for fracture with routine healing: Secondary | ICD-10-CM | POA: Diagnosis not present

## 2022-11-22 DIAGNOSIS — B372 Candidiasis of skin and nail: Secondary | ICD-10-CM | POA: Diagnosis not present

## 2022-11-22 DIAGNOSIS — M1990 Unspecified osteoarthritis, unspecified site: Secondary | ICD-10-CM | POA: Diagnosis not present

## 2022-11-23 DIAGNOSIS — I5040 Unspecified combined systolic (congestive) and diastolic (congestive) heart failure: Secondary | ICD-10-CM | POA: Diagnosis not present

## 2022-11-23 DIAGNOSIS — S72001D Fracture of unspecified part of neck of right femur, subsequent encounter for closed fracture with routine healing: Secondary | ICD-10-CM | POA: Diagnosis not present

## 2022-11-23 DIAGNOSIS — I110 Hypertensive heart disease with heart failure: Secondary | ICD-10-CM | POA: Diagnosis not present

## 2022-11-23 DIAGNOSIS — I4821 Permanent atrial fibrillation: Secondary | ICD-10-CM | POA: Diagnosis not present

## 2022-11-23 DIAGNOSIS — S2241XD Multiple fractures of ribs, right side, subsequent encounter for fracture with routine healing: Secondary | ICD-10-CM | POA: Diagnosis not present

## 2022-11-23 DIAGNOSIS — I428 Other cardiomyopathies: Secondary | ICD-10-CM | POA: Diagnosis not present

## 2022-11-30 DIAGNOSIS — I428 Other cardiomyopathies: Secondary | ICD-10-CM | POA: Diagnosis not present

## 2022-11-30 DIAGNOSIS — S72001D Fracture of unspecified part of neck of right femur, subsequent encounter for closed fracture with routine healing: Secondary | ICD-10-CM | POA: Diagnosis not present

## 2022-11-30 DIAGNOSIS — I4821 Permanent atrial fibrillation: Secondary | ICD-10-CM | POA: Diagnosis not present

## 2022-11-30 DIAGNOSIS — I110 Hypertensive heart disease with heart failure: Secondary | ICD-10-CM | POA: Diagnosis not present

## 2022-11-30 DIAGNOSIS — I5040 Unspecified combined systolic (congestive) and diastolic (congestive) heart failure: Secondary | ICD-10-CM | POA: Diagnosis not present

## 2022-11-30 DIAGNOSIS — S2241XD Multiple fractures of ribs, right side, subsequent encounter for fracture with routine healing: Secondary | ICD-10-CM | POA: Diagnosis not present

## 2022-12-06 DIAGNOSIS — I428 Other cardiomyopathies: Secondary | ICD-10-CM | POA: Diagnosis not present

## 2022-12-06 DIAGNOSIS — I110 Hypertensive heart disease with heart failure: Secondary | ICD-10-CM | POA: Diagnosis not present

## 2022-12-06 DIAGNOSIS — S72001D Fracture of unspecified part of neck of right femur, subsequent encounter for closed fracture with routine healing: Secondary | ICD-10-CM | POA: Diagnosis not present

## 2022-12-06 DIAGNOSIS — S2241XD Multiple fractures of ribs, right side, subsequent encounter for fracture with routine healing: Secondary | ICD-10-CM | POA: Diagnosis not present

## 2022-12-06 DIAGNOSIS — I4821 Permanent atrial fibrillation: Secondary | ICD-10-CM | POA: Diagnosis not present

## 2022-12-06 DIAGNOSIS — I5040 Unspecified combined systolic (congestive) and diastolic (congestive) heart failure: Secondary | ICD-10-CM | POA: Diagnosis not present

## 2022-12-13 DIAGNOSIS — I110 Hypertensive heart disease with heart failure: Secondary | ICD-10-CM | POA: Diagnosis not present

## 2022-12-13 DIAGNOSIS — I4821 Permanent atrial fibrillation: Secondary | ICD-10-CM | POA: Diagnosis not present

## 2022-12-13 DIAGNOSIS — S2241XD Multiple fractures of ribs, right side, subsequent encounter for fracture with routine healing: Secondary | ICD-10-CM | POA: Diagnosis not present

## 2022-12-13 DIAGNOSIS — I428 Other cardiomyopathies: Secondary | ICD-10-CM | POA: Diagnosis not present

## 2022-12-13 DIAGNOSIS — I5040 Unspecified combined systolic (congestive) and diastolic (congestive) heart failure: Secondary | ICD-10-CM | POA: Diagnosis not present

## 2022-12-13 DIAGNOSIS — S72001D Fracture of unspecified part of neck of right femur, subsequent encounter for closed fracture with routine healing: Secondary | ICD-10-CM | POA: Diagnosis not present

## 2022-12-21 DIAGNOSIS — I4821 Permanent atrial fibrillation: Secondary | ICD-10-CM | POA: Diagnosis not present

## 2022-12-21 DIAGNOSIS — S72001D Fracture of unspecified part of neck of right femur, subsequent encounter for closed fracture with routine healing: Secondary | ICD-10-CM | POA: Diagnosis not present

## 2022-12-21 DIAGNOSIS — S2241XD Multiple fractures of ribs, right side, subsequent encounter for fracture with routine healing: Secondary | ICD-10-CM | POA: Diagnosis not present

## 2022-12-21 DIAGNOSIS — I110 Hypertensive heart disease with heart failure: Secondary | ICD-10-CM | POA: Diagnosis not present

## 2022-12-21 DIAGNOSIS — I5040 Unspecified combined systolic (congestive) and diastolic (congestive) heart failure: Secondary | ICD-10-CM | POA: Diagnosis not present

## 2022-12-21 DIAGNOSIS — I428 Other cardiomyopathies: Secondary | ICD-10-CM | POA: Diagnosis not present

## 2023-01-16 DIAGNOSIS — Z23 Encounter for immunization: Secondary | ICD-10-CM | POA: Diagnosis not present

## 2023-01-16 DIAGNOSIS — Z7901 Long term (current) use of anticoagulants: Secondary | ICD-10-CM | POA: Diagnosis not present
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
## 2023-01-25 ENCOUNTER — Ambulatory Visit: Payer: No Typology Code available for payment source | Attending: Cardiology | Admitting: Cardiology

## 2023-01-25 DIAGNOSIS — I4821 Permanent atrial fibrillation: Secondary | ICD-10-CM

## 2023-01-25 DIAGNOSIS — R58 Hemorrhage, not elsewhere classified: Secondary | ICD-10-CM

## 2023-01-25 DIAGNOSIS — I5022 Chronic systolic (congestive) heart failure: Secondary | ICD-10-CM

## 2023-02-03 ENCOUNTER — Other Ambulatory Visit (HOSPITAL_COMMUNITY): Payer: Self-pay

## 2023-02-03 ENCOUNTER — Telehealth: Payer: Self-pay | Admitting: *Deleted

## 2023-02-03 ENCOUNTER — Other Ambulatory Visit: Payer: Self-pay | Admitting: Cardiology

## 2023-02-03 ENCOUNTER — Ambulatory Visit: Payer: No Typology Code available for payment source

## 2023-02-03 DIAGNOSIS — Z7901 Long term (current) use of anticoagulants: Secondary | ICD-10-CM | POA: Diagnosis not present

## 2023-02-03 MED ORDER — METOPROLOL TARTRATE 25 MG PO TABS: 25.0000 mg | ORAL_TABLET | Freq: Two times a day (BID) | ORAL | 1 refills | Status: DC

## 2023-02-03 MED ORDER — METOPROLOL TARTRATE 25 MG PO TABS
25.0000 mg | ORAL_TABLET | Freq: Two times a day (BID) | ORAL | 2 refills | Status: AC
  Filled 2023-02-03: qty 60, 30d supply, fill #0

## 2023-02-03 NOTE — Telephone Encounter (Signed)
*  STAT* If patient is at the pharmacy, call can be transferred to refill team.   1. Which medications need to be refilled? (please list name of each medication and dose if known)   metoprolol tartrate (LOPRESSOR) 25 MG tablet   2. Would you like to learn more about the convenience, safety, & potential cost savings by using the [facility]?   3. Are you open to using the [facility] (Type [facility]. ).  4. Which pharmacy/location (including street and city if local pharmacy) is medication to be sent to?  [facility] - [place], [place] - [address]   5. Do they need a 30 day or 90 day supply?   90 day   Patient stated she is completely out of this medication.

## 2023-02-03 NOTE — Telephone Encounter (Signed)
She has a follow up appointment. Refill hjas been done.

## 2023-02-03 NOTE — Telephone Encounter (Signed)
This Pt of Dr. Antoine Poche is passed her 3rd attempt. Does Dr. Antoine Poche want to reorder? Please advise.

## 2023-02-03 NOTE — Telephone Encounter (Signed)
Called pt since she is pending and Anticoagulation Appt with our clinic at 145pm today. Spoke with pt she states she goes to Dr. Orson Aloe with [facility] for warfarin management since it is easier for her. She states she was needing a refill on metoprolol and advised she needs an appt with Dr. Jens Som. She states goes to Dr. Orson Aloe office today at 415p for INR, she is aware in order to get med refills she needs an appt, I transferred her to scheduling dept to get scheduled with Dr. Jens Som, she stated she understood and will do. Canceled Anticoagulation Appt for today and pt has been scheduled for Dr. Jens Som on 04/20/23 per Schedulers.

## 2023-02-03 NOTE — Telephone Encounter (Signed)
*  STAT* If patient is at the pharmacy, call can be transferred to refill team.   1. Which medications need to be refilled? (please list name of each medication and dose if known) metoprolol tartrate (LOPRESSOR) 25 MG tablet   2. Which pharmacy/location (including street and city if local pharmacy) is medication to be sent to? [facility] - [place], [place] - [address]   3. Do they need a 30 day or 90 day supply? 30

## 2023-02-03 NOTE — Telephone Encounter (Signed)
Pt has not been seen since 2022, pt has another upcoming appt with Dr. Antoine Poche. Pt has cancelled appts in the past. Would Dr. Antoine Poche like to refill this medication until appt time or does pt need to be seen sooner? Please address

## 2023-04-19 NOTE — Progress Notes (Deleted)
  Cardiology Office Note:   Date:  04/19/2023  ID:  [name], DOB 07-Dec-1945, MRN [id number] PCP: Marden Noble, MD (Inactive)  [facility] Providers Cardiologist:  Rollene Rotunda, MD {  History of Present Illness:   [name] is a 78 y.o. female  who presents for management of atrial fibrillation, with other history to include DVTs and bilateral PEs, with an IVC filter.  She has also had bilateral femoral artery embolectomy on 06/2008, history of embolic CVA in 2012 requiring TPA with subsequent hemorrhage conversion.  She also was found to have a right lower extremity popliteal embolism in the setting of subtherapeutic INR of 1.43 requiring surgical intervention.   Post surgery she developed a wound infection requiring surgery again on 03/05/2017 and a wound VAC was placed.   The patient also has a history of lupus anticoagulation disorder, obesity, nonischemic cardiomyopathy with EF of 40% to 45%.  (More recent EF 55 - 60% 6/22)  It has been a couple of years since I last saw her.  ***  ***    Since that hospitalization she has done okay.  She gets around with a walker if she goes long distances.  She uses her cane when she is in the house.  She moves slowly.  The patient denies any new symptoms such as chest discomfort, neck or arm discomfort. There has been no new shortness of breath, PND or orthopnea. There have been no reported palpitations, presyncope or syncope.   She really does not feel her atrial fibrillation.  She tolerates her anticoagulation and has had no bleeding.    ROS: ***  Studies Reviewed:    EKG:       ***  Risk Assessment/Calculations:   {Does this patient have ATRIAL FIBRILLATION?:[phone]} No BP recorded.  {Refresh Note OR Click here to enter BP  :1}***        Physical Exam:   VS:  There were no vitals taken for this visit.   Wt Readings from Last 3 Encounters:  06/03/22 256 lb 6.3 oz (116.3 kg)  02/20/22 267 lb (121.1 kg)  03/05/21 240  lb (108.9 kg)     GEN: Well nourished, well developed in no acute distress NECK: No JVD; No carotid bruits CARDIAC: ***RR, *** murmurs, rubs, gallops RESPIRATORY:  Clear to auscultation without rales, wheezing or rhonchi  ABDOMEN: Soft, non-tender, non-distended EXTREMITIES:  No edema; No deformity   ASSESSMENT AND PLAN:   Chronic systolic heart failure:  ***  he seems to be euvolemic.  No change in therapy.   Chronic atrial fib:  ***  [name] has a CHA2DS2 - VASc score of 6..   Moderate to severe mitral stenosis/ Mild MR:  ***  Echo June 2022.   I will follow another echo when I see her back in 1 year.   Hypertension: Her blood pressure is ***  at target.  No change in therapy.  She says it is elevated today because she did not take her medicines but is typically in the 130s.          Follow up ***  Signed, Rollene Rotunda, MD

## 2023-04-20 ENCOUNTER — Ambulatory Visit: Payer: PRIVATE HEALTH INSURANCE | Attending: Cardiology | Admitting: Cardiology

## 2023-04-20 DIAGNOSIS — I5022 Chronic systolic (congestive) heart failure: Secondary | ICD-10-CM

## 2023-04-20 DIAGNOSIS — I10 Essential (primary) hypertension: Secondary | ICD-10-CM

## 2023-04-20 DIAGNOSIS — I4821 Permanent atrial fibrillation: Secondary | ICD-10-CM

## 2023-05-08 ENCOUNTER — Other Ambulatory Visit: Payer: Self-pay | Admitting: Internal Medicine

## 2023-05-08 DIAGNOSIS — M810 Age-related osteoporosis without current pathological fracture: Secondary | ICD-10-CM

## 2023-05-30 DIAGNOSIS — R0689 Other abnormalities of breathing: Secondary | ICD-10-CM | POA: Diagnosis not present

## 2023-05-30 DIAGNOSIS — R0902 Hypoxemia: Secondary | ICD-10-CM | POA: Diagnosis not present

## 2023-05-30 DIAGNOSIS — I10 Essential (primary) hypertension: Secondary | ICD-10-CM | POA: Diagnosis not present

## 2023-05-30 DIAGNOSIS — R000 Tachycardia, unspecified: Secondary | ICD-10-CM | POA: Diagnosis not present

## 2023-05-30 DIAGNOSIS — I959 Hypotension, unspecified: Secondary | ICD-10-CM | POA: Diagnosis not present

## 2023-05-31 ENCOUNTER — Emergency Department (HOSPITAL_COMMUNITY)

## 2023-05-31 ENCOUNTER — Encounter (HOSPITAL_COMMUNITY): Payer: Self-pay | Admitting: Emergency Medicine

## 2023-05-31 ENCOUNTER — Other Ambulatory Visit: Payer: Self-pay

## 2023-05-31 ENCOUNTER — Inpatient Hospital Stay (HOSPITAL_COMMUNITY)
Admission: EM | Admit: 2023-05-31 | Discharge: 2023-06-08 | DRG: 286 | Disposition: A | Discharge status: Home Health | Attending: Internal Medicine | Admitting: Internal Medicine

## 2023-05-31 ENCOUNTER — Inpatient Hospital Stay (HOSPITAL_COMMUNITY)

## 2023-05-31 DIAGNOSIS — E1165 Type 2 diabetes mellitus with hyperglycemia: Secondary | ICD-10-CM | POA: Diagnosis present

## 2023-05-31 DIAGNOSIS — J9601 Acute respiratory failure with hypoxia: Principal | ICD-10-CM | POA: Diagnosis present

## 2023-05-31 DIAGNOSIS — I050 Rheumatic mitral stenosis: Secondary | ICD-10-CM | POA: Diagnosis present

## 2023-05-31 DIAGNOSIS — I5022 Chronic systolic (congestive) heart failure: Secondary | ICD-10-CM

## 2023-05-31 DIAGNOSIS — Z862 Personal history of diseases of the blood and blood-forming organs and certain disorders involving the immune mechanism: Secondary | ICD-10-CM

## 2023-05-31 DIAGNOSIS — I080 Rheumatic disorders of both mitral and aortic valves: Secondary | ICD-10-CM | POA: Diagnosis not present

## 2023-05-31 DIAGNOSIS — I2721 Secondary pulmonary arterial hypertension: Secondary | ICD-10-CM | POA: Diagnosis present

## 2023-05-31 DIAGNOSIS — Z8673 Personal history of transient ischemic attack (TIA), and cerebral infarction without residual deficits: Secondary | ICD-10-CM

## 2023-05-31 DIAGNOSIS — Z1152 Encounter for screening for COVID-19: Secondary | ICD-10-CM | POA: Diagnosis not present

## 2023-05-31 DIAGNOSIS — R739 Hyperglycemia, unspecified: Secondary | ICD-10-CM | POA: Diagnosis not present

## 2023-05-31 DIAGNOSIS — Z79899 Other long term (current) drug therapy: Secondary | ICD-10-CM | POA: Diagnosis not present

## 2023-05-31 DIAGNOSIS — Z9842 Cataract extraction status, left eye: Secondary | ICD-10-CM

## 2023-05-31 DIAGNOSIS — I4821 Permanent atrial fibrillation: Secondary | ICD-10-CM | POA: Diagnosis present

## 2023-05-31 DIAGNOSIS — Z9841 Cataract extraction status, right eye: Secondary | ICD-10-CM

## 2023-05-31 DIAGNOSIS — Z86718 Personal history of other venous thrombosis and embolism: Secondary | ICD-10-CM | POA: Diagnosis not present

## 2023-05-31 DIAGNOSIS — Z95828 Presence of other vascular implants and grafts: Secondary | ICD-10-CM

## 2023-05-31 DIAGNOSIS — R918 Other nonspecific abnormal finding of lung field: Secondary | ICD-10-CM | POA: Diagnosis not present

## 2023-05-31 DIAGNOSIS — I272 Pulmonary hypertension, unspecified: Secondary | ICD-10-CM | POA: Diagnosis present

## 2023-05-31 DIAGNOSIS — E8729 Other acidosis: Secondary | ICD-10-CM | POA: Diagnosis present

## 2023-05-31 DIAGNOSIS — J9602 Acute respiratory failure with hypercapnia: Secondary | ICD-10-CM | POA: Diagnosis present

## 2023-05-31 DIAGNOSIS — I517 Cardiomegaly: Secondary | ICD-10-CM | POA: Diagnosis not present

## 2023-05-31 DIAGNOSIS — D6862 Lupus anticoagulant syndrome: Secondary | ICD-10-CM | POA: Diagnosis present

## 2023-05-31 DIAGNOSIS — I5023 Acute on chronic systolic (congestive) heart failure: Secondary | ICD-10-CM | POA: Diagnosis not present

## 2023-05-31 DIAGNOSIS — Z6841 Body Mass Index (BMI) 40.0 and over, adult: Secondary | ICD-10-CM

## 2023-05-31 DIAGNOSIS — E66813 Obesity, class 3: Secondary | ICD-10-CM

## 2023-05-31 DIAGNOSIS — R6510 Systemic inflammatory response syndrome (SIRS) of non-infectious origin without acute organ dysfunction: Secondary | ICD-10-CM | POA: Diagnosis present

## 2023-05-31 DIAGNOSIS — I482 Chronic atrial fibrillation, unspecified: Secondary | ICD-10-CM | POA: Diagnosis present

## 2023-05-31 DIAGNOSIS — E1122 Type 2 diabetes mellitus with diabetic chronic kidney disease: Secondary | ICD-10-CM | POA: Diagnosis not present

## 2023-05-31 DIAGNOSIS — Z8249 Family history of ischemic heart disease and other diseases of the circulatory system: Secondary | ICD-10-CM

## 2023-05-31 DIAGNOSIS — N1831 Chronic kidney disease, stage 3a: Secondary | ICD-10-CM | POA: Diagnosis not present

## 2023-05-31 DIAGNOSIS — R0603 Acute respiratory distress: Secondary | ICD-10-CM | POA: Diagnosis not present

## 2023-05-31 DIAGNOSIS — F32A Depression, unspecified: Secondary | ICD-10-CM | POA: Diagnosis present

## 2023-05-31 DIAGNOSIS — I700 Atherosclerosis of aorta: Secondary | ICD-10-CM | POA: Diagnosis not present

## 2023-05-31 DIAGNOSIS — I10 Essential (primary) hypertension: Secondary | ICD-10-CM | POA: Diagnosis present

## 2023-05-31 DIAGNOSIS — Z7901 Long term (current) use of anticoagulants: Secondary | ICD-10-CM | POA: Diagnosis not present

## 2023-05-31 DIAGNOSIS — R0602 Shortness of breath: Secondary | ICD-10-CM | POA: Diagnosis not present

## 2023-05-31 DIAGNOSIS — R7303 Prediabetes: Secondary | ICD-10-CM

## 2023-05-31 DIAGNOSIS — D72829 Elevated white blood cell count, unspecified: Secondary | ICD-10-CM | POA: Diagnosis present

## 2023-05-31 DIAGNOSIS — I5033 Acute on chronic diastolic (congestive) heart failure: Secondary | ICD-10-CM | POA: Diagnosis not present

## 2023-05-31 DIAGNOSIS — J810 Acute pulmonary edema: Secondary | ICD-10-CM | POA: Diagnosis not present

## 2023-05-31 DIAGNOSIS — Z888 Allergy status to other drugs, medicaments and biological substances status: Secondary | ICD-10-CM

## 2023-05-31 DIAGNOSIS — E876 Hypokalemia: Secondary | ICD-10-CM | POA: Diagnosis present

## 2023-05-31 DIAGNOSIS — R7989 Other specified abnormal findings of blood chemistry: Secondary | ICD-10-CM | POA: Insufficient documentation

## 2023-05-31 DIAGNOSIS — J811 Chronic pulmonary edema: Secondary | ICD-10-CM | POA: Insufficient documentation

## 2023-05-31 DIAGNOSIS — I4891 Unspecified atrial fibrillation: Secondary | ICD-10-CM | POA: Diagnosis not present

## 2023-05-31 DIAGNOSIS — Z86711 Personal history of pulmonary embolism: Secondary | ICD-10-CM

## 2023-05-31 DIAGNOSIS — I130 Hypertensive heart and chronic kidney disease with heart failure and stage 1 through stage 4 chronic kidney disease, or unspecified chronic kidney disease: Secondary | ICD-10-CM | POA: Diagnosis not present

## 2023-05-31 DIAGNOSIS — Z961 Presence of intraocular lens: Secondary | ICD-10-CM | POA: Diagnosis present

## 2023-05-31 DIAGNOSIS — I2489 Other forms of acute ischemic heart disease: Secondary | ICD-10-CM | POA: Diagnosis not present

## 2023-05-31 DIAGNOSIS — Z833 Family history of diabetes mellitus: Secondary | ICD-10-CM

## 2023-05-31 DIAGNOSIS — Z7982 Long term (current) use of aspirin: Secondary | ICD-10-CM

## 2023-05-31 LAB — RESP PANEL BY RT-PCR (RSV, FLU A&B, COVID)  RVPGX2
Influenza A by PCR: NEGATIVE
Influenza B by PCR: NEGATIVE
Resp Syncytial Virus by PCR: NEGATIVE
SARS Coronavirus 2 by RT PCR: NEGATIVE

## 2023-05-31 LAB — TYPE AND SCREEN
ABO/RH(D): O POS
Antibody Screen: NEGATIVE

## 2023-05-31 LAB — I-STAT VENOUS BLOOD GAS, ED
Acid-base deficit: 6 mmol/L — ABNORMAL HIGH (ref 0.0–2.0)
Bicarbonate: 23.4 mmol/L (ref 20.0–28.0)
Calcium, Ion: 1.06 mmol/L — ABNORMAL LOW (ref 1.15–1.40)
HCT: 48 % — ABNORMAL HIGH (ref 36.0–46.0)
Hemoglobin: 16.3 g/dL — ABNORMAL HIGH (ref 12.0–15.0)
O2 Saturation: 87 %
Potassium: 4.8 mmol/L (ref 3.5–5.1)
Sodium: 136 mmol/L (ref 135–145)
TCO2: 25 mmol/L (ref 22–32)
pCO2, Ven: 58.3 mmHg (ref 44–60)
pH, Ven: 7.211 — ABNORMAL LOW (ref 7.25–7.43)
pO2, Ven: 64 mmHg — ABNORMAL HIGH (ref 32–45)

## 2023-05-31 LAB — COMPREHENSIVE METABOLIC PANEL
ALT: 19 U/L (ref 0–44)
AST: 26 U/L (ref 15–41)
Albumin: 3.4 g/dL — ABNORMAL LOW (ref 3.5–5.0)
Alkaline Phosphatase: 100 U/L (ref 38–126)
Anion gap: 17 — ABNORMAL HIGH (ref 5–15)
BUN: 17 mg/dL (ref 8–23)
CO2: 22 mmol/L (ref 22–32)
Calcium: 8.8 mg/dL — ABNORMAL LOW (ref 8.9–10.3)
Chloride: 98 mmol/L (ref 98–111)
Creatinine, Ser: 1.23 mg/dL — ABNORMAL HIGH (ref 0.44–1.00)
GFR, Estimated: 45 mL/min — ABNORMAL LOW (ref >60)
Glucose, Bld: 295 mg/dL — ABNORMAL HIGH (ref 70–99)
Potassium: 4.8 mmol/L (ref 3.5–5.1)
Sodium: 137 mmol/L (ref 135–145)
Total Bilirubin: 1.3 mg/dL — ABNORMAL HIGH (ref 0.0–1.2)
Total Protein: 8 g/dL (ref 6.5–8.1)

## 2023-05-31 LAB — ECHOCARDIOGRAM COMPLETE
AR max vel: 1.27 cm2
AV Area VTI: 1.14 cm2
AV Area mean vel: 1.22 cm2
AV Mean grad: 9 mmHg
AV Peak grad: 15.4 mmHg
Ao pk vel: 1.96 m/s
Area-P 1/2: 1.03 cm2
Est EF: 55
Height: 65 in
MV VTI: 0.71 cm2
S' Lateral: 3.2 cm
Weight: 4102.32 [oz_av]

## 2023-05-31 LAB — I-STAT ARTERIAL BLOOD GAS, ED
Acid-base deficit: 3 mmol/L — ABNORMAL HIGH (ref 0.0–2.0)
Bicarbonate: 25.2 mmol/L (ref 20.0–28.0)
Calcium, Ion: 1.15 mmol/L (ref 1.15–1.40)
HCT: 45 % (ref 36.0–46.0)
Hemoglobin: 15.3 g/dL — ABNORMAL HIGH (ref 12.0–15.0)
O2 Saturation: 100 %
Potassium: 4.2 mmol/L (ref 3.5–5.1)
Sodium: 138 mmol/L (ref 135–145)
TCO2: 27 mmol/L (ref 22–32)
pCO2 arterial: 57.7 mmHg — ABNORMAL HIGH (ref 32–48)
pH, Arterial: 7.249 — ABNORMAL LOW (ref 7.35–7.45)
pO2, Arterial: 356 mmHg — ABNORMAL HIGH (ref 83–108)

## 2023-05-31 LAB — HEMOGLOBIN A1C
Hgb A1c MFr Bld: 5.8 % — ABNORMAL HIGH (ref 4.8–5.6)
Mean Plasma Glucose: 119.76 mg/dL

## 2023-05-31 LAB — CBC
HCT: 46.3 % — ABNORMAL HIGH (ref 36.0–46.0)
Hemoglobin: 14.7 g/dL (ref 12.0–15.0)
MCH: 32.2 pg (ref 26.0–34.0)
MCHC: 31.7 g/dL (ref 30.0–36.0)
MCV: 101.5 fL — ABNORMAL HIGH (ref 80.0–100.0)
Platelets: 197 10*3/uL (ref 150–400)
RBC: 4.56 MIL/uL (ref 3.87–5.11)
RDW: 13.2 % (ref 11.5–15.5)
WBC: 11.5 10*3/uL — ABNORMAL HIGH (ref 4.0–10.5)
nRBC: 0 % (ref 0.0–0.2)

## 2023-05-31 LAB — I-STAT CG4 LACTIC ACID, ED
Lactic Acid, Venous: 2 mmol/L (ref 0.5–1.9)
Lactic Acid, Venous: 2.6 mmol/L (ref 0.5–1.9)

## 2023-05-31 LAB — CBG MONITORING, ED
Glucose-Capillary: 110 mg/dL — ABNORMAL HIGH (ref 70–99)
Glucose-Capillary: 124 mg/dL — ABNORMAL HIGH (ref 70–99)
Glucose-Capillary: 149 mg/dL — ABNORMAL HIGH (ref 70–99)
Glucose-Capillary: 152 mg/dL — ABNORMAL HIGH (ref 70–99)
Glucose-Capillary: 215 mg/dL — ABNORMAL HIGH (ref 70–99)

## 2023-05-31 LAB — TROPONIN I (HIGH SENSITIVITY)
Troponin I (High Sensitivity): 135 ng/L (ref <18)
Troponin I (High Sensitivity): 128 ng/L (ref <18)

## 2023-05-31 LAB — TSH: TSH: 1.045 u[IU]/mL (ref 0.350–4.500)

## 2023-05-31 LAB — PROTIME-INR
INR: 2.3 — ABNORMAL HIGH (ref 0.8–1.2)
Prothrombin Time: 25.3 s — ABNORMAL HIGH (ref 11.4–15.2)

## 2023-05-31 LAB — PROCALCITONIN: Procalcitonin: 12.4 ng/mL

## 2023-05-31 LAB — BRAIN NATRIURETIC PEPTIDE: B Natriuretic Peptide: 715.1 pg/mL — ABNORMAL HIGH (ref 0.0–100.0)

## 2023-05-31 MED ORDER — FUROSEMIDE 10 MG/ML IJ SOLN: 40.0000 mg | Freq: Once | INTRAMUSCULAR | Status: DC

## 2023-05-31 MED ORDER — FUROSEMIDE 10 MG/ML IJ SOLN
40.0000 mg | Freq: Once | INTRAMUSCULAR | Status: AC
  Administered 2023-05-31: 40 mg via INTRAVENOUS
  Filled 2023-05-31: qty 4

## 2023-05-31 MED ORDER — SODIUM CHLORIDE 0.9 % IV SOLN: 100.0000 mg | Freq: Two times a day (BID) | INTRAVENOUS | Status: DC

## 2023-05-31 MED ORDER — SODIUM CHLORIDE 0.9 % IV SOLN
2.0000 g | INTRAVENOUS | Status: AC
  Administered 2023-05-31 – 2023-06-04 (×5): 2 g via INTRAVENOUS
  Filled 2023-05-31 (×5): qty 20

## 2023-05-31 MED ORDER — WARFARIN - PHARMACIST DOSING INPATIENT
Freq: Every day | Status: DC
  Administered 2023-06-04: 1

## 2023-05-31 MED ORDER — PERFLUTREN LIPID MICROSPHERE
1.0000 mL | INTRAVENOUS | Status: AC | PRN
  Administered 2023-05-31: 1.5 mL via INTRAVENOUS

## 2023-05-31 MED ORDER — WARFARIN SODIUM 4 MG PO TABS
4.5000 mg | ORAL_TABLET | ORAL | Status: DC
  Administered 2023-06-01 – 2023-06-03 (×2): 4.5 mg via ORAL
  Filled 2023-05-31 (×2): qty 1

## 2023-05-31 MED ORDER — ACETAMINOPHEN 325 MG PO TABS
650.0000 mg | ORAL_TABLET | Freq: Four times a day (QID) | ORAL | Status: DC | PRN
  Administered 2023-06-01 – 2023-06-05 (×5): 650 mg via ORAL
  Filled 2023-05-31 (×5): qty 2

## 2023-05-31 MED ORDER — LOSARTAN POTASSIUM 50 MG PO TABS
50.0000 mg | ORAL_TABLET | Freq: Two times a day (BID) | ORAL | Status: DC
  Administered 2023-05-31 (×2): 50 mg via ORAL
  Filled 2023-05-31 (×3): qty 1

## 2023-05-31 MED ORDER — AMIODARONE HCL IN DEXTROSE 360-4.14 MG/200ML-% IV SOLN: 30.0000 mg/h | INTRAVENOUS | Status: DC

## 2023-05-31 MED ORDER — SODIUM CHLORIDE 0.9% FLUSH
3.0000 mL | Freq: Two times a day (BID) | INTRAVENOUS | Status: DC
  Administered 2023-05-31 – 2023-06-08 (×14): 3 mL via INTRAVENOUS

## 2023-05-31 MED ORDER — AMIODARONE HCL IN DEXTROSE 360-4.14 MG/200ML-% IV SOLN: 60.0000 mg/h | INTRAVENOUS | Status: DC

## 2023-05-31 MED ORDER — INSULIN ASPART 100 UNIT/ML IJ SOLN
0.0000 [IU] | INTRAMUSCULAR | Status: DC
  Administered 2023-05-31: 1 [IU] via SUBCUTANEOUS
  Administered 2023-05-31: 3 [IU] via SUBCUTANEOUS
  Administered 2023-05-31: 2 [IU] via SUBCUTANEOUS
  Administered 2023-05-31: 1 [IU] via SUBCUTANEOUS
  Administered 2023-06-01: 2 [IU] via SUBCUTANEOUS
  Administered 2023-06-01 – 2023-06-03 (×6): 1 [IU] via SUBCUTANEOUS
  Administered 2023-06-03: 3 [IU] via SUBCUTANEOUS
  Administered 2023-06-04 (×3): 1 [IU] via SUBCUTANEOUS
  Administered 2023-06-04: 2 [IU] via SUBCUTANEOUS
  Administered 2023-06-05: 1 [IU] via SUBCUTANEOUS
  Administered 2023-06-05 (×2): 2 [IU] via SUBCUTANEOUS
  Administered 2023-06-06: 1 [IU] via SUBCUTANEOUS
  Administered 2023-06-06: 3 [IU] via SUBCUTANEOUS
  Administered 2023-06-06 – 2023-06-07 (×2): 2 [IU] via SUBCUTANEOUS
  Administered 2023-06-07 – 2023-06-08 (×3): 1 [IU] via SUBCUTANEOUS

## 2023-05-31 MED ORDER — ACETAMINOPHEN 650 MG RE SUPP: 650.0000 mg | Freq: Four times a day (QID) | RECTAL | Status: DC | PRN

## 2023-05-31 MED ORDER — HYDRALAZINE HCL 20 MG/ML IJ SOLN
10.0000 mg | INTRAMUSCULAR | Status: DC | PRN
  Administered 2023-05-31: 10 mg via INTRAVENOUS
  Filled 2023-05-31: qty 1

## 2023-05-31 MED ORDER — FLUOXETINE HCL 20 MG PO CAPS: 20.0000 mg | ORAL_CAPSULE | Freq: Every day | ORAL | Status: DC | PRN

## 2023-05-31 MED ORDER — WARFARIN SODIUM 3 MG PO TABS
3.0000 mg | ORAL_TABLET | ORAL | Status: DC
  Administered 2023-05-31 – 2023-06-04 (×3): 3 mg via ORAL
  Filled 2023-05-31 (×3): qty 1

## 2023-05-31 MED ORDER — ALBUTEROL SULFATE (2.5 MG/3ML) 0.083% IN NEBU
2.5000 mg | INHALATION_SOLUTION | RESPIRATORY_TRACT | Status: DC | PRN
  Administered 2023-06-02: 2.5 mg via RESPIRATORY_TRACT
  Filled 2023-05-31: qty 3

## 2023-05-31 MED ORDER — NITROGLYCERIN IN D5W 200-5 MCG/ML-% IV SOLN
0.0000 ug/min | INTRAVENOUS | Status: DC
Start: 2023-05-31 — End: 2023-06-01
  Administered 2023-05-31: 5 ug/min via INTRAVENOUS
  Filled 2023-05-31: qty 250

## 2023-05-31 MED ORDER — METOPROLOL TARTRATE 25 MG PO TABS
25.0000 mg | ORAL_TABLET | Freq: Two times a day (BID) | ORAL | Status: DC
  Administered 2023-05-31 – 2023-06-08 (×17): 25 mg via ORAL
  Filled 2023-05-31 (×17): qty 1

## 2023-05-31 MED ORDER — DOXYCYCLINE HYCLATE 100 MG PO TABS
100.0000 mg | ORAL_TABLET | Freq: Two times a day (BID) | ORAL | Status: AC
  Administered 2023-05-31 – 2023-06-05 (×10): 100 mg via ORAL
  Filled 2023-05-31 (×10): qty 1

## 2023-05-31 NOTE — Progress Notes (Addendum)
 Cardiology Consultation   Patient ID: [name] MRN: [id number]; DOB: 05-Sep-1945  Admit date: 05/31/2023 Date of Consult: 05/31/2023  PCP:  Marden Noble, MD (Inactive)   [facility] Providers Cardiologist:  Rollene Rotunda, MD   {  Patient Profile:   [name] is a 78 y.o. female with a hx of hypertension,HFmrEF, atrial fibrillation on warfarin, mitral stenosis, history of CVA, history of DVT/PE, lupus anticoagulant syndrome, depression who is being seen 05/31/2023 for the evaluation of flash pulmonary edema, atrial fibrillation at the request of Dr. Katrinka Blazing.  History of Present Illness:   [name] has a past medical history of hypertension,HFmrEF, atrial fibrillation on warfarin, mitral stenosis, history of CVA, history of DVT/PE, lupus anticoagulant syndrome, depression.  She presented to the [facility] ED on 05/31/2023 via EMS complaining of shortness of breath, and respiratory distress requiring CPAP en route. Per EMS she was short of breath with labored breathing with SpO2 of 70%.  Her family stated that she had recently been short of breath the evening prior and had been coughing up pink-tinged sputum.  Patient also reported feeling generally unwell, feeling nauseous, vomiting.  Relevant workup in the ED includes: EKG showing A-fib with RVR, heart rate 132 bpm.  VBG showing pH 7.2, O2 64.  CBC showed leukocytosis WBC 11.5, hemoglobin 14.7, hematocrit 46.3.  INR 2.3.  BNP 715.  Creatinine 1.23.  Troponin 135 > 128.  CXR showed hazy opacities projecting over the lower lungs suspicious for pneumonia or edema.  ABG showed pH 7.2, CO2 57, O2 356, after being placed on BiPAP.  Lactic acid 2.0 > 2.6.  Respiratory panel negative for COVID, flu A/B.  Hemoglobin A1c 5.6%.  TSH normal. Procal normal.  Echocardiogram showed: LVEF 55%, no LV R WMA, mild LVH, normal RV function, biatrial enlargement, rheumatic mitral valve with mean gradient 14, peak 22, trivial MR, moderate to  severe MS, mild AR, normal IVC  Patient was started on a nitroglycerin drip, placed on BiPAP, given IV Lasix 40 mg x 1, given hydralazine 10 mg IV x 1, resumed on warfarin per pharmacy, started on insulin.  Patient's blood pressure remains fairly uncontrolled while in the ED, last reading 147/110 while on a nitroglycerin drip and after being given hydralazine 10 mg IV x 1.  Patient was admitted to medicine service, cardiology asked to consult.  Patient was last seen in our office by Dr. Antoine Poche December 2022 for atrial fibrillation, chronic systolic heart failure, moderate to severe mitral stenosis, mild mitral regurg, hypertension.  At this visit she was stable and her medication regimen was as follows: ASA 81 mg daily, Lasix p.o. 40 mg daily, Lopressor 25 mg twice daily, potassium supplement 10 mEq daily, warfarin as directed by clinic.  After speaking with the patient and her daughter, they agree with the history as stated above.  Patient is currently on 5 L of oxygen via nasal cannula, off BiPAP.  States she feels significantly better than when she came in.  Believes that her symptoms started late last night, with nausea, vomiting, shortness of breath.  She denies any previous or current chest pain.  She denies any syncopal or near syncopal episodes.  She states she has been compliant with all of her medications.   Past Medical History:  Diagnosis Date   (HFpEF) heart failure with preserved ejection fraction (HCC)    a. 06/2008 Echo: EF 55-60%, mild LVH, triv AI, mild to mod MS, mod to sev dil LA,  mildly dil RA.   Acute right MCA stroke (HCC)    a. 06/04/2010 Embolic stroke treated with TPA and thrombectomy with hemorrhagic transformation; Carotids 3/12 negative for ICA stenosis.   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 08/2005 - s/p IVC filter.   CHF (congestive heart failure) (HCC)    Depression    Embolus of femoral artery (HCC)     a. 06/2008 s/p bilateral embolectomies.   History of DVT (deep vein thrombosis)    a. s/p IVC filter.   HIT (heparin-induced thrombocytopenia) (HCC)    HTN (hypertension)    Lupus anticoagulant disorder (HCC)    Mitral stenosis    moderate-severe 09/04/20 echo   Obesity, unspecified    Permanent atrial fibrillation (HCC)    a. On chronic Coumadin - INRs followed by PCP; b. CHA2DS2VASc = 5.   Popliteal artery embolism, right (HCC)    a. 01/2017 in the setting of subRx INR-->s/p embolectomy.   Past Surgical History:  Procedure Laterality Date   APPLICATION OF WOUND VAC  12/09/2020   Procedure: APPLICATION OF WOUND VAC;  Surgeon: Nadara Mustard, MD;  Location: [facility] OR;  Service: Orthopedics;;   distal radius fracture. (12,/16/2010)     EMBOLECTOMY Right 02/15/2017   Procedure: EMBOLECTOMY RIGHT LEG;  Surgeon: Sherren Kerns, MD;  Location: [facility] OR;  Service: Vascular;  Laterality: Right;   EYE SURGERY Bilateral 2021   cataracts removed   I & D EXTREMITY Right 03/05/2017   Procedure: IRRIGATION AND DEBRIDEMENT RIGHT LEG HEMATOMA EVACUATION;  Surgeon: Sherren Kerns, MD;  Location: [facility] OR;  Service: Vascular;  Laterality: Right;   IVC Placement 08/30/2005     LIPOMA EXCISION Left 12/09/2020   Procedure: EXCISION LIPOMA LEFT THIGH;  Surgeon: Nadara Mustard, MD;  Location: [facility] OR;  Service: Orthopedics;  Laterality: Left;   Open reduction and internal fixation of left intra-articular      Home Medications:  Prior to Admission medications   Medication Sig Start Date End Date Taking? Authorizing Provider  acetaminophen (TYLENOL) 500 MG tablet Take 2 tablets (1,000 mg total) by mouth 3 (three) times daily. Patient taking differently: Take 1,000 mg by mouth every 8 (eight) hours as needed for mild pain (pain score 1-3). 06/03/22  Yes Dahal, Melina Schools, MD  Calcium Carb-Cholecalciferol (CALCIUM 600 + D PO) Take 1 tablet by mouth 2 (two) times daily.   Yes [provider]  Cholecalciferol  (VITAMIN D) 50 MCG (2000 UT) tablet Take 2,000 Units by mouth daily.   Yes [provider]  FLUoxetine (PROZAC) 20 MG capsule Take 20 mg by mouth daily as needed (for anxiety). 04/12/17  Yes [provider]  furosemide (LASIX) 40 MG tablet Take 1 tablet (40 mg total) by mouth daily as needed for fluid. Patient taking differently: Take 40 mg by mouth daily. 06/03/22  Yes Dahal, Melina Schools, MD  losartan (COZAAR) 50 MG tablet Take 50 mg by mouth 2 (two) times daily. 05/08/23  Yes [provider]  metoprolol tartrate (LOPRESSOR) 25 MG tablet Take 1 tablet (25 mg total) by mouth 2 (two) times daily. 02/03/23  Yes Hochrein, Fayrene Fearing, MD  potassium chloride (K-DUR) 10 MEQ tablet TAKE 1 TABLET BY MOUTH ONCE DAILY Patient taking differently: Take 10 mEq by mouth daily. 04/23/18  Yes Abelino Derrick, PA-C  warfarin (COUMADIN) 3 MG tablet Take 1.5-2 tablets (4.5-6 mg total) by mouth See  admin instructions. TAKE 2 TABLETS BY MOUTH DAILY EXCEPT 1 AND 1/2 TABLETS ON TUESDAY, THURSDAY, AND SATURDAY OR AS DIRECTED BY COUMADIN CLINIC; NEEDS INR CHECK CALL OFFICE Patient taking differently: Take 3-4.5 mg by mouth See admin instructions. Take 1 tablet by mouth every Mon, Wed, Fri, Sun and take 1 and 1/2 tablets all other days OR AS DIRECTED BY COUMADIN CLINIC; NEEDS INR CHECK CALL OFFICE 06/05/22  Yes Dahal, Melina Schools, MD   Inpatient Medications: Scheduled Meds:  furosemide  40 mg Intravenous Once   insulin aspart  0-9 Units Subcutaneous Q4H   sodium chloride flush  3 mL Intravenous Q12H   warfarin  3 mg Oral Once per day on Sunday Monday Wednesday Friday   And   [START ON 06/01/2023] warfarin  4.5 mg Oral Once per day on Tuesday Thursday Saturday   Warfarin - Pharmacist Dosing Inpatient   Does not apply q1600   Continuous Infusions:  nitroGLYCERIN 15 mcg/min (05/31/23 1410)   PRN Meds: acetaminophen **OR** acetaminophen, albuterol, hydrALAZINE, perflutren lipid microspheres (DEFINITY) IV  suspension  Allergies:    Allergies  Allergen Reactions   Heparin Hives    Patient attests severe allergy- rash all over, severe itching, unable to take heparin  Can take lovenox   Other Itching and Rash    "Sheets and Linens used by [name]" Mainly while getting the heparin   Social History:   Social History   Socioeconomic History   Marital status: Single    Spouse name: Not on file   Number of children: Not on file   Years of education: Not on file   Highest education level: Not on file  Occupational History   Not on file  Tobacco Use   Smoking status: Never   Smokeless tobacco: Never  Vaping Use   Vaping status: Never Used  Substance and Sexual Activity   Alcohol use: No   Drug use: No   Sexual activity: Not Currently  Other Topics Concern   Not on file  Social History Narrative    Lives alone.  She works at a Museum/gallery curator.   Daughter in area to assist if needed.  One-level home, 5-6 steps to   entry.  Functional history prior to admission was independent,   functional status upon admission to rehab services was +2, total assist   bed mobility,+2 total assist transfers, ambulation not tested, moderate   assist upper body, total assist lower body, activities of daily living.       Social Drivers of Corporate investment banker Strain: Not on file  Food Insecurity: No Food Insecurity (05/31/2022)   Hunger Vital Sign    Worried About Running Out of Food in the Last Year: Never true    Ran Out of Food in the Last Year: Never true  Transportation Needs: No Transportation Needs (05/31/2022)   PRAPARE - Administrator, Civil Service (Medical): No    Lack of Transportation (Non-Medical): No  Physical Activity: Not on file  Stress: Not on file  Social Connections: Not on file  Intimate Partner Violence: Not At Risk (05/31/2022)   Humiliation, Afraid, Rape, and Kick questionnaire    Fear of Current or Ex-Partner: No    Emotionally Abused: No     Physically Abused: No    Sexually Abused: No    Family History:   Family History  Problem Relation Age of Onset   Coronary artery disease Other    Diabetes Other  ROS:  Please see the history of present illness.  All other ROS reviewed and negative.     Physical Exam/Data:   Vitals:   05/31/23 1145 05/31/23 1200 05/31/23 1215 05/31/23 1230  BP: (!) 156/117 (!) 161/140 (!) 147/108 (!) 147/110  Pulse: 93 99 92 (!) 103  Resp: (!) 21 (!) 28 (!) 23 (!) 21  Temp: 98.1 F (36.7 C)     TempSrc:      SpO2: 99% 100% 100% 100%  Weight:      Height:        Intake/Output Summary (Last 24 hours) at 05/31/2023 1420 Last data filed at 05/31/2023 1412 Gross per 24 hour  Intake --  Output 550 ml  Net -550 ml      05/31/2023   12:37 AM 06/03/2022    5:00 AM 06/02/2022    5:00 AM  Last 3 Weights  Weight (lbs) 256 lb 6.3 oz 256 lb 6.3 oz 253 lb 8.5 oz  Weight (kg) 116.3 kg 116.3 kg 115 kg     Body mass index is 42.67 kg/m.  General:  resting, in no acute distress, on 5 L oxygen via [place] HEENT: normal Neck: no JVD Vascular: Distal pulses 2+ bilaterally Cardiac:  irregular rhythm, regular rate, distant heart sounds Lungs:  diminished lung sounds at bilateral bases   Abd: soft, nontender Ext: no edema Musculoskeletal:  No deformities Skin: warm and dry  Neuro:  no focal abnormalities noted Psych:  Normal affect   EKG:  The EKG was personally reviewed and demonstrates: Atrial fibrillation with RVR heart rate 132 bpm  Telemetry:  Telemetry was personally reviewed and demonstrates:  atrial fibrillation, HR 80-90s  Relevant CV Studies: Echocardiogram 05/31/2023 IMPRESSIONS   1. Abnormal septal motion . Left ventricular ejection fraction, by  estimation, is 55%. The left ventricle has normal function. The left  ventricle has no regional wall motion abnormalities. There is mild left  ventricular hypertrophy. Left ventricular diastolic parameters are indeterminate.   2. Right ventricular  systolic function is normal. The right ventricular  size is normal.   3. Left atrial size was severely dilated.   4. Right atrial size was moderately dilated.   5. Rheuamtic MV with mean gradient 14 peak 22.4 mmHg at HR 83 bpm and MVA  1.03 cm2. The mitral valve is rheumatic. Trivial mitral valve regurgitation. Moderate to severe mitral stenosis.   6. The aortic valve is tricuspid. There is moderate calcification of the  aortic valve. There is moderate thickening of the aortic valve. Aortic  valve regurgitation is mild. Aortic valve sclerosis is present, with no  evidence of aortic valve stenosis.   7. The inferior vena cava is normal in size with greater than 50%  respiratory variability, suggesting right atrial pressure of 3 mmHg.   Laboratory Data:  High Sensitivity Troponin:   Recent Labs  Lab 05/31/23 0020 05/31/23 0251  TROPONINIHS 135* 128*     Chemistry Recent Labs  Lab 05/31/23 0020 05/31/23 0113  NA 137  136 138  K 4.8  4.8 4.2  CL 98  --   CO2 22  --   GLUCOSE 295*  --   BUN 17  --   CREATININE 1.23*  --   CALCIUM 8.8*  --   GFRNONAA 45*  --   ANIONGAP 17*  --     Recent Labs  Lab 05/31/23 0020  PROT 8.0  ALBUMIN 3.4*  AST 26  ALT 19  ALKPHOS 100  BILITOT  1.3*   Lipids No results for input(s): "CHOL", "TRIG", "HDL", "LABVLDL", "LDLCALC", "CHOLHDL" in the last 168 hours.  Hematology Recent Labs  Lab 05/31/23 0020 05/31/23 0113  WBC 11.5*  --   RBC 4.56  --   HGB 14.7  16.3* 15.3*  HCT 46.3*  48.0* 45.0  MCV 101.5*  --   MCH 32.2  --   MCHC 31.7  --   RDW 13.2  --   PLT 197  --    Thyroid  Recent Labs  Lab 05/31/23 0800  TSH 1.045    BNP Recent Labs  Lab 05/31/23 0020  BNP 715.1*    DDimer No results for input(s): "DDIMER" in the last 168 hours.  Radiology/Studies:  ECHOCARDIOGRAM COMPLETE Result Date: 05/31/2023    ECHOCARDIOGRAM REPORT   Patient Name:   Lorelai JASSMINE VANDRUFF Date of Exam: 05/31/2023 Medical Rec #:  [id number]        Height:       65.0 in Accession #:    [id number]      Weight:       256.4 lb Date of Birth:  1945-09-27       BSA:          2.198 m Patient Age:    77 years        BP:           153/95 mmHg Patient Gender: F               HR:           111 bpm. Exam Location:  Inpatient Procedure: 2D Echo, Color Doppler, Cardiac Doppler and Intracardiac            Opacification Agent (Both Spectral and Color Flow Doppler were            utilized during procedure). Indications:    I48.91* Unspecified atrial fibrillation  History:        Patient has prior history of Echocardiogram examinations. CHF,                 Arrythmias:Atrial Fibrillation; Risk Factors:Hypertension.  Sonographer:    Tiney Rouge RDCS Referring Phys: [id number] RONDELL A SMITH IMPRESSIONS  1. Abnormal septal motion . Left ventricular ejection fraction, by estimation, is 55%. The left ventricle has normal function. The left ventricle has no regional wall motion abnormalities. There is mild left ventricular hypertrophy. Left ventricular diastolic parameters are indeterminate.  2. Right ventricular systolic function is normal. The right ventricular size is normal.  3. Left atrial size was severely dilated.  4. Right atrial size was moderately dilated.  5. Rheuamtic MV with mean gradient 14 peak 22.4 mmHg at HR 83 bpm and MVA 1.03 cm2. The mitral valve is rheumatic. Trivial mitral valve regurgitation. Moderate to severe mitral stenosis.  6. The aortic valve is tricuspid. There is moderate calcification of the aortic valve. There is moderate thickening of the aortic valve. Aortic valve regurgitation is mild. Aortic valve sclerosis is present, with no evidence of aortic valve stenosis.  7. The inferior vena cava is normal in size with greater than 50% respiratory variability, suggesting right atrial pressure of 3 mmHg. FINDINGS  Left Ventricle: Abnormal septal motion. Left ventricular ejection fraction, by estimation, is 55%. The left ventricle has normal function. The  left ventricle has no regional wall motion abnormalities. Definity contrast agent was given IV to delineate the left ventricular endocardial borders. Strain was performed and the global longitudinal strain is indeterminate. The left  ventricular internal cavity size was normal in size. There is mild left ventricular hypertrophy. Left ventricular diastolic parameters are indeterminate. Right Ventricle: The right ventricular size is normal. No increase in right ventricular wall thickness. Right ventricular systolic function is normal. Left Atrium: Left atrial size was severely dilated. Right Atrium: Right atrial size was moderately dilated. Pericardium: There is no evidence of pericardial effusion. Mitral Valve: Rheuamtic MV with mean gradient 14 peak 22.4 mmHg at HR 83 bpm and MVA 1.03 cm2. The mitral valve is rheumatic. Trivial mitral valve regurgitation. Moderate to severe mitral valve stenosis. MV peak gradient, 22.4 mmHg. The mean mitral valve  gradient is 14.0 mmHg. Tricuspid Valve: The tricuspid valve is normal in structure. Tricuspid valve regurgitation is mild . No evidence of tricuspid stenosis. Aortic Valve: The aortic valve is tricuspid. There is moderate calcification of the aortic valve. There is moderate thickening of the aortic valve. Aortic valve regurgitation is mild. Aortic valve sclerosis is present, with no evidence of aortic valve stenosis. Aortic valve mean gradient measures 9.0 mmHg. Aortic valve peak gradient measures 15.4 mmHg. Aortic valve area, by VTI measures 1.14 cm. Pulmonic Valve: The pulmonic valve was normal in structure. Pulmonic valve regurgitation is not visualized. No evidence of pulmonic stenosis. Aorta: The aortic root is normal in size and structure. Venous: The inferior vena cava is normal in size with greater than 50% respiratory variability, suggesting right atrial pressure of 3 mmHg. IAS/Shunts: No atrial level shunt detected by color flow Doppler. Additional Comments: 3D  was performed not requiring image post processing on an independent workstation and was indeterminate.  LEFT VENTRICLE PLAX 2D LVIDd:         4.10 cm   Diastology LVIDs:         3.20 cm   LV e' medial:   8.16 cm/s LV PW:         1.30 cm   LV E/e' medial: 27.0 LV IVS:        1.30 cm LVOT diam:     1.90 cm LV SV:         41 LV SV Index:   19 LVOT Area:     2.84 cm  IVC IVC diam: 2.10 cm LEFT ATRIUM              Index         RIGHT ATRIUM           Index LA diam:        6.40 cm  2.91 cm/m    RA Area:     32.90 cm LA Vol (A2C):   258.0 ml 117.36 ml/m  RA Volume:   107.00 ml 48.67 ml/m LA Vol (A4C):   266.0 ml 121.00 ml/m LA Biplane Vol: 268.0 ml 121.91 ml/m  AORTIC VALVE AV Area (Vmax):    1.27 cm AV Area (Vmean):   1.22 cm AV Area (VTI):     1.14 cm AV Vmax:           196.00 cm/s AV Vmean:          142.000 cm/s AV VTI:            0.363 m AV Peak Grad:      15.4 mmHg AV Mean Grad:      9.0 mmHg LVOT Vmax:         87.70 cm/s LVOT Vmean:        60.950 cm/s LVOT VTI:  0.146 m LVOT/AV VTI ratio: 0.40  AORTA Ao Root diam: 2.70 cm Ao Asc diam:  3.50 cm MITRAL VALVE MV Area (PHT): 1.03 cm     SHUNTS MV Area VTI:   0.71 cm     Systemic VTI:  0.15 m MV Peak grad:  22.4 mmHg    Systemic Diam: 1.90 cm MV Mean grad:  14.0 mmHg MV Vmax:       2.37 m/s MV Vmean:      180.0 cm/s MV Decel Time: 733 msec MV E velocity: 220.00 cm/s Charlton Haws MD Electronically signed by Charlton Haws MD Signature Date/Time: 05/31/2023/12:27:33 PM    Final    DG Chest Port 1 View Result Date: 05/31/2023 CLINICAL DATA:  Shortness of breath EXAM: PORTABLE CHEST 1 VIEW COMPARISON:  CT chest abdomen and pelvis 05/30/2022 FINDINGS: Cardiomegaly. Hazy opacities projecting over the lower lungs are due at least in part to soft tissue overlap however are suspicious for pneumonia or edema. No definite pleural effusion or pneumothorax. No displaced rib fractures. IMPRESSION: Hazy opacities projecting over the lower lungs are due at least in part  to soft tissue overlap however are suspicious for pneumonia or edema. Electronically Signed   By: Minerva Fester M.D.   On: 05/31/2023 01:55   Assessment and Plan:   Heart failure with mildly reduced ejection fraction Flash pulmonary edema Respiratory failure with hypoxia and hypercapnia Moderate to severe mitral stenosis Patient presented with shortness of breath, SpO2 70% on room air, requiring CPAP via EMS Patient was briefly on BiPAP, currently on 5 L oxygen via [place] and feeling much better  Echo showed: LVEF 55%, no LV RWMA, mild LVH, normal RV function, biatrial enlargement, rheumatic mitral valve with mean gradient 14, peak 22, trivial MR, moderate to severe MS, mild AR, normal IVC Patient given IV Lasix 40 mg x 1 in the ED Weight at admission 256.4 lb  Patient reports feeling significantly better post IV Lasix Continue strict I&O's, daily weights, BMPs Will give another IV Lasix 40 mg, monitor response  Continue nitroglycerin drip for today   Chronic atrial fibrillation with RVR On long-term anticoagulation  Patient has long standing history of A. Fib  Presented in RVR with HR in 130s Currently with HR 80-90s  On warfarin for AC, INR 2.2 Keep K > 4.0, Mag > 2.0  TSH normal CHA2DS2-VASc Score = 7  Continue warfarin per pharmacy dosing Continue to monitor on telemetry   Essential hypertension Most recent  BP 147/110 Patient home meds included: losartan 50 mg BID, Lopressor 25 mg BID -- last doses taken last night  Currently on nitroglycerin drip Attempt to restart home meds possibly tomorrow after weaning off nitroglycerin drip  Elevated troponin 135 > 128 Patient denies any chest pain Not consistent with ACS   Per primary SIRS History of DVT/PE Hyperglycemia   Risk Assessment/Risk Scores:     CHA2DS2-VASc Score = 7   This indicates a 11.2% annual risk of stroke. The patient's score is based upon: CHF History: 1 HTN History: 1 Diabetes History: 0 Stroke  History: 2 Vascular Disease History: 0 Age Score: 2 Gender Score: 1        For questions or updates, please contact [facility] Please consult [email or web address] for contact info under    Signed, Olena Leatherwood, PA-C  05/31/2023 2:20 PM  Personally seen and examined. Agree with above.  78 year old female here with acute respiratory failure flash pulmonary edema in the setting of acute heart  failure with moderately reduced ejection fraction with lupus anticoagulant, prior DVTs on warfarin with prior atrial fibrillation.  Here with her daughter.  Initially was on BiPAP.  Feeling better after Lasix 40 mg IV.  Blood pressure still in the 170 systolic.  O2 levels were in the 70s just prior to admission troponin 135 and flat creatinine 1.23 potassium 4.8 hemoglobin 14.7 BNP 715  Acute heart failure with mildly reduced ejection fraction with flash pulmonary edema and respiratory failure hypoxia and hypercapnia - We will go ahead and give an additional Lasix 40 mg IV now.  She will likely require 40 mg IV twice daily. - Nitroglycerin drip on board.  Hopefully we will be able to wean this off when she gets her home blood pressure medications and she is adequately diuresed.  Minimal headache.  Atrial fibrillation with lupus anticoagulant - On warfarin.  Rate controlled currently.  Came in with RVR.  Elevated troponin is consistent with myocardial injury in the setting of acute heart failure.  Not ACS.  Lungs with mild crackles at bases.  Mildly increased respiratory effort.  Nasal cannula oxygen.  Pleasant.  Has moderate to severe mitral stenosis-would not be a surgical candidate.  Diuresis will help with symptoms.  We will continue to follow.  Donato Schultz, MD

## 2023-05-31 NOTE — Progress Notes (Signed)
 PHARMACY - ANTICOAGULATION CONSULT NOTE  Pharmacy Consult for warfarin Indication: atrial fibrillation and DVT  Allergies  Allergen Reactions   Heparin Hives    Patient attests severe allergy- rash all over, severe itching, unable to take heparin  Can take lovenox   Other Itching and Rash    "Sheets and Linens used by [name]" Mainly while getting the heparin    Patient Measurements: Height: 5\' 5"  (165.1 cm) Weight: 116.3 kg (256 lb 6.3 oz) IBW/kg (Calculated) : 57   Vital Signs: Temp: 96.8 F (36 C) (03/05 0820) Temp Source: Temporal (03/05 0820) BP: 141/121 (03/05 1024) Pulse Rate: 96 (03/05 1024)  Labs: Recent Labs    05/31/23 0020 05/31/23 0113 05/31/23 0251  HGB 14.7  16.3* 15.3*  --   HCT 46.3*  48.0* 45.0  --   PLT 197  --   --   LABPROT 25.3*  --   --   INR 2.3*  --   --   CREATININE 1.23*  --   --   TROPONINIHS 135*  --  128*    Estimated Creatinine Clearance: 48.8 mL/min (A) (by C-G formula based on SCr of 1.23 mg/dL (H)).   Medical History: Past Medical History:  Diagnosis Date   (HFpEF) heart failure with preserved ejection fraction (HCC)    a. 06/2008 Echo: EF 55-60%, mild LVH, triv AI, mild to mod MS, mod to sev dil LA, mildly dil RA.   Acute right MCA stroke (HCC)    a. 06/04/2010 Embolic stroke treated with TPA and thrombectomy with hemorrhagic transformation; Carotids 3/12 negative for ICA stenosis.   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 08/2005 - s/p IVC filter.   CHF (congestive heart failure) (HCC)    Depression    Embolus of femoral artery (HCC)    a. 06/2008 s/p bilateral embolectomies.   History of DVT (deep vein thrombosis)    a. s/p IVC filter.   HIT (heparin-induced thrombocytopenia) (HCC)    HTN (hypertension)    Lupus anticoagulant disorder (HCC)    Mitral stenosis    moderate-severe 09/04/20 echo   Obesity, unspecified    Permanent atrial fibrillation (HCC)     a. On chronic Coumadin - INRs followed by PCP; b. CHA2DS2VASc = 5.   Popliteal artery embolism, right (HCC)    a. 01/2017 in the setting of subRx INR-->s/p embolectomy.     Assessment: 34 yoF with PHM of atrial fibrillaiton and PE on warfarin PTA (admit INR 2.3) last dose 3/4. CBC stable. Pharmacy consulted to manage warfarin. Will continue home regimen for now since INR at goal.   Home warfarin: 3mg  Mon, Wed, Fri, Sun and 4.5mg  all other days   Goal of Therapy:  Heparin level 0.3-0.7 units/ml Monitor platelets by anticoagulation protocol: Yes   Plan:  Continue warfarin per home regimen Monitor INR and CBC  Ruben Im, PharmD Clinical Pharmacist 05/31/2023 10:54 AM Please check AMION for all [facility] Pharmacy numbers

## 2023-05-31 NOTE — Hospital Course (Signed)
 Mrs. Snell was admitted to the hospital with the working diagnosis of acute on chronic heart failure   78 yo female with the past medical history of hypertension, heart failure, atrial fibrillation, mitral stenosis, history of CVA, obesity and lupus hyper-coagulant who presented with worsening dyspnea. She had an acute and severe episode of dyspnea that woke her up from her sleep. EMS was called and she was found with 02 saturation of 70% on room air, and in respiratory distress. She was placed on cpap and transported to the ED On her initial physical examination her HR was 140, RR 25 to 39, blood pressure 164/57 on Bipap. Lungs with decreased breath sounds and positive rales. Heart with S1 and S2 present, irregularly irregular, with no gallops or rubs, abdomen with no distention and no lower extremity edema   Na 137, K 4,8 Cl 98, bicarbonate 22, glucose 295, bun 17 cr 1,23  AST 26, ALT 19  BNP 715.1 High sensitive troponin 135 and 128  Lactic acid 2,0 and 2,6  Procalcitonin 12.4   Wbc 11,5 hgb 14,7 plt 197  INR 2,4   Chest radiograph with hypoinflation, positive cardiomegaly and bilateral hilar vascular congestion, bilateral interstitial infiltrates at lower lobes, more predominant on the right side.   Patient was placed on furosemide for diuresis  Antibiotic therapy for possible pneumonia

## 2023-05-31 NOTE — H&P (Addendum)
 History and Physical    Patient: [name] WUJ:[id number] DOB: 1945-08-05 DOA: 05/31/2023 DOS: the patient was seen and examined on 05/31/2023 PCP: Marden Noble, MD (Inactive)  Patient coming from: Home  Chief Complaint:  Chief Complaint  Patient presents with   Respiratory Distress   HPI: [name] is a 78 y.o. female with medical history significant of hypertension, heart failure with mildly reduced EF, atrial fibrillation on chronic anticoagulation, mitral stenosis, CVA, history of DVT/PE, HIT, lupus anticoagulant disorder, depression, and morbid obesity presents with vomiting and shortness of breath. She is accompanied by her daughter.  She experienced sudden onset vomiting and shortness of breath after waking up from a nap. No chest pain, cough, lightheadedness, dizziness, or fever. This is an unusual occurrence for her and has not happened before.  She has a history of atrial fibrillation and moderate to severe mitral stenosis. She is not on oxygen therapy at baseline or use inhalers. No recent leg swelling and has not been around anyone sick recently, although her sister was sick but she has not been in contact with her.  She is currently urinating without any burning sensation.  She uses a walker for mobility.  EMS found the patient have O2 saturations as low as 70% room air patient was placed on CPAP with improvement of O2 saturations  Plan admission into the emergency department patient was noted to be afebrile with heart rates elevated into the 140s, respirations 25-39, blood pressures elevated up to 164/57, and O2 saturations currently maintained after being switched over to BiPAP.  Labs significant for WBC 11.5, CO2 22, BUN 17, creatinine 1.23, glucose 295, anion gap 17, INR 2.3, BNP 715.1, high-sensitivity troponin 135 ->128.  Chest x-ray noted hazy opacities projecting over the lower lungs suspicious for pneumonia or edema.  Arterial blood gas noted pH 7.249, pCO2  57.7, and pO2 356.  Patient was started on a nitroglycerin drip.  Review of Systems: As mentioned in the history of present illness. All other systems reviewed and are negative. Past Medical History:  Diagnosis Date   (HFpEF) heart failure with preserved ejection fraction (HCC)    a. 06/2008 Echo: EF 55-60%, mild LVH, triv AI, mild to mod MS, mod to sev dil LA, mildly dil RA.   Acute right MCA stroke (HCC)    a. 06/04/2010 Embolic stroke treated with TPA and thrombectomy with hemorrhagic transformation; Carotids 3/12 negative for ICA stenosis.   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 08/2005 - s/p IVC filter.   CHF (congestive heart failure) (HCC)    Depression    Embolus of femoral artery (HCC)    a. 06/2008 s/p bilateral embolectomies.   History of DVT (deep vein thrombosis)    a. s/p IVC filter.   HIT (heparin-induced thrombocytopenia) (HCC)    HTN (hypertension)    Lupus anticoagulant disorder (HCC)    Mitral stenosis    moderate-severe 09/04/20 echo   Obesity, unspecified    Permanent atrial fibrillation (HCC)    a. On chronic Coumadin - INRs followed by PCP; b. CHA2DS2VASc = 5.   Popliteal artery embolism, right (HCC)    a. 01/2017 in the setting of subRx INR-->s/p embolectomy.   Past Surgical History:  Procedure Laterality Date   APPLICATION OF WOUND VAC  12/09/2020   Procedure: APPLICATION OF WOUND VAC;  Surgeon: Nadara Mustard, MD;  Location: [facility] OR;  Service: Orthopedics;;   distal radius fracture. (12,/16/2010)     EMBOLECTOMY Right 02/15/2017   Procedure: EMBOLECTOMY RIGHT LEG;  Surgeon: Sherren Kerns, MD;  Location: [facility] OR;  Service: Vascular;  Laterality: Right;   EYE SURGERY Bilateral 2021   cataracts removed   I & D EXTREMITY Right 03/05/2017   Procedure: IRRIGATION AND DEBRIDEMENT RIGHT LEG HEMATOMA EVACUATION;  Surgeon: Sherren Kerns, MD;  Location: [facility] OR;  Service: Vascular;  Laterality: Right;   IVC  Placement 08/30/2005     LIPOMA EXCISION Left 12/09/2020   Procedure: EXCISION LIPOMA LEFT THIGH;  Surgeon: Nadara Mustard, MD;  Location: [facility] OR;  Service: Orthopedics;  Laterality: Left;   Open reduction and internal fixation of left intra-articular     Social History:  reports that she has never smoked. She has never used smokeless tobacco. She reports that she does not drink alcohol and does not use drugs.  Allergies  Allergen Reactions   Heparin Hives    Patient attests severe allergy- rash all over, severe itching, unable to take heparin  Can take lovenox   Other Itching and Rash    "Sheets and Linens used by [name]" Mainly while getting the heparin    Family History  Problem Relation Age of Onset   Coronary artery disease Other    Diabetes Other     Prior to Admission medications   Medication Sig Start Date End Date Taking? Authorizing Provider  acetaminophen (TYLENOL) 500 MG tablet Take 2 tablets (1,000 mg total) by mouth 3 (three) times daily. Patient taking differently: Take 1,000 mg by mouth every 8 (eight) hours as needed for mild pain (pain score 1-3). 06/03/22  Yes Dahal, Melina Schools, MD  Calcium Carb-Cholecalciferol (CALCIUM 600 + D PO) Take 1 tablet by mouth 2 (two) times daily.   Yes [provider]  Cholecalciferol (VITAMIN D) 50 MCG (2000 UT) tablet Take 2,000 Units by mouth daily.   Yes [provider]  FLUoxetine (PROZAC) 20 MG capsule Take 20 mg by mouth daily as needed (for anxiety). 04/12/17  Yes [provider]  furosemide (LASIX) 40 MG tablet Take 1 tablet (40 mg total) by mouth daily as needed for fluid. Patient taking differently: Take 40 mg by mouth daily. 06/03/22  Yes Dahal, Melina Schools, MD  losartan (COZAAR) 50 MG tablet Take 50 mg by mouth 2 (two) times daily. 05/08/23  Yes [provider]  metoprolol tartrate (LOPRESSOR) 25 MG tablet Take 1 tablet (25 mg total) by mouth 2 (two) times daily. 02/03/23  Yes Hochrein, Fayrene Fearing, MD   potassium chloride (K-DUR) 10 MEQ tablet TAKE 1 TABLET BY MOUTH ONCE DAILY Patient taking differently: Take 10 mEq by mouth daily. 04/23/18  Yes Abelino Derrick, PA-C  warfarin (COUMADIN) 3 MG tablet Take 1.5-2 tablets (4.5-6 mg total) by mouth See admin instructions. TAKE 2 TABLETS BY MOUTH DAILY EXCEPT 1 AND 1/2 TABLETS ON TUESDAY, THURSDAY, AND SATURDAY OR AS DIRECTED BY COUMADIN CLINIC; NEEDS INR CHECK CALL OFFICE Patient taking differently: Take 3-4.5 mg by mouth See admin instructions. Take 1 tablet by mouth every Mon, Wed, Fri and take 1 and 1/2 tablets all other days OR AS DIRECTED BY COUMADIN CLINIC; NEEDS INR CHECK CALL OFFICE 06/05/22   Lorin Glass, MD    Physical Exam: Vitals:   05/31/23 0615 05/31/23 0630 05/31/23 0645 05/31/23 0700  BP: (!) 145/76 (!) 142/82 136/73 (!) 141/111  Pulse: 99 (!) 102 100 (!) 111  Resp: (!) 29 Marland Kitchen)  26 (!) 27 (!) 30  Temp:      TempSrc:      SpO2: 100% 100% 100% 100%  Weight:      Height:       Constitutional: Elderly female who appears to be in some respiratory distress Eyes: PERRL, lids and conjunctivae normal ENMT: Mucous membranes are moist.   Normal dentition.  Neck: normal, supple,   Respiratory: Decreased [place] aeration with crackles appreciated.  Patient currently on 2 L nasal cannula oxygen with O2 saturations maintained. Cardiovascular: Regular rate and rhythm, no murmurs / rubs / gallops. No extremity edema.    Abdomen: no tenderness, no masses palpated.   Bowel sounds positive.  Musculoskeletal: no clubbing / cyanosis. No joint deformity upper and lower extremities. Good ROM, no contractures. Normal muscle tone.  Skin: no rashes, lesions, ulcers. No induration Neurologic: CN 2-12 grossly intact. Strength 5/5 in all 4.  Psychiatric: Normal judgment and insight. Alert and oriented x 3. Normal mood.   Data Reviewed:  EKG revealed atrial fibrillation with RVR at 132 bpm.  Reviewed labs, imaging, and pertinent records as  documented. Assessment and Plan:  Respiratory failure with hypoxia and hypercapnia Pulmonary edema Heart failure with mildly reduced ejection fraction Moderate to severe mitral stenosis Acute.  Patient presented with acute respiratory distress and was found to have O2 saturations as low as 70% on room air with EMS.  She had been placed on BiPAP temporarily -Admit to a progressive bed -Continuous pulse oximetry with oxygen to maintain O2 saturation greater than 92% -BiPAP, wean off as tolerated -N.p.o. and advance as tolerated -Heart failure order set utilized -Strict I&Os -Daily weights -Lasix 40 mg IV BID.  Reassess need for further IV diuresis in a.m. -Check echocardiogram -Albuterol nebs as needed -Cardiology consulted we will follow-up for any further recommendations  SIRS Acute.  WBC elevated at 11.5. -Check blood cultures, lactic acid, and procalcitonin -Check respiratory virus panel -Start empiric antibiotics of Rocephin and doxycycline in case of underlying infection. -Recheck CBC tomorrow morning  Elevated troponin Acute.  High-sensitivity troponins 135 ->128. -Follow-up echocardiogram -Continue to monitor  Chronic atrial fibrillation with RVR on chronic anticoagulation Patient was noted to be in A-fib with RVR with heart rates into the 130s on admission.  INR therapeutic at 2.2. -Continue Coumadin per pharmacy -Goal potassium at least 4 and magnesium at least 2, replace electrolytes as needed -Check TSH  Essential hypertension Blood pressure are elevated up to 176/108 -Continue losartan and metoprolol  History of DVT/PE Lupus anticoagulant disorder Patient with prior history of pulmonary embolism and DVT s/p IVC filter placement as well as prior embolectomy.  -Continue anticoagulation  Hyperglycemia Acute.  Patient with a history of prediabetes with last hemoglobin A1c noted to be 6 back in 2022, but presents with glucose elevated up to 295 with CO2 22, and  anion gap 17. -Hypoglycemic protocols -Add-on hemoglobin A1c -CBGs with sensitive SSI -Adjust regimen as needed   Morbid obesity BMI 42.67 kg/m  DVT prophylaxis: Coumadin Advance Care Planning:   Code Status: Full Code   Consults: Cardiology  Family Communication: Daughter updated at bedside  Severity of Illness: The appropriate patient status for this patient is INPATIENT. Inpatient status is judged to be reasonable and necessary in order to provide the required intensity of service to ensure the patient's safety. The patient's presenting symptoms, physical exam findings, and initial radiographic and laboratory data in the context of their chronic comorbidities is felt to place them at high risk  for further clinical deterioration. Furthermore, it is not anticipated that the patient will be medically stable for discharge from the hospital within 2 midnights of admission.   * I certify that at the point of admission it is my clinical judgment that the patient will require inpatient hospital care spanning beyond 2 midnights from the point of admission due to high intensity of service, high risk for further deterioration and high frequency of surveillance required.*  Author: Clydie Braun, MD 05/31/2023 7:20 AM  For on call review [email or web address].

## 2023-05-31 NOTE — Progress Notes (Signed)
  Echocardiogram 2D Echocardiogram has been performed.  [name] [name] 05/31/2023, 12:27 PM

## 2023-05-31 NOTE — ED Triage Notes (Addendum)
 Pt in from home on CPAP via [facility] with respiratory distress - callout for sob per EMS. When they arrived, pt had labored breathing and sats of 70%. Family states pt has recently been sob this evening and coughing up pink-tinged sputum. Hx of CHF and blood clotting disorder. GCS of 8 on ED arrival.

## 2023-05-31 NOTE — ED Provider Notes (Signed)
 [facility] DEPT [facility] Emergency Department Provider Note MRN:  [id number]  Arrival date & time: 05/31/23     Chief Complaint   Respiratory Distress   History of Present Illness   [name] is a 78 y.o. year-old female with a history of CHF, PE presenting to the ED with chief complaint of respiratory distress.  Normal day according to patient's daughter.  Patient called daughter this evening because she was feeling unwell and having nausea vomiting.  Patient was concerned that it was the dinner she ate making her feel nauseated.  Had some pink-reddish vomit by concerned daughter that she was having bleeding.  Most notable was patient's significant shortness of breath when daughter checked on her.  Desaturation into the 70s with EMS, respiratory distress requiring CPAP on the way here.  Review of Systems  A thorough review of systems was obtained and all systems are negative except as noted in the HPI and PMH.   Patient's Health History    Past Medical History:  Diagnosis Date   (HFpEF) heart failure with preserved ejection fraction (HCC)    a. 06/2008 Echo: EF 55-60%, mild LVH, triv AI, mild to mod MS, mod to sev dil LA, mildly dil RA.   Acute right MCA stroke (HCC)    a. 06/04/2010 Embolic stroke treated with TPA and thrombectomy with hemorrhagic transformation; Carotids 3/12 negative for ICA stenosis.   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 08/2005 - s/p IVC filter.   CHF (congestive heart failure) (HCC)    Depression    Embolus of femoral artery (HCC)    a. 06/2008 s/p bilateral embolectomies.   History of DVT (deep vein thrombosis)    a. s/p IVC filter.   HIT (heparin-induced thrombocytopenia) (HCC)    HTN (hypertension)    Lupus anticoagulant disorder (HCC)    Mitral stenosis    moderate-severe 09/04/20 echo   Obesity, unspecified    Permanent atrial fibrillation (HCC)    a. On chronic Coumadin -  INRs followed by PCP; b. CHA2DS2VASc = 5.   Popliteal artery embolism, right (HCC)    a. 01/2017 in the setting of subRx INR-->s/p embolectomy.    Past Surgical History:  Procedure Laterality Date   APPLICATION OF WOUND VAC  12/09/2020   Procedure: APPLICATION OF WOUND VAC;  Surgeon: Nadara Mustard, MD;  Location: [facility] OR;  Service: Orthopedics;;   distal radius fracture. (12,/16/2010)     EMBOLECTOMY Right 02/15/2017   Procedure: EMBOLECTOMY RIGHT LEG;  Surgeon: Sherren Kerns, MD;  Location: [facility] OR;  Service: Vascular;  Laterality: Right;   EYE SURGERY Bilateral 2021   cataracts removed   I & D EXTREMITY Right 03/05/2017   Procedure: IRRIGATION AND DEBRIDEMENT RIGHT LEG HEMATOMA EVACUATION;  Surgeon: Sherren Kerns, MD;  Location: [facility] OR;  Service: Vascular;  Laterality: Right;   IVC Placement 08/30/2005     LIPOMA EXCISION Left 12/09/2020   Procedure: EXCISION LIPOMA LEFT THIGH;  Surgeon: Nadara Mustard, MD;  Location: [facility] OR;  Service: Orthopedics;  Laterality: Left;   Open reduction and internal fixation of left intra-articular      Family History  Problem Relation Age of Onset   Coronary artery disease Other    Diabetes Other     Social History   Socioeconomic History   Marital status: Single    Spouse name: Not on file  Number of children: Not on file   Years of education: Not on file   Highest education level: Not on file  Occupational History   Not on file  Tobacco Use   Smoking status: Never   Smokeless tobacco: Never  Vaping Use   Vaping status: Never Used  Substance and Sexual Activity   Alcohol use: No   Drug use: No   Sexual activity: Not Currently  Other Topics Concern   Not on file  Social History Narrative    Lives alone.  She works at a Museum/gallery curator.   Daughter in area to assist if needed.  One-level home, 5-6 steps to   entry.  Functional history prior to admission was independent,   functional status upon admission to rehab services was  +2, total assist   bed mobility,+2 total assist transfers, ambulation not tested, moderate   assist upper body, total assist lower body, activities of daily living.       Social Drivers of Corporate investment banker Strain: Not on file  Food Insecurity: No Food Insecurity (05/31/2022)   Hunger Vital Sign    Worried About Running Out of Food in the Last Year: Never true    Ran Out of Food in the Last Year: Never true  Transportation Needs: No Transportation Needs (05/31/2022)   PRAPARE - Administrator, Civil Service (Medical): No    Lack of Transportation (Non-Medical): No  Physical Activity: Not on file  Stress: Not on file  Social Connections: Not on file  Intimate Partner Violence: Not At Risk (05/31/2022)   Humiliation, Afraid, Rape, and Kick questionnaire    Fear of Current or Ex-Partner: No    Emotionally Abused: No    Physically Abused: No    Sexually Abused: No     Physical Exam   Vitals:   05/31/23 0645 05/31/23 0700  BP: 136/73 (!) 141/111  Pulse: 100 (!) 111  Resp: (!) 27 (!) 30  Temp:    SpO2: 100% 100%    CONSTITUTIONAL: Ill-appearing, NAD NEURO/PSYCH: Somnolent but wakes easily and answers questions, moves all extremities EYES:  eyes equal and reactive ENT/NECK:  no LAD, moderate JVD CARDIO: Tachycardic rate, well-perfused, normal S1 and S2 PULM: Diminished breath sounds, rhonchi, tachypneic GI/GU:  non-distended, non-tender MSK/SPINE:  No gross deformities, no edema SKIN:  no rash, atraumatic   *Additional and/or pertinent findings included in MDM below  Diagnostic and Interventional Summary    EKG Interpretation Date/Time:  Wednesday May 31 2023 00:19:09 EST Ventricular Rate:  132 PR Interval:  143 QRS Duration:  96 QT Interval:  314 QTC Calculation: 466 R Axis:   199  Text Interpretation: Atrial fibrillation with rapid ventricular response Right axis deviation Low voltage, precordial leads Probable anteroseptal infarct, old  Confirmed by Kennis Carina [phone]) on 05/31/2023 1:04:20 AM       Labs Reviewed  CBC - Abnormal; Notable for the following components:      Result Value   WBC 11.5 (*)    HCT 46.3 (*)    MCV 101.5 (*)    All other components within normal limits  COMPREHENSIVE METABOLIC PANEL - Abnormal; Notable for the following components:   Glucose, Bld 295 (*)    Creatinine, Ser 1.23 (*)    Calcium 8.8 (*)    Albumin 3.4 (*)    Total Bilirubin 1.3 (*)    GFR, Estimated 45 (*)    Anion gap 17 (*)    All other  components within normal limits  BRAIN NATRIURETIC PEPTIDE - Abnormal; Notable for the following components:   B Natriuretic Peptide 715.1 (*)    All other components within normal limits  PROTIME-INR - Abnormal; Notable for the following components:   Prothrombin Time 25.3 (*)    INR 2.3 (*)    All other components within normal limits  I-STAT VENOUS BLOOD GAS, ED - Abnormal; Notable for the following components:   pH, Ven 7.211 (*)    pO2, Ven 64 (*)    Acid-base deficit 6.0 (*)    Calcium, Ion 1.06 (*)    HCT 48.0 (*)    Hemoglobin 16.3 (*)    All other components within normal limits  I-STAT ARTERIAL BLOOD GAS, ED - Abnormal; Notable for the following components:   pH, Arterial 7.249 (*)    pCO2 arterial 57.7 (*)    pO2, Arterial 356 (*)    Acid-base deficit 3.0 (*)    Hemoglobin 15.3 (*)    All other components within normal limits  TROPONIN I (HIGH SENSITIVITY) - Abnormal; Notable for the following components:   Troponin I (High Sensitivity) 135 (*)    All other components within normal limits  TROPONIN I (HIGH SENSITIVITY) - Abnormal; Notable for the following components:   Troponin I (High Sensitivity) 128 (*)    All other components within normal limits  BLOOD GAS, ARTERIAL  TYPE AND SCREEN    DG Chest Port 1 View  Final Result      Medications  nitroGLYCERIN 50 mg in dextrose 5 % 250 mL (0.2 mg/mL) infusion (5 mcg/min Intravenous Rate/Dose Change 05/31/23 0214)   furosemide (LASIX) injection 40 mg (has no administration in time range)     Procedures  /  Critical Care .Critical Care  Performed by: Sabas Sous, MD Authorized by: Sabas Sous, MD   Critical care provider statement:    Critical care time (minutes):  80   Critical care was necessary to treat or prevent imminent or life-threatening deterioration of the following conditions:  Respiratory failure   Critical care was time spent personally by me on the following activities:  Development of treatment plan with patient or surrogate, discussions with consultants, evaluation of patient's response to treatment, examination of patient, ordering and review of laboratory studies, ordering and review of radiographic studies, ordering and performing treatments and interventions, pulse oximetry, re-evaluation of patient's condition and review of old charts   ED Course and Medical Decision Making  Initial Impression and Ddx Patient presenting with acute shortness of breath, history of CHF, history of PE, anticoagulated and also has an IVC filter.  Clinical picture seems most suspicious for flash pulmonary edema.  Presenting hypertensive, has JVD, has some lower extremity edema.  Chest x-ray seems to support this on my bedside read.  Patient was becoming more somnolent with EMS concerning for impending respiratory failure, hypercarbia.  Will do a brief trial of BiPAP here to see if we can avoid intubation but this is a consideration.  Overall seems to be improving with BiPAP here.  Also providing nitroglycerin drip.  Past medical/surgical history that increases complexity of ED encounter: CHF  Interpretation of Diagnostics I personally reviewed the EKG and my interpretation is as follows: A-fib with RVR  Labs reveal respiratory acidosis, mildly elevated troponin  Patient Reassessment and Ultimate Disposition/Management     Patient continues to improve on BiPAP, accepted for admission to  stepdown unit.  Patient management required discussion with the following services  or consulting groups:  Hospitalist Service  Complexity of Problems Addressed Acute illness or injury that poses threat of life of bodily function  Additional Data Reviewed and Analyzed Further history obtained from: Further history from spouse/family member  Additional Factors Impacting ED Encounter Risk Consideration of hospitalization  Elmer Sow. Pilar Plate, MD [facility] Emergency Medicine [facility] [email or web address]  Final Clinical Impressions(s) / ED Diagnoses     ICD-10-CM   1. Acute respiratory failure with hypoxia and hypercapnia (HCC)  J96.01    J96.02       ED Discharge Orders     None        Discharge Instructions Discussed with and Provided to Patient:   Discharge Instructions   None      Sabas Sous, MD 05/31/23 [phone]

## 2023-06-01 ENCOUNTER — Encounter (HOSPITAL_COMMUNITY): Payer: Self-pay | Admitting: Internal Medicine

## 2023-06-01 DIAGNOSIS — I10 Essential (primary) hypertension: Secondary | ICD-10-CM | POA: Diagnosis not present

## 2023-06-01 DIAGNOSIS — E66813 Obesity, class 3: Secondary | ICD-10-CM

## 2023-06-01 DIAGNOSIS — N1831 Chronic kidney disease, stage 3a: Secondary | ICD-10-CM

## 2023-06-01 DIAGNOSIS — R7303 Prediabetes: Secondary | ICD-10-CM

## 2023-06-01 DIAGNOSIS — I482 Chronic atrial fibrillation, unspecified: Secondary | ICD-10-CM | POA: Diagnosis not present

## 2023-06-01 DIAGNOSIS — I5033 Acute on chronic diastolic (congestive) heart failure: Secondary | ICD-10-CM

## 2023-06-01 DIAGNOSIS — Z862 Personal history of diseases of the blood and blood-forming organs and certain disorders involving the immune mechanism: Secondary | ICD-10-CM | POA: Diagnosis not present

## 2023-06-01 DIAGNOSIS — I5022 Chronic systolic (congestive) heart failure: Secondary | ICD-10-CM | POA: Diagnosis not present

## 2023-06-01 LAB — CBC
HCT: 43.5 % (ref 36.0–46.0)
Hemoglobin: 14.1 g/dL (ref 12.0–15.0)
MCH: 32 pg (ref 26.0–34.0)
MCHC: 32.4 g/dL (ref 30.0–36.0)
MCV: 98.9 fL (ref 80.0–100.0)
Platelets: 146 10*3/uL — ABNORMAL LOW (ref 150–400)
RBC: 4.4 MIL/uL (ref 3.87–5.11)
RDW: 13.3 % (ref 11.5–15.5)
WBC: 18 10*3/uL — ABNORMAL HIGH (ref 4.0–10.5)
nRBC: 0 % (ref 0.0–0.2)

## 2023-06-01 LAB — CBG MONITORING, ED
Glucose-Capillary: 139 mg/dL — ABNORMAL HIGH (ref 70–99)
Glucose-Capillary: 131 mg/dL — ABNORMAL HIGH (ref 70–99)

## 2023-06-01 LAB — BASIC METABOLIC PANEL
Anion gap: 12 (ref 5–15)
BUN: 25 mg/dL — ABNORMAL HIGH (ref 8–23)
CO2: 28 mmol/L (ref 22–32)
Calcium: 8.9 mg/dL (ref 8.9–10.3)
Chloride: 97 mmol/L — ABNORMAL LOW (ref 98–111)
Creatinine, Ser: 1.17 mg/dL — ABNORMAL HIGH (ref 0.44–1.00)
GFR, Estimated: 48 mL/min — ABNORMAL LOW (ref >60)
Glucose, Bld: 131 mg/dL — ABNORMAL HIGH (ref 70–99)
Potassium: 3.8 mmol/L (ref 3.5–5.1)
Sodium: 137 mmol/L (ref 135–145)

## 2023-06-01 LAB — PROTIME-INR
INR: 2.4 — ABNORMAL HIGH (ref 0.8–1.2)
Prothrombin Time: 26.2 s — ABNORMAL HIGH (ref 11.4–15.2)

## 2023-06-01 LAB — GLUCOSE, CAPILLARY
Glucose-Capillary: 114 mg/dL — ABNORMAL HIGH (ref 70–99)
Glucose-Capillary: 119 mg/dL — ABNORMAL HIGH (ref 70–99)
Glucose-Capillary: 151 mg/dL — ABNORMAL HIGH (ref 70–99)

## 2023-06-01 MED ORDER — POTASSIUM CHLORIDE CRYS ER 20 MEQ PO TBCR
40.0000 meq | EXTENDED_RELEASE_TABLET | Freq: Once | ORAL | Status: AC
  Administered 2023-06-01: 40 meq via ORAL
  Filled 2023-06-01: qty 2

## 2023-06-01 MED ORDER — FUROSEMIDE 10 MG/ML IJ SOLN
40.0000 mg | Freq: Two times a day (BID) | INTRAMUSCULAR | Status: DC
  Administered 2023-06-01 – 2023-06-04 (×7): 40 mg via INTRAVENOUS
  Filled 2023-06-01 (×7): qty 4

## 2023-06-01 MED ORDER — SACUBITRIL-VALSARTAN 49-51 MG PO TABS
1.0000 | ORAL_TABLET | Freq: Two times a day (BID) | ORAL | Status: DC
  Administered 2023-06-01 – 2023-06-06 (×12): 1 via ORAL
  Filled 2023-06-01 (×12): qty 1

## 2023-06-01 NOTE — Assessment & Plan Note (Signed)
Continue blood pressure control with entresto and metoprolol. ? ?

## 2023-06-01 NOTE — Progress Notes (Signed)
 Heart Failure Navigator Progress Note  Assessed for Heart & Vascular TOC clinic readiness.  Patient does not meet criteria due to EF 55%, Has scheduled [facility] appointment 06/09/23, No HF TOC per Dr. Ella Jubilee..   Navigator will sign off at this time.   Rhae Hammock, BSN, Scientist, clinical (histocompatibility and immunogenetics) Only

## 2023-06-01 NOTE — Assessment & Plan Note (Addendum)
 Echocardiogram with preserved LV systolic function with EF 55%, mild LVH. RV systolic function is preserved. LA with severe dilatation, RA with moderate dilatation, moderate to severe mitral stenosis , mild TR.   Urine output is 1000 ml since admission  Systolic blood pressure 140 mmHg range.   Plan to continue diuresis with IV furosemide 40 mg IV bid  Continue with metoprolol and entresto.   Acute hypoxemic respiratory failure due to acute cardiogenic pulmonary edema Now off bipap Continue diuresis and supplemental 02 per [place] Currently 02 saturation is 95% on 4 L/min per [place].  Positive infiltrate at lower lobes more predominant at the right side, will continue antibiotic therapy for now for possible community acquired pneumonia.  Plan to repeat chest radiograph in am after diuresis to follow up on infiltrate.   Elevated troponin due to heart failure exacerbation, ruled out acute coronary syndrome.

## 2023-06-01 NOTE — Plan of Care (Signed)

## 2023-06-01 NOTE — Assessment & Plan Note (Signed)
 Valvular atrial fibrillation Continue rate control with metoprolol and anticoagulation with warfarin  INR is 2.4  Continue telemetry monitoring.

## 2023-06-01 NOTE — Progress Notes (Signed)
 Pt arrived to [phone]

## 2023-06-01 NOTE — Assessment & Plan Note (Signed)
Calculated BMI is 45.8

## 2023-06-01 NOTE — Assessment & Plan Note (Signed)
 Hgb A1c 5,8  Hyperglycemia   Plan to continue insulin sliding scale for glucose cover and monitoring.

## 2023-06-01 NOTE — Assessment & Plan Note (Addendum)
 History of DVT and PE Continue anticoagulation with warfarin

## 2023-06-01 NOTE — Progress Notes (Signed)
   Patient Name: AMATULLAH CHRISTY Date of Encounter: 06/01/2023 [facility] Cardiologist: Rollene Rotunda, MD   Interval Summary  .    Comfortable.  Was sleeping in bed.  Dentures noted.  Woke up without difficulty.  States that she is feeling better.  Blood pressures have come down slightly.  Vital Signs .    Vitals:   06/01/23 0322 06/01/23 0530 06/01/23 0700 06/01/23 0725  BP:  (!) 147/97 (!) 153/99   Pulse:  84 81   Resp:  (!) 25 (!) 25   Temp: (!) 97.1 F (36.2 C)   98.3 F (36.8 C)  TempSrc: Tympanic   Oral  SpO2:  98% 99%   Weight:      Height:        Intake/Output Summary (Last 24 hours) at 06/01/2023 0817 Last data filed at 05/31/2023 1447 Gross per 24 hour  Intake --  Output 950 ml  Net -950 ml      05/31/2023   12:37 AM 06/03/2022    5:00 AM 06/02/2022    5:00 AM  Last 3 Weights  Weight (lbs) 256 lb 6.3 oz 256 lb 6.3 oz 253 lb 8.5 oz  Weight (kg) 116.3 kg 116.3 kg 115 kg      Telemetry/ECG    Atrial fibrillation heart rate in the 80s- Personally Reviewed  Physical Exam .   GEN: No acute distress.   Neck: No JVD Cardiac: Irregularly irregular, soft diastolic murmur,no  rubs, or gallops.  Respiratory: Minimal crackles bilaterally. GI: Soft, nontender, non-distended  MS: No edema  Assessment & Plan .     78 year old female with moderate to severe mitral stenosis with acute on chronic systolic heart failure with moderately reduced ejection fraction in the 40 to 45% range with history of stroke DVT Lupus anticoagulant hypercoagulable state on chronic warfarin with persistent atrial fibrillation.  Came in flash pulmonary edema hypoxic respiratory failure.  Echocardiogram with EF 55% moderate to severe mitral stenosis  Acute on chronic systolic heart failure/ flash pulmonary edema/respiratory failure with hypoxia - Originally was placed on BiPAP, IV Lasix 40 mg was administered.  She was able to wean off of the BiPAP to nasal cannula oxygen.  Was  originally satting 70% on room air. -We will go ahead and proceed with Lasix 40 mg IV twice daily.  I ordered.  I will also go ahead and continue with potassium supplementation.  Last potassium 3.8. -Try to wean off nitroglycerin drip -Creatinine 1.17 -We will stop losartan 50 mg and start Entresto 49/51 twice daily This will also help with blood pressure. -As we continue to diurese, her blood pressure will follow. -Okay to continue with metoprolol 25 twice daily. -Consider spironolactone 12.5 mg once a day in the future, possibly SGLT2 inhibitor as well.  Moderate to severe mitral stenosis - Not a surgical candidate.  Continue with IV diuresis.  Monitor creatinine.  Persistent atrial fibrillation - Noted on telemetry, rate controlled.  Continue with metoprolol - Continue with warfarin.  Lupus anticoagulant - Hypercoagulable state, continue with warfarin.  Prior history of DVT    For questions or updates, please contact [facility] Please consult [email or web address] for contact info under        Signed, Donato Schultz, MD

## 2023-06-01 NOTE — Progress Notes (Addendum)
 Progress Note   Patient: [name] VWU:[id number] DOB: 1945-07-11 DOA: 05/31/2023     1 DOS: the patient was seen and examined on 06/01/2023   Brief hospital course: [name] was admitted to the hospital with the working diagnosis of acute on chronic heart failure   78 yo female with the past medical history of hypertension, heart failure, atrial fibrillation, mitral stenosis, history of CVA, obesity and lupus hyper-coagulant who presented with worsening dyspnea. She had an acute and severe episode of dyspnea that woke her up from her sleep. EMS was called and she was found with 02 saturation of 70% on room air, and in respiratory distress. She was placed on cpap and transported to the ED On her initial physical examination her HR was 140, RR 25 to 39, blood pressure 164/57 on Bipap. Lungs with decreased breath sounds and positive rales. Heart with S1 and S2 present, irregularly irregular, with no gallops or rubs, abdomen with no distention and no lower extremity edema   Na 137, K 4,8 Cl 98, bicarbonate 22, glucose 295, bun 17 cr 1,23  AST 26, ALT 19  BNP 715.1 High sensitive troponin 135 and 128  Lactic acid 2,0 and 2,6  Procalcitonin 12.4   Wbc 11,5 hgb 14,7 plt 197  INR 2,4   Chest radiograph with hypoinflation, positive cardiomegaly and bilateral hilar vascular congestion, bilateral interstitial infiltrates at lower lobes, more predominant on the right side.   Patient was placed on furosemide for diuresis  Antibiotic therapy for possible pneumonia    Assessment and Plan: * Acute on chronic diastolic CHF (congestive heart failure) (HCC) Echocardiogram with preserved LV systolic function with EF 55%, mild LVH. RV systolic function is preserved. LA with severe dilatation, RA with moderate dilatation, moderate to severe mitral stenosis , mild TR.   Urine output is 1000 ml since admission  Systolic blood pressure 140 mmHg range.   Plan to continue diuresis with IV furosemide  40 mg IV bid  Continue with metoprolol and entresto.   Acute hypoxemic respiratory failure due to acute cardiogenic pulmonary edema Now off bipap Continue diuresis and supplemental 02 per [place] Currently 02 saturation is 95% on 4 L/min per [place].  Positive infiltrate at lower lobes more predominant at the right side, will continue antibiotic therapy for now for possible community acquired pneumonia.  Plan to repeat chest radiograph in am after diuresis to follow up on infiltrate.   Elevated troponin due to heart failure exacerbation, ruled out acute coronary syndrome.   Chronic atrial fibrillation with RVR (HCC) Valvular atrial fibrillation Continue rate control with metoprolol and anticoagulation with warfarin  INR is 2.4  Continue telemetry monitoring.   Essential hypertension Continue blood pressure control with entresto and metoprolol.   CKD stage 3a, GFR 45-59 ml/min (HCC) Renal function stable with serum cr at 1,17 with K at 3,8 and serum bicarbonate at 28  Na 137   Follow up renal function in am.  Continue diuresis with furosemide Avoid hypotension and nephrotoxic medications.   Pre-diabetes Hgb A1c 5,8  Hyperglycemia   Plan to continue insulin sliding scale for glucose cover and monitoring.   History of lupus anticoagulant disorder History of DVT and PE Continue anticoagulation with warfarin   Obesity, class 3 Calculated BMI is 42.6    Subjective: Patient with improvement in dyspnea but not back to baseline, no chest pain. She is off bipap at the time of my examination.   Physical Exam: Vitals:   06/01/23 0322 06/01/23  0530 06/01/23 0700 06/01/23 0725  BP:  (!) 147/97 (!) 153/99   Pulse:  84 81   Resp:  (!) 25 (!) 25   Temp: (!) 97.1 F (36.2 C)   98.3 F (36.8 C)  TempSrc: Tympanic   Oral  SpO2:  98% 99%   Weight:      Height:       Neurology awake and alert ENT with mild pallor  Cardiovascular with S1 and S2 present and regular with no gallops or  rubs Mild JVD No lower extremity edema, mild edema at the thighs  Respiratory with rales bilaterally with no wheezing or rhonchi Abdomen with no distention  Data Reviewed:    Family Communication: no family at the bedside   Disposition: Status is: Inpatient Remains inpatient appropriate because:  diuresis   Planned Discharge Destination: Home    Author: Coralie Keens, MD 06/01/2023 7:43 AM  For on call review [email or web address].

## 2023-06-01 NOTE — Progress Notes (Signed)
 PHARMACY - ANTICOAGULATION CONSULT NOTE  Pharmacy Consult for warfarin Indication: atrial fibrillation and DVT  Allergies  Allergen Reactions   Heparin Hives    Patient attests severe allergy- rash all over, severe itching, unable to take heparin  Can take lovenox   Other Itching and Rash    "Sheets and Linens used by [name]" Mainly while getting the heparin    Patient Measurements: Height: 5\' 5"  (165.1 cm) Weight: 116.3 kg (256 lb 6.3 oz) IBW/kg (Calculated) : 57   Vital Signs: Temp: 98.3 F (36.8 C) (03/06 0725) Temp Source: Oral (03/06 0725) BP: 153/99 (03/06 0700) Pulse Rate: 81 (03/06 0700)  Labs: Recent Labs    05/31/23 0020 05/31/23 0113 05/31/23 0251 06/01/23 0614  HGB 14.7  16.3* 15.3*  --  14.1  HCT 46.3*  48.0* 45.0  --  43.5  PLT 197  --   --  146*  LABPROT 25.3*  --   --  26.2*  INR 2.3*  --   --  2.4*  CREATININE 1.23*  --   --  1.17*  TROPONINIHS 135*  --  128*  --     Estimated Creatinine Clearance: 51.3 mL/min (A) (by C-G formula based on SCr of 1.17 mg/dL (H)).   Medical History: Past Medical History:  Diagnosis Date   (HFpEF) heart failure with preserved ejection fraction (HCC)    a. 06/2008 Echo: EF 55-60%, mild LVH, triv AI, mild to mod MS, mod to sev dil LA, mildly dil RA.   Acute right MCA stroke (HCC)    a. 06/04/2010 Embolic stroke treated with TPA and thrombectomy with hemorrhagic transformation; Carotids 3/12 negative for ICA stenosis.   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 08/2005 - s/p IVC filter.   CHF (congestive heart failure) (HCC)    Depression    Embolus of femoral artery (HCC)    a. 06/2008 s/p bilateral embolectomies.   History of DVT (deep vein thrombosis)    a. s/p IVC filter.   HIT (heparin-induced thrombocytopenia) (HCC)    HTN (hypertension)    Lupus anticoagulant disorder (HCC)    Mitral stenosis    moderate-severe 09/04/20 echo   Obesity,  unspecified    Permanent atrial fibrillation (HCC)    a. On chronic Coumadin - INRs followed by PCP; b. CHA2DS2VASc = 5.   Popliteal artery embolism, right (HCC)    a. 01/2017 in the setting of subRx INR-->s/p embolectomy.     Assessment: 18 yoF with PHM of atrial fibrillaiton and PE on warfarin PTA (admit INR 2.3) last dose 3/4. CBC stable. Pharmacy consulted to manage warfarin. Will continue home regimen for now since INR at goal.   INR therapeutic 2.4 today  Home warfarin: 3mg  Mon, Wed, Fri, Sun and 4.5mg  all other days   Goal of Therapy:  Heparin level 0.3-0.7 units/ml Monitor platelets by anticoagulation protocol: Yes   Plan:  Continue warfarin per home regimen Monitor INR and CBC  Ruben Im, PharmD Clinical Pharmacist 06/01/2023 8:33 AM Please check AMION for all [facility] Pharmacy numbers

## 2023-06-01 NOTE — Assessment & Plan Note (Signed)
 Renal function stable with serum cr at 1,17 with K at 3,8 and serum bicarbonate at 28  Na 137   Follow up renal function in am.  Continue diuresis with furosemide Avoid hypotension and nephrotoxic medications.

## 2023-06-02 ENCOUNTER — Inpatient Hospital Stay (HOSPITAL_COMMUNITY)

## 2023-06-02 DIAGNOSIS — I5033 Acute on chronic diastolic (congestive) heart failure: Secondary | ICD-10-CM | POA: Diagnosis not present

## 2023-06-02 DIAGNOSIS — J9601 Acute respiratory failure with hypoxia: Secondary | ICD-10-CM | POA: Diagnosis not present

## 2023-06-02 DIAGNOSIS — I10 Essential (primary) hypertension: Secondary | ICD-10-CM | POA: Diagnosis not present

## 2023-06-02 DIAGNOSIS — Z862 Personal history of diseases of the blood and blood-forming organs and certain disorders involving the immune mechanism: Secondary | ICD-10-CM | POA: Diagnosis not present

## 2023-06-02 DIAGNOSIS — J9602 Acute respiratory failure with hypercapnia: Secondary | ICD-10-CM | POA: Diagnosis not present

## 2023-06-02 LAB — BASIC METABOLIC PANEL
Anion gap: 9 (ref 5–15)
BUN: 25 mg/dL — ABNORMAL HIGH (ref 8–23)
CO2: 30 mmol/L (ref 22–32)
Calcium: 8.2 mg/dL — ABNORMAL LOW (ref 8.9–10.3)
Chloride: 96 mmol/L — ABNORMAL LOW (ref 98–111)
Creatinine, Ser: 0.97 mg/dL (ref 0.44–1.00)
GFR, Estimated: >60 mL/min (ref >60)
Glucose, Bld: 101 mg/dL — ABNORMAL HIGH (ref 70–99)
Potassium: 3.4 mmol/L — ABNORMAL LOW (ref 3.5–5.1)
Sodium: 135 mmol/L (ref 135–145)

## 2023-06-02 LAB — CBC
HCT: 42.3 % (ref 36.0–46.0)
Hemoglobin: 13.7 g/dL (ref 12.0–15.0)
MCH: 32 pg (ref 26.0–34.0)
MCHC: 32.4 g/dL (ref 30.0–36.0)
MCV: 98.8 fL (ref 80.0–100.0)
Platelets: 146 10*3/uL — ABNORMAL LOW (ref 150–400)
RBC: 4.28 MIL/uL (ref 3.87–5.11)
RDW: 13.4 % (ref 11.5–15.5)
WBC: 15.5 10*3/uL — ABNORMAL HIGH (ref 4.0–10.5)
nRBC: 0 % (ref 0.0–0.2)

## 2023-06-02 LAB — GLUCOSE, CAPILLARY
Glucose-Capillary: 104 mg/dL — ABNORMAL HIGH (ref 70–99)
Glucose-Capillary: 92 mg/dL (ref 70–99)
Glucose-Capillary: 122 mg/dL — ABNORMAL HIGH (ref 70–99)
Glucose-Capillary: 104 mg/dL — ABNORMAL HIGH (ref 70–99)
Glucose-Capillary: 128 mg/dL — ABNORMAL HIGH (ref 70–99)

## 2023-06-02 LAB — PROTIME-INR
INR: 2.8 — ABNORMAL HIGH (ref 0.8–1.2)
Prothrombin Time: 29.9 s — ABNORMAL HIGH (ref 11.4–15.2)

## 2023-06-02 LAB — MAGNESIUM: Magnesium: 1.8 mg/dL (ref 1.7–2.4)

## 2023-06-02 MED ORDER — POTASSIUM CHLORIDE CRYS ER 20 MEQ PO TBCR
40.0000 meq | EXTENDED_RELEASE_TABLET | Freq: Two times a day (BID) | ORAL | Status: DC
  Administered 2023-06-02: 40 meq via ORAL
  Filled 2023-06-02: qty 2

## 2023-06-02 MED ORDER — SPIRONOLACTONE 25 MG PO TABS
25.0000 mg | ORAL_TABLET | Freq: Every day | ORAL | Status: DC
  Administered 2023-06-02 – 2023-06-06 (×5): 25 mg via ORAL
  Filled 2023-06-02 (×5): qty 1

## 2023-06-02 NOTE — Progress Notes (Signed)
 [facility] PROGRESS NOTE   [name] ZOX:[id number] DOB: 09-Jan-1946 DOA: 05/31/2023  PCP: Marden Noble, MD (Inactive)  Brief History: 78 yo female with the past medical history of hypertension, heart failure, atrial fibrillation, mitral stenosis, history of CVA, obesity and lupus hyper-coagulant who presented with worsening dyspnea.  Chest x-ray suggested pulmonary edema.  Patient was hospitalized for further management.     Consultants: Cardiology  Procedures: None yet    Subjective/Interval History: Patient mentions that she is feeling slightly better.  Has been urinating.  Denies any chest pain.  No dizziness or lightheadedness.  No nausea or vomiting.    Assessment/Plan:  Acute on chronic diastolic CHF/mitral stenosis Echocardiogram with preserved LV systolic function with EF 55%, mild LVH. RV systolic function is preserved. LA with severe dilatation, RA with moderate dilatation, moderate to severe mitral stenosis , mild TR.  Patient noted to be on IV furosemide 40 mg twice a day. Strict ins and outs and daily weights. Patient noted to be on metoprolol and Entresto.  Renal function is stable.   Acute hypoxemic respiratory failure due to acute cardiogenic pulmonary edema Was on BiPAP initially.  Now off of it.   Currently on nasal cannula oxygen at 4 L/min.  Hopefully can be weaned down to maintain saturations greater than 90%. Chest x-ray done this morning shows some improvement in the infiltrates but still persists.  Will repeat chest x-ray in 48 hours. Patient noted to be empirically on antibiotics due to concern for community-acquired pneumonia.  Patient did have elevated WBC but seems to be improving.   Elevated troponin This is likely due to heart failure exacerbation, ruled out acute coronary syndrome.    Chronic atrial fibrillation with RVR (HCC) This is in the setting of mitral stenosis.   Continue rate control with metoprolol and anticoagulation  with warfarin  INR is 2.4  Continue telemetry monitoring.    Essential hypertension Occasional high readings are noted.  Patient currently on metoprolol, Entresto.  May need to adjust medication dosage.   CKD stage 3a, GFR 45-59 ml/min (HCC) Renal function is stable.  Monitor urine output.  Avoid nephrotoxic agents.    Hypokalemia Will be supplemented   History of lupus anticoagulant disorder History of DVT and PE Continue anticoagulation with warfarin    Obesity, class 3 Estimated body mass index is 42.67 kg/m as calculated from the following:   Height as of this encounter: 5\' 5"  (1.651 m).   Weight as of this encounter: 116.3 kg.  DVT Prophylaxis: Anticoagulated with warfarin Code Status: Full code Family Communication: Discussed with patient Disposition Plan: Start mobilizing.  Anticipate return home when improved  Status is: Inpatient Remains inpatient appropriate because: Acute respiratory failure with hypoxia.  Acute diastolic CHF      Medications: Scheduled:  doxycycline  100 mg Oral Q12H   furosemide  40 mg Intravenous BID   insulin aspart  0-9 Units Subcutaneous Q4H   metoprolol tartrate  25 mg Oral BID   potassium chloride  40 mEq Oral BID   sacubitril-valsartan  1 tablet Oral BID   sodium chloride flush  3 mL Intravenous Q12H   warfarin  3 mg Oral Once per day on Sunday Monday Wednesday Friday   And   warfarin  4.5 mg Oral Once per day on Tuesday Thursday Saturday   Warfarin - Pharmacist Dosing Inpatient   Does not apply q1600   Continuous:  cefTRIAXone (ROCEPHIN)  IV 2 g (06/01/23 2103)   WJX:[id number] **  OR** acetaminophen, albuterol, FLUoxetine  Antibiotics: Anti-infectives (From admission, onward)    Start     Dose/Rate Route Frequency Ordered Stop   05/31/23 2200  cefTRIAXone (ROCEPHIN) 2 g in sodium chloride 0.9 % 100 mL IVPB        2 g 200 mL/hr over 30 Minutes Intravenous Every 24 hours 05/31/23 2119 06/05/23 2159   05/31/23 2200   doxycycline (VIBRA-TABS) tablet 100 mg        100 mg Oral Every 12 hours 05/31/23 2123 06/05/23 2159   05/31/23 2130  doxycycline (VIBRAMYCIN) 100 mg in sodium chloride 0.9 % 250 mL IVPB  Status:  Discontinued        100 mg 125 mL/hr over 120 Minutes Intravenous Every 12 hours 05/31/23 2120 05/31/23 2123       Objective:  Vital Signs  Vitals:   06/01/23 2055 06/01/23 2357 06/02/23 0400 06/02/23 0847  BP: (!) 166/124 134/80 (!) 136/90 (!) 154/90  Pulse: (!) 108 82 81 100  Resp:  19 18 (!) 22  Temp:  98.2 F (36.8 C)  98.2 F (36.8 C)  TempSrc:  Oral  Oral  SpO2:  97% 97% 92%  Weight:      Height:        Intake/Output Summary (Last 24 hours) at 06/02/2023 0927 Last data filed at 06/01/2023 2150 Gross per 24 hour  Intake 299.29 ml  Output 1000 ml  Net -700.71 ml   Filed Weights   05/31/23 0037  Weight: 116.3 kg    General appearance: Awake alert.  In no distress Resp: Noted to be mildly tachypneic at rest.  Crackles bilateral bases.  No wheezing or rhonchi. Cardio: S1-S2 is normal regular.  No S3-S4.  No rubs murmurs or bruit GI: Abdomen is soft.  Nontender nondistended.  Bowel sounds are present normal.  No masses organomegaly Extremities: No edema.  Full range of motion of lower extremities. Neurologic: Alert and oriented x3.  No focal neurological deficits.    Lab Results:  Data Reviewed: I have personally reviewed following labs and reports of the imaging studies  CBC: Recent Labs  Lab 05/31/23 0020 05/31/23 0113 06/01/23 0614 06/02/23 0532  WBC 11.5*  --  18.0* 15.5*  HGB 14.7  16.3* 15.3* 14.1 13.7  HCT 46.3*  48.0* 45.0 43.5 42.3  MCV 101.5*  --  98.9 98.8  PLT 197  --  146* 146*    Basic Metabolic Panel: Recent Labs  Lab 05/31/23 0020 05/31/23 0113 06/01/23 0614 06/02/23 0532  NA 137  136 138 137 135  K 4.8  4.8 4.2 3.8 3.4*  CL 98  --  97* 96*  CO2 22  --  28 30  GLUCOSE 295*  --  131* 101*  BUN 17  --  25* 25*  CREATININE 1.23*  --   1.17* 0.97  CALCIUM 8.8*  --  8.9 8.2*  MG  --   --   --  1.8    GFR: Estimated Creatinine Clearance: 61.9 mL/min (by C-G formula based on SCr of 0.97 mg/dL).  Liver Function Tests: Recent Labs  Lab 05/31/23 0020  AST 26  ALT 19  ALKPHOS 100  BILITOT 1.3*  PROT 8.0  ALBUMIN 3.4*    Coagulation Profile: Recent Labs  Lab 05/31/23 0020 06/01/23 0614 06/02/23 0532  INR 2.3* 2.4* 2.8*     HbA1C: Recent Labs    05/31/23 0800  HGBA1C 5.8*    CBG: Recent Labs  Lab 06/01/23 1336 06/01/23  1522 06/01/23 2339 06/02/23 0459 06/02/23 0851  GLUCAP 119* 151* 114* 104* 92    Thyroid Function Tests: Recent Labs    05/31/23 0800  TSH 1.045     Recent Results (from the past 240 hours)  Culture, blood (Routine X 2) w Reflex to ID Panel     Status: None (Preliminary result)   Collection Time: 05/31/23  7:40 AM   Specimen: BLOOD LEFT HAND  Result Value Ref Range Status   Specimen Description BLOOD LEFT HAND  Final   Special Requests   Final    BOTTLES DRAWN AEROBIC AND ANAEROBIC Blood Culture adequate volume   Culture   Final    NO GROWTH 1 DAY Performed at [facility], [address], [place], [place] [address]    Report Status PENDING  Incomplete  Resp panel by RT-PCR (RSV, Flu A&B, Covid) Anterior Nasal Swab     Status: None   Collection Time: 05/31/23  7:42 AM   Specimen: Anterior Nasal Swab  Result Value Ref Range Status   SARS Coronavirus 2 by RT PCR NEGATIVE NEGATIVE Final   Influenza A by PCR NEGATIVE NEGATIVE Final   Influenza B by PCR NEGATIVE NEGATIVE Final    Comment: (NOTE) The Xpert Xpress SARS-CoV-2/FLU/RSV plus assay is intended as an aid in the diagnosis of influenza from Nasopharyngeal swab specimens and should not be used as a sole basis for treatment. Nasal washings and aspirates are unacceptable for Xpert Xpress SARS-CoV-2/FLU/RSV testing.  Fact Sheet for Patients: BloggerCourse.com  Fact Sheet for  Healthcare Providers: SeriousBroker.it  This test is not yet approved or cleared by the [place] FDA and has been authorized for detection and/or diagnosis of SARS-CoV-2 by FDA under an Emergency Use Authorization (EUA). This EUA will remain in effect (meaning this test can be used) for the duration of the COVID-19 declaration under Section 564(b)(1) of the Act, 21 U.S.C. section 360bbb-3(b)(1), unless the authorization is terminated or revoked.     Resp Syncytial Virus by PCR NEGATIVE NEGATIVE Final    Comment: (NOTE) Fact Sheet for Patients: BloggerCourse.com  Fact Sheet for Healthcare Providers: SeriousBroker.it  This test is not yet approved or cleared by the [place] FDA and has been authorized for detection and/or diagnosis of SARS-CoV-2 by FDA under an Emergency Use Authorization (EUA). This EUA will remain in effect (meaning this test can be used) for the duration of the COVID-19 declaration under Section 564(b)(1) of the Act, 21 U.S.C. section 360bbb-3(b)(1), unless the authorization is terminated or revoked.  Performed at [facility], [address], [place], [place] [address]   Culture, blood (Routine X 2) w Reflex to ID Panel     Status: None (Preliminary result)   Collection Time: 05/31/23  8:30 AM   Specimen: BLOOD RIGHT WRIST  Result Value Ref Range Status   Specimen Description BLOOD RIGHT WRIST  Final   Special Requests   Final    BOTTLES DRAWN AEROBIC AND ANAEROBIC Blood Culture adequate volume   Culture   Final    NO GROWTH 1 DAY Performed at [facility], [address] [place], [place] [address]    Report Status PENDING  Incomplete      Radiology Studies: ECHOCARDIOGRAM COMPLETE Result Date: 05/31/2023    ECHOCARDIOGRAM REPORT   Patient Name:   [name] Date of Exam: 05/31/2023 Medical Rec #:  [id number]       Height:       65.0  in Accession #:     [id number]      Weight:       256.4 lb Date of Birth:  02-07-1946       BSA:          2.198 m Patient Age:    77 years        BP:           153/95 mmHg Patient Gender: F               HR:           111 bpm. Exam Location:  Inpatient Procedure: 2D Echo, Color Doppler, Cardiac Doppler and Intracardiac            Opacification Agent (Both Spectral and Color Flow Doppler were            utilized during procedure). Indications:    I48.91* Unspecified atrial fibrillation  History:        Patient has prior history of Echocardiogram examinations. CHF,                 Arrythmias:Atrial Fibrillation; Risk Factors:Hypertension.  Sonographer:    Tiney Rouge RDCS Referring Phys: [id number] RONDELL A SMITH IMPRESSIONS  1. Abnormal septal motion . Left ventricular ejection fraction, by estimation, is 55%. The left ventricle has normal function. The left ventricle has no regional wall motion abnormalities. There is mild left ventricular hypertrophy. Left ventricular diastolic parameters are indeterminate.  2. Right ventricular systolic function is normal. The right ventricular size is normal.  3. Left atrial size was severely dilated.  4. Right atrial size was moderately dilated.  5. Rheuamtic MV with mean gradient 14 peak 22.4 mmHg at HR 83 bpm and MVA 1.03 cm2. The mitral valve is rheumatic. Trivial mitral valve regurgitation. Moderate to severe mitral stenosis.  6. The aortic valve is tricuspid. There is moderate calcification of the aortic valve. There is moderate thickening of the aortic valve. Aortic valve regurgitation is mild. Aortic valve sclerosis is present, with no evidence of aortic valve stenosis.  7. The inferior vena cava is normal in size with greater than 50% respiratory variability, suggesting right atrial pressure of 3 mmHg. FINDINGS  Left Ventricle: Abnormal septal motion. Left ventricular ejection fraction, by estimation, is 55%. The left ventricle has normal function. The left ventricle has no regional wall  motion abnormalities. Definity contrast agent was given IV to delineate the left ventricular endocardial borders. Strain was performed and the global longitudinal strain is indeterminate. The left ventricular internal cavity size was normal in size. There is mild left ventricular hypertrophy. Left ventricular diastolic parameters are indeterminate. Right Ventricle: The right ventricular size is normal. No increase in right ventricular wall thickness. Right ventricular systolic function is normal. Left Atrium: Left atrial size was severely dilated. Right Atrium: Right atrial size was moderately dilated. Pericardium: There is no evidence of pericardial effusion. Mitral Valve: Rheuamtic MV with mean gradient 14 peak 22.4 mmHg at HR 83 bpm and MVA 1.03 cm2. The mitral valve is rheumatic. Trivial mitral valve regurgitation. Moderate to severe mitral valve stenosis. MV peak gradient, 22.4 mmHg. The mean mitral valve  gradient is 14.0 mmHg. Tricuspid Valve: The tricuspid valve is normal in structure. Tricuspid valve regurgitation is mild . No evidence of tricuspid stenosis. Aortic Valve: The aortic valve is tricuspid. There is moderate calcification of the aortic valve. There is moderate thickening of the aortic valve. Aortic valve regurgitation is mild. Aortic valve sclerosis is present, with no  evidence of aortic valve stenosis. Aortic valve mean gradient measures 9.0 mmHg. Aortic valve peak gradient measures 15.4 mmHg. Aortic valve area, by VTI measures 1.14 cm. Pulmonic Valve: The pulmonic valve was normal in structure. Pulmonic valve regurgitation is not visualized. No evidence of pulmonic stenosis. Aorta: The aortic root is normal in size and structure. Venous: The inferior vena cava is normal in size with greater than 50% respiratory variability, suggesting right atrial pressure of 3 mmHg. IAS/Shunts: No atrial level shunt detected by color flow Doppler. Additional Comments: 3D was performed not requiring image  post processing on an independent workstation and was indeterminate.  LEFT VENTRICLE PLAX 2D LVIDd:         4.10 cm   Diastology LVIDs:         3.20 cm   LV e' medial:   8.16 cm/s LV PW:         1.30 cm   LV E/e' medial: 27.0 LV IVS:        1.30 cm LVOT diam:     1.90 cm LV SV:         41 LV SV Index:   19 LVOT Area:     2.84 cm  IVC IVC diam: 2.10 cm LEFT ATRIUM              Index         RIGHT ATRIUM           Index LA diam:        6.40 cm  2.91 cm/m    RA Area:     32.90 cm LA Vol (A2C):   258.0 ml 117.36 ml/m  RA Volume:   107.00 ml 48.67 ml/m LA Vol (A4C):   266.0 ml 121.00 ml/m LA Biplane Vol: 268.0 ml 121.91 ml/m  AORTIC VALVE AV Area (Vmax):    1.27 cm AV Area (Vmean):   1.22 cm AV Area (VTI):     1.14 cm AV Vmax:           196.00 cm/s AV Vmean:          142.000 cm/s AV VTI:            0.363 m AV Peak Grad:      15.4 mmHg AV Mean Grad:      9.0 mmHg LVOT Vmax:         87.70 cm/s LVOT Vmean:        60.950 cm/s LVOT VTI:          0.146 m LVOT/AV VTI ratio: 0.40  AORTA Ao Root diam: 2.70 cm Ao Asc diam:  3.50 cm MITRAL VALVE MV Area (PHT): 1.03 cm     SHUNTS MV Area VTI:   0.71 cm     Systemic VTI:  0.15 m MV Peak grad:  22.4 mmHg    Systemic Diam: 1.90 cm MV Mean grad:  14.0 mmHg MV Vmax:       2.37 m/s MV Vmean:      180.0 cm/s MV Decel Time: 733 msec MV E velocity: 220.00 cm/s Charlton Haws MD Electronically signed by Charlton Haws MD Signature Date/Time: 05/31/2023/12:27:33 PM    Final        LOS: 2 days   Osvaldo Shipper  [facility] Pager on [email or web address]  06/02/2023, 9:27 AM

## 2023-06-02 NOTE — Care Management Important Message (Signed)
 Important Message  Patient Details  Name: [name] MRN: [id number] Date of Birth: March 18, 1946   Important Message Given:  Yes - Medicare IM     Dorena Bodo 06/02/2023, 3:29 PM

## 2023-06-02 NOTE — Progress Notes (Addendum)
 Patient Name: [name] Date of Encounter: 06/02/2023 [facility] Cardiologist: Rollene Rotunda, MD   Interval Summary  .    Still short of breath today, tachypneic at rest.  Very wheezy.  Reports having good diuresis with significant improvement in symptoms however.  Vital Signs .    Vitals:   06/01/23 2055 06/01/23 2357 06/02/23 0400 06/02/23 0847  BP: (!) 166/124 134/80 (!) 136/90 (!) 154/90  Pulse: (!) 108 82 81 100  Resp:  19 18 (!) 22  Temp:  98.2 F (36.8 C)  98.2 F (36.8 C)  TempSrc:  Oral  Oral  SpO2:  97% 97% 92%  Weight:      Height:        Intake/Output Summary (Last 24 hours) at 06/02/2023 0929 Last data filed at 06/01/2023 2150 Gross per 24 hour  Intake 299.29 ml  Output 1000 ml  Net -700.71 ml      05/31/2023   12:37 AM 06/03/2022    5:00 AM 06/02/2022    5:00 AM  Last 3 Weights  Weight (lbs) 256 lb 6.3 oz 256 lb 6.3 oz 253 lb 8.5 oz  Weight (kg) 116.3 kg 116.3 kg 115 kg      Telemetry/ECG    Atrial fibrillation heart rates between 70-100.- Personally Reviewed  CV Studies    Echocardiogram 05/31/2023 1. Abnormal septal motion . Left ventricular ejection fraction, by  estimation, is 55%. The left ventricle has normal function. The left  ventricle has no regional wall motion abnormalities. There is mild left  ventricular hypertrophy. Left ventricular  diastolic parameters are indeterminate.   2. Right ventricular systolic function is normal. The right ventricular  size is normal.   3. Left atrial size was severely dilated.   4. Right atrial size was moderately dilated.   5. Rheuamtic MV with mean gradient 14 peak 22.4 mmHg at HR 83 bpm and MVA  1.03 cm2. The mitral valve is rheumatic. Trivial mitral valve  regurgitation. Moderate to severe mitral stenosis.   6. The aortic valve is tricuspid. There is moderate calcification of the  aortic valve. There is moderate thickening of the aortic valve. Aortic  valve regurgitation is mild.  Aortic valve sclerosis is present, with no  evidence of aortic valve stenosis.   7. The inferior vena cava is normal in size with greater than 50%  respiratory variability, suggesting right atrial pressure of 3 mmHg.   Physical Exam .   GEN: No acute distress.  On supplemental oxygen Neck: No JVD Cardiac: IRRR, no murmurs Respiratory: Crackles in lower bases, diffuse wheezing GI: Soft, nontender, non-distended  MS: No edema  Patient Profile    [name] is a 78 y.o. female has hx of heart failure with mildly reduced EF, Atrial fibrillation on warfarin, mitral stenosis, CVA, DVT/PE, lupus anticoagulant syndrome, depression.  Currently being evaluated for flash pulmonary edema.  Cardiology asked to see due to A-fib and CHF.  Assessment & Plan .     Chronic HFpEF Pulmonary edema with hypoxic respiratory failure Initially required BiPAP now titrated down to supplemental oxygen.  Echo previously mildly reduced on previous echocardiogram but this admission shows preserved EF 55% with normal RV function.  Severely dilated LA.  Diuresing well down 2 L in the last 24 hours with improvement in symptoms.  She has diffuse wheezing and crackles on exam.  Could be cardiogenic however could also be related to underlying pulmonary disease, no smoking history.  Chest x-ray is  pending. Continue with IV Lasix 40 mg twice daily Also on Entresto 49-51 mg, will add spironolactone 25 mg a day as well.  Will stop potassium supplementation for the time being.  Got one dose this morning. Can always add back.  Consider SGLT2 inhibitor at discharge.  Atrial fibrillation Permanent might be a more appropriate definition does not seem to have any attempts for restoration of NSR.  Initially presented RVR but heart rates well-controlled now in the 70s to 80s. Continue warfarin per pharmacy.  Continue with Lopressor 25 mg twice daily TSH normal.  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  Valve  area 1.03.  Not felt to be a surgical candidate.  Elevated troponins 135-125.  No anginal complaints, not consistent with ACS.  On warfarin  Lupus coagulant disorder/DVT/PE   For questions or updates, please contact [facility] Please consult [email or web address] for contact info under        Signed, Abagail Kitchens, PA-C    Personally seen and examined. Agree with above.  Still having issues with wheezing and shortness of breath. Chronic diastolic heart failure with pulmonary edema hypoxic respiratory failure diuresing with IV Lasix 40 mg twice a day with new start Entresto 49/51.  Spironolactone 25 mg has been added.  Stopping potassium supplementation.  She also has moderate to severe mitral stenosis, not felt to be a surgical candidate.  She also has lupus anticoagulant prior DVT PE.  On warfarin.  Donato Schultz, MD

## 2023-06-02 NOTE — Progress Notes (Signed)
 PHARMACY - ANTICOAGULATION CONSULT NOTE  Pharmacy Consult for warfarin Indication: atrial fibrillation and DVT  Allergies  Allergen Reactions   Heparin Hives    Patient attests severe allergy- rash all over, severe itching, unable to take heparin  Can take lovenox   Other Itching and Rash    "Sheets and Linens used by [name]" Mainly while getting the heparin    Patient Measurements: Height: 5\' 5"  (165.1 cm) Weight: 116.3 kg (256 lb 6.3 oz) IBW/kg (Calculated) : 57   Vital Signs: Temp: 98.2 F (36.8 C) (03/07 0847) Temp Source: Oral (03/07 0847) BP: 154/90 (03/07 0847) Pulse Rate: 100 (03/07 0847)  Labs: Recent Labs    05/31/23 0020 05/31/23 0113 05/31/23 0251 06/01/23 0614 06/02/23 0532  HGB 14.7  16.3* 15.3*  --  14.1 13.7  HCT 46.3*  48.0* 45.0  --  43.5 42.3  PLT 197  --   --  146* 146*  LABPROT 25.3*  --   --  26.2* 29.9*  INR 2.3*  --   --  2.4* 2.8*  CREATININE 1.23*  --   --  1.17* 0.97  TROPONINIHS 135*  --  128*  --   --     Estimated Creatinine Clearance: 61.9 mL/min (by C-G formula based on SCr of 0.97 mg/dL).   Assessment: 26 yoF with PHM of atrial fibrillaiton and PE on warfarin PTA (admit INR 2.3) last dose 3/4. CBC stable. Pharmacy consulted to manage warfarin. Will continue home regimen for now since INR at goal.   INR therapeutic 2.8 today  Home warfarin: 3mg  Mon, Wed, Fri, Sun and 4.5mg  all other days   Goal of Therapy:  Heparin level 0.3-0.7 units/ml Monitor platelets by anticoagulation protocol: Yes   Plan:  Continue warfarin per home regimen Monitor INR and CBC  Thank you. Okey Regal, PharmD 06/02/2023 9:26 AM Please check AMION for all [facility] Pharmacy numbers

## 2023-06-02 NOTE — TOC CM/SW Note (Signed)
 Transition of Care [facility]) - Inpatient Brief Assessment   Patient Details  Name: [name] MRN: [id number] Date of Birth: 04-Jan-1946  [facility] CM/SW Contact:    Mearl Latin, LCSW Phone Number: 06/02/2023, 2:14 PM   Clinical Narrative: Patient admitted from home with cardiology workup. No current TOC needs identified but please place consult if needs arise after therapy consults.    Transition of Care Asessment: Insurance and Status: Insurance coverage has been reviewed Patient has primary care physician: Yes Home environment has been reviewed: From home Prior level of function:: Independent Prior/Current Home Services: No current home services Social Drivers of Health Review: SDOH reviewed no interventions necessary Readmission risk has been reviewed: Yes Transition of care needs: no transition of care needs at this time

## 2023-06-02 NOTE — Plan of Care (Signed)

## 2023-06-02 NOTE — Discharge Instructions (Signed)
 To address social isolation:  Education officer, [employer] [place]: [phone] / [address], [place], [place] [address]  -Dial [phone]: Talk lifeline 24/7.  -[facility] Warmline: [phone]

## 2023-06-03 DIAGNOSIS — Z862 Personal history of diseases of the blood and blood-forming organs and certain disorders involving the immune mechanism: Secondary | ICD-10-CM | POA: Diagnosis not present

## 2023-06-03 DIAGNOSIS — I5033 Acute on chronic diastolic (congestive) heart failure: Secondary | ICD-10-CM | POA: Diagnosis not present

## 2023-06-03 DIAGNOSIS — I10 Essential (primary) hypertension: Secondary | ICD-10-CM | POA: Diagnosis not present

## 2023-06-03 DIAGNOSIS — J9601 Acute respiratory failure with hypoxia: Secondary | ICD-10-CM | POA: Diagnosis not present

## 2023-06-03 LAB — PROTIME-INR
INR: 2.6 — ABNORMAL HIGH (ref 0.8–1.2)
Prothrombin Time: 28.3 s — ABNORMAL HIGH (ref 11.4–15.2)

## 2023-06-03 LAB — BASIC METABOLIC PANEL WITH GFR
Anion gap: 12 (ref 5–15)
BUN: 28 mg/dL — ABNORMAL HIGH (ref 8–23)
CO2: 29 mmol/L (ref 22–32)
Calcium: 8.8 mg/dL — ABNORMAL LOW (ref 8.9–10.3)
Chloride: 94 mmol/L — ABNORMAL LOW (ref 98–111)
Creatinine, Ser: 1.07 mg/dL — ABNORMAL HIGH (ref 0.44–1.00)
GFR, Estimated: 53 mL/min — ABNORMAL LOW
Glucose, Bld: 112 mg/dL — ABNORMAL HIGH (ref 70–99)
Potassium: 3.6 mmol/L (ref 3.5–5.1)
Sodium: 135 mmol/L (ref 135–145)

## 2023-06-03 LAB — CBC
HCT: 46 % (ref 36.0–46.0)
Hemoglobin: 15 g/dL (ref 12.0–15.0)
MCH: 31.4 pg (ref 26.0–34.0)
MCHC: 32.6 g/dL (ref 30.0–36.0)
MCV: 96.4 fL (ref 80.0–100.0)
Platelets: 166 K/uL (ref 150–400)
RBC: 4.77 MIL/uL (ref 3.87–5.11)
RDW: 13.5 % (ref 11.5–15.5)
WBC: 10.8 K/uL — ABNORMAL HIGH (ref 4.0–10.5)
nRBC: 0 % (ref 0.0–0.2)

## 2023-06-03 LAB — GLUCOSE, CAPILLARY
Glucose-Capillary: 107 mg/dL — ABNORMAL HIGH (ref 70–99)
Glucose-Capillary: 117 mg/dL — ABNORMAL HIGH (ref 70–99)
Glucose-Capillary: 119 mg/dL — ABNORMAL HIGH (ref 70–99)
Glucose-Capillary: 121 mg/dL — ABNORMAL HIGH (ref 70–99)
Glucose-Capillary: 137 mg/dL — ABNORMAL HIGH (ref 70–99)
Glucose-Capillary: 244 mg/dL — ABNORMAL HIGH (ref 70–99)
Glucose-Capillary: 72 mg/dL (ref 70–99)

## 2023-06-03 LAB — MAGNESIUM: Magnesium: 1.8 mg/dL (ref 1.7–2.4)

## 2023-06-03 MED ORDER — ONDANSETRON HCL 4 MG/2ML IJ SOLN
4.0000 mg | Freq: Four times a day (QID) | INTRAMUSCULAR | Status: DC | PRN
Start: 2023-06-03 — End: 2023-06-08

## 2023-06-03 MED ORDER — GUAIFENESIN ER 600 MG PO TB12
600.0000 mg | ORAL_TABLET | Freq: Two times a day (BID) | ORAL | Status: DC
  Administered 2023-06-03 – 2023-06-08 (×10): 600 mg via ORAL
  Filled 2023-06-03 (×10): qty 1

## 2023-06-03 MED ORDER — GUAIFENESIN 100 MG/5ML PO LIQD
5.0000 mL | ORAL | Status: DC | PRN
  Administered 2023-06-03: 5 mL via ORAL
  Filled 2023-06-03: qty 5

## 2023-06-03 MED ORDER — POTASSIUM CHLORIDE CRYS ER 20 MEQ PO TBCR
40.0000 meq | EXTENDED_RELEASE_TABLET | Freq: Once | ORAL | Status: AC
  Administered 2023-06-03: 40 meq via ORAL
  Filled 2023-06-03: qty 2

## 2023-06-03 NOTE — Evaluation (Signed)
 Physical Therapy Evaluation Patient Details Name: [name] MRN: [id number] DOB: [birth year] Today's Date: 06/03/2023  History of Present Illness  Pt is a 78 y/o F presenting to ED On 3/5 wtih vomiting and SOB, admitted for acute on chronic CHF and possible CAP. PMH includes HTN, heart failure with mildly reduced EF, A fib on chronic anticoagulation, mitral stenosis, DVT, PT, HIT lupus atincoagulation disorder, depression, obesity  Clinical Impression  Upon entering room pt was leaning on table report pain in the low back from sitting in recliner. Pt pain improved with mobility but due to fatigue pt gait was limited this session. Pt was CGA for sit to stand after 3x attempt, CGA for short distance gait with decrease in O2 (though poor pleth reading) and CGA for sitting to supine bed mobility. Pt uses a RW at home and has a BSC she can place close to her lift recliner. Pt sleeps in lift recliner at home. Has sibling and a child who can help at home on discharge. Due to pt current functional status, home set up and available assistance at home recommending skilled physical therapy services 3x/week in order to address strength, balance and functional mobility to decrease risk for falls, injury and re-hospitalization.         If plan is discharge home, recommend the following: Assist for transportation;Help with stairs or ramp for entrance     Equipment Recommendations None recommended by PT     Functional Status Assessment Patient has had a recent decline in their functional status and demonstrates the ability to make significant improvements in function in a reasonable and predictable amount of time.     Precautions / Restrictions Precautions Precautions: Fall Precaution/Restrictions Comments: watch O2 Restrictions Weight Bearing Restrictions Per Provider Order: No      Mobility  Bed Mobility Overal bed mobility: Needs Assistance Bed Mobility: Sit to Supine       Sit to supine:  Contact guard assist   General bed mobility comments: CGA for safety and increased time.    Transfers Overall transfer level: Needs assistance Equipment used: Rolling walker (2 wheels) Transfers: Sit to/from Stand, Bed to chair/wheelchair/BSC Sit to Stand: Contact guard assist   Step pivot transfers: Contact guard assist       General transfer comment: pt attempted 3x using rocking and momentum to stand from recliner prior to standing with RW.    Ambulation/Gait Ambulation/Gait assistance: Contact guard assist Gait Distance (Feet): 15 Feet Assistive device: Rolling walker (2 wheels) Gait Pattern/deviations: Step-through pattern, Decreased stride length, Wide base of support, Trunk flexed Gait velocity: decreased Gait velocity interpretation: <1.31 ft/sec, indicative of household ambulator   General Gait Details: MIn to MOd UE support on RW with flexed trunk and uneven steps.     Balance Overall balance assessment: Needs assistance Sitting-balance support: Feet supported Sitting balance-Leahy Scale: Good     Standing balance support: Reliant on assistive device for balance, During functional activity Standing balance-Leahy Scale: Poor Standing balance comment: reliant on RW support         Pertinent Vitals/Pain Pain Assessment Pain Assessment: No/denies pain    Home Living Family/patient expects to be discharged to:: Private residence Living Arrangements: Alone Available Help at Discharge: Family;Available 24 hours/day Type of Home: House Home Access: Ramped entrance       Home Layout: Two level;Able to live on main level with bedroom/bathroom Home Equipment: Rolling Walker (2 wheels);Gilmer Mor - single point      Prior Function Prior Level of  Function : Needs assist;Driving             Mobility Comments: RW for mobility ADLs Comments: ind; ind with IADLs     Extremity/Trunk Assessment   Upper Extremity Assessment Upper Extremity Assessment: Defer to  OT evaluation    Lower Extremity Assessment Lower Extremity Assessment: Generalized weakness    Cervical / Trunk Assessment Cervical / Trunk Assessment: Kyphotic  Communication   Communication Communication: No apparent difficulties    Cognition Arousal: Alert Behavior During Therapy: WFL for tasks assessed/performed   PT - Cognitive impairments: No apparent impairments     Following commands: Intact             General Comments General comments (skin integrity, edema, etc.): Pt O2 sats were getting poor pleth line but dropping to  72% on room air when ambulating. Pt Oxygen was having trouble staying on during short distance gait. UPon sitting even with poor pleth line O2 sats up to 82%. Once O2 donned pt O2 sats back up to 87-90%        Assessment/Plan    PT Assessment Patient needs continued PT services  PT Problem List Decreased strength;Decreased balance;Decreased mobility;Decreased activity tolerance       PT Treatment Interventions DME instruction;Therapeutic activities;Gait training;Therapeutic exercise;Stair training;Balance training;Functional mobility training;Patient/family education    PT Goals (Current goals can be found in the Care Plan section)  Acute Rehab PT Goals Patient Stated Goal: to improve mobility PT Goal Formulation: With patient Time For Goal Achievement: 06/17/23 Potential to Achieve Goals: Good    Frequency Min 2X/week        AM-PAC PT "6 Clicks" Mobility  Outcome Measure Help needed turning from your back to your side while in a flat bed without using bedrails?: A Little Help needed moving from lying on your back to sitting on the side of a flat bed without using bedrails?: A Little Help needed moving to and from a bed to a chair (including a wheelchair)?: A Little Help needed standing up from a chair using your arms (e.g., wheelchair or bedside chair)?: A Little Help needed to walk in hospital room?: A Little Help needed  climbing 3-5 steps with a railing? : A Lot 6 Click Score: 17    End of Session Equipment Utilized During Treatment: Gait belt;Oxygen Activity Tolerance: Patient limited by fatigue Patient left: in bed;with bed alarm set;with call bell/phone within reach Nurse Communication: Mobility status PT Visit Diagnosis: Other abnormalities of gait and mobility (R26.89)    Time: 1914-7829 PT Time Calculation (min) (ACUTE ONLY): 16 min   Charges:   PT Evaluation $PT Eval Low Complexity: 1 Low   PT General Charges $$ ACUTE PT VISIT: 1 Visit        Harrel Carina, DPT, CLT  Acute Rehabilitation Services Office: [phone] (Secure chat preferred)   Claudia Desanctis 06/03/2023, 2:42 PM

## 2023-06-03 NOTE — Progress Notes (Signed)
 PHARMACY - ANTICOAGULATION CONSULT NOTE  Pharmacy Consult for warfarin Indication: atrial fibrillation and DVT  Allergies  Allergen Reactions   Heparin Hives    Patient attests severe allergy- rash all over, severe itching, unable to take heparin  Can take lovenox   Other Itching and Rash    "Sheets and Linens used by [name]" Mainly while getting the heparin    Patient Measurements: Height: 5\' 5"  (165.1 cm) Weight: 116.3 kg (256 lb 6.3 oz) IBW/kg (Calculated) : 57   Vital Signs: Temp: 97.9 F (36.6 C) (03/08 0712) Temp Source: Oral (03/08 0712) BP: 105/94 (03/08 0712) Pulse Rate: 93 (03/08 0712)  Labs: Recent Labs    06/01/23 0614 06/02/23 0532 06/03/23 0545  HGB 14.1 13.7 15.0  HCT 43.5 42.3 46.0  PLT 146* 146* 166  LABPROT 26.2* 29.9* 28.3*  INR 2.4* 2.8* 2.6*  CREATININE 1.17* 0.97 1.07*    Estimated Creatinine Clearance: 56.1 mL/min (A) (by C-G formula based on SCr of 1.07 mg/dL (H)).   Medical History: Past Medical History:  Diagnosis Date   (HFpEF) heart failure with preserved ejection fraction (HCC)    a. 06/2008 Echo: EF 55-60%, mild LVH, triv AI, mild to mod MS, mod to sev dil LA, mildly dil RA.   Acute right MCA stroke (HCC)    a. 06/04/2010 Embolic stroke treated with TPA and thrombectomy with hemorrhagic transformation; Carotids 3/12 negative for ICA stenosis.   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 08/2005 - s/p IVC filter.   CHF (congestive heart failure) (HCC)    Depression    Embolus of femoral artery (HCC)    a. 06/2008 s/p bilateral embolectomies.   History of DVT (deep vein thrombosis)    a. s/p IVC filter.   HIT (heparin-induced thrombocytopenia) (HCC)    HTN (hypertension)    Lupus anticoagulant disorder (HCC)    Mitral stenosis    moderate-severe 09/04/20 echo   Obesity, unspecified    Permanent atrial fibrillation (HCC)    a. On chronic Coumadin - INRs followed by  PCP; b. CHA2DS2VASc = 5.   Popliteal artery embolism, right (HCC)    a. 01/2017 in the setting of subRx INR-->s/p embolectomy.     Assessment: 23 yoF with PHM of atrial fibrillaiton and PE on warfarin PTA (admit INR 2.3) last dose 3/4. CBC stable. Pharmacy consulted to manage warfarin. Will continue home regimen for now since INR at goal.   INR therapeutic 2.6 today. CBC WNL - hgb 15, plts 166.   Home warfarin: 3mg  Mon, Wed, Fri, Sun and 4.5mg  all other days   Goal of Therapy:  Heparin level 0.3-0.7 units/ml Monitor platelets by anticoagulation protocol: Yes   Plan:  Continue warfarin per home regimen Monitor INR and CBC  Roslyn Smiling, PharmD PGY1 Pharmacy Resident 06/03/2023 8:16 AM

## 2023-06-03 NOTE — Progress Notes (Signed)
 Patient Name: [name] Date of Encounter: 06/03/2023 [facility] Cardiologist: Rollene Rotunda, MD   Interval Summary  .    Still short of breath today, tachypneic at rest.  Very wheezy.  Reports having good diuresis with significant improvement in symptoms however.  Vital Signs .    Vitals:   06/03/23 0339 06/03/23 0400 06/03/23 0712 06/03/23 0853  BP: 113/71 99/68 (!) 105/94 (!) 127/111  Pulse: 92 98 93 94  Resp: 20 (!) 42 17   Temp: 98 F (36.7 C)  97.9 F (36.6 C)   TempSrc:   Oral   SpO2: 100% 92% 98%   Weight:      Height:        Intake/Output Summary (Last 24 hours) at 06/03/2023 1118 Last data filed at 06/02/2023 2115 Gross per 24 hour  Intake 3 ml  Output --  Net 3 ml      05/31/2023   12:37 AM 06/03/2022    5:00 AM 06/02/2022    5:00 AM  Last 3 Weights  Weight (lbs) 256 lb 6.3 oz 256 lb 6.3 oz 253 lb 8.5 oz  Weight (kg) 116.3 kg 116.3 kg 115 kg      Telemetry/ECG    Atrial fibrillation heart rates between 70-100.- Personally Reviewed  CV Studies    Echocardiogram 05/31/2023 1. Abnormal septal motion . Left ventricular ejection fraction, by  estimation, is 55%. The left ventricle has normal function. The left  ventricle has no regional wall motion abnormalities. There is mild left  ventricular hypertrophy. Left ventricular  diastolic parameters are indeterminate.   2. Right ventricular systolic function is normal. The right ventricular  size is normal.   3. Left atrial size was severely dilated.   4. Right atrial size was moderately dilated.   5. Rheuamtic MV with mean gradient 14 peak 22.4 mmHg at HR 83 bpm and MVA  1.03 cm2. The mitral valve is rheumatic. Trivial mitral valve  regurgitation. Moderate to severe mitral stenosis.   6. The aortic valve is tricuspid. There is moderate calcification of the  aortic valve. There is moderate thickening of the aortic valve. Aortic  valve regurgitation is mild. Aortic valve sclerosis is present,  with no  evidence of aortic valve stenosis.   7. The inferior vena cava is normal in size with greater than 50%  respiratory variability, suggesting right atrial pressure of 3 mmHg.   Physical Exam .   GEN: No acute distress.  On supplemental oxygen Neck: No JVD Cardiac: IRRR, no murmurs Respiratory: Crackles in lower bases, diffuse wheezing GI: Soft, nontender, non-distended  MS: No edema  Patient Profile    [name] is a 78 y.o. female has hx of heart failure with mildly reduced EF, Atrial fibrillation on warfarin, mitral stenosis, CVA, DVT/PE, lupus anticoagulant syndrome, depression.  Currently being evaluated for flash pulmonary edema.  Cardiology asked to see due to A-fib and CHF.  Assessment & Plan .     Chronic HFpEF Pulmonary edema with hypoxic respiratory failure Initially required BiPAP now titrated down to supplemental oxygen.  Echo previously mildly reduced on previous echocardiogram but this admission shows preserved EF 55% with normal RV function.  Severely dilated LA.  Diuresing well down 2 L in the last 24 hours with improvement in symptoms.  She has diffuse wheezing and crackles on exam.  Could be cardiogenic however could also be related to underlying pulmonary disease, no smoking history.  Chest x-ray is pending. Continue with  IV Lasix 40 mg twice daily Also on Entresto 49-51 mg spironolactone 25 mg added.  Potassium supplementation discontinued -- monitor K+, replace as needed.   Consider SGLT2 inhibitor at discharge.  Atrial fibrillation Permanent might be a more appropriate definition does not seem to have any attempts for restoration of NSR.  Initially presented RVR but heart rates well-controlled now in the 70s to 80s. Continue warfarin per pharmacy.  Continue with Lopressor 25 mg twice daily TSH normal. Rhythm control would be very difficult with mitral stenosis and severe LA enlargement.  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   Valve area 1.03.  Not felt to be a surgical candidate.  Elevated troponins 135-125.  No anginal complaints, not consistent with ACS.  On warfarin  Lupus coagulant disorder/DVT/PE   For questions or updates, please contact [facility] Please consult [email or web address] for contact info under        Signed, Maurice Small, MD

## 2023-06-03 NOTE — Evaluation (Signed)
 Occupational Therapy Evaluation Patient Details Name: [name] MRN: [id number] DOB: 1946/02/24 Today's Date: 06/03/2023   History of Present Illness   Pt is a 78 y/o F presenting to ED On 3/5 wtih vomiting and SOB, admitted for acute on chronic CHF and possible CAP. PMH includes HTN, heart failure with mildly reduced EF, A fib on chronic anticoagulation, mitral stenosis, DVT, PT, HIT lupus atincoagulation disorder, depression, obesity     Clinical Impressions Pt ind at baseline with ADL and uses RW for functional mobility,pt lives alone but reports her family lives in the vicinity around her. Pt currently needing up to mod A for ADLs, min A for bed mobility and min A for transfers and short distance ambulation with RW. Pt with VSS on RA with ambulation in room, however desatting to high 80s once seated in chair, needed 3L to maintain low 90s, RN notified. Pt presenting with impairments listed below, will follow acutely. Recommend HHOT at d/c pending progression.     If plan is discharge home, recommend the following:   A little help with walking and/or transfers;A little help with bathing/dressing/bathroom;Assistance with cooking/housework;Direct supervision/assist for medications management;Direct supervision/assist for financial management;Assist for transportation;Help with stairs or ramp for entrance     Functional Status Assessment   Patient has had a recent decline in their functional status and demonstrates the ability to make significant improvements in function in a reasonable and predictable amount of time.     Equipment Recommendations   BSC/3in1     Recommendations for Other Services   PT consult     Precautions/Restrictions   Precautions Precautions: Fall Precaution/Restrictions Comments: watch O2 Restrictions Weight Bearing Restrictions Per Provider Order: No     Mobility Bed Mobility Overal bed mobility: Needs Assistance Bed Mobility: Supine to  Sit     Supine to sit: Min assist          Transfers Overall transfer level: Needs assistance Equipment used: Rolling walker (2 wheels) Transfers: Sit to/from Stand Sit to Stand: Min assist           General transfer comment: amb around bed to chair      Balance Overall balance assessment: Needs assistance Sitting-balance support: Feet supported Sitting balance-Leahy Scale: Good     Standing balance support: Reliant on assistive device for balance, During functional activity Standing balance-Leahy Scale: Poor Standing balance comment: reliant on RW support                           ADL either performed or assessed with clinical judgement   ADL Overall ADL's : Needs assistance/impaired Eating/Feeding: Supervision/ safety   Grooming: Contact guard assist;Standing   Upper Body Bathing: Minimal assistance   Lower Body Bathing: Moderate assistance   Upper Body Dressing : Minimal assistance   Lower Body Dressing: Moderate assistance   Toilet Transfer: Contact guard assist;Ambulation;Regular Toilet;Rolling walker (2 wheels)   Toileting- Clothing Manipulation and Hygiene: Contact guard assist       Functional mobility during ADLs: Contact guard assist;Rolling walker (2 wheels)       Vision   Vision Assessment?: No apparent visual deficits     Perception Perception: Not tested       Praxis Praxis: Not tested       Pertinent Vitals/Pain Pain Assessment Pain Assessment: No/denies pain     Extremity/Trunk Assessment Upper Extremity Assessment Upper Extremity Assessment: Generalized weakness   Lower Extremity Assessment Lower Extremity Assessment: Defer to  PT evaluation   Cervical / Trunk Assessment Cervical / Trunk Assessment: Kyphotic   Communication Communication Communication: No apparent difficulties   Cognition Arousal: Alert Behavior During Therapy: WFL for tasks assessed/performed Cognition: No apparent impairments                                Following commands: Intact       Cueing  General Comments      VSS RA with ambulation, pt desatting to 88% on RA once seated in chair, left on 3L O2 satting in low 90s RN aware   Exercises     Shoulder Instructions      Home Living Family/patient expects to be discharged to:: Private residence Living Arrangements: Alone Available Help at Discharge: Family;Available 24 hours/day Type of Home: House Home Access: Ramped entrance     Home Layout: Two level;Able to live on main level with bedroom/bathroom     Bathroom Shower/Tub: Tub/shower unit (does not shower at home, goes to her sisters to shower who has a walk in, sits for showers)   Bathroom Toilet: Standard     Home Equipment: Agricultural consultant (2 wheels);Cane - single point          Prior Functioning/Environment Prior Level of Function : Needs assist;Driving             Mobility Comments: RW for mobility ADLs Comments: ind; ind with IADLs    OT Problem List: Decreased strength;Decreased range of motion;Impaired balance (sitting and/or standing);Decreased activity tolerance;Decreased safety awareness;Cardiopulmonary status limiting activity   OT Treatment/Interventions: Self-care/ADL training;Therapeutic exercise;Energy conservation;DME and/or AE instruction;Therapeutic activities;Patient/family education;Balance training      OT Goals(Current goals can be found in the care plan section)   Acute Rehab OT Goals Patient Stated Goal: none stated OT Goal Formulation: With patient Time For Goal Achievement: 06/17/23 Potential to Achieve Goals: Good ADL Goals Pt Will Perform Upper Body Dressing: Independently;sitting Pt Will Perform Lower Body Dressing: Independently;sitting/lateral leans;sit to/from stand Pt Will Transfer to Toilet: Independently;ambulating;regular height toilet Additional ADL Goal #2: pt will verbalize x3 energy conservation strategies in prep for ADLs    OT Frequency:  Min 2X/week    Co-evaluation              AM-PAC OT "6 Clicks" Daily Activity     Outcome Measure Help from another person eating meals?: None Help from another person taking care of personal grooming?: A Little Help from another person toileting, which includes using toliet, bedpan, or urinal?: A Little Help from another person bathing (including washing, rinsing, drying)?: A Lot Help from another person to put on and taking off regular upper body clothing?: A Little Help from another person to put on and taking off regular lower body clothing?: A Lot 6 Click Score: 17   End of Session Equipment Utilized During Treatment: Gait belt;Rolling walker (2 wheels);Oxygen Nurse Communication: Mobility status;Other (comment) (left on 3L)  Activity Tolerance: Patient tolerated treatment well Patient left: in chair;with call bell/phone within reach;with chair alarm set;with nursing/sitter in room  OT Visit Diagnosis: Unsteadiness on feet (R26.81);Other abnormalities of gait and mobility (R26.89);Muscle weakness (generalized) (M62.81)                Time: 1610-9604 OT Time Calculation (min): 30 min Charges:  OT General Charges $OT Visit: 1 Visit OT Evaluation $OT Eval Moderate Complexity: 1 Mod OT Treatments $Therapeutic Activity: 8-22 mins  Temima Kutsch K, OTD, OTR/L  SecureChat Preferred Acute Rehab [phone]   Carver Fila Koonce 06/03/2023, 11:14 AM

## 2023-06-03 NOTE — Plan of Care (Signed)

## 2023-06-03 NOTE — Progress Notes (Signed)
 [facility] PROGRESS NOTE   [name] ZOX:[id number] DOB: [birth year] DOA: 05/31/2023  PCP: Marden Noble, MD (Inactive)  Brief History: 78 yo female with the past medical history of hypertension, heart failure, atrial fibrillation, mitral stenosis, history of CVA, obesity and lupus hyper-coagulant who presented with worsening dyspnea.  Chest x-ray suggested pulmonary edema.  Patient was hospitalized for further management.     Consultants: Cardiology  Procedures: None yet    Subjective/Interval History: Patient mentions that she is improving but still short of breath.  No chest pain.  No nausea or vomiting.    Assessment/Plan:  Acute on chronic diastolic CHF/mitral stenosis Echocardiogram with preserved LV systolic function with EF 55%, mild LVH. RV systolic function is preserved. LA with severe dilatation, RA with moderate dilatation, moderate to severe mitral stenosis , mild TR.  Patient noted to be on IV furosemide 40 mg twice a day. Seems to have diuresed.  No Daily weight measurements noted. Patient noted to be on metoprolol and Entresto.  Spironolactone was added yesterday. Cardiology continues to follow.  Creatinine noted to be slightly higher today.  Will recheck labs tomorrow.   Acute hypoxemic respiratory failure due to acute cardiogenic pulmonary edema Was on BiPAP initially.  Now off of it.   Does not use oxygen at home. Currently on nasal cannula oxygen at 4 L/min.  Hopefully can be weaned down to maintain saturations greater than 90%. Chest x-ray from yesterday was reported as not showing any significant changes but there appeared to be some improvement in the infiltrates.  Will recheck chest x-ray tomorrow morning.  Continue IV diuretics for now. Patient noted to be empirically on antibiotics due to concern for community-acquired pneumonia.  Procalcitonin was significantly elevated at 12.4.  Blood cultures negative so far.  Patient did have elevated WBC  but seems to be improving.  Will plan for a 5-day course.  Recheck procalcitonin.  Check BMP in the morning.   Elevated troponin This is likely due to heart failure exacerbation, ruled out acute coronary syndrome.    Chronic atrial fibrillation with RVR (HCC) This is in the setting of mitral stenosis.   Continue rate control with metoprolol and anticoagulation with warfarin  INR is 2.4  Continue telemetry monitoring.    Essential hypertension Blood pressure is reasonably well-controlled.  Noted to be on furosemide, metoprolol, Entresto, spironolactone.   CKD stage 3a, GFR 45-59 ml/min (HCC) Renal function is stable.  Monitor urine output.  Avoid nephrotoxic agents.    Hypokalemia Potassium level has improved.  Will give additional dose today.   History of lupus anticoagulant disorder History of DVT and PE Continue anticoagulation with warfarin    Obesity, class 3 Estimated body mass index is 42.67 kg/m as calculated from the following:   Height as of this encounter: 5\' 5"  (1.651 m).   Weight as of this encounter: 116.3 kg.  DVT Prophylaxis: Anticoagulated with warfarin Code Status: Full code Family Communication: Discussed with patient Disposition Plan: Start mobilizing.  Anticipate return home when improved      Medications: Scheduled:  doxycycline  100 mg Oral Q12H   furosemide  40 mg Intravenous BID   insulin aspart  0-9 Units Subcutaneous Q4H   metoprolol tartrate  25 mg Oral BID   sacubitril-valsartan  1 tablet Oral BID   sodium chloride flush  3 mL Intravenous Q12H   spironolactone  25 mg Oral Daily   warfarin  3 mg Oral Once per day on Sunday Monday Wednesday  Friday   And   warfarin  4.5 mg Oral Once per day on Tuesday Thursday Saturday   Warfarin - Pharmacist Dosing Inpatient   Does not apply q1600   Continuous:  cefTRIAXone (ROCEPHIN)  IV 2 g (06/02/23 2146)   MVH:[id number] **OR** acetaminophen, albuterol, FLUoxetine  Antibiotics: Anti-infectives  (From admission, onward)    Start     Dose/Rate Route Frequency Ordered Stop   05/31/23 2200  cefTRIAXone (ROCEPHIN) 2 g in sodium chloride 0.9 % 100 mL IVPB        2 g 200 mL/hr over 30 Minutes Intravenous Every 24 hours 05/31/23 2119 06/05/23 2159   05/31/23 2200  doxycycline (VIBRA-TABS) tablet 100 mg        100 mg Oral Every 12 hours 05/31/23 2123 06/05/23 2159   05/31/23 2130  doxycycline (VIBRAMYCIN) 100 mg in sodium chloride 0.9 % 250 mL IVPB  Status:  Discontinued        100 mg 125 mL/hr over 120 Minutes Intravenous Every 12 hours 05/31/23 2120 05/31/23 2123       Objective:  Vital Signs  Vitals:   06/03/23 0339 06/03/23 0400 06/03/23 0712 06/03/23 0853  BP: 113/71 99/68 (!) 105/94 (!) 127/111  Pulse: 92 98 93 94  Resp: 20 (!) 42 17   Temp: 98 F (36.7 C)  97.9 F (36.6 C)   TempSrc:   Oral   SpO2: 100% 92% 98%   Weight:      Height:        Intake/Output Summary (Last 24 hours) at 06/03/2023 0952 Last data filed at 06/02/2023 2115 Gross per 24 hour  Intake 123 ml  Output 900 ml  Net -777 ml   Filed Weights   05/31/23 0037  Weight: 116.3 kg    General appearance: Awake alert.  In no distress Resp: Mild tachypnea without use of accessory muscles.  Crackles bilateral bases.  Occasional wheezing. Cardio: S1-S2 is normal regular.  No S3-S4.  No rubs murmurs or bruit GI: Abdomen is soft.  Nontender nondistended.  Bowel sounds are present normal.  No masses organomegaly Extremities: No edema.  Full range of motion of lower extremities. Neurologic: Alert and oriented x3.  No focal neurological deficits.     Lab Results:  Data Reviewed: I have personally reviewed following labs and reports of the imaging studies  CBC: Recent Labs  Lab 05/31/23 0020 05/31/23 0113 06/01/23 0614 06/02/23 0532 06/03/23 0545  WBC 11.5*  --  18.0* 15.5* 10.8*  HGB 14.7  16.3* 15.3* 14.1 13.7 15.0  HCT 46.3*  48.0* 45.0 43.5 42.3 46.0  MCV 101.5*  --  98.9 98.8 96.4  PLT  197  --  146* 146* 166    Basic Metabolic Panel: Recent Labs  Lab 05/31/23 0020 05/31/23 0113 06/01/23 0614 06/02/23 0532 06/03/23 0545  NA 137  136 138 137 135 135  K 4.8  4.8 4.2 3.8 3.4* 3.6  CL 98  --  97* 96* 94*  CO2 22  --  28 30 29   GLUCOSE 295*  --  131* 101* 112*  BUN 17  --  25* 25* 28*  CREATININE 1.23*  --  1.17* 0.97 1.07*  CALCIUM 8.8*  --  8.9 8.2* 8.8*  MG  --   --   --  1.8 1.8    GFR: Estimated Creatinine Clearance: 56.1 mL/min (A) (by C-G formula based on SCr of 1.07 mg/dL (H)).  Liver Function Tests: Recent Labs  Lab 05/31/23 0020  AST 26  ALT 19  ALKPHOS 100  BILITOT 1.3*  PROT 8.0  ALBUMIN 3.4*    Coagulation Profile: Recent Labs  Lab 05/31/23 0020 06/01/23 0614 06/02/23 0532 06/03/23 0545  INR 2.3* 2.4* 2.8* 2.6*     CBG: Recent Labs  Lab 06/02/23 1714 06/02/23 2112 06/03/23 0115 06/03/23 0340 06/03/23 0806  GLUCAP 104* 128* 119* 117* 107*     Recent Results (from the past 240 hours)  Culture, blood (Routine X 2) w Reflex to ID Panel     Status: None (Preliminary result)   Collection Time: 05/31/23  7:40 AM   Specimen: BLOOD LEFT HAND  Result Value Ref Range Status   Specimen Description BLOOD LEFT HAND  Final   Special Requests   Final    BOTTLES DRAWN AEROBIC AND ANAEROBIC Blood Culture adequate volume   Culture   Final    NO GROWTH 2 DAYS Performed at [facility], [address], [place], [place] [address]    Report Status PENDING  Incomplete  Resp panel by RT-PCR (RSV, Flu A&B, Covid) Anterior Nasal Swab     Status: None   Collection Time: 05/31/23  7:42 AM   Specimen: Anterior Nasal Swab  Result Value Ref Range Status   SARS Coronavirus 2 by RT PCR NEGATIVE NEGATIVE Final   Influenza A by PCR NEGATIVE NEGATIVE Final   Influenza B by PCR NEGATIVE NEGATIVE Final    Comment: (NOTE) The Xpert Xpress SARS-CoV-2/FLU/RSV plus assay is intended as an aid in the diagnosis of influenza from Nasopharyngeal  swab specimens and should not be used as a sole basis for treatment. Nasal washings and aspirates are unacceptable for Xpert Xpress SARS-CoV-2/FLU/RSV testing.  Fact Sheet for Patients: BloggerCourse.com  Fact Sheet for Healthcare Providers: SeriousBroker.it  This test is not yet approved or cleared by the [place] FDA and has been authorized for detection and/or diagnosis of SARS-CoV-2 by FDA under an Emergency Use Authorization (EUA). This EUA will remain in effect (meaning this test can be used) for the duration of the COVID-19 declaration under Section 564(b)(1) of the Act, 21 U.S.C. section 360bbb-3(b)(1), unless the authorization is terminated or revoked.     Resp Syncytial Virus by PCR NEGATIVE NEGATIVE Final    Comment: (NOTE) Fact Sheet for Patients: BloggerCourse.com  Fact Sheet for Healthcare Providers: SeriousBroker.it  This test is not yet approved or cleared by the [place] FDA and has been authorized for detection and/or diagnosis of SARS-CoV-2 by FDA under an Emergency Use Authorization (EUA). This EUA will remain in effect (meaning this test can be used) for the duration of the COVID-19 declaration under Section 564(b)(1) of the Act, 21 U.S.C. section 360bbb-3(b)(1), unless the authorization is terminated or revoked.  Performed at [facility], [address] [place], [place] [address]   Culture, blood (Routine X 2) w Reflex to ID Panel     Status: None (Preliminary result)   Collection Time: 05/31/23  8:30 AM   Specimen: BLOOD RIGHT WRIST  Result Value Ref Range Status   Specimen Description BLOOD RIGHT WRIST  Final   Special Requests   Final    BOTTLES DRAWN AEROBIC AND ANAEROBIC Blood Culture adequate volume   Culture   Final    NO GROWTH 2 DAYS Performed at [facility], [address] [place], [place] [address]    Report  Status PENDING  Incomplete      Radiology Studies: DG Chest 1 View Result  Date: 06/02/2023 CLINICAL DATA:  Follow-up shortness of breath EXAM: CHEST  1 VIEW COMPARISON:  X-ray 05/31/2023 FINDINGS: Enlarged cardiopericardial silhouette with calcified aorta. Enlargement of the pulmonary arteries. Persistent right lung base opacity. No pneumothorax or effusion. Overlapping cardiac leads. Curvature of the spine with degenerative changes and osteopenia. IMPRESSION: No significant oval change when adjusted for technique. Electronically Signed   By: Karen Kays M.D.   On: 06/02/2023 11:24       LOS: 3 days   Jaivian Battaglini Rito Ehrlich  [facility] Pager on [email or web address]  06/03/2023, 9:52 AM

## 2023-06-04 ENCOUNTER — Inpatient Hospital Stay (HOSPITAL_COMMUNITY)

## 2023-06-04 DIAGNOSIS — I5033 Acute on chronic diastolic (congestive) heart failure: Secondary | ICD-10-CM | POA: Diagnosis not present

## 2023-06-04 LAB — PROTIME-INR
INR: 2.9 — ABNORMAL HIGH (ref 0.8–1.2)
Prothrombin Time: 30.8 s — ABNORMAL HIGH (ref 11.4–15.2)

## 2023-06-04 LAB — C-REACTIVE PROTEIN: CRP: 11.4 mg/dL — ABNORMAL HIGH (ref <1.0)

## 2023-06-04 LAB — BASIC METABOLIC PANEL
Anion gap: 12 (ref 5–15)
BUN: 35 mg/dL — ABNORMAL HIGH (ref 8–23)
CO2: 32 mmol/L (ref 22–32)
Calcium: 8.8 mg/dL — ABNORMAL LOW (ref 8.9–10.3)
Chloride: 95 mmol/L — ABNORMAL LOW (ref 98–111)
Creatinine, Ser: 1.15 mg/dL — ABNORMAL HIGH (ref 0.44–1.00)
GFR, Estimated: 49 mL/min — ABNORMAL LOW (ref >60)
Glucose, Bld: 108 mg/dL — ABNORMAL HIGH (ref 70–99)
Potassium: 3.8 mmol/L (ref 3.5–5.1)
Sodium: 139 mmol/L (ref 135–145)

## 2023-06-04 LAB — GLUCOSE, CAPILLARY
Glucose-Capillary: 119 mg/dL — ABNORMAL HIGH (ref 70–99)
Glucose-Capillary: 123 mg/dL — ABNORMAL HIGH (ref 70–99)
Glucose-Capillary: 133 mg/dL — ABNORMAL HIGH (ref 70–99)
Glucose-Capillary: 146 mg/dL — ABNORMAL HIGH (ref 70–99)
Glucose-Capillary: 162 mg/dL — ABNORMAL HIGH (ref 70–99)
Glucose-Capillary: 60 mg/dL — ABNORMAL LOW (ref 70–99)
Glucose-Capillary: 96 mg/dL (ref 70–99)

## 2023-06-04 LAB — CBC
HCT: 46.8 % — ABNORMAL HIGH (ref 36.0–46.0)
Hemoglobin: 15.3 g/dL — ABNORMAL HIGH (ref 12.0–15.0)
MCH: 32.1 pg (ref 26.0–34.0)
MCHC: 32.7 g/dL (ref 30.0–36.0)
MCV: 98.1 fL (ref 80.0–100.0)
Platelets: 163 10*3/uL (ref 150–400)
RBC: 4.77 MIL/uL (ref 3.87–5.11)
RDW: 13.7 % (ref 11.5–15.5)
WBC: 10.5 10*3/uL (ref 4.0–10.5)
nRBC: 0 % (ref 0.0–0.2)

## 2023-06-04 LAB — PROCALCITONIN: Procalcitonin: 3.26 ng/mL

## 2023-06-04 LAB — BRAIN NATRIURETIC PEPTIDE: B Natriuretic Peptide: 68.5 pg/mL (ref 0.0–100.0)

## 2023-06-04 LAB — MAGNESIUM: Magnesium: 2 mg/dL (ref 1.7–2.4)

## 2023-06-04 MED ORDER — FUROSEMIDE 10 MG/ML IJ SOLN
40.0000 mg | Freq: Every day | INTRAMUSCULAR | Status: DC
  Administered 2023-06-05 – 2023-06-06 (×2): 40 mg via INTRAVENOUS
  Filled 2023-06-04 (×2): qty 4

## 2023-06-04 NOTE — Progress Notes (Signed)
 SATURATION QUALIFICATIONS: (This note is used to comply with regulatory documentation for home oxygen)  Patient Saturations on Room Air at Rest = 91%  Patient Saturations on Room Air while Ambulating = 85%  Patient Saturations on 3 Liters of oxygen while Ambulating = 92 %  Please briefly explain why patient needs home oxygen:PT endorses DOE, patient oxygen levels decreased with exertion and improved when oxygen applied.

## 2023-06-04 NOTE — Plan of Care (Signed)

## 2023-06-04 NOTE — Plan of Care (Signed)
  Problem: Coping: Goal: Ability to adjust to condition or change in health will improve Outcome: Progressing   Problem: Fluid Volume: Goal: Ability to maintain a balanced intake and output will improve Outcome: Progressing   Problem: Health Behavior/Discharge Planning: Goal: Ability to manage health-related needs will improve Outcome: Progressing   Problem: Metabolic: Goal: Ability to maintain appropriate glucose levels will improve Outcome: Progressing   Problem: Nutritional: Goal: Maintenance of adequate nutrition will improve Outcome: Progressing   Problem: Skin Integrity: Goal: Risk for impaired skin integrity will decrease Outcome: Progressing   Problem: Tissue Perfusion: Goal: Adequacy of tissue perfusion will improve Outcome: Progressing   Problem: Education: Goal: Knowledge of General Education information will improve Description: Including pain rating scale, medication(s)/side effects and non-pharmacologic comfort measures Outcome: Progressing   Problem: Clinical Measurements: Goal: Will remain free from infection Outcome: Progressing Goal: Diagnostic test results will improve Outcome: Progressing Goal: Respiratory complications will improve Outcome: Progressing Goal: Cardiovascular complication will be avoided Outcome: Progressing   Problem: Activity: Goal: Risk for activity intolerance will decrease Outcome: Progressing

## 2023-06-04 NOTE — Progress Notes (Signed)
 Patient Name: [name] Date of Encounter: 06/04/2023 [facility] Cardiologist: Rollene Rotunda, MD   Interval Summary  .    Feeling better but not back to baseline. She still appreciates fluid retention in her abdomen and flanks. Reports having good diuresis with significant improvement in symptoms however.  Vital Signs .    Vitals:   06/04/23 0000 06/04/23 0317 06/04/23 0750 06/04/23 0800  BP: 113/81 116/73  102/72  Pulse: 93 83 85 83  Resp: 18 (!) 24 20 20   Temp:  98 F (36.7 C)  98 F (36.7 C)  TempSrc:  Oral  Oral  SpO2: 94% 93% (!) 88% 95%  Weight:      Height:       No intake or output data in the 24 hours ending 06/04/23 1153     05/31/2023   12:37 AM 06/03/2022    5:00 AM 06/02/2022    5:00 AM  Last 3 Weights  Weight (lbs) 256 lb 6.3 oz 256 lb 6.3 oz 253 lb 8.5 oz  Weight (kg) 116.3 kg 116.3 kg 115 kg      Telemetry/ECG    Atrial fibrillation heart rates between 70-100.- Personally Reviewed  CV Studies    Echocardiogram 05/31/2023 1. Abnormal septal motion . Left ventricular ejection fraction, by  estimation, is 55%. The left ventricle has normal function. The left  ventricle has no regional wall motion abnormalities. There is mild left  ventricular hypertrophy. Left ventricular  diastolic parameters are indeterminate.   2. Right ventricular systolic function is normal. The right ventricular  size is normal.   3. Left atrial size was severely dilated.   4. Right atrial size was moderately dilated.   5. Rheuamtic MV with mean gradient 14 peak 22.4 mmHg at HR 83 bpm and MVA  1.03 cm2. The mitral valve is rheumatic. Trivial mitral valve  regurgitation. Moderate to severe mitral stenosis.   6. The aortic valve is tricuspid. There is moderate calcification of the  aortic valve. There is moderate thickening of the aortic valve. Aortic  valve regurgitation is mild. Aortic valve sclerosis is present, with no  evidence of aortic valve stenosis.   7.  The inferior vena cava is normal in size with greater than 50%  respiratory variability, suggesting right atrial pressure of 3 mmHg.   Physical Exam .   GEN: No acute distress.  On supplemental oxygen Neck: No JVD Cardiac: IRRR, no murmurs Respiratory: Crackles in lower bases.  GI: Soft, nontender, non-distended  MS: No edema in ankles. Difficult to assess dependent edema in flanks/abdomen due to obesity  Patient Profile    [name] is a 78 y.o. female has hx of heart failure with mildly reduced EF, Atrial fibrillation on warfarin, mitral stenosis, CVA, DVT/PE, lupus anticoagulant syndrome, depression.  Currently being evaluated for flash pulmonary edema.  Cardiology asked to see due to A-fib and CHF.  Assessment & Plan .     Chronic HFpEF Pulmonary edema with hypoxic respiratory failure Initially required BiPAP now titrated down to supplemental oxygen.  Echo previously mildly reduced on previous echocardiogram but this admission shows preserved EF 55% with normal RV function.  Severely dilated LA.  I think she still has some fluid though difficult to assess due to body habitus and also with some rales from pulmonary disease. There still appears to be some fluid on CXR continue Entresto 49-51 mg, spironolactone 25 mg  monitor K+, replace as needed.   Consider SGLT2 inhibitor  at discharge. Incentive spirometer  Atrial fibrillation Permanent might be a more appropriate definition does not seem to have any attempts for restoration of NSR.  Initially presented RVR but heart rates well-controlled now in the 70s to 80s. Continue warfarin per pharmacy.  Continue with Lopressor 25 mg twice daily TSH normal. Rhythm control would be very difficult with mitral stenosis and severe LA enlargement.  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  Valve area 1.03.  Not felt to be a surgical candidate.  Elevated troponins 135-125.  No anginal complaints, not consistent with ACS.   On warfarin  Lupus coagulant disorder/DVT/PE   For questions or updates, please contact [facility] Please consult [email or web address] for contact info under        Signed, Maurice Small, MD

## 2023-06-04 NOTE — Progress Notes (Signed)
 [facility] PROGRESS NOTE   AMANDEEP HOGSTON WGN:[id number] DOB: 05-12-1945 DOA: 05/31/2023  PCP: Marden Noble, MD (Inactive)  Brief History: 78 yo female with the past medical history of hypertension, heart failure, atrial fibrillation, mitral stenosis, history of CVA, obesity and lupus hyper-coagulant who presented with worsening dyspnea.  Chest x-ray suggested pulmonary edema.  Patient was hospitalized for further management.     Consultants: Cardiology  Procedures: None yet    Subjective/Interval History: Patient mentions that she is improving but still short of breath.  No chest pain.  No nausea or vomiting.    Assessment/Plan:  Acute on chronic diastolic CHF/mitral stenosis Echocardiogram with preserved LV systolic function with EF 55%, mild LVH. RV systolic function is preserved. LA with severe dilatation, RA with moderate dilatation, moderate to severe mitral stenosis , mild TR.  Diuretics per cardiology Seems to have diuresed.  No Daily weight measurements noted. Patient noted to be on metoprolol, Aldactone and Entresto along with Lasix, will monitor closely blood pressure on the lower side.  Will change Lasix to once a day. Cardiology continues to follow.  Clinically improved we will continue to monitor.   Acute hypoxemic respiratory failure due to acute cardiogenic pulmonary edema along with possible bronchitis/early community-acquired pneumonia. Was on BiPAP initially.  Now off of it.   Does not use oxygen at home. improved with diuretics and antibiotics, advance activity titrate down oxygen,   Elevated troponin This is likely due to heart failure exacerbation, ruled out acute coronary syndrome.    Chronic atrial fibrillation with RVR (HCC) This is in the setting of mitral stenosis.   Continue rate control with metoprolol and anticoagulation with warfarin  INR is 2.4  Continue telemetry monitoring.    Essential hypertension Blood pressure is reasonably  well-controlled.  Noted to be on furosemide, metoprolol, Entresto, spironolactone.   CKD stage 3a, GFR 45-59 ml/min (HCC) Renal function is stable.  Monitor urine output.  Avoid nephrotoxic agents.    Hypokalemia Potassium level has improved.  Will give additional dose today.   History of lupus anticoagulant disorder History of DVT and PE Continue anticoagulation with warfarin    Obesity, class 3 Estimated body mass index is 42.67 kg/m as calculated from the following:   Height as of this encounter: 5\' 5"  (1.651 m).   Weight as of this encounter: 116.3 kg.  DVT Prophylaxis: Anticoagulated with warfarin Code Status: Full code Family Communication: Discussed with patient Disposition Plan: Start mobilizing.  Anticipate return home when improved      Medications: Scheduled:  doxycycline  100 mg Oral Q12H   furosemide  40 mg Intravenous BID   guaiFENesin  600 mg Oral BID   insulin aspart  0-9 Units Subcutaneous Q4H   metoprolol tartrate  25 mg Oral BID   sacubitril-valsartan  1 tablet Oral BID   sodium chloride flush  3 mL Intravenous Q12H   spironolactone  25 mg Oral Daily   warfarin  3 mg Oral Once per day on Sunday Monday Wednesday Friday   And   warfarin  4.5 mg Oral Once per day on Tuesday Thursday Saturday   Warfarin - Pharmacist Dosing Inpatient   Does not apply q1600   Continuous:  cefTRIAXone (ROCEPHIN)  IV 2 g (06/03/23 2040)   HQI:[id number] **OR** acetaminophen, albuterol, FLUoxetine, guaiFENesin, ondansetron (ZOFRAN) IV  Antibiotics: Anti-infectives (From admission, onward)    Start     Dose/Rate Route Frequency Ordered Stop   05/31/23 2200  cefTRIAXone (ROCEPHIN) 2 g  in sodium chloride 0.9 % 100 mL IVPB        2 g 200 mL/hr over 30 Minutes Intravenous Every 24 hours 05/31/23 2119 06/05/23 2159   05/31/23 2200  doxycycline (VIBRA-TABS) tablet 100 mg        100 mg Oral Every 12 hours 05/31/23 2123 06/05/23 2159   05/31/23 2130  doxycycline (VIBRAMYCIN)  100 mg in sodium chloride 0.9 % 250 mL IVPB  Status:  Discontinued        100 mg 125 mL/hr over 120 Minutes Intravenous Every 12 hours 05/31/23 2120 05/31/23 2123       Objective:  Vital Signs  Vitals:   06/04/23 0000 06/04/23 0317 06/04/23 0750 06/04/23 0800  BP: 113/81 116/73  102/72  Pulse: 93 83 85 83  Resp: 18 (!) 24 20 20   Temp:  98 F (36.7 C)  98 F (36.7 C)  TempSrc:  Oral  Oral  SpO2: 94% 93% (!) 88% 95%  Weight:      Height:       No intake or output data in the 24 hours ending 06/04/23 1051  Filed Weights   05/31/23 0037  Weight: 116.3 kg    Exam  Awake Alert, No new F.N deficits, Normal affect [place].AT,PERRAL Supple Neck, No JVD,   Symmetrical Chest wall movement, Good air movement bilaterally, CTAB RRR,No Gallops, Rubs or new Murmurs,  +ve B.Sounds, Abd Soft, No tenderness,   No Cyanosis, Clubbing or edema      Lab Results:  Data Reviewed: I have personally reviewed following labs and reports of the imaging studies  CBC: Recent Labs  Lab 05/31/23 0020 05/31/23 0113 06/01/23 0614 06/02/23 0532 06/03/23 0545 06/04/23 0538  WBC 11.5*  --  18.0* 15.5* 10.8* 10.5  HGB 14.7  16.3* 15.3* 14.1 13.7 15.0 15.3*  HCT 46.3*  48.0* 45.0 43.5 42.3 46.0 46.8*  MCV 101.5*  --  98.9 98.8 96.4 98.1  PLT 197  --  146* 146* 166 163    Basic Metabolic Panel: Recent Labs  Lab 05/31/23 0020 05/31/23 0113 06/01/23 0614 06/02/23 0532 06/03/23 0545 06/04/23 0538 06/04/23 0835  NA 137  136 138 137 135 135 139  --   K 4.8  4.8 4.2 3.8 3.4* 3.6 3.8  --   CL 98  --  97* 96* 94* 95*  --   CO2 22  --  28 30 29  32  --   GLUCOSE 295*  --  131* 101* 112* 108*  --   BUN 17  --  25* 25* 28* 35*  --   CREATININE 1.23*  --  1.17* 0.97 1.07* 1.15*  --   CALCIUM 8.8*  --  8.9 8.2* 8.8* 8.8*  --   MG  --   --   --  1.8 1.8  --  2.0    GFR: Estimated Creatinine Clearance: 52.2 mL/min (A) (by C-G formula based on SCr of 1.15 mg/dL (H)).  Liver Function  Tests: Recent Labs  Lab 05/31/23 0020  AST 26  ALT 19  ALKPHOS 100  BILITOT 1.3*  PROT 8.0  ALBUMIN 3.4*    Coagulation Profile: Recent Labs  Lab 05/31/23 0020 06/01/23 0614 06/02/23 0532 06/03/23 0545 06/04/23 0538  INR 2.3* 2.4* 2.8* 2.6* 2.9*     CBG: Recent Labs  Lab 06/03/23 1628 06/03/23 1944 06/03/23 2331 06/04/23 0316 06/04/23 0816  GLUCAP 72 137* 121* 146* 123*     Recent Results (from the past 240 hours)  Culture, blood (Routine X 2) w Reflex to ID Panel     Status: None (Preliminary result)   Collection Time: 05/31/23  7:40 AM   Specimen: BLOOD LEFT HAND  Result Value Ref Range Status   Specimen Description BLOOD LEFT HAND  Final   Special Requests   Final    BOTTLES DRAWN AEROBIC AND ANAEROBIC Blood Culture adequate volume   Culture   Final    NO GROWTH 4 DAYS Performed at [facility], [address] [place], [place] [address]    Report Status PENDING  Incomplete  Resp panel by RT-PCR (RSV, Flu A&B, Covid) Anterior Nasal Swab     Status: None   Collection Time: 05/31/23  7:42 AM   Specimen: Anterior Nasal Swab  Result Value Ref Range Status   SARS Coronavirus 2 by RT PCR NEGATIVE NEGATIVE Final   Influenza A by PCR NEGATIVE NEGATIVE Final   Influenza B by PCR NEGATIVE NEGATIVE Final    Comment: (NOTE) The Xpert Xpress SARS-CoV-2/FLU/RSV plus assay is intended as an aid in the diagnosis of influenza from Nasopharyngeal swab specimens and should not be used as a sole basis for treatment. Nasal washings and aspirates are unacceptable for Xpert Xpress SARS-CoV-2/FLU/RSV testing.  Fact Sheet for Patients: BloggerCourse.com  Fact Sheet for Healthcare Providers: SeriousBroker.it  This test is not yet approved or cleared by the [place] FDA and has been authorized for detection and/or diagnosis of SARS-CoV-2 by FDA under an Emergency Use Authorization (EUA). This EUA will  remain in effect (meaning this test can be used) for the duration of the COVID-19 declaration under Section 564(b)(1) of the Act, 21 U.S.C. section 360bbb-3(b)(1), unless the authorization is terminated or revoked.     Resp Syncytial Virus by PCR NEGATIVE NEGATIVE Final    Comment: (NOTE) Fact Sheet for Patients: BloggerCourse.com  Fact Sheet for Healthcare Providers: SeriousBroker.it  This test is not yet approved or cleared by the [place] FDA and has been authorized for detection and/or diagnosis of SARS-CoV-2 by FDA under an Emergency Use Authorization (EUA). This EUA will remain in effect (meaning this test can be used) for the duration of the COVID-19 declaration under Section 564(b)(1) of the Act, 21 U.S.C. section 360bbb-3(b)(1), unless the authorization is terminated or revoked.  Performed at [facility], [address] [place], [place] [address]   Culture, blood (Routine X 2) w Reflex to ID Panel     Status: None (Preliminary result)   Collection Time: 05/31/23  8:30 AM   Specimen: BLOOD RIGHT WRIST  Result Value Ref Range Status   Specimen Description BLOOD RIGHT WRIST  Final   Special Requests   Final    BOTTLES DRAWN AEROBIC AND ANAEROBIC Blood Culture adequate volume   Culture   Final    NO GROWTH 4 DAYS Performed at [facility], [address], [place], [place] [address]    Report Status PENDING  Incomplete      Radiology Studies: DG CHEST PORT 1 VIEW Result Date: 06/04/2023 CLINICAL DATA:  [id number] Acute CHF (congestive heart failure) (HCC) [id number] EXAM: PORTABLE CHEST 1 VIEW COMPARISON:  June 02, 2023 FINDINGS: The cardiomediastinal silhouette is unchanged and enlarged in contour. No pleural effusion. No pneumothorax. Improvement in reticular opacities compared to prior. Some degree of persistent perihilar vascular congestion and azygous fullness. IMPRESSION: Improving pulmonary edema.  Electronically Signed   By: Meda Klinefelter M.D.   On: 06/04/2023 10:19  LOS: 4 days   Signature  -    Susa Raring M.D on 06/04/2023 at 10:51 AM   -  To page go to [email or web address]

## 2023-06-04 NOTE — Progress Notes (Addendum)
 PHARMACY - ANTICOAGULATION CONSULT NOTE  Pharmacy Consult for warfarin Indication: atrial fibrillation and DVT  Allergies  Allergen Reactions   Heparin Hives    Patient attests severe allergy- rash all over, severe itching, unable to take heparin  Can take lovenox   Other Itching and Rash    "Sheets and Linens used by [name]" Mainly while getting the heparin    Patient Measurements: Height: 5\' 5"  (165.1 cm) Weight: 116.3 kg (256 lb 6.3 oz) IBW/kg (Calculated) : 57   Vital Signs: Temp: 98 F (36.7 C) (03/09 0317) Temp Source: Oral (03/09 0317) BP: 116/73 (03/09 0317) Pulse Rate: 85 (03/09 0750)  Labs: Recent Labs    06/02/23 0532 06/03/23 0545 06/04/23 0538  HGB 13.7 15.0 15.3*  HCT 42.3 46.0 46.8*  PLT 146* 166 163  LABPROT 29.9* 28.3* 30.8*  INR 2.8* 2.6* 2.9*  CREATININE 0.97 1.07* 1.15*    Estimated Creatinine Clearance: 52.2 mL/min (A) (by C-G formula based on SCr of 1.15 mg/dL (H)).   Medical History: Past Medical History:  Diagnosis Date   (HFpEF) heart failure with preserved ejection fraction (HCC)    a. 06/2008 Echo: EF 55-60%, mild LVH, triv AI, mild to mod MS, mod to sev dil LA, mildly dil RA.   Acute right MCA stroke (HCC)    a. 06/04/2010 Embolic stroke treated with TPA and thrombectomy with hemorrhagic transformation; Carotids 3/12 negative for ICA stenosis.   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 08/2005 - s/p IVC filter.   CHF (congestive heart failure) (HCC)    Depression    Embolus of femoral artery (HCC)    a. 06/2008 s/p bilateral embolectomies.   History of DVT (deep vein thrombosis)    a. s/p IVC filter.   HIT (heparin-induced thrombocytopenia) (HCC)    HTN (hypertension)    Lupus anticoagulant disorder (HCC)    Mitral stenosis    moderate-severe 09/04/20 echo   Obesity, unspecified    Permanent atrial fibrillation (HCC)    a. On chronic Coumadin - INRs followed by PCP;  b. CHA2DS2VASc = 5.   Popliteal artery embolism, right (HCC)    a. 01/2017 in the setting of subRx INR-->s/p embolectomy.    Assessment: 40 yoF with PHM of atrial fibrillaiton and PE on warfarin PTA (admit INR 2.3) last dose 3/4. CBC stable. Pharmacy consulted to manage warfarin. Will continue home regimen for now since INR at goal.   INR therapeutic @ 2.9, however on higher end of goal, today. CBC WNL - hgb 15, plts 163. Unclear if patient's percentage of meals eaten but pt reports appetite has not been the best. She doesn't want breakfast this morning. Will continue to monitor to adjust regimen if needed.   Home warfarin: 3mg  Mon, Wed, Fri, Sun and 4.5mg  all other days   Goal of Therapy:  Heparin level 0.3-0.7 units/ml Monitor platelets by anticoagulation protocol: Yes   Plan:  Continue warfarin per home regimen Monitor INR and CBC  Roslyn Smiling, PharmD PGY1 Pharmacy Resident 06/04/2023 8:30 AM

## 2023-06-05 DIAGNOSIS — I5033 Acute on chronic diastolic (congestive) heart failure: Secondary | ICD-10-CM | POA: Diagnosis not present

## 2023-06-05 LAB — CULTURE, BLOOD (ROUTINE X 2)
Culture: NO GROWTH
Culture: NO GROWTH
Special Requests: ADEQUATE
Special Requests: ADEQUATE

## 2023-06-05 LAB — PROTIME-INR
INR: 4.2 (ref 0.8–1.2)
INR: 4.2 (ref 0.8–1.2)
Prothrombin Time: 41 s — ABNORMAL HIGH (ref 11.4–15.2)
Prothrombin Time: 40.4 s — ABNORMAL HIGH (ref 11.4–15.2)

## 2023-06-05 LAB — BASIC METABOLIC PANEL
Anion gap: 15 (ref 5–15)
BUN: 38 mg/dL — ABNORMAL HIGH (ref 8–23)
CO2: 31 mmol/L (ref 22–32)
Calcium: 9.2 mg/dL (ref 8.9–10.3)
Chloride: 93 mmol/L — ABNORMAL LOW (ref 98–111)
Creatinine, Ser: 1.18 mg/dL — ABNORMAL HIGH (ref 0.44–1.00)
GFR, Estimated: 48 mL/min — ABNORMAL LOW (ref >60)
Glucose, Bld: 108 mg/dL — ABNORMAL HIGH (ref 70–99)
Potassium: 3.9 mmol/L (ref 3.5–5.1)
Sodium: 139 mmol/L (ref 135–145)

## 2023-06-05 LAB — CBC WITH DIFFERENTIAL/PLATELET
Abs Immature Granulocytes: 0.04 10*3/uL (ref 0.00–0.07)
Basophils Absolute: 0.1 10*3/uL (ref 0.0–0.1)
Basophils Relative: 1 %
Eosinophils Absolute: 0.5 10*3/uL (ref 0.0–0.5)
Eosinophils Relative: 6 %
HCT: 48.6 % — ABNORMAL HIGH (ref 36.0–46.0)
Hemoglobin: 15.8 g/dL — ABNORMAL HIGH (ref 12.0–15.0)
Immature Granulocytes: 0 %
Lymphocytes Relative: 12 %
Lymphs Abs: 1.1 10*3/uL (ref 0.7–4.0)
MCH: 31.9 pg (ref 26.0–34.0)
MCHC: 32.5 g/dL (ref 30.0–36.0)
MCV: 98 fL (ref 80.0–100.0)
Monocytes Absolute: 0.9 10*3/uL (ref 0.1–1.0)
Monocytes Relative: 10 %
Neutro Abs: 6.3 10*3/uL (ref 1.7–7.7)
Neutrophils Relative %: 71 %
Platelets: 182 10*3/uL (ref 150–400)
RBC: 4.96 MIL/uL (ref 3.87–5.11)
RDW: 13.8 % (ref 11.5–15.5)
WBC: 9 10*3/uL (ref 4.0–10.5)
nRBC: 0 % (ref 0.0–0.2)

## 2023-06-05 LAB — GLUCOSE, CAPILLARY
Glucose-Capillary: 113 mg/dL — ABNORMAL HIGH (ref 70–99)
Glucose-Capillary: 112 mg/dL — ABNORMAL HIGH (ref 70–99)
Glucose-Capillary: 186 mg/dL — ABNORMAL HIGH (ref 70–99)
Glucose-Capillary: 135 mg/dL — ABNORMAL HIGH (ref 70–99)
Glucose-Capillary: 176 mg/dL — ABNORMAL HIGH (ref 70–99)
Glucose-Capillary: 90 mg/dL (ref 70–99)

## 2023-06-05 LAB — MAGNESIUM: Magnesium: 2.1 mg/dL (ref 1.7–2.4)

## 2023-06-05 LAB — C-REACTIVE PROTEIN: CRP: 8.6 mg/dL — ABNORMAL HIGH (ref <1.0)

## 2023-06-05 LAB — BRAIN NATRIURETIC PEPTIDE: B Natriuretic Peptide: 83.7 pg/mL (ref 0.0–100.0)

## 2023-06-05 LAB — PROCALCITONIN: Procalcitonin: 1.66 ng/mL

## 2023-06-05 LAB — PHOSPHORUS: Phosphorus: 3.8 mg/dL (ref 2.5–4.6)

## 2023-06-05 NOTE — Inpatient Diabetes Management (Signed)
 Inpatient Diabetes Program Recommendations  AACE/ADA: New Consensus Statement on Inpatient Glycemic Control (2015)  Target Ranges:  Prepandial:   less than 140 mg/dL      Peak postprandial:   less than 180 mg/dL (1-2 hours)      Critically ill patients:  140 - 180 mg/dL   Lab Results  Component Value Date   GLUCAP 112 (H) 06/05/2023   HGBA1C 5.8 (H) 05/31/2023    Review of Glycemic Control  Latest Reference Range & Units 06/04/23 19:40 06/04/23 23:07 06/04/23 23:41 06/05/23 03:16 06/05/23 08:55  Glucose-Capillary 70 - 99 mg/dL 696 (H) 60 (L) 96 295 (H) 112 (H)   Diabetes history: None Outpatient Diabetes medications:  None Current orders for Inpatient glycemic control:  Novolog 0-9 units q 4 hours Inpatient Diabetes Program Recommendations:   Consider changing Novolog correction to tid with meals and HS scale.   Thanks,  Lorenza Cambridge, RN, BC-ADM Inpatient Diabetes Coordinator Pager [phone]  (8a-5p)

## 2023-06-05 NOTE — TOC Progression Note (Signed)
 Transition of Care [facility]) - Progression Note    Patient Details  Name: [name] MRN: [id number] Date of Birth: Apr 07, 1945  Transition of Care [facility]) CM/SW Contact  Ronny Bacon, RN Phone Number: 06/05/2023, 1:02 PM  Clinical Narrative: [facility] PT/OT recommedations noted from therapy, Referral sent to [facility] with [facility] and accepted. Contact information placed on AVS.  DME BSC/3:1 recommended by therapy, ordered through [facility] with [employer].      Expected Discharge Plan: Home w Home Health Services Barriers to Discharge: Continued Medical Work up  Expected Discharge Plan and Services                                   HH Arranged: PT, OT [facility] Agency: [facility] Date [facility] Agency Contacted: 06/05/23 Time HH Agency Contacted: 1249 Representative spoke with at [facility] Agency: Kandee Keen   Social Determinants of Health (SDOH) Interventions SDOH Screenings   Food Insecurity: No Food Insecurity (06/01/2023)  Housing: Low Risk  (06/01/2023)  Transportation Needs: No Transportation Needs (06/01/2023)  Utilities: Not At Risk (06/01/2023)  Social Connections: Moderately Isolated (06/01/2023)  Tobacco Use: Low Risk  (06/01/2023)    Readmission Risk Interventions     No data to display

## 2023-06-05 NOTE — Progress Notes (Signed)
 Patient Name: [name] Date of Encounter: 06/05/2023 [facility] Cardiologist: Rollene Rotunda, MD   Interval Summary  .    She is not at baseline today either.  She was getting ready to work with PT  Vital Signs .    Vitals:   06/04/23 1937 06/04/23 2022 06/04/23 2318 06/05/23 0314  BP:  (!) 129/95 128/88 (!) 128/95  Pulse:  100 72 77  Resp:  18 16 17   Temp: 98 F (36.7 C)  97.9 F (36.6 C) 97.8 F (36.6 C)  TempSrc: Oral  Oral Oral  SpO2: 94% 97% 96% 94%  Weight:      Height:        Intake/Output Summary (Last 24 hours) at 06/05/2023 0853 Last data filed at 06/04/2023 1825 Gross per 24 hour  Intake 240 ml  Output 650 ml  Net -410 ml       05/31/2023   12:37 AM 06/03/2022    5:00 AM 06/02/2022    5:00 AM  Last 3 Weights  Weight (lbs) 256 lb 6.3 oz 256 lb 6.3 oz 253 lb 8.5 oz  Weight (kg) 116.3 kg 116.3 kg 115 kg      Telemetry/ECG    Atrial fibrillation heart rates controlled < 110.- Personally Reviewed  CV Studies    Echocardiogram 05/31/2023 1. Abnormal septal motion . Left ventricular ejection fraction, by  estimation, is 55%. The left ventricle has normal function. The left  ventricle has no regional wall motion abnormalities. There is mild left  ventricular hypertrophy. Left ventricular  diastolic parameters are indeterminate.   2. Right ventricular systolic function is normal. The right ventricular  size is normal.   3. Left atrial size was severely dilated.   4. Right atrial size was moderately dilated.   5. Rheuamtic MV with mean gradient 14 peak 22.4 mmHg at HR 83 bpm and MVA  1.03 cm2. The mitral valve is rheumatic. Trivial mitral valve  regurgitation. Moderate to severe mitral stenosis.   6. The aortic valve is tricuspid. There is moderate calcification of the  aortic valve. There is moderate thickening of the aortic valve. Aortic  valve regurgitation is mild. Aortic valve sclerosis is present, with no  evidence of aortic valve  stenosis.   7. The inferior vena cava is normal in size with greater than 50%  respiratory variability, suggesting right atrial pressure of 3 mmHg.   Physical Exam .   GEN: No acute distress.  On supplemental oxygen Neck: No JVD at 90 degrees Cardiac: IRRR, no murmurs Respiratory: Crackles in lower bases.  GI: Soft, nontender, non-distended  MS: No edema in ankles.   Patient Profile    [name] is a 78 y.o. female has hx of heart failure with mildly reduced EF, Atrial fibrillation on warfarin, mitral stenosis, CVA, DVT/PE, lupus anticoagulant syndrome, depression.  Currently being evaluated for flash pulmonary edema.  Cardiology asked to see due to A-fib and CHF.  Assessment & Plan .     Chronic HFpEF Pulmonary edema with hypoxic respiratory failure Initially required BiPAP now titrated down to supplemental oxygen.  Echo previously mildly reduced on previous echocardiogram but this admission shows preserved EF 55% with normal RV function.  Severely dilated LA.  She still requires 3 L of O2; breath sounds mildly diminished, recommend continuing IV diuresis continue Entresto 49-51 mg, spironolactone 25 mg  monitor K+, replace as needed.   Consider SGLT2 inhibitor at discharge. Incentive spirometer  Permanent Atrial fibrillation Permanent  might be a more appropriate definition does not seem to have any attempts for restoration of NSR.  Initially presented RVR but heart rates well-controlled now in the 70s to 80s. Continue warfarin per pharmacy.  Continue with Lopressor 25 mg twice daily TSH normal. Rhythm control would be very difficult with mitral stenosis and severe LA enlargement.  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  Valve area 1.03.  Not felt to be a surgical candidate.  Elevated troponins 135-125.  No anginal complaints, not consistent with ACS.  On warfarin  Lupus coagulant disorder/DVT/PE   For questions or updates, please contact [facility] Please consult [email or web address] for contact info under        Signed, Tambria Pfannenstiel, Alben Spittle, MD

## 2023-06-05 NOTE — Progress Notes (Signed)
 Occupational Therapy Treatment Patient Details Name: [name] MRN: [id number] DOB: 09-12-1945 Today's Date: 06/05/2023   History of present illness Pt is a 78 y/o F presenting to ED On 3/5 wtih vomiting and SOB, admitted for acute on chronic CHF and possible CAP. PMH includes HTN, heart failure with mildly reduced EF, A fib on chronic anticoagulation, mitral stenosis, DVT, PT, HIT lupus atincoagulation disorder, depression, obesity   OT comments  Pt progressing toward goals, able to ambulate to bathroom for BM this session, overall needing min-mod A for ADLs, min A for bed mobility and min A for transfers with RW. Pt with poor wave pleth reading in 80s on 3L, incr to 90s with good pleth. Pt presenting with impairments listed below, will follow acutely. Continue to recommend HHOT at d/c.       If plan is discharge home, recommend the following:  A little help with walking and/or transfers;A little help with bathing/dressing/bathroom;Assistance with cooking/housework;Direct supervision/assist for medications management;Direct supervision/assist for financial management;Assist for transportation;Help with stairs or ramp for entrance   Equipment Recommendations  BSC/3in1    Recommendations for Other Services PT consult    Precautions / Restrictions Precautions Precautions: Fall Precaution/Restrictions Comments: watch O2 Restrictions Weight Bearing Restrictions Per Provider Order: No       Mobility Bed Mobility Overal bed mobility: Needs Assistance Bed Mobility: Sit to Supine     Supine to sit: Min assist          Transfers Overall transfer level: Needs assistance Equipment used: Rolling walker (2 wheels) Transfers: Sit to/from Stand, Bed to chair/wheelchair/BSC Sit to Stand: Min assist           General transfer comment: cues to push from bed vs pull from RW     Balance Overall balance assessment: Needs assistance Sitting-balance support: Feet  supported Sitting balance-Leahy Scale: Good     Standing balance support: Reliant on assistive device for balance, During functional activity Standing balance-Leahy Scale: Poor Standing balance comment: reliant on RW support                           ADL either performed or assessed with clinical judgement   ADL Overall ADL's : Needs assistance/impaired     Grooming: Wash/dry hands;Contact guard assist;Standing               Lower Body Dressing: Moderate assistance Lower Body Dressing Details (indicate cue type and reason): reaches down to pull up socks Toilet Transfer: Minimal assistance;Ambulation;Rolling walker (2 wheels);Regular Toilet   Toileting- Clothing Manipulation and Hygiene: Moderate assistance Toileting - Clothing Manipulation Details (indicate cue type and reason): for posterior pericare     Functional mobility during ADLs: Minimal assistance;Rolling walker (2 wheels)      Extremity/Trunk Assessment Upper Extremity Assessment Upper Extremity Assessment: Generalized weakness   Lower Extremity Assessment Lower Extremity Assessment: Defer to PT evaluation        Vision   Vision Assessment?: No apparent visual deficits   Perception Perception Perception: Not tested   Praxis Praxis Praxis: Not tested   Communication Communication Communication: No apparent difficulties   Cognition Arousal: Alert Behavior During Therapy: WFL for tasks assessed/performed Cognition: No apparent impairments                               Following commands: Intact        Cueing  Exercises      Shoulder Instructions       General Comments SpO2 with poor pleth, O2 reading in 80s on 3L, incr to 90s when reading well    Pertinent Vitals/ Pain       Pain Assessment Pain Assessment: No/denies pain  Home Living                                          Prior Functioning/Environment               Frequency  Min 2X/week        Progress Toward Goals  OT Goals(current goals can now be found in the care plan section)  Progress towards OT goals: Progressing toward goals  Acute Rehab OT Goals Patient Stated Goal: none stated OT Goal Formulation: With patient Time For Goal Achievement: 06/17/23 Potential to Achieve Goals: Good ADL Goals Pt Will Perform Upper Body Dressing: Independently;sitting Pt Will Perform Lower Body Dressing: Independently;sitting/lateral leans;sit to/from stand Pt Will Transfer to Toilet: Independently;ambulating;regular height toilet Additional ADL Goal #2: pt will verbalize x3 energy conservation strategies in prep for ADLs  Plan      Co-evaluation                 AM-PAC OT "6 Clicks" Daily Activity     Outcome Measure   Help from another person eating meals?: None Help from another person taking care of personal grooming?: A Little Help from another person toileting, which includes using toliet, bedpan, or urinal?: A Little Help from another person bathing (including washing, rinsing, drying)?: A Lot Help from another person to put on and taking off regular upper body clothing?: A Little Help from another person to put on and taking off regular lower body clothing?: A Lot 6 Click Score: 17    End of Session Equipment Utilized During Treatment: Gait belt;Rolling walker (2 wheels);Oxygen  OT Visit Diagnosis: Unsteadiness on feet (R26.81);Other abnormalities of gait and mobility (R26.89);Muscle weakness (generalized) (M62.81)   Activity Tolerance Patient tolerated treatment well   Patient Left in chair;with call bell/phone within reach;with chair alarm set   Nurse Communication Mobility status;Other (comment) (needs new purewick and sacral foam)        Time: 8295-6213 OT Time Calculation (min): 39 min  Charges: OT General Charges $OT Visit: 1 Visit OT Treatments $Self Care/Home Management : 38-52 mins  Melody Fila, OTD,  OTR/L SecureChat Preferred Acute Rehab [phone]   [name] 06/05/2023, 9:40 AM

## 2023-06-05 NOTE — Progress Notes (Signed)
 PHARMACY - ANTICOAGULATION CONSULT NOTE  Pharmacy Consult for warfarin Indication: atrial fibrillation and DVT  Allergies  Allergen Reactions   Heparin Hives    Patient attests severe allergy- rash all over, severe itching, unable to take heparin  Can take lovenox   Other Itching and Rash    "Sheets and Linens used by [name]" Mainly while getting the heparin    Patient Measurements: Height: 5\' 5"  (165.1 cm) Weight: 116.3 kg (256 lb 6.3 oz) IBW/kg (Calculated) : 57   Vital Signs: Temp: 97.8 F (36.6 C) (03/10 0314) Temp Source: Oral (03/10 0314) BP: 128/95 (03/10 0314) Pulse Rate: 77 (03/10 0314)  Labs: Recent Labs    06/03/23 0545 06/04/23 0538 06/05/23 0512  HGB 15.0 15.3* 15.8*  HCT 46.0 46.8* 48.6*  PLT 166 163 182  LABPROT 28.3* 30.8* 41.0*  INR 2.6* 2.9* 4.2*  CREATININE 1.07* 1.15* 1.18*    Estimated Creatinine Clearance: 50.9 mL/min (A) (by C-G formula based on SCr of 1.18 mg/dL (H)).   Medical History: Past Medical History:  Diagnosis Date   (HFpEF) heart failure with preserved ejection fraction (HCC)    a. 06/2008 Echo: EF 55-60%, mild LVH, triv AI, mild to mod MS, mod to sev dil LA, mildly dil RA.   Acute right MCA stroke (HCC)    a. 06/04/2010 Embolic stroke treated with TPA and thrombectomy with hemorrhagic transformation; Carotids 3/12 negative for ICA stenosis.   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 08/2005 - s/p IVC filter.   CHF (congestive heart failure) (HCC)    Depression    Embolus of femoral artery (HCC)    a. 06/2008 s/p bilateral embolectomies.   History of DVT (deep vein thrombosis)    a. s/p IVC filter.   HIT (heparin-induced thrombocytopenia) (HCC)    HTN (hypertension)    Lupus anticoagulant disorder (HCC)    Mitral stenosis    moderate-severe 09/04/20 echo   Obesity, unspecified    Permanent atrial fibrillation (HCC)    a. On chronic Coumadin - INRs followed by  PCP; b. CHA2DS2VASc = 5.   Popliteal artery embolism, right (HCC)    a. 01/2017 in the setting of subRx INR-->s/p embolectomy.    Assessment: 33 yoF with PHM of atrial fibrillaiton and PE on warfarin PTA (admit INR 2.3) last dose 3/4. CBC stable. Pharmacy consulted to manage warfarin. Will continue home regimen for now since INR at goal.   3/10 AM: INR supratherapeutic this AM at 4.2 with significant increase from 2.9>>4.2. Repeat INR confirmed this is a true level. CBC remains stable. May be elevated in setting of ceftriaxone and doxycycline use. Per Rn, patient has been eating well, though this may be improved from days prior.   Home warfarin: 3mg  Mon, Wed, Fri, Sun and 4.5mg  all other days   Goal of Therapy:  Heparin level 0.3-0.7 units/ml Monitor platelets by anticoagulation protocol: Yes   Plan:  Hold Warfarin today  Monitor INR and CBC daily   Jani Gravel, PharmD Clinical Pharmacist  06/05/2023 8:50 AM

## 2023-06-05 NOTE — Progress Notes (Signed)
 [facility] PROGRESS NOTE   [name] VWU:[id number] DOB: 1945/04/29 DOA: 05/31/2023  PCP: Marden Noble, MD (Inactive)  Brief History: 78 yo female with the past medical history of hypertension, heart failure, atrial fibrillation, mitral stenosis, history of CVA, obesity and lupus hyper-coagulant who presented with worsening dyspnea.  Chest x-ray suggested pulmonary edema.  Patient was hospitalized for further management.     Consultants: Cardiology  Procedures: None yet    Subjective/Interval History:  Patient in bed, appears comfortable, denies any headache, no fever, no chest pain or pressure, no shortness of breath , no abdominal pain. No new focal weakness.   Assessment/Plan:  Acute on chronic diastolic CHF/mitral stenosis Echocardiogram with preserved LV systolic function with EF 55%, mild LVH. RV systolic function is preserved. LA with severe dilatation, RA with moderate dilatation, moderate to severe mitral stenosis , mild TR.  Diuretics per cardiology Patient noted to be on metoprolol, Aldactone and Entresto along with Lasix, will monitor closely blood pressure on the lower side.  Will change Lasix to once a day. Cardiology continues to follow.  Clinically significantly improved we will continue to monitor.   Acute hypoxemic respiratory failure due to acute cardiogenic pulmonary edema along with possible bronchitis/early community-acquired pneumonia. Was on BiPAP initially.  Now off of it.   Does not use oxygen at home. improved with diuretics and antibiotics, advance activity titrate down oxygen,   SpO2: 94 % O2 Flow Rate (L/min): 3 L/min FiO2 (%): 40 %   Elevated troponin This is likely due to heart failure exacerbation, ruled out acute coronary syndrome.    Chronic atrial fibrillation with RVR (HCC) This is in the setting of mitral stenosis.   Continue rate control with metoprolol and anticoagulation with warfarin  INR is 2.4  Continue telemetry  monitoring.    Essential hypertension Blood pressure is reasonably well-controlled.  Noted to be on furosemide, metoprolol, Entresto, spironolactone.   CKD stage 3a, GFR 45-59 ml/min (HCC) Renal function is stable.  Monitor urine output.  Avoid nephrotoxic agents.    Hypokalemia Potassium level has improved.  Will give additional dose today.   History of lupus anticoagulant disorder History of DVT and PE Continue anticoagulation with warfarin    Obesity, class 3 Estimated body mass index is 42.67 kg/m as calculated from the following:   Height as of this encounter: 5\' 5"  (1.651 m).   Weight as of this encounter: 116.3 kg.  DVT Prophylaxis: Anticoagulated with warfarin Code Status: Full code Family Communication: Discussed with patient Disposition Plan: Start mobilizing.  Anticipate return home when improved      Medications: Scheduled:  doxycycline  100 mg Oral Q12H   furosemide  40 mg Intravenous Daily   guaiFENesin  600 mg Oral BID   insulin aspart  0-9 Units Subcutaneous Q4H   metoprolol tartrate  25 mg Oral BID   sacubitril-valsartan  1 tablet Oral BID   sodium chloride flush  3 mL Intravenous Q12H   spironolactone  25 mg Oral Daily   warfarin  3 mg Oral Once per day on Sunday Monday Wednesday Friday   And   warfarin  4.5 mg Oral Once per day on Tuesday Thursday Saturday   Warfarin - Pharmacist Dosing Inpatient   Does not apply q1600   Continuous:  Objective:  Vital Signs  Vitals:   06/04/23 1937 06/04/23 2022 06/04/23 2318 06/05/23 0314  BP:  (!) 129/95 128/88 (!) 128/95  Pulse:  100 72 77  Resp:  18  16 17  Temp: 98 F (36.7 C)  97.9 F (36.6 C) 97.8 F (36.6 C)  TempSrc: Oral  Oral Oral  SpO2: 94% 97% 96% 94%  Weight:      Height:        Intake/Output Summary (Last 24 hours) at 06/05/2023 0903 Last data filed at 06/04/2023 1825 Gross per 24 hour  Intake 240 ml  Output 650 ml  Net -410 ml    Filed Weights   05/31/23 0037  Weight: 116.3 kg     Exam  Awake Alert, No new F.N deficits, Normal affect [place].AT,PERRAL Supple Neck, No JVD,   Symmetrical Chest wall movement, Good air movement bilaterally, few rales RRR,No Gallops, Rubs or new Murmurs,  +ve B.Sounds, Abd Soft, No tenderness,   No Cyanosis, Clubbing , no edema   Lab Results:  Data Reviewed: I have personally reviewed following labs and reports of the imaging studies Recent Labs  Lab 06/01/23 0614 06/02/23 0532 06/03/23 0545 06/04/23 0538 06/05/23 0512  WBC 18.0* 15.5* 10.8* 10.5 9.0  HGB 14.1 13.7 15.0 15.3* 15.8*  HCT 43.5 42.3 46.0 46.8* 48.6*  PLT 146* 146* 166 163 182  MCV 98.9 98.8 96.4 98.1 98.0  MCH 32.0 32.0 31.4 32.1 31.9  MCHC 32.4 32.4 32.6 32.7 32.5  RDW 13.3 13.4 13.5 13.7 13.8  LYMPHSABS  --   --   --   --  1.1  MONOABS  --   --   --   --  0.9  EOSABS  --   --   --   --  0.5  BASOSABS  --   --   --   --  0.1    Recent Labs  Lab 05/31/23 0020 05/31/23 0113 05/31/23 0800 05/31/23 0919 05/31/23 1226 06/01/23 2956 06/02/23 0532 06/03/23 0545 06/04/23 0538 06/04/23 0835 06/05/23 0512  NA 137  136   < >  --   --   --  137 135 135 139  --  139  K 4.8  4.8   < >  --   --   --  3.8 3.4* 3.6 3.8  --  3.9  CL 98  --   --   --   --  97* 96* 94* 95*  --  93*  CO2 22  --   --   --   --  28 30 29  32  --  31  ANIONGAP 17*  --   --   --   --  12 9 12 12   --  15  GLUCOSE 295*  --   --   --   --  131* 101* 112* 108*  --  108*  BUN 17  --   --   --   --  25* 25* 28* 35*  --  38*  CREATININE 1.23*  --   --   --   --  1.17* 0.97 1.07* 1.15*  --  1.18*  AST 26  --   --   --   --   --   --   --   --   --   --   ALT 19  --   --   --   --   --   --   --   --   --   --   ALKPHOS 100  --   --   --   --   --   --   --   --   --   --  BILITOT 1.3*  --   --   --   --   --   --   --   --   --   --   ALBUMIN 3.4*  --   --   --   --   --   --   --   --   --   --   CRP  --   --   --   --   --   --   --   --   --  11.4* 8.6*  PROCALCITON  --   --  12.40   --   --   --   --   --  3.26  --   --   LATICACIDVEN  --   --   --  2.0* 2.6*  --   --   --   --   --   --   INR 2.3*  --   --   --   --  2.4* 2.8* 2.6* 2.9*  --  4.2*  TSH  --   --  1.045  --   --   --   --   --   --   --   --   HGBA1C  --   --  5.8*  --   --   --   --   --   --   --   --   BNP 715.1*  --   --   --   --   --   --   --  68.5  --  83.7  MG  --   --   --   --   --   --  1.8 1.8  --  2.0 2.1  PHOS  --   --   --   --   --   --   --   --   --   --  3.8  CALCIUM 8.8*  --   --   --   --  8.9 8.2* 8.8* 8.8*  --  9.2   < > = values in this interval not displayed.      Recent Labs  Lab 05/31/23 0020 05/31/23 0800 05/31/23 0919 05/31/23 1226 06/01/23 6045 06/02/23 0532 06/03/23 0545 06/04/23 0538 06/04/23 0835 06/05/23 0512  CRP  --   --   --   --   --   --   --   --  11.4* 8.6*  PROCALCITON  --  12.40  --   --   --   --   --  3.26  --   --   LATICACIDVEN  --   --  2.0* 2.6*  --   --   --   --   --   --   INR 2.3*  --   --   --  2.4* 2.8* 2.6* 2.9*  --  4.2*  TSH  --  1.045  --   --   --   --   --   --   --   --   HGBA1C  --  5.8*  --   --   --   --   --   --   --   --   BNP 715.1*  --   --   --   --   --   --  68.5  --  83.7  MG  --   --   --   --   --  1.8 1.8  --  2.0 2.1  CALCIUM 8.8*  --   --   --  8.9 8.2* 8.8* 8.8*  --  9.2    Lab Results  Component Value Date   HGBA1C 5.8 (H) 05/31/2023   Micro Results Recent Results (from the past 240 hours)  Culture, blood (Routine X 2) w Reflex to ID Panel     Status: None (Preliminary result)   Collection Time: 05/31/23  7:40 AM   Specimen: BLOOD LEFT HAND  Result Value Ref Range Status   Specimen Description BLOOD LEFT HAND  Final   Special Requests   Final    BOTTLES DRAWN AEROBIC AND ANAEROBIC Blood Culture adequate volume   Culture   Final    NO GROWTH 4 DAYS Performed at [facility], [address], [place], [place] [address]    Report Status PENDING  Incomplete  Resp panel by RT-PCR (RSV, Flu A&B,  Covid) Anterior Nasal Swab     Status: None   Collection Time: 05/31/23  7:42 AM   Specimen: Anterior Nasal Swab  Result Value Ref Range Status   SARS Coronavirus 2 by RT PCR NEGATIVE NEGATIVE Final   Influenza A by PCR NEGATIVE NEGATIVE Final   Influenza B by PCR NEGATIVE NEGATIVE Final    Comment: (NOTE) The Xpert Xpress SARS-CoV-2/FLU/RSV plus assay is intended as an aid in the diagnosis of influenza from Nasopharyngeal swab specimens and should not be used as a sole basis for treatment. Nasal washings and aspirates are unacceptable for Xpert Xpress SARS-CoV-2/FLU/RSV testing.  Fact Sheet for Patients: BloggerCourse.com  Fact Sheet for Healthcare Providers: SeriousBroker.it  This test is not yet approved or cleared by the [place] FDA and has been authorized for detection and/or diagnosis of SARS-CoV-2 by FDA under an Emergency Use Authorization (EUA). This EUA will remain in effect (meaning this test can be used) for the duration of the COVID-19 declaration under Section 564(b)(1) of the Act, 21 U.S.C. section 360bbb-3(b)(1), unless the authorization is terminated or revoked.     Resp Syncytial Virus by PCR NEGATIVE NEGATIVE Final    Comment: (NOTE) Fact Sheet for Patients: BloggerCourse.com  Fact Sheet for Healthcare Providers: SeriousBroker.it  This test is not yet approved or cleared by the [place] FDA and has been authorized for detection and/or diagnosis of SARS-CoV-2 by FDA under an Emergency Use Authorization (EUA). This EUA will remain in effect (meaning this test can be used) for the duration of the COVID-19 declaration under Section 564(b)(1) of the Act, 21 U.S.C. section 360bbb-3(b)(1), unless the authorization is terminated or revoked.  Performed at [facility], [address] [place], [place] [address]   Culture, blood (Routine X 2) w  Reflex to ID Panel     Status: None (Preliminary result)   Collection Time: 05/31/23  8:30 AM   Specimen: BLOOD RIGHT WRIST  Result Value Ref Range Status   Specimen Description BLOOD RIGHT WRIST  Final   Special Requests   Final    BOTTLES DRAWN AEROBIC AND ANAEROBIC Blood Culture adequate volume   Culture   Final    NO GROWTH 4 DAYS Performed at [facility], [address] [place], [place] [address]    Report Status PENDING  Incomplete    Radiology Reports  DG CHEST PORT 1 VIEW Result Date: 06/04/2023 CLINICAL DATA:  [id number] Acute CHF (congestive heart failure) (HCC) [id number] EXAM: PORTABLE CHEST 1 VIEW COMPARISON:  June 02, 2023 FINDINGS:  The cardiomediastinal silhouette is unchanged and enlarged in contour. No pleural effusion. No pneumothorax. Improvement in reticular opacities compared to prior. Some degree of persistent perihilar vascular congestion and azygous fullness. IMPRESSION: Improving pulmonary edema. Electronically Signed   By: Meda Klinefelter M.D.   On: 06/04/2023 10:19       LOS: 5 days   Signature  -    Susa Raring M.D on 06/05/2023 at 9:03 AM   -  To page go to [email or web address]

## 2023-06-06 DIAGNOSIS — I5033 Acute on chronic diastolic (congestive) heart failure: Secondary | ICD-10-CM | POA: Diagnosis not present

## 2023-06-06 LAB — GLUCOSE, CAPILLARY
Glucose-Capillary: 107 mg/dL — ABNORMAL HIGH (ref 70–99)
Glucose-Capillary: 124 mg/dL — ABNORMAL HIGH (ref 70–99)
Glucose-Capillary: 127 mg/dL — ABNORMAL HIGH (ref 70–99)
Glucose-Capillary: 159 mg/dL — ABNORMAL HIGH (ref 70–99)
Glucose-Capillary: 213 mg/dL — ABNORMAL HIGH (ref 70–99)
Glucose-Capillary: 96 mg/dL (ref 70–99)

## 2023-06-06 LAB — C-REACTIVE PROTEIN: CRP: 5.4 mg/dL — ABNORMAL HIGH (ref <1.0)

## 2023-06-06 LAB — CBC WITH DIFFERENTIAL/PLATELET
Abs Immature Granulocytes: 0 10*3/uL (ref 0.00–0.07)
Basophils Absolute: 0.1 10*3/uL (ref 0.0–0.1)
Basophils Relative: 1 %
Eosinophils Absolute: 0.5 10*3/uL (ref 0.0–0.5)
Eosinophils Relative: 5 %
HCT: 48.5 % — ABNORMAL HIGH (ref 36.0–46.0)
Hemoglobin: 15.6 g/dL — ABNORMAL HIGH (ref 12.0–15.0)
Lymphocytes Relative: 13 %
Lymphs Abs: 1.2 10*3/uL (ref 0.7–4.0)
MCH: 31.4 pg (ref 26.0–34.0)
MCHC: 32.2 g/dL (ref 30.0–36.0)
MCV: 97.6 fL (ref 80.0–100.0)
Monocytes Absolute: 0.7 10*3/uL (ref 0.1–1.0)
Monocytes Relative: 7 %
Neutro Abs: 7.1 10*3/uL (ref 1.7–7.7)
Neutrophils Relative %: 74 %
Platelets: 194 10*3/uL (ref 150–400)
RBC: 4.97 MIL/uL (ref 3.87–5.11)
RDW: 13.5 % (ref 11.5–15.5)
WBC: 9.6 10*3/uL (ref 4.0–10.5)
nRBC: 0 % (ref 0.0–0.2)
nRBC: 0 /100{WBCs}

## 2023-06-06 LAB — BASIC METABOLIC PANEL
Anion gap: 14 (ref 5–15)
BUN: 49 mg/dL — ABNORMAL HIGH (ref 8–23)
CO2: 30 mmol/L (ref 22–32)
Calcium: 9.3 mg/dL (ref 8.9–10.3)
Chloride: 93 mmol/L — ABNORMAL LOW (ref 98–111)
Creatinine, Ser: 1.47 mg/dL — ABNORMAL HIGH (ref 0.44–1.00)
GFR, Estimated: 37 mL/min — ABNORMAL LOW (ref >60)
Glucose, Bld: 99 mg/dL (ref 70–99)
Potassium: 3.9 mmol/L (ref 3.5–5.1)
Sodium: 137 mmol/L (ref 135–145)

## 2023-06-06 LAB — PROCALCITONIN: Procalcitonin: 1.23 ng/mL

## 2023-06-06 LAB — PROTIME-INR
INR: 4 — ABNORMAL HIGH (ref 0.8–1.2)
Prothrombin Time: 39.3 s — ABNORMAL HIGH (ref 11.4–15.2)

## 2023-06-06 LAB — MAGNESIUM: Magnesium: 2.2 mg/dL (ref 1.7–2.4)

## 2023-06-06 LAB — PHOSPHORUS: Phosphorus: 4.1 mg/dL (ref 2.5–4.6)

## 2023-06-06 LAB — BRAIN NATRIURETIC PEPTIDE: B Natriuretic Peptide: 63.7 pg/mL (ref 0.0–100.0)

## 2023-06-06 NOTE — Progress Notes (Signed)
 PHARMACY - ANTICOAGULATION CONSULT NOTE  Pharmacy Consult for warfarin Indication: atrial fibrillation and hx DVT/PE  Allergies  Allergen Reactions   Heparin Hives    Patient attests severe allergy- rash all over, severe itching, unable to take heparin  Can take lovenox   Other Itching and Rash    "Sheets and Linens used by [name]" Mainly while getting the heparin    Patient Measurements: Height: 5\' 5"  (165.1 cm) Weight: 116.3 kg (256 lb 6.3 oz) IBW/kg (Calculated) : 57   Vital Signs: Temp: 97.2 F (36.2 C) (03/11 0857) Temp Source: Oral (03/11 0857) BP: 96/66 (03/11 0857) Pulse Rate: 76 (03/11 0857)  Labs: Recent Labs    06/04/23 0538 06/05/23 0512 06/05/23 0952 06/06/23 0442  HGB 15.3* 15.8*  --  15.6*  HCT 46.8* 48.6*  --  48.5*  PLT 163 182  --  194  LABPROT 30.8* 41.0* 40.4* 39.3*  INR 2.9* 4.2* 4.2* 4.0*  CREATININE 1.15* 1.18*  --  1.47*    Estimated Creatinine Clearance: 40.8 mL/min (A) (by C-G formula based on SCr of 1.47 mg/dL (H)).   Medical History: Past Medical History:  Diagnosis Date   (HFpEF) heart failure with preserved ejection fraction (HCC)    a. 06/2008 Echo: EF 55-60%, mild LVH, triv AI, mild to mod MS, mod to sev dil LA, mildly dil RA.   Acute right MCA stroke (HCC)    a. 06/04/2010 Embolic stroke treated with TPA and thrombectomy with hemorrhagic transformation; Carotids 3/12 negative for ICA stenosis.   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 08/2005 - s/p IVC filter.   CHF (congestive heart failure) (HCC)    Depression    Embolus of femoral artery (HCC)    a. 06/2008 s/p bilateral embolectomies.   History of DVT (deep vein thrombosis)    a. s/p IVC filter.   HIT (heparin-induced thrombocytopenia) (HCC)    HTN (hypertension)    Lupus anticoagulant disorder (HCC)    Mitral stenosis    moderate-severe 09/04/20 echo   Obesity, unspecified    Permanent atrial fibrillation  (HCC)    a. On chronic Coumadin - INRs followed by PCP; b. CHA2DS2VASc = 5.   Popliteal artery embolism, right (HCC)    a. 01/2017 in the setting of subRx INR-->s/p embolectomy.    Assessment: 40 yoF with PHM of atrial fibrillaiton and PE on warfarin PTA (admit INR 2.3) last dose 3/4. CBC stable. Pharmacy consulted to manage warfarin.   3/11 AM: INR remains supratherapeutic this AM at 4 but trending down after dose held 3/10.  CBC remains stable. May be elevated in setting of ceftriaxone and doxycycline use (last dose 3/10).   Home warfarin: 3mg  Mon, Wed, Fri, Sun and 4.5mg  all other days   Goal of Therapy:  Heparin level 0.3-0.7 units/ml Monitor platelets by anticoagulation protocol: Yes   Plan:  Hold Warfarin again today  Monitor INR and CBC daily  Can likely restart Warfarin 3/12  [name], Pharm.D., BCPS Clinical Pharmacist Clinical phone for 06/06/2023 from 7:30-3:00 is [phone].  **Pharmacist phone directory can be found on [email or web address] listed under [facility].  06/06/2023 9:10 AM

## 2023-06-06 NOTE — Progress Notes (Addendum)
   Patient Name: [name] Date of Encounter: 06/06/2023 [facility] Cardiologist: Rollene Rotunda, MD   Interval Summary  .    Patient reports feeling OK this AM. Breathing is improving, but she is still on 3 L via [place]. No chest pain, palpitations. HR is in the 90s per telemetry   Vital Signs .    Vitals:   06/05/23 2113 06/05/23 2303 06/06/23 0400 06/06/23 0857  BP: 124/88 (!) 127/98 103/66 96/66  Pulse: 85 83 71 76  Resp: 20 (!) 21 18 20   Temp:  97.9 F (36.6 C) 98.2 F (36.8 C) (!) 97.2 F (36.2 C)  TempSrc:  Oral Oral Oral  SpO2: 95% 91% 92% 95%  Weight:      Height:        Intake/Output Summary (Last 24 hours) at 06/06/2023 0934 Last data filed at 06/05/2023 2117 Gross per 24 hour  Intake 3 ml  Output --  Net 3 ml      05/31/2023   12:37 AM 06/03/2022    5:00 AM 06/02/2022    5:00 AM  Last 3 Weights  Weight (lbs) 256 lb 6.3 oz 256 lb 6.3 oz 253 lb 8.5 oz  Weight (kg) 116.3 kg 116.3 kg 115 kg      Telemetry/ECG    Atrial fibrillation, HR in the 80s-90s - Personally Reviewed  Physical Exam .   GEN: No acute distress.  Sitting upright in the chair. Eating breakfast  Neck: No JVD Cardiac: Irregular rate and rhythm, no murmurs, rubs, or gallops. Respiratory: Distant breath sounds throughout.  GI: Soft, nontender, non-distended  MS: No edema in BLE   Assessment & Plan .     Chronic HFpEF Pulmonary Edema with hypoxic respiratory failure  - Previous echocardiogram from 01/2022 showed EF 45-50% with global hypokinesis, normal RV function. Echocardiogram this admission showed EF 55%, no wall motion abnormalities, normal RV function  - Patient initially required BiPAP. BNP elevated to 715 on 3/5. Has since trended down to 63 - CXR on 3.5 showed hazy opacities projecting over the lower lungs, suspicious for pneumonia or edema. CXR on 3/9 showed improving pulmonary edema  - Patient on IV lasix 40 mg daily. I/Os are incomplete. Creatinine bumped from 1.18  yesterday to 1.47 today. She is euvolemic on exam  - Hold IV lasix and spiro - Continue metoprolol tartrate 25 mg BID, entresto 49-51 mg BID  Permanent atrial fibrillation  - HR overall well controlled per telemetry  - Continue metoprolol tartrate 25 mg BID  - Continue warfarin dosed by pharmacy   Moderate to severe MS  - Echocardiogram this admission showed rheumatic MV with moderate-severe mitral stenosis  - Not felt to be a surgical candidate   Elevated troponin  - hsTn 135>125  - Trend not consistent with ACS. Suspect demand ischemia from hypoxic respiratory failure   Lupus coagulant disorder  History of DVT/PE  - Continue coumadin   Otherwise per primary  - Possible bronchitis vs early community-acquired pneumonia  - CKD stage 3a - Obesity class 3   For questions or updates, please contact [facility] Please consult [email or web address] for contact info under        Signed, Jonita Albee, PA-C

## 2023-06-06 NOTE — Progress Notes (Signed)
 PT Cancellation Note  Patient Details Name: [name] MRN: [id number] DOB: [birth year]   Cancelled Treatment:    Reason Eval/Treat Not Completed: Fatigue/lethargy limiting ability to participate Patient reports she just got back into bed. Declines PT at this time. Will re-attempt at later date.   Ellasyn Swilling 06/06/2023, 1:34 PM

## 2023-06-06 NOTE — Progress Notes (Addendum)
 [facility] PROGRESS NOTE   GUSTAVO MEDITZ ZOX:[id number] DOB: Apr 18, 1945 DOA: 05/31/2023  PCP: Marden Noble, MD (Inactive)  Brief History: 78 yo female with the past medical history of hypertension, heart failure, atrial fibrillation, mitral stenosis, history of CVA, obesity and lupus hyper-coagulant who presented with worsening dyspnea.  Chest x-ray suggested pulmonary edema.  Patient was hospitalized for further management.     Consultants: Cardiology  Procedures: None yet    Subjective/Interval History: Patient in bed, appears comfortable, denies any headache, no fever, no chest pain or pressure, no shortness of breath , no abdominal pain. No focal weakness.   Assessment/Plan:  Acute on chronic diastolic CHF/mitral stenosis Echocardiogram with preserved LV systolic function with EF 55%, mild LVH. RV systolic function is preserved. LA with severe dilatation, RA with moderate dilatation, moderate to severe mitral stenosis , mild TR.  Diuretics per cardiology Patient noted to be on metoprolol, Aldactone and Entresto along with Lasix, will monitor closely blood pressure on the lower side.  Will change Lasix to once a day. Cardiology continues to follow.  Clinically significantly improved we will continue to monitor.   Acute hypoxemic respiratory failure due to acute cardiogenic pulmonary edema along with possible bronchitis/early community-acquired pneumonia. Was on BiPAP initially.  Now off of it.   Does not use oxygen at home. improved with diuretics and antibiotics, advance activity titrate down oxygen,   SpO2: 95 % O2 Flow Rate (L/min): 3 L/min FiO2 (%): 40 %   Elevated troponin This is likely due to heart failure exacerbation, ruled out acute coronary syndrome.    Chronic atrial fibrillation with RVR (HCC) This is in the setting of mitral stenosis.   Continue rate control with metoprolol and anticoagulation with warfarin  Pharm monitoring Coumadin.    Essential hypertension Blood pressure now getting on the lower side, she is on furosemide, metoprolol, Entresto, spironolactone.  Informed cardiology team to adjust medications as needed.  Renal function also gradually declining.   CKD stage 3a, GFR 45-59 ml/min (HCC) plan creatinine close to 1.3. Mild decline in renal function, kindly see above.  Hypokalemia Potassium level has improved.  Will give additional dose today.   History of lupus anticoagulant disorder History of DVT and PE Continue anticoagulation with warfarin    Obesity, class 3 Estimated body mass index is 42.67 kg/m as calculated from the following:   Height as of this encounter: 5\' 5"  (1.651 m).   Weight as of this encounter: 116.3 kg.  DVT Prophylaxis: Anticoagulated with warfarin Code Status: Full code Family Communication: Discussed with patient Disposition Plan: Start mobilizing.  Anticipate return home when improved      Medications: Scheduled:  guaiFENesin  600 mg Oral BID   insulin aspart  0-9 Units Subcutaneous Q4H   metoprolol tartrate  25 mg Oral BID   sacubitril-valsartan  1 tablet Oral BID   sodium chloride flush  3 mL Intravenous Q12H   spironolactone  25 mg Oral Daily   Warfarin - Pharmacist Dosing Inpatient   Does not apply q1600   Continuous:  Objective:  Vital Signs  Vitals:   06/05/23 2113 06/05/23 2303 06/06/23 0400 06/06/23 0857  BP: 124/88 (!) 127/98 103/66 96/66  Pulse: 85 83 71 76  Resp: 20 (!) 21 18 20   Temp:  97.9 F (36.6 C) 98.2 F (36.8 C) (!) 97.2 F (36.2 C)  TempSrc:  Oral Oral Oral  SpO2: 95% 91% 92% 95%  Weight:      Height:  Intake/Output Summary (Last 24 hours) at 06/06/2023 1040 Last data filed at 06/05/2023 2117 Gross per 24 hour  Intake 3 ml  Output --  Net 3 ml    Filed Weights   05/31/23 0037  Weight: 116.3 kg    Exam  Awake Alert, No new F.N deficits, Normal affect .AT,PERRAL Supple Neck, No JVD,   Symmetrical Chest wall  movement, Good air movement bilaterally, few rales RRR,No Gallops, Rubs or new Murmurs,  +ve B.Sounds, Abd Soft, No tenderness,   No Cyanosis, Clubbing , no edema   Lab Results:  Data Reviewed: I have personally reviewed following labs and reports of the imaging studies Recent Labs  Lab 06/02/23 0532 06/03/23 0545 06/04/23 0538 06/05/23 0512 06/06/23 0442  WBC 15.5* 10.8* 10.5 9.0 9.6  HGB 13.7 15.0 15.3* 15.8* 15.6*  HCT 42.3 46.0 46.8* 48.6* 48.5*  PLT 146* 166 163 182 194  MCV 98.8 96.4 98.1 98.0 97.6  MCH 32.0 31.4 32.1 31.9 31.4  MCHC 32.4 32.6 32.7 32.5 32.2  RDW 13.4 13.5 13.7 13.8 13.5  LYMPHSABS  --   --   --  1.1 1.2  MONOABS  --   --   --  0.9 0.7  EOSABS  --   --   --  0.5 0.5  BASOSABS  --   --   --  0.1 0.1    Recent Labs  Lab 05/31/23 0020 05/31/23 0113 05/31/23 0800 05/31/23 0919 05/31/23 1226 06/01/23 1610 06/02/23 0532 06/03/23 0545 06/04/23 0538 06/04/23 0835 06/05/23 0512 06/05/23 0952 06/06/23 0442  NA 137  136   < >  --   --   --    < > 135 135 139  --  139  --  137  K 4.8  4.8   < >  --   --   --    < > 3.4* 3.6 3.8  --  3.9  --  3.9  CL 98  --   --   --   --    < > 96* 94* 95*  --  93*  --  93*  CO2 22  --   --   --   --    < > 30 29 32  --  31  --  30  ANIONGAP 17*  --   --   --   --    < > 9 12 12   --  15  --  14  GLUCOSE 295*  --   --   --   --    < > 101* 112* 108*  --  108*  --  99  BUN 17  --   --   --   --    < > 25* 28* 35*  --  38*  --  49*  CREATININE 1.23*  --   --   --   --    < > 0.97 1.07* 1.15*  --  1.18*  --  1.47*  AST 26  --   --   --   --   --   --   --   --   --   --   --   --   ALT 19  --   --   --   --   --   --   --   --   --   --   --   --   ALKPHOS 100  --   --   --   --   --   --   --   --   --   --   --   --  BILITOT 1.3*  --   --   --   --   --   --   --   --   --   --   --   --   ALBUMIN 3.4*  --   --   --   --   --   --   --   --   --   --   --   --   CRP  --   --   --   --   --   --   --   --   --   11.4* 8.6*  --  5.4*  PROCALCITON  --   --  12.40  --   --   --   --   --  3.26  --  1.66  --  1.23  LATICACIDVEN  --   --   --  2.0* 2.6*  --   --   --   --   --   --   --   --   INR 2.3*  --   --   --   --    < > 2.8* 2.6* 2.9*  --  4.2* 4.2* 4.0*  TSH  --   --  1.045  --   --   --   --   --   --   --   --   --   --   HGBA1C  --   --  5.8*  --   --   --   --   --   --   --   --   --   --   BNP 715.1*  --   --   --   --   --   --   --  68.5  --  83.7  --  63.7  MG  --   --   --   --   --   --  1.8 1.8  --  2.0 2.1  --  2.2  PHOS  --   --   --   --   --   --   --   --   --   --  3.8  --  4.1  CALCIUM 8.8*  --   --   --   --    < > 8.2* 8.8* 8.8*  --  9.2  --  9.3   < > = values in this interval not displayed.      Recent Labs  Lab 05/31/23 0020 05/31/23 0800 05/31/23 0919 05/31/23 1226 06/01/23 1610 06/02/23 0532 06/03/23 0545 06/04/23 9604 06/04/23 5409 06/05/23 0512 06/05/23 0952 06/06/23 0442  CRP  --   --   --   --   --   --   --   --  11.4* 8.6*  --  5.4*  PROCALCITON  --  12.40  --   --   --   --   --  3.26  --  1.66  --  1.23  LATICACIDVEN  --   --  2.0* 2.6*  --   --   --   --   --   --   --   --   INR 2.3*  --   --   --    < > 2.8* 2.6* 2.9*  --  4.2* 4.2* 4.0*  TSH  --  1.045  --   --   --   --   --   --   --   --   --   --  HGBA1C  --  5.8*  --   --   --   --   --   --   --   --   --   --   BNP 715.1*  --   --   --   --   --   --  68.5  --  83.7  --  63.7  MG  --   --   --   --   --  1.8 1.8  --  2.0 2.1  --  2.2  CALCIUM 8.8*  --   --   --    < > 8.2* 8.8* 8.8*  --  9.2  --  9.3   < > = values in this interval not displayed.    Lab Results  Component Value Date   HGBA1C 5.8 (H) 05/31/2023   Micro Results Recent Results (from the past 240 hours)  Culture, blood (Routine X 2) w Reflex to ID Panel     Status: None   Collection Time: 05/31/23  7:40 AM   Specimen: BLOOD LEFT HAND  Result Value Ref Range Status   Specimen Description BLOOD LEFT HAND  Final    Special Requests   Final    BOTTLES DRAWN AEROBIC AND ANAEROBIC Blood Culture adequate volume   Culture   Final    NO GROWTH 5 DAYS Performed at [facility], [address] [place], [place] [address]    Report Status 06/05/2023 FINAL  Final  Resp panel by RT-PCR (RSV, Flu A&B, Covid) Anterior Nasal Swab     Status: None   Collection Time: 05/31/23  7:42 AM   Specimen: Anterior Nasal Swab  Result Value Ref Range Status   SARS Coronavirus 2 by RT PCR NEGATIVE NEGATIVE Final   Influenza A by PCR NEGATIVE NEGATIVE Final   Influenza B by PCR NEGATIVE NEGATIVE Final    Comment: (NOTE) The Xpert Xpress SARS-CoV-2/FLU/RSV plus assay is intended as an aid in the diagnosis of influenza from Nasopharyngeal swab specimens and should not be used as a sole basis for treatment. Nasal washings and aspirates are unacceptable for Xpert Xpress SARS-CoV-2/FLU/RSV testing.  Fact Sheet for Patients: BloggerCourse.com  Fact Sheet for Healthcare Providers: SeriousBroker.it  This test is not yet approved or cleared by the [place] FDA and has been authorized for detection and/or diagnosis of SARS-CoV-2 by FDA under an Emergency Use Authorization (EUA). This EUA will remain in effect (meaning this test can be used) for the duration of the COVID-19 declaration under Section 564(b)(1) of the Act, 21 U.S.C. section 360bbb-3(b)(1), unless the authorization is terminated or revoked.     Resp Syncytial Virus by PCR NEGATIVE NEGATIVE Final    Comment: (NOTE) Fact Sheet for Patients: BloggerCourse.com  Fact Sheet for Healthcare Providers: SeriousBroker.it  This test is not yet approved or cleared by the [place] FDA and has been authorized for detection and/or diagnosis of SARS-CoV-2 by FDA under an Emergency Use Authorization (EUA). This EUA will remain in effect (meaning this test can be  used) for the duration of the COVID-19 declaration under Section 564(b)(1) of the Act, 21 U.S.C. section 360bbb-3(b)(1), unless the authorization is terminated or revoked.  Performed at [facility], [address] [place], [place] [address]   Culture, blood (Routine X 2) w Reflex to ID Panel     Status: None   Collection Time: 05/31/23  8:30 AM   Specimen: BLOOD RIGHT WRIST  Result Value  Ref Range Status   Specimen Description BLOOD RIGHT WRIST  Final   Special Requests   Final    BOTTLES DRAWN AEROBIC AND ANAEROBIC Blood Culture adequate volume   Culture   Final    NO GROWTH 5 DAYS Performed at [facility], [address], [place], [place] [address]    Report Status 06/05/2023 FINAL  Final    Radiology Reports  No results found.      LOS: 6 days   Signature  -    Susa Raring M.D on 06/06/2023 at 10:40 AM   -  To page go to [email or web address]

## 2023-06-06 NOTE — Progress Notes (Signed)
 Occupational Therapy Treatment Patient Details Name: [name] MRN: [id number] DOB: 19-Feb-1946 Today's Date: 06/06/2023   History of present illness Pt is a 78 y/o F presenting to ED On 3/5 wtih vomiting and SOB, admitted for acute on chronic CHF and possible CAP. 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 Overall needing supervision - CGA for ambulation with RW. Pt educated on sitting vs standing for ADL/IADL tasks at home as needed. Pt presenting with impairments listed below, will follow acutely. Continue to recommend HHOT at d/c.       If plan is discharge home, recommend the following:  A little help with walking and/or transfers;A little help with bathing/dressing/bathroom;Assistance with cooking/housework;Direct supervision/assist for medications management;Direct supervision/assist for financial management;Assist for transportation;Help with stairs or ramp for entrance   Equipment Recommendations  BSC/3in1    Recommendations for Other Services PT consult    Precautions / Restrictions Precautions Precautions: Fall Precaution/Restrictions Comments: watch O2 Restrictions Weight Bearing Restrictions Per Provider Order: No       Mobility Bed Mobility Overal bed mobility: Needs Assistance Bed Mobility: Sit to Supine     Supine to sit: Contact guard     General bed mobility comments: use of bed rails    Transfers Overall transfer level: Needs assistance Equipment used: Rolling walker (2 wheels) Transfers: Sit to/from Stand, Bed to chair/wheelchair/BSC Sit to Stand: Contact guard  assist, Supervision                 Balance Overall balance assessment: Needs assistance Sitting-balance support: Feet supported Sitting balance-Leahy Scale: Good     Standing balance support: Reliant on assistive device for balance, During functional activity Standing balance-Leahy Scale: Poor Standing balance comment: reliant on RW support                           ADL either performed or assessed with clinical judgement   ADL Overall ADL's : Needs assistance/impaired     Grooming: Oral care;Wash/dry face;Standing               Lower Body Dressing: Minimal assistance Lower Body Dressing Details (indicate cue type and reason): reaches down to pull up socks Toilet Transfer: Supervision/safety;Contact guard assist;Ambulation;Rolling walker (2 wheels)           Functional mobility during ADLs: Contact guard assist;Supervision/safety;Rolling walker (2 wheels)      Extremity/Trunk Assessment Upper Extremity Assessment Upper Extremity Assessment: Generalized weakness   Lower Extremity Assessment Lower Extremity Assessment: Defer to PT evaluation        Vision   Vision Assessment?: No apparent visual deficits   Perception Perception Perception: Not tested   Praxis Praxis Praxis: Not tested   Communication Communication Communication: No apparent difficulties   Cognition Arousal: Alert Behavior During Therapy: WFL for tasks assessed/performed Cognition: No apparent impairments                               Following commands: Intact        Cueing  Exercises      Shoulder Instructions       General Comments VSS on 90s on 3L when pleth good    Pertinent Vitals/ Pain       Pain Assessment Pain Assessment: No/denies pain  Home Living                                          Prior Functioning/Environment              Frequency  Min 2X/week        Progress Toward Goals  OT  Goals(current goals can now be found in the care plan section)  Progress towards OT goals: Progressing toward goals  Acute Rehab OT Goals Patient Stated Goal: none stated OT Goal Formulation: With patient Time For Goal Achievement: 06/17/23 Potential to Achieve Goals: Good ADL Goals Pt Will Perform Upper Body Dressing: Independently;sitting Pt Will Perform Lower Body Dressing: Independently;sitting/lateral leans;sit to/from stand Pt Will Transfer to Toilet: Independently;ambulating;regular height toilet Additional ADL Goal #2: pt will verbalize x3 energy conservation strategies in prep for ADLs  Plan      Co-evaluation                 AM-PAC OT "6 Clicks" Daily Activity     Outcome Measure   Help from another person eating meals?: None Help from another person taking care of personal grooming?: A Little Help from another person toileting, which includes using toliet, bedpan, or urinal?: A Little Help from another person bathing (including washing, rinsing, drying)?: A Lot Help from another person to put on and taking off regular upper body clothing?: A Little Help from another person to put on and taking off regular lower body clothing?: A Little 6 Click Score: 18    End of Session Equipment Utilized During Treatment: Gait belt;Rolling walker (2 wheels);Oxygen  OT Visit Diagnosis: Unsteadiness on feet (R26.81);Other abnormalities of gait and mobility (R26.89);Muscle weakness (generalized) (M62.81)   Activity Tolerance Patient tolerated treatment well   Patient Left in chair;with call bell/phone within reach;with chair alarm set   Nurse Communication Mobility status;Other (comment)        Time: 8413-2440 OT Time Calculation (min): 28 min  Charges: OT General Charges $OT Visit: 1 Visit OT Treatments $Self Care/Home Management : 8-22 mins $Therapeutic Activity: 8-22 mins  Carver Fila, OTD, OTR/L SecureChat Preferred Acute Rehab [phone]   Dalphine Handing 06/06/2023, 8:35 AM

## 2023-06-06 NOTE — Plan of Care (Signed)

## 2023-06-07 ENCOUNTER — Encounter (HOSPITAL_COMMUNITY)
Admission: EM | Disposition: A | Discharge status: Home Health | Payer: Self-pay | Source: Home / Self Care | Attending: Internal Medicine

## 2023-06-07 ENCOUNTER — Inpatient Hospital Stay (HOSPITAL_COMMUNITY)

## 2023-06-07 DIAGNOSIS — I5033 Acute on chronic diastolic (congestive) heart failure: Secondary | ICD-10-CM | POA: Diagnosis not present

## 2023-06-07 HISTORY — PX: RIGHT HEART CATH: CATH118263

## 2023-06-07 LAB — CBC WITH DIFFERENTIAL/PLATELET
Abs Immature Granulocytes: 0 10*3/uL (ref 0.00–0.07)
Basophils Absolute: 0.2 10*3/uL — ABNORMAL HIGH (ref 0.0–0.1)
Basophils Relative: 2 %
Eosinophils Absolute: 1 10*3/uL — ABNORMAL HIGH (ref 0.0–0.5)
Eosinophils Relative: 11 %
HCT: 45.3 % (ref 36.0–46.0)
Hemoglobin: 15.1 g/dL — ABNORMAL HIGH (ref 12.0–15.0)
Lymphocytes Relative: 12 %
Lymphs Abs: 1.1 10*3/uL (ref 0.7–4.0)
MCH: 31.9 pg (ref 26.0–34.0)
MCHC: 33.3 g/dL (ref 30.0–36.0)
MCV: 95.8 fL (ref 80.0–100.0)
Monocytes Absolute: 0.4 10*3/uL (ref 0.1–1.0)
Monocytes Relative: 4 %
Neutro Abs: 6.4 10*3/uL (ref 1.7–7.7)
Neutrophils Relative %: 71 %
Platelets: 201 10*3/uL (ref 150–400)
RBC: 4.73 MIL/uL (ref 3.87–5.11)
RDW: 13.5 % (ref 11.5–15.5)
WBC: 9 10*3/uL (ref 4.0–10.5)
nRBC: 0 % (ref 0.0–0.2)
nRBC: 0 /100{WBCs}

## 2023-06-07 LAB — POCT I-STAT 7, (LYTES, BLD GAS, ICA,H+H)
Acid-Base Excess: 8 mmol/L — ABNORMAL HIGH (ref 0.0–2.0)
Bicarbonate: 34.2 mmol/L — ABNORMAL HIGH (ref 20.0–28.0)
Calcium, Ion: 1.18 mmol/L (ref 1.15–1.40)
HCT: 48 % — ABNORMAL HIGH (ref 36.0–46.0)
Hemoglobin: 16.3 g/dL — ABNORMAL HIGH (ref 12.0–15.0)
O2 Saturation: 59 %
Potassium: 4.4 mmol/L (ref 3.5–5.1)
Sodium: 138 mmol/L (ref 135–145)
TCO2: 36 mmol/L — ABNORMAL HIGH (ref 22–32)
pCO2 arterial: 50.7 mmHg — ABNORMAL HIGH (ref 32–48)
pH, Arterial: 7.438 (ref 7.35–7.45)
pO2, Arterial: 30 mmHg — CL (ref 83–108)

## 2023-06-07 LAB — GLUCOSE, CAPILLARY
Glucose-Capillary: 146 mg/dL — ABNORMAL HIGH (ref 70–99)
Glucose-Capillary: 111 mg/dL — ABNORMAL HIGH (ref 70–99)
Glucose-Capillary: 111 mg/dL — ABNORMAL HIGH (ref 70–99)
Glucose-Capillary: 187 mg/dL — ABNORMAL HIGH (ref 70–99)

## 2023-06-07 LAB — BASIC METABOLIC PANEL
Anion gap: 11 (ref 5–15)
BUN: 53 mg/dL — ABNORMAL HIGH (ref 8–23)
CO2: 30 mmol/L (ref 22–32)
Calcium: 9 mg/dL (ref 8.9–10.3)
Chloride: 95 mmol/L — ABNORMAL LOW (ref 98–111)
Creatinine, Ser: 1.28 mg/dL — ABNORMAL HIGH (ref 0.44–1.00)
GFR, Estimated: 43 mL/min — ABNORMAL LOW (ref >60)
Glucose, Bld: 106 mg/dL — ABNORMAL HIGH (ref 70–99)
Potassium: 3.9 mmol/L (ref 3.5–5.1)
Sodium: 136 mmol/L (ref 135–145)

## 2023-06-07 LAB — MAGNESIUM: Magnesium: 2.2 mg/dL (ref 1.7–2.4)

## 2023-06-07 LAB — POCT I-STAT EG7
Acid-Base Excess: 8 mmol/L — ABNORMAL HIGH (ref 0.0–2.0)
Bicarbonate: 33.7 mmol/L — ABNORMAL HIGH (ref 20.0–28.0)
Calcium, Ion: 1.18 mmol/L (ref 1.15–1.40)
HCT: 48 % — ABNORMAL HIGH (ref 36.0–46.0)
Hemoglobin: 16.3 g/dL — ABNORMAL HIGH (ref 12.0–15.0)
O2 Saturation: 56 %
Potassium: 4.5 mmol/L (ref 3.5–5.1)
Sodium: 137 mmol/L (ref 135–145)
TCO2: 35 mmol/L — ABNORMAL HIGH (ref 22–32)
pCO2, Ven: 50.7 mmHg (ref 44–60)
pH, Ven: 7.43 (ref 7.25–7.43)
pO2, Ven: 29 mmHg — CL (ref 32–45)

## 2023-06-07 LAB — BRAIN NATRIURETIC PEPTIDE: B Natriuretic Peptide: 81.1 pg/mL (ref 0.0–100.0)

## 2023-06-07 LAB — PROTIME-INR
INR: 3.1 — ABNORMAL HIGH (ref 0.8–1.2)
Prothrombin Time: 31.8 s — ABNORMAL HIGH (ref 11.4–15.2)

## 2023-06-07 LAB — C-REACTIVE PROTEIN: CRP: 3.3 mg/dL — ABNORMAL HIGH (ref <1.0)

## 2023-06-07 LAB — PHOSPHORUS: Phosphorus: 4 mg/dL (ref 2.5–4.6)

## 2023-06-07 LAB — PROCALCITONIN: Procalcitonin: 0.71 ng/mL

## 2023-06-07 MED ORDER — LIDOCAINE HCL (PF) 1 % IJ SOLN
INTRAMUSCULAR | Status: AC
  Filled 2023-06-07: qty 30

## 2023-06-07 MED ORDER — LIDOCAINE HCL (PF) 1 % IJ SOLN
INTRAMUSCULAR | Status: DC | PRN
  Administered 2023-06-07: 5 mL

## 2023-06-07 MED ORDER — SODIUM CHLORIDE 0.9% FLUSH
3.0000 mL | Freq: Two times a day (BID) | INTRAVENOUS | Status: DC
  Administered 2023-06-07 – 2023-06-08 (×2): 3 mL via INTRAVENOUS

## 2023-06-07 MED ORDER — SODIUM CHLORIDE 0.9 % IV SOLN
INTRAVENOUS | Status: AC | PRN
  Administered 2023-06-07: 500 mL via INTRAVENOUS

## 2023-06-07 MED ORDER — WARFARIN SODIUM 2 MG PO TABS
3.0000 mg | ORAL_TABLET | Freq: Once | ORAL | Status: DC
  Filled 2023-06-07: qty 1

## 2023-06-07 MED ORDER — SPIRONOLACTONE 12.5 MG HALF TABLET
12.5000 mg | ORAL_TABLET | Freq: Every day | ORAL | Status: DC
  Administered 2023-06-07 – 2023-06-08 (×2): 12.5 mg via ORAL
  Filled 2023-06-07 (×2): qty 1

## 2023-06-07 MED ORDER — ACETAMINOPHEN 325 MG PO TABS: 650.0000 mg | ORAL_TABLET | ORAL | Status: DC | PRN

## 2023-06-07 MED ORDER — SODIUM CHLORIDE 0.9 % IV SOLN: 250.0000 mL | INTRAVENOUS | Status: DC | PRN

## 2023-06-07 MED ORDER — SODIUM CHLORIDE 0.9% FLUSH: 3.0000 mL | INTRAVENOUS | Status: DC | PRN

## 2023-06-07 MED ORDER — ONDANSETRON HCL 4 MG/2ML IJ SOLN: 4.0000 mg | Freq: Four times a day (QID) | INTRAMUSCULAR | Status: DC | PRN

## 2023-06-07 MED ORDER — SACUBITRIL-VALSARTAN 24-26 MG PO TABS
1.0000 | ORAL_TABLET | Freq: Two times a day (BID) | ORAL | Status: DC
  Administered 2023-06-07 – 2023-06-08 (×3): 1 via ORAL
  Filled 2023-06-07 (×4): qty 1

## 2023-06-07 MED ORDER — LABETALOL HCL 5 MG/ML IV SOLN: 10.0000 mg | INTRAVENOUS | Status: DC | PRN

## 2023-06-07 MED ORDER — HYDRALAZINE HCL 20 MG/ML IJ SOLN: 10.0000 mg | INTRAMUSCULAR | Status: DC | PRN

## 2023-06-07 NOTE — Progress Notes (Signed)
 [facility] PROGRESS NOTE   [name] WUJ:[id number] DOB: Jan 22, 1946 DOA: 05/31/2023  PCP: Marden Noble, MD (Inactive)  Brief History: 78 yo female with the past medical history of hypertension, heart failure, atrial fibrillation, mitral stenosis, history of CVA, obesity and lupus hyper-coagulant who presented with worsening dyspnea.  Chest x-ray suggested pulmonary edema.  Patient was hospitalized for further management.     Consultants: Cardiology  Procedures: None yet    Subjective/Interval History: Patient in bed, appears comfortable, denies any headache, no fever, no chest pain or pressure, no shortness of breath , no abdominal pain. No new focal weakness.   Assessment/Plan:  Acute on chronic diastolic CHF/mitral stenosis Echocardiogram with preserved LV systolic function with EF 55%, mild LVH. RV systolic function is preserved. LA with severe dilatation, RA with moderate dilatation, moderate to severe mitral stenosis , mild TR.  Diuretics per cardiology Patient noted to be on metoprolol and Entresto along with diuretics PRN per Cards,  will monitor closely blood pressure on the lower side.   Clinically significantly improved we will continue to monitor.Cards might do R.H cath 3/12/125.   Acute hypoxemic respiratory failure due to acute cardiogenic pulmonary edema along with possible bronchitis/early community-acquired pneumonia. Was on BiPAP initially.  Now off of it.   Does not use oxygen at home. improved with diuretics and antibiotics(finished), advance activity titrate down oxygen,   SpO2: 100 % O2 Flow Rate (L/min): 3 L/min FiO2 (%): 40 %   Elevated troponin This is likely due to heart failure exacerbation, ruled out acute coronary syndrome.    Chronic atrial fibrillation with RVR (HCC) This is in the setting of mitral stenosis.   Continue rate control with metoprolol and anticoagulation with warfarin  Pharm monitoring Coumadin.   Essential  hypertension Blood pressure now getting on the lower side, she is on furosemide, metoprolol, Entresto, spironolactone.  Informed cardiology team to adjust medications as needed.  Renal function also gradually declining.   CKD stage 3a, GFR 45-59 ml/min (HCC) plan creatinine close to 1.3. Mild decline in renal function 06/06/23, kindly see above.  Hypokalemia Potassium level has improved.  Will give additional dose today.   History of lupus anticoagulant disorder History of DVT and PE Continue anticoagulation with warfarin    Obesity, class 3 Estimated body mass index is 42.67 kg/m as calculated from the following:   Height as of this encounter: 5\' 5"  (1.651 m).   Weight as of this encounter: 116.3 kg.  DVT Prophylaxis: Anticoagulated with warfarin Code Status: Full code Family Communication: Discussed with patient Disposition Plan: Start mobilizing.  Anticipate return home when improved   Medications: Scheduled:  guaiFENesin  600 mg Oral BID   insulin aspart  0-9 Units Subcutaneous Q4H   metoprolol tartrate  25 mg Oral BID   sacubitril-valsartan  1 tablet Oral BID   sodium chloride flush  3 mL Intravenous Q12H   spironolactone  12.5 mg Oral Daily   Warfarin - Pharmacist Dosing Inpatient   Does not apply q1600   Continuous:  Objective:  Vital Signs  Vitals:   06/06/23 2000 06/06/23 2332 06/07/23 0330 06/07/23 0817  BP: (!) 122/99 105/75 105/72 (!) 112/91  Pulse: 81 71 74 85  Resp: (!) 21 20 20 17   Temp: 98 F (36.7 C) 98.3 F (36.8 C) 97.9 F (36.6 C)   TempSrc: Oral Oral Oral   SpO2: 100% 100% 100% 100%  Weight:      Height:  Intake/Output Summary (Last 24 hours) at 06/07/2023 0852 Last data filed at 06/07/2023 0331 Gross per 24 hour  Intake 1330 ml  Output 400 ml  Net 930 ml    Filed Weights   05/31/23 0037  Weight: 116.3 kg    Exam  Awake Alert, No new F.N deficits, Normal affect [place].AT,PERRAL Supple Neck, No JVD,   Symmetrical Chest wall  movement, Good air movement bilaterally, few rales RRR,No Gallops, Rubs or new Murmurs,  +ve B.Sounds, Abd Soft, No tenderness,   No Cyanosis, Clubbing , no edema   Lab Results:  Data Reviewed: I have personally reviewed following labs and reports of the imaging studies Recent Labs  Lab 06/03/23 0545 06/04/23 0538 06/05/23 0512 06/06/23 0442 06/07/23 0436  WBC 10.8* 10.5 9.0 9.6 9.0  HGB 15.0 15.3* 15.8* 15.6* 15.1*  HCT 46.0 46.8* 48.6* 48.5* 45.3  PLT 166 163 182 194 201  MCV 96.4 98.1 98.0 97.6 95.8  MCH 31.4 32.1 31.9 31.4 31.9  MCHC 32.6 32.7 32.5 32.2 33.3  RDW 13.5 13.7 13.8 13.5 13.5  LYMPHSABS  --   --  1.1 1.2 1.1  MONOABS  --   --  0.9 0.7 0.4  EOSABS  --   --  0.5 0.5 1.0*  BASOSABS  --   --  0.1 0.1 0.2*    Recent Labs  Lab 05/31/23 0800 05/31/23 0919 05/31/23 1226 06/01/23 1610 06/03/23 0545 06/04/23 0538 06/04/23 0835 06/05/23 0512 06/05/23 0952 06/06/23 0442 06/07/23 0436  NA  --   --   --    < > 135 139  --  139  --  137 136  K  --   --   --    < > 3.6 3.8  --  3.9  --  3.9 3.9  CL  --   --   --    < > 94* 95*  --  93*  --  93* 95*  CO2  --   --   --    < > 29 32  --  31  --  30 30  ANIONGAP  --   --   --    < > 12 12  --  15  --  14 11  GLUCOSE  --   --   --    < > 112* 108*  --  108*  --  99 106*  BUN  --   --   --    < > 28* 35*  --  38*  --  49* 53*  CREATININE  --   --   --    < > 1.07* 1.15*  --  1.18*  --  1.47* 1.28*  CRP  --   --   --   --   --   --  11.4* 8.6*  --  5.4* 3.3*  PROCALCITON 12.40  --   --   --   --  3.26  --  1.66  --  1.23 0.71  LATICACIDVEN  --  2.0* 2.6*  --   --   --   --   --   --   --   --   INR  --   --   --    < > 2.6* 2.9*  --  4.2* 4.2* 4.0* 3.1*  TSH 1.045  --   --   --   --   --   --   --   --   --   --  HGBA1C 5.8*  --   --   --   --   --   --   --   --   --   --   BNP  --   --   --   --   --  68.5  --  83.7  --  63.7 81.1  MG  --   --   --    < > 1.8  --  2.0 2.1  --  2.2 2.2  PHOS  --   --   --   --    --   --   --  3.8  --  4.1 4.0  CALCIUM  --   --   --    < > 8.8* 8.8*  --  9.2  --  9.3 9.0   < > = values in this interval not displayed.      Recent Labs  Lab 05/31/23 0800 05/31/23 0919 05/31/23 1226 06/01/23 9811 06/03/23 0545 06/04/23 9147 06/04/23 8295 06/05/23 0512 06/05/23 0952 06/06/23 0442 06/07/23 0436  CRP  --   --   --   --   --   --  11.4* 8.6*  --  5.4* 3.3*  PROCALCITON 12.40  --   --   --   --  3.26  --  1.66  --  1.23 0.71  LATICACIDVEN  --  2.0* 2.6*  --   --   --   --   --   --   --   --   INR  --   --   --    < > 2.6* 2.9*  --  4.2* 4.2* 4.0* 3.1*  TSH 1.045  --   --   --   --   --   --   --   --   --   --   HGBA1C 5.8*  --   --   --   --   --   --   --   --   --   --   BNP  --   --   --   --   --  68.5  --  83.7  --  63.7 81.1  MG  --   --   --    < > 1.8  --  2.0 2.1  --  2.2 2.2  CALCIUM  --   --   --    < > 8.8* 8.8*  --  9.2  --  9.3 9.0   < > = values in this interval not displayed.    Lab Results  Component Value Date   HGBA1C 5.8 (H) 05/31/2023   Micro Results Recent Results (from the past 240 hours)  Culture, blood (Routine X 2) w Reflex to ID Panel     Status: None   Collection Time: 05/31/23  7:40 AM   Specimen: BLOOD LEFT HAND  Result Value Ref Range Status   Specimen Description BLOOD LEFT HAND  Final   Special Requests   Final    BOTTLES DRAWN AEROBIC AND ANAEROBIC Blood Culture adequate volume   Culture   Final    NO GROWTH 5 DAYS Performed at [facility], [address] [place], [place] [address]    Report Status 06/05/2023 FINAL  Final  Resp panel by RT-PCR (RSV, Flu A&B, Covid) Anterior Nasal Swab     Status: None   Collection Time: 05/31/23  7:42 AM  Specimen: Anterior Nasal Swab  Result Value Ref Range Status   SARS Coronavirus 2 by RT PCR NEGATIVE NEGATIVE Final   Influenza A by PCR NEGATIVE NEGATIVE Final   Influenza B by PCR NEGATIVE NEGATIVE Final    Comment: (NOTE) The Xpert Xpress SARS-CoV-2/FLU/RSV plus  assay is intended as an aid in the diagnosis of influenza from Nasopharyngeal swab specimens and should not be used as a sole basis for treatment. Nasal washings and aspirates are unacceptable for Xpert Xpress SARS-CoV-2/FLU/RSV testing.  Fact Sheet for Patients: BloggerCourse.com  Fact Sheet for Healthcare Providers: SeriousBroker.it  This test is not yet approved or cleared by the [place] FDA and has been authorized for detection and/or diagnosis of SARS-CoV-2 by FDA under an Emergency Use Authorization (EUA). This EUA will remain in effect (meaning this test can be used) for the duration of the COVID-19 declaration under Section 564(b)(1) of the Act, 21 U.S.C. section 360bbb-3(b)(1), unless the authorization is terminated or revoked.     Resp Syncytial Virus by PCR NEGATIVE NEGATIVE Final    Comment: (NOTE) Fact Sheet for Patients: BloggerCourse.com  Fact Sheet for Healthcare Providers: SeriousBroker.it  This test is not yet approved or cleared by the [place] FDA and has been authorized for detection and/or diagnosis of SARS-CoV-2 by FDA under an Emergency Use Authorization (EUA). This EUA will remain in effect (meaning this test can be used) for the duration of the COVID-19 declaration under Section 564(b)(1) of the Act, 21 U.S.C. section 360bbb-3(b)(1), unless the authorization is terminated or revoked.  Performed at [facility], [address] [place], [place] [address]   Culture, blood (Routine X 2) w Reflex to ID Panel     Status: None   Collection Time: 05/31/23  8:30 AM   Specimen: BLOOD RIGHT WRIST  Result Value Ref Range Status   Specimen Description BLOOD RIGHT WRIST  Final   Special Requests   Final    BOTTLES DRAWN AEROBIC AND ANAEROBIC Blood Culture adequate volume   Culture   Final    NO GROWTH 5 DAYS Performed at [facility]  Lab, [address], [place], [place] [address]    Report Status 06/05/2023 FINAL  Final    Radiology Reports  No results found.      LOS: 7 days   Signature  -    Susa Raring M.D on 06/07/2023 at 8:52 AM   -  To page go to [email or web address]

## 2023-06-07 NOTE — Interval H&P Note (Signed)
 History and Physical Interval Note:  06/07/2023 2:32 PM  [name]  has presented today for surgery, with the diagnosis of heart failure.  The various methods of treatment have been discussed with the patient and family. After consideration of risks, benefits and other options for treatment, the patient has consented to  Procedure(s): RIGHT HEART CATH (N/A) as a surgical intervention.  The patient's history has been reviewed, patient examined, no change in status, stable for surgery.  I have reviewed the patient's chart and labs.  Questions were answered to the patient's satisfaction.     Marieanne Marxen [employer]

## 2023-06-07 NOTE — Plan of Care (Addendum)
 RHC demonstrates normal filling pressures. She had no significant increased wedge pressure.  She does not need further IV diuresis. She may have precapillary PHTN from long standing mitral valve disease with PVR of 4.3 WU. She may need home O2. Otherwise, no other anticipated changes from cardiology. She can continue lasix 40 mg and take daily. We will work on FU. Cardiology will sign off.

## 2023-06-07 NOTE — H&P (View-Only) (Signed)
 Patient Name: [name] Date of Encounter: 06/07/2023 [facility] Cardiologist: Rollene Rotunda, MD   Interval Summary  .    Patient continues to be on 3 L supplemental oxygen. She was able to walk in the halls yesterday and her breathing was OK during activity. She denies shortness of breath at rest. No chest pain, palpitations.   Vital Signs .    Vitals:   06/06/23 1700 06/06/23 2000 06/06/23 2332 06/07/23 0330  BP: (!) 101/90 (!) 122/99 105/75 105/72  Pulse: 78 81 71 74  Resp:  (!) 21 20 20   Temp: 98.2 F (36.8 C) 98 F (36.7 C) 98.3 F (36.8 C) 97.9 F (36.6 C)  TempSrc: Oral Oral Oral Oral  SpO2: 95% 100% 100% 100%  Weight:      Height:        Intake/Output Summary (Last 24 hours) at 06/07/2023 0813 Last data filed at 06/07/2023 0331 Gross per 24 hour  Intake 1330 ml  Output 400 ml  Net 930 ml      05/31/2023   12:37 AM 06/03/2022    5:00 AM 06/02/2022    5:00 AM  Last 3 Weights  Weight (lbs) 256 lb 6.3 oz 256 lb 6.3 oz 253 lb 8.5 oz  Weight (kg) 116.3 kg 116.3 kg 115 kg      Telemetry/ECG    Atrial Fibrillation with HR in the 70s-80s - Personally Reviewed  Physical Exam .   GEN: No acute distress.  Sitting upright in the bed  Neck: No JVD Cardiac: Irregular rate and rhythm. no murmurs, rubs, or gallops.  Respiratory: Crackles in bilateral lung bases. Normal WOB on 3 L via Criv[place]  GI: Soft, nontender, non-distended  MS: No edema in BLE   Assessment & Plan .     Chronic HFpEF Pulmonary Edema with hypoxic respiratory failure  - Previous echocardiogram from 01/2022 showed EF 45-50% with global hypokinesis, normal RV function. Echocardiogram this admission showed EF 55%, no wall motion abnormalities, normal RV function  - Patient initially required BiPAP. BNP elevated to 715 on 3/5. Has since trended down to 63 - CXR on 3.5 showed hazy opacities projecting over the lower lungs, suspicious for pneumonia or edema. CXR on 3/9 showed improving  pulmonary edema  - Patient was on IV lasix until yesterday when her creatinine bumped 1.18>1.47. Lasix and spiro were held. This AM, creatinine improved to 1.28  - Patient appears euvolemic on exam, but continues to be on 3 L supplemental oxygen. She continues to feel a bit short of breath as well. Could consider RHC to better assess volume status and guide further diuresis. Will discuss with MD. - Attempt to wean oxygen  - Continue metoprolol tartrate 25 mg BID - BP has been running low-normal, 105/72 this AM. 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 Suspect demand ischemia from hypoxic respiratory failure    Lupus coagulant disorder  History of DVT/PE  - Continue coumadin    Otherwise per  primary  - Possible bronchitis vs early community-acquired pneumonia  - CKD stage 3a - Obesity class 3     For questions or updates, please contact [facility] Please consult [email or web address] for contact info under        Signed, Jonita Albee, PA-C

## 2023-06-07 NOTE — Progress Notes (Signed)
 PHARMACY - ANTICOAGULATION CONSULT NOTE  Pharmacy Consult for warfarin Indication: atrial fibrillation and hx DVT/PE  Allergies  Allergen Reactions   Heparin Hives    Patient attests severe allergy- rash all over, severe itching, unable to take heparin  Can take lovenox   Other Itching and Rash    "Sheets and Linens used by [name]" Mainly while getting the heparin    Patient Measurements: Height: 5\' 5"  (165.1 cm) Weight: 116.3 kg (256 lb 6.3 oz) IBW/kg (Calculated) : 57   Vital Signs: Temp: 97.9 F (36.6 C) (03/12 0330) Temp Source: Oral (03/12 0330) BP: 112/91 (03/12 0912) Pulse Rate: 85 (03/12 0912)  Labs: Recent Labs    06/05/23 0512 06/05/23 0952 06/06/23 0442 06/07/23 0436  HGB 15.8*  --  15.6* 15.1*  HCT 48.6*  --  48.5* 45.3  PLT 182  --  194 201  LABPROT 41.0* 40.4* 39.3* 31.8*  INR 4.2* 4.2* 4.0* 3.1*  CREATININE 1.18*  --  1.47* 1.28*    Estimated Creatinine Clearance: 46.9 mL/min (A) (by C-G formula based on SCr of 1.28 mg/dL (H)).   Medical History: Past Medical History:  Diagnosis Date   (HFpEF) heart failure with preserved ejection fraction (HCC)    a. 06/2008 Echo: EF 55-60%, mild LVH, triv AI, mild to mod MS, mod to sev dil LA, mildly dil RA.   Acute right MCA stroke (HCC)    a. 06/04/2010 Embolic stroke treated with TPA and thrombectomy with hemorrhagic transformation; Carotids 3/12 negative for ICA stenosis.   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 08/2005 - s/p IVC filter.   CHF (congestive heart failure) (HCC)    Depression    Embolus of femoral artery (HCC)    a. 06/2008 s/p bilateral embolectomies.   History of DVT (deep vein thrombosis)    a. s/p IVC filter.   HIT (heparin-induced thrombocytopenia) (HCC)    HTN (hypertension)    Lupus anticoagulant disorder (HCC)    Mitral stenosis    moderate-severe 09/04/20 echo   Obesity, unspecified    Permanent atrial fibrillation  (HCC)    a. On chronic Coumadin - INRs followed by PCP; b. CHA2DS2VASc = 5.   Popliteal artery embolism, right (HCC)    a. 01/2017 in the setting of subRx INR-->s/p embolectomy.    Assessment: 38 yoF with PHM of atrial fibrillaiton and PE on warfarin PTA (admit INR 2.3) last dose 3/4. CBC stable. Pharmacy consulted to manage warfarin.   3/12 AM: INR near goal at 3.1 after holding 3/10 and 3/11 doses.  CBC remains stable.   Home warfarin: 3mg  Mon, Wed, Fri, Sun and 4.5mg  all other days   Goal of Therapy:  Heparin level 0.3-0.7 units/ml Monitor platelets by anticoagulation protocol: Yes   Plan:  Resume Warfarin - start with 3mg  dose per outpt schedule. Monitor INR and CBC daily   [employer], Pharm.D., BCPS Clinical Pharmacist Clinical phone for 06/07/2023 from 7:30-3:00 is [phone].  **Pharmacist phone directory can be found on [email or web address] listed under [facility].  06/07/2023 9:37 AM

## 2023-06-07 NOTE — Progress Notes (Signed)
 Patient arrived at the unit in stretcher from the cathlab,pt ambulate in the room till her bed,rt brachial site level 0,pt is alert and oriented X 4,Vitals taken,CCMD notified,call bell in reach

## 2023-06-07 NOTE — Progress Notes (Signed)
 PT Cancellation Note  Patient Details Name: [name] MRN: [id number] DOB: 11-13-1945   Cancelled Treatment:    Reason Eval/Treat Not Completed: Patient at procedure or test/unavailable (Transport arrived to take pt to Rt heart cath. Will follow up at later date/time as pt able and schedule allows.)    Renaldo Fiddler PT, DPT Acute Rehabilitation Services Office [phone]  06/07/23 2:06 PM

## 2023-06-07 NOTE — Progress Notes (Addendum)
 Patient Name: [name] Date of Encounter: 06/07/2023 [facility] Cardiologist: Rollene Rotunda, MD   Interval Summary  .    Patient continues to be on 3 L supplemental oxygen. She was able to walk in the halls yesterday and her breathing was OK during activity. She denies shortness of breath at rest. No chest pain, palpitations.   Vital Signs .    Vitals:   06/06/23 1700 06/06/23 2000 06/06/23 2332 06/07/23 0330  BP: (!) 101/90 (!) 122/99 105/75 105/72  Pulse: 78 81 71 74  Resp:  (!) 21 20 20   Temp: 98.2 F (36.8 C) 98 F (36.7 C) 98.3 F (36.8 C) 97.9 F (36.6 C)  TempSrc: Oral Oral Oral Oral  SpO2: 95% 100% 100% 100%  Weight:      Height:        Intake/Output Summary (Last 24 hours) at 06/07/2023 0813 Last data filed at 06/07/2023 0331 Gross per 24 hour  Intake 1330 ml  Output 400 ml  Net 930 ml      05/31/2023   12:37 AM 06/03/2022    5:00 AM 06/02/2022    5:00 AM  Last 3 Weights  Weight (lbs) 256 lb 6.3 oz 256 lb 6.3 oz 253 lb 8.5 oz  Weight (kg) 116.3 kg 116.3 kg 115 kg      Telemetry/ECG    Atrial Fibrillation with HR in the 70s-80s - Personally Reviewed  Physical Exam .   GEN: No acute distress.  Sitting upright in the bed  Neck: No JVD Cardiac: Irregular rate and rhythm. no murmurs, rubs, or gallops.  Respiratory: Crackles in bilateral lung bases. Normal WOB on 3 L via Crivitz  GI: Soft, nontender, non-distended  MS: No edema in BLE   Assessment & Plan .     Chronic HFpEF Pulmonary Edema with hypoxic respiratory failure  - Previous echocardiogram from 01/2022 showed EF 45-50% with global hypokinesis, normal RV function. Echocardiogram this admission showed EF 55%, no wall motion abnormalities, normal RV function  - Patient initially required BiPAP. BNP elevated to 715 on 3/5. Has since trended down to 63 - CXR on 3.5 showed hazy opacities projecting over the lower lungs, suspicious for pneumonia or edema. CXR on 3/9 showed improving  pulmonary edema  - Patient was on IV lasix until yesterday when her creatinine bumped 1.18>1.47. Lasix and spiro were held. This AM, creatinine improved to 1.28  - Patient appears euvolemic on exam, but continues to be on 3 L supplemental oxygen. She continues to feel a bit short of breath as well. Could consider RHC to better assess volume status and guide further diuresis. Will discuss with MD. - Attempt to wean oxygen  - Continue metoprolol tartrate 25 mg BID - BP has been running low-normal, 105/72 this AM. 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 Suspect demand ischemia from hypoxic respiratory failure    Lupus coagulant disorder  History of DVT/PE  - Continue coumadin    Otherwise per  primary  - Possible bronchitis vs early community-acquired pneumonia  - CKD stage 3a - Obesity class 3     For questions or updates, please contact [facility] Please consult [email or web address] for contact info under        Signed, Jonita Albee, PA-C

## 2023-06-08 ENCOUNTER — Other Ambulatory Visit (HOSPITAL_COMMUNITY): Payer: Self-pay

## 2023-06-08 ENCOUNTER — Encounter (HOSPITAL_COMMUNITY): Payer: Self-pay | Admitting: Cardiology

## 2023-06-08 DIAGNOSIS — I5033 Acute on chronic diastolic (congestive) heart failure: Secondary | ICD-10-CM | POA: Diagnosis not present

## 2023-06-08 LAB — GLUCOSE, CAPILLARY
Glucose-Capillary: 121 mg/dL — ABNORMAL HIGH (ref 70–99)
Glucose-Capillary: 123 mg/dL — ABNORMAL HIGH (ref 70–99)
Glucose-Capillary: 127 mg/dL — ABNORMAL HIGH (ref 70–99)
Glucose-Capillary: 127 mg/dL — ABNORMAL HIGH (ref 70–99)
Glucose-Capillary: 131 mg/dL — ABNORMAL HIGH (ref 70–99)

## 2023-06-08 LAB — C-REACTIVE PROTEIN: CRP: 2.3 mg/dL — ABNORMAL HIGH (ref <1.0)

## 2023-06-08 LAB — CBC WITH DIFFERENTIAL/PLATELET
Abs Immature Granulocytes: 0.1 10*3/uL — ABNORMAL HIGH (ref 0.00–0.07)
Basophils Absolute: 0.2 10*3/uL — ABNORMAL HIGH (ref 0.0–0.1)
Basophils Relative: 2 %
Eosinophils Absolute: 0.4 10*3/uL (ref 0.0–0.5)
Eosinophils Relative: 5 %
HCT: 45.2 % (ref 36.0–46.0)
Hemoglobin: 14.9 g/dL (ref 12.0–15.0)
Lymphocytes Relative: 20 %
Lymphs Abs: 1.5 10*3/uL (ref 0.7–4.0)
MCH: 31.8 pg (ref 26.0–34.0)
MCHC: 33 g/dL (ref 30.0–36.0)
MCV: 96.4 fL (ref 80.0–100.0)
Metamyelocytes Relative: 1 %
Monocytes Absolute: 0.4 10*3/uL (ref 0.1–1.0)
Monocytes Relative: 5 %
Neutro Abs: 5.2 10*3/uL (ref 1.7–7.7)
Neutrophils Relative %: 67 %
Platelets: 209 10*3/uL (ref 150–400)
RBC: 4.69 MIL/uL (ref 3.87–5.11)
RDW: 13.5 % (ref 11.5–15.5)
WBC: 7.7 10*3/uL (ref 4.0–10.5)
nRBC: 0 % (ref 0.0–0.2)
nRBC: 0 /100{WBCs}

## 2023-06-08 LAB — BASIC METABOLIC PANEL
Anion gap: 8 (ref 5–15)
BUN: 46 mg/dL — ABNORMAL HIGH (ref 8–23)
CO2: 30 mmol/L (ref 22–32)
Calcium: 9 mg/dL (ref 8.9–10.3)
Chloride: 96 mmol/L — ABNORMAL LOW (ref 98–111)
Creatinine, Ser: 1.26 mg/dL — ABNORMAL HIGH (ref 0.44–1.00)
GFR, Estimated: 44 mL/min — ABNORMAL LOW (ref >60)
Glucose, Bld: 115 mg/dL — ABNORMAL HIGH (ref 70–99)
Potassium: 4.2 mmol/L (ref 3.5–5.1)
Sodium: 134 mmol/L — ABNORMAL LOW (ref 135–145)

## 2023-06-08 LAB — BRAIN NATRIURETIC PEPTIDE: B Natriuretic Peptide: 144.9 pg/mL — ABNORMAL HIGH (ref 0.0–100.0)

## 2023-06-08 LAB — PROTIME-INR
INR: 2.5 — ABNORMAL HIGH (ref 0.8–1.2)
Prothrombin Time: 27.3 s — ABNORMAL HIGH (ref 11.4–15.2)

## 2023-06-08 LAB — PHOSPHORUS: Phosphorus: 3.5 mg/dL (ref 2.5–4.6)

## 2023-06-08 LAB — PROCALCITONIN: Procalcitonin: 0.34 ng/mL

## 2023-06-08 LAB — MAGNESIUM: Magnesium: 2.4 mg/dL (ref 1.7–2.4)

## 2023-06-08 MED ORDER — SPIRONOLACTONE 25 MG PO TABS
12.5000 mg | ORAL_TABLET | Freq: Every day | ORAL | 0 refills | Status: DC
  Filled 2023-06-08: qty 45, 90d supply, fill #0

## 2023-06-08 MED ORDER — FUROSEMIDE 40 MG PO TABS: 40.0000 mg | ORAL_TABLET | Freq: Every day | ORAL | Status: DC | PRN

## 2023-06-08 MED ORDER — WARFARIN SODIUM 3 MG PO TABS: 3.0000 mg | ORAL_TABLET | ORAL | Status: AC

## 2023-06-08 MED ORDER — SACUBITRIL-VALSARTAN 24-26 MG PO TABS
1.0000 | ORAL_TABLET | Freq: Two times a day (BID) | ORAL | 0 refills | Status: DC
  Filled 2023-06-08: qty 60, 30d supply, fill #0

## 2023-06-08 NOTE — Progress Notes (Signed)
 Physical Therapy Treatment Patient Details Name: MARIONNA GONIA MRN: [id number] DOB: [birth year] Today's Date: 06/08/2023   History of Present Illness Pt is a 78 y/o F presenting to ED On 3/5 wtih vomiting and SOB, admitted for acute on chronic CHF and possible CAP. Rt heart cath via Rt brachial artery 3/12. 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 Unable to get O2 saturation reading while ambulating despite changing out probe and trying 2 different machines. Once seated, it would take 2 minutes for good pleth and reading of 94%. MD and RN made aware.     If plan is discharge home, recommend the following: Assist for transportation;Help with stairs or ramp for entrance   Can travel by private vehicle        Equipment Recommendations  None recommended by PT    Recommendations for Other Services       Precautions / Restrictions Precautions Precautions: Fall Precaution/Restrictions Comments: watch O2 Restrictions Weight Bearing Restrictions Per Provider Order: No     Mobility  Bed Mobility Overal bed mobility: Needs Assistance Bed Mobility: Sit to Supine, Supine to Sit     Supine to sit: Independent Sit to supine: Independent        Transfers Overall transfer level: Needs assistance Equipment used: Rolling walker (2 wheels) Transfers: Sit to/from Stand, Bed to chair/wheelchair/BSC Sit to Stand: Contact guard assist, Min assist   Step pivot transfers: Contact guard assist       General transfer comment: CGA from EOB and recliner; min from toilet (standard)    Ambulation/Gait Ambulation/Gait assistance: Contact guard assist Gait Distance (Feet): 100 Feet Assistive device: Rolling walker (2 wheels) Gait Pattern/deviations: Step-through pattern, Decreased stride  length, Wide base of support, Trunk flexed Gait velocity: decreased     General Gait Details: MIn UE support on RW with flexed trunk and uneven steps.   Stairs             Wheelchair Mobility     Tilt Bed    Modified Rankin (Stroke Patients Only)       Balance Overall balance assessment: Needs assistance Sitting-balance support: Feet supported Sitting balance-Leahy Scale: Good     Standing balance support: Reliant on assistive device for balance, During functional activity Standing balance-Leahy Scale: Poor Standing balance comment: reliant on RW support                            Communication Communication Communication: No apparent difficulties  Cognition Arousal: Alert Behavior During Therapy: WFL for tasks assessed/performed   PT - Cognitive impairments: No apparent impairments                         Following commands: Intact      Cueing    Exercises      General Comments General comments (skin integrity, edema, etc.): sats 91% at rest on RA; unable to get reading when ambulating despite trying 2 probes and 2 machines; once seated pleth would read after ~2 minutes 94%      Pertinent Vitals/Pain Pain Assessment Pain Assessment: No/denies pain    Home Living  Prior Function            PT Goals (current goals can now be found in the care plan section) Acute Rehab PT Goals Patient Stated Goal: to improve mobility Time For Goal Achievement: 06/17/23 Potential to Achieve Goals: Good Progress towards PT goals: Progressing toward goals    Frequency    Min 2X/week      PT Plan      Co-evaluation              AM-PAC PT "6 Clicks" Mobility   Outcome Measure  Help needed turning from your back to your side while in a flat bed without using bedrails?: None Help needed moving from lying on your back to sitting on the side of a flat bed without using bedrails?: None Help  needed moving to and from a bed to a chair (including a wheelchair)?: A Little Help needed standing up from a chair using your arms (e.g., wheelchair or bedside chair)?: A Little Help needed to walk in hospital room?: A Little Help needed climbing 3-5 steps with a railing? : A Little 6 Click Score: 20    End of Session Equipment Utilized During Treatment: Gait belt Activity Tolerance: Patient tolerated treatment well Patient left: in bed;with call bell/phone within reach Nurse Communication: Mobility status;Other (comment) (issues with ambulatory sats) PT Visit Diagnosis: Other abnormalities of gait and mobility (R26.89)     Time: 1610-9604 PT Time Calculation (min) (ACUTE ONLY): 44 min  Charges:    $Gait Training: 23-37 mins $Therapeutic Activity: 8-22 mins PT General Charges $$ ACUTE PT VISIT: 1 Visit                      Jerolyn Center, PT Acute Rehabilitation Services  Office [phone]    Zena Amos 06/08/2023, 11:13 AM

## 2023-06-08 NOTE — Plan of Care (Signed)

## 2023-06-08 NOTE — Plan of Care (Signed)
 Problem: Education: Goal: Ability to describe self-care measures that may prevent or decrease complications (Diabetes Survival Skills Education) will improve Outcome: Adequate for Discharge Goal: Individualized Educational Video(s) Outcome: Adequate for Discharge   Problem: Coping: Goal: Ability to adjust to condition or change in health will improve Outcome: Adequate for Discharge   Problem: Fluid Volume: Goal: Ability to maintain a balanced intake and output will improve Outcome: Adequate for Discharge   Problem: Health Behavior/Discharge Planning: Goal: Ability to identify and utilize available resources and services will improve Outcome: Adequate for Discharge Goal: Ability to manage health-related needs will improve Outcome: Adequate for Discharge   Problem: Metabolic: Goal: Ability to maintain appropriate glucose levels will improve Outcome: Adequate for Discharge   Problem: Nutritional: Goal: Maintenance of adequate nutrition will improve Outcome: Adequate for Discharge Goal: Progress toward achieving an optimal weight will improve Outcome: Adequate for Discharge   Problem: Skin Integrity: Goal: Risk for impaired skin integrity will decrease Outcome: Adequate for Discharge   Problem: Tissue Perfusion: Goal: Adequacy of tissue perfusion will improve Outcome: Adequate for Discharge   Problem: Education: Goal: Knowledge of General Education information will improve Description: Including pain rating scale, medication(s)/side effects and non-pharmacologic comfort measures Outcome: Adequate for Discharge   Problem: Health Behavior/Discharge Planning: Goal: Ability to manage health-related needs will improve Outcome: Adequate for Discharge   Problem: Clinical Measurements: Goal: Ability to maintain clinical measurements within normal limits will improve Outcome: Adequate for Discharge Goal: Will remain free from infection Outcome: Adequate for Discharge Goal:  Diagnostic test results will improve Outcome: Adequate for Discharge Goal: Respiratory complications will improve Outcome: Adequate for Discharge Goal: Cardiovascular complication will be avoided Outcome: Adequate for Discharge   Problem: Activity: Goal: Risk for activity intolerance will decrease Outcome: Adequate for Discharge   Problem: Nutrition: Goal: Adequate nutrition will be maintained Outcome: Adequate for Discharge   Problem: Coping: Goal: Level of anxiety will decrease Outcome: Adequate for Discharge   Problem: Elimination: Goal: Will not experience complications related to bowel motility Outcome: Adequate for Discharge Goal: Will not experience complications related to urinary retention Outcome: Adequate for Discharge   Problem: Pain Managment: Goal: General experience of comfort will improve and/or be controlled Outcome: Adequate for Discharge   Problem: Safety: Goal: Ability to remain free from injury will improve Outcome: Adequate for Discharge   Problem: Skin Integrity: Goal: Risk for impaired skin integrity will decrease Outcome: Adequate for Discharge   Problem: Acute Rehab OT Goals (only OT should resolve) Goal: Pt. Will Perform Upper Body Dressing Outcome: Adequate for Discharge Goal: Pt. Will Perform Lower Body Dressing Outcome: Adequate for Discharge Goal: Pt. Will Transfer To Toilet Outcome: Adequate for Discharge Goal: OT Additional ADL Goal #2 Outcome: Adequate for Discharge   Problem: Acute Rehab PT Goals(only PT should resolve) Goal: Pt Will Go Supine/Side To Sit Outcome: Adequate for Discharge Goal: Pt Will Go Sit To Supine/Side Outcome: Adequate for Discharge Goal: Patient Will Transfer Sit To/From Stand Outcome: Adequate for Discharge Goal: Pt Will Ambulate Outcome: Adequate for Discharge   Problem: Education: Goal: Understanding of CV disease, CV risk reduction, and recovery process will improve Outcome: Adequate for  Discharge Goal: Individualized Educational Video(s) Outcome: Adequate for Discharge   Problem: Activity: Goal: Ability to return to baseline activity level will improve Outcome: Adequate for Discharge   Problem: Cardiovascular: Goal: Ability to achieve and maintain adequate cardiovascular perfusion will improve Outcome: Adequate for Discharge Goal: Vascular access site(s) Level 0-1 will be maintained Outcome: Adequate for Discharge  Problem: Health Behavior/Discharge Planning: Goal: Ability to safely manage health-related needs after discharge will improve Outcome: Adequate for Discharge

## 2023-06-08 NOTE — Discharge Summary (Addendum)
 Physician Discharge Summary  [name]:[id number] DOB: 08/06/1945 DOA: 05/31/2023  PCP: Marden Noble, MD (Inactive)  Admit date: 05/31/2023 Discharge date: 06/08/2023  Admitted From: home Discharge disposition:  home  Recommendations for Outpatient Follow-Up:   Home health Cbc/bmp 1 week Close cards follow up for adjustment of medications INR check next week  Discharge Diagnosis:   Principal Problem:   Acute on chronic diastolic CHF (congestive heart failure) (HCC) Active Problems:   Chronic atrial fibrillation with RVR (HCC)   Essential hypertension   CKD stage 3a, GFR 45-59 ml/min (HCC)   Pre-diabetes   History of lupus anticoagulant disorder   Obesity, class 3    Discharge Condition: Improved.  Diet recommendation: Low sodium, heart healthy.  Carbohydrate-modified.   Wound care: None.  Code status: Full.   History of Present Illness:   [name] is a 78 y.o. female with medical history significant of hypertension, heart failure with mildly reduced EF, atrial fibrillation on chronic anticoagulation, mitral stenosis, CVA, history of DVT/PE, HIT, lupus anticoagulant disorder, depression, and morbid obesity presents with vomiting and shortness of breath. She is accompanied by her daughter.   She experienced sudden onset vomiting and shortness of breath after waking up from a nap. No chest pain, cough, lightheadedness, dizziness, or fever. This is an unusual occurrence for her and has not happened before.   She has a history of atrial fibrillation and moderate to severe mitral stenosis. She is not on oxygen therapy at baseline or use inhalers. No recent leg swelling and has not been around anyone sick recently, although her sister was sick but she has not been in contact with her.   She is currently urinating without any burning sensation.   She uses a walker for mobility.   EMS found the patient have O2 saturations as low as 70% room air  patient was placed on CPAP with improvement of O2 saturations   Plan admission into the emergency department patient was noted to be afebrile with heart rates elevated into the 140s, respirations 25-39, blood pressures elevated up to 164/57, and O2 saturations currently maintained after being switched over to BiPAP.  Labs significant for WBC 11.5, CO2 22, BUN 17, creatinine 1.23, glucose 295, anion gap 17, INR 2.3, BNP 715.1, high-sensitivity troponin 135 ->128.  Chest x-ray noted hazy opacities projecting over the lower lungs suspicious for pneumonia or edema.  Arterial blood gas noted pH 7.249, pCO2 57.7, and pO2 356.  Patient was started on a nitroglycerin drip.   Hospital Course by Problem:   Chronic HFpEF Pulmonary Edema with hypoxic respiratory failure  - Previous echocardiogram from 01/2022 showed EF 45-50% with global hypokinesis, normal RV function. Echocardiogram this admission showed EF 55%, no wall motion abnormalities, normal RV function  - Patient initially required BiPAP. BNP elevated to 715 on 3/5. Has since trended down to 9 - s/p cath:  RHC demonstrates normal filling pressures. She had no significant increased wedge pressure. She does not need further IV diuresis. 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 Suspect demand ischemia from hypoxic  respiratory failure    History of lupus anticoagulant disorder History of DVT and PE Continue anticoagulation with warfarin    Obesity, class 3 Estimated body mass index is 42.67 kg/m as calculated from the following:   Height as of this encounter: 5\' 5"  (1.651 m).   Weight as of this encounter: 116.3 kg.    Medical Consultants:    cards  Discharge Exam:   Vitals:   06/08/23 0900 06/08/23 1156  BP: 128/78 (!) 90/57  Pulse: 61 82  Resp: (!) 21 19  Temp:    SpO2: 97% 94%   Vitals:   06/08/23 0359 06/08/23 0814 06/08/23 0900 06/08/23 1156  BP: 97/71 122/64 128/78 (!) 90/57  Pulse: 61 65 61 82  Resp: 20 18 (!) 21 19  Temp: 97.6 F (36.4 C) (!) 97.3 F (36.3 C)    TempSrc: Oral Oral  Oral  SpO2: 90% 97% 97% 94%  Weight:      Height:        General exam: Appears calm and comfortable.    The results of significant diagnostics from this hospitalization (including imaging, microbiology, ancillary and laboratory) are listed below for reference.     Procedures and Diagnostic Studies:   ECHOCARDIOGRAM COMPLETE Result Date: 05/31/2023    ECHOCARDIOGRAM REPORT   Patient Name:   [name] Date of Exam: 05/31/2023 Medical Rec #:  [id number]       Height:       65.0 in Accession #:    [id number]      Weight:       256.4 lb Date of Birth:  [birth year]       BSA:          2.198 m Patient Age:    77 years        BP:           153/95 mmHg Patient Gender: F               HR:           111 bpm. Exam Location:  Inpatient Procedure: 2D Echo, Color Doppler, Cardiac Doppler and Intracardiac            Opacification Agent (Both Spectral and Color Flow Doppler were            utilized during procedure). Indications:    I48.91* Unspecified atrial fibrillation  History:        Patient has prior history of Echocardiogram examinations. CHF,                 Arrythmias:Atrial Fibrillation; Risk Factors:Hypertension.  Sonographer:    Tiney Rouge RDCS Referring Phys: [id number] RONDELL A SMITH  IMPRESSIONS  1. Abnormal septal motion . Left ventricular ejection fraction, by estimation, is 55%. The left ventricle has normal function. The left ventricle has no regional wall motion abnormalities. There is mild left ventricular hypertrophy. Left ventricular diastolic parameters are indeterminate.  2. Right ventricular systolic function is normal. The right ventricular size is normal.  3. Left atrial size was severely dilated.  4. Right atrial size was moderately dilated.  5. Rheuamtic MV with mean gradient 14 peak 22.4 mmHg at HR 83  bpm and MVA 1.03 cm2. The mitral valve is rheumatic. Trivial mitral valve regurgitation. Moderate to severe mitral stenosis.  6. The aortic valve is tricuspid. There is moderate calcification of the aortic valve. There is moderate thickening of the aortic valve. Aortic valve regurgitation is mild. Aortic valve sclerosis is present, with no evidence of aortic valve stenosis.  7. The inferior vena cava is normal in size with greater than 50% respiratory variability, suggesting right atrial pressure of 3 mmHg. FINDINGS  Left Ventricle: Abnormal septal motion. Left ventricular ejection fraction, by estimation, is 55%. The left ventricle has normal function. The left ventricle has no regional wall motion abnormalities. Definity contrast agent was given IV to delineate the left ventricular endocardial borders. Strain was performed and the global longitudinal strain is indeterminate. The left ventricular internal cavity size was normal in size. There is mild left ventricular hypertrophy. Left ventricular diastolic parameters are indeterminate. Right Ventricle: The right ventricular size is normal. No increase in right ventricular wall thickness. Right ventricular systolic function is normal. Left Atrium: Left atrial size was severely dilated. Right Atrium: Right atrial size was moderately dilated. Pericardium: There is no evidence of pericardial effusion. Mitral Valve: Rheuamtic MV with  mean gradient 14 peak 22.4 mmHg at HR 83 bpm and MVA 1.03 cm2. The mitral valve is rheumatic. Trivial mitral valve regurgitation. Moderate to severe mitral valve stenosis. MV peak gradient, 22.4 mmHg. The mean mitral valve  gradient is 14.0 mmHg. Tricuspid Valve: The tricuspid valve is normal in structure. Tricuspid valve regurgitation is mild . No evidence of tricuspid stenosis. Aortic Valve: The aortic valve is tricuspid. There is moderate calcification of the aortic valve. There is moderate thickening of the aortic valve. Aortic valve regurgitation is mild. Aortic valve sclerosis is present, with no evidence of aortic valve stenosis. Aortic valve mean gradient measures 9.0 mmHg. Aortic valve peak gradient measures 15.4 mmHg. Aortic valve area, by VTI measures 1.14 cm. Pulmonic Valve: The pulmonic valve was normal in structure. Pulmonic valve regurgitation is not visualized. No evidence of pulmonic stenosis. Aorta: The aortic root is normal in size and structure. Venous: The inferior vena cava is normal in size with greater than 50% respiratory variability, suggesting right atrial pressure of 3 mmHg. IAS/Shunts: No atrial level shunt detected by color flow Doppler. Additional Comments: 3D was performed not requiring image post processing on an independent workstation and was indeterminate.  LEFT VENTRICLE PLAX 2D LVIDd:         4.10 cm   Diastology LVIDs:         3.20 cm   LV e' medial:   8.16 cm/s LV PW:         1.30 cm   LV E/e' medial: 27.0 LV IVS:        1.30 cm LVOT diam:     1.90 cm LV SV:         41 LV SV Index:   19 LVOT Area:     2.84 cm  IVC IVC diam: 2.10 cm LEFT ATRIUM              Index         RIGHT ATRIUM           Index LA diam:        6.40 cm  2.91 cm/m    RA Area:     32.90 cm LA Vol (A2C):   258.0 ml 117.36 ml/m  RA Volume:   107.00 ml 48.67 ml/m LA  Vol (A4C):   266.0 ml 121.00 ml/m LA Biplane Vol: 268.0 ml 121.91 ml/m  AORTIC VALVE AV Area (Vmax):    1.27 cm AV Area (Vmean):   1.22 cm  AV Area (VTI):     1.14 cm AV Vmax:           196.00 cm/s AV Vmean:          142.000 cm/s AV VTI:            0.363 m AV Peak Grad:      15.4 mmHg AV Mean Grad:      9.0 mmHg LVOT Vmax:         87.70 cm/s LVOT Vmean:        60.950 cm/s LVOT VTI:          0.146 m LVOT/AV VTI ratio: 0.40  AORTA Ao Root diam: 2.70 cm Ao Asc diam:  3.50 cm MITRAL VALVE MV Area (PHT): 1.03 cm     SHUNTS MV Area VTI:   0.71 cm     Systemic VTI:  0.15 m MV Peak grad:  22.4 mmHg    Systemic Diam: 1.90 cm MV Mean grad:  14.0 mmHg MV Vmax:       2.37 m/s MV Vmean:      180.0 cm/s MV Decel Time: 733 msec MV E velocity: 220.00 cm/s Charlton Haws MD Electronically signed by Charlton Haws MD Signature Date/Time: 05/31/2023/12:27:33 PM    Final    DG Chest Port 1 View Result Date: 05/31/2023 CLINICAL DATA:  Shortness of breath EXAM: PORTABLE CHEST 1 VIEW COMPARISON:  CT chest abdomen and pelvis 05/30/2022 FINDINGS: Cardiomegaly. Hazy opacities projecting over the lower lungs are due at least in part to soft tissue overlap however are suspicious for pneumonia or edema. No definite pleural effusion or pneumothorax. No displaced rib fractures. IMPRESSION: Hazy opacities projecting over the lower lungs are due at least in part to soft tissue overlap however are suspicious for pneumonia or edema. Electronically Signed   By: Minerva Fester M.D.   On: 05/31/2023 01:55     Labs:   Basic Metabolic Panel: Recent Labs  Lab 06/04/23 0538 06/04/23 0835 06/05/23 0512 06/06/23 0442 06/07/23 0436 06/07/23 1448 06/08/23 0309  NA 139  --  139 137 136 137  138 134*  K 3.8  --  3.9 3.9 3.9 4.5  4.4 4.2  CL 95*  --  93* 93* 95*  --  96*  CO2 32  --  31 30 30   --  30  GLUCOSE 108*  --  108* 99 106*  --  115*  BUN 35*  --  38* 49* 53*  --  46*  CREATININE 1.15*  --  1.18* 1.47* 1.28*  --  1.26*  CALCIUM 8.8*  --  9.2 9.3 9.0  --  9.0  MG  --  2.0 2.1 2.2 2.2  --  2.4  PHOS  --   --  3.8 4.1 4.0  --  3.5   GFR Estimated Creatinine  Clearance: 47.6 mL/min (A) (by C-G formula based on SCr of 1.26 mg/dL (H)). Liver Function Tests: No results for input(s): "AST", "ALT", "ALKPHOS", "BILITOT", "PROT", "ALBUMIN" in the last 168 hours. No results for input(s): "LIPASE", "AMYLASE" in the last 168 hours. No results for input(s): "AMMONIA" in the last 168 hours. Coagulation profile Recent Labs  Lab 06/05/23 0512 06/05/23 0952 06/06/23 0442 06/07/23 0436 06/08/23 0309  INR 4.2* 4.2* 4.0* 3.1* 2.5*  CBC: Recent Labs  Lab 06/04/23 0538 06/05/23 0512 06/06/23 0442 06/07/23 0436 06/07/23 1448 06/08/23 0309  WBC 10.5 9.0 9.6 9.0  --  7.7  NEUTROABS  --  6.3 7.1 6.4  --  5.2  HGB 15.3* 15.8* 15.6* 15.1* 16.3*  16.3* 14.9  HCT 46.8* 48.6* 48.5* 45.3 48.0*  48.0* 45.2  MCV 98.1 98.0 97.6 95.8  --  96.4  PLT 163 182 194 201  --  209   Cardiac Enzymes: No results for input(s): "CKTOTAL", "CKMB", "CKMBINDEX", "TROPONINI" in the last 168 hours. BNP: Invalid input(s): "POCBNP" CBG: Recent Labs  Lab 06/08/23 0000 06/08/23 0404 06/08/23 0619 06/08/23 0808 06/08/23 1151  GLUCAP 131* 127* 123* 121* 127*   D-Dimer No results for input(s): "DDIMER" in the last 72 hours. Hgb A1c No results for input(s): "HGBA1C" in the last 72 hours. Lipid Profile No results for input(s): "CHOL", "HDL", "LDLCALC", "TRIG", "CHOLHDL", "LDLDIRECT" in the last 72 hours. Thyroid function studies No results for input(s): "TSH", "T4TOTAL", "T3FREE", "THYROIDAB" in the last 72 hours.  Invalid input(s): "FREET3" Anemia work up No results for input(s): "VITAMINB12", "FOLATE", "FERRITIN", "TIBC", "IRON", "RETICCTPCT" in the last 72 hours. Microbiology Recent Results (from the past 240 hours)  Culture, blood (Routine X 2) w Reflex to ID Panel     Status: None   Collection Time: 05/31/23  7:40 AM   Specimen: BLOOD LEFT HAND  Result Value Ref Range Status   Specimen Description BLOOD LEFT HAND  Final   Special Requests   Final     BOTTLES DRAWN AEROBIC AND ANAEROBIC Blood Culture adequate volume   Culture   Final    NO GROWTH 5 DAYS Performed at [facility], [address] [place], [place] [address]    Report Status 06/05/2023 FINAL  Final  Resp panel by RT-PCR (RSV, Flu A&B, Covid) Anterior Nasal Swab     Status: None   Collection Time: 05/31/23  7:42 AM   Specimen: Anterior Nasal Swab  Result Value Ref Range Status   SARS Coronavirus 2 by RT PCR NEGATIVE NEGATIVE Final   Influenza A by PCR NEGATIVE NEGATIVE Final   Influenza B by PCR NEGATIVE NEGATIVE Final    Comment: (NOTE) The Xpert Xpress SARS-CoV-2/FLU/RSV plus assay is intended as an aid in the diagnosis of influenza from Nasopharyngeal swab specimens and should not be used as a sole basis for treatment. Nasal washings and aspirates are unacceptable for Xpert Xpress SARS-CoV-2/FLU/RSV testing.  Fact Sheet for Patients: BloggerCourse.com  Fact Sheet for Healthcare Providers: SeriousBroker.it  This test is not yet approved or cleared by the [place] FDA and has been authorized for detection and/or diagnosis of SARS-CoV-2 by FDA under an Emergency Use Authorization (EUA). This EUA will remain in effect (meaning this test can be used) for the duration of the COVID-19 declaration under Section 564(b)(1) of the Act, 21 U.S.C. section 360bbb-3(b)(1), unless the authorization is terminated or revoked.     Resp Syncytial Virus by PCR NEGATIVE NEGATIVE Final    Comment: (NOTE) Fact Sheet for Patients: BloggerCourse.com  Fact Sheet for Healthcare Providers: SeriousBroker.it  This test is not yet approved or cleared by the [place] FDA and has been authorized for detection and/or diagnosis of SARS-CoV-2 by FDA under an Emergency Use Authorization (EUA). This EUA will remain in effect (meaning this test can be used) for the duration of  the COVID-19 declaration under Section 564(b)(1) of the Act, 21 U.S.C. section 360bbb-3(b)(1), unless the authorization  is terminated or revoked.  Performed at [facility], [address], [place], [place] [address]   Culture, blood (Routine X 2) w Reflex to ID Panel     Status: None   Collection Time: 05/31/23  8:30 AM   Specimen: BLOOD RIGHT WRIST  Result Value Ref Range Status   Specimen Description BLOOD RIGHT WRIST  Final   Special Requests   Final    BOTTLES DRAWN AEROBIC AND ANAEROBIC Blood Culture adequate volume   Culture   Final    NO GROWTH 5 DAYS Performed at [facility], [address] [place], [place] [address]    Report Status 06/05/2023 FINAL  Final     Discharge Instructions:   Discharge Instructions     (HEART FAILURE PATIENTS) Call MD:  Anytime you have any of the following symptoms: 1) 3 pound weight gain in 24 hours or 5 pounds in 1 week 2) shortness of breath, with or without a dry hacking cough 3) swelling in the hands, feet or stomach 4) if you have to sleep on extra pillows at night in order to breathe.   Complete by: As directed    Diet - low sodium heart healthy   Complete by: As directed    Discharge instructions   Complete by: As directed    Home health   Heart Failure patients record your daily weight using the same scale at the same time of day   Complete by: As directed    Increase activity slowly   Complete by: As directed       Allergies as of 06/08/2023       Reactions   Heparin Hives   Patient attests severe allergy- rash all over, severe itching, unable to take heparin Can take lovenox   Other Itching, Rash   "Sheets and Linens used by [name]" Mainly while getting the heparin        Medication List     STOP taking these medications    losartan 50 MG tablet Commonly known as: COZAAR   potassium chloride 10 MEQ tablet Commonly known as: KLOR-CON       TAKE these medications    acetaminophen 500  MG tablet Commonly known as: TYLENOL Take 2 tablets (1,000 mg total) by mouth 3 (three) times daily. What changed:  when to take this reasons to take this   CALCIUM 600 + D PO Take 1 tablet by mouth 2 (two) times daily.   Entresto 24-26 MG Generic drug: sacubitril-valsartan Take 1 tablet by mouth 2 (two) times daily.   FLUoxetine 20 MG capsule Commonly known as: PROZAC Take 20 mg by mouth daily as needed (for anxiety).   furosemide 40 MG tablet Commonly known as: LASIX Take 1 tablet (40 mg total) by mouth daily as needed for fluid. What changed:  when to take this reasons to take this   metoprolol tartrate 25 MG tablet Commonly known as: LOPRESSOR Take 1 tablet (25 mg total) by mouth 2 (two) times daily.   spironolactone 25 MG tablet Commonly known as: ALDACTONE Take 0.5 tablets (12.5 mg total) by mouth daily. Start taking on: June 09, 2023   Vitamin D 50 MCG (2000 UT) tablet Take 2,000 Units by mouth daily.   warfarin 3 MG tablet Commonly known as: COUMADIN Take 1-1.5 tablets (3-4.5 mg total) by mouth See admin instructions. Take 1 tablet by mouth every Mon, Wed, Fri, Sun and take 1 and 1/2 tablets all other days  OR AS DIRECTED BY COUMADIN CLINIC; NEEDS INR CHECK CALL OFFICE        Follow-up Information     [facility] Follow up.   Specialty: Home Health Services Why: Physical and Occupational therapy. Office will call to arrange follow up after hospital discharge. Contact information: [address] [place] [address] [phone]         Marden Noble, MD Follow up in 1 week(s).   Specialty: Internal Medicine Contact information: [address] [place] [address] [phone]                  Time coordinating discharge: 45 min  Signed:  Joseph Art DO  [facility] 06/08/2023, 12:14 PM

## 2023-06-08 NOTE — Progress Notes (Signed)
 Discharge instructions (including medications) discussed with and copy provided to patient/caregiver  Patient is incontient so may not go to the d/c lounge her ride will be here at 3pm.

## 2023-06-08 NOTE — Progress Notes (Addendum)
 PHARMACY - ANTICOAGULATION CONSULT NOTE  Pharmacy Consult for warfarin Indication: atrial fibrillation and hx DVT/PE  Allergies  Allergen Reactions   Heparin Hives    Patient attests severe allergy- rash all over, severe itching, unable to take heparin  Can take lovenox   Other Itching and Rash    "Sheets and Linens used by [name]" Mainly while getting the heparin    Patient Measurements: Height: 5\' 5"  (165.1 cm) Weight: 116.3 kg (256 lb 6.3 oz) IBW/kg (Calculated) : 57   Vital Signs: Temp: 97.3 F (36.3 C) (03/13 0814) Temp Source: Oral (03/13 0814) BP: 128/78 (03/13 0900) Pulse Rate: 61 (03/13 0900)  Labs: Recent Labs    06/06/23 0442 06/07/23 0436 06/07/23 1448 06/08/23 0309  HGB 15.6* 15.1* 16.3*  16.3* 14.9  HCT 48.5* 45.3 48.0*  48.0* 45.2  PLT 194 201  --  209  LABPROT 39.3* 31.8*  --  27.3*  INR 4.0* 3.1*  --  2.5*  CREATININE 1.47* 1.28*  --  1.26*    Estimated Creatinine Clearance: 47.6 mL/min (A) (by C-G formula based on SCr of 1.26 mg/dL (H)).   Medical History: Past Medical History:  Diagnosis Date   (HFpEF) heart failure with preserved ejection fraction (HCC)    a. 06/2008 Echo: EF 55-60%, mild LVH, triv AI, mild to mod MS, mod to sev dil LA, mildly dil RA.   Acute right MCA stroke (HCC)    a. 06/04/2010 Embolic stroke treated with TPA and thrombectomy with hemorrhagic transformation; Carotids 3/12 negative for ICA stenosis.   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 08/2005 - s/p IVC filter.   CHF (congestive heart failure) (HCC)    Depression    Embolus of femoral artery (HCC)    a. 06/2008 s/p bilateral embolectomies.   History of DVT (deep vein thrombosis)    a. s/p IVC filter.   HIT (heparin-induced thrombocytopenia) (HCC)    HTN (hypertension)    Lupus anticoagulant disorder (HCC)    Mitral stenosis    moderate-severe 09/04/20 echo   Obesity, unspecified    Permanent  atrial fibrillation (HCC)    a. On chronic Coumadin - INRs followed by PCP; b. CHA2DS2VASc = 5.   Popliteal artery embolism, right (HCC)    a. 01/2017 in the setting of subRx INR-->s/p embolectomy.    Assessment: 41 yoF with PHM of atrial fibrillaiton and PE on warfarin PTA (admit INR 2.3). Pharmacy consulted to manage warfarin.   -INR 3.1> 2.5 (warfarin held 3/11 and 3/12 due to INR > 4.0; warfarin dose not given 3/12  Home warfarin: 3mg  Mon, Wed, Fri, Sun and 4.5mg  all other days   Goal of Therapy:  Heparin level 0.3-0.7 units/ml Monitor platelets by anticoagulation protocol: Yes   Plan:  -Warfarin 3mg  po today  -Daily PT/INR  Harland German, PharmD Clinical Pharmacist **Pharmacist phone directory can now be found on [email or web address] ([email or web address]).  Listed under [facility].

## 2023-06-09 ENCOUNTER — Ambulatory Visit: Payer: No Typology Code available for payment source | Admitting: Cardiology

## 2023-06-09 LAB — LIPOPROTEIN A (LPA): Lipoprotein (a): 35.8 nmol/L — ABNORMAL HIGH (ref <75.0)

## 2023-06-12 DIAGNOSIS — F32A Depression, unspecified: Secondary | ICD-10-CM | POA: Diagnosis not present

## 2023-06-12 DIAGNOSIS — D75829 Heparin-induced thrombocytopenia, unspecified: Secondary | ICD-10-CM | POA: Diagnosis not present

## 2023-06-12 DIAGNOSIS — R7303 Prediabetes: Secondary | ICD-10-CM | POA: Diagnosis not present

## 2023-06-12 DIAGNOSIS — I5022 Chronic systolic (congestive) heart failure: Secondary | ICD-10-CM | POA: Diagnosis not present

## 2023-06-12 DIAGNOSIS — E876 Hypokalemia: Secondary | ICD-10-CM | POA: Diagnosis not present

## 2023-06-12 DIAGNOSIS — D6862 Lupus anticoagulant syndrome: Secondary | ICD-10-CM | POA: Diagnosis not present

## 2023-06-12 DIAGNOSIS — I5033 Acute on chronic diastolic (congestive) heart failure: Secondary | ICD-10-CM | POA: Diagnosis not present

## 2023-06-12 DIAGNOSIS — Z86711 Personal history of pulmonary embolism: Secondary | ICD-10-CM | POA: Diagnosis not present

## 2023-06-12 DIAGNOSIS — J9692 Respiratory failure, unspecified with hypercapnia: Secondary | ICD-10-CM | POA: Diagnosis not present

## 2023-06-12 DIAGNOSIS — Z8673 Personal history of transient ischemic attack (TIA), and cerebral infarction without residual deficits: Secondary | ICD-10-CM | POA: Diagnosis not present

## 2023-06-12 DIAGNOSIS — Z6841 Body Mass Index (BMI) 40.0 and over, adult: Secondary | ICD-10-CM | POA: Diagnosis not present

## 2023-06-12 DIAGNOSIS — J9601 Acute respiratory failure with hypoxia: Secondary | ICD-10-CM | POA: Diagnosis not present

## 2023-06-12 DIAGNOSIS — I4821 Permanent atrial fibrillation: Secondary | ICD-10-CM | POA: Diagnosis not present

## 2023-06-12 DIAGNOSIS — Z86718 Personal history of other venous thrombosis and embolism: Secondary | ICD-10-CM | POA: Diagnosis not present

## 2023-06-12 DIAGNOSIS — I080 Rheumatic disorders of both mitral and aortic valves: Secondary | ICD-10-CM | POA: Diagnosis not present

## 2023-06-12 DIAGNOSIS — N1831 Chronic kidney disease, stage 3a: Secondary | ICD-10-CM | POA: Diagnosis not present

## 2023-06-12 DIAGNOSIS — I130 Hypertensive heart and chronic kidney disease with heart failure and stage 1 through stage 4 chronic kidney disease, or unspecified chronic kidney disease: Secondary | ICD-10-CM | POA: Diagnosis not present

## 2023-06-12 DIAGNOSIS — Z9181 History of falling: Secondary | ICD-10-CM | POA: Diagnosis not present

## 2023-06-12 DIAGNOSIS — E66813 Obesity, class 3: Secondary | ICD-10-CM | POA: Diagnosis not present

## 2023-06-12 DIAGNOSIS — Z7901 Long term (current) use of anticoagulants: Secondary | ICD-10-CM | POA: Diagnosis not present

## 2023-06-12 NOTE — Progress Notes (Unsigned)
 Cardiology Office Note:    Date:  06/21/2023   ID:  [name], DOB 08/20/1945, MRN [id number]  PCP:  Melody Aspen, MD   [facility] Providers Cardiologist:  Melody Rotunda, MD Cardiology APP:  Melody Duster, PA     Referring MD: No ref. provider found   Chief Complaint  Patient presents with   Follow-up   Hospitalization Follow-up    HFpEF    History of Present Illness:    [name] is a 78 y.o. female with a hx of chronic systolic heart failure, hypertension, atrial fibrillation on Coumadin, mitral stenosis, history of CVA, history of DVT/PE, lupus anticoagulant syndrome, depression.   She was recently hospitalized 05/31/2023 with shortness of breath found to be in respiratory distress requiring CPAP.  She was also in A-fib with RVR with heart rates in the 130s.  She was diuresed.  Echocardiogram 05/31/2023 showed LVEF 35%, mild LVH, normal RV size and function, severely dilated left atria, moderate RAE, rheumatic mitral valve with trivial MR, mild to severe mitral stenosis.  She proceeded to right heart catheterization 06/07/2023 which showed pressures consistent with mild pulmonary arterial hypertension possibly due to vascular remodeling from longstanding mitral valve disease.  She had low cardiac output in the setting of normal EF/HFpEF.   She was discharged on as needed Lasix and no oxygen.  She is continued on metoprolol 25 mg twice daily, Entresto 24-26 mg twice daily, 12.5 mg spironolactone daily.  She is known to be in permanent atrial fibrillation and was rate controlled with metoprolol.  She was continued on Coumadin therapy. Troponin elevation at 1 35-1 25 not consistent with ACS and likely related to demand ischemia in the setting of hypoxic respiratory failure and RVR.  She presents today for cardiology follow-up.  She feels much better, she is taking 40 mg lasix daily instead of PRN. She is still losing 1 lb every 1-2 weeks but is also  better (less sweets). Overall she feels very well.    Past Medical History:  Diagnosis Date   (HFpEF) heart failure with preserved ejection fraction (HCC)    a. 06/2008 Echo: EF 55-60%, mild LVH, triv AI, mild to mod MS, mod to sev dil LA, mildly dil RA.   Acute right MCA stroke (HCC)    a. 06/04/2010 Embolic stroke treated with TPA and thrombectomy with hemorrhagic transformation; Carotids 3/12 negative for ICA stenosis.   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 08/2005 - s/p IVC filter.   CHF (congestive heart failure) (HCC)    Depression    Embolus of femoral artery (HCC)    a. 06/2008 s/p bilateral embolectomies.   History of DVT (deep vein thrombosis)    a. s/p IVC filter.   HIT (heparin-induced thrombocytopenia) (HCC)    HTN (hypertension)    Lupus anticoagulant disorder (HCC)    Mitral stenosis    moderate-severe 09/04/20 echo   Obesity, unspecified    Permanent atrial fibrillation (HCC)    a. On chronic Coumadin - INRs followed by PCP; b. CHA2DS2VASc = 5.   Popliteal artery embolism, right (HCC)    a. 01/2017 in the setting of subRx INR-->s/p embolectomy.    Past Surgical History:  Procedure Laterality Date   APPLICATION OF WOUND VAC  12/09/2020   Procedure: APPLICATION OF WOUND VAC;  Surgeon: Nadara Mustard, MD;  Location: [facility] OR;  Service:  Orthopedics;;   distal radius fracture. (12,/16/2010)     EMBOLECTOMY Right 02/15/2017   Procedure: EMBOLECTOMY RIGHT LEG;  Surgeon: Sherren Kerns, MD;  Location: [facility] OR;  Service: Vascular;  Laterality: Right;   EYE SURGERY Bilateral 2021   cataracts removed   I & D EXTREMITY Right 03/05/2017   Procedure: IRRIGATION AND DEBRIDEMENT RIGHT LEG HEMATOMA EVACUATION;  Surgeon: Sherren Kerns, MD;  Location: [facility] OR;  Service: Vascular;  Laterality: Right;   IVC Placement 08/30/2005     LIPOMA EXCISION Left 12/09/2020   Procedure: EXCISION LIPOMA LEFT THIGH;  Surgeon: Nadara Mustard, MD;  Location: [facility] OR;  Service: Orthopedics;  Laterality: Left;   Open reduction and internal fixation of left intra-articular     RIGHT HEART CATH N/A 06/07/2023   Procedure: RIGHT HEART CATH;  Surgeon: Laurey Morale, MD;  Location: [facility] INVASIVE CV LAB;  Service: Cardiovascular;  Laterality: N/A;    Current Medications: Current Meds  Medication Sig   acetaminophen (TYLENOL) 500 MG tablet Take 2 tablets (1,000 mg total) by mouth 3 (three) times daily. (Patient taking differently: Take 1,000 mg by mouth every 8 (eight) hours as needed for mild pain (pain score 1-3).)   Calcium Carb-Cholecalciferol (CALCIUM 600 + D PO) Take 1 tablet by mouth 2 (two) times daily.   Cholecalciferol (VITAMIN D) 50 MCG (2000 UT) tablet Take 2,000 Units by mouth daily.   FLUoxetine (PROZAC) 20 MG capsule Take 20 mg by mouth daily as needed (for anxiety).   furosemide (LASIX) 40 MG tablet Take 1 tablet (40 mg total) by mouth daily as needed for fluid.   metoprolol tartrate (LOPRESSOR) 25 MG tablet Take 1 tablet (25 mg total) by mouth 2 (two) times daily.   sacubitril-valsartan (ENTRESTO) 24-26 MG Take 1 tablet by mouth 2 (two) times daily.   spironolactone (ALDACTONE) 25 MG tablet Take 0.5 tablets (12.5 mg total) by mouth daily.   warfarin (COUMADIN) 3 MG tablet Take 1-1.5 tablets (3-4.5 mg total) by mouth See admin instructions. Take 1 tablet by mouth every Mon, Wed, Fri, Sun and take 1 and 1/2 tablets all other days OR AS DIRECTED BY COUMADIN CLINIC; NEEDS INR CHECK CALL OFFICE     Allergies:   Heparin and Other   Social History   Socioeconomic History   Marital status: Single    Spouse name: Not on file   Number of children: Not on file   Years of education: Not on file   Highest education level: Not on file  Occupational History   Not on file  Tobacco Use   Smoking status: Never   Smokeless tobacco: Never  Vaping Use   Vaping status: Never Used  Substance and Sexual Activity   Alcohol use: No    Drug use: No   Sexual activity: Not Currently  Other Topics Concern   Not on file  Social History Narrative    Lives alone.  She works at a Museum/gallery curator.   Daughter in area to assist if needed.  One-level home, 5-6 steps to   entry.  Functional history prior to admission was independent,   functional status upon admission to rehab services was +2, total assist   bed mobility,+2 total assist transfers, ambulation not tested, moderate   assist upper body, total assist lower body, activities of daily living.       Social Drivers of Corporate investment banker Strain: Not on file  Food Insecurity: No Food Insecurity (06/01/2023)  Hunger Vital Sign    Worried About Running Out of Food in the Last Year: Never true    Ran Out of Food in the Last Year: Never true  Transportation Needs: No Transportation Needs (06/01/2023)   PRAPARE - Administrator, Civil Service (Medical): No    Lack of Transportation (Non-Medical): No  Physical Activity: Not on file  Stress: Not on file  Social Connections: Moderately Isolated (06/01/2023)   Social Connection and Isolation Panel [NHANES]    Frequency of Communication with Friends and Family: Twice a week    Frequency of Social Gatherings with Friends and Family: Twice a week    Attends Religious Services: 1 to 4 times per year    Active Member of [employer] or Organizations: No    Attends Banker Meetings: Never    Marital Status: Never married     Family History: The patient's family history includes Coronary artery disease in an other family member; Diabetes in an other family member.  ROS:   Please see the history of present illness.     All other systems reviewed and are negative.  EKGs/Labs/Other Studies Reviewed:    The following studies were reviewed today:  EKG Interpretation Date/Time:  Wednesday June 21 2023 08:28:00 EDT Ventricular Rate:  84 PR Interval:    QRS Duration:  82 QT Interval:  376 QTC  Calculation: 444 R Axis:   -78  Text Interpretation: Atrial fibrillation Left axis deviation Anterior infarct , age undetermined When compared with ECG of 31-May-2023 00:19, PREVIOUS ECG IS PRESENT Confirmed by Micah Flesher ([id number]) on 06/21/2023 8:40:26 AM    Recent Labs: 05/31/2023: ALT 19; TSH 1.045 06/08/2023: B Natriuretic Peptide 144.9; BUN 46; Creatinine, Ser 1.26; Hemoglobin 14.9; Magnesium 2.4; Platelets 209; Potassium 4.2; Sodium 134  Recent Lipid Panel    Component Value Date/Time   CHOL  06/04/2010 0359    139        ATP III CLASSIFICATION:  <200     mg/dL   Desirable  604-540  mg/dL   Borderline High  >=981    mg/dL   High          TRIG 71 06/04/2010 0359   HDL 33 (L) 06/04/2010 0359   CHOLHDL 4.2 06/04/2010 0359   VLDL 14 06/04/2010 0359   LDLCALC  06/04/2010 0359    92        Total Cholesterol/HDL:CHD Risk Coronary Heart Disease Risk Table                     Men   Women  1/2 Average Risk   3.4   3.3  Average Risk       5.0   4.4  2 X Average Risk   9.6   7.1  3 X Average Risk  23.4   11.0        Use the calculated Patient Ratio above and the CHD Risk Table to determine the patient's CHD Risk.        ATP III CLASSIFICATION (LDL):  <100     mg/dL   Optimal  191-478  mg/dL   Near or Above                    Optimal  130-159  mg/dL   Borderline  295-621  mg/dL   High  >308     mg/dL   Very High     Risk Assessment/Calculations:    CHA2DS2-VASc  Score = 7   This indicates a 11.2% annual risk of stroke. The patient's score is based upon: CHF History: 1 HTN History: 1 Diabetes History: 0 Stroke History: 2 Vascular Disease History: 0 Age Score: 2 Gender Score: 1             Physical Exam:    VS:  BP 113/68   Pulse 84   Ht 5\' 5"  (1.651 m)   SpO2 100%   BMI 42.67 kg/m     Wt Readings from Last 3 Encounters:  05/31/23 256 lb 6.3 oz (116.3 kg)  06/03/22 256 lb 6.3 oz (116.3 kg)  02/20/22 267 lb (121.1 kg)     GEN:  Well nourished, well  developed in no acute distress HEENT: Normal NECK: No JVD; No carotid bruits LYMPHATICS: No lymphadenopathy CARDIAC: irregular rhythm, regular rate RESPIRATORY:  Clear to auscultation without rales, wheezing or rhonchi  ABDOMEN: Soft, non-tender, non-distended MUSCULOSKELETAL:  No edema; No deformity  SKIN: Warm and dry NEUROLOGIC:  Alert and oriented x 3 PSYCHIATRIC:  Normal affect   ASSESSMENT:    1. Permanent atrial fibrillation (HCC)   2. Medication management   3. NICM (nonischemic cardiomyopathy) (HCC)   4. Chronic diastolic heart failure (HCC)   5. Essential hypertension   6. Primary hypertension   7. Mitral valve stenosis, unspecified etiology   8. CKD stage 3a, GFR 45-59 ml/min (HCC)   9. Lupus anticoagulant syndrome (HCC)   10. Long term (current) use of anticoagulants    PLAN:    In order of problems listed above:  Chronic diastolic heart failure Moderate to severe mitral stenosis CKD stage IIIa Hypertension - Echocardiogram with preserved LVEF, RHC  -Not felt to be a surgical candidate - Continued on metoprolol, 24-26 mg Entresto twice daily, 12.5 mg spironolactone daily -Collect BMP today for new Entresto start   Permanent atrial fibrillation On chronic Coumadin therapy - Rate controlled on 25 mg Lopressor twice daily-continue this - Continue Coumadin dosing per pharmacy   History of lupus anticoagulant disorder History of DVT and PE - Continue warfarin   Follow up in 3-4 months.       Medication Adjustments/Labs and Tests Ordered: Current medicines are reviewed at length with the patient today.  Concerns regarding medicines are outlined above.  Orders Placed This Encounter  Procedures   Basic metabolic panel   EKG 12-Lead   No orders of the defined types were placed in this encounter.   Patient Instructions  Medication Instructions:  Your physician recommends that you continue on your current medications as directed. Please refer to the  Current Medication list given to you today.  *If you need a refill on your cardiac medications before your next appointment, please call your pharmacy*   Lab Work: BMET today  Testing/Procedures: NONE ordered at this time of appointment   Follow-Up: At [facility], you and your health needs are our priority.  As part of our continuing mission to provide you with exceptional heart care, we have created designated Provider Care Teams.  These Care Teams include your primary Cardiologist (physician) and Advanced Practice Providers (APPs -  Physician Assistants and Nurse Practitioners) who all work together to provide you with the care you need, when you need it.  We recommend signing up for the patient portal called "MyChart".  Sign up information is provided on this After Visit Summary.  MyChart is used to connect with patients for Virtual Visits (Telemedicine).  Patients are able to view  lab/test results, encounter notes, upcoming appointments, etc.  Non-urgent messages can be sent to your provider as well.   To learn more about what you can do with MyChart, go to [email or web address].    Your next appointment:   3-4 month(s)  Provider:   Rollene Rotunda, MD  or Micah Flesher, PA-C        Other Instructions   1st Floor: - Lobby - Registration  - Pharmacy  - Lab - Cafe  2nd Floor: - PV Lab - Diagnostic Testing (echo, CT, nuclear med)  3rd Floor: - Vacant  4th Floor: - TCTS (cardiothoracic surgery) - AFib Clinic - Structural Heart Clinic - Vascular Surgery  - Vascular Ultrasound  5th Floor: - HeartCare Cardiology (general and EP) - Clinical Pharmacy for coumadin, hypertension, lipid, weight-loss medications, and med management appointments    Valet parking services will be available as well.         Signed, [name], [place]  06/21/2023 8:59 AM    [facility]

## 2023-06-21 ENCOUNTER — Encounter: Payer: Self-pay | Admitting: Physician Assistant

## 2023-06-21 ENCOUNTER — Ambulatory Visit: Payer: Self-pay | Attending: Physician Assistant | Admitting: Physician Assistant

## 2023-06-21 VITALS — BP 113/68 | HR 84 | Ht 65.0 in

## 2023-06-21 DIAGNOSIS — I5032 Chronic diastolic (congestive) heart failure: Secondary | ICD-10-CM

## 2023-06-21 DIAGNOSIS — I10 Essential (primary) hypertension: Secondary | ICD-10-CM | POA: Diagnosis not present

## 2023-06-21 DIAGNOSIS — I4821 Permanent atrial fibrillation: Secondary | ICD-10-CM

## 2023-06-21 DIAGNOSIS — Z7901 Long term (current) use of anticoagulants: Secondary | ICD-10-CM

## 2023-06-21 DIAGNOSIS — Z79899 Other long term (current) drug therapy: Secondary | ICD-10-CM

## 2023-06-21 DIAGNOSIS — D6862 Lupus anticoagulant syndrome: Secondary | ICD-10-CM | POA: Diagnosis not present

## 2023-06-21 DIAGNOSIS — N1831 Chronic kidney disease, stage 3a: Secondary | ICD-10-CM

## 2023-06-21 DIAGNOSIS — I428 Other cardiomyopathies: Secondary | ICD-10-CM

## 2023-06-21 DIAGNOSIS — I050 Rheumatic mitral stenosis: Secondary | ICD-10-CM

## 2023-06-21 LAB — BASIC METABOLIC PANEL
BUN/Creatinine Ratio: 26 (ref 12–28)
BUN: 36 mg/dL — ABNORMAL HIGH (ref 8–27)
CO2: 21 mmol/L (ref 20–29)
Calcium: 9.6 mg/dL (ref 8.7–10.3)
Chloride: 101 mmol/L (ref 96–106)
Creatinine, Ser: 1.41 mg/dL — ABNORMAL HIGH (ref 0.57–1.00)
Glucose: 115 mg/dL — ABNORMAL HIGH (ref 70–99)
Potassium: 5.7 mmol/L — ABNORMAL HIGH (ref 3.5–5.2)
Sodium: 137 mmol/L (ref 134–144)
eGFR: 38 mL/min/{1.73_m2} — ABNORMAL LOW (ref >59)

## 2023-06-21 MED ORDER — FUROSEMIDE 40 MG PO TABS: 40.0000 mg | ORAL_TABLET | Freq: Every day | ORAL | 3 refills | Status: DC | PRN

## 2023-06-21 NOTE — Patient Instructions (Signed)
 Medication Instructions:  Your physician recommends that you continue on your current medications as directed. Please refer to the Current Medication list given to you today.  *If you need a refill on your cardiac medications before your next appointment, please call your pharmacy*   Lab Work: BMET today  Testing/Procedures: NONE ordered at this time of appointment   Follow-Up: At [facility], you and your health needs are our priority.  As part of our continuing mission to provide you with exceptional heart care, we have created designated Provider Care Teams.  These Care Teams include your primary Cardiologist (physician) and Advanced Practice Providers (APPs -  Physician Assistants and Nurse Practitioners) who all work together to provide you with the care you need, when you need it.  We recommend signing up for the patient portal called "MyChart".  Sign up information is provided on this After Visit Summary.  MyChart is used to connect with patients for Virtual Visits (Telemedicine).  Patients are able to view lab/test results, encounter notes, upcoming appointments, etc.  Non-urgent messages can be sent to your provider as well.   To learn more about what you can do with MyChart, go to [email or web address].    Your next appointment:   3-4 month(s)  Provider:   Rollene Rotunda, MD  or Micah Flesher, PA-C        Other Instructions   1st Floor: - Lobby - Registration  - Pharmacy  - Lab - Cafe  2nd Floor: - PV Lab - Diagnostic Testing (echo, CT, nuclear med)  3rd Floor: - Vacant  4th Floor: - TCTS (cardiothoracic surgery) - AFib Clinic - Structural Heart Clinic - Vascular Surgery  - Vascular Ultrasound  5th Floor: - HeartCare Cardiology (general and EP) - Clinical Pharmacy for coumadin, hypertension, lipid, weight-loss medications, and med management appointments    Valet parking services will be available as well.

## 2023-06-21 NOTE — Addendum Note (Signed)
 Addended by: Lamar Benes on: 06/21/2023 09:03 AM   Modules accepted: Orders

## 2023-06-22 ENCOUNTER — Other Ambulatory Visit: Payer: Self-pay | Admitting: Physician Assistant

## 2023-06-22 ENCOUNTER — Other Ambulatory Visit: Payer: Self-pay

## 2023-06-22 DIAGNOSIS — E875 Hyperkalemia: Secondary | ICD-10-CM

## 2023-06-22 DIAGNOSIS — I428 Other cardiomyopathies: Secondary | ICD-10-CM

## 2023-06-22 NOTE — Progress Notes (Signed)
 Stat BMP on 3/31

## 2023-06-28 ENCOUNTER — Encounter: Payer: Self-pay | Admitting: Nurse Practitioner

## 2023-06-28 ENCOUNTER — Ambulatory Visit: Attending: Nurse Practitioner | Admitting: Nurse Practitioner

## 2023-06-28 VITALS — BP 128/84 | HR 88 | Ht 65.0 in | Wt 225.0 lb

## 2023-06-28 DIAGNOSIS — I10 Essential (primary) hypertension: Secondary | ICD-10-CM | POA: Diagnosis not present

## 2023-06-28 DIAGNOSIS — D6862 Lupus anticoagulant syndrome: Secondary | ICD-10-CM | POA: Diagnosis not present

## 2023-06-28 DIAGNOSIS — I4821 Permanent atrial fibrillation: Secondary | ICD-10-CM

## 2023-06-28 DIAGNOSIS — Z86711 Personal history of pulmonary embolism: Secondary | ICD-10-CM | POA: Diagnosis not present

## 2023-06-28 DIAGNOSIS — I050 Rheumatic mitral stenosis: Secondary | ICD-10-CM

## 2023-06-28 DIAGNOSIS — E785 Hyperlipidemia, unspecified: Secondary | ICD-10-CM

## 2023-06-28 DIAGNOSIS — Z8673 Personal history of transient ischemic attack (TIA), and cerebral infarction without residual deficits: Secondary | ICD-10-CM

## 2023-06-28 DIAGNOSIS — N1831 Chronic kidney disease, stage 3a: Secondary | ICD-10-CM | POA: Diagnosis not present

## 2023-06-28 DIAGNOSIS — I5032 Chronic diastolic (congestive) heart failure: Secondary | ICD-10-CM | POA: Diagnosis not present

## 2023-06-28 LAB — BASIC METABOLIC PANEL WITH GFR
BUN/Creatinine Ratio: 22 (ref 12–28)
BUN: 28 mg/dL — ABNORMAL HIGH (ref 8–27)
CO2: 20 mmol/L (ref 20–29)
Calcium: 9 mg/dL (ref 8.7–10.3)
Chloride: 105 mmol/L (ref 96–106)
Creatinine, Ser: 1.29 mg/dL — ABNORMAL HIGH (ref 0.57–1.00)
Glucose: 105 mg/dL — ABNORMAL HIGH (ref 70–99)
Potassium: 4.4 mmol/L (ref 3.5–5.2)
Sodium: 139 mmol/L (ref 134–144)
eGFR: 43 mL/min/{1.73_m2} — ABNORMAL LOW (ref >59)

## 2023-06-28 MED ORDER — FUROSEMIDE 40 MG PO TABS: 40.0000 mg | ORAL_TABLET | Freq: Every day | ORAL | 3 refills | Status: AC | PRN

## 2023-06-28 NOTE — Patient Instructions (Addendum)
 Medication Instructions:  STOP TAKING SPIRONOLACTONE. CONTINUE ALL OTHER MEDICATION THERAPY.   Lab Work: BMET TO BE DONE TODAY.    Testing/Procedures: NONE  Follow-Up: At [facility], you and your health needs are our priority.  As part of our continuing mission to provide you with exceptional heart care, our providers are all part of one team.  This team includes your primary Cardiologist (physician) and Advanced Practice Providers or APPs (Physician Assistants and Nurse Practitioners) who all work together to provide you with the care you need, when you need it.  Your next appointment:   AS SCHEDULED IN JULY WITH DR. HOCHREIN.  Provider:   Rollene Rotunda, MD       Other Instructions:       1st Floor: - Lobby - Registration  - Pharmacy  - Lab - Cafe  2nd Floor: - PV Lab - Diagnostic Testing (echo, CT, nuclear med)  3rd Floor: - Vacant  4th Floor: - TCTS (cardiothoracic surgery) - AFib Clinic - Structural Heart Clinic - Vascular Surgery  - Vascular Ultrasound  5th Floor: - HeartCare Cardiology (general and EP) - Clinical Pharmacy for coumadin, hypertension, lipid, weight-loss medications, and med management appointments    Valet parking services will be available as well.

## 2023-06-28 NOTE — Progress Notes (Addendum)
 Office Visit    Patient Name: [name] Date of Encounter: 06/28/2023  Primary Care Provider:  Emilio Aspen, MD Primary Cardiologist:  Rollene Rotunda, MD  Chief Complaint    78 year old female with a history of heart failure with improved EF, permanent atrial fibrillation on chronic Coumadin therapy, mitral valve stenosis, hypertension, hyperlipidemia, history of CVA, history of DVT/PE, lupus anticoagulant syndrome, and CKD stage IIIa who presents for follow-up heart failure and atrial fibrillation.  Past Medical History    Past Medical History:  Diagnosis Date   (HFpEF) heart failure with preserved ejection fraction (HCC)    a. 06/2008 Echo: EF 55-60%, mild LVH, triv AI, mild to mod MS, mod to sev dil LA, mildly dil RA.   Acute right MCA stroke (HCC)    a. 06/04/2010 Embolic stroke treated with TPA and thrombectomy with hemorrhagic transformation; Carotids 3/12 negative for ICA stenosis.   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 08/2005 - s/p IVC filter.   CHF (congestive heart failure) (HCC)    Depression    Embolus of femoral artery (HCC)    a. 06/2008 s/p bilateral embolectomies.   History of DVT (deep vein thrombosis)    a. s/p IVC filter.   HIT (heparin-induced thrombocytopenia) (HCC)    HTN (hypertension)    Lupus anticoagulant disorder (HCC)    Mitral stenosis    moderate-severe 09/04/20 echo   Obesity, unspecified    Permanent atrial fibrillation (HCC)    a. On chronic Coumadin - INRs followed by PCP; b. CHA2DS2VASc = 5.   Popliteal artery embolism, right (HCC)    a. 01/2017 in the setting of subRx INR-->s/p embolectomy.   Past Surgical History:  Procedure Laterality Date   APPLICATION OF WOUND VAC  12/09/2020   Procedure: APPLICATION OF WOUND VAC;  Surgeon: Nadara Mustard, MD;  Location: [facility] OR;  Service: Orthopedics;;   distal radius fracture. (12,/16/2010)     EMBOLECTOMY Right 02/15/2017    Procedure: EMBOLECTOMY RIGHT LEG;  Surgeon: Sherren Kerns, MD;  Location: [facility] OR;  Service: Vascular;  Laterality: Right;   EYE SURGERY Bilateral 2021   cataracts removed   I & D EXTREMITY Right 03/05/2017   Procedure: IRRIGATION AND DEBRIDEMENT RIGHT LEG HEMATOMA EVACUATION;  Surgeon: Sherren Kerns, MD;  Location: [facility] OR;  Service: Vascular;  Laterality: Right;   IVC Placement 08/30/2005     LIPOMA EXCISION Left 12/09/2020   Procedure: EXCISION LIPOMA LEFT THIGH;  Surgeon: Nadara Mustard, MD;  Location: [facility] OR;  Service: Orthopedics;  Laterality: Left;   Open reduction and internal fixation of left intra-articular     RIGHT HEART CATH N/A 06/07/2023   Procedure: RIGHT HEART CATH;  Surgeon: Laurey Morale, MD;  Location: [facility] INVASIVE CV LAB;  Service: Cardiovascular;  Laterality: N/A;    Allergies  Allergies  Allergen Reactions   Heparin Hives    Patient attests severe allergy- rash all over, severe itching, unable to take heparin  Can take lovenox   Other Itching and Rash    "Sheets and Linens used by [name]" Mainly while getting the heparin     Labs/Other Studies Reviewed    The following studies were reviewed today:  Cardiac Studies & Procedures   ______________________________________________________________________________________________ CARDIAC CATHETERIZATION  CARDIAC CATHETERIZATION 06/07/2023  Narrative 1. Normal filling pressures. 2. Mild pulmonary arterial hypertension.  This may be due to  vascular remodeling from long-standing mitral valve disease. 3. Low cardiac output (in setting of normal EF/HFpEF).     ECHOCARDIOGRAM  ECHOCARDIOGRAM COMPLETE 05/31/2023  Narrative ECHOCARDIOGRAM REPORT    Patient Name:   [name] Date of Exam: 05/31/2023 Medical Rec #:  [id number]       Height:       65.0 in Accession #:    [id number]      Weight:       256.4 lb Date of Birth:  [birth year]       BSA:          2.198 m Patient Age:    77 years        BP:            153/95 mmHg Patient Gender: F               HR:           111 bpm. Exam Location:  Inpatient  Procedure: 2D Echo, Color Doppler, Cardiac Doppler and Intracardiac Opacification Agent (Both Spectral and Color Flow Doppler were utilized during procedure).  Indications:    I48.91* Unspecified atrial fibrillation  History:        Patient has prior history of Echocardiogram examinations. CHF, Arrythmias:Atrial Fibrillation; Risk Factors:Hypertension.  Sonographer:    Tiney Rouge RDCS Referring Phys: [id number] RONDELL A SMITH  IMPRESSIONS   1. Abnormal septal motion . Left ventricular ejection fraction, by estimation, is 55%. The left ventricle has normal function. The left ventricle has no regional wall motion abnormalities. There is mild left ventricular hypertrophy. Left ventricular diastolic parameters are indeterminate. 2. Right ventricular systolic function is normal. The right ventricular size is normal. 3. Left atrial size was severely dilated. 4. Right atrial size was moderately dilated. 5. Rheuamtic MV with mean gradient 14 peak 22.4 mmHg at HR 83 bpm and MVA 1.03 cm2. The mitral valve is rheumatic. Trivial mitral valve regurgitation. Moderate to severe mitral stenosis. 6. The aortic valve is tricuspid. There is moderate calcification of the aortic valve. There is moderate thickening of the aortic valve. Aortic valve regurgitation is mild. Aortic valve sclerosis is present, with no evidence of aortic valve stenosis. 7. The inferior vena cava is normal in size with greater than 50% respiratory variability, suggesting right atrial pressure of 3 mmHg.  FINDINGS Left Ventricle: Abnormal septal motion. Left ventricular ejection fraction, by estimation, is 55%. The left ventricle has normal function. The left ventricle has no regional wall motion abnormalities. Definity contrast agent was given IV to delineate the left ventricular endocardial borders. Strain was performed and the  global longitudinal strain is indeterminate. The left ventricular internal cavity size was normal in size. There is mild left ventricular hypertrophy. Left ventricular diastolic parameters are indeterminate.  Right Ventricle: The right ventricular size is normal. No increase in right ventricular wall thickness. Right ventricular systolic function is normal.  Left Atrium: Left atrial size was severely dilated.  Right Atrium: Right atrial size was moderately dilated.  Pericardium: There is no evidence of pericardial effusion.  Mitral Valve: Rheuamtic MV with mean gradient 14 peak 22.4 mmHg at HR 83 bpm and MVA 1.03 cm2. The mitral valve is rheumatic. Trivial mitral valve regurgitation. Moderate to severe mitral valve stenosis. MV peak gradient, 22.4 mmHg. The mean mitral valve gradient is 14.0 mmHg.  Tricuspid Valve: The tricuspid valve is normal in structure. Tricuspid valve regurgitation is mild . No evidence of tricuspid stenosis.  Aortic Valve: The aortic valve is tricuspid.  There is moderate calcification of the aortic valve. There is moderate thickening of the aortic valve. Aortic valve regurgitation is mild. Aortic valve sclerosis is present, with no evidence of aortic valve stenosis. Aortic valve mean gradient measures 9.0 mmHg. Aortic valve peak gradient measures 15.4 mmHg. Aortic valve area, by VTI measures 1.14 cm.  Pulmonic Valve: The pulmonic valve was normal in structure. Pulmonic valve regurgitation is not visualized. No evidence of pulmonic stenosis.  Aorta: The aortic root is normal in size and structure.  Venous: The inferior vena cava is normal in size with greater than 50% respiratory variability, suggesting right atrial pressure of 3 mmHg.  IAS/Shunts: No atrial level shunt detected by color flow Doppler.  Additional Comments: 3D was performed not requiring image post processing on an independent workstation and was indeterminate.   LEFT VENTRICLE PLAX 2D LVIDd:          4.10 cm   Diastology LVIDs:         3.20 cm   LV e' medial:   8.16 cm/s LV PW:         1.30 cm   LV E/e' medial: 27.0 LV IVS:        1.30 cm LVOT diam:     1.90 cm LV SV:         41 LV SV Index:   19 LVOT Area:     2.84 cm   IVC IVC diam: 2.10 cm  LEFT ATRIUM              Index         RIGHT ATRIUM           Index LA diam:        6.40 cm  2.91 cm/m    RA Area:     32.90 cm LA Vol (A2C):   258.0 ml 117.36 ml/m  RA Volume:   107.00 ml 48.67 ml/m LA Vol (A4C):   266.0 ml 121.00 ml/m LA Biplane Vol: 268.0 ml 121.91 ml/m AORTIC VALVE AV Area (Vmax):    1.27 cm AV Area (Vmean):   1.22 cm AV Area (VTI):     1.14 cm AV Vmax:           196.00 cm/s AV Vmean:          142.000 cm/s AV VTI:            0.363 m AV Peak Grad:      15.4 mmHg AV Mean Grad:      9.0 mmHg LVOT Vmax:         87.70 cm/s LVOT Vmean:        60.950 cm/s LVOT VTI:          0.146 m LVOT/AV VTI ratio: 0.40  AORTA Ao Root diam: 2.70 cm Ao Asc diam:  3.50 cm  MITRAL VALVE MV Area (PHT): 1.03 cm     SHUNTS MV Area VTI:   0.71 cm     Systemic VTI:  0.15 m MV Peak grad:  22.4 mmHg    Systemic Diam: 1.90 cm MV Mean grad:  14.0 mmHg MV Vmax:       2.37 m/s MV Vmean:      180.0 cm/s MV Decel Time: 733 msec MV E velocity: 220.00 cm/s  Charlton Haws MD Electronically signed by Charlton Haws MD Signature Date/Time: 05/31/2023/12:27:33 PM    Final          ______________________________________________________________________________________________     Recent Labs: 05/31/2023: ALT  19; TSH 1.045 06/08/2023: B Natriuretic Peptide 144.9; Hemoglobin 14.9; Magnesium 2.4; Platelets 209 06/21/2023: BUN 36; Creatinine, Ser 1.41; Potassium 5.7; Sodium 137  Recent Lipid Panel    Component Value Date/Time   CHOL  06/04/2010 0359    139        ATP III CLASSIFICATION:  <200     mg/dL   Desirable  409-811  mg/dL   Borderline High  >=914    mg/dL   High          TRIG 71 06/04/2010 0359   HDL 33 (L)  06/04/2010 0359   CHOLHDL 4.2 06/04/2010 0359   VLDL 14 06/04/2010 0359   LDLCALC  06/04/2010 0359    92        Total Cholesterol/HDL:CHD Risk Coronary Heart Disease Risk Table                     Men   Women  1/2 Average Risk   3.4   3.3  Average Risk       5.0   4.4  2 X Average Risk   9.6   7.1  3 X Average Risk  23.4   11.0        Use the calculated Patient Ratio above and the CHD Risk Table to determine the patient's CHD Risk.        ATP III CLASSIFICATION (LDL):  <100     mg/dL   Optimal  782-956  mg/dL   Near or Above                    Optimal  130-159  mg/dL   Borderline  213-086  mg/dL   High  >578     mg/dL   Very High    History of Present Illness    78 year old female with the above past medical history including heart failure with improved EF, permanent atrial fibrillation on chronic Coumadin therapy, mitral valve stenosis, hypertension, hyperlipidemia, history of CVA, history of DVT/PE, lupus anticoagulant syndrome, and CKD stage IIIa.  Prior echocardiogram in 2023 showed EF 45 to 50%, mild to moderate mitral valve regurgitation, moderate to severe mitral stenosis.  She was hospitalized in March 2025 in the setting of acute respiratory failure, atrial fibrillation with RVR.  Echocardiogram showed EF 55%, mild LVH, normal RV size and function, severely dilated left atria, rheumatic mitral valve with trivial MR, moderate to severe mitral stenosis.  Right heart catheterization on 06/07/2023 showed pressures consistent with mild pulmonary arterial hypertension possibly due to vascular remodeling from longstanding mitral valve disease.  She had low cardiac output in the setting of normal EF.  She was diuresed and discharged home in stable condition.  She was last seen in office on 06/21/2023 and was doing well.  Of note, she has not been felt to be a surgical candidate for her mitral valve disorder.  Labs revealed elevated potassium.  Spironolactone was discontinued, Sherryll Burger  was held.  She was given Lokelma x1.  She presents today for follow-up.  Since her last visit she has been stable from a cardiac standpoint.  She denies any symptoms concerning for angina, denies dyspnea, edema, PND, orthopnea, weight gain.  She has been taking her Lasix daily and is tolerating this well. Overall, she reports feeling well.   Home Medications    Current Outpatient Medications  Medication Sig Dispense Refill   acetaminophen (TYLENOL) 500 MG tablet Take 2 tablets (1,000 mg total) by mouth 3 (  three) times daily. (Patient taking differently: Take 1,000 mg by mouth every 8 (eight) hours as needed for mild pain (pain score 1-3).) 30 tablet 0   Calcium Carb-Cholecalciferol (CALCIUM 600 + D PO) Take 1 tablet by mouth 2 (two) times daily.     Cholecalciferol (VITAMIN D) 50 MCG (2000 UT) tablet Take 2,000 Units by mouth daily.     FLUoxetine (PROZAC) 20 MG capsule Take 20 mg by mouth daily as needed (for anxiety).     metoprolol tartrate (LOPRESSOR) 25 MG tablet Take 1 tablet (25 mg total) by mouth 2 (two) times daily. 60 tablet 2   sacubitril-valsartan (ENTRESTO) 24-26 MG Take 1 tablet by mouth 2 (two) times daily. 60 tablet 0   warfarin (COUMADIN) 3 MG tablet Take 1-1.5 tablets (3-4.5 mg total) by mouth See admin instructions. Take 1 tablet by mouth every Mon, Wed, Fri, Sun and take 1 and 1/2 tablets all other days OR AS DIRECTED BY COUMADIN CLINIC; NEEDS INR CHECK CALL OFFICE     furosemide (LASIX) 40 MG tablet Take 1 tablet (40 mg total) by mouth daily as needed for fluid. 90 tablet 3   No current facility-administered medications for this visit.     Review of Systems    She denies chest pain, palpitations, dyspnea, pnd, orthopnea, n, v, dizziness, syncope, edema, weight gain, or early satiety. All other systems reviewed and are otherwise negative except as noted above.   Physical Exam    VS:  BP 128/84 (BP Location: Left Arm, Patient Position: Sitting, Cuff Size: Large)   Pulse  88   Ht 5\' 5"  (1.651 m)   Wt 225 lb (102.1 kg)   BMI 37.44 kg/m  GEN: Well nourished, well developed, in no acute distress. HEENT: normal. Neck: Supple, no JVD, carotid bruits, or masses. Cardiac: IRIR, no murmurs, rubs, or gallops. No clubbing, cyanosis, edema.  Radials/DP/PT 2+ and equal bilaterally.  Respiratory:  Respirations regular and unlabored, clear to auscultation bilaterally. GI: Soft, nontender, nondistended, BS + x 4. MS: no deformity or atrophy. Skin: warm and dry, no rash. Neuro:  Strength and sensation are intact. Psych: Normal affect.  Accessory Clinical Findings    ECG personally reviewed by me today -    - no EKG in office today.   Lab Results  Component Value Date   WBC 7.7 06/08/2023   HGB 14.9 06/08/2023   HCT 45.2 06/08/2023   MCV 96.4 06/08/2023   PLT 209 06/08/2023   Lab Results  Component Value Date   CREATININE 1.41 (H) 06/21/2023   BUN 36 (H) 06/21/2023   NA 137 06/21/2023   K 5.7 (H) 06/21/2023   CL 101 06/21/2023   CO2 21 06/21/2023   Lab Results  Component Value Date   ALT 19 05/31/2023   AST 26 05/31/2023   ALKPHOS 100 05/31/2023   BILITOT 1.3 (H) 05/31/2023   Lab Results  Component Value Date   CHOL  06/04/2010    139        ATP III CLASSIFICATION:  <200     mg/dL   Desirable  621-308  mg/dL   Borderline High  >=657    mg/dL   High          HDL 33 (L) 06/04/2010   LDLCALC  06/04/2010    92        Total Cholesterol/HDL:CHD Risk Coronary Heart Disease Risk Table  Men   Women  1/2 Average Risk   3.4   3.3  Average Risk       5.0   4.4  2 X Average Risk   9.6   7.1  3 X Average Risk  23.4   11.0        Use the calculated Patient Ratio above and the CHD Risk Table to determine the patient's CHD Risk.        ATP III CLASSIFICATION (LDL):  <100     mg/dL   Optimal  161-096  mg/dL   Near or Above                    Optimal  130-159  mg/dL   Borderline  045-409  mg/dL   High  >811     mg/dL   Very  High   TRIG 71 06/04/2010   CHOLHDL 4.2 06/04/2010    Lab Results  Component Value Date   HGBA1C 5.8 (H) 05/31/2023    Assessment & Plan    1. Heart failure with improved EF: Prior echocardiogram in 2023 showed EF 45 to 50%, mild to moderate mitral valve regurgitation, moderate to severe mitral stenosis. Echocardiogram in 05/2023 showed EF 55%, mild LVH, normal RV size and function, severely dilated left atrium, rheumatic mitral valve with trivial MR, moderate to severe mitral stenosis.  Right heart catheterization on 06/07/2023 showed pressures consistent with mild pulmonary arterial hypertension possibly due to vascular remodeling from longstanding mitral valve disease.  Euvolemic and well compensated on exam. Spironolactone was discontinued recently in the setting of hyperkalemia, Sherryll Burger was held.  She received 1 dose of Lokelma.  Will update BMET today.  If potassium is stable, we will plan to retrial Entresto with repeat BMET in 2 weeks.  For now, will discontinue spironolactone.  2. Permanent atrial fibrillation: Rate controlled.  Continue metoprolol, warfarin.  3. Mitral valve stenosis: Moderate to severe on most recent echo, trivial MR.  Prior notes indicate she is not felt to be a surgical candidate.  Continue Lasix.  4. Hypertension: BP well controlled. Continue current antihypertensive regimen.   5. History of CVA: Continue warfarin.  6. History of DVT/PE/Lupus anticoagulant syndrome: Continue warfarin.  7 CKD stage IIIa: Creatinine was stable at 1.41 in 05/2023.  Repeat BMET pending as above.  8. Disposition:  Follow-up as scheduled with Dr. Antoine Poche in 09/2023.       Joylene Grapes, NP 06/28/2023, 12:24 PM

## 2023-07-03 ENCOUNTER — Telehealth: Payer: Self-pay

## 2023-07-03 ENCOUNTER — Other Ambulatory Visit: Payer: Self-pay

## 2023-07-03 DIAGNOSIS — E785 Hyperlipidemia, unspecified: Secondary | ICD-10-CM

## 2023-07-03 DIAGNOSIS — Z79899 Other long term (current) drug therapy: Secondary | ICD-10-CM

## 2023-07-03 NOTE — Telephone Encounter (Signed)
 BMET Lab order mailed to pt per pts request.

## 2023-07-26 ENCOUNTER — Telehealth: Payer: Self-pay | Admitting: Cardiology

## 2023-07-26 MED ORDER — SACUBITRIL-VALSARTAN 24-26 MG PO TABS: 1.0000 | ORAL_TABLET | Freq: Two times a day (BID) | ORAL | 3 refills | Status: DC

## 2023-07-26 NOTE — Telephone Encounter (Signed)
*  STAT* If patient is at the pharmacy, call can be transferred to refill team.   1. Which medications need to be refilled? (please list name of each medication and dose if known) Entresto    2. Would you like to learn more about the convenience, safety, & potential cost savings by using the [facility]?     3. Are you open to using the [facility] (Type [facility].    4. Which pharmacy/location (including street and city if local pharmacy) is medication to be sent to?[facility] [address] and [employer]  5. Do they need a 30 day or 90 day supply? 60 days #120 and refills - please call today- out of medicine

## 2023-07-26 NOTE — Telephone Encounter (Signed)
 Pt's medication was sent to pt's pharmacy as requested. Confirmation received.

## 2023-09-27 NOTE — Progress Notes (Unsigned)
 Cardiology Office Note:   Date:  09/28/2023  ID:  [name], DOB 12/17/1945, MRN [id number] PCP: Charlott Dorn LABOR, MD  [facility] Providers Cardiologist:  Lynwood Schilling, MD Cardiology APP:  Madie Jon Garre, PA {  History of Present Illness:   [name] is a 78 y.o. female who I have not seen since 2022.  She has heart failure with mildly reduced EF, atrial fibrillation on chronic anticoagulation, mitral stenosis, CVA, history of DVT/PE, HIT, lupus anticoagulant disorder, depression, and morbid obesity presents with vomiting and shortness of breath.   She was in the hospital in March with acute on chronic diastolic HF.  She had wlas treated with IV diuresis.  She had right heart cath.  I reviewed the records for this visit and she had presented that day with nausea and then shortness of breath.  I do not see what the cause of her nausea and vomiting was but she was managed for diastolic heart failure.  She was sent home on Entresto .  However, on follow-up visits in the office she subsequently had hyperkalemia and Entresto  and spironolactone  were discontinued.  She is actually felt well.  She denies any new shortness of breath.  She is limited by some hip pain.  She denies chest pressure, neck or arm discomfort.  She has no new shortness of breath, PND or orthopnea.  She has had no new palpitations and does not really notice her fibrillation.  She has had no weight gain or edema.  She is watching her salt and fluid.  She walks with a walker  ROS: As stated in the HPI and negative for all other systems.  Studies Reviewed:    EKG:     NA  Risk Assessment/Calculations:    CHA2DS2-VASc Score = 7   This indicates a 11.2% annual risk of stroke. The patient's score is based upon: CHF History: 1 HTN History: 1 Diabetes History: 0 Stroke History: 2 Vascular Disease History: 0 Age Score: 2 Gender Score: 1   Physical Exam:   VS:  BP (!) 189/89   Pulse 81   Ht  5' 5 (1.651 m)   Wt 233 lb (105.7 kg)   SpO2 93%   BMI 38.77 kg/m    Wt Readings from Last 3 Encounters:  09/28/23 233 lb (105.7 kg)  06/28/23 225 lb (102.1 kg)  05/31/23 256 lb 6.3 oz (116.3 kg)     GEN: Well nourished, well developed in no acute distress NECK: No JVD; No carotid bruits CARDIAC: Irregular RR, no murmurs, rubs, gallops RESPIRATORY:  Clear to auscultation without rales, wheezing or rhonchi , positive rub ABDOMEN: Soft, non-tender, non-distended EXTREMITIES:  No edema; No deformity   ASSESSMENT AND PLAN:   Chronic diastolic heart failure:    She seems to be euvolemic.  No change in therapy.   Chronic atrial fib: She tolerates anticoagulation and wants to stay on warfarin.  No change in therapy.   Moderate to severe mitral stenosis/ Mild MR:  This was moderate to severe on echo in March 2025.  I will follow this up again in 1 year with another echocardiogram.   Hypertension: Her blood pressure is not at target.  She had to be taken off Entresto  and spironolactone .  I am going to add amlodipine 5 mg daily and she is going to keep blood pressure diary.  We will have her follow-up in about 6 weeks.  CKD IIIa: She should have repeat blood work when she  comes back.  Creatinine was stable at the last check.     Follow up in about 4 to 6 weeks with Damien Braver NP  Signed, Lynwood Schilling, MD

## 2023-09-28 ENCOUNTER — Ambulatory Visit: Attending: Cardiology | Admitting: Cardiology

## 2023-09-28 ENCOUNTER — Encounter: Payer: Self-pay | Admitting: Cardiology

## 2023-09-28 VITALS — BP 189/89 | HR 81 | Ht 65.0 in | Wt 233.0 lb

## 2023-09-28 DIAGNOSIS — I050 Rheumatic mitral stenosis: Secondary | ICD-10-CM

## 2023-09-28 DIAGNOSIS — I5032 Chronic diastolic (congestive) heart failure: Secondary | ICD-10-CM

## 2023-09-28 DIAGNOSIS — I4821 Permanent atrial fibrillation: Secondary | ICD-10-CM | POA: Diagnosis not present

## 2023-09-28 MED ORDER — AMLODIPINE BESYLATE 5 MG PO TABS
5.0000 mg | ORAL_TABLET | Freq: Every day | ORAL | 3 refills | Status: AC
Start: 2023-09-28 — End: ?

## 2023-09-28 NOTE — Patient Instructions (Signed)
 Medication Instructions:  Start Amlodipine 5 mg once daily *If you need a refill on your cardiac medications before your next appointment, please call your pharmacy*  Lab Work: NONE If you have labs (blood work) drawn today and your tests are completely normal, you will receive your results only by: MyChart Message (if you have MyChart) OR A paper copy in the mail If you have any lab test that is abnormal or we need to change your treatment, we will call you to review the results.  Testing/Procedures: NONE  Follow-Up: At [facility], you and your health needs are our priority.  As part of our continuing mission to provide you with exceptional heart care, our providers are all part of one team.  This team includes your primary Cardiologist (physician) and Advanced Practice Providers or APPs (Physician Assistants and Nurse Practitioners) who all work together to provide you with the care you need, when you need it.  Your next appointment:   6 weeks to 1 month  Provider:   Damien Braver, NP  We recommend signing up for the patient portal called MyChart.  Sign up information is provided on this After Visit Summary.  MyChart is used to connect with patients for Virtual Visits (Telemedicine).  Patients are able to view lab/test results, encounter notes, upcoming appointments, etc.  Non-urgent messages can be sent to your provider as well.   To learn more about what you can do with MyChart, go to [email or web address].

## 2023-11-08 ENCOUNTER — Ambulatory Visit: Attending: Nurse Practitioner | Admitting: Nurse Practitioner

## 2023-11-08 ENCOUNTER — Encounter: Payer: Self-pay | Admitting: Nurse Practitioner

## 2023-11-08 VITALS — BP 126/72 | HR 91 | Ht 66.0 in | Wt 228.6 lb

## 2023-11-08 DIAGNOSIS — I10 Essential (primary) hypertension: Secondary | ICD-10-CM | POA: Diagnosis not present

## 2023-11-08 DIAGNOSIS — I5032 Chronic diastolic (congestive) heart failure: Secondary | ICD-10-CM

## 2023-11-08 DIAGNOSIS — I4821 Permanent atrial fibrillation: Secondary | ICD-10-CM | POA: Diagnosis not present

## 2023-11-08 DIAGNOSIS — Z8673 Personal history of transient ischemic attack (TIA), and cerebral infarction without residual deficits: Secondary | ICD-10-CM | POA: Diagnosis not present

## 2023-11-08 DIAGNOSIS — I5022 Chronic systolic (congestive) heart failure: Secondary | ICD-10-CM | POA: Diagnosis not present

## 2023-11-08 DIAGNOSIS — D6862 Lupus anticoagulant syndrome: Secondary | ICD-10-CM | POA: Diagnosis not present

## 2023-11-08 DIAGNOSIS — M810 Age-related osteoporosis without current pathological fracture: Secondary | ICD-10-CM | POA: Diagnosis not present

## 2023-11-08 DIAGNOSIS — Z7901 Long term (current) use of anticoagulants: Secondary | ICD-10-CM | POA: Diagnosis not present

## 2023-11-08 DIAGNOSIS — D6859 Other primary thrombophilia: Secondary | ICD-10-CM | POA: Diagnosis not present

## 2023-11-08 DIAGNOSIS — E785 Hyperlipidemia, unspecified: Secondary | ICD-10-CM | POA: Diagnosis not present

## 2023-11-08 DIAGNOSIS — N1831 Chronic kidney disease, stage 3a: Secondary | ICD-10-CM

## 2023-11-08 DIAGNOSIS — I4891 Unspecified atrial fibrillation: Secondary | ICD-10-CM | POA: Diagnosis not present

## 2023-11-08 DIAGNOSIS — Z86711 Personal history of pulmonary embolism: Secondary | ICD-10-CM

## 2023-11-08 DIAGNOSIS — I050 Rheumatic mitral stenosis: Secondary | ICD-10-CM | POA: Diagnosis not present

## 2023-11-08 DIAGNOSIS — Z79899 Other long term (current) drug therapy: Secondary | ICD-10-CM | POA: Diagnosis not present

## 2023-11-08 DIAGNOSIS — I739 Peripheral vascular disease, unspecified: Secondary | ICD-10-CM | POA: Diagnosis not present

## 2023-11-08 DIAGNOSIS — E559 Vitamin D deficiency, unspecified: Secondary | ICD-10-CM | POA: Diagnosis not present

## 2023-11-08 DIAGNOSIS — S3210XD Unspecified fracture of sacrum, subsequent encounter for fracture with routine healing: Secondary | ICD-10-CM | POA: Diagnosis not present

## 2023-11-08 NOTE — Progress Notes (Signed)
 Office Visit    Patient Name: [name] Date of Encounter: 11/08/2023  Primary Care Provider:  Charlott Dorn LABOR, MD Primary Cardiologist:  Lynwood Schilling, MD  Chief Complaint    78 year old female with a history of heart failure with improved EF, permanent atrial fibrillation on chronic Coumadin  therapy, mitral valve stenosis, hypertension, hyperlipidemia, history of CVA, history of DVT/PE, lupus anticoagulant syndrome, and CKD stage IIIa who presents for follow-up related to hypertension.   Past Medical History    Past Medical History:  Diagnosis Date   (HFpEF) heart failure with preserved ejection fraction (HCC)    a. 06/2008 Echo: EF 55-60%, mild LVH, triv AI, mild to mod MS, mod to sev dil LA, mildly dil RA.   Acute right MCA stroke (HCC)    a. 06/04/2010 Embolic stroke treated with TPA and thrombectomy with hemorrhagic transformation; Carotids 3/12 negative for ICA stenosis.   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 08/2005 - s/p IVC filter.   CHF (congestive heart failure) (HCC)    Depression    Embolus of femoral artery (HCC)    a. 06/2008 s/p bilateral embolectomies.   History of DVT (deep vein thrombosis)    a. s/p IVC filter.   HIT (heparin-induced thrombocytopenia) (HCC)    HTN (hypertension)    Lupus anticoagulant disorder (HCC)    Mitral stenosis    moderate-severe 09/04/20 echo   Obesity, unspecified    Permanent atrial fibrillation (HCC)    a. On chronic Coumadin  - INRs followed by PCP; b. CHA2DS2VASc = 5.   Popliteal artery embolism, right (HCC)    a. 01/2017 in the setting of subRx INR-->s/p embolectomy.   Past Surgical History:  Procedure Laterality Date   APPLICATION OF WOUND VAC  12/09/2020   Procedure: APPLICATION OF WOUND VAC;  Surgeon: Harden Jerona GAILS, MD;  Location: [facility] OR;  Service: Orthopedics;;   distal radius fracture. (12,/16/2010)     EMBOLECTOMY Right 02/15/2017   Procedure:  EMBOLECTOMY RIGHT LEG;  Surgeon: Harvey Carlin BRAVO, MD;  Location: [facility] OR;  Service: Vascular;  Laterality: Right;   EYE SURGERY Bilateral 2021   cataracts removed   I & D EXTREMITY Right 03/05/2017   Procedure: IRRIGATION AND DEBRIDEMENT RIGHT LEG HEMATOMA EVACUATION;  Surgeon: Harvey Carlin BRAVO, MD;  Location: [facility] OR;  Service: Vascular;  Laterality: Right;   IVC Placement 08/30/2005     LIPOMA EXCISION Left 12/09/2020   Procedure: EXCISION LIPOMA LEFT THIGH;  Surgeon: Harden Jerona GAILS, MD;  Location: [facility] OR;  Service: Orthopedics;  Laterality: Left;   Open reduction and internal fixation of left intra-articular     RIGHT HEART CATH N/A 06/07/2023   Procedure: RIGHT HEART CATH;  Surgeon: Rolan Ezra RAMAN, MD;  Location: [facility] INVASIVE CV LAB;  Service: Cardiovascular;  Laterality: N/A;    Allergies  Allergies  Allergen Reactions   Heparin Hives    Patient attests severe allergy- rash all over, severe itching, unable to take heparin  Can take lovenox    Other Itching and Rash    Sheets and Linens used by [employer] while getting the heparin     Labs/Other Studies Reviewed    The following studies were reviewed today:  Cardiac Studies & Procedures   ______________________________________________________________________________________________ CARDIAC CATHETERIZATION  CARDIAC CATHETERIZATION 06/07/2023  Conclusion 1. Normal filling pressures. 2. Mild pulmonary arterial hypertension.  This may be due to vascular  remodeling from long-standing mitral valve disease. 3. Low cardiac output (in setting of normal EF/HFpEF).     ECHOCARDIOGRAM  ECHOCARDIOGRAM COMPLETE 05/31/2023  Narrative ECHOCARDIOGRAM REPORT    Patient Name:   [name] Date of Exam: 05/31/2023 Medical Rec #:  [id number]       Height:       65.0 in Accession #:    [id number]      Weight:       256.4 lb Date of Birth:  01/26/1946       BSA:          2.198 m Patient Age:    77 years        BP:            153/95 mmHg Patient Gender: F               HR:           111 bpm. Exam Location:  Inpatient  Procedure: 2D Echo, Color Doppler, Cardiac Doppler and Intracardiac Opacification Agent (Both Spectral and Color Flow Doppler were utilized during procedure).  Indications:    I48.91* Unspecified atrial fibrillation  History:        Patient has prior history of Echocardiogram examinations. CHF, Arrythmias:Atrial Fibrillation; Risk Factors:Hypertension.  Sonographer:    Kenard Moats RDCS Referring Phys: [id number] RONDELL A SMITH  IMPRESSIONS   1. Abnormal septal motion . Left ventricular ejection fraction, by estimation, is 55%. The left ventricle has normal function. The left ventricle has no regional wall motion abnormalities. There is mild left ventricular hypertrophy. Left ventricular diastolic parameters are indeterminate. 2. Right ventricular systolic function is normal. The right ventricular size is normal. 3. Left atrial size was severely dilated. 4. Right atrial size was moderately dilated. 5. Rheuamtic MV with mean gradient 14 peak 22.4 mmHg at HR 83 bpm and MVA 1.03 cm2. The mitral valve is rheumatic. Trivial mitral valve regurgitation. Moderate to severe mitral stenosis. 6. The aortic valve is tricuspid. There is moderate calcification of the aortic valve. There is moderate thickening of the aortic valve. Aortic valve regurgitation is mild. Aortic valve sclerosis is present, with no evidence of aortic valve stenosis. 7. The inferior vena cava is normal in size with greater than 50% respiratory variability, suggesting right atrial pressure of 3 mmHg.  FINDINGS Left Ventricle: Abnormal septal motion. Left ventricular ejection fraction, by estimation, is 55%. The left ventricle has normal function. The left ventricle has no regional wall motion abnormalities. Definity  contrast agent was given IV to delineate the left ventricular endocardial borders. Strain was performed and the global  longitudinal strain is indeterminate. The left ventricular internal cavity size was normal in size. There is mild left ventricular hypertrophy. Left ventricular diastolic parameters are indeterminate.  Right Ventricle: The right ventricular size is normal. No increase in right ventricular wall thickness. Right ventricular systolic function is normal.  Left Atrium: Left atrial size was severely dilated.  Right Atrium: Right atrial size was moderately dilated.  Pericardium: There is no evidence of pericardial effusion.  Mitral Valve: Rheuamtic MV with mean gradient 14 peak 22.4 mmHg at HR 83 bpm and MVA 1.03 cm2. The mitral valve is rheumatic. Trivial mitral valve regurgitation. Moderate to severe mitral valve stenosis. MV peak gradient, 22.4 mmHg. The mean mitral valve gradient is 14.0 mmHg.  Tricuspid Valve: The tricuspid valve is normal in structure. Tricuspid valve regurgitation is mild . No evidence of tricuspid stenosis.  Aortic Valve: The aortic valve is tricuspid. There  is moderate calcification of the aortic valve. There is moderate thickening of the aortic valve. Aortic valve regurgitation is mild. Aortic valve sclerosis is present, with no evidence of aortic valve stenosis. Aortic valve mean gradient measures 9.0 mmHg. Aortic valve peak gradient measures 15.4 mmHg. Aortic valve area, by VTI measures 1.14 cm.  Pulmonic Valve: The pulmonic valve was normal in structure. Pulmonic valve regurgitation is not visualized. No evidence of pulmonic stenosis.  Aorta: The aortic root is normal in size and structure.  Venous: The inferior vena cava is normal in size with greater than 50% respiratory variability, suggesting right atrial pressure of 3 mmHg.  IAS/Shunts: No atrial level shunt detected by color flow Doppler.  Additional Comments: 3D was performed not requiring image post processing on an independent workstation and was indeterminate.   LEFT VENTRICLE PLAX 2D LVIDd:          4.10 cm   Diastology LVIDs:         3.20 cm   LV e' medial:   8.16 cm/s LV PW:         1.30 cm   LV E/e' medial: 27.0 LV IVS:        1.30 cm LVOT diam:     1.90 cm LV SV:         41 LV SV Index:   19 LVOT Area:     2.84 cm   IVC IVC diam: 2.10 cm  LEFT ATRIUM              Index         RIGHT ATRIUM           Index LA diam:        6.40 cm  2.91 cm/m    RA Area:     32.90 cm LA Vol (A2C):   258.0 ml 117.36 ml/m  RA Volume:   107.00 ml 48.67 ml/m LA Vol (A4C):   266.0 ml 121.00 ml/m LA Biplane Vol: 268.0 ml 121.91 ml/m AORTIC VALVE AV Area (Vmax):    1.27 cm AV Area (Vmean):   1.22 cm AV Area (VTI):     1.14 cm AV Vmax:           196.00 cm/s AV Vmean:          142.000 cm/s AV VTI:            0.363 m AV Peak Grad:      15.4 mmHg AV Mean Grad:      9.0 mmHg LVOT Vmax:         87.70 cm/s LVOT Vmean:        60.950 cm/s LVOT VTI:          0.146 m LVOT/AV VTI ratio: 0.40  AORTA Ao Root diam: 2.70 cm Ao Asc diam:  3.50 cm  MITRAL VALVE MV Area (PHT): 1.03 cm     SHUNTS MV Area VTI:   0.71 cm     Systemic VTI:  0.15 m MV Peak grad:  22.4 mmHg    Systemic Diam: 1.90 cm MV Mean grad:  14.0 mmHg MV Vmax:       2.37 m/s MV Vmean:      180.0 cm/s MV Decel Time: 733 msec MV E velocity: 220.00 cm/s  Maude Emmer MD Electronically signed by Maude Emmer MD Signature Date/Time: 05/31/2023/12:27:33 PM    Final          ______________________________________________________________________________________________     Recent Labs: 05/31/2023: ALT 19;  TSH 1.045 06/08/2023: B Natriuretic Peptide 144.9; Hemoglobin 14.9; Magnesium  2.4; Platelets 209 06/28/2023: BUN 28; Creatinine, Ser 1.29; Potassium 4.4; Sodium 139  Recent Lipid Panel    Component Value Date/Time   CHOL  06/04/2010 0359    139        ATP III CLASSIFICATION:  <200     mg/dL   Desirable  799-760  mg/dL   Borderline High  >=759    mg/dL   High          TRIG 71 06/04/2010 0359   HDL 33 (L)  06/04/2010 0359   CHOLHDL 4.2 06/04/2010 0359   VLDL 14 06/04/2010 0359   LDLCALC  06/04/2010 0359    92        Total Cholesterol/HDL:CHD Risk Coronary Heart Disease Risk Table                     Men   Women  1/2 Average Risk   3.4   3.3  Average Risk       5.0   4.4  2 X Average Risk   9.6   7.1  3 X Average Risk  23.4   11.0        Use the calculated Patient Ratio above and the CHD Risk Table to determine the patient's CHD Risk.        ATP III CLASSIFICATION (LDL):  <100     mg/dL   Optimal  899-870  mg/dL   Near or Above                    Optimal  130-159  mg/dL   Borderline  839-810  mg/dL   High  >809     mg/dL   Very High    History of Present Illness    78 year old female with the above past medical history including heart failure with improved EF, permanent atrial fibrillation on chronic Coumadin  therapy, mitral valve stenosis, hypertension, hyperlipidemia, history of CVA, history of DVT/PE, lupus anticoagulant syndrome, and CKD stage IIIa.   Prior echocardiogram in 2023 showed EF 45 to 50%, mild to moderate mitral valve regurgitation, moderate to severe mitral stenosis.  She was hospitalized in March 2025 in the setting of acute respiratory failure, atrial fibrillation with RVR.  Echocardiogram showed EF 55%, mild LVH, normal RV size and function, severely dilated left atria, rheumatic mitral valve with trivial MR, moderate to severe mitral stenosis.  Right heart catheterization on 06/07/2023 showed pressures consistent with mild pulmonary arterial hypertension possibly due to vascular remodeling from longstanding mitral valve disease.  She had low cardiac output in the setting of normal EF.  She was diuresed and discharged home in stable condition.  Of note, she has not been felt to be a surgical candidate for her mitral valve disorder.  Spironolactone  and Entresto  were discontinued in the setting of hyperkalemia.  She was last seen in the office on 09/28/2023 and was stable  from a cardiac standpoint.  BP was elevated.  She was started on amlodipine .   She presents today for follow-up.  Since her last visit she has done well from a cardiac standpoint.  Her BP has been well-controlled.  She denies any chest pain, palpitations, dizziness, dyspnea, edema, PND, orthopnea, weight gain.  Overall, she reports feeling well.  Home Medications    Current Outpatient Medications  Medication Sig Dispense Refill   acetaminophen  (TYLENOL ) 500 MG tablet Take 2 tablets (1,000 mg total) by mouth 3 (  three) times daily. (Patient taking differently: Take 1,000 mg by mouth every 8 (eight) hours as needed for mild pain (pain score 1-3).) 30 tablet 0   amLODipine  (NORVASC ) 5 MG tablet Take 1 tablet (5 mg total) by mouth daily. 90 tablet 3   Calcium  Carb-Cholecalciferol  (CALCIUM  600 + D PO) Take 1 tablet by mouth 2 (two) times daily.     Cholecalciferol  (VITAMIN D ) 50 MCG (2000 UT) tablet Take 2,000 Units by mouth daily.     FLUoxetine  (PROZAC ) 20 MG capsule Take 20 mg by mouth daily as needed (for anxiety).     furosemide  (LASIX ) 40 MG tablet Take 1 tablet (40 mg total) by mouth daily as needed for fluid. 90 tablet 3   metoprolol  tartrate (LOPRESSOR ) 25 MG tablet Take 1 tablet (25 mg total) by mouth 2 (two) times daily. 60 tablet 2   warfarin (COUMADIN ) 3 MG tablet Take 1-1.5 tablets (3-4.5 mg total) by mouth See admin instructions. Take 1 tablet by mouth every Mon, Wed, Fri, Sun and take 1 and 1/2 tablets all other days OR AS DIRECTED BY COUMADIN  CLINIC; NEEDS INR CHECK CALL OFFICE     No current facility-administered medications for this visit.     Review of Systems    She denies chest pain, palpitations, dyspnea, pnd, orthopnea, n, v, dizziness, syncope, edema, weight gain, or early satiety. All other systems reviewed and are otherwise negative except as noted above.   Physical Exam    VS:  BP 126/72   Pulse 91   Ht 5' 6 (1.676 m)   Wt 228 lb 9.6 oz (103.7 kg)   SpO2 93%    BMI 36.90 kg/m   GEN: Well nourished, well developed, in no acute distress. HEENT: normal. Neck: Supple, no JVD, carotid bruits, or masses. Cardiac: IRIR, no murmurs, rubs, or gallops. No clubbing, cyanosis, edema.  Radials/DP/PT 2+ and equal bilaterally.  Respiratory:  Respirations regular and unlabored, clear to auscultation bilaterally. GI: Soft, nontender, nondistended, BS + x 4. MS: no deformity or atrophy. Skin: warm and dry, no rash. Neuro:  Strength and sensation are intact. Psych: Normal affect.  Accessory Clinical Findings    ECG personally reviewed by me today -    - no EKG in office today.   Lab Results  Component Value Date   WBC 7.7 06/08/2023   HGB 14.9 06/08/2023   HCT 45.2 06/08/2023   MCV 96.4 06/08/2023   PLT 209 06/08/2023   Lab Results  Component Value Date   CREATININE 1.29 (H) 06/28/2023   BUN 28 (H) 06/28/2023   NA 139 06/28/2023   K 4.4 06/28/2023   CL 105 06/28/2023   CO2 20 06/28/2023   Lab Results  Component Value Date   ALT 19 05/31/2023   AST 26 05/31/2023   ALKPHOS 100 05/31/2023   BILITOT 1.3 (H) 05/31/2023   Lab Results  Component Value Date   CHOL  06/04/2010    139        ATP III CLASSIFICATION:  <200     mg/dL   Desirable  799-760  mg/dL   Borderline High  >=759    mg/dL   High          HDL 33 (L) 06/04/2010   LDLCALC  06/04/2010    92        Total Cholesterol/HDL:CHD Risk Coronary Heart Disease Risk Table  Men   Women  1/2 Average Risk   3.4   3.3  Average Risk       5.0   4.4  2 X Average Risk   9.6   7.1  3 X Average Risk  23.4   11.0        Use the calculated Patient Ratio above and the CHD Risk Table to determine the patient's CHD Risk.        ATP III CLASSIFICATION (LDL):  <100     mg/dL   Optimal  899-870  mg/dL   Near or Above                    Optimal  130-159  mg/dL   Borderline  839-810  mg/dL   High  >809     mg/dL   Very High   TRIG 71 96/90/7987   CHOLHDL 4.2 06/04/2010     Lab Results  Component Value Date   HGBA1C 5.8 (H) 05/31/2023    Assessment & Plan   1. Hypertension: BP well controlled.  Improved with addition of amlodipine .  Continue current antihypertensive regimen.   2. Heart failure with improved EF: Prior echocardiogram in 2023 showed EF 45 to 50%, mild to moderate mitral valve regurgitation, moderate to severe mitral stenosis. Echocardiogram in 05/2023 showed EF 55%, mild LVH, normal RV size and function, severely dilated left atrium, rheumatic mitral valve with trivial MR, moderate to severe mitral stenosis.  Right heart catheterization on 06/07/2023 showed pressures consistent with mild pulmonary arterial hypertension possibly due to vascular remodeling from longstanding mitral valve disease.  Euvolemic and well compensated on exam. Spironolactone  and Entresto  were discontinued recently in the setting of hyperkalemia. Will update BMET today.  Continue metoprolol , Lasix .  3. Permanent atrial fibrillation: Rate controlled.  Denies any palpitations, denies bleeding.  Continue metoprolol , warfarin.  4. Mitral valve stenosis: Moderate to severe on most recent echo, trivial MR.  Prior notes indicate she is not felt to be a surgical candidate.  Continue Lasix .   5. History of CVA: Continue warfarin.   6. History of DVT/PE/Lupus anticoagulant syndrome: Continue warfarin.   7 CKD stage IIIa: Creatinine was stable at 1.29 in 06/2023.  Repeat BMET pending as above.   8. Disposition:  Follow-up in 6 months, sooner if needed.      Damien JAYSON Braver, NP 11/08/2023, 8:41 AM

## 2023-11-08 NOTE — Patient Instructions (Signed)
 Medication Instructions:  Your physician recommends that you continue on your current medications as directed. Please refer to the Current Medication list given to you today.  *If you need a refill on your cardiac medications before your next appointment, please call your pharmacy*  Lab Work: BMET today  Testing/Procedures: NONE ordered at this time of appointment   Follow-Up: At [facility], you and your health needs are our priority.  As part of our continuing mission to provide you with exceptional heart care, our providers are all part of one team.  This team includes your primary Cardiologist (physician) and Advanced Practice Providers or APPs (Physician Assistants and Nurse Practitioners) who all work together to provide you with the care you need, when you need it.  Your next appointment:   6 month(s)  Provider:   Lynwood Schilling, MD or Damien Braver, NP          We recommend signing up for the patient portal called MyChart.  Sign up information is provided on this After Visit Summary.  MyChart is used to connect with patients for Virtual Visits (Telemedicine).  Patients are able to view lab/test results, encounter notes, upcoming appointments, etc.  Non-urgent messages can be sent to your provider as well.   To learn more about what you can do with MyChart, go to [email or web address].

## 2023-11-09 ENCOUNTER — Ambulatory Visit: Payer: Self-pay | Admitting: Nurse Practitioner

## 2023-11-09 LAB — BASIC METABOLIC PANEL WITH GFR
BUN/Creatinine Ratio: 11 — ABNORMAL LOW (ref 12–28)
BUN: 10 mg/dL (ref 8–27)
CO2: 24 mmol/L (ref 20–29)
Calcium: 9 mg/dL (ref 8.7–10.3)
Chloride: 98 mmol/L (ref 96–106)
Creatinine, Ser: 0.87 mg/dL (ref 0.57–1.00)
Glucose: 94 mg/dL (ref 70–99)
Potassium: 4.4 mmol/L (ref 3.5–5.2)
Sodium: 137 mmol/L (ref 134–144)
eGFR: 68 mL/min/1.73 (ref >59)

## 2023-12-11 DIAGNOSIS — Z7901 Long term (current) use of anticoagulants: Secondary | ICD-10-CM | POA: Diagnosis not present

## 2024-01-01 ENCOUNTER — Other Ambulatory Visit: Payer: PRIVATE HEALTH INSURANCE

## 2024-01-15 DIAGNOSIS — Z7901 Long term (current) use of anticoagulants: Secondary | ICD-10-CM | POA: Diagnosis not present

## 2024-01-22 DIAGNOSIS — Z7901 Long term (current) use of anticoagulants: Secondary | ICD-10-CM | POA: Diagnosis not present
